# Patient Record
Sex: Male | Born: 1935
Health system: Southern US, Community
[De-identification: ages and names within clinical notes are randomized; demographics above are authoritative.]

## PROBLEM LIST (undated history)

## (undated) DIAGNOSIS — N183 Chronic kidney disease, stage 3 unspecified: Secondary | ICD-10-CM

## (undated) DIAGNOSIS — H9191 Unspecified hearing loss, right ear: Secondary | ICD-10-CM

## (undated) DIAGNOSIS — I10 Essential (primary) hypertension: Secondary | ICD-10-CM

## (undated) DIAGNOSIS — M316 Other giant cell arteritis: Secondary | ICD-10-CM

## (undated) DIAGNOSIS — I5022 Chronic systolic (congestive) heart failure: Secondary | ICD-10-CM

## (undated) DIAGNOSIS — I2510 Atherosclerotic heart disease of native coronary artery without angina pectoris: Secondary | ICD-10-CM

## (undated) DIAGNOSIS — R2689 Other abnormalities of gait and mobility: Secondary | ICD-10-CM

## (undated) DIAGNOSIS — E785 Hyperlipidemia, unspecified: Secondary | ICD-10-CM

## (undated) DIAGNOSIS — M1990 Unspecified osteoarthritis, unspecified site: Secondary | ICD-10-CM

## (undated) DIAGNOSIS — I471 Supraventricular tachycardia, unspecified: Secondary | ICD-10-CM

## (undated) DIAGNOSIS — R9431 Abnormal electrocardiogram [ECG] [EKG]: Secondary | ICD-10-CM

## (undated) DIAGNOSIS — R32 Unspecified urinary incontinence: Secondary | ICD-10-CM

## (undated) DIAGNOSIS — R319 Hematuria, unspecified: Secondary | ICD-10-CM

## (undated) HISTORY — PX: CATARACT EXTRACTION: SUR2

## (undated) HISTORY — DX: Chronic kidney disease, stage 3 unspecified: N18.30

## (undated) HISTORY — DX: Abnormal electrocardiogram (ECG) (EKG): R94.31

## (undated) HISTORY — DX: Atherosclerotic heart disease of native coronary artery without angina pectoris: I25.10

## (undated) HISTORY — DX: Hematuria, unspecified: R31.9

## (undated) HISTORY — DX: Unspecified hearing loss, right ear: H91.91

## (undated) HISTORY — DX: Chronic systolic (congestive) heart failure: I50.22

## (undated) HISTORY — DX: Supraventricular tachycardia: I47.1

## (undated) HISTORY — DX: Supraventricular tachycardia, unspecified: I47.10

## (undated) HISTORY — PX: BELPHAROPTOSIS REPAIR: SHX369

## (undated) HISTORY — PX: KNEE SURGERY: SHX244

## (undated) HISTORY — DX: Hyperlipidemia, unspecified: E78.5

---

## 2006-10-11 ENCOUNTER — Ambulatory Visit (HOSPITAL_COMMUNITY): Admission: RE | Admit: 2006-10-11 | Discharge: 2006-10-11 | Payer: Self-pay | Admitting: Family Medicine

## 2007-12-11 ENCOUNTER — Ambulatory Visit: Payer: Self-pay | Admitting: Internal Medicine

## 2007-12-19 ENCOUNTER — Ambulatory Visit: Payer: Self-pay | Admitting: Internal Medicine

## 2007-12-19 ENCOUNTER — Ambulatory Visit (HOSPITAL_COMMUNITY): Admission: RE | Admit: 2007-12-19 | Discharge: 2007-12-19 | Payer: Self-pay | Admitting: Internal Medicine

## 2007-12-19 HISTORY — PX: COLONOSCOPY: SHX174

## 2008-01-08 ENCOUNTER — Ambulatory Visit (HOSPITAL_COMMUNITY): Admission: RE | Admit: 2008-01-08 | Discharge: 2008-01-08 | Payer: Self-pay | Admitting: Family Medicine

## 2008-10-25 HISTORY — PX: BACK SURGERY: SHX140

## 2010-07-24 ENCOUNTER — Ambulatory Visit (HOSPITAL_COMMUNITY): Admission: RE | Admit: 2010-07-24 | Discharge: 2010-07-24 | Payer: Self-pay | Admitting: Family Medicine

## 2010-08-18 ENCOUNTER — Ambulatory Visit (HOSPITAL_COMMUNITY)
Admission: RE | Admit: 2010-08-18 | Discharge: 2010-08-18 | Payer: Self-pay | Source: Home / Self Care | Admitting: Family Medicine

## 2011-03-09 NOTE — Op Note (Signed)
NAME:  Gerald Hall, Gerald Hall                ACCOUNT NO.:  [id number]   MEDICAL RECORD NO.:  [id number]          PATIENT TYPE:  AMB   LOCATION:  DAY                           FACILITY:  [facility]   PHYSICIAN:  R. Roetta Sessions, M.D. DATE OF BIRTH:  02-12-1935   DATE OF PROCEDURE:  12/19/2007  DATE OF DISCHARGE:                               OPERATIVE REPORT   PROCEDURE:  High-risk screening ileal colonoscopy.   ENDOSCOPIST:  Dr. Jonathon Bellows.   INDICATIONS FOR PROCEDURE:  This 76 year old gentleman with a positive  family history of colon cancer at a relatively young age.  His last  colonoscopy was in 2002.  He was found to have external hemorrhoids  only.  He does not have any symptoms currently.  A colonoscopy is now  being done as a high-risk screening procedure.  This approach has been  discussed with the patient at length, the potential risks, benefits and  alternatives have been reviewed.  Questions answered.  Please see  documentation in the medical record for the procedure note.   DESCRIPTION OF PROCEDURE:  The O2 saturation, blood pressure, pulse and  respirations were monitored throughout the entirety of the procedure.  Conscious sedation with Versed 4 mg IV and Demerol 75 mg IV in divided  dose.  The instrument was the Pentax video-chip system.   FINDINGS:  A digital rectal exam revealed prominent external  hemorrhoids, otherwise unremarkable.  Endoscopic findings revealed the  prep was good.   COLON:  The colonic mucosa was surveyed from the rectosigmoid junction  to the left, transverse and right colon, to the area of the appendiceal  orifice, the ileocecal valve and cecum.  These structures were well seen  and photographed for the record.  The terminal ileum was then seen at 10  cm.  From this the scope was slowly withdrawn and all previously-  mentioned mucosal surfaces were again seen.  The colonic mucosa as well  as the terminal ileal mucosa appeared normal.  The scope was  pulled down  to the rectum where a thorough examination of the rectal mucosa,  including a retroflexed view of the  anal verge demonstrated no  abnormalities.   The patient tolerated the procedure well and was reactive in endoscopy.   IMPRESSION:  1. External hemorrhoids, otherwise normal rectum.  2. Normal colon.  3. Normal terminal ileum.   RECOMMENDATIONS:  Repeat screening colonoscopy in five years.      Jonathon Bellows, M.D.  Electronically Signed     RMR/MEDQ  D:  12/19/2007  T:  12/19/2007  Job:  [id number]   cc:   Reynolds Bowl, M.D.  [place], [place]

## 2011-03-09 NOTE — Consult Note (Signed)
NAME:  Gerald Hall, Gerald Hall                ACCOUNT NO.:  [id number]   MEDICAL RECORD NO.:  [id number]          PATIENT TYPE:  AMB   LOCATION:  DAY                           FACILITY:  [facility]   PHYSICIAN:  R. Roetta Sessions, M.D. DATE OF BIRTH:  12-29-1934   DATE OF CONSULTATION:  12/11/2007  DATE OF DISCHARGE:                                 CONSULTATION   REASON FOR CONSULTATION:  Family history of colon cancer.   HISTORY OF PRESENT ILLNESS:  Gerald Hall is a 76 year old male.  He has a  family history of colon cancer given the fact that his brother was  diagnosed at age 52.  His last colonoscopy was by Dr. Jena Gauss on  November 21, 2000.  He was found to have external hemorrhoids and a presumed  fissure.  He was sent to Dr. Lovell Sheehan for surgical evaluation.  He denies  any rectal bleeding or melena.  He has had left lower quadrant pain  which is intermittent and worsens with movement.  He attributes this to  his history of spinal stenosis.  He does have 4-5 loose stools a day.  This has been his baseline for 15 years or more.  He notes if he skips  his morning coffee, he is less likely to have loose stools.  He denies  any anorexia.  Denies any dysphagia or odynophagia.  Denies any  heartburn or indigestion.  His weight is up about 5 pounds in the last  year.   PAST MEDICAL/SURGICAL HISTORY:  1. Hypertension.  2. Hypercholesterolemia.  3. Spinal stenosis.  4. He was seen and treated by Dr. Lovell Sheehan for anal fissure.  5. He had a colonoscopy by Dr. Jena Gauss November 21, 2000 and was found to      have external hemorrhoids and suspected anal fissure.  6. He had a hyperplastic polyp on colonoscopy prior to this one on      July 31, 1996.   CURRENT MEDICATIONS:  1. Lipitor 10 mg daily.  2. Triamterene/HCTZ 37.5/25 mg daily.  3. Ibuprofen 800 mg b.i.d.  4. Aspirin 81 mg daily.  5. Benadryl q.h.s.   ALLERGIES:  NO KNOWN DRUG ALLERGIES.   FAMILY HISTORY:  Brother diagnosed with colon cancer in  his early 14s.  Mother deceased in her 46s secondary to coronary artery disease.  Father  deceased at 43 secondary to cerebral hemorrhage.  One sister deceased  secondary to rheumatic fever.  Three brothers deceased with history of  diabetes mellitus, colorectal carcinoma and old age.   SOCIAL HISTORY:  Gerald Hall is married.  He has 2 healthy daughters.  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  He has a 40  pack year history of tobacco use.  He occasionally consumes alcohol a  couple times per month and denies any drug use.   REVIEW OF SYSTEMS:  See HPI.   PHYSICAL EXAMINATION:  VITAL SIGNS:  Weight 190.5 pounds, 70 inches,  temperature 98.4, blood pressure 140/90 and pulse 80.  GENERAL:  He is well-developed, well-nourished elderly male in no acute  distress.  HEENT.  Sclerae are clear.  Conjunctivae are nonicteric.  Oropharynx pink and moist without any lesions.  NECK:  Supple without thyromegaly.  CHEST/HEART:  Regular rate and rhythm.  Normal S1-S2.  No rubs, clicks,  rubs or gallops.  LUNGS:  Clear to auscultation bilaterally.  ABDOMEN:  Positive bowel sounds x4.  No bruits auscultated.  Soft,  nontender and nondistended without palpable mass or hepatosplenomegaly.  No rebound, tenderness, guarding.  EXTREMITIES:  Without clubbing or edema bilaterally.   IMPRESSION:  Gerald Hall is a 76 year old male with a family history of  colon cancer overdue for high-risk screening colonoscopy.  No real GI  complaints.  He does have some occasional left lower quadrant abdominal  pain that mostly centers around his left hip, worsened with movement and  suspected to be related to his history of spinal stenosis.   PLAN:  Colonoscopy with Dr. Jena Gauss in the near future.  Discussed the  procedure including risks and benefits included but not limited to  bleeding, infection, perforation, drug reaction.  He agrees with the plan and consent will be obtained.   We would like to  thank Dr. Jorene Guest for allowing [place] to participate in  the care of Gerald Hall.      Lorenza Burton, N.P.      Jonathon Bellows, M.D.  Electronically Signed    KJ/MEDQ  D:  12/12/2007  T:  12/12/2007  Job:  [id number]

## 2011-11-02 DIAGNOSIS — N183 Chronic kidney disease, stage 3 unspecified: Secondary | ICD-10-CM | POA: Diagnosis not present

## 2011-11-02 DIAGNOSIS — I70219 Atherosclerosis of native arteries of extremities with intermittent claudication, unspecified extremity: Secondary | ICD-10-CM | POA: Diagnosis not present

## 2011-11-02 DIAGNOSIS — I10 Essential (primary) hypertension: Secondary | ICD-10-CM | POA: Diagnosis not present

## 2011-11-05 DIAGNOSIS — I739 Peripheral vascular disease, unspecified: Secondary | ICD-10-CM | POA: Diagnosis not present

## 2011-11-05 DIAGNOSIS — I700 Atherosclerosis of aorta: Secondary | ICD-10-CM | POA: Diagnosis not present

## 2011-11-05 DIAGNOSIS — F17200 Nicotine dependence, unspecified, uncomplicated: Secondary | ICD-10-CM | POA: Diagnosis not present

## 2011-11-05 DIAGNOSIS — I70219 Atherosclerosis of native arteries of extremities with intermittent claudication, unspecified extremity: Secondary | ICD-10-CM | POA: Diagnosis not present

## 2011-11-05 DIAGNOSIS — Z87891 Personal history of nicotine dependence: Secondary | ICD-10-CM | POA: Diagnosis not present

## 2011-11-08 DIAGNOSIS — I789 Disease of capillaries, unspecified: Secondary | ICD-10-CM | POA: Diagnosis not present

## 2011-11-08 DIAGNOSIS — C44319 Basal cell carcinoma of skin of other parts of face: Secondary | ICD-10-CM | POA: Diagnosis not present

## 2011-11-08 DIAGNOSIS — L905 Scar conditions and fibrosis of skin: Secondary | ICD-10-CM | POA: Diagnosis not present

## 2011-11-08 DIAGNOSIS — C44310 Basal cell carcinoma of skin of unspecified parts of face: Secondary | ICD-10-CM | POA: Insufficient documentation

## 2011-11-26 DIAGNOSIS — M48061 Spinal stenosis, lumbar region without neurogenic claudication: Secondary | ICD-10-CM | POA: Diagnosis not present

## 2011-11-26 DIAGNOSIS — M25569 Pain in unspecified knee: Secondary | ICD-10-CM | POA: Diagnosis not present

## 2011-11-30 DIAGNOSIS — M48061 Spinal stenosis, lumbar region without neurogenic claudication: Secondary | ICD-10-CM | POA: Diagnosis not present

## 2011-12-02 DIAGNOSIS — M48061 Spinal stenosis, lumbar region without neurogenic claudication: Secondary | ICD-10-CM | POA: Diagnosis not present

## 2011-12-07 DIAGNOSIS — M48061 Spinal stenosis, lumbar region without neurogenic claudication: Secondary | ICD-10-CM | POA: Diagnosis not present

## 2011-12-09 DIAGNOSIS — M48061 Spinal stenosis, lumbar region without neurogenic claudication: Secondary | ICD-10-CM | POA: Diagnosis not present

## 2011-12-14 DIAGNOSIS — M48061 Spinal stenosis, lumbar region without neurogenic claudication: Secondary | ICD-10-CM | POA: Diagnosis not present

## 2011-12-16 DIAGNOSIS — M48061 Spinal stenosis, lumbar region without neurogenic claudication: Secondary | ICD-10-CM | POA: Diagnosis not present

## 2011-12-16 DIAGNOSIS — M25569 Pain in unspecified knee: Secondary | ICD-10-CM | POA: Diagnosis not present

## 2011-12-21 DIAGNOSIS — M25569 Pain in unspecified knee: Secondary | ICD-10-CM | POA: Diagnosis not present

## 2011-12-21 DIAGNOSIS — M48061 Spinal stenosis, lumbar region without neurogenic claudication: Secondary | ICD-10-CM | POA: Diagnosis not present

## 2012-04-06 DIAGNOSIS — M48061 Spinal stenosis, lumbar region without neurogenic claudication: Secondary | ICD-10-CM | POA: Diagnosis not present

## 2012-04-06 DIAGNOSIS — M1710 Unilateral primary osteoarthritis, unspecified knee: Secondary | ICD-10-CM | POA: Diagnosis not present

## 2012-04-06 DIAGNOSIS — I10 Essential (primary) hypertension: Secondary | ICD-10-CM | POA: Diagnosis not present

## 2012-05-15 DIAGNOSIS — M659 Synovitis and tenosynovitis, unspecified: Secondary | ICD-10-CM | POA: Diagnosis not present

## 2012-05-15 DIAGNOSIS — M1710 Unilateral primary osteoarthritis, unspecified knee: Secondary | ICD-10-CM | POA: Diagnosis not present

## 2012-05-15 DIAGNOSIS — M25569 Pain in unspecified knee: Secondary | ICD-10-CM | POA: Diagnosis not present

## 2012-05-15 DIAGNOSIS — M545 Low back pain: Secondary | ICD-10-CM | POA: Diagnosis not present

## 2012-05-25 DIAGNOSIS — M659 Synovitis and tenosynovitis, unspecified: Secondary | ICD-10-CM | POA: Diagnosis not present

## 2012-05-25 DIAGNOSIS — M25569 Pain in unspecified knee: Secondary | ICD-10-CM | POA: Diagnosis not present

## 2012-05-25 DIAGNOSIS — M1710 Unilateral primary osteoarthritis, unspecified knee: Secondary | ICD-10-CM | POA: Diagnosis not present

## 2012-07-26 DIAGNOSIS — Z23 Encounter for immunization: Secondary | ICD-10-CM | POA: Diagnosis not present

## 2012-08-25 DIAGNOSIS — N400 Enlarged prostate without lower urinary tract symptoms: Secondary | ICD-10-CM | POA: Diagnosis not present

## 2012-10-24 DIAGNOSIS — H2510 Age-related nuclear cataract, unspecified eye: Secondary | ICD-10-CM | POA: Diagnosis not present

## 2012-11-23 ENCOUNTER — Encounter: Payer: Self-pay | Admitting: Internal Medicine

## 2012-12-04 DIAGNOSIS — Z87891 Personal history of nicotine dependence: Secondary | ICD-10-CM | POA: Diagnosis not present

## 2012-12-04 DIAGNOSIS — I70219 Atherosclerosis of native arteries of extremities with intermittent claudication, unspecified extremity: Secondary | ICD-10-CM | POA: Diagnosis not present

## 2012-12-04 DIAGNOSIS — I10 Essential (primary) hypertension: Secondary | ICD-10-CM | POA: Diagnosis not present

## 2012-12-26 DIAGNOSIS — M25569 Pain in unspecified knee: Secondary | ICD-10-CM | POA: Diagnosis not present

## 2012-12-26 DIAGNOSIS — M1710 Unilateral primary osteoarthritis, unspecified knee: Secondary | ICD-10-CM | POA: Diagnosis not present

## 2012-12-26 DIAGNOSIS — M659 Synovitis and tenosynovitis, unspecified: Secondary | ICD-10-CM | POA: Diagnosis not present

## 2013-01-09 DIAGNOSIS — M25569 Pain in unspecified knee: Secondary | ICD-10-CM | POA: Diagnosis not present

## 2013-01-09 DIAGNOSIS — M659 Synovitis and tenosynovitis, unspecified: Secondary | ICD-10-CM | POA: Diagnosis not present

## 2013-09-03 DIAGNOSIS — Z23 Encounter for immunization: Secondary | ICD-10-CM | POA: Diagnosis not present

## 2013-09-11 ENCOUNTER — Ambulatory Visit (INDEPENDENT_AMBULATORY_CARE_PROVIDER_SITE_OTHER): Payer: Medicare Other | Admitting: Family Medicine

## 2013-09-11 ENCOUNTER — Encounter: Payer: Self-pay | Admitting: Family Medicine

## 2013-09-11 VITALS — BP 130/74 | Ht 69.5 in | Wt 174.0 lb

## 2013-09-11 DIAGNOSIS — Z0000 Encounter for general adult medical examination without abnormal findings: Secondary | ICD-10-CM

## 2013-09-11 DIAGNOSIS — Z79899 Other long term (current) drug therapy: Secondary | ICD-10-CM

## 2013-09-11 DIAGNOSIS — F17200 Nicotine dependence, unspecified, uncomplicated: Secondary | ICD-10-CM | POA: Diagnosis not present

## 2013-09-11 DIAGNOSIS — E782 Mixed hyperlipidemia: Secondary | ICD-10-CM

## 2013-09-11 MED ORDER — GABAPENTIN 100 MG PO CAPS: 100.0000 mg | ORAL_CAPSULE | Freq: Two times a day (BID) | ORAL | Status: DC

## 2013-09-11 MED ORDER — FINASTERIDE 5 MG PO TABS: 5.0000 mg | ORAL_TABLET | Freq: Every day | ORAL | Status: DC

## 2013-09-11 MED ORDER — TRIAMTERENE-HCTZ 37.5-25 MG PO CAPS: 1.0000 | ORAL_CAPSULE | Freq: Every day | ORAL | Status: DC

## 2013-09-11 MED ORDER — FLUTICASONE PROPIONATE 50 MCG/ACT NA SUSP: 2.0000 | Freq: Every day | NASAL | Status: DC

## 2013-09-11 NOTE — Progress Notes (Signed)
**Note Gerald-Identified via Obfuscation**   Subjective:    Patient ID: Gerald Hall, male    DOB: [birth year], 78 y.o.   MRN: [id number]  HPIHere for a med check. Needs refills. Discuss side effects of pravastatin.  Patient had a colonoscopy about 5 years ago is due for another colonoscopy due to increased family risk he states he will contact Dr. Kendell Bane  Hx spinal stenosis Hx hyperlipidemia Stopped pravastatin-  Possibly better with memory No hx MI  Review of Systems  Constitutional: Negative for fever, activity change, appetite change and fatigue.  HENT: Negative for rhinorrhea and sinus pressure.   Respiratory: Negative for cough, choking and chest tightness.   Cardiovascular: Negative for chest pain.  Gastrointestinal: Negative for abdominal pain.  Hematological: Negative for adenopathy.       Objective:   Physical Exam  Vitals reviewed. Constitutional: He appears well-developed and well-nourished.  HENT:  Head: Normocephalic and atraumatic.  Neck: Neck supple.  Cardiovascular: Normal rate, regular rhythm and normal heart sounds.   No murmur heard. Pulmonary/Chest: Effort normal and breath sounds normal. No respiratory distress.  Abdominal: Soft.  Musculoskeletal: Normal range of motion.  Lymphadenopathy:    He has no cervical adenopathy.  Neurological: He is alert.  Skin: Skin is warm and dry. No erythema.          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

## 2013-09-13 DIAGNOSIS — E782 Mixed hyperlipidemia: Secondary | ICD-10-CM | POA: Diagnosis not present

## 2013-09-13 DIAGNOSIS — Z79899 Other long term (current) drug therapy: Secondary | ICD-10-CM | POA: Diagnosis not present

## 2013-09-14 LAB — BASIC METABOLIC PANEL
BUN: 20 mg/dL (ref 6–23)
Chloride: 102 mEq/L (ref 96–112)
Creat: 1.15 mg/dL (ref 0.50–1.35)
Potassium: 3.4 mEq/L — ABNORMAL LOW (ref 3.5–5.3)
Sodium: 138 mEq/L (ref 135–145)

## 2013-09-14 LAB — HEPATIC FUNCTION PANEL
ALT: 11 U/L (ref 0–53)
Albumin: 3.7 g/dL (ref 3.5–5.2)
Alkaline Phosphatase: 75 U/L (ref 39–117)
Indirect Bilirubin: 0.6 mg/dL (ref 0.0–0.9)
Total Bilirubin: 0.8 mg/dL (ref 0.3–1.2)
Total Protein: 6.3 g/dL (ref 6.0–8.3)

## 2013-09-14 LAB — LIPID PANEL
Cholesterol: 165 mg/dL (ref 0–200)
Triglycerides: 137 mg/dL (ref <150)
VLDL: 27 mg/dL (ref 0–40)

## 2013-09-24 ENCOUNTER — Other Ambulatory Visit: Payer: Self-pay

## 2013-09-24 DIAGNOSIS — R109 Unspecified abdominal pain: Secondary | ICD-10-CM

## 2013-09-24 MED ORDER — POTASSIUM CHLORIDE ER 10 MEQ PO TBCR: 10.0000 meq | EXTENDED_RELEASE_TABLET | Freq: Every day | ORAL | Status: DC

## 2013-09-27 ENCOUNTER — Ambulatory Visit (HOSPITAL_COMMUNITY)
Admission: RE | Admit: 2013-09-27 | Discharge: 2013-09-27 | Disposition: A | Discharge status: Home / Self Care | Payer: Medicare Other | Source: Ambulatory Visit | Attending: Family Medicine | Admitting: Family Medicine

## 2013-09-27 ENCOUNTER — Other Ambulatory Visit: Payer: Self-pay | Admitting: Family Medicine

## 2013-09-27 DIAGNOSIS — R109 Unspecified abdominal pain: Secondary | ICD-10-CM

## 2013-09-27 DIAGNOSIS — E785 Hyperlipidemia, unspecified: Secondary | ICD-10-CM | POA: Diagnosis not present

## 2013-09-27 DIAGNOSIS — Z136 Encounter for screening for cardiovascular disorders: Secondary | ICD-10-CM | POA: Diagnosis not present

## 2013-09-27 DIAGNOSIS — Z87891 Personal history of nicotine dependence: Secondary | ICD-10-CM | POA: Diagnosis not present

## 2013-12-21 DIAGNOSIS — B351 Tinea unguium: Secondary | ICD-10-CM | POA: Diagnosis not present

## 2014-01-29 ENCOUNTER — Other Ambulatory Visit: Payer: Self-pay | Admitting: Ophthalmology

## 2014-01-29 DIAGNOSIS — H2510 Age-related nuclear cataract, unspecified eye: Secondary | ICD-10-CM | POA: Diagnosis not present

## 2014-01-29 DIAGNOSIS — G4452 New daily persistent headache (NDPH): Secondary | ICD-10-CM | POA: Diagnosis not present

## 2014-01-29 LAB — SEDIMENTATION RATE: Erythrocyte Sed Rate: 5 mm/hr (ref 0–20)

## 2014-01-31 ENCOUNTER — Ambulatory Visit (INDEPENDENT_AMBULATORY_CARE_PROVIDER_SITE_OTHER): Payer: Medicare Other | Admitting: Family Medicine

## 2014-01-31 ENCOUNTER — Encounter: Payer: Self-pay | Admitting: Family Medicine

## 2014-01-31 VITALS — BP 128/88 | Temp 98.4°F | Ht 69.5 in | Wt 173.0 lb

## 2014-01-31 DIAGNOSIS — R519 Headache, unspecified: Secondary | ICD-10-CM

## 2014-01-31 DIAGNOSIS — R51 Headache: Secondary | ICD-10-CM

## 2014-01-31 DIAGNOSIS — J322 Chronic ethmoidal sinusitis: Secondary | ICD-10-CM | POA: Diagnosis not present

## 2014-01-31 MED ORDER — CEFPROZIL 500 MG PO TABS: 500.0000 mg | ORAL_TABLET | Freq: Two times a day (BID) | ORAL | Status: DC

## 2014-01-31 NOTE — Progress Notes (Signed)
   Subjective:    Patient ID: Gerald Hall, male    DOB: 1935/09/02, 79 y.o.   MRN: [id number]  Sinusitis This is a new problem. Episode onset: 2 weeks ago. The problem has been waxing and waning since onset. There has been no fever. Associated symptoms include congestion and headaches. (Pressure behind left eye) Past treatments include acetaminophen and spray decongestants. The treatment provided mild relief.   No N or V Patient states that he's been having headaches that last anywhere from 20 minutes to an hour at a time start gradually then they become severe to where it's. Call him for him to function he denies any nausea or vomiting with it. He does states that it has woken him up at night he attributed to his sinuses. He has never had headaches before he denies any unilateral numbness or weakness  Review of Systems  HENT: Positive for congestion.   Neurological: Positive for headaches.   no cough wheezing the nurse.     Objective:   Physical Exam  EOMI cranial nerves normal neck is supple lungs are clear heart is regular subjective discomfort in the frontal sinus on the left side eardrums normal heart regular      Assessment & Plan:  #1 headaches-this could be temporal arteritis but the patient states that his ophthalmologist specialist did a sedimentation rate that was normal I recommend repeating a sedimentation rate again next week. He will get that done either through the ophthalmologist or through [place].  #2 possible sinusitis switched to Cefzil 500 mg twice a day for the next 10 days he  #3 I do not recommend this patient followup in one week's time if he is not significantly improved because his headaches are persistent there are new onset they've been present for the past 2 weeks and are starting to occur at nighttime I would recommend that we do S. MRI on this patient. Currently we will see if this will get better with antibiotics she will followup in one week if the pain  goes away before then he will not need a scan of the does not go away he will need a scan he understands. He understands that this could be a sinus infection temporal arteritis or the possibility of a tumor he understands all of this.

## 2014-02-04 DIAGNOSIS — R05 Cough: Secondary | ICD-10-CM | POA: Diagnosis not present

## 2014-02-04 DIAGNOSIS — R059 Cough, unspecified: Secondary | ICD-10-CM | POA: Diagnosis not present

## 2014-02-04 DIAGNOSIS — M316 Other giant cell arteritis: Secondary | ICD-10-CM | POA: Diagnosis not present

## 2014-02-06 DIAGNOSIS — G4452 New daily persistent headache (NDPH): Secondary | ICD-10-CM | POA: Diagnosis not present

## 2014-02-06 DIAGNOSIS — M316 Other giant cell arteritis: Secondary | ICD-10-CM | POA: Diagnosis not present

## 2014-02-06 DIAGNOSIS — R51 Headache: Secondary | ICD-10-CM | POA: Diagnosis not present

## 2014-02-08 ENCOUNTER — Ambulatory Visit: Payer: Self-pay | Admitting: Family Medicine

## 2014-02-12 DIAGNOSIS — R51 Headache: Secondary | ICD-10-CM | POA: Diagnosis not present

## 2014-02-12 DIAGNOSIS — R7982 Elevated C-reactive protein (CRP): Secondary | ICD-10-CM | POA: Diagnosis not present

## 2014-02-12 DIAGNOSIS — M316 Other giant cell arteritis: Secondary | ICD-10-CM | POA: Diagnosis not present

## 2014-02-13 ENCOUNTER — Telehealth: Payer: Self-pay | Admitting: Family Medicine

## 2014-02-13 DIAGNOSIS — L570 Actinic keratosis: Secondary | ICD-10-CM | POA: Diagnosis not present

## 2014-02-13 DIAGNOSIS — Z1283 Encounter for screening for malignant neoplasm of skin: Secondary | ICD-10-CM | POA: Diagnosis not present

## 2014-02-13 DIAGNOSIS — Z85828 Personal history of other malignant neoplasm of skin: Secondary | ICD-10-CM | POA: Diagnosis not present

## 2014-02-13 NOTE — Telephone Encounter (Signed)
Pt would like to speak with Dr. Nicki Reaper concerning a recent biopsy he had and scheduling a MRI

## 2014-02-14 NOTE — Telephone Encounter (Signed)
First Bx inconclusive, 2nd Bx pending. Pt will call, if 2nd neg then MRI. Pt aware

## 2014-02-15 ENCOUNTER — Telehealth: Payer: Self-pay | Admitting: Family Medicine

## 2014-02-15 DIAGNOSIS — R3129 Other microscopic hematuria: Secondary | ICD-10-CM | POA: Diagnosis not present

## 2014-02-15 DIAGNOSIS — N400 Enlarged prostate without lower urinary tract symptoms: Secondary | ICD-10-CM | POA: Diagnosis not present

## 2014-02-15 DIAGNOSIS — N281 Cyst of kidney, acquired: Secondary | ICD-10-CM | POA: Diagnosis not present

## 2014-02-15 DIAGNOSIS — R51 Headache: Secondary | ICD-10-CM

## 2014-02-15 NOTE — Telephone Encounter (Signed)
Please set this patient up for MRI. Talk with patient when would be best for him. MRI of the brain reason  severe headaches over the past 4 weeks. Wakes him up at nighttime.

## 2014-02-15 NOTE — Telephone Encounter (Signed)
Pt calling to let you know 2nd biopsy came back inconclusive like the first one. Would like to go ahead and schedule MRI. Can go any day except Wednesday.

## 2014-02-15 NOTE — Telephone Encounter (Signed)
Patient has requested that Dr Nicki Reaper call him back regarding scheduling an MRI.

## 2014-02-15 NOTE — Telephone Encounter (Signed)
MRI brain without contrast scheduled [facility] 02/21/14 at 11 am. Register 10:45. Pt notified.

## 2014-02-21 ENCOUNTER — Ambulatory Visit (HOSPITAL_COMMUNITY)
Admission: RE | Admit: 2014-02-21 | Discharge: 2014-02-21 | Disposition: A | Discharge status: Home / Self Care | Payer: Medicare Other | Source: Ambulatory Visit | Attending: Family Medicine | Admitting: Family Medicine

## 2014-02-21 DIAGNOSIS — R51 Headache: Secondary | ICD-10-CM | POA: Diagnosis not present

## 2014-02-21 DIAGNOSIS — I6789 Other cerebrovascular disease: Secondary | ICD-10-CM | POA: Insufficient documentation

## 2014-02-22 NOTE — Progress Notes (Signed)
Patient notified and verbalized understanding of the test results. No further questions. 

## 2014-02-28 ENCOUNTER — Telehealth: Payer: Self-pay | Admitting: Family Medicine

## 2014-02-28 NOTE — Telephone Encounter (Signed)
Patient needs a copy of his MRI faxed that he had last Thursday.  Dr. Leeanne Mannan. Fax # [phone]

## 2014-03-01 DIAGNOSIS — M79609 Pain in unspecified limb: Secondary | ICD-10-CM | POA: Diagnosis not present

## 2014-03-01 DIAGNOSIS — B351 Tinea unguium: Secondary | ICD-10-CM | POA: Diagnosis not present

## 2014-03-01 DIAGNOSIS — Q828 Other specified congenital malformations of skin: Secondary | ICD-10-CM | POA: Diagnosis not present

## 2014-03-05 DIAGNOSIS — M519 Unspecified thoracic, thoracolumbar and lumbosacral intervertebral disc disorder: Secondary | ICD-10-CM | POA: Insufficient documentation

## 2014-03-05 DIAGNOSIS — M316 Other giant cell arteritis: Secondary | ICD-10-CM | POA: Diagnosis not present

## 2014-04-08 DIAGNOSIS — M316 Other giant cell arteritis: Secondary | ICD-10-CM | POA: Diagnosis not present

## 2014-04-09 ENCOUNTER — Other Ambulatory Visit: Payer: Self-pay | Admitting: Family Medicine

## 2014-04-17 ENCOUNTER — Ambulatory Visit (INDEPENDENT_AMBULATORY_CARE_PROVIDER_SITE_OTHER): Payer: Medicare Other | Admitting: Family Medicine

## 2014-04-17 ENCOUNTER — Encounter: Payer: Self-pay | Admitting: Family Medicine

## 2014-04-17 VITALS — BP 136/88 | Temp 98.4°F | Ht 69.5 in | Wt 179.0 lb

## 2014-04-17 DIAGNOSIS — J301 Allergic rhinitis due to pollen: Secondary | ICD-10-CM | POA: Diagnosis not present

## 2014-04-17 DIAGNOSIS — I10 Essential (primary) hypertension: Secondary | ICD-10-CM | POA: Insufficient documentation

## 2014-04-17 DIAGNOSIS — G4700 Insomnia, unspecified: Secondary | ICD-10-CM | POA: Diagnosis not present

## 2014-04-17 DIAGNOSIS — G2581 Restless legs syndrome: Secondary | ICD-10-CM

## 2014-04-17 DIAGNOSIS — M316 Other giant cell arteritis: Secondary | ICD-10-CM | POA: Insufficient documentation

## 2014-04-17 MED ORDER — LORAZEPAM 1 MG PO TABS: ORAL_TABLET | ORAL | Status: DC

## 2014-04-17 NOTE — Progress Notes (Signed)
   Subjective:    Patient ID: Gerald Hall, male    DOB: 11/01/1934, 79 y.o.   MRN: [id number]  HPIHyperlipidema. Patient takes meds every day. Patient states he does not exercise and he is taking prednisone 30mg  every day so he eats what he wants to eat.   Sneezing, sinus drainage. Started months ago. Not taking anything for it.  He denies wheezing chest tightness He does smoke he knows he needs to quit Review of Systems He denies chest tightness pressure pain shortness of breath.    Objective:   Physical Exam  Lungs are clear hearts regular neck no masses pulses are normal. Eardrums normal. Throat normal.      Assessment & Plan:  Allergic rhinitis he ought to be going ahead with allergy medicine on a regular basis. Nasonex as needed.  #2 restless legs-Ativan 1 mg either a half or whole at nighttime when necessary when it keeps him from sleeping  #3 insomnia Ativan as discussed above to use infrequently  #4 hyperlipidemia check lipid profile later this year  Followup in 4 months lab work at that time

## 2014-05-09 DIAGNOSIS — M316 Other giant cell arteritis: Secondary | ICD-10-CM | POA: Diagnosis not present

## 2014-05-10 DIAGNOSIS — Q828 Other specified congenital malformations of skin: Secondary | ICD-10-CM | POA: Diagnosis not present

## 2014-05-10 DIAGNOSIS — M79609 Pain in unspecified limb: Secondary | ICD-10-CM | POA: Diagnosis not present

## 2014-05-24 DIAGNOSIS — H2510 Age-related nuclear cataract, unspecified eye: Secondary | ICD-10-CM | POA: Diagnosis not present

## 2014-06-04 ENCOUNTER — Other Ambulatory Visit: Payer: Self-pay

## 2014-06-04 MED ORDER — TRAMADOL HCL 50 MG PO TABS: 50.0000 mg | ORAL_TABLET | Freq: Three times a day (TID) | ORAL | Status: DC | PRN

## 2014-06-13 DIAGNOSIS — M353 Polymyalgia rheumatica: Secondary | ICD-10-CM | POA: Diagnosis not present

## 2014-07-17 ENCOUNTER — Ambulatory Visit: Payer: Self-pay | Admitting: Ophthalmology

## 2014-07-17 DIAGNOSIS — Z01810 Encounter for preprocedural cardiovascular examination: Secondary | ICD-10-CM | POA: Diagnosis not present

## 2014-07-17 DIAGNOSIS — I10 Essential (primary) hypertension: Secondary | ICD-10-CM | POA: Diagnosis not present

## 2014-07-17 DIAGNOSIS — H2510 Age-related nuclear cataract, unspecified eye: Secondary | ICD-10-CM | POA: Diagnosis not present

## 2014-07-17 DIAGNOSIS — Z01812 Encounter for preprocedural laboratory examination: Secondary | ICD-10-CM | POA: Diagnosis not present

## 2014-07-17 DIAGNOSIS — Z7901 Long term (current) use of anticoagulants: Secondary | ICD-10-CM | POA: Diagnosis not present

## 2014-07-17 LAB — PROTIME-INR
INR: 0.9
Prothrombin Time: 12.2 secs (ref 11.5–14.7)

## 2014-07-19 DIAGNOSIS — M353 Polymyalgia rheumatica: Secondary | ICD-10-CM | POA: Diagnosis not present

## 2014-07-19 DIAGNOSIS — B351 Tinea unguium: Secondary | ICD-10-CM | POA: Diagnosis not present

## 2014-07-19 DIAGNOSIS — M316 Other giant cell arteritis: Secondary | ICD-10-CM | POA: Diagnosis not present

## 2014-07-19 DIAGNOSIS — Q828 Other specified congenital malformations of skin: Secondary | ICD-10-CM | POA: Diagnosis not present

## 2014-07-30 ENCOUNTER — Ambulatory Visit: Payer: Self-pay | Admitting: Ophthalmology

## 2014-07-30 DIAGNOSIS — H269 Unspecified cataract: Secondary | ICD-10-CM | POA: Diagnosis not present

## 2014-07-30 DIAGNOSIS — I10 Essential (primary) hypertension: Secondary | ICD-10-CM | POA: Diagnosis not present

## 2014-07-30 DIAGNOSIS — Z7982 Long term (current) use of aspirin: Secondary | ICD-10-CM | POA: Diagnosis not present

## 2014-07-30 DIAGNOSIS — H2511 Age-related nuclear cataract, right eye: Secondary | ICD-10-CM | POA: Diagnosis not present

## 2014-08-09 DIAGNOSIS — H2512 Age-related nuclear cataract, left eye: Secondary | ICD-10-CM | POA: Diagnosis not present

## 2014-08-10 ENCOUNTER — Other Ambulatory Visit: Payer: Self-pay | Admitting: Family Medicine

## 2014-08-13 ENCOUNTER — Ambulatory Visit: Payer: Self-pay | Admitting: Ophthalmology

## 2014-08-13 DIAGNOSIS — I10 Essential (primary) hypertension: Secondary | ICD-10-CM | POA: Diagnosis not present

## 2014-08-13 DIAGNOSIS — F17200 Nicotine dependence, unspecified, uncomplicated: Secondary | ICD-10-CM | POA: Diagnosis not present

## 2014-08-13 DIAGNOSIS — H269 Unspecified cataract: Secondary | ICD-10-CM | POA: Diagnosis not present

## 2014-08-13 DIAGNOSIS — H2512 Age-related nuclear cataract, left eye: Secondary | ICD-10-CM | POA: Diagnosis not present

## 2014-08-16 ENCOUNTER — Ambulatory Visit: Payer: Self-pay | Admitting: Family Medicine

## 2014-08-26 DIAGNOSIS — M353 Polymyalgia rheumatica: Secondary | ICD-10-CM | POA: Diagnosis not present

## 2014-09-03 ENCOUNTER — Encounter: Payer: Self-pay | Admitting: Family Medicine

## 2014-09-03 ENCOUNTER — Ambulatory Visit (INDEPENDENT_AMBULATORY_CARE_PROVIDER_SITE_OTHER): Payer: Medicare Other | Admitting: Family Medicine

## 2014-09-03 VITALS — BP 130/82 | Wt 179.0 lb

## 2014-09-03 DIAGNOSIS — R5383 Other fatigue: Secondary | ICD-10-CM

## 2014-09-03 DIAGNOSIS — G4700 Insomnia, unspecified: Secondary | ICD-10-CM

## 2014-09-03 DIAGNOSIS — Z79899 Other long term (current) drug therapy: Secondary | ICD-10-CM | POA: Diagnosis not present

## 2014-09-03 DIAGNOSIS — E782 Mixed hyperlipidemia: Secondary | ICD-10-CM

## 2014-09-03 DIAGNOSIS — Z23 Encounter for immunization: Secondary | ICD-10-CM | POA: Diagnosis not present

## 2014-09-03 MED ORDER — LORAZEPAM 1 MG PO TABS: ORAL_TABLET | ORAL | Status: DC

## 2014-09-03 NOTE — Progress Notes (Signed)
   Subjective:    Patient ID: Loney Domingo, male    DOB: [birth year], 79 y.o.   MRN: [id number]  Hypertension This is a chronic problem. The current episode started more than 1 year ago. Compliance problems include exercise.    Having back pain, right side pain, right leg pain and groin pain on right side. Pain comes and goes for the past couple months. Getting worse. Will get flu vaccine at drug store.  Needs refill on ativan.    Review of Systems Denies chest tightness pressure pain shortness breath nausea vomiting diarrhea    Objective:   Physical Exam  Lungs clear heart regular neck no masses pulses normal extremities no edema skin warm dry      Assessment & Plan:  #1 HTN-continue current measures, check metabolic 7 #2 hyperlipidemia lipid profile #3 fatigue check CBC #4 encourage patient quit smoking #5 pneumococcal vaccine

## 2014-09-06 DIAGNOSIS — Z79899 Other long term (current) drug therapy: Secondary | ICD-10-CM | POA: Diagnosis not present

## 2014-09-06 DIAGNOSIS — E782 Mixed hyperlipidemia: Secondary | ICD-10-CM | POA: Diagnosis not present

## 2014-09-06 DIAGNOSIS — R5383 Other fatigue: Secondary | ICD-10-CM | POA: Diagnosis not present

## 2014-09-06 LAB — CBC WITH DIFFERENTIAL/PLATELET
BASOS ABS: 0 10*3/uL (ref 0.0–0.1)
BASOS PCT: 0 % (ref 0–1)
Eosinophils Absolute: 0.1 10*3/uL (ref 0.0–0.7)
Eosinophils Relative: 1 % (ref 0–5)
HCT: 47.4 % (ref 39.0–52.0)
HEMOGLOBIN: 16.4 g/dL (ref 13.0–17.0)
Lymphocytes Relative: 11 % — ABNORMAL LOW (ref 12–46)
Lymphs Abs: 1.6 10*3/uL (ref 0.7–4.0)
MCH: 31.7 pg (ref 26.0–34.0)
MCHC: 34.6 g/dL (ref 30.0–36.0)
MCV: 91.7 fL (ref 78.0–100.0)
MONOS PCT: 10 % (ref 3–12)
Monocytes Absolute: 1.4 10*3/uL — ABNORMAL HIGH (ref 0.1–1.0)
NEUTROS ABS: 11.1 10*3/uL — AB (ref 1.7–7.7)
NEUTROS PCT: 78 % — AB (ref 43–77)
Platelets: 227 10*3/uL (ref 150–400)
RBC: 5.17 MIL/uL (ref 4.22–5.81)
RDW: 13.7 % (ref 11.5–15.5)
WBC: 14.2 10*3/uL — ABNORMAL HIGH (ref 4.0–10.5)

## 2014-09-07 ENCOUNTER — Encounter: Payer: Self-pay | Admitting: Family Medicine

## 2014-09-07 LAB — BASIC METABOLIC PANEL
BUN: 17 mg/dL (ref 6–23)
CALCIUM: 9.8 mg/dL (ref 8.4–10.5)
CO2: 29 meq/L (ref 19–32)
CREATININE: 1.14 mg/dL (ref 0.50–1.35)
Chloride: 101 mEq/L (ref 96–112)
Glucose, Bld: 90 mg/dL (ref 70–99)
Potassium: 3.5 mEq/L (ref 3.5–5.3)
Sodium: 140 mEq/L (ref 135–145)

## 2014-09-07 LAB — LIPID PANEL
Cholesterol: 171 mg/dL (ref 0–200)
HDL: 46 mg/dL (ref >39)
LDL CALC: 96 mg/dL (ref 0–99)
TRIGLYCERIDES: 145 mg/dL (ref <150)
Total CHOL/HDL Ratio: 3.7 Ratio
VLDL: 29 mg/dL (ref 0–40)

## 2014-10-03 DIAGNOSIS — M353 Polymyalgia rheumatica: Secondary | ICD-10-CM | POA: Diagnosis not present

## 2014-10-09 ENCOUNTER — Other Ambulatory Visit: Payer: Self-pay | Admitting: Family Medicine

## 2014-10-25 DIAGNOSIS — M316 Other giant cell arteritis: Secondary | ICD-10-CM

## 2014-10-25 HISTORY — DX: Other giant cell arteritis: M31.6

## 2014-11-01 DIAGNOSIS — B351 Tinea unguium: Secondary | ICD-10-CM | POA: Diagnosis not present

## 2014-11-01 DIAGNOSIS — Q828 Other specified congenital malformations of skin: Secondary | ICD-10-CM | POA: Diagnosis not present

## 2014-11-11 DIAGNOSIS — M353 Polymyalgia rheumatica: Secondary | ICD-10-CM | POA: Diagnosis not present

## 2014-11-20 ENCOUNTER — Telehealth: Payer: Self-pay

## 2014-11-20 ENCOUNTER — Other Ambulatory Visit: Payer: Self-pay

## 2014-11-20 ENCOUNTER — Ambulatory Visit (INDEPENDENT_AMBULATORY_CARE_PROVIDER_SITE_OTHER): Payer: Medicare Other | Admitting: Nurse Practitioner

## 2014-11-20 ENCOUNTER — Encounter: Payer: Self-pay | Admitting: Nurse Practitioner

## 2014-11-20 VITALS — BP 151/86 | HR 75 | Temp 96.0°F | Ht 69.5 in | Wt 177.2 lb

## 2014-11-20 DIAGNOSIS — Z1211 Encounter for screening for malignant neoplasm of colon: Secondary | ICD-10-CM

## 2014-11-20 NOTE — Telephone Encounter (Signed)
Gastroenterology Pre-Procedure Review  Request Date: 11/20/2014 Requesting Physician:  HAD BEEN ON RECALL/ PAST DUE FOR NEXT COLONOSCOPY Last done 12/19/2007 by Dr. Gala Romney  FAMILY HX OF COLON CANCER IN HIS BROTHER / DIAGNOSED IN EARLY 60'S/ LIVED ABOUT ONE AND A HALF YEARS  PATIENT REVIEW QUESTIONS: The patient responded to the following health history questions as indicated:    1. Diabetes Melitis: no 2. Joint replacements in the past 12 months: no 3. Major health problems in the past 3 months: no 4. Has an artificial valve or MVP: no 5. Has a defibrillator: no 6. Has been advised in past to take antibiotics in advance of a procedure like teeth cleaning: no    MEDICATIONS & ALLERGIES:    Patient reports the following regarding taking any blood thinners:   Plavix? no Aspirin? YES Coumadin? no  Patient confirms/reports the following medications:  Current Outpatient Prescriptions  Medication Sig Dispense Refill  . aspirin 81 MG tablet Take 81 mg by mouth daily.    . finasteride (PROSCAR) 5 MG tablet TAKE 1 TABLET DAILY 30 tablet 1  . fluticasone (FLONASE) 50 MCG/ACT nasal spray INHALE 2 PUFFS IN EACH NOSTRIL DAILY 16 g 1  . LORazepam (ATIVAN) 1 MG tablet 1/2 to 1 qhs prn insomnia 30 tablet 5  . potassium chloride (K-DUR) 10 MEQ tablet TAKE 1 TABLET DAILY 30 tablet 1  . predniSONE (DELTASONE) 5 MG tablet Take by mouth. Taking 2.5 mg daily now    . traMADol (ULTRAM) 50 MG tablet Take 1 tablet (50 mg total) by mouth 3 (three) times daily as needed. (Patient taking differently: Take 50 mg by mouth 2 (two) times daily. ) 60 tablet 5  . triamterene-hydrochlorothiazide (MAXZIDE-25) 37.5-25 MG per tablet TAKE 1 TABLET DAILY 30 tablet 5  . Difluprednate (DUREZOL) 0.05 % EMUL     . Nepafenac (ILEVRO) 0.3 % SUSP      No current facility-administered medications for this visit.    Patient confirms/reports the following allergies:  No Known Allergies  No orders of the defined types were  placed in this encounter.    AUTHORIZATION INFORMATION Primary Insurance:   ID #:   Group #:  Pre-Cert / Auth required: Pre-Cert / Auth #:   Secondary Insurance:   ID #:   Group #:  Pre-Cert / Auth required:  Pre-Cert / Auth #:   SCHEDULE INFORMATION: Procedure has been scheduled as follows:  Date: 12/13/2014           Time:11:15 AM   Location: [facility] Short Stay  This Gastroenterology Pre-Precedure Review Form is being routed to the following provider(s): R. Garfield Cornea, MD

## 2014-11-21 DIAGNOSIS — Z1211 Encounter for screening for malignant neoplasm of colon: Secondary | ICD-10-CM | POA: Insufficient documentation

## 2014-11-21 MED ORDER — PEG-KCL-NACL-NASULF-NA ASC-C 100 G PO SOLR: 1.0000 | ORAL | Status: DC

## 2014-11-21 NOTE — Telephone Encounter (Signed)
Ok to schedule. No need to hold aspirin. Thanks

## 2014-11-21 NOTE — Progress Notes (Signed)
Opened in error. Patient appropriate for phone triage which was completed in person today. No charge.

## 2014-11-21 NOTE — Telephone Encounter (Signed)
Rx sent to the pharmacy and instructions were given to pt in the office.

## 2014-12-13 ENCOUNTER — Ambulatory Visit (HOSPITAL_COMMUNITY)
Admission: RE | Admit: 2014-12-13 | Discharge: 2014-12-13 | Disposition: A | Discharge status: Home / Self Care | Payer: Medicare Other | Source: Ambulatory Visit | Attending: Internal Medicine | Admitting: Internal Medicine

## 2014-12-13 ENCOUNTER — Encounter (HOSPITAL_COMMUNITY): Payer: Self-pay | Admitting: *Deleted

## 2014-12-13 ENCOUNTER — Encounter (HOSPITAL_COMMUNITY)
Admission: RE | Disposition: A | Discharge status: Home / Self Care | Payer: Self-pay | Source: Ambulatory Visit | Attending: Internal Medicine

## 2014-12-13 DIAGNOSIS — Z800 Family history of malignant neoplasm of digestive organs: Secondary | ICD-10-CM | POA: Insufficient documentation

## 2014-12-13 DIAGNOSIS — Z9889 Other specified postprocedural states: Secondary | ICD-10-CM | POA: Diagnosis not present

## 2014-12-13 DIAGNOSIS — R8589 Other abnormal findings in specimens from digestive organs and abdominal cavity: Secondary | ICD-10-CM | POA: Insufficient documentation

## 2014-12-13 DIAGNOSIS — Z79891 Long term (current) use of opiate analgesic: Secondary | ICD-10-CM | POA: Diagnosis not present

## 2014-12-13 DIAGNOSIS — Z79899 Other long term (current) drug therapy: Secondary | ICD-10-CM | POA: Insufficient documentation

## 2014-12-13 DIAGNOSIS — K6389 Other specified diseases of intestine: Secondary | ICD-10-CM | POA: Diagnosis not present

## 2014-12-13 DIAGNOSIS — Z1211 Encounter for screening for malignant neoplasm of colon: Secondary | ICD-10-CM | POA: Diagnosis present

## 2014-12-13 DIAGNOSIS — F17210 Nicotine dependence, cigarettes, uncomplicated: Secondary | ICD-10-CM | POA: Insufficient documentation

## 2014-12-13 DIAGNOSIS — Z7982 Long term (current) use of aspirin: Secondary | ICD-10-CM | POA: Diagnosis not present

## 2014-12-13 DIAGNOSIS — K644 Residual hemorrhoidal skin tags: Secondary | ICD-10-CM | POA: Insufficient documentation

## 2014-12-13 DIAGNOSIS — H9191 Unspecified hearing loss, right ear: Secondary | ICD-10-CM | POA: Diagnosis not present

## 2014-12-13 DIAGNOSIS — E785 Hyperlipidemia, unspecified: Secondary | ICD-10-CM | POA: Diagnosis not present

## 2014-12-13 DIAGNOSIS — K639 Disease of intestine, unspecified: Secondary | ICD-10-CM

## 2014-12-13 DIAGNOSIS — K5730 Diverticulosis of large intestine without perforation or abscess without bleeding: Secondary | ICD-10-CM | POA: Diagnosis not present

## 2014-12-13 HISTORY — PX: COLONOSCOPY: SHX5424

## 2014-12-13 SURGERY — COLONOSCOPY
Anesthesia: Moderate Sedation

## 2014-12-13 MED ORDER — MEPERIDINE HCL 100 MG/ML IJ SOLN
INTRAMUSCULAR | Status: AC
  Filled 2014-12-13: qty 2

## 2014-12-13 MED ORDER — MIDAZOLAM HCL 5 MG/5ML IJ SOLN
INTRAMUSCULAR | Status: DC | PRN
  Administered 2014-12-13 (×3): 1 mg via INTRAVENOUS
  Administered 2014-12-13: 2 mg via INTRAVENOUS

## 2014-12-13 MED ORDER — ONDANSETRON HCL 4 MG/2ML IJ SOLN
INTRAMUSCULAR | Status: AC
  Filled 2014-12-13: qty 2

## 2014-12-13 MED ORDER — ONDANSETRON HCL 4 MG/2ML IJ SOLN
INTRAMUSCULAR | Status: DC | PRN
  Administered 2014-12-13: 4 mg via INTRAVENOUS

## 2014-12-13 MED ORDER — MEPERIDINE HCL 100 MG/ML IJ SOLN
INTRAMUSCULAR | Status: DC | PRN
  Administered 2014-12-13 (×3): 25 mg via INTRAVENOUS

## 2014-12-13 MED ORDER — STERILE WATER FOR IRRIGATION IR SOLN
Status: DC | PRN
  Administered 2014-12-13: 11:00:00

## 2014-12-13 MED ORDER — MIDAZOLAM HCL 5 MG/5ML IJ SOLN
INTRAMUSCULAR | Status: AC
  Filled 2014-12-13: qty 10

## 2014-12-13 MED ORDER — SODIUM CHLORIDE 0.9 % IV SOLN
INTRAVENOUS | Status: DC
  Administered 2014-12-13: 11:00:00 via INTRAVENOUS

## 2014-12-13 NOTE — Op Note (Signed)
[facility] [address] [place], [address]   COLONOSCOPY PROCEDURE REPORT  PATIENT: Gerald, Hall  MR#: [id number] BIRTHDATE: 1934/12/08 , 79  yrs. old GENDER: male ENDOSCOPIST: R.  Garfield Cornea, MD FACP [facility] REFERRED RA:QTMAU Wolfgang Phoenix, M.D. PROCEDURE DATE:  January 03, 2015 PROCEDURE:   Ileo-colonoscopy with biopsy INDICATIONS:positive family history colon cancer; high risk screening. MEDICATIONS: Versed 5 mg IV and Demerol 75 mg IV in divided doses. Zofran 4 mg IV. ASA CLASS:       Class II  CONSENT: The risks, benefits, alternatives and imponderables including but not limited to bleeding, perforation as well as the possibility of a missed lesion have been reviewed.  The potential for biopsy, lesion removal, etc. have also been discussed. Questions have been answered.  All parties agreeable.  Please see the history and physical in the medical record for more information.  DESCRIPTION OF PROCEDURE:   After the risks benefits and alternatives of the procedure were thoroughly explained, informed consent was obtained.  The digital rectal exam revealed hemorrhoids, Grade IV.   The EC-[id number] ([id number])  endoscope was introduced through the anus and advanced to the terminal ileum which was intubated for a short distance. No adverse events experienced.   The quality of the prep was adequate  The instrument was then slowly withdrawn as the colon was fully examined.      COLON FINDINGS: Normal rectum.  Scattered narrow mouth left-sided diverticula; the ascending colon mucosa appeared somewhat "fish egg" in appearance upon close inspection of the mucosa.  No out not inflammatory changes.  No evidence of polyp or neoplasm anywhere in the colon.  The distal 5 cm of terminal ileum mucosa appeared normal.  Retroflexion was performed. .  Withdrawal time=11 minutes 0 seconds.  The scope was withdrawn and the procedure completed. COMPLICATIONS: There were no immediate  complications.  ENDOSCOPIC IMPRESSION: External hemorrhoids. Colonic diverticulosis. Subtly abnormal ascending colon of uncertain significance?"status post biopsy. Patient has no bowel symptoms.  RECOMMENDATIONS: Further recommendations to follow pending review of pathology report.  eSigned:  R. Garfield Cornea, MD Rosalita Chessman [facility] 03-Jan-2015 12:05 PM   cc:  CPT CODES: ICD CODES:  The ICD and CPT codes recommended by this software are interpretations from the data that the clinical staff has captured with the software.  The verification of the translation of this report to the ICD and CPT codes and modifiers is the sole responsibility of the health care institution and practicing physician where this report was generated.  [employer]. will not be held responsible for the validity of the ICD and CPT codes included on this report.  AMA assumes no liability for data contained or not contained herein. CPT is a Designer, television/film set of the [employer].

## 2014-12-13 NOTE — H&P (Signed)
@LOGO @   Primary Care Physician:  Sallee Lange, MD Primary Gastroenterologist:  Dr. Gala Romney  Pre-Procedure History & Physical: HPI:  Gerald Hall is a 80 y.o. male is here for a screening colonoscopy. Negative colonoscopy 2009. No bowel symptoms. Brother with colon cancer.  Past Medical History  Diagnosis Date  . Hyperlipidemia   . Blood in urine     Sees urology yearly  . Deafness in right ear age 60    Past Surgical History  Procedure Laterality Date  . Back surgery    . Knee surgery Left     around 2000  . Cataract extraction Bilateral 07/30/14, 08/13/2014  . Colonoscopy  12/19/2007    RMR: 1. External hemorrohids, otherwise normal rectum.2. Normal colon. 3. Normal terminal ileum.    Prior to Admission medications   Medication Sig Start Date End Date Taking? Authorizing Provider  aspirin EC 81 MG tablet Take 81 mg by mouth daily.   Yes Historical Provider, MD  finasteride (PROSCAR) 5 MG tablet TAKE 1 TABLET DAILY 10/10/14  Yes Kathyrn Drown, MD  fluticasone (FLONASE) 50 MCG/ACT nasal spray INHALE 2 PUFFS IN EACH NOSTRIL DAILY 10/10/14  Yes Kathyrn Drown, MD  LORazepam (ATIVAN) 1 MG tablet 1/2 to 1 qhs prn insomnia Patient taking differently: Take 0.5-1 mg by mouth at bedtime as needed (insomnia).  09/03/14  Yes Kathyrn Drown, MD  peg 3350 powder (MOVIPREP) 100 G SOLR Take 1 kit (200 g total) by mouth as directed. 11/21/14  Yes Daneil Dolin, MD  potassium chloride (K-DUR) 10 MEQ tablet TAKE 1 TABLET DAILY 10/10/14  Yes Kathyrn Drown, MD  predniSONE (DELTASONE) 5 MG tablet Take 2.5 mg by mouth daily. 11/18/14  Yes Historical Provider, MD  traMADol (ULTRAM) 50 MG tablet Take 1 tablet (50 mg total) by mouth 3 (three) times daily as needed. Patient taking differently: Take 50 mg by mouth 2 (two) times daily.  06/04/14  Yes Kathyrn Drown, MD  triamterene-hydrochlorothiazide (EXNTZGY-17) 37.5-25 MG per tablet TAKE 1 TABLET DAILY 08/12/14  Yes Kathyrn Drown, MD    Allergies as  of 11/20/2014  . (No Known Allergies)    Family History  Problem Relation Age of Onset  . Cancer Brother     colon    History   Social History  . Marital Status: Married    Spouse Name: N/A  . Number of Children: N/A  . Years of Education: N/A   Occupational History  . Not on file.   Social History Main Topics  . Smoking status: Current Every Day Smoker  . Smokeless tobacco: Not on file     Comment: 1/2 pack cigarettes daily  . Alcohol Use: Not on file  . Drug Use: Not on file  . Sexual Activity: Not on file   Other Topics Concern  . Not on file   Social History Narrative    Review of Systems: See HPI, otherwise negative ROS  Physical Exam: BP 135/78 mmHg  Pulse 85  Temp(Src) 98 F (36.7 C) (Oral)  Resp 18  SpO2 97% General:   Alert,  Well-developed, well-nourished, pleasant and cooperative in NAD Head:  Normocephalic and atraumatic. Eyes:  Sclera clear, no icterus.   Conjunctiva pink. Ears:  Normal auditory acuity. Nose:  No deformity, discharge,  or lesions. Mouth:  No deformity or lesions, dentition normal. Neck:  Supple; no masses or thyromegaly. Lungs:  Clear throughout to auscultation.   No wheezes, crackles, or rhonchi. No acute distress. Heart:  Regular rate and rhythm; no murmurs, clicks, rubs,  or gallops. Abdomen:  Soft, nontender and nondistended. No masses, hepatosplenomegaly or hernias noted. Normal bowel sounds, without guarding, and without rebound.   Msk:  Symmetrical without gross deformities. Normal posture. Pulses:  Normal pulses noted. Extremities:  Without clubbing or edema. Neurologic:  Alert and  oriented x4;  grossly normal neurologically. Skin:  Intact without significant lesions or rashes. Cervical Nodes:  No significant cervical adenopathy. Psych:  Alert and cooperative. Normal mood and affect.  Impression/Plan: Gerald Hall is now here to undergo a screening colonoscopy.  High risk screening examination.   Risks, benefits,  limitations, imponderables and alternatives regarding colonoscopy have been reviewed with the patient. Questions have been answered. All parties agreeable.     Notice:  This dictation was prepared with Dragon dictation along with smaller phrase technology. Any transcriptional errors that result from this process are unintentional and may not be corrected upon review.

## 2014-12-13 NOTE — Discharge Instructions (Signed)
Colonoscopy Discharge Instructions  Read the instructions outlined below and refer to this sheet in the next few weeks. These discharge instructions provide you with general information on caring for yourself after you leave the hospital. Your doctor may also give you specific instructions. While your treatment has been planned according to the most current medical practices available, unavoidable complications occasionally occur. If you have any problems or questions after discharge, call Dr. Gala Romney at [phone]. ACTIVITY  You may resume your regular activity, but move at a slower pace for the next 24 hours.   Take frequent rest periods for the next 24 hours.   Walking will help get rid of the air and reduce the bloated feeling in your belly (abdomen).   No driving for 24 hours (because of the medicine (anesthesia) used during the test).    Do not sign any important legal documents or operate any machinery for 24 hours (because of the anesthesia used during the test).  NUTRITION  Drink plenty of fluids.   You may resume your normal diet as instructed by your doctor.   Begin with a light meal and progress to your normal diet. Heavy or fried foods are harder to digest and may make you feel sick to your stomach (nauseated).   Avoid alcoholic beverages for 24 hours or as instructed.  MEDICATIONS  You may resume your normal medications unless your doctor tells you otherwise.  WHAT YOU CAN EXPECT TODAY  Some feelings of bloating in the abdomen.   Passage of more gas than usual.   Spotting of blood in your stool or on the toilet paper.  IF YOU HAD POLYPS REMOVED DURING THE COLONOSCOPY:  No aspirin products for 7 days or as instructed.   No alcohol for 7 days or as instructed.   Eat a soft diet for the next 24 hours.  FINDING OUT THE RESULTS OF YOUR TEST Not all test results are available during your visit. If your test results are not back during the visit, make an appointment  with your caregiver to find out the results. Do not assume everything is normal if you have not heard from your caregiver or the medical facility. It is important for you to follow up on all of your test results.  SEEK IMMEDIATE MEDICAL ATTENTION IF:  You have more than a spotting of blood in your stool.   Your belly is swollen (abdominal distention).   You are nauseated or vomiting.   You have a temperature over 101.   You have abdominal pain or discomfort that is severe or gets worse throughout the day.    Diverticulosis information provided  Further recommendations to follow pending review of pathology report  Diverticulosis Diverticulosis is the condition that develops when small pouches (diverticula) form in the wall of your colon. Your colon, or large intestine, is where water is absorbed and stool is formed. The pouches form when the inside layer of your colon pushes through weak spots in the outer layers of your colon. CAUSES  No one knows exactly what causes diverticulosis. RISK FACTORS  Being older than 40. Your risk for this condition increases with age. Diverticulosis is rare in people younger than 40 years. By age 68, almost everyone has it.  Eating a low-fiber diet.  Being frequently constipated.  Being overweight.  Not getting enough exercise.  Smoking.  Taking over-the-counter pain medicines, like aspirin and ibuprofen. SYMPTOMS  Most people with diverticulosis do not have symptoms. DIAGNOSIS  Because diverticulosis often  has no symptoms, health care providers often discover the condition during an exam for other colon problems. In many cases, a health care provider will diagnose diverticulosis while using a flexible scope to examine the colon (colonoscopy). TREATMENT  If you have never developed an infection related to diverticulosis, you may not need treatment. If you have had an infection before, treatment may include:  Eating more fruits, vegetables,  and grains.  Taking a fiber supplement.  Taking a live bacteria supplement (probiotic).  Taking medicine to relax your colon. HOME CARE INSTRUCTIONS   Drink at least 6-8 glasses of water each day to prevent constipation.  Try not to strain when you have a bowel movement.  Keep all follow-up appointments. If you have had an infection before:  Increase the fiber in your diet as directed by your health care provider or dietitian.  Take a dietary fiber supplement if your health care provider approves.  Only take medicines as directed by your health care provider. SEEK MEDICAL CARE IF:   You have abdominal pain.  You have bloating.  You have cramps.  You have not gone to the bathroom in 3 days. SEEK IMMEDIATE MEDICAL CARE IF:   Your pain gets worse.  Yourbloating becomes very bad.  You have a fever or chills, and your symptoms suddenly get worse.  You begin vomiting.  You have bowel movements that are bloody or black. MAKE SURE YOU:  Understand these instructions.  Will watch your condition.  Will get help right away if you are not doing well or get worse. Document Released: 07/08/2004 Document Revised: 10/16/2013 Document Reviewed: 09/05/2013 [facility] Patient Information 2015 [place], [place]. This information is not intended to replace advice given to you by your health care provider. Make sure you discuss any questions you have with your health care provider.

## 2014-12-16 ENCOUNTER — Encounter (HOSPITAL_COMMUNITY): Payer: Self-pay | Admitting: Internal Medicine

## 2014-12-19 ENCOUNTER — Encounter: Payer: Self-pay | Admitting: Internal Medicine

## 2014-12-19 DIAGNOSIS — M353 Polymyalgia rheumatica: Secondary | ICD-10-CM | POA: Diagnosis not present

## 2014-12-30 ENCOUNTER — Other Ambulatory Visit: Payer: Self-pay | Admitting: Family Medicine

## 2015-01-01 ENCOUNTER — Telehealth: Payer: Self-pay | Admitting: *Deleted

## 2015-01-01 DIAGNOSIS — D72829 Elevated white blood cell count, unspecified: Secondary | ICD-10-CM

## 2015-01-01 DIAGNOSIS — T502X5S Adverse effect of carbonic-anhydrase inhibitors, benzothiadiazides and other diuretics, sequela: Secondary | ICD-10-CM

## 2015-01-01 NOTE — Telephone Encounter (Signed)
Pt wants to get BW done

## 2015-01-02 NOTE — Addendum Note (Signed)
Addended byCharolotte Capuchin D on: 01/02/2015 04:37 PM   Modules accepted: Orders

## 2015-01-02 NOTE — Telephone Encounter (Signed)
BW order placed. Pt notified.

## 2015-01-02 NOTE — Telephone Encounter (Signed)
Met 7 CBC, pt with diuretic use and leukocytosis( note lipid not needed)

## 2015-01-06 DIAGNOSIS — T502X5S Adverse effect of carbonic-anhydrase inhibitors, benzothiadiazides and other diuretics, sequela: Secondary | ICD-10-CM | POA: Diagnosis not present

## 2015-01-06 DIAGNOSIS — D72829 Elevated white blood cell count, unspecified: Secondary | ICD-10-CM | POA: Diagnosis not present

## 2015-01-07 LAB — CBC WITH DIFFERENTIAL/PLATELET
BASOS ABS: 0 10*3/uL (ref 0.0–0.2)
Basos: 0 %
EOS ABS: 0.3 10*3/uL (ref 0.0–0.4)
Eos: 2 %
HCT: 47.2 % (ref 37.5–51.0)
Hemoglobin: 16.2 g/dL (ref 12.6–17.7)
IMMATURE GRANULOCYTES: 0 %
Immature Grans (Abs): 0 10*3/uL (ref 0.0–0.1)
LYMPHS ABS: 1.8 10*3/uL (ref 0.7–3.1)
Lymphs: 15 %
MCH: 31 pg (ref 26.6–33.0)
MCHC: 34.3 g/dL (ref 31.5–35.7)
MCV: 90 fL (ref 79–97)
MONOS ABS: 1.5 10*3/uL — AB (ref 0.1–0.9)
Monocytes: 13 %
NEUTROS ABS: 8.2 10*3/uL — AB (ref 1.4–7.0)
Neutrophils Relative %: 70 %
PLATELETS: 226 10*3/uL (ref 150–379)
RBC: 5.22 x10E6/uL (ref 4.14–5.80)
RDW: 13.8 % (ref 12.3–15.4)
WBC: 11.7 10*3/uL — AB (ref 3.4–10.8)

## 2015-01-07 LAB — BASIC METABOLIC PANEL
BUN / CREAT RATIO: 17 (ref 10–22)
BUN: 19 mg/dL (ref 8–27)
CHLORIDE: 97 mmol/L (ref 97–108)
CO2: 23 mmol/L (ref 18–29)
Calcium: 9.8 mg/dL (ref 8.6–10.2)
Creatinine, Ser: 1.09 mg/dL (ref 0.76–1.27)
GFR calc Af Amer: 75 mL/min/{1.73_m2} (ref >59)
GFR calc non Af Amer: 65 mL/min/{1.73_m2} (ref >59)
Glucose: 95 mg/dL (ref 65–99)
Potassium: 3.8 mmol/L (ref 3.5–5.2)
Sodium: 138 mmol/L (ref 134–144)

## 2015-01-08 ENCOUNTER — Other Ambulatory Visit: Payer: Self-pay | Admitting: *Deleted

## 2015-01-08 MED ORDER — TRAMADOL HCL 50 MG PO TABS
50.0000 mg | ORAL_TABLET | Freq: Two times a day (BID) | ORAL | Status: DC
Start: 2015-01-08 — End: 2015-07-01

## 2015-01-08 NOTE — Telephone Encounter (Signed)
May have this +5 refills 

## 2015-01-17 ENCOUNTER — Ambulatory Visit: Payer: Self-pay | Admitting: Family Medicine

## 2015-01-20 ENCOUNTER — Encounter: Payer: Self-pay | Admitting: Family Medicine

## 2015-01-20 ENCOUNTER — Ambulatory Visit (INDEPENDENT_AMBULATORY_CARE_PROVIDER_SITE_OTHER): Payer: Medicare Other | Admitting: Family Medicine

## 2015-01-20 VITALS — BP 120/86 | Ht 69.5 in | Wt 172.6 lb

## 2015-01-20 DIAGNOSIS — M4806 Spinal stenosis, lumbar region: Secondary | ICD-10-CM | POA: Diagnosis not present

## 2015-01-20 DIAGNOSIS — Z79899 Other long term (current) drug therapy: Secondary | ICD-10-CM

## 2015-01-20 DIAGNOSIS — M48061 Spinal stenosis, lumbar region without neurogenic claudication: Secondary | ICD-10-CM | POA: Insufficient documentation

## 2015-01-20 DIAGNOSIS — N400 Enlarged prostate without lower urinary tract symptoms: Secondary | ICD-10-CM | POA: Diagnosis not present

## 2015-01-20 DIAGNOSIS — I10 Essential (primary) hypertension: Secondary | ICD-10-CM | POA: Diagnosis not present

## 2015-01-20 DIAGNOSIS — M25551 Pain in right hip: Secondary | ICD-10-CM

## 2015-01-20 DIAGNOSIS — M8580 Other specified disorders of bone density and structure, unspecified site: Secondary | ICD-10-CM | POA: Diagnosis not present

## 2015-01-20 MED ORDER — TAMSULOSIN HCL 0.4 MG PO CAPS: 0.4000 mg | ORAL_CAPSULE | Freq: Every day | ORAL | Status: DC

## 2015-01-20 MED ORDER — TRIAMTERENE-HCTZ 37.5-25 MG PO TABS: 1.0000 | ORAL_TABLET | Freq: Every day | ORAL | Status: DC

## 2015-01-20 MED ORDER — PREDNISONE 5 MG PO TABS: 2.5000 mg | ORAL_TABLET | Freq: Every day | ORAL | Status: DC

## 2015-01-20 MED ORDER — LORAZEPAM 1 MG PO TABS: 0.5000 mg | ORAL_TABLET | Freq: Every evening | ORAL | Status: DC | PRN

## 2015-01-20 NOTE — Progress Notes (Signed)
   Subjective:    Patient ID: Gerald Hall, male    DOB: 25-Jun-1935, 80 y.o.   MRN: [id number]  HPI Patient arrives for a follow up on blood pressure. Patient also having  Pain in right hip and legs and having continued urinary urgency even with proscar. Patient also reports he feels like he is getting weaker in general.  Greater than 25 minutes spent with this patient discussing his weakness relating how it's difficult for him to hold urination at times has difficult times with walking. Patient denies chest pressure shortness of breath hemoptysis. Relates a lot of right hip pain it's been going on for months. He has had problems with spinal stenosis and bilateral leg pain sometimes worse at night. Review of Systems  Constitutional: Negative for activity change, appetite change and fatigue.  HENT: Negative for congestion.   Respiratory: Negative for cough.   Cardiovascular: Negative for chest pain.  Gastrointestinal: Negative for abdominal pain.  Endocrine: Negative for polydipsia and polyphagia.  Neurological: Negative for weakness.  Psychiatric/Behavioral: Negative for confusion.       Objective:   Physical Exam  Constitutional: He appears well-nourished. No distress.  Cardiovascular: Normal rate, regular rhythm and normal heart sounds.   No murmur heard. Pulmonary/Chest: Effort normal and breath sounds normal. No respiratory distress.  Musculoskeletal: He exhibits no edema.  Lymphadenopathy:    He has no cervical adenopathy.  Neurological: He is alert.  Psychiatric: His behavior is normal.  Vitals reviewed.  Patient had prostate exam was enlarged but not Harden       Assessment & Plan:  1. HTN (hypertension), benign Blood pressure under decent control continue current measures watch diet encourage patient to quit smoking is unlikely he will  2. Hip pain, right Ongoing right hip pain discomfort worse with longer walking recommend x-ray await the results - DG HIP UNILAT  WITH PELVIS 2-3 VIEWS RIGHT  3. Osteopenia Patient has been on prednisone for over a year he needs bone density testing - DG Bone Density  4. High risk medication use Because of prednisone will go forward with bone density testing - DG Bone Density  Patient has slight leukocytosis I believe this is related to his prednisone and not related to bone marrow disease hemoglobin looks good  Patient has difficult time with strength some shoulder pain and discomfort as well as weakness in the hips I believe this patient has elements of polymyalgia rheumatica. I would recommend this patient go back to see his rheumatologist for further evaluation may need a higher dose of prednisone  BPH difficult time holding his urine try Flomax look see how that does patient follow-up within 6 weeks if difficulty with medication or blood pressure he is to let us know  Patient does have spinal stenosis sees had previous surgery. This patient has difficult time with walking more than 100 yards because of hip discomfort I doubt its because of his spinal stenosis

## 2015-01-27 ENCOUNTER — Ambulatory Visit (HOSPITAL_COMMUNITY)
Admission: RE | Admit: 2015-01-27 | Discharge: 2015-01-27 | Disposition: A | Discharge status: Home / Self Care | Payer: Medicare Other | Source: Ambulatory Visit | Attending: Family Medicine | Admitting: Family Medicine

## 2015-01-27 DIAGNOSIS — M1611 Unilateral primary osteoarthritis, right hip: Secondary | ICD-10-CM | POA: Diagnosis not present

## 2015-01-27 DIAGNOSIS — M353 Polymyalgia rheumatica: Secondary | ICD-10-CM | POA: Diagnosis not present

## 2015-01-27 DIAGNOSIS — Z79899 Other long term (current) drug therapy: Secondary | ICD-10-CM | POA: Insufficient documentation

## 2015-01-27 DIAGNOSIS — M8580 Other specified disorders of bone density and structure, unspecified site: Secondary | ICD-10-CM | POA: Insufficient documentation

## 2015-01-27 DIAGNOSIS — M810 Age-related osteoporosis without current pathological fracture: Secondary | ICD-10-CM | POA: Diagnosis not present

## 2015-01-28 ENCOUNTER — Encounter: Payer: Self-pay | Admitting: Family Medicine

## 2015-01-28 DIAGNOSIS — M8580 Other specified disorders of bone density and structure, unspecified site: Secondary | ICD-10-CM | POA: Insufficient documentation

## 2015-01-29 DIAGNOSIS — M791 Myalgia: Secondary | ICD-10-CM | POA: Diagnosis not present

## 2015-01-29 DIAGNOSIS — M316 Other giant cell arteritis: Secondary | ICD-10-CM | POA: Diagnosis not present

## 2015-01-29 DIAGNOSIS — M545 Low back pain: Secondary | ICD-10-CM | POA: Diagnosis not present

## 2015-01-31 DIAGNOSIS — B351 Tinea unguium: Secondary | ICD-10-CM | POA: Diagnosis not present

## 2015-01-31 DIAGNOSIS — Q828 Other specified congenital malformations of skin: Secondary | ICD-10-CM | POA: Diagnosis not present

## 2015-02-07 ENCOUNTER — Ambulatory Visit
Admit: 2015-02-07 | Disposition: A | Discharge status: Home / Self Care | Payer: Self-pay | Attending: Rheumatology | Admitting: Rheumatology

## 2015-02-07 DIAGNOSIS — Z9889 Other specified postprocedural states: Secondary | ICD-10-CM | POA: Diagnosis not present

## 2015-02-07 DIAGNOSIS — M5126 Other intervertebral disc displacement, lumbar region: Secondary | ICD-10-CM | POA: Diagnosis not present

## 2015-02-07 DIAGNOSIS — M9973 Connective tissue and disc stenosis of intervertebral foramina of lumbar region: Secondary | ICD-10-CM | POA: Diagnosis not present

## 2015-02-07 DIAGNOSIS — M5186 Other intervertebral disc disorders, lumbar region: Secondary | ICD-10-CM | POA: Diagnosis not present

## 2015-02-07 DIAGNOSIS — M47816 Spondylosis without myelopathy or radiculopathy, lumbar region: Secondary | ICD-10-CM | POA: Diagnosis not present

## 2015-02-07 DIAGNOSIS — M47896 Other spondylosis, lumbar region: Secondary | ICD-10-CM | POA: Diagnosis not present

## 2015-02-07 DIAGNOSIS — M5386 Other specified dorsopathies, lumbar region: Secondary | ICD-10-CM | POA: Diagnosis not present

## 2015-02-12 DIAGNOSIS — M545 Low back pain: Secondary | ICD-10-CM | POA: Diagnosis not present

## 2015-02-12 DIAGNOSIS — M316 Other giant cell arteritis: Secondary | ICD-10-CM | POA: Diagnosis not present

## 2015-02-15 NOTE — Op Note (Signed)
PATIENT NAME:  Gerald Hall, Gerald Hall MR#:  [id number] DATE OF BIRTH:  [birth year]  DATE OF PROCEDURE:  08/13/2014  PREOPERATIVE DIAGNOSIS: Visually significant cataract of the left eye.   POSTOPERATIVE DIAGNOSIS: Visually significant cataract of the left eye.   OPERATIVE PROCEDURE: Cataract extraction by phacoemulsification with implant of intraocular lens to left eye.   SURGEON: Birder Robson, MD.   ANESTHESIA:  1. Managed anesthesia care.  2. Topical tetracaine drops followed by 2% Xylocaine jelly applied in the preoperative holding area.   COMPLICATIONS: None.   TECHNIQUE:  Stop and chop.   DESCRIPTION OF PROCEDURE: The patient was examined and consented in the preoperative holding area where the aforementioned topical anesthesia was applied to the left eye and then brought back to the Operating Room where the left eye was prepped and draped in the usual sterile ophthalmic fashion and a lid speculum was placed. A paracentesis was created with the side port blade and the anterior chamber was filled with viscoelastic. A near clear corneal incision was performed with the steel keratome. A continuous curvilinear capsulorrhexis was performed with a cystotome followed by the capsulorrhexis forceps. Hydrodissection and hydrodelineation were carried out with BSS on a blunt cannula. The lens was removed in a stop and chop technique and the remaining cortical material was removed with the irrigation-aspiration handpiece. The capsular bag was inflated with viscoelastic and the Tecnis ZCB00 22.5-diopter lens, serial number [id number] was placed in the capsular bag without complication. The remaining viscoelastic was removed from the eye with the irrigation-aspiration handpiece. The wounds were hydrated. The anterior chamber was flushed with Miostat and the eye was inflated to physiologic pressure. 0.1 mL of cefuroxime concentration 10 mg/mL was placed in the anterior chamber. The wounds were found to be water  tight. The eye was dressed with Vigamox. The patient was given protective glasses to wear throughout the day and a shield with which to sleep tonight. The patient was also given drops with which to begin a drop regimen today and will follow-up with me in one day.    ____________________________ Livingston Diones. Antoney Biven, MD wlp:JT D: 08/13/2014 21:26:58 ET T: 08/14/2014 00:27:15 ET JOB#: [id number]  cc: Westlee Devita L. Korey Arroyo, MD, <Dictator> Livingston Diones Cylas Falzone MD ELECTRONICALLY SIGNED 08/15/2014 8:55

## 2015-02-15 NOTE — Op Note (Signed)
PATIENT NAME:  Gerald Hall, Gerald Hall MR#:  [id number] DATE OF BIRTH:  06-29-1935  DATE OF PROCEDURE:  07/30/2014  PREOPERATIVE DIAGNOSIS: Visually significant cataract of the right eye.   POSTOPERATIVE DIAGNOSIS: Visually significant cataract of the right eye.   OPERATIVE PROCEDURE: Cataract extraction by phacoemulsification with implant of intraocular lens to right eye.   SURGEON: Birder Robson, MD.   ANESTHESIA:  1. Managed anesthesia care.  2. Topical tetracaine drops followed by 2% Xylocaine jelly applied in the preoperative holding area.   COMPLICATIONS: None.   TECHNIQUE:  Stop and chop.   DESCRIPTION OF PROCEDURE: The patient was examined and consented in the preoperative holding area where the aforementioned topical anesthesia was applied to the right eye and then brought back to the Operating Room where the right eye was prepped and draped in the usual sterile ophthalmic fashion and a lid speculum was placed. A paracentesis was created with the side port blade and the anterior chamber was filled with viscoelastic. A near clear corneal incision was performed with the steel keratome. A continuous curvilinear capsulorrhexis was performed with a cystotome followed by the capsulorrhexis forceps. Hydrodissection and hydrodelineation were carried out with BSS on a blunt cannula. The lens was removed in a stop and chop technique and the remaining cortical material was removed with the irrigation-aspiration handpiece. The capsular bag was inflated with viscoelastic and the Tecnis ZCB00 22.5-diopter lens, serial number [id number] was placed in the capsular bag without complication. The remaining viscoelastic was removed from the eye with the irrigation-aspiration handpiece. The wounds were hydrated. The anterior chamber was flushed with Miostat and the eye was inflated to physiologic pressure. 0.1 mL of cefuroxime concentration 10 mg/mL was placed in the anterior chamber. The wounds were found to be  water tight. The eye was dressed with Vigamox. The patient was given protective glasses to wear throughout the day and a shield with which to sleep tonight. The patient was also given drops with which to begin a drop regimen today and will follow-up with me in one day.    ____________________________ Livingston Diones. Tagen Milby, MD wlp:lt D: 07/30/2014 19:25:06 ET T: 07/30/2014 20:15:52 ET JOB#: [id number]  cc: Jacobi Ryant L. Emika Tiano, MD, <Dictator> Livingston Diones Leonard Hendler MD ELECTRONICALLY SIGNED 07/31/2014 12:13

## 2015-02-27 DIAGNOSIS — M5416 Radiculopathy, lumbar region: Secondary | ICD-10-CM | POA: Diagnosis not present

## 2015-02-27 DIAGNOSIS — M4806 Spinal stenosis, lumbar region: Secondary | ICD-10-CM | POA: Diagnosis not present

## 2015-02-27 DIAGNOSIS — M5126 Other intervertebral disc displacement, lumbar region: Secondary | ICD-10-CM | POA: Diagnosis not present

## 2015-02-28 DIAGNOSIS — M5126 Other intervertebral disc displacement, lumbar region: Secondary | ICD-10-CM | POA: Diagnosis not present

## 2015-02-28 DIAGNOSIS — M4806 Spinal stenosis, lumbar region: Secondary | ICD-10-CM | POA: Diagnosis not present

## 2015-02-28 DIAGNOSIS — M5416 Radiculopathy, lumbar region: Secondary | ICD-10-CM | POA: Diagnosis not present

## 2015-03-14 DIAGNOSIS — Z961 Presence of intraocular lens: Secondary | ICD-10-CM | POA: Diagnosis not present

## 2015-03-14 DIAGNOSIS — H3531 Nonexudative age-related macular degeneration: Secondary | ICD-10-CM | POA: Diagnosis not present

## 2015-03-18 DIAGNOSIS — M545 Low back pain: Secondary | ICD-10-CM | POA: Diagnosis not present

## 2015-03-18 DIAGNOSIS — M316 Other giant cell arteritis: Secondary | ICD-10-CM | POA: Diagnosis not present

## 2015-03-21 DIAGNOSIS — M519 Unspecified thoracic, thoracolumbar and lumbosacral intervertebral disc disorder: Secondary | ICD-10-CM | POA: Diagnosis not present

## 2015-03-21 DIAGNOSIS — M5416 Radiculopathy, lumbar region: Secondary | ICD-10-CM | POA: Diagnosis not present

## 2015-03-31 ENCOUNTER — Other Ambulatory Visit: Payer: Self-pay | Admitting: Family Medicine

## 2015-04-11 ENCOUNTER — Other Ambulatory Visit: Payer: Self-pay | Admitting: Family Medicine

## 2015-04-17 DIAGNOSIS — M316 Other giant cell arteritis: Secondary | ICD-10-CM | POA: Diagnosis not present

## 2015-04-18 ENCOUNTER — Encounter: Payer: Self-pay | Admitting: Family Medicine

## 2015-04-18 ENCOUNTER — Ambulatory Visit (INDEPENDENT_AMBULATORY_CARE_PROVIDER_SITE_OTHER): Payer: Medicare Other | Admitting: Family Medicine

## 2015-04-18 VITALS — BP 126/74 | Ht 69.5 in | Wt 167.0 lb

## 2015-04-18 DIAGNOSIS — I10 Essential (primary) hypertension: Secondary | ICD-10-CM | POA: Diagnosis not present

## 2015-04-18 DIAGNOSIS — D692 Other nonthrombocytopenic purpura: Secondary | ICD-10-CM

## 2015-04-18 NOTE — Progress Notes (Signed)
   Subjective:    Patient ID: RENNY REMER, male    DOB: Mar 27, 1935, 80 y.o.   MRN: [id number]  Hypertension This is a chronic problem. The current episode started more than 1 year ago.   Having bilateral hip and leg pain. Ongoing.   Had MRI at last visit. Has seen specialist and two injections. Helped some but not enough.    Review of Systems     Objective:   Physical Exam Lungs are clear hearts regular blood pressure recheck is good extremities no edema       Assessment & Plan:  Blood pressure good control continue current measures no need for lab work currently follow-up 6 months  His eye doctor indicated that he may have early macular degeneration he will discuss with his eye doctor what that exactly means  Chronic lumbar pain under the care of specialists currently

## 2015-04-22 ENCOUNTER — Ambulatory Visit: Payer: Medicare Other | Admitting: Family Medicine

## 2015-04-29 ENCOUNTER — Other Ambulatory Visit: Payer: Self-pay | Admitting: Family Medicine

## 2015-05-05 DIAGNOSIS — M5126 Other intervertebral disc displacement, lumbar region: Secondary | ICD-10-CM | POA: Diagnosis not present

## 2015-05-05 DIAGNOSIS — M4806 Spinal stenosis, lumbar region: Secondary | ICD-10-CM | POA: Diagnosis not present

## 2015-05-05 DIAGNOSIS — M5416 Radiculopathy, lumbar region: Secondary | ICD-10-CM | POA: Diagnosis not present

## 2015-05-20 DIAGNOSIS — B351 Tinea unguium: Secondary | ICD-10-CM | POA: Diagnosis not present

## 2015-05-20 DIAGNOSIS — Q828 Other specified congenital malformations of skin: Secondary | ICD-10-CM | POA: Diagnosis not present

## 2015-05-21 DIAGNOSIS — M316 Other giant cell arteritis: Secondary | ICD-10-CM | POA: Diagnosis not present

## 2015-05-29 DIAGNOSIS — M4806 Spinal stenosis, lumbar region: Secondary | ICD-10-CM | POA: Diagnosis not present

## 2015-05-30 ENCOUNTER — Other Ambulatory Visit: Payer: Self-pay | Admitting: Family Medicine

## 2015-06-04 DIAGNOSIS — M316 Other giant cell arteritis: Secondary | ICD-10-CM | POA: Diagnosis not present

## 2015-07-01 ENCOUNTER — Other Ambulatory Visit: Payer: Self-pay | Admitting: *Deleted

## 2015-07-01 NOTE — Progress Notes (Signed)
May refill this +4 additional refills 

## 2015-07-02 MED ORDER — TRAMADOL HCL 50 MG PO TABS: 50.0000 mg | ORAL_TABLET | Freq: Two times a day (BID) | ORAL | Status: DC

## 2015-07-08 ENCOUNTER — Other Ambulatory Visit: Payer: Self-pay | Admitting: Family Medicine

## 2015-07-08 DIAGNOSIS — M316 Other giant cell arteritis: Secondary | ICD-10-CM | POA: Diagnosis not present

## 2015-07-17 ENCOUNTER — Other Ambulatory Visit: Payer: Self-pay | Admitting: Family Medicine

## 2015-07-22 ENCOUNTER — Other Ambulatory Visit: Payer: Self-pay | Admitting: Family Medicine

## 2015-07-22 MED ORDER — TRAMADOL HCL 50 MG PO TABS: 50.0000 mg | ORAL_TABLET | Freq: Four times a day (QID) | ORAL | Status: DC | PRN

## 2015-07-28 ENCOUNTER — Other Ambulatory Visit: Payer: Self-pay | Admitting: *Deleted

## 2015-07-28 NOTE — Progress Notes (Signed)
This and Ok times 4 refills

## 2015-07-29 ENCOUNTER — Other Ambulatory Visit: Payer: Self-pay | Admitting: *Deleted

## 2015-07-29 MED ORDER — LORAZEPAM 1 MG PO TABS: 0.5000 mg | ORAL_TABLET | Freq: Every evening | ORAL | Status: DC | PRN

## 2015-08-05 ENCOUNTER — Telehealth: Payer: Self-pay

## 2015-08-05 DIAGNOSIS — R5383 Other fatigue: Secondary | ICD-10-CM

## 2015-08-05 DIAGNOSIS — M791 Myalgia, unspecified site: Secondary | ICD-10-CM

## 2015-08-05 DIAGNOSIS — D72829 Elevated white blood cell count, unspecified: Secondary | ICD-10-CM

## 2015-08-05 DIAGNOSIS — R531 Weakness: Secondary | ICD-10-CM

## 2015-08-05 DIAGNOSIS — M4806 Spinal stenosis, lumbar region: Secondary | ICD-10-CM | POA: Diagnosis not present

## 2015-08-05 NOTE — Telephone Encounter (Signed)
Patient was told to report to [facility] in the am to have blood work done. Blood work was ordered.

## 2015-08-05 NOTE — Telephone Encounter (Signed)
If possible I would like the patient to do blood work before coming if possible at least one day ahead of time trying to do on Wednesday. Recommend the following-CBC, met 7, CK, sedimentation rate, TSH-reason diuretic use leukocytosis myalgias and weakness/fatigue

## 2015-08-05 NOTE — Telephone Encounter (Signed)
Patient called needing to be seen urgently per his neurologist because of his leg pain and weakness. Notified patient to report to [facility] in the morning to have blood work done per Dr. Nicki Reaper for his appointment later this week. Please order lab work.

## 2015-08-06 DIAGNOSIS — R5383 Other fatigue: Secondary | ICD-10-CM | POA: Diagnosis not present

## 2015-08-06 DIAGNOSIS — D72829 Elevated white blood cell count, unspecified: Secondary | ICD-10-CM | POA: Diagnosis not present

## 2015-08-06 DIAGNOSIS — M791 Myalgia: Secondary | ICD-10-CM | POA: Diagnosis not present

## 2015-08-06 DIAGNOSIS — R531 Weakness: Secondary | ICD-10-CM | POA: Diagnosis not present

## 2015-08-07 ENCOUNTER — Ambulatory Visit (INDEPENDENT_AMBULATORY_CARE_PROVIDER_SITE_OTHER): Payer: Medicare Other | Admitting: Family Medicine

## 2015-08-07 ENCOUNTER — Encounter: Payer: Self-pay | Admitting: Family Medicine

## 2015-08-07 ENCOUNTER — Ambulatory Visit: Payer: Medicare Other | Admitting: Family Medicine

## 2015-08-07 VITALS — BP 112/76 | Ht 69.5 in | Wt 160.0 lb

## 2015-08-07 DIAGNOSIS — R269 Unspecified abnormalities of gait and mobility: Secondary | ICD-10-CM | POA: Diagnosis not present

## 2015-08-07 DIAGNOSIS — R29898 Other symptoms and signs involving the musculoskeletal system: Secondary | ICD-10-CM

## 2015-08-07 DIAGNOSIS — R261 Paralytic gait: Secondary | ICD-10-CM

## 2015-08-07 DIAGNOSIS — R531 Weakness: Secondary | ICD-10-CM | POA: Diagnosis not present

## 2015-08-07 LAB — CBC
Hematocrit: 48 % (ref 37.5–51.0)
Hemoglobin: 16.2 g/dL (ref 12.6–17.7)
MCH: 30.9 pg (ref 26.6–33.0)
MCHC: 33.8 g/dL (ref 31.5–35.7)
MCV: 91 fL (ref 79–97)
Platelets: 227 10*3/uL (ref 150–379)
RBC: 5.25 x10E6/uL (ref 4.14–5.80)
RDW: 13.6 % (ref 12.3–15.4)
WBC: 9.2 10*3/uL (ref 3.4–10.8)

## 2015-08-07 LAB — CK: CK TOTAL: 64 U/L (ref 24–204)

## 2015-08-07 LAB — BASIC METABOLIC PANEL
BUN/Creatinine Ratio: 13 (ref 10–22)
BUN: 14 mg/dL (ref 8–27)
CALCIUM: 10 mg/dL (ref 8.6–10.2)
CHLORIDE: 95 mmol/L — AB (ref 97–108)
CO2: 28 mmol/L (ref 18–29)
Creatinine, Ser: 1.08 mg/dL (ref 0.76–1.27)
GFR calc Af Amer: 76 mL/min/{1.73_m2} (ref >59)
GFR calc non Af Amer: 66 mL/min/{1.73_m2} (ref >59)
GLUCOSE: 88 mg/dL (ref 65–99)
POTASSIUM: 3.9 mmol/L (ref 3.5–5.2)
SODIUM: 139 mmol/L (ref 134–144)

## 2015-08-07 LAB — TSH: TSH: 0.752 u[IU]/mL (ref 0.450–4.500)

## 2015-08-07 LAB — SEDIMENTATION RATE: SED RATE: 7 mm/h (ref 0–30)

## 2015-08-07 NOTE — Progress Notes (Signed)
   Subjective:    Patient ID: Gerald Hall, male    DOB: [birth year], 80 y.o.   MRN: [id number]  HPIfollow up on bilateral leg pain and weakness. Pt states he is getting worse the past 2 months.  This patient relates that he was being followed by a neurosurgeon for spinal stenosis but over the past couple months he has had progressive difficulty walking and over the past few weeks with weakness in a what appears to be a very difficult time lifting his legs to walk he is having to use a walker on a regular basis he denies headaches and neck stiffness denies muscle soreness pains or myalgia. In the past patient has had temporal arteritis treated with prednisone and spinal stenosis. Neurosurgeon did not feel surgery would be of any benefit.  Declines flu vaccine.      Review of Systems Patient denies any symptoms into his hands denies nausea vomiting diarrhea denies fever sweats chills relates appetite fair states energy level low feels much weaker than he did.      Objective:   Physical Exam  This patient has a wide based spastic gait significant decline over the past 90 days. This patient is unsteady and has a strong risk of falling into the walls or falling. He has to hold onto somebody or hold onto a walker to walk. His strength in his legs is 4+ but by no means is this gait due to spinal stenosis.   finger to nose normal Romberg negative healing my normal pupils responsive to light heart regular no murmurs lungs clear no crackles skin warm dry extremities no edema    Assessment & Plan:   significant difficulty walking with spastic gait very alarming deterioration in this patient over the past 60 days. He is to continue to use a walker. We will need to go ahead and do a MRI the brain because of this and we will need to set him up for urgent neurology consultation. Patient may well have a spinal cord degenerative issue. Unfortunately this could get worse. I will see the patient back in 4 weeks  hopefully we will have more info.  25 minutes was spent with the patient. Greater than half the time was spent in discussion and answering questions and counseling regarding the issues that the patient came in for today.

## 2015-08-08 ENCOUNTER — Ambulatory Visit (HOSPITAL_COMMUNITY)
Admission: RE | Admit: 2015-08-08 | Discharge: 2015-08-08 | Disposition: A | Discharge status: Home / Self Care | Payer: Medicare Other | Source: Ambulatory Visit | Attending: Family Medicine | Admitting: Family Medicine

## 2015-08-08 ENCOUNTER — Other Ambulatory Visit: Payer: Self-pay

## 2015-08-08 DIAGNOSIS — R531 Weakness: Secondary | ICD-10-CM | POA: Insufficient documentation

## 2015-08-08 DIAGNOSIS — I6782 Cerebral ischemia: Secondary | ICD-10-CM | POA: Insufficient documentation

## 2015-08-08 DIAGNOSIS — R261 Paralytic gait: Secondary | ICD-10-CM

## 2015-08-08 DIAGNOSIS — J392 Other diseases of pharynx: Secondary | ICD-10-CM | POA: Diagnosis not present

## 2015-08-08 MED ORDER — GADOBENATE DIMEGLUMINE 529 MG/ML IV SOLN
15.0000 mL | Freq: Once | INTRAVENOUS | Status: AC | PRN
  Administered 2015-08-08: 15 mL via INTRAVENOUS

## 2015-08-11 ENCOUNTER — Other Ambulatory Visit: Payer: Self-pay | Admitting: Family Medicine

## 2015-08-13 DIAGNOSIS — B078 Other viral warts: Secondary | ICD-10-CM | POA: Diagnosis not present

## 2015-08-13 DIAGNOSIS — Z789 Other specified health status: Secondary | ICD-10-CM | POA: Diagnosis not present

## 2015-08-13 DIAGNOSIS — L570 Actinic keratosis: Secondary | ICD-10-CM | POA: Diagnosis not present

## 2015-08-13 DIAGNOSIS — Z85828 Personal history of other malignant neoplasm of skin: Secondary | ICD-10-CM | POA: Diagnosis not present

## 2015-08-13 DIAGNOSIS — Z08 Encounter for follow-up examination after completed treatment for malignant neoplasm: Secondary | ICD-10-CM | POA: Diagnosis not present

## 2015-08-13 DIAGNOSIS — Z1283 Encounter for screening for malignant neoplasm of skin: Secondary | ICD-10-CM | POA: Diagnosis not present

## 2015-08-18 ENCOUNTER — Ambulatory Visit (INDEPENDENT_AMBULATORY_CARE_PROVIDER_SITE_OTHER): Payer: Medicare Other | Admitting: Diagnostic Neuroimaging

## 2015-08-18 ENCOUNTER — Encounter: Payer: Self-pay | Admitting: Diagnostic Neuroimaging

## 2015-08-18 VITALS — BP 131/73 | HR 68 | Ht 69.5 in | Wt 161.0 lb

## 2015-08-18 DIAGNOSIS — R292 Abnormal reflex: Secondary | ICD-10-CM | POA: Diagnosis not present

## 2015-08-18 DIAGNOSIS — R29898 Other symptoms and signs involving the musculoskeletal system: Secondary | ICD-10-CM | POA: Diagnosis not present

## 2015-08-18 DIAGNOSIS — R269 Unspecified abnormalities of gait and mobility: Secondary | ICD-10-CM

## 2015-08-18 DIAGNOSIS — R6889 Other general symptoms and signs: Secondary | ICD-10-CM | POA: Diagnosis not present

## 2015-08-18 DIAGNOSIS — Z79899 Other long term (current) drug therapy: Secondary | ICD-10-CM

## 2015-08-18 NOTE — Progress Notes (Signed)
[facility]  PATIENT: Gerald Hall DOB: 03-30-1935  REFERRING CLINICIAN: Luking HISTORY FROM: patient and daughter  REASON FOR VISIT: new consult   HISTORICAL  CHIEF COMPLAINT:  Chief Complaint  Patient presents with  . Weakness    rm 7, internal referral, dgtr [name]    HISTORY OF PRESENT ILLNESS:   80 year old right-handed male here for evaluation of lower extremity weakness.  In 2006 patient had onset of bilateral knee pain. In 2007 he was having pain in his shins. Around this time he was diagnosed with lumbar spinal stenosis. This is managed conservatively until 2010 when he underwent surgical treatment. He had some improvement in lower extremity pain and gait. Over the next few years he gradually worsened.  2016 he started having increasing weakness in his legs. He was having to use a walker. Over the past 2-3 months he has now developed weakness in his arms.  April 2015 patient was diagnosed with left temporal arteritis on a clinical basis. Use having left-sided pain in and around his left eye. Also had biopsy of the left temporal artery 2 which was negative. He was treated with prednisone with significant helped his symptoms. He was gradually weaned off and has done well. No recurrence of left-sided headaches.  Patient denies any speech or swallowing difficulties. No double vision. No numbness or tingling.   REVIEW OF SYSTEMS: Full 14 system review of systems performed and notable only for joint pain aching muscles incontinence easy bruising easy bleeding memory loss insomnia restless legs.   ALLERGIES: No Known Allergies  HOME MEDICATIONS: Outpatient Prescriptions Prior to Visit  Medication Sig Dispense Refill  . Acetaminophen (TYLENOL) 325 MG CAPS Take by mouth.    Marland Kitchen aspirin EC 81 MG tablet Take 81 mg by mouth daily.    . fluticasone (FLONASE) 50 MCG/ACT nasal spray INHALE TWO SPRAYS IN EACH NOSTRIL EVERY DAY. 16 g 0  . LORazepam (ATIVAN)  1 MG tablet Take 0.5-1 tablets (0.5-1 mg total) by mouth at bedtime as needed (insomnia). 30 tablet 3  . OVER THE COUNTER MEDICATION     . OVER THE COUNTER MEDICATION Stool softner qd prn    . potassium chloride (K-DUR) 10 MEQ tablet TAKE 1 TABLET ONCE DAILY 30 tablet 0  . traMADol (ULTRAM) 50 MG tablet Take 1 tablet (50 mg total) by mouth every 6 (six) hours as needed. 120 tablet 4  . triamterene-hydrochlorothiazide (MAXZIDE-25) 37.5-25 MG per tablet Take 1 tablet by mouth daily. 30 tablet 5  . tamsulosin (FLOMAX) 0.4 MG CAPS capsule TAKE 1 CAPSULE DAILY AT BEDTIME. 30 capsule 4   No facility-administered medications prior to visit.    PAST MEDICAL HISTORY: Past Medical History  Diagnosis Date  . Hyperlipidemia   . Blood in urine     Sees urology yearly  . Deafness in right ear age 69    PAST SURGICAL HISTORY: Past Surgical History  Procedure Laterality Date  . Back surgery  2010  . Knee surgery Left     around 2000, arthroscopic  . Cataract extraction Bilateral 07/30/14, 08/13/2014  . Colonoscopy  12/19/2007    RMR: 1. External hemorrohids, otherwise normal rectum.2. Normal colon. 3. Normal terminal ileum.  . Colonoscopy N/A 12/13/2014    Procedure: COLONOSCOPY;  Surgeon: Daneil Dolin, MD;  Location: [facility];  Service: Endoscopy;  Laterality: N/A;  11:15 Pt Request Time    FAMILY HISTORY: Family History  Problem Relation Age of Onset  . Cancer Brother  colon  . Diabetes Brother   . Diabetes Brother     SOCIAL HISTORY:  Social History   Social History  . Marital Status: Married    Spouse Name: Pamala Hurry  . Number of Children: 2  . Years of Education: 16   Occupational History  .      retired Software engineer   Social History Main Topics  . Smoking status: Current Every Day Smoker -- 0.50 packs/day    Types: Cigarettes  . Smokeless tobacco: Not on file     Comment: 08/18/15 1/2 - 3/4 pack cigarettes daily  . Alcohol Use: 0.0 oz/week    0 Standard drinks  or equivalent per week     Comment: occas  . Drug Use: No  . Sexual Activity: Not on file   Other Topics Concern  . Not on file   Social History Narrative   Lives at home with wife, who has PD   Caffeine use- coffee 8-10 cups daily     PHYSICAL EXAM  GENERAL EXAM/CONSTITUTIONAL: Vitals:  Filed Vitals:   08/18/15 1155  BP: 131/73  Pulse: 68  Height: 5' 9.5" (1.765 m)  Weight: 161 lb (73.029 kg)     Body mass index is 23.44 kg/(m^2).  Visual Acuity Screening   Right eye Left eye Both eyes  Without correction:     With correction: 20/50 20/40      Patient is in no distress; well developed, nourished and groomed; neck is supple  CARDIOVASCULAR:  Examination of carotid arteries is normal; no carotid bruits  Regular rate and rhythm, no murmurs  Examination of peripheral vascular system by observation and palpation is normal  EYES:  Ophthalmoscopic exam of optic discs and posterior segments is normal; no papilledema or hemorrhages  MUSCULOSKELETAL:  Gait, strength, tone, movements noted in Neurologic exam below  NEUROLOGIC: MENTAL STATUS:  No flowsheet data found.  awake, alert, oriented to person, place and time  recent and remote memory intact  normal attention and concentration  language fluent, comprehension intact, naming intact,   fund of knowledge appropriate  CRANIAL NERVE:   2nd - no papilledema on fundoscopic exam  2nd, 3rd, 4th, 6th - pupils equal and reactive to light, visual fields full to confrontation, extraocular muscles intact, no nystagmus  5th - facial sensation symmetric  7th - facial strength symmetric  8th - hearing intact  9th - palate elevates symmetrically, uvula midline  11th - shoulder shrug symmetric  12th - tongue protrusion midline  MOTOR:   normal bulk and tone, full strength in the BUE; BLE (HF 3, KE/KF 4, DF 5); RIGHT LEG INCREASED TONE  SENSORY:   normal and symmetric to light touch, pinprick,  temperature; DECR VIB AT TOES  COORDINATION:   finger-nose-finger, fine finger movements normal  REFLEXES:   deep tendon reflexes present; BRISK AT KNEES (R > L); RIGHT TOE UP AND DOWN GOING; POSITIVE HOFFMANS ON RIGHT  GAIT/STATION:   SLOW TO STAND; UNSTEADY; RIGHT LEG TURNED OUTWARD; SLIGHTLY HEMIPARETIC GAIT; USES ROLLING WALKER    DIAGNOSTIC DATA (LABS, IMAGING, TESTING) - I reviewed patient records, labs, notes, testing and imaging myself where available.  Lab Results  Component Value Date   WBC 9.2 08/06/2015   HGB 16.2 01/06/2015   HCT 48.0 08/06/2015   MCV 90 01/06/2015   PLT 226 01/06/2015      Component Value Date/Time   NA 139 08/06/2015 1101   NA 140 09/06/2014 1259   K 3.9 08/06/2015 1101  CL 95* 08/06/2015 1101   CO2 28 08/06/2015 1101   GLUCOSE 88 08/06/2015 1101   GLUCOSE 90 09/06/2014 1259   BUN 14 08/06/2015 1101   BUN 17 09/06/2014 1259   CREATININE 1.08 08/06/2015 1101   CREATININE 1.14 09/06/2014 1259   CALCIUM 10.0 08/06/2015 1101   PROT 6.3 09/11/2013 1008   ALBUMIN 3.7 09/11/2013 1008   AST 12 09/11/2013 1008   ALT 11 09/11/2013 1008   ALKPHOS 75 09/11/2013 1008   BILITOT 0.8 09/11/2013 1008   GFRNONAA 66 08/06/2015 1101   GFRAA 76 08/06/2015 1101   Lab Results  Component Value Date   CHOL 171 09/06/2014   HDL 46 09/06/2014   LDLCALC 96 09/06/2014   TRIG 145 09/06/2014   CHOLHDL 3.7 09/06/2014   No results found for: HGBA1C No results found for: VITAMINB12 Lab Results  Component Value Date   TSH 0.752 08/06/2015    08/08/15 MRI brain [I reviewed images myself and agree with interpretation. -VRP]  1. Moderate to advanced chronic microvascular ischemia. No acute infarct or mass. 2. Right posterior nasopharyngeal cyst again noted.  02/07/15 MRI lumbar [I reviewed images myself and agree with interpretation. -VRP]  1. Spondylosis most notable at L4-5 where there is new facet mediated 0.5 cm anterolisthesis. Disc bulge much more  prominent to the left and facet arthropathy result in severe left and mild right foraminal narrowing, increased since the prior examination. 2. Interval resolution of a small foraminal protrusion on the left at L2-3.  10/11/06 MRI lumbar spine [I reviewed images myself and agree with interpretation. -VRP]  1. Small left lateral disc protrusions at L2-3 and L3-4.  2. Left paracentral disc protrusion at L4-5 with caudal migration of a disc fragment. There is moderate spinal stenosis. 3. Biforaminal narrowing at L5-S1 due to vertebral spurring.     ASSESSMENT AND PLAN  80 y.o. year old male here with progressive lower extremity weakness and pain since 2006, diagnosed with spinal stenosis and treated in 2010, now with 6-12 months of progressive muscle weakness in lower extremities and most recently in his arms. Also notable for some more weakness in the right compared to left leg with hyperreflexia and upgoing toe on the right side. Also with positive right Hoffmans sign. Findings suggestive of upper motor neuron process. Other considerations could include polyneuropathy, neuropathy, myopathy, neuromuscular junction causes.   Ddx: cervical/thoracic spinal cord compression, polyradiculopathy, myopathy, myasthenia gravis  Gait difficulty - Plan: MR Cervical Spine Wo Contrast, MR Thoracic Spine Wo Contrast, Aldolase, Hemoglobin A1c, Acetylcholine Receptor, Binding  Hyperreflexia - Plan: MR Cervical Spine Wo Contrast, MR Thoracic Spine Wo Contrast, Aldolase, Hemoglobin A1c, Acetylcholine Receptor, Binding  Right leg weakness - Plan: MR Cervical Spine Wo Contrast, MR Thoracic Spine Wo Contrast, Aldolase, Hemoglobin A1c, Acetylcholine Receptor, Binding  Other general symptoms and signs - Plan: MR Cervical Spine Wo Contrast, MR Thoracic Spine Wo Contrast, Aldolase, Hemoglobin A1c, Acetylcholine Receptor, Binding  Long-term use of high-risk medication - Plan: MR Cervical Spine Wo Contrast, MR Thoracic  Spine Wo Contrast, Aldolase, Hemoglobin A1c, Acetylcholine Receptor, Binding    PLAN: - additional workup - home safety precautions - no driving - home health physical therapy  Orders Placed This Encounter  Procedures  . MR Cervical Spine Wo Contrast  . MR Thoracic Spine Wo Contrast  . Aldolase  . Hemoglobin A1c  . Acetylcholine Receptor, Binding  . Ambulatory referral to [place]   Return in about 3 months (around 11/18/2015).  Penni Bombard, MD 40/07/2724, 3:66 PM Certified in Neurology, Neurophysiology and Neuroimaging  [facility] [address], [place],  [address] [phone]

## 2015-08-18 NOTE — Patient Instructions (Signed)
Thank you for coming to see [place] at [facility]. I hope we have been able to provide you high quality care today.  You may receive a patient satisfaction survey over the next few weeks. We would appreciate your feedback and comments so that we may continue to improve ourselves and the health of our patients.  - additional workup - home safety precautions - no driving - home health physical therapy  ~~~~~~~~~~~~~~~~~~~~~~~~~~~~~~~~~~~~~~~~~~~~~~~~~~~~~~~~~~~~~~~~~  DR. PENUMALLI'S GUIDE TO HAPPY AND HEALTHY LIVING These are some of my general health and wellness recommendations. Some of them may apply to you better than others. Please use common sense as you try these suggestions and feel free to ask me any questions.   ACTIVITY/FITNESS Mental, social, emotional and physical stimulation are very important for brain and body health. Try learning a new activity (arts, music, language, sports, games).  Keep moving your body to the best of your abilities.  therapist or personal trainer to get started. Consider the app Sworkit. Fitness trackers such as smart-watches, smart-phones or Fitbits can help as well.   NUTRITION Eat more plants: colorful vegetables, nuts, seeds and berries.  Eat less sugar, salt, preservatives and processed foods.  Avoid toxins such as cigarettes and alcohol.  Drink water when you are thirsty. Warm water with a slice of lemon is an excellent morning drink to start the day.  Consider these websites for more information The Nutrition Source (https://www.henry-hernandez.biz/) Precision Nutrition (WindowBlog.ch)   RELAXATION Consider practicing mindfulness meditation or other relaxation techniques such as deep breathing, prayer, yoga, tai chi, massage. See website mindful.org or the apps Headspace or Calm to help get started.   SLEEP Try to get at least 7-8+ hours sleep per day. Regular exercise and  reduced caffeine will help you sleep better. Practice good sleep hygeine techniques. See website sleep.org for more information.   PLANNING Prepare estate planning, living will, healthcare POA documents. Sometimes this is best planned with the help of an attorney. Theconversationproject.org and agingwithdignity.org are excellent resources.

## 2015-08-19 DIAGNOSIS — Q828 Other specified congenital malformations of skin: Secondary | ICD-10-CM | POA: Diagnosis not present

## 2015-08-19 DIAGNOSIS — B351 Tinea unguium: Secondary | ICD-10-CM | POA: Diagnosis not present

## 2015-08-19 DIAGNOSIS — Z23 Encounter for immunization: Secondary | ICD-10-CM | POA: Diagnosis not present

## 2015-08-20 ENCOUNTER — Telehealth: Payer: Self-pay | Admitting: *Deleted

## 2015-08-20 LAB — HEMOGLOBIN A1C
Est. average glucose Bld gHb Est-mCnc: 114 mg/dL
Hgb A1c MFr Bld: 5.6 % (ref 4.8–5.6)

## 2015-08-20 LAB — ALDOLASE: Aldolase: 5.6 U/L (ref 3.3–10.3)

## 2015-08-20 LAB — ACETYLCHOLINE RECEPTOR, BINDING

## 2015-08-20 NOTE — Telephone Encounter (Signed)
Spoke to patient and informed him that his lab results are normal. He states his MRIs are next Fri., scheduled with [facility]. He states he was told to call each day to check for a cancellation, which he plans to do. He is inquiring if they can be moved up. Informed him would route his request to [facility] who may be able to assist with moving MRIs up. He also inquired if he may drive to nearby businesses. Advised him that Dr Gladstone Lighter instructions were specifically "no driving". This RN advised he follow those instructions until further notice from Dr Leta Baptist. He agreed, verbalized understanding, appreciation.

## 2015-08-21 DIAGNOSIS — R292 Abnormal reflex: Secondary | ICD-10-CM | POA: Diagnosis not present

## 2015-08-21 DIAGNOSIS — R269 Unspecified abnormalities of gait and mobility: Secondary | ICD-10-CM | POA: Diagnosis not present

## 2015-08-21 DIAGNOSIS — R262 Difficulty in walking, not elsewhere classified: Secondary | ICD-10-CM | POA: Diagnosis not present

## 2015-08-21 DIAGNOSIS — R29898 Other symptoms and signs involving the musculoskeletal system: Secondary | ICD-10-CM | POA: Diagnosis not present

## 2015-08-21 NOTE — Telephone Encounter (Signed)
Per Dr Leta Baptist, he wants MRI's sooner. Routed to [employer].

## 2015-08-21 NOTE — Telephone Encounter (Signed)
Patient is scheduled for today 10/27 @ 5PM @ [facility]. Patient is aware of appointment. Thanks!

## 2015-08-22 ENCOUNTER — Ambulatory Visit (INDEPENDENT_AMBULATORY_CARE_PROVIDER_SITE_OTHER): Payer: Medicare Other

## 2015-08-22 ENCOUNTER — Ambulatory Visit (INDEPENDENT_AMBULATORY_CARE_PROVIDER_SITE_OTHER): Payer: Self-pay

## 2015-08-22 ENCOUNTER — Telehealth: Payer: Self-pay | Admitting: Diagnostic Neuroimaging

## 2015-08-22 DIAGNOSIS — R6889 Other general symptoms and signs: Secondary | ICD-10-CM

## 2015-08-22 DIAGNOSIS — Z7982 Long term (current) use of aspirin: Secondary | ICD-10-CM | POA: Diagnosis not present

## 2015-08-22 DIAGNOSIS — R29898 Other symptoms and signs involving the musculoskeletal system: Secondary | ICD-10-CM

## 2015-08-22 DIAGNOSIS — R32 Unspecified urinary incontinence: Secondary | ICD-10-CM | POA: Diagnosis not present

## 2015-08-22 DIAGNOSIS — R292 Abnormal reflex: Secondary | ICD-10-CM

## 2015-08-22 DIAGNOSIS — Z0289 Encounter for other administrative examinations: Secondary | ICD-10-CM

## 2015-08-22 DIAGNOSIS — Z7951 Long term (current) use of inhaled steroids: Secondary | ICD-10-CM | POA: Diagnosis not present

## 2015-08-22 DIAGNOSIS — R269 Unspecified abnormalities of gait and mobility: Secondary | ICD-10-CM

## 2015-08-22 DIAGNOSIS — Z9181 History of falling: Secondary | ICD-10-CM | POA: Diagnosis not present

## 2015-08-22 DIAGNOSIS — Z79899 Other long term (current) drug therapy: Secondary | ICD-10-CM

## 2015-08-22 DIAGNOSIS — M4806 Spinal stenosis, lumbar region: Secondary | ICD-10-CM | POA: Diagnosis not present

## 2015-08-22 DIAGNOSIS — M4712 Other spondylosis with myelopathy, cervical region: Secondary | ICD-10-CM

## 2015-08-22 DIAGNOSIS — M6281 Muscle weakness (generalized): Secondary | ICD-10-CM | POA: Diagnosis not present

## 2015-08-22 DIAGNOSIS — G2581 Restless legs syndrome: Secondary | ICD-10-CM | POA: Diagnosis not present

## 2015-08-22 DIAGNOSIS — M316 Other giant cell arteritis: Secondary | ICD-10-CM | POA: Diagnosis not present

## 2015-08-22 DIAGNOSIS — M4802 Spinal stenosis, cervical region: Secondary | ICD-10-CM | POA: Diagnosis not present

## 2015-08-22 DIAGNOSIS — E785 Hyperlipidemia, unspecified: Secondary | ICD-10-CM | POA: Diagnosis not present

## 2015-08-22 NOTE — Telephone Encounter (Signed)
i called patient re: MRI results. Severe spinal stenosis at C4-5. Patient requests referral to Dr. Arnoldo Lenis ([facility]) who performed surgery on lumbar spine in the past for patient. Will setup expedited referral hopefully for next week. If symptoms worsen in next few days, will recommend ER evaluation for patient. Patient does not want ER eval today.   Penni Bombard, MD 24/19/9144, 4:58 PM Certified in Neurology, Neurophysiology and Neuroimaging  [facility] [address], [place],  [address] [phone]

## 2015-08-23 ENCOUNTER — Other Ambulatory Visit: Payer: Self-pay | Admitting: Family Medicine

## 2015-08-25 ENCOUNTER — Telehealth: Payer: Self-pay | Admitting: Diagnostic Neuroimaging

## 2015-08-25 DIAGNOSIS — M316 Other giant cell arteritis: Secondary | ICD-10-CM | POA: Diagnosis not present

## 2015-08-25 DIAGNOSIS — M6281 Muscle weakness (generalized): Secondary | ICD-10-CM | POA: Diagnosis not present

## 2015-08-25 DIAGNOSIS — G2581 Restless legs syndrome: Secondary | ICD-10-CM | POA: Diagnosis not present

## 2015-08-25 DIAGNOSIS — M4802 Spinal stenosis, cervical region: Secondary | ICD-10-CM | POA: Diagnosis not present

## 2015-08-25 DIAGNOSIS — E785 Hyperlipidemia, unspecified: Secondary | ICD-10-CM | POA: Diagnosis not present

## 2015-08-25 DIAGNOSIS — M4806 Spinal stenosis, lumbar region: Secondary | ICD-10-CM | POA: Diagnosis not present

## 2015-08-25 NOTE — Telephone Encounter (Signed)
Called and spoke to patient his daughter is picking up his disc from [facility] .

## 2015-08-25 NOTE — Telephone Encounter (Signed)
Spoke to Dr. Arnoldo Lenis office at [facility] telephone [phone]. All orders and notes have been faxed. Appointments was going to speak to nurse there and have the Nurse to call Dr.  Nathaneil Canary nurse Verneita Griffes . Patient was seen there this month. And patient no-showed apt.

## 2015-08-25 NOTE — Telephone Encounter (Signed)
Spoke with Colletta Maryland, scheduling at [facility]. She states patient has appointment to be seen tomorrow.

## 2015-08-26 DIAGNOSIS — I440 Atrioventricular block, first degree: Secondary | ICD-10-CM | POA: Diagnosis not present

## 2015-08-26 DIAGNOSIS — Z01818 Encounter for other preprocedural examination: Secondary | ICD-10-CM | POA: Diagnosis not present

## 2015-08-26 DIAGNOSIS — M4802 Spinal stenosis, cervical region: Secondary | ICD-10-CM | POA: Diagnosis not present

## 2015-08-26 DIAGNOSIS — M50021 Cervical disc disorder at C4-C5 level with myelopathy: Secondary | ICD-10-CM | POA: Diagnosis not present

## 2015-08-27 ENCOUNTER — Other Ambulatory Visit (HOSPITAL_COMMUNITY): Payer: Medicare Other

## 2015-08-27 ENCOUNTER — Telehealth: Payer: Self-pay | Admitting: Diagnostic Neuroimaging

## 2015-08-27 NOTE — Telephone Encounter (Signed)
Noted. Needs neurosurg follow up.

## 2015-08-27 NOTE — Telephone Encounter (Signed)
Note:

## 2015-08-27 NOTE — Telephone Encounter (Signed)
[facility], Birdie Sons called and states pt fell on Monday morning, did not get hurt. Did not have active treatment. No more PT visits until new orders are done after pt has surgery.  May call [phone]

## 2015-08-29 ENCOUNTER — Other Ambulatory Visit: Payer: Medicare Other

## 2015-09-01 DIAGNOSIS — M4802 Spinal stenosis, cervical region: Secondary | ICD-10-CM | POA: Diagnosis not present

## 2015-09-01 DIAGNOSIS — M50321 Other cervical disc degeneration at C4-C5 level: Secondary | ICD-10-CM | POA: Diagnosis present

## 2015-09-01 DIAGNOSIS — Z0189 Encounter for other specified special examinations: Secondary | ICD-10-CM | POA: Diagnosis not present

## 2015-09-01 DIAGNOSIS — I10 Essential (primary) hypertension: Secondary | ICD-10-CM | POA: Diagnosis not present

## 2015-09-01 DIAGNOSIS — F17210 Nicotine dependence, cigarettes, uncomplicated: Secondary | ICD-10-CM | POA: Diagnosis present

## 2015-09-01 DIAGNOSIS — G9529 Other cord compression: Secondary | ICD-10-CM | POA: Diagnosis present

## 2015-09-01 HISTORY — PX: CERVICAL SPINE SURGERY: SHX589

## 2015-09-04 ENCOUNTER — Telehealth: Payer: Self-pay | Admitting: Diagnostic Neuroimaging

## 2015-09-04 ENCOUNTER — Ambulatory Visit: Payer: Medicare Other | Admitting: Family Medicine

## 2015-09-04 NOTE — Telephone Encounter (Signed)
Lucky with [facility] is calling regarding the patient. The patient had cervical disc repair on 09-01-15 and discharged to home from [facility] needs orders to resume therapy for the patient. Thank you.

## 2015-09-05 NOTE — Telephone Encounter (Signed)
Orders should come from neurosurgery. -VRP

## 2015-09-05 NOTE — Telephone Encounter (Signed)
I left message for Gwynne Edinger to return my call.  We need to clarify which therapies she feels patient needs to resume.

## 2015-09-05 NOTE — Telephone Encounter (Signed)
Spoke to [place] - she will contact patient's neurosurgeon for orders.

## 2015-09-05 NOTE — Telephone Encounter (Signed)
Lucky with [facility] is returning a call. Gwynne Edinger states the patient needs physical therapy. Thank you.

## 2015-09-07 DIAGNOSIS — M316 Other giant cell arteritis: Secondary | ICD-10-CM | POA: Diagnosis not present

## 2015-09-07 DIAGNOSIS — M4802 Spinal stenosis, cervical region: Secondary | ICD-10-CM | POA: Diagnosis not present

## 2015-09-07 DIAGNOSIS — E785 Hyperlipidemia, unspecified: Secondary | ICD-10-CM | POA: Diagnosis not present

## 2015-09-07 DIAGNOSIS — M4806 Spinal stenosis, lumbar region: Secondary | ICD-10-CM | POA: Diagnosis not present

## 2015-09-07 DIAGNOSIS — G2581 Restless legs syndrome: Secondary | ICD-10-CM | POA: Diagnosis not present

## 2015-09-07 DIAGNOSIS — M6281 Muscle weakness (generalized): Secondary | ICD-10-CM | POA: Diagnosis not present

## 2015-09-08 ENCOUNTER — Other Ambulatory Visit: Payer: Self-pay | Admitting: Family Medicine

## 2015-09-09 DIAGNOSIS — M316 Other giant cell arteritis: Secondary | ICD-10-CM | POA: Diagnosis not present

## 2015-09-10 DIAGNOSIS — M4806 Spinal stenosis, lumbar region: Secondary | ICD-10-CM | POA: Diagnosis not present

## 2015-09-10 DIAGNOSIS — E785 Hyperlipidemia, unspecified: Secondary | ICD-10-CM | POA: Diagnosis not present

## 2015-09-10 DIAGNOSIS — M316 Other giant cell arteritis: Secondary | ICD-10-CM | POA: Diagnosis not present

## 2015-09-10 DIAGNOSIS — M6281 Muscle weakness (generalized): Secondary | ICD-10-CM | POA: Diagnosis not present

## 2015-09-10 DIAGNOSIS — G2581 Restless legs syndrome: Secondary | ICD-10-CM | POA: Diagnosis not present

## 2015-09-10 DIAGNOSIS — M4802 Spinal stenosis, cervical region: Secondary | ICD-10-CM | POA: Diagnosis not present

## 2015-09-12 DIAGNOSIS — M6281 Muscle weakness (generalized): Secondary | ICD-10-CM | POA: Diagnosis not present

## 2015-09-12 DIAGNOSIS — E785 Hyperlipidemia, unspecified: Secondary | ICD-10-CM | POA: Diagnosis not present

## 2015-09-12 DIAGNOSIS — M316 Other giant cell arteritis: Secondary | ICD-10-CM | POA: Diagnosis not present

## 2015-09-12 DIAGNOSIS — M4806 Spinal stenosis, lumbar region: Secondary | ICD-10-CM | POA: Diagnosis not present

## 2015-09-12 DIAGNOSIS — G2581 Restless legs syndrome: Secondary | ICD-10-CM | POA: Diagnosis not present

## 2015-09-12 DIAGNOSIS — M4802 Spinal stenosis, cervical region: Secondary | ICD-10-CM | POA: Diagnosis not present

## 2015-09-15 DIAGNOSIS — M6281 Muscle weakness (generalized): Secondary | ICD-10-CM | POA: Diagnosis not present

## 2015-09-15 DIAGNOSIS — M4806 Spinal stenosis, lumbar region: Secondary | ICD-10-CM | POA: Diagnosis not present

## 2015-09-15 DIAGNOSIS — M316 Other giant cell arteritis: Secondary | ICD-10-CM | POA: Diagnosis not present

## 2015-09-15 DIAGNOSIS — M4802 Spinal stenosis, cervical region: Secondary | ICD-10-CM | POA: Diagnosis not present

## 2015-09-15 DIAGNOSIS — G2581 Restless legs syndrome: Secondary | ICD-10-CM | POA: Diagnosis not present

## 2015-09-15 DIAGNOSIS — E785 Hyperlipidemia, unspecified: Secondary | ICD-10-CM | POA: Diagnosis not present

## 2015-09-23 ENCOUNTER — Other Ambulatory Visit (HOSPITAL_COMMUNITY): Payer: Self-pay | Admitting: Neurosurgery

## 2015-09-23 ENCOUNTER — Ambulatory Visit (HOSPITAL_COMMUNITY)
Admission: RE | Admit: 2015-09-23 | Discharge: 2015-09-23 | Disposition: A | Discharge status: Home / Self Care | Payer: Medicare Other | Source: Ambulatory Visit | Attending: Neurosurgery | Admitting: Neurosurgery

## 2015-09-23 DIAGNOSIS — Z981 Arthrodesis status: Secondary | ICD-10-CM

## 2015-09-23 DIAGNOSIS — M50323 Other cervical disc degeneration at C6-C7 level: Secondary | ICD-10-CM | POA: Insufficient documentation

## 2015-09-23 DIAGNOSIS — G2581 Restless legs syndrome: Secondary | ICD-10-CM | POA: Diagnosis not present

## 2015-09-23 DIAGNOSIS — M316 Other giant cell arteritis: Secondary | ICD-10-CM | POA: Diagnosis not present

## 2015-09-23 DIAGNOSIS — M50322 Other cervical disc degeneration at C5-C6 level: Secondary | ICD-10-CM | POA: Diagnosis not present

## 2015-09-23 DIAGNOSIS — E785 Hyperlipidemia, unspecified: Secondary | ICD-10-CM | POA: Diagnosis not present

## 2015-09-23 DIAGNOSIS — M4802 Spinal stenosis, cervical region: Secondary | ICD-10-CM | POA: Diagnosis not present

## 2015-09-23 DIAGNOSIS — M4806 Spinal stenosis, lumbar region: Secondary | ICD-10-CM | POA: Diagnosis not present

## 2015-09-23 DIAGNOSIS — M6281 Muscle weakness (generalized): Secondary | ICD-10-CM | POA: Diagnosis not present

## 2015-09-25 DIAGNOSIS — G2581 Restless legs syndrome: Secondary | ICD-10-CM | POA: Diagnosis not present

## 2015-09-25 DIAGNOSIS — M316 Other giant cell arteritis: Secondary | ICD-10-CM | POA: Diagnosis not present

## 2015-09-25 DIAGNOSIS — M4806 Spinal stenosis, lumbar region: Secondary | ICD-10-CM | POA: Diagnosis not present

## 2015-09-25 DIAGNOSIS — E785 Hyperlipidemia, unspecified: Secondary | ICD-10-CM | POA: Diagnosis not present

## 2015-09-25 DIAGNOSIS — M4802 Spinal stenosis, cervical region: Secondary | ICD-10-CM | POA: Diagnosis not present

## 2015-09-25 DIAGNOSIS — M6281 Muscle weakness (generalized): Secondary | ICD-10-CM | POA: Diagnosis not present

## 2015-09-29 ENCOUNTER — Other Ambulatory Visit: Payer: Self-pay | Admitting: Family Medicine

## 2015-09-30 DIAGNOSIS — M4806 Spinal stenosis, lumbar region: Secondary | ICD-10-CM | POA: Diagnosis not present

## 2015-09-30 DIAGNOSIS — E785 Hyperlipidemia, unspecified: Secondary | ICD-10-CM | POA: Diagnosis not present

## 2015-09-30 DIAGNOSIS — G2581 Restless legs syndrome: Secondary | ICD-10-CM | POA: Diagnosis not present

## 2015-09-30 DIAGNOSIS — M4802 Spinal stenosis, cervical region: Secondary | ICD-10-CM | POA: Diagnosis not present

## 2015-09-30 DIAGNOSIS — M316 Other giant cell arteritis: Secondary | ICD-10-CM | POA: Diagnosis not present

## 2015-09-30 DIAGNOSIS — M6281 Muscle weakness (generalized): Secondary | ICD-10-CM | POA: Diagnosis not present

## 2015-10-02 DIAGNOSIS — G2581 Restless legs syndrome: Secondary | ICD-10-CM | POA: Diagnosis not present

## 2015-10-02 DIAGNOSIS — E785 Hyperlipidemia, unspecified: Secondary | ICD-10-CM | POA: Diagnosis not present

## 2015-10-02 DIAGNOSIS — M4806 Spinal stenosis, lumbar region: Secondary | ICD-10-CM | POA: Diagnosis not present

## 2015-10-02 DIAGNOSIS — M316 Other giant cell arteritis: Secondary | ICD-10-CM | POA: Diagnosis not present

## 2015-10-02 DIAGNOSIS — M6281 Muscle weakness (generalized): Secondary | ICD-10-CM | POA: Diagnosis not present

## 2015-10-02 DIAGNOSIS — M4802 Spinal stenosis, cervical region: Secondary | ICD-10-CM | POA: Diagnosis not present

## 2015-10-07 DIAGNOSIS — M4806 Spinal stenosis, lumbar region: Secondary | ICD-10-CM | POA: Diagnosis not present

## 2015-10-07 DIAGNOSIS — M316 Other giant cell arteritis: Secondary | ICD-10-CM | POA: Diagnosis not present

## 2015-10-07 DIAGNOSIS — E785 Hyperlipidemia, unspecified: Secondary | ICD-10-CM | POA: Diagnosis not present

## 2015-10-07 DIAGNOSIS — M6281 Muscle weakness (generalized): Secondary | ICD-10-CM | POA: Diagnosis not present

## 2015-10-07 DIAGNOSIS — G2581 Restless legs syndrome: Secondary | ICD-10-CM | POA: Diagnosis not present

## 2015-10-07 DIAGNOSIS — M4802 Spinal stenosis, cervical region: Secondary | ICD-10-CM | POA: Diagnosis not present

## 2015-10-09 DIAGNOSIS — G2581 Restless legs syndrome: Secondary | ICD-10-CM | POA: Diagnosis not present

## 2015-10-09 DIAGNOSIS — M6281 Muscle weakness (generalized): Secondary | ICD-10-CM | POA: Diagnosis not present

## 2015-10-09 DIAGNOSIS — M316 Other giant cell arteritis: Secondary | ICD-10-CM | POA: Diagnosis not present

## 2015-10-09 DIAGNOSIS — M4802 Spinal stenosis, cervical region: Secondary | ICD-10-CM | POA: Diagnosis not present

## 2015-10-09 DIAGNOSIS — E785 Hyperlipidemia, unspecified: Secondary | ICD-10-CM | POA: Diagnosis not present

## 2015-10-09 DIAGNOSIS — M4806 Spinal stenosis, lumbar region: Secondary | ICD-10-CM | POA: Diagnosis not present

## 2015-10-13 DIAGNOSIS — M4806 Spinal stenosis, lumbar region: Secondary | ICD-10-CM | POA: Diagnosis not present

## 2015-10-13 DIAGNOSIS — G2581 Restless legs syndrome: Secondary | ICD-10-CM | POA: Diagnosis not present

## 2015-10-13 DIAGNOSIS — M4802 Spinal stenosis, cervical region: Secondary | ICD-10-CM | POA: Diagnosis not present

## 2015-10-13 DIAGNOSIS — M6281 Muscle weakness (generalized): Secondary | ICD-10-CM | POA: Diagnosis not present

## 2015-10-13 DIAGNOSIS — E785 Hyperlipidemia, unspecified: Secondary | ICD-10-CM | POA: Diagnosis not present

## 2015-10-13 DIAGNOSIS — M316 Other giant cell arteritis: Secondary | ICD-10-CM | POA: Diagnosis not present

## 2015-10-14 ENCOUNTER — Encounter: Payer: Self-pay | Admitting: Family Medicine

## 2015-10-14 ENCOUNTER — Ambulatory Visit (INDEPENDENT_AMBULATORY_CARE_PROVIDER_SITE_OTHER): Payer: Medicare Other | Admitting: Family Medicine

## 2015-10-14 ENCOUNTER — Ambulatory Visit (HOSPITAL_COMMUNITY)
Admission: RE | Admit: 2015-10-14 | Discharge: 2015-10-14 | Disposition: A | Discharge status: Home / Self Care | Payer: Medicare Other | Source: Ambulatory Visit | Attending: Family Medicine | Admitting: Family Medicine

## 2015-10-14 VITALS — BP 118/76 | Ht 69.5 in | Wt 162.5 lb

## 2015-10-14 DIAGNOSIS — M179 Osteoarthritis of knee, unspecified: Secondary | ICD-10-CM | POA: Diagnosis not present

## 2015-10-14 DIAGNOSIS — D692 Other nonthrombocytopenic purpura: Secondary | ICD-10-CM

## 2015-10-14 DIAGNOSIS — M4802 Spinal stenosis, cervical region: Secondary | ICD-10-CM | POA: Insufficient documentation

## 2015-10-14 DIAGNOSIS — M1711 Unilateral primary osteoarthritis, right knee: Secondary | ICD-10-CM | POA: Diagnosis not present

## 2015-10-14 DIAGNOSIS — M25561 Pain in right knee: Secondary | ICD-10-CM | POA: Insufficient documentation

## 2015-10-14 DIAGNOSIS — M1712 Unilateral primary osteoarthritis, left knee: Secondary | ICD-10-CM | POA: Diagnosis not present

## 2015-10-14 DIAGNOSIS — M25562 Pain in left knee: Secondary | ICD-10-CM | POA: Diagnosis not present

## 2015-10-14 DIAGNOSIS — R059 Cough, unspecified: Secondary | ICD-10-CM

## 2015-10-14 DIAGNOSIS — R05 Cough: Secondary | ICD-10-CM

## 2015-10-14 DIAGNOSIS — M25551 Pain in right hip: Secondary | ICD-10-CM

## 2015-10-14 DIAGNOSIS — I10 Essential (primary) hypertension: Secondary | ICD-10-CM | POA: Diagnosis not present

## 2015-10-14 DIAGNOSIS — M1611 Unilateral primary osteoarthritis, right hip: Secondary | ICD-10-CM | POA: Diagnosis not present

## 2015-10-14 MED ORDER — CYCLOBENZAPRINE HCL 10 MG PO TABS: 10.0000 mg | ORAL_TABLET | Freq: Every day | ORAL | Status: DC

## 2015-10-14 NOTE — Progress Notes (Signed)
   Subjective:    Patient ID: Gerald Hall, male    DOB: January 10, 1935, 80 y.o.   MRN: [id number]  Hypertension This is a chronic problem. The current episode started more than 1 year ago. There are no compliance problems.     Patient states has c/o of nocturnal right leg spasms and neck pain in the mornings.  patient relates his leg jerks at times spasms at times pain times patient has had cervical spine surgery because of cervical stenosis. He is gradually getting better from this. Still walking with a walker. Patient still smokes. States his appetite is fair denies being depressed. Relates burning discomfort aching discomfort in his right leg from his hip to his knee he's worried about arthritis in his hip and knees patient denies any injury. He also gets neck pain radiates into the left trapezius region does not run down the arm no numbness or tingling neck denies shortness of breath or chest pressure. Review of Systems  see above.    Patient still on prednisone for temporal arteritis followed by specialist Objective:   Physical Exam  lungs are clear hearts regular  lungs are clear hearts regular pulse normal patient has lost a fair amount of muscle mass especially in his legs. Patient relates is getting weaker with his legs but he is doing physical therapy. 25 minutes was spent with the patient. Greater than half the time was spent in discussion and answering questions and counseling regarding the issues that the patient came in for today.   patient relates he has a live-in aide who is helping both him and his wife    Assessment & Plan:   probable nerve impingement in his lumbar spine radiating into the right leg he is tried gabapentin in the past without success   Flexeril at nighttime to see if that helps with the spasms in his right leg might help him sleep better to caution drowsiness cut in half if too drowsy patient understands this  possible arthritis in the hip and knees x-rays  recommended  Occasional cough smoker check chest x-ray    patient advised quit smoking unlikely to do so   patient to continue physical therapy I believe this is helping him after his recent surgery  Follow-up 3 months

## 2015-10-16 DIAGNOSIS — G2581 Restless legs syndrome: Secondary | ICD-10-CM | POA: Diagnosis not present

## 2015-10-16 DIAGNOSIS — M316 Other giant cell arteritis: Secondary | ICD-10-CM | POA: Diagnosis not present

## 2015-10-16 DIAGNOSIS — E785 Hyperlipidemia, unspecified: Secondary | ICD-10-CM | POA: Diagnosis not present

## 2015-10-16 DIAGNOSIS — M4806 Spinal stenosis, lumbar region: Secondary | ICD-10-CM | POA: Diagnosis not present

## 2015-10-16 DIAGNOSIS — M4802 Spinal stenosis, cervical region: Secondary | ICD-10-CM | POA: Diagnosis not present

## 2015-10-16 DIAGNOSIS — M6281 Muscle weakness (generalized): Secondary | ICD-10-CM | POA: Diagnosis not present

## 2015-10-21 DIAGNOSIS — G2581 Restless legs syndrome: Secondary | ICD-10-CM | POA: Diagnosis not present

## 2015-10-21 DIAGNOSIS — M4802 Spinal stenosis, cervical region: Secondary | ICD-10-CM | POA: Diagnosis not present

## 2015-10-21 DIAGNOSIS — M4806 Spinal stenosis, lumbar region: Secondary | ICD-10-CM | POA: Diagnosis not present

## 2015-10-21 DIAGNOSIS — E785 Hyperlipidemia, unspecified: Secondary | ICD-10-CM | POA: Diagnosis not present

## 2015-10-21 DIAGNOSIS — M316 Other giant cell arteritis: Secondary | ICD-10-CM | POA: Diagnosis not present

## 2015-10-21 DIAGNOSIS — M6281 Muscle weakness (generalized): Secondary | ICD-10-CM | POA: Diagnosis not present

## 2015-10-21 DIAGNOSIS — R32 Unspecified urinary incontinence: Secondary | ICD-10-CM | POA: Diagnosis not present

## 2015-10-21 DIAGNOSIS — Z9181 History of falling: Secondary | ICD-10-CM | POA: Diagnosis not present

## 2015-10-21 DIAGNOSIS — Z7951 Long term (current) use of inhaled steroids: Secondary | ICD-10-CM | POA: Diagnosis not present

## 2015-10-21 DIAGNOSIS — Z7982 Long term (current) use of aspirin: Secondary | ICD-10-CM | POA: Diagnosis not present

## 2015-10-22 DIAGNOSIS — M4802 Spinal stenosis, cervical region: Secondary | ICD-10-CM | POA: Diagnosis not present

## 2015-10-22 DIAGNOSIS — E785 Hyperlipidemia, unspecified: Secondary | ICD-10-CM | POA: Diagnosis not present

## 2015-10-22 DIAGNOSIS — G2581 Restless legs syndrome: Secondary | ICD-10-CM | POA: Diagnosis not present

## 2015-10-22 DIAGNOSIS — M6281 Muscle weakness (generalized): Secondary | ICD-10-CM | POA: Diagnosis not present

## 2015-10-22 DIAGNOSIS — M4806 Spinal stenosis, lumbar region: Secondary | ICD-10-CM | POA: Diagnosis not present

## 2015-10-22 DIAGNOSIS — M316 Other giant cell arteritis: Secondary | ICD-10-CM | POA: Diagnosis not present

## 2015-10-28 ENCOUNTER — Other Ambulatory Visit: Payer: Self-pay | Admitting: Family Medicine

## 2015-10-28 DIAGNOSIS — G2581 Restless legs syndrome: Secondary | ICD-10-CM | POA: Diagnosis not present

## 2015-10-28 DIAGNOSIS — M4806 Spinal stenosis, lumbar region: Secondary | ICD-10-CM | POA: Diagnosis not present

## 2015-10-28 DIAGNOSIS — M316 Other giant cell arteritis: Secondary | ICD-10-CM | POA: Diagnosis not present

## 2015-10-28 DIAGNOSIS — M4802 Spinal stenosis, cervical region: Secondary | ICD-10-CM | POA: Diagnosis not present

## 2015-10-28 DIAGNOSIS — E785 Hyperlipidemia, unspecified: Secondary | ICD-10-CM | POA: Diagnosis not present

## 2015-10-28 DIAGNOSIS — M6281 Muscle weakness (generalized): Secondary | ICD-10-CM | POA: Diagnosis not present

## 2015-10-30 DIAGNOSIS — G2581 Restless legs syndrome: Secondary | ICD-10-CM | POA: Diagnosis not present

## 2015-10-30 DIAGNOSIS — M6281 Muscle weakness (generalized): Secondary | ICD-10-CM | POA: Diagnosis not present

## 2015-10-30 DIAGNOSIS — M4802 Spinal stenosis, cervical region: Secondary | ICD-10-CM | POA: Diagnosis not present

## 2015-10-30 DIAGNOSIS — M4806 Spinal stenosis, lumbar region: Secondary | ICD-10-CM | POA: Diagnosis not present

## 2015-10-30 DIAGNOSIS — E785 Hyperlipidemia, unspecified: Secondary | ICD-10-CM | POA: Diagnosis not present

## 2015-10-30 DIAGNOSIS — M316 Other giant cell arteritis: Secondary | ICD-10-CM | POA: Diagnosis not present

## 2015-10-31 ENCOUNTER — Other Ambulatory Visit: Payer: Self-pay | Admitting: *Deleted

## 2015-11-02 NOTE — Progress Notes (Signed)
May have this and 4 refills 

## 2015-11-03 MED ORDER — LORAZEPAM 1 MG PO TABS: 0.5000 mg | ORAL_TABLET | Freq: Every evening | ORAL | Status: DC | PRN

## 2015-11-04 DIAGNOSIS — G2581 Restless legs syndrome: Secondary | ICD-10-CM | POA: Diagnosis not present

## 2015-11-04 DIAGNOSIS — M6281 Muscle weakness (generalized): Secondary | ICD-10-CM | POA: Diagnosis not present

## 2015-11-04 DIAGNOSIS — M4806 Spinal stenosis, lumbar region: Secondary | ICD-10-CM | POA: Diagnosis not present

## 2015-11-04 DIAGNOSIS — M316 Other giant cell arteritis: Secondary | ICD-10-CM | POA: Diagnosis not present

## 2015-11-04 DIAGNOSIS — E785 Hyperlipidemia, unspecified: Secondary | ICD-10-CM | POA: Diagnosis not present

## 2015-11-04 DIAGNOSIS — M4802 Spinal stenosis, cervical region: Secondary | ICD-10-CM | POA: Diagnosis not present

## 2015-11-06 DIAGNOSIS — M4802 Spinal stenosis, cervical region: Secondary | ICD-10-CM | POA: Diagnosis not present

## 2015-11-06 DIAGNOSIS — M4806 Spinal stenosis, lumbar region: Secondary | ICD-10-CM | POA: Diagnosis not present

## 2015-11-06 DIAGNOSIS — G2581 Restless legs syndrome: Secondary | ICD-10-CM | POA: Diagnosis not present

## 2015-11-06 DIAGNOSIS — M316 Other giant cell arteritis: Secondary | ICD-10-CM | POA: Diagnosis not present

## 2015-11-06 DIAGNOSIS — E785 Hyperlipidemia, unspecified: Secondary | ICD-10-CM | POA: Diagnosis not present

## 2015-11-06 DIAGNOSIS — M6281 Muscle weakness (generalized): Secondary | ICD-10-CM | POA: Diagnosis not present

## 2015-11-10 DIAGNOSIS — E785 Hyperlipidemia, unspecified: Secondary | ICD-10-CM | POA: Diagnosis not present

## 2015-11-10 DIAGNOSIS — G2581 Restless legs syndrome: Secondary | ICD-10-CM | POA: Diagnosis not present

## 2015-11-10 DIAGNOSIS — M4806 Spinal stenosis, lumbar region: Secondary | ICD-10-CM | POA: Diagnosis not present

## 2015-11-10 DIAGNOSIS — M4802 Spinal stenosis, cervical region: Secondary | ICD-10-CM | POA: Diagnosis not present

## 2015-11-10 DIAGNOSIS — M316 Other giant cell arteritis: Secondary | ICD-10-CM | POA: Diagnosis not present

## 2015-11-10 DIAGNOSIS — M6281 Muscle weakness (generalized): Secondary | ICD-10-CM | POA: Diagnosis not present

## 2015-11-13 DIAGNOSIS — M316 Other giant cell arteritis: Secondary | ICD-10-CM | POA: Diagnosis not present

## 2015-11-14 DIAGNOSIS — M6281 Muscle weakness (generalized): Secondary | ICD-10-CM | POA: Diagnosis not present

## 2015-11-14 DIAGNOSIS — E785 Hyperlipidemia, unspecified: Secondary | ICD-10-CM | POA: Diagnosis not present

## 2015-11-14 DIAGNOSIS — G2581 Restless legs syndrome: Secondary | ICD-10-CM | POA: Diagnosis not present

## 2015-11-14 DIAGNOSIS — M4802 Spinal stenosis, cervical region: Secondary | ICD-10-CM | POA: Diagnosis not present

## 2015-11-14 DIAGNOSIS — M4806 Spinal stenosis, lumbar region: Secondary | ICD-10-CM | POA: Diagnosis not present

## 2015-11-14 DIAGNOSIS — M316 Other giant cell arteritis: Secondary | ICD-10-CM | POA: Diagnosis not present

## 2015-11-17 DIAGNOSIS — M4806 Spinal stenosis, lumbar region: Secondary | ICD-10-CM | POA: Diagnosis not present

## 2015-11-17 DIAGNOSIS — M4802 Spinal stenosis, cervical region: Secondary | ICD-10-CM | POA: Diagnosis not present

## 2015-11-17 DIAGNOSIS — G2581 Restless legs syndrome: Secondary | ICD-10-CM | POA: Diagnosis not present

## 2015-11-17 DIAGNOSIS — M6281 Muscle weakness (generalized): Secondary | ICD-10-CM | POA: Diagnosis not present

## 2015-11-17 DIAGNOSIS — E785 Hyperlipidemia, unspecified: Secondary | ICD-10-CM | POA: Diagnosis not present

## 2015-11-17 DIAGNOSIS — M316 Other giant cell arteritis: Secondary | ICD-10-CM | POA: Diagnosis not present

## 2015-11-18 ENCOUNTER — Ambulatory Visit (INDEPENDENT_AMBULATORY_CARE_PROVIDER_SITE_OTHER): Payer: Medicare Other | Admitting: Diagnostic Neuroimaging

## 2015-11-18 ENCOUNTER — Encounter: Payer: Self-pay | Admitting: Diagnostic Neuroimaging

## 2015-11-18 VITALS — BP 140/89 | HR 89 | Ht 69.5 in | Wt 167.2 lb

## 2015-11-18 DIAGNOSIS — M4712 Other spondylosis with myelopathy, cervical region: Secondary | ICD-10-CM | POA: Diagnosis not present

## 2015-11-18 NOTE — Progress Notes (Signed)
[facility]  PATIENT: Gerald Hall DOB: 1935/01/14  REFERRING CLINICIAN: Luking HISTORY FROM: patient and daughter  REASON FOR VISIT: follow up   HISTORICAL  CHIEF COMPLAINT:  Chief Complaint  Patient presents with  . Gait Problem    rm 6, dgtr [name], "Right leg pain w/standing, doing PT 2 x wk"  . Follow-up    3 month    HISTORY OF PRESENT ILLNESS:   UPDATE 11/18/15: Since last visit, dx'd with spinal cord compression at C4-5 and now s/p decompression by Dr. Raliegh Ip at [facility] on 09/01/15 with good results. Improved gait and mobility. Still with mild neck stiffness and right hip/leg pain.   PRIOR HPI (08/18/15): 81 year old right-handed male here for evaluation of lower extremity weakness. In 2006 patient had onset of bilateral knee pain. In 2007 he was having pain in his shins. Around this time he was diagnosed with lumbar spinal stenosis. This is managed conservatively until 2010 when he underwent surgical treatment. He had some improvement in lower extremity pain and gait. Over the next few years he gradually worsened. 2016 he started having increasing weakness in his legs. He was having to use a walker. Over the past 2-3 months he has now developed weakness in his arms. April 2015 patient was diagnosed with left temporal arteritis on a clinical basis. Use having left-sided pain in and around his left eye. Also had biopsy of the left temporal artery 2 which was negative. He was treated with prednisone with significant helped his symptoms. He was gradually weaned off and has done well. No recurrence of left-sided headaches. Patient denies any speech or swallowing difficulties. No double vision. No numbness or tingling.   REVIEW OF SYSTEMS: Full 14 system review of systems performed and notable only for joint pain aching muscles incontinence easy bruising easy bleeding memory loss insomnia restless legs.   ALLERGIES: No Known Allergies  HOME  MEDICATIONS: Outpatient Prescriptions Prior to Visit  Medication Sig Dispense Refill  . Acetaminophen (TYLENOL) 325 MG CAPS Take by mouth.    Marland Kitchen aspirin EC 81 MG tablet Take 81 mg by mouth daily.    . cyclobenzaprine (FLEXERIL) 10 MG tablet Take 1 tablet (10 mg total) by mouth at bedtime. 30 tablet 3  . fluticasone (FLONASE) 50 MCG/ACT nasal spray INHALE TWO SPRAYS IN EACH NOSTRIL EVERY DAY. 16 g 5  . LORazepam (ATIVAN) 1 MG tablet Take 0.5-1 tablets (0.5-1 mg total) by mouth at bedtime as needed (insomnia). 30 tablet 4  . OVER THE COUNTER MEDICATION     . OVER THE COUNTER MEDICATION Stool softner qd prn    . potassium chloride (K-DUR) 10 MEQ tablet TAKE 1 TABLET ONCE DAILY 30 tablet 5  . predniSONE (DELTASONE) 2.5 MG tablet Take 2.5 mg by mouth daily with breakfast.    . tamsulosin (FLOMAX) 0.4 MG CAPS capsule Take 0.4 mg by mouth daily.    . traMADol (ULTRAM) 50 MG tablet Take 1 tablet (50 mg total) by mouth every 6 (six) hours as needed. 120 tablet 4  . triamterene-hydrochlorothiazide (MAXZIDE-25) 37.5-25 MG tablet TAKE 1 TABLET ONCE DAILY 30 tablet 5   No facility-administered medications prior to visit.    PAST MEDICAL HISTORY: Past Medical History  Diagnosis Date  . Hyperlipidemia   . Blood in urine     Sees urology yearly  . Deafness in right ear age 76    PAST SURGICAL HISTORY: Past Surgical History  Procedure Laterality Date  . Back surgery  2010  . Knee surgery Left     around 2000, arthroscopic  . Cataract extraction Bilateral 07/30/14, 08/13/2014  . Colonoscopy  12/19/2007    RMR: 1. External hemorrohids, otherwise normal rectum.2. Normal colon. 3. Normal terminal ileum.  . Colonoscopy N/A 12/13/2014    Procedure: COLONOSCOPY;  Surgeon: Daneil Dolin, MD;  Location: [facility];  Service: Endoscopy;  Laterality: N/A;  11:15 Pt Request Time  . Cervical spine surgery  09/01/15    C 4-5 , [facility] , [place]    FAMILY HISTORY: Family History  Problem Relation  Age of Onset  . Cancer Brother     colon  . Diabetes Brother   . Diabetes Brother     SOCIAL HISTORY:  Social History   Social History  . Marital Status: Married    Spouse Name: Pamala Hurry  . Number of Children: 2  . Years of Education: 16   Occupational History  .      retired Software engineer   Social History Main Topics  . Smoking status: Current Every Day Smoker -- 0.50 packs/day    Types: Cigarettes  . Smokeless tobacco: Not on file     Comment: 08/18/15 1/2 - 3/4 pack cigarettes daily  . Alcohol Use: 0.0 oz/week    0 Standard drinks or equivalent per week     Comment: occas  . Drug Use: No  . Sexual Activity: Not on file   Other Topics Concern  . Not on file   Social History Narrative   Lives at home with wife, who has PD   Caffeine use- coffee 8-10 cups daily     PHYSICAL EXAM  GENERAL EXAM/CONSTITUTIONAL: Vitals:  Filed Vitals:   11/18/15 1307  BP: 140/89  Pulse: 89  Height: 5' 9.5" (1.765 m)  Weight: 167 lb 3.2 oz (75.841 kg)   Body mass index is 24.35 kg/(m^2). No exam data present  Patient is in no distress; well developed, nourished and groomed; neck is supple  CARDIOVASCULAR:  Examination of carotid arteries is normal; no carotid bruits  Regular rate and rhythm, no murmurs  Examination of peripheral vascular system by observation and palpation is normal  EYES:  Ophthalmoscopic exam of optic discs and posterior segments is normal; no papilledema or hemorrhages  MUSCULOSKELETAL:  Gait, strength, tone, movements noted in Neurologic exam below  NEUROLOGIC: MENTAL STATUS:  No flowsheet data found.  awake, alert, oriented to person, place and time  recent and remote memory intact  normal attention and concentration  language fluent, comprehension intact, naming intact,   fund of knowledge appropriate  CRANIAL NERVE:   2nd, 3rd, 4th, 6th - pupils equal and reactive to light, visual fields full to confrontation, extraocular muscles  intact, no nystagmus  5th - facial sensation symmetric  7th - facial strength symmetric  8th - hearing intact  9th - palate elevates symmetrically, uvula midline  11th - shoulder shrug symmetric  12th - tongue protrusion midline  MOTOR:   normal bulk and tone, full strength in the BUE; BLE (HF 4+, KE/KF 5, DF 5); RIGHT LEG INCREASED TONE  SENSORY:   normal and symmetric to light touch, pinprick, temperature; DECR VIB AT TOES  COORDINATION:   finger-nose-finger, fine finger movements normal  REFLEXES:   deep tendon reflexes present  GAIT/STATION:   SLOW TO STAND; UNSTEADY; RIGHT LEG TURNED OUTWARD; SLIGHTLY HEMIPARETIC GAIT; USES ROLLING WALKER    DIAGNOSTIC DATA (LABS, IMAGING, TESTING) - I reviewed patient records, labs, notes, testing and  imaging myself where available.  Lab Results  Component Value Date   WBC 9.2 08/06/2015   HGB 16.2 01/06/2015   HCT 48.0 08/06/2015   MCV 91 08/06/2015   PLT 227 08/06/2015      Component Value Date/Time   NA 139 08/06/2015 1101   NA 140 09/06/2014 1259   K 3.9 08/06/2015 1101   CL 95* 08/06/2015 1101   CO2 28 08/06/2015 1101   GLUCOSE 88 08/06/2015 1101   GLUCOSE 90 09/06/2014 1259   BUN 14 08/06/2015 1101   BUN 17 09/06/2014 1259   CREATININE 1.08 08/06/2015 1101   CREATININE 1.14 09/06/2014 1259   CALCIUM 10.0 08/06/2015 1101   PROT 6.3 09/11/2013 1008   ALBUMIN 3.7 09/11/2013 1008   AST 12 09/11/2013 1008   ALT 11 09/11/2013 1008   ALKPHOS 75 09/11/2013 1008   BILITOT 0.8 09/11/2013 1008   GFRNONAA 66 08/06/2015 1101   GFRAA 76 08/06/2015 1101   Lab Results  Component Value Date   CHOL 171 09/06/2014   HDL 46 09/06/2014   LDLCALC 96 09/06/2014   TRIG 145 09/06/2014   CHOLHDL 3.7 09/06/2014   Lab Results  Component Value Date   HGBA1C 5.6 08/18/2015   No results found for: VITAMINB12 Lab Results  Component Value Date   TSH 0.752 08/06/2015    08/08/15 MRI brain [I reviewed images myself and  agree with interpretation. -VRP]  1. Moderate to advanced chronic microvascular ischemia. No acute infarct or mass. 2. Right posterior nasopharyngeal cyst again noted.  02/07/15 MRI lumbar [I reviewed images myself and agree with interpretation. -VRP]  1. Spondylosis most notable at L4-5 where there is new facet mediated 0.5 cm anterolisthesis. Disc bulge much more prominent to the left and facet arthropathy result in severe left and mild right foraminal narrowing, increased since the prior examination. 2. Interval resolution of a small foraminal protrusion on the left at L2-3.  08/22/15 MRI cervical spine (without) [I reviewed images myself and agree with interpretation. -VRP]  1. At C4-5: disc bulging, posterior central disc protrusion, facet hypertrophy with severe spinal stenosis and cord compression and severe biforaminal stenosis; AP diameter of spinal canal is reduced to 60mm. Mild T2 hyperintense signal noted within the spinal cord may reflext edema or gliosis.  2. At C6-7: disc bulging and uncovertebral joint hypertrophy and facet hypertrophy with mild spinal stenosis and severe biforaminal stenosis. 3. At C3-4: disc bulging and uncovertebral joint hypertrophy with severe biforaminal stenosis. 4. At C5-6: uncovertebral joint hypertrophy and facet hypertrophy with severe biforaminal stenosis. 5. At T1-2: left posterior para-central disc protrusion and facet hypertrophy with severe biforaminal stenosis 6. At C7-T1: uncovertebral joint hypertrophy with moderate biforaminal stenosis. 7. Degenerative pannus formation posterior to the odontoid process. Cystic degeneration within the posterior dens. Edema encircling the dens.     ASSESSMENT AND PLAN  81 y.o. year old male here with progressive lower extremity weakness and pain since 2006, diagnosed with spinal stenosis and treated in 2010, now with 6-12 months of progressive muscle weakness in lower extremities and most recently in his arms.  Also notable for some more weakness in the right compared to left leg with hyperreflexia and upgoing toe on the right side. Also with positive right Hoffmans sign. Findings and MRI consistent with spinal cord compression (C4-5) and now status post decompression adn surgery.   Dx: Spondylosis, cervical, with myelopathy   PLAN: - home safety precaution and home health physical therapy - follow up with PCP  and neurosurgery  Return if symptoms worsen or fail to improve, for return to PCP.    Penni Bombard, MD [id number], [id number] PM Certified in Neurology, Neurophysiology and Neuroimaging  [facility] [address], [place], [place] [address] [phone]

## 2015-11-18 NOTE — Patient Instructions (Signed)
-   follow up with Dr. Wolfgang Phoenix and neurosurgery

## 2015-11-21 DIAGNOSIS — B351 Tinea unguium: Secondary | ICD-10-CM | POA: Diagnosis not present

## 2015-11-21 DIAGNOSIS — Q828 Other specified congenital malformations of skin: Secondary | ICD-10-CM | POA: Diagnosis not present

## 2015-11-25 DIAGNOSIS — M316 Other giant cell arteritis: Secondary | ICD-10-CM | POA: Diagnosis not present

## 2015-11-25 DIAGNOSIS — M6281 Muscle weakness (generalized): Secondary | ICD-10-CM | POA: Diagnosis not present

## 2015-11-25 DIAGNOSIS — G2581 Restless legs syndrome: Secondary | ICD-10-CM | POA: Diagnosis not present

## 2015-11-25 DIAGNOSIS — M4802 Spinal stenosis, cervical region: Secondary | ICD-10-CM | POA: Diagnosis not present

## 2015-11-25 DIAGNOSIS — M4806 Spinal stenosis, lumbar region: Secondary | ICD-10-CM | POA: Diagnosis not present

## 2015-11-25 DIAGNOSIS — E785 Hyperlipidemia, unspecified: Secondary | ICD-10-CM | POA: Diagnosis not present

## 2015-11-27 DIAGNOSIS — M316 Other giant cell arteritis: Secondary | ICD-10-CM | POA: Diagnosis not present

## 2015-11-27 DIAGNOSIS — M6281 Muscle weakness (generalized): Secondary | ICD-10-CM | POA: Diagnosis not present

## 2015-11-27 DIAGNOSIS — M4806 Spinal stenosis, lumbar region: Secondary | ICD-10-CM | POA: Diagnosis not present

## 2015-11-27 DIAGNOSIS — E785 Hyperlipidemia, unspecified: Secondary | ICD-10-CM | POA: Diagnosis not present

## 2015-11-27 DIAGNOSIS — M4802 Spinal stenosis, cervical region: Secondary | ICD-10-CM | POA: Diagnosis not present

## 2015-11-27 DIAGNOSIS — G2581 Restless legs syndrome: Secondary | ICD-10-CM | POA: Diagnosis not present

## 2015-12-01 DIAGNOSIS — G2581 Restless legs syndrome: Secondary | ICD-10-CM | POA: Diagnosis not present

## 2015-12-01 DIAGNOSIS — M4806 Spinal stenosis, lumbar region: Secondary | ICD-10-CM | POA: Diagnosis not present

## 2015-12-01 DIAGNOSIS — M6281 Muscle weakness (generalized): Secondary | ICD-10-CM | POA: Diagnosis not present

## 2015-12-01 DIAGNOSIS — M4802 Spinal stenosis, cervical region: Secondary | ICD-10-CM | POA: Diagnosis not present

## 2015-12-01 DIAGNOSIS — M316 Other giant cell arteritis: Secondary | ICD-10-CM | POA: Diagnosis not present

## 2015-12-01 DIAGNOSIS — E785 Hyperlipidemia, unspecified: Secondary | ICD-10-CM | POA: Diagnosis not present

## 2015-12-04 DIAGNOSIS — M6281 Muscle weakness (generalized): Secondary | ICD-10-CM | POA: Diagnosis not present

## 2015-12-04 DIAGNOSIS — M4802 Spinal stenosis, cervical region: Secondary | ICD-10-CM | POA: Diagnosis not present

## 2015-12-04 DIAGNOSIS — M316 Other giant cell arteritis: Secondary | ICD-10-CM | POA: Diagnosis not present

## 2015-12-04 DIAGNOSIS — E785 Hyperlipidemia, unspecified: Secondary | ICD-10-CM | POA: Diagnosis not present

## 2015-12-04 DIAGNOSIS — M4806 Spinal stenosis, lumbar region: Secondary | ICD-10-CM | POA: Diagnosis not present

## 2015-12-04 DIAGNOSIS — G2581 Restless legs syndrome: Secondary | ICD-10-CM | POA: Diagnosis not present

## 2015-12-08 DIAGNOSIS — E785 Hyperlipidemia, unspecified: Secondary | ICD-10-CM | POA: Diagnosis not present

## 2015-12-08 DIAGNOSIS — G2581 Restless legs syndrome: Secondary | ICD-10-CM | POA: Diagnosis not present

## 2015-12-08 DIAGNOSIS — M4806 Spinal stenosis, lumbar region: Secondary | ICD-10-CM | POA: Diagnosis not present

## 2015-12-08 DIAGNOSIS — M4802 Spinal stenosis, cervical region: Secondary | ICD-10-CM | POA: Diagnosis not present

## 2015-12-08 DIAGNOSIS — M316 Other giant cell arteritis: Secondary | ICD-10-CM | POA: Diagnosis not present

## 2015-12-08 DIAGNOSIS — M6281 Muscle weakness (generalized): Secondary | ICD-10-CM | POA: Diagnosis not present

## 2015-12-12 DIAGNOSIS — G2581 Restless legs syndrome: Secondary | ICD-10-CM | POA: Diagnosis not present

## 2015-12-12 DIAGNOSIS — M316 Other giant cell arteritis: Secondary | ICD-10-CM | POA: Diagnosis not present

## 2015-12-12 DIAGNOSIS — M4806 Spinal stenosis, lumbar region: Secondary | ICD-10-CM | POA: Diagnosis not present

## 2015-12-12 DIAGNOSIS — M6281 Muscle weakness (generalized): Secondary | ICD-10-CM | POA: Diagnosis not present

## 2015-12-12 DIAGNOSIS — M4802 Spinal stenosis, cervical region: Secondary | ICD-10-CM | POA: Diagnosis not present

## 2015-12-12 DIAGNOSIS — E785 Hyperlipidemia, unspecified: Secondary | ICD-10-CM | POA: Diagnosis not present

## 2015-12-16 DIAGNOSIS — M4806 Spinal stenosis, lumbar region: Secondary | ICD-10-CM | POA: Diagnosis not present

## 2015-12-16 DIAGNOSIS — M316 Other giant cell arteritis: Secondary | ICD-10-CM | POA: Diagnosis not present

## 2015-12-16 DIAGNOSIS — G2581 Restless legs syndrome: Secondary | ICD-10-CM | POA: Diagnosis not present

## 2015-12-16 DIAGNOSIS — M4802 Spinal stenosis, cervical region: Secondary | ICD-10-CM | POA: Diagnosis not present

## 2015-12-16 DIAGNOSIS — E785 Hyperlipidemia, unspecified: Secondary | ICD-10-CM | POA: Diagnosis not present

## 2015-12-16 DIAGNOSIS — M6281 Muscle weakness (generalized): Secondary | ICD-10-CM | POA: Diagnosis not present

## 2015-12-18 DIAGNOSIS — M4802 Spinal stenosis, cervical region: Secondary | ICD-10-CM | POA: Diagnosis not present

## 2015-12-18 DIAGNOSIS — M4806 Spinal stenosis, lumbar region: Secondary | ICD-10-CM | POA: Diagnosis not present

## 2015-12-18 DIAGNOSIS — M6281 Muscle weakness (generalized): Secondary | ICD-10-CM | POA: Diagnosis not present

## 2015-12-18 DIAGNOSIS — G2581 Restless legs syndrome: Secondary | ICD-10-CM | POA: Diagnosis not present

## 2015-12-18 DIAGNOSIS — M316 Other giant cell arteritis: Secondary | ICD-10-CM | POA: Diagnosis not present

## 2015-12-18 DIAGNOSIS — E785 Hyperlipidemia, unspecified: Secondary | ICD-10-CM | POA: Diagnosis not present

## 2015-12-23 ENCOUNTER — Ambulatory Visit (INDEPENDENT_AMBULATORY_CARE_PROVIDER_SITE_OTHER): Payer: Medicare Other | Admitting: Family Medicine

## 2015-12-23 ENCOUNTER — Encounter: Payer: Self-pay | Admitting: Family Medicine

## 2015-12-23 VITALS — BP 112/72 | Temp 99.1°F | Wt 164.0 lb

## 2015-12-23 DIAGNOSIS — J189 Pneumonia, unspecified organism: Secondary | ICD-10-CM

## 2015-12-23 MED ORDER — CEFTRIAXONE SODIUM 1 G IJ SOLR
500.0000 mg | Freq: Once | INTRAMUSCULAR | Status: AC
  Administered 2015-12-23: 500 mg via INTRAMUSCULAR

## 2015-12-23 MED ORDER — AZITHROMYCIN 250 MG PO TABS: ORAL_TABLET | ORAL | Status: DC

## 2015-12-23 NOTE — Progress Notes (Signed)
   Subjective:    Patient ID: Gerald Hall, male    DOB: 12-05-1934, 81 y.o.   MRN: [id number]  Cough This is a new problem. Episode onset: 3 days ago. Associated symptoms comments: Back pain.   Vertigo for one week. Patient has had some body aches low-grade fever not feeling good symptoms over the past several days. Started on Friday and Saturday now with increased coughing and congestion and low-grade fever denies any high fevers denies wheezing or difficulty breathing does have a history of smoking   Review of Systems  Respiratory: Positive for cough.    see above no nausea vomiting diarrhea     Objective:   Physical Exam Eardrums normal throat is normal neck supple lungs clear this up for the left lower base there is some crackles and congestion not respiratory distress       Assessment & Plan:  Flulike illness probable mild case of the flu be on where Tamiflu can help Vertigo-like symptoms it persist may need further intervention Progressive cough congestion-concerning for developing of community-acquired pneumonia-Rocephin 500 mg-Z-Pak as directed if worse over the next 48 hours follow-up warning signs were discussed. Patient has follow-up in a few weeks to recheck lungs

## 2015-12-31 ENCOUNTER — Ambulatory Visit (INDEPENDENT_AMBULATORY_CARE_PROVIDER_SITE_OTHER): Payer: Medicare Other | Admitting: Family Medicine

## 2015-12-31 ENCOUNTER — Encounter: Payer: Self-pay | Admitting: Family Medicine

## 2015-12-31 VITALS — Ht 69.5 in | Wt 164.0 lb

## 2015-12-31 DIAGNOSIS — J189 Pneumonia, unspecified organism: Secondary | ICD-10-CM | POA: Diagnosis not present

## 2015-12-31 MED ORDER — AMOXICILLIN-POT CLAVULANATE 875-125 MG PO TABS: 1.0000 | ORAL_TABLET | Freq: Two times a day (BID) | ORAL | Status: DC

## 2015-12-31 NOTE — Progress Notes (Signed)
   Subjective:    Patient ID: Gerald Hall, male    DOB: 1935/02/28, 81 y.o.   MRN: [id number]  Cough This is a new problem. Associated symptoms comments: Congestion, wheezing.   Relates intermittent congestion and coughing over the past week somewhat better compared where he was when we last saw him.   Review of Systems  Respiratory: Positive for cough.    denies fever chills sweats states energy is starting to come back     Objective:   Physical Exam Lungs are clear heart regular patient still with cough not respiratory distress HEENT benign       Assessment & Plan:  Pneumonia-antibiotics prescribed warning signs discuss patient if not doing great over the next 7-10 days then will need x-rays and additional lab work

## 2016-01-05 ENCOUNTER — Other Ambulatory Visit: Payer: Self-pay | Admitting: Family Medicine

## 2016-01-08 DIAGNOSIS — M4316 Spondylolisthesis, lumbar region: Secondary | ICD-10-CM | POA: Diagnosis not present

## 2016-01-12 ENCOUNTER — Ambulatory Visit (INDEPENDENT_AMBULATORY_CARE_PROVIDER_SITE_OTHER): Payer: Medicare Other | Admitting: Family Medicine

## 2016-01-12 ENCOUNTER — Encounter: Payer: Self-pay | Admitting: Family Medicine

## 2016-01-12 VITALS — BP 122/82 | Ht 69.5 in | Wt 162.2 lb

## 2016-01-12 DIAGNOSIS — I10 Essential (primary) hypertension: Secondary | ICD-10-CM | POA: Diagnosis not present

## 2016-01-12 DIAGNOSIS — I951 Orthostatic hypotension: Secondary | ICD-10-CM | POA: Diagnosis not present

## 2016-01-12 MED ORDER — HYDROCHLOROTHIAZIDE 25 MG PO TABS: 25.0000 mg | ORAL_TABLET | Freq: Every day | ORAL | Status: DC

## 2016-01-12 NOTE — Progress Notes (Signed)
   Subjective:    Patient ID: Gerald Hall, male    DOB: 02-22-1935, 81 y.o.   MRN: [id number]  Hypertension This is a chronic problem. The current episode started more than 1 year ago. Risk factors for coronary artery disease include male gender. Treatments tried: maxide. There are no compliance problems.    Relates recently treated for flu and pneumonia. Starting to do better.   Review of Systems Patient relates dizziness and feeling ataxia when he stands denies nausea vomiting high fever chills    Objective:   Physical Exam Lungs clear heart regular pulse normal blood pressure sitting 120/78 standing 110/72       Assessment & Plan:  Pneumonia resolved Orthostasis Stop Dyazide HCTZ 25 mg with potassium Follow-up 3-4 months If ongoing troubles let us now

## 2016-01-16 DIAGNOSIS — M316 Other giant cell arteritis: Secondary | ICD-10-CM | POA: Diagnosis not present

## 2016-02-12 ENCOUNTER — Other Ambulatory Visit: Payer: Self-pay | Admitting: *Deleted

## 2016-02-12 NOTE — Progress Notes (Signed)
May refill 4 

## 2016-02-13 MED ORDER — TRAMADOL HCL 50 MG PO TABS: 50.0000 mg | ORAL_TABLET | Freq: Four times a day (QID) | ORAL | Status: DC | PRN

## 2016-02-19 DIAGNOSIS — M316 Other giant cell arteritis: Secondary | ICD-10-CM | POA: Diagnosis not present

## 2016-02-20 DIAGNOSIS — Q828 Other specified congenital malformations of skin: Secondary | ICD-10-CM | POA: Diagnosis not present

## 2016-03-01 ENCOUNTER — Other Ambulatory Visit: Payer: Self-pay

## 2016-03-01 ENCOUNTER — Other Ambulatory Visit: Payer: Self-pay | Admitting: Family Medicine

## 2016-04-28 ENCOUNTER — Ambulatory Visit: Payer: Medicare Other | Admitting: Family Medicine

## 2016-05-03 ENCOUNTER — Other Ambulatory Visit: Payer: Self-pay | Admitting: *Deleted

## 2016-05-03 MED ORDER — LORAZEPAM 1 MG PO TABS: 0.5000 mg | ORAL_TABLET | Freq: Every evening | ORAL | Status: DC | PRN

## 2016-05-03 NOTE — Progress Notes (Signed)
May have this and 3 refills 

## 2016-05-12 DIAGNOSIS — H26491 Other secondary cataract, right eye: Secondary | ICD-10-CM | POA: Diagnosis not present

## 2016-05-20 DIAGNOSIS — H26492 Other secondary cataract, left eye: Secondary | ICD-10-CM | POA: Diagnosis not present

## 2016-05-21 DIAGNOSIS — Q828 Other specified congenital malformations of skin: Secondary | ICD-10-CM | POA: Diagnosis not present

## 2016-07-01 ENCOUNTER — Other Ambulatory Visit: Payer: Self-pay | Admitting: Family Medicine

## 2016-07-20 ENCOUNTER — Ambulatory Visit (INDEPENDENT_AMBULATORY_CARE_PROVIDER_SITE_OTHER): Payer: Medicare Other | Admitting: Family Medicine

## 2016-07-20 ENCOUNTER — Encounter: Payer: Self-pay | Admitting: Family Medicine

## 2016-07-20 VITALS — BP 100/70 | Ht 69.5 in | Wt 167.4 lb

## 2016-07-20 DIAGNOSIS — G2581 Restless legs syndrome: Secondary | ICD-10-CM

## 2016-07-20 DIAGNOSIS — R2681 Unsteadiness on feet: Secondary | ICD-10-CM

## 2016-07-20 DIAGNOSIS — R5383 Other fatigue: Secondary | ICD-10-CM

## 2016-07-20 DIAGNOSIS — M4806 Spinal stenosis, lumbar region: Secondary | ICD-10-CM

## 2016-07-20 DIAGNOSIS — I10 Essential (primary) hypertension: Secondary | ICD-10-CM | POA: Diagnosis not present

## 2016-07-20 DIAGNOSIS — Z23 Encounter for immunization: Secondary | ICD-10-CM

## 2016-07-20 DIAGNOSIS — M48061 Spinal stenosis, lumbar region without neurogenic claudication: Secondary | ICD-10-CM

## 2016-07-20 DIAGNOSIS — G4700 Insomnia, unspecified: Secondary | ICD-10-CM | POA: Insufficient documentation

## 2016-07-20 MED ORDER — ROPINIROLE HCL 0.25 MG PO TABS: 0.2500 mg | ORAL_TABLET | Freq: Every day | ORAL | 4 refills | Status: DC

## 2016-07-20 MED ORDER — TRAZODONE HCL 50 MG PO TABS: 25.0000 mg | ORAL_TABLET | Freq: Every evening | ORAL | 3 refills | Status: DC | PRN

## 2016-07-20 MED ORDER — POTASSIUM CHLORIDE ER 10 MEQ PO TBCR: 10.0000 meq | EXTENDED_RELEASE_TABLET | Freq: Every day | ORAL | 5 refills | Status: DC

## 2016-07-20 MED ORDER — HYDROCHLOROTHIAZIDE 25 MG PO TABS: 25.0000 mg | ORAL_TABLET | Freq: Every day | ORAL | 1 refills | Status: DC

## 2016-07-20 MED ORDER — TAMSULOSIN HCL 0.4 MG PO CAPS: ORAL_CAPSULE | ORAL | 5 refills | Status: DC

## 2016-07-20 MED ORDER — PNEUMOCOCCAL VAC POLYVALENT 25 MCG/0.5ML IJ INJ: 0.5000 mL | INJECTION | INTRAMUSCULAR | Status: DC

## 2016-07-20 NOTE — Progress Notes (Signed)
   Subjective:    Patient ID: Gerald Hall, male    DOB: Feb 25, 1935, 81 y.o.   MRN: [id number]  Hypertension  This is a chronic problem. The current episode started more than 1 year ago. The problem has been gradually improving since onset. Pertinent negatives include no chest pain. There are no associated agents to hypertension. There are no known risk factors for coronary artery disease. Treatments tried: HCTZ. The current treatment provides moderate improvement. There are no compliance problems.    Patient has weakness, incontinence and issues with his legs jumping. He relates that his legs at times seemed to jump at times he has hard time getting them to relax at night. He wonders if he benefit from medication help this. He also has significant insomnia issues difficult time falling asleep staying asleep and wondered if there might be a medicine he could try for this.   Also needs handicap placard.   Review of Systems  Constitutional: Negative for activity change, appetite change and fatigue.  HENT: Negative for congestion.   Respiratory: Negative for cough.   Cardiovascular: Negative for chest pain.  Gastrointestinal: Negative for abdominal pain.  Endocrine: Negative for polydipsia and polyphagia.  Neurological: Negative for weakness.  Psychiatric/Behavioral: Negative for confusion.       Objective:   Physical Exam  Constitutional: He appears well-nourished. No distress.  Cardiovascular: Normal rate, regular rhythm and normal heart sounds.   No murmur heard. Pulmonary/Chest: Effort normal and breath sounds normal. No respiratory distress.  Musculoskeletal: He exhibits no edema.  Lymphadenopathy:    He has no cervical adenopathy.  Neurological: He is alert.  Psychiatric: His behavior is normal.  Vitals reviewed.         Assessment & Plan:  1. Essential hypertension Patient's blood pressure overall doing well he is to watch diet as best he can check metabolic 7 -  Basic metabolic panel - Hepatic function panel - CBC with Differential/Platelet  2. Other fatigue Fatigue tiredness probably related into declining health as well as grief he is going through since losing his wife. Patient denies being depressed. Recheck in 2-3 months check lab work - Medical laboratory scientific officer - Hepatic function panel - CBC with Differential/Platelet - Ambulatory referral to Physical Therapy  3. Unsteady gait I believe this patient would benefit from physical therapy. I believe part of his problem is due to spinal stenosis. - Ambulatory referral to Physical Therapy  4. Need for vaccination Today. - Flu Vaccine QUAD 36+ mos PF IM (Fluarix & Fluzone Quad PF) - Pneumococcal polysaccharide vaccine 23-valent greater than or equal to 2yo subcutaneous/IM  5. Spinal stenosis of lumbar region I do not believe this patient would benefit from surgery currently.  Restless legs we will try Requip. Patient will let us know how this is working out  Insomnia try low-dose trazodone

## 2016-07-21 ENCOUNTER — Encounter: Payer: Self-pay | Admitting: Family Medicine

## 2016-07-21 LAB — CBC WITH DIFFERENTIAL/PLATELET
BASOS ABS: 0 10*3/uL (ref 0.0–0.2)
Basos: 0 %
EOS (ABSOLUTE): 0.2 10*3/uL (ref 0.0–0.4)
Eos: 2 %
HEMOGLOBIN: 16.4 g/dL (ref 12.6–17.7)
Hematocrit: 47.2 % (ref 37.5–51.0)
Immature Grans (Abs): 0 10*3/uL (ref 0.0–0.1)
Immature Granulocytes: 0 %
LYMPHS ABS: 2.8 10*3/uL (ref 0.7–3.1)
LYMPHS: 28 %
MCH: 31.7 pg (ref 26.6–33.0)
MCHC: 34.7 g/dL (ref 31.5–35.7)
MCV: 91 fL (ref 79–97)
MONOCYTES: 13 %
Monocytes Absolute: 1.3 10*3/uL — ABNORMAL HIGH (ref 0.1–0.9)
NEUTROS PCT: 57 %
Neutrophils Absolute: 5.8 10*3/uL (ref 1.4–7.0)
Platelets: 248 10*3/uL (ref 150–379)
RBC: 5.18 x10E6/uL (ref 4.14–5.80)
RDW: 14.8 % (ref 12.3–15.4)
WBC: 10.1 10*3/uL (ref 3.4–10.8)

## 2016-07-21 LAB — BASIC METABOLIC PANEL
BUN / CREAT RATIO: 18 (ref 10–24)
BUN: 17 mg/dL (ref 8–27)
CHLORIDE: 97 mmol/L (ref 96–106)
CO2: 29 mmol/L (ref 18–29)
Calcium: 9.8 mg/dL (ref 8.6–10.2)
Creatinine, Ser: 0.97 mg/dL (ref 0.76–1.27)
GFR calc non Af Amer: 74 mL/min/{1.73_m2} (ref >59)
GFR, EST AFRICAN AMERICAN: 86 mL/min/{1.73_m2} (ref >59)
Glucose: 83 mg/dL (ref 65–99)
POTASSIUM: 3.5 mmol/L (ref 3.5–5.2)
SODIUM: 141 mmol/L (ref 134–144)

## 2016-07-21 LAB — HEPATIC FUNCTION PANEL
ALT: 9 IU/L (ref 0–44)
AST: 10 IU/L (ref 0–40)
Albumin: 4.2 g/dL (ref 3.5–4.8)
Alkaline Phosphatase: 96 IU/L (ref 39–117)
BILIRUBIN, DIRECT: 0.14 mg/dL (ref 0.00–0.40)
Bilirubin Total: 0.5 mg/dL (ref 0.0–1.2)
Total Protein: 6.9 g/dL (ref 6.0–8.5)

## 2016-07-23 ENCOUNTER — Encounter: Payer: Self-pay | Admitting: Family Medicine

## 2016-07-30 DIAGNOSIS — Q828 Other specified congenital malformations of skin: Secondary | ICD-10-CM | POA: Diagnosis not present

## 2016-08-16 ENCOUNTER — Ambulatory Visit (HOSPITAL_COMMUNITY): Payer: Medicare Other | Admitting: Physical Therapy

## 2016-08-17 ENCOUNTER — Encounter (HOSPITAL_COMMUNITY): Payer: Self-pay | Admitting: Physical Therapy

## 2016-08-17 ENCOUNTER — Ambulatory Visit (HOSPITAL_COMMUNITY): Payer: Medicare Other | Attending: Family Medicine | Admitting: Physical Therapy

## 2016-08-17 DIAGNOSIS — M6281 Muscle weakness (generalized): Secondary | ICD-10-CM | POA: Diagnosis not present

## 2016-08-17 DIAGNOSIS — R2681 Unsteadiness on feet: Secondary | ICD-10-CM | POA: Insufficient documentation

## 2016-08-17 DIAGNOSIS — R2689 Other abnormalities of gait and mobility: Secondary | ICD-10-CM | POA: Diagnosis not present

## 2016-08-17 NOTE — Therapy (Signed)
[facility] [address] [place], [place], [address] Phone: [phone]   Fax:  [phone]  Physical Therapy Evaluation  Patient Details  Name: Gerald Hall MRN: [id number] Date of Birth: 09-Jul-1935 Referring Provider: Sallee Lange, MD  Encounter Date: 08/17/2016      PT End of Session - 08/17/16 1450    Visit Number 1   Number of Visits 16   Date for PT Re-Evaluation 09/14/16   Authorization Type Medicare part A & B   Authorization Time Period 08/17/16 to 10/12/16   PT Start Time 1350   PT Stop Time 1432   PT Time Calculation (min) 42 min   Activity Tolerance Patient tolerated treatment well;No increased pain   Behavior During Therapy WFL for tasks assessed/performed      Past Medical History:  Diagnosis Date  . Blood in urine    Sees urology yearly  . Deafness in right ear age 36  . Hyperlipidemia     Past Surgical History:  Procedure Laterality Date  . BACK SURGERY  2010  . CATARACT EXTRACTION Bilateral 07/30/14, 08/13/2014  . CERVICAL SPINE SURGERY  09/01/15   C 4-5 , [place]  . COLONOSCOPY  12/19/2007   RMR: 1. External hemorrohids, otherwise normal rectum.2. Normal colon. 3. Normal terminal ileum.  . COLONOSCOPY N/A 12/13/2014   Procedure: COLONOSCOPY;  Surgeon: Daneil Dolin, MD;  [facility];  Service: Endoscopy;  Laterality: N/A;  11:15 Pt Request Time  . KNEE SURGERY Left    around 2000, arthroscopic    There were no vitals filed for this visit.       Subjective Assessment - 08/17/16 1354    Subjective Pt reports that the past year he has grown more weak and has noticed his balance has decreased following his neck surgery on 09/01/15. States he has been using a rollator for almost a year now. Denies bowel incontinence as well as N/T.   Pertinent History L4/L5 surgery, Cx surgery, HLD   Limitations Walking   How long can you sit comfortably? unlimited    How long can you stand  comfortably? 2 minutes, eases off instantly   How long can you walk comfortably? 2 minutes, eases off instantly    Patient Stated Goals improve strength and balance    Currently in Pain? No/denies  Pain in B quads/knees/shins when standing/walking    Pain Descriptors / Indicators Aching            [facility] PT Assessment - 08/17/16 0001      Assessment   Medical Diagnosis General weakness, unsteady gait    Referring Provider Sallee Lange, MD   Onset Date/Surgical Date --  approx 1 year ago   Next MD Visit December    Prior Therapy HHPT for 8-10 visits     Precautions   Precautions None     Balance Screen   Has the patient fallen in the past 6 months No   Has the patient had a decrease in activity level because of a fear of falling?  Yes   Is the patient reluctant to leave their home because of a fear of falling?  No     Home Social worker Private residence   Living Arrangements Alone   Additional Comments ramp      Prior Function   Level of Rowena Retired     Associate Professor   Overall Cognitive Status Within  Functional Limits for tasks assessed     Observation/Other Assessments   Focus on Therapeutic Outcomes (FOTO)  81% limited      Sensation   Light Touch Appears Intact     Posture/Postural Control   Posture/Postural Control Postural limitations   Postural Limitations Posterior pelvic tilt;Flexed trunk;Forward head;Rounded Shoulders     ROM / Strength   AROM / PROM / Strength Strength     Strength   Strength Assessment Site Hip;Knee;Ankle   Right/Left Hip Right;Left   Right Hip Flexion 3+/5   Right Hip Extension 4-/5   Right Hip ABduction 4-/5   Left Hip Flexion 3+/5   Left Hip Extension 4-/5   Left Hip ABduction 3+/5   Right/Left Knee Right;Left   Right Knee Flexion 4-/5   Right Knee Extension 5/5   Left Knee Flexion 4-/5   Left Knee Extension 5/5   Right/Left Ankle Right;Left   Right Ankle Dorsiflexion 5/5    Left Ankle Dorsiflexion 4+/5     Flexibility   Soft Tissue Assessment /Muscle Length yes   Hamstrings Rt: 35 deg, Lt: 40 deg      Transfers   Five time sit to stand comments  19.42, no UE     Ambulation/Gait   Ambulation Distance (Feet) 40 Feet   Assistive device Rollator   Gait Pattern Decreased step length - right;Decreased step length - left;Decreased stride length;Left flexed knee in stance;Right flexed knee in stance;Trunk flexed   Ambulation Surface Level     High Level Balance   High Level Balance Comments TUG: 15.6 sec, rollator                    [facility] Adult PT Treatment/Exercise - 08/17/16 0001      Exercises   Exercises Knee/Hip     Knee/Hip Exercises: Supine   Bridges 1 set;10 reps;Both   Other Supine Knee/Hip Exercises ab set hold x5 sec, x8 reps      Knee/Hip Exercises: Sidelying   Clams x10 reps each with green TB                PT Education - 08/17/16 1448    Education provided Yes   Education Details eval findings/POC; 3 primary aspects of balance and how they all work together; encouraged pt to stand uproght during ambulation since his rollator will not adjust futher; initiated HEP    Person(s) Educated Patient   Methods Explanation;Demonstration;Handout   Comprehension Verbalized understanding;Returned demonstration          PT Short Term Goals - 08/17/16 1458      PT SHORT TERM GOAL #1   Title Pt will demo consistency and independence with his HEP to improve strength and mobility.    Time 2   Period Weeks   Status New     PT SHORT TERM GOAL #2   Title Pt will demo improved hamstring length to lacking no more than 25 deg BLE, to improve sitting posture.   Time 4   Period Weeks   Status New     PT SHORT TERM GOAL #3   Title Pt will demo correct log roll technique without cues from therapist atleast 3 trials, to decrease strain on his back during transitions in/out of bed.    Time 3   Period Weeks   Status New            PT Long Term Goals - 08/17/16 1500      PT LONG TERM  GOAL #1   Title Pt will demo improved BLE strength to atleast 4+/5 MMT, to increase his safety with functional activity.    Time 8   Period Weeks   Status New     PT LONG TERM GOAL #2   Title Pt will perform 5x sit to stand without UE support in less than 12 sec, to improve his safety/independence getting in and out of chairs at home.    Time 8   Period Weeks   Status New     PT LONG TERM GOAL #3   Title Pt will perform TUG in less than 13 sec, with LRAD to indicate he is at a decreased risk of falling in the community.    Time 8   Period Weeks   Status New     PT LONG TERM GOAL #4   Title Pt will perform SLS on each LE up to 10 sec without LOB, 3/5 trials, to decrease his risk of falling during ambulation/stair negotiation.    Time 8   Period Weeks   Status New               Plan - 08-26-2016 1451    Clinical Impression Statement Pt is a pleasant 81yo M referred to OPPT with concerns of unsteadiness of gait and general weakness. He presents to the clinic using rollator with slow/steady gait and further testing reveals BLE weakness, impaired LE flexibility, static balance and decreased performance on functional testing such as 5x sit to stand and TUG. He has been working on his exercises provided by a HHPT several months ago, which has helped him maintain his strength and mobility to this point, however he would benefit from skilled PT to address his limitations and decrease his risk of falling. I reviewed eval findings and discussed POC with pt who is in agreement and he was able to return correct demonstration of exercises in his initial HEP.    Rehab Potential Good   PT Frequency 2x / week   PT Duration 8 weeks   PT Treatment/Interventions ADLs/Self Care Home Management;Aquatic Therapy;Moist Heat;Therapeutic exercise;Therapeutic activities;Functional mobility training;Stair training;Gait training;Balance  training;Neuromuscular re-education;Patient/family education;Orthotic Fit/Training;Manual techniques;Passive range of motion   PT Next Visit Plan progression of BLE strength and hip flexibility; ensure proper technique with ab set/actviation and educate log roll technique; balance activity for last half of session if possible    PT Home Exercise Plan supine ab set, bridge, clamshell with blue TB   Recommended Other Services none    Consulted and Agree with Plan of Care Patient      Patient will benefit from skilled therapeutic intervention in order to improve the following deficits and impairments:  Abnormal gait, Decreased activity tolerance, Decreased safety awareness, Decreased strength, Impaired flexibility, Postural dysfunction, Pain, Improper body mechanics, Decreased range of motion, Decreased balance, Decreased endurance, Decreased mobility, Difficulty walking  Visit Diagnosis: Unsteadiness on feet  Muscle weakness (generalized)  Other abnormalities of gait and mobility      G-Codes - August 26, 2016 1503    Functional Assessment Tool Used FOTO: 81% limited    Functional Limitation Mobility: Walking and moving around   Mobility: Walking and Moving Around Current Status [phone]) At least 60 percent but less than 80 percent impaired, limited or restricted   Mobility: Walking and Moving Around Goal Status [phone]) At least 40 percent but less than 60 percent impaired, limited or restricted       Problem List Patient Active Problem List   Diagnosis  Date Noted  . Insomnia 07/20/2016  . Restless legs 07/20/2016  . Spinal stenosis in cervical region 10/14/2015  . Senile purpura ([place]) 04/18/2015  . Osteopenia 01/28/2015  . Spinal stenosis of lumbar region 01/20/2015  . BPH (benign prostatic hyperplasia) 01/20/2015  . Diverticulosis of colon without hemorrhage   . Encounter for screening colonoscopy 11/21/2014  . HTN (hypertension), benign 04/17/2014  . Temporal arteritis ([place])  04/17/2014    3:05 PM,08/17/16 Elly Modena PT, DPT [facility] [address] [place], [place], [address] Phone: [phone]   Fax:  [phone]  Name: Gerald Hall MRN: [id number] Date of Birth: Mar 01, 1935

## 2016-08-19 ENCOUNTER — Ambulatory Visit (HOSPITAL_COMMUNITY): Payer: Medicare Other | Admitting: Physical Therapy

## 2016-08-19 DIAGNOSIS — R2681 Unsteadiness on feet: Secondary | ICD-10-CM

## 2016-08-19 DIAGNOSIS — R2689 Other abnormalities of gait and mobility: Secondary | ICD-10-CM

## 2016-08-19 DIAGNOSIS — M6281 Muscle weakness (generalized): Secondary | ICD-10-CM

## 2016-08-19 NOTE — Therapy (Signed)
[facility] [place], [place], [address] Phone: [phone]   Fax:  [phone]  Physical Therapy Treatment  Patient Details  Name: Gerald Hall MRN: [id number] Date of Birth: 07-27-1935 Referring Provider: Sallee Lange, MD  Encounter Date: 08/19/2016      PT End of Session - 08/19/16 1518    Visit Number 2   Number of Visits 16   Date for PT Re-Evaluation 09/14/16   Authorization Type Medicare part A & B   Authorization Time Period 08/17/16 to 10/12/16   Authorization - Visit Number 2   Authorization - Number of Visits 10   PT Start Time [id number]   PT Stop Time 1427   PT Time Calculation (min) 38 min   Activity Tolerance Patient tolerated treatment well;No increased pain   Behavior During Therapy WFL for tasks assessed/performed      Past Medical History:  Diagnosis Date  . Blood in urine    Sees urology yearly  . Deafness in right ear age 81  . Hyperlipidemia     Past Surgical History:  Procedure Laterality Date  . BACK SURGERY  2010  . CATARACT EXTRACTION Bilateral 07/30/14, 08/13/2014  . CERVICAL SPINE SURGERY  09/01/15   C 4-5 , [place]  . COLONOSCOPY  12/19/2007   RMR: 1. External hemorrohids, otherwise normal rectum.2. Normal colon. 3. Normal terminal ileum.  . COLONOSCOPY N/A 12/13/2014   Procedure: COLONOSCOPY;  Surgeon: Daneil Dolin, MD;  [facility];  Service: Endoscopy;  Laterality: N/A;  11:15 Pt Request Time  . KNEE SURGERY Left    around 2000, arthroscopic    There were no vitals filed for this visit.      Subjective Assessment - 08/19/16 1351    Subjective Patient reports he is donig well, no major changes since last session and he has been trying to get in the habit of doing HEP    Pertinent History L4/L5 surgery, Cx surgery, HLD   Currently in Pain? Yes   Pain Score 3    Pain Location Leg   Pain Orientation Right;Left   Pain Descriptors / Indicators Aching    Pain Type Chronic pain   Pain Radiating Towards running down front of legs into knees    Pain Onset More than a month ago   Pain Frequency Constant   Aggravating Factors  nothing    Pain Relieving Factors nothing    Effect of Pain on Daily Activities limits extended standing/walking                          [facility] Adult PT Treatment/Exercise - 08/19/16 0001      Knee/Hip Exercises: Standing   Heel Raises Both;1 set;20 reps   Heel Raises Limitations heel and toe    Forward Lunges Both;1 set;10 reps   Forward Lunges Limitations 4 inch box    Forward Step Up Both;1 set;10 reps   Forward Step Up Limitations 4 inch box      Knee/Hip Exercises: Supine   Short Arc Quad Sets Both;1 set;10 reps   Short Arc Quad Sets Limitations 2#    Bridges 1 set;10 reps;Both   Other Supine Knee/Hip Exercises core activation 1x10 with 5 second holds    Other Supine Knee/Hip Exercises supine hip ABD with red TB 1x10     Knee/Hip Exercises: Sidelying   Clams x15 reps each with green TB  Balance Exercises - 08/19/16 1509      Balance Exercises: Standing   Standing Eyes Closed Foam/compliant surface;4 reps;20 secs   Tandem Stance Eyes open;4 reps;10 secs   SLS Eyes open;Solid surface  one foot on foam roll            PT Education - 08/19/16 1517    Education provided Yes   Education Details reviewed initial eval/goals    Person(s) Educated Patient   Methods Explanation   Comprehension Verbalized understanding          PT Short Term Goals - 08/17/16 1458      PT SHORT TERM GOAL #1   Title Pt will demo consistency and independence with his HEP to improve strength and mobility.    Time 2   Period Weeks   Status New     PT SHORT TERM GOAL #2   Title Pt will demo improved hamstring length to lacking no more than 25 deg BLE, to improve sitting posture.   Time 4   Period Weeks   Status New     PT SHORT TERM GOAL #3   Title Pt will demo correct log  roll technique without cues from therapist atleast 3 trials, to decrease strain on his back during transitions in/out of bed.    Time 3   Period Weeks   Status New           PT Long Term Goals - 08/17/16 1500      PT LONG TERM GOAL #1   Title Pt will demo improved BLE strength to atleast 4+/5 MMT, to increase his safety with functional activity.    Time 8   Period Weeks   Status New     PT LONG TERM GOAL #2   Title Pt will perform 5x sit to stand without UE support in less than 12 sec, to improve his safety/independence getting in and out of chairs at home.    Time 8   Period Weeks   Status New     PT LONG TERM GOAL #3   Title Pt will perform TUG in less than 13 sec, with LRAD to indicate he is at a decreased risk of falling in the community.    Time 8   Period Weeks   Status New     PT LONG TERM GOAL #4   Title Pt will perform SLS on each LE up to 10 sec without LOB, 3/5 trials, to decrease his risk of falling during ambulation/stair negotiation.    Time 8   Period Weeks   Status New               Plan - 08/19/16 1518    Clinical Impression Statement Performed functional strengthening and balance exercise per evaluating DPT POC; patient continues to demonstrate significant functional weakness as well as severe balance deficits, showing difficulty with balance tasks inside parallel bars and requiring min guard for safety. Patient fatigued at end of session; reviewed initial eval and goals at end of session, quick disclosure completed.    Rehab Potential Good   PT Frequency 2x / week   PT Duration 8 weeks   PT Treatment/Interventions ADLs/Self Care Home Management;Aquatic Therapy;Moist Heat;Therapeutic exercise;Therapeutic activities;Functional mobility training;Stair training;Gait training;Balance training;Neuromuscular re-education;Patient/family education;Orthotic Fit/Training;Manual techniques;Passive range of motion   PT Next Visit Plan progression of BLE  strength and hip flexibility; ensure Hall technique with ab set/actviation and educate log roll technique; balance activity for last half of session if  possible    PT Home Exercise Plan supine ab set, bridge, clamshell with blue TB   Consulted and Agree with Plan of Care Patient      Patient will benefit from skilled therapeutic intervention in order to improve the following deficits and impairments:  Abnormal gait, Decreased activity tolerance, Decreased safety awareness, Decreased strength, Impaired flexibility, Postural dysfunction, Pain, Improper body mechanics, Decreased range of motion, Decreased balance, Decreased endurance, Decreased mobility, Difficulty walking  Visit Diagnosis: Unsteadiness on feet  Muscle weakness (generalized)  Other abnormalities of gait and mobility     Problem List Patient Active Problem List   Diagnosis Date Noted  . Insomnia 07/20/2016  . Restless legs 07/20/2016  . Spinal stenosis in cervical region 10/14/2015  . Senile purpura ([place]) 04/18/2015  . Osteopenia 01/28/2015  . Spinal stenosis of lumbar region 01/20/2015  . BPH (benign prostatic hyperplasia) 01/20/2015  . Diverticulosis of colon without hemorrhage   . Encounter for screening colonoscopy 11/21/2014  . HTN (hypertension), benign 04/17/2014  . Temporal arteritis ([place]) 04/17/2014    Deniece Ree PT, DPT [phone]  [facility] [address] [place], [place], [address] Phone: [phone]   Fax:  [phone]  Name: Gerald Hall MRN: [id number] Date of Birth: [birth year]

## 2016-08-24 ENCOUNTER — Ambulatory Visit (HOSPITAL_COMMUNITY): Payer: Medicare Other | Admitting: Physical Therapy

## 2016-08-24 DIAGNOSIS — R2689 Other abnormalities of gait and mobility: Secondary | ICD-10-CM | POA: Diagnosis not present

## 2016-08-24 DIAGNOSIS — M6281 Muscle weakness (generalized): Secondary | ICD-10-CM | POA: Diagnosis not present

## 2016-08-24 DIAGNOSIS — R2681 Unsteadiness on feet: Secondary | ICD-10-CM

## 2016-08-24 NOTE — Therapy (Signed)
[facility] [address] [place], [place], [address] Phone: [phone]   Fax:  [phone]  Physical Therapy Treatment  Patient Details  Name: Gerald Hall MRN: [id number] Date of Birth: 1935/07/12 Referring Provider: Sallee Lange, MD  Encounter Date: 08/24/2016      PT End of Session - 08/24/16 1358    Visit Number 3   Number of Visits 16   Date for PT Re-Evaluation 09/14/16   Authorization Type Medicare part A & B   Authorization Time Period 08/17/16 to 10/12/16   Authorization - Visit Number 3   Authorization - Number of Visits 10   PT Start Time [id number]   PT Stop Time 1350   PT Time Calculation (min) 45 min   Activity Tolerance Patient tolerated treatment well;No increased pain   Behavior During Therapy WFL for tasks assessed/performed      Past Medical History:  Diagnosis Date  . Blood in urine    Sees urology yearly  . Deafness in right ear age 50  . Hyperlipidemia     Past Surgical History:  Procedure Laterality Date  . BACK SURGERY  2010  . CATARACT EXTRACTION Bilateral 07/30/14, 08/13/2014  . CERVICAL SPINE SURGERY  09/01/15   C 4-5 , [place]  . COLONOSCOPY  12/19/2007   RMR: 1. External hemorrohids, otherwise normal rectum.2. Normal colon. 3. Normal terminal ileum.  . COLONOSCOPY N/A 12/13/2014   Procedure: COLONOSCOPY;  Surgeon: Daneil Dolin, MD;  [facility];  Service: Endoscopy;  Laterality: N/A;  11:15 Pt Request Time  . KNEE SURGERY Left    around 2000, arthroscopic    There were no vitals filed for this visit.      Subjective Assessment - 08/24/16 1310    Subjective Pt states he has general soreness, doing his HEP as much as he can.     Currently in Pain? No/denies                         [facility] Adult PT Treatment/Exercise - 08/24/16 0001      Knee/Hip Exercises: Standing   Forward Lunges Both;1 set;10 reps   Forward Lunges Limitations 4 inch box    Forward Step Up Both;1 set;10 reps   Forward Step Up Limitations 4 inch box    Rocker Board 2 minutes  A/P and Rt/Lt with B UE's     Knee/Hip Exercises: Supine   Short Arc Quad Sets Both;20 reps   Short Arc Quad Sets Limitations 2#   Bridges 2 sets;10 reps     Knee/Hip Exercises: Sidelying   Hip ABduction Both;2 sets;10 reps   Clams x15 reps each with green TB             Balance Exercises - 08/24/16 1333      Balance Exercises: Standing   Tandem Stance Eyes open;2 reps;15 secs   SLS Eyes open;Solid surface;Upper extremity support 1;2 reps;30 secs             PT Short Term Goals - 08/17/16 1458      PT SHORT TERM GOAL #1   Title Pt will demo consistency and independence with his HEP to improve strength and mobility.    Time 2   Period Weeks   Status New     PT SHORT TERM GOAL #2   Title Pt will demo improved hamstring length to lacking no more than 25 deg BLE, to  improve sitting posture.   Time 4   Period Weeks   Status New     PT SHORT TERM GOAL #3   Title Pt will demo correct log roll technique without cues from therapist atleast 3 trials, to decrease strain on his back during transitions in/out of bed.    Time 3   Period Weeks   Status New           PT Long Term Goals - 08/17/16 1500      PT LONG TERM GOAL #1   Title Pt will demo improved BLE strength to atleast 4+/5 MMT, to increase his safety with functional activity.    Time 8   Period Weeks   Status New     PT LONG TERM GOAL #2   Title Pt will perform 5x sit to stand without UE support in less than 12 sec, to improve his safety/independence getting in and out of chairs at home.    Time 8   Period Weeks   Status New     PT LONG TERM GOAL #3   Title Pt will perform TUG in less than 13 sec, with LRAD to indicate he is at a decreased risk of falling in the community.    Time 8   Period Weeks   Status New     PT LONG TERM GOAL #4   Title Pt will perform SLS on each LE up to 10 sec  without LOB, 3/5 trials, to decrease his risk of falling during ambulation/stair negotiation.    Time 8   Period Weeks   Status New               Plan - 08/24/16 1359    Clinical Impression Statement Completed all established therex with manual/verbal cues for form and isolation of correct musculature.  Pt was able to complete sidelying hip abduction with assist from therapist for form.  Added rockerboard this session with UE assist and completed SLS with 2 finger assist as extreme crepitus at attempts without UE's.  Continued with strengthening exercises and completed 2 sets of most therex this sesssion.  Pt required 3 seated rests today.   Rehab Potential Good   PT Frequency 2x / week   PT Duration 8 weeks   PT Treatment/Interventions ADLs/Self Care Home Management;Aquatic Therapy;Moist Heat;Therapeutic exercise;Therapeutic activities;Functional mobility training;Stair training;Gait training;Balance training;Neuromuscular re-education;Patient/family education;Orthotic Fit/Training;Manual techniques;Passive range of motion   PT Next Visit Plan progression of BLE strength and hip flexibility; ensure proper technique with ab set/actviation and educate log roll technique; balance activity for last half of session if possible    PT Home Exercise Plan supine ab set, bridge, clamshell with blue TB   Consulted and Agree with Plan of Care Patient      Patient will benefit from skilled therapeutic intervention in order to improve the following deficits and impairments:  Abnormal gait, Decreased activity tolerance, Decreased safety awareness, Decreased strength, Impaired flexibility, Postural dysfunction, Pain, Improper body mechanics, Decreased range of motion, Decreased balance, Decreased endurance, Decreased mobility, Difficulty walking  Visit Diagnosis: Unsteadiness on feet  Muscle weakness (generalized)  Other abnormalities of gait and mobility     Problem List Patient Active  Problem List   Diagnosis Date Noted  . Insomnia 07/20/2016  . Restless legs 07/20/2016  . Spinal stenosis in cervical region 10/14/2015  . Senile purpura ([place]) 04/18/2015  . Osteopenia 01/28/2015  . Spinal stenosis of lumbar region 01/20/2015  . BPH (benign prostatic hyperplasia) 01/20/2015  .  Diverticulosis of colon without hemorrhage   . Encounter for screening colonoscopy 11/21/2014  . HTN (hypertension), benign 04/17/2014  . Temporal arteritis ([place]) 04/17/2014    Teena Irani, PTA/CLT [phone]  08/24/2016, 2:05 PM  [facility] [address] [place], [place], [address] Phone: [phone]   Fax:  [phone]  Name: Gerald Hall MRN: [id number] Date of Birth: 02-06-1935

## 2016-08-26 ENCOUNTER — Ambulatory Visit (HOSPITAL_COMMUNITY): Payer: Medicare Other | Attending: Family Medicine | Admitting: Physical Therapy

## 2016-08-26 DIAGNOSIS — R2689 Other abnormalities of gait and mobility: Secondary | ICD-10-CM | POA: Insufficient documentation

## 2016-08-26 DIAGNOSIS — R2681 Unsteadiness on feet: Secondary | ICD-10-CM | POA: Insufficient documentation

## 2016-08-26 DIAGNOSIS — M6281 Muscle weakness (generalized): Secondary | ICD-10-CM

## 2016-08-26 NOTE — Therapy (Signed)
[name], [place], [address] Phone: [phone]   Fax:  [phone]  Physical Therapy Treatment  Patient Details  Name: Gerald Hall MRN: [id number] Date of Birth: [birth year] Referring Provider: Sallee Lange, MD  Encounter Date: 08/26/2016      PT End of Session - 08/26/16 1438    Visit Number 4   Number of Visits 16   Date for PT Re-Evaluation 09/14/16   Authorization Type Medicare part A & B   Authorization Time Period 08/17/16 to 10/12/16   Authorization - Visit Number 4   Authorization - Number of Visits 10   PT Start Time [id number]   PT Stop Time 1426   PT Time Calculation (min) 41 min   Activity Tolerance Patient tolerated treatment well;No increased pain   Behavior During Therapy WFL for tasks assessed/performed      Past Medical History:  Diagnosis Date  . Blood in urine    Sees urology yearly  . Deafness in right ear age 81  . Hyperlipidemia     Past Surgical History:  Procedure Laterality Date  . BACK SURGERY  2010  . CATARACT EXTRACTION Bilateral 07/30/14, 08/13/2014  . CERVICAL SPINE SURGERY  09/01/15   C 4-5 , [place]  . COLONOSCOPY  12/19/2007   RMR: 1. External hemorrohids, otherwise normal rectum.2. Normal colon. 3. Normal terminal ileum.  . COLONOSCOPY N/A 12/13/2014   Procedure: COLONOSCOPY;  Surgeon: Daneil Dolin, MD;  [facility];  Service: Endoscopy;  Laterality: N/A;  11:15 Pt Request Time  . KNEE SURGERY Left    around 2000, arthroscopic    There were no vitals filed for this visit.      Subjective Assessment - 08/26/16 1353    Subjective PT reports most discomfort is in his Rt thigh.  Reports compliance with HEP everyday that he doesn't come to therapy.   Currently in Pain? Yes   Pain Score 5    Pain Location Leg   Pain Orientation Right;Anterior;Upper   Pain Descriptors / Indicators Aching                         [facility]  Adult PT Treatment/Exercise - 08/26/16 0001      Bed Mobility   Bed Mobility Rolling Right;Rolling Left     Knee/Hip Exercises: Standing   Heel Raises Both;1 set;20 reps   Heel Raises Limitations heel and toe    Rocker Board 2 minutes   Other Standing Knee Exercises UE flexion against wall 10 reps     Knee/Hip Exercises: Seated   Long Arc Quad Both;10 reps   Knee/Hip Flexion 10     Knee/Hip Exercises: Supine   Short Arc Quad Sets Both;20 reps   Short Arc Quad Sets Limitations 3#   Bridges 15 reps     Knee/Hip Exercises: Sidelying   Hip ABduction Both;15 reps   Clams x15 reps each with green TB             Balance Exercises - 08/26/16 1436      Balance Exercises: Standing   Tandem Stance Eyes open;2 reps;15 secs   SLS Eyes open;Solid surface;Upper extremity support 1;2 reps;30 secs             PT Short Term Goals - 08/17/16 1458      PT SHORT TERM GOAL #1   Title Pt will demo consistency and independence with his  HEP to improve strength and mobility.    Time 2   Period Weeks   Status New     PT SHORT TERM GOAL #2   Title Pt will demo improved hamstring length to lacking no more than 25 deg BLE, to improve sitting posture.   Time 4   Period Weeks   Status New     PT SHORT TERM GOAL #3   Title Pt will demo correct log roll technique without cues from therapist atleast 3 trials, to decrease strain on his back during transitions in/out of bed.    Time 3   Period Weeks   Status New           PT Long Term Goals - 08/17/16 1500      PT LONG TERM GOAL #1   Title Pt will demo improved BLE strength to atleast 4+/5 MMT, to increase his safety with functional activity.    Time 8   Period Weeks   Status New     PT LONG TERM GOAL #2   Title Pt will perform 5x sit to stand without UE support in less than 12 sec, to improve his safety/independence getting in and out of chairs at home.    Time 8   Period Weeks   Status New     PT LONG TERM GOAL #3    Title Pt will perform TUG in less than 13 sec, with LRAD to indicate he is at a decreased risk of falling in the community.    Time 8   Period Weeks   Status New     PT LONG TERM GOAL #4   Title Pt will perform SLS on each LE up to 10 sec without LOB, 3/5 trials, to decrease his risk of falling during ambulation/stair negotiation.    Time 8   Period Weeks   Status New               Plan - 08/26/16 1439    Clinical Impression Statement continued with focus on improving core, postural and  LE strength.  Pt able to complete side lying exercises in improved form this session.  Pt oly required 2 seated rest breaks today with all the standing and functional strengthening tasks.  Added postural exericse with UE flexion against wall, seated LAQ and hip flexion as well.  Pt without any reported increases in pain at end of session.     Rehab Potential Good   PT Frequency 2x / week   PT Duration 8 weeks   PT Treatment/Interventions ADLs/Self Care Home Management;Aquatic Therapy;Moist Heat;Therapeutic exercise;Therapeutic activities;Functional mobility training;Stair training;Gait training;Balance training;Neuromuscular re-education;Patient/family education;Orthotic Fit/Training;Manual techniques;Passive range of motion   PT Next Visit Plan progression of BLE strength and hip flexibility. Progress balance actvities as well.      PT Home Exercise Plan at initial evaluation:  supine ab set, bridge, clamshell with blue TB   Consulted and Agree with Plan of Care Patient      Patient will benefit from skilled therapeutic intervention in order to improve the following deficits and impairments:  Abnormal gait, Decreased activity tolerance, Decreased safety awareness, Decreased strength, Impaired flexibility, Postural dysfunction, Pain, Improper body mechanics, Decreased range of motion, Decreased balance, Decreased endurance, Decreased mobility, Difficulty walking  Visit Diagnosis: Unsteadiness on  feet  Muscle weakness (generalized)  Other abnormalities of gait and mobility     Problem List Patient Active Problem List   Diagnosis Date Noted  . Insomnia 07/20/2016  . Restless  legs 07/20/2016  . Spinal stenosis in cervical region 10/14/2015  . Senile purpura ([place]) 04/18/2015  . Osteopenia 01/28/2015  . Spinal stenosis of lumbar region 01/20/2015  . BPH (benign prostatic hyperplasia) 01/20/2015  . Diverticulosis of colon without hemorrhage   . Encounter for screening colonoscopy 11/21/2014  . HTN (hypertension), benign 04/17/2014  . Temporal arteritis ([place]) 04/17/2014    Teena Irani, PTA/CLT [phone]  08/26/2016, 2:44 PM  [facility] [address] [place], [place], [address] Phone: [phone]   Fax:  [phone]  Name: Gerald Hall MRN: [id number] Date of Birth: 1935/08/09

## 2016-08-31 ENCOUNTER — Ambulatory Visit (HOSPITAL_COMMUNITY): Payer: Medicare Other

## 2016-08-31 DIAGNOSIS — R2689 Other abnormalities of gait and mobility: Secondary | ICD-10-CM | POA: Diagnosis not present

## 2016-08-31 DIAGNOSIS — R2681 Unsteadiness on feet: Secondary | ICD-10-CM

## 2016-08-31 DIAGNOSIS — M6281 Muscle weakness (generalized): Secondary | ICD-10-CM | POA: Diagnosis not present

## 2016-08-31 NOTE — Therapy (Signed)
[place], [place], [address] Phone: [phone]   Fax:  [phone]  Physical Therapy Treatment  Patient Details  Name: Gerald Hall MRN: [id number] Date of Birth: 09/13/1935 Referring Provider: Sallee Lange, MD  Encounter Date: 08/31/2016      PT End of Session - 08/31/16 1359    Visit Number 5   Number of Visits 16   Date for PT Re-Evaluation 09/14/16   Authorization Type Medicare part A & B   Authorization Time Period 08/17/16 to 10/12/16   Authorization - Visit Number 5   Authorization - Number of Visits 10   PT Start Time 1350   PT Stop Time 1432   PT Time Calculation (min) 42 min   Equipment Utilized During Treatment Gait belt   Activity Tolerance Patient tolerated treatment well;No increased pain   Behavior During Therapy WFL for tasks assessed/performed      Past Medical History:  Diagnosis Date  . Blood in urine    Sees urology yearly  . Deafness in right ear age 81  . Hyperlipidemia     Past Surgical History:  Procedure Laterality Date  . BACK SURGERY  2010  . CATARACT EXTRACTION Bilateral 07/30/14, 08/13/2014  . CERVICAL SPINE SURGERY  09/01/15   C 4-5 , [place]  . COLONOSCOPY  12/19/2007   RMR: 1. External hemorrohids, otherwise normal rectum.2. Normal colon. 3. Normal terminal ileum.  . COLONOSCOPY N/A 12/13/2014   Procedure: COLONOSCOPY;  Surgeon: Daneil Dolin, MD;  [facility];  Service: Endoscopy;  Laterality: N/A;  11:15 Pt Request Time  . KNEE SURGERY Left    around 2000, arthroscopic    There were no vitals filed for this visit.      Subjective Assessment - 08/31/16 1356    Subjective Pt stated Lt knee is sore today, current pain scale 3/10.   Pertinent History L4/L5 surgery, Cx surgery, HLD   Patient Stated Goals improve strength and balance    Currently in Pain? Yes   Pain Score 3    Pain Location Knee   Pain Orientation  Proximal;Anterior;Right   Pain Descriptors / Indicators Sore   Pain Type Chronic pain   Pain Radiating Towards runs down front of Rt thigh, pain does not go past knee   Pain Onset More than a month ago   Pain Frequency Constant  constant wtih standing; no pain with sitting or supine   Aggravating Factors  nothing   Pain Relieving Factors nothing   Effect of Pain on Daily Activities limited extended standing/walking           [facility] Adult PT Treatment/Exercise - 08/31/16 0001      Knee/Hip Exercises: Stretches   Active Hamstring Stretch Both;2 reps;30 seconds   Active Hamstring Stretch Limitations supine with rope     Knee/Hip Exercises: Standing   Heel Raises Both;1 set;20 reps   Heel Raises Limitations heel and toe    Rocker Board 2 minutes   Gait Training Cueing to reduce pressure UE with gait RW to improve posutre   Other Standing Knee Exercises UE flexion against wall 10 reps     Knee/Hip Exercises: Seated   Sit to Sand 10 reps;without UE support     Knee/Hip Exercises: Supine   Short Arc [employer] Both;20 reps   Short Arc Quad Sets Limitations 3#   Bridges 15 reps     Knee/Hip Exercises: Sidelying   Hip  ABduction Both;15 reps   Hip ABduction Limitations cueing for form             Balance Exercises - 08/31/16 1439      Balance Exercises: Standing   Tandem Stance Eyes open;3 reps;30 secs   SLS Eyes open;Solid surface;Upper extremity support 1;2 reps;30 secs   Rockerboard Lateral;UE support  2 minutes R/L             PT Short Term Goals - 08/17/16 1458      PT SHORT TERM GOAL #1   Title Pt will demo consistency and independence with his HEP to improve strength and mobility.    Time 2   Period Weeks   Status New     PT SHORT TERM GOAL #2   Title Pt will demo improved hamstring length to lacking no more than 25 deg BLE, to improve sitting posture.   Time 4   Period Weeks   Status New     PT SHORT TERM GOAL #3   Title Pt will demo correct  log roll technique without cues from therapist atleast 3 trials, to decrease strain on his back during transitions in/out of bed.    Time 3   Period Weeks   Status New           PT Long Term Goals - 08/17/16 1500      PT LONG TERM GOAL #1   Title Pt will demo improved BLE strength to atleast 4+/5 MMT, to increase his safety with functional activity.    Time 8   Period Weeks   Status New     PT LONG TERM GOAL #2   Title Pt will perform 5x sit to stand without UE support in less than 12 sec, to improve his safety/independence getting in and out of chairs at home.    Time 8   Period Weeks   Status New     PT LONG TERM GOAL #3   Title Pt will perform TUG in less than 13 sec, with LRAD to indicate he is at a decreased risk of falling in the community.    Time 8   Period Weeks   Status New     PT LONG TERM GOAL #4   Title Pt will perform SLS on each LE up to 10 sec without LOB, 3/5 trials, to decrease his risk of falling during ambulation/stair negotiation.    Time 8   Period Weeks   Status New               Plan - 08/31/16 1434    Clinical Impression Statement Session focus on improving proximal musculature, core and postural strengthening.  Pt is progressing well towards functional strenghtening with reports of easier sit to stand from lower height without HHA and less assistance required with balance activities.  Pt does continue to require cueing for posture awareness through session.  No reports of increased pain through session, was limited by fatigue.     Rehab Potential Good   PT Frequency 2x / week   PT Duration 8 weeks   PT Treatment/Interventions ADLs/Self Care Home Management;Aquatic Therapy;Moist Heat;Therapeutic exercise;Therapeutic activities;Functional mobility training;Stair training;Gait training;Balance training;Neuromuscular re-education;Patient/family education;Orthotic Fit/Training;Manual techniques;Passive range of motion   PT Next Visit Plan  progression of BLE strength and hip flexibility. Progress balance actvities as well.         Patient will benefit from skilled therapeutic intervention in order to improve the following deficits and impairments:  Abnormal  gait, Decreased activity tolerance, Decreased safety awareness, Decreased strength, Impaired flexibility, Postural dysfunction, Pain, Improper body mechanics, Decreased range of motion, Decreased balance, Decreased endurance, Decreased mobility, Difficulty walking  Visit Diagnosis: Unsteadiness on feet  Muscle weakness (generalized)  Other abnormalities of gait and mobility     Problem List Patient Active Problem List   Diagnosis Date Noted  . Insomnia 07/20/2016  . Restless legs 07/20/2016  . Spinal stenosis in cervical region 10/14/2015  . Senile purpura ([place]) 04/18/2015  . Osteopenia 01/28/2015  . Spinal stenosis of lumbar region 01/20/2015  . BPH (benign prostatic hyperplasia) 01/20/2015  . Diverticulosis of colon without hemorrhage   . Encounter for screening colonoscopy 11/21/2014  . HTN (hypertension), benign 04/17/2014  . Temporal arteritis [facility]) 04/17/2014   Ihor [place], [place]; [place]  Aldona Lento 08/31/2016, 2:41 PM  [facility] [address] [place], [place], [address] Phone: [phone]   Fax:  [phone]  Name: Gerald Hall MRN: [id number] Date of Birth: 01-01-1935

## 2016-09-02 ENCOUNTER — Ambulatory Visit (HOSPITAL_COMMUNITY): Payer: Medicare Other | Admitting: Physical Therapy

## 2016-09-02 DIAGNOSIS — M6281 Muscle weakness (generalized): Secondary | ICD-10-CM | POA: Diagnosis not present

## 2016-09-02 DIAGNOSIS — R2681 Unsteadiness on feet: Secondary | ICD-10-CM

## 2016-09-02 DIAGNOSIS — R2689 Other abnormalities of gait and mobility: Secondary | ICD-10-CM | POA: Diagnosis not present

## 2016-09-02 NOTE — Therapy (Signed)
[facility] [address] [place], [place], [address] Phone: [phone]   Fax:  [phone]  Physical Therapy Treatment  Patient Details  Name: Gerald Hall MRN: [id number] Date of Birth: 1935/08/11 Referring Provider: Sallee Lange, MD  Encounter Date: 09/02/2016      PT End of Session - 09/02/16 1350    Visit Number 6   Number of Visits 16   Date for PT Re-Evaluation 09/14/16   Authorization Type Medicare part A & B   Authorization Time Period 08/17/16 to 10/12/16   Authorization - Visit Number 6   Authorization - Number of Visits 10   PT Start Time [id number]   PT Stop Time 1345   PT Time Calculation (min) 41 min   Equipment Utilized During Treatment Gait belt   Activity Tolerance Patient tolerated treatment well;No increased pain   Behavior During Therapy WFL for tasks assessed/performed      Past Medical History:  Diagnosis Date  . Blood in urine    Sees urology yearly  . Deafness in right ear age 57  . Hyperlipidemia     Past Surgical History:  Procedure Laterality Date  . BACK SURGERY  2010  . CATARACT EXTRACTION Bilateral 07/30/14, 08/13/2014  . CERVICAL SPINE SURGERY  09/01/15   C 4-5 , [place]  . COLONOSCOPY  12/19/2007   RMR: 1. External hemorrohids, otherwise normal rectum.2. Normal colon. 3. Normal terminal ileum.  . COLONOSCOPY N/A 12/13/2014   Procedure: COLONOSCOPY;  Surgeon: Daneil Dolin, MD;  [facility];  Service: Endoscopy;  Laterality: N/A;  11:15 Pt Request Time  . KNEE SURGERY Left    around 2000, arthroscopic    There were no vitals filed for this visit.      Subjective Assessment - 09/02/16 1311    Subjective PT states his Lt knee is better today and is feeling everything on his Rt side, expecially his Rt hip that began bothering him in the night rated at 4/10.  No lumbar pain.   Multiple Pain Sites Yes   Pain Score 4   Pain Location Hip   Pain Orientation Right    Pain Descriptors / Indicators Aching                         [facility] Adult PT Treatment/Exercise - 09/02/16 0001      Knee/Hip Exercises: Stretches   Active Hamstring Stretch Both;2 reps;30 seconds   Active Hamstring Stretch Limitations supine with rope     Knee/Hip Exercises: Standing   Heel Raises Both;1 set;20 reps   Heel Raises Limitations heel and toe    Forward Lunges Both;1 set;10 reps   Forward Lunges Limitations 4 inch box    Gait Training Cueing to reduce pressure UE with gait RW to improve posutre   Other Standing Knee Exercises UE flexion against wall 10 reps     Knee/Hip Exercises: Seated   Sit to Sand 10 reps;without UE support     Knee/Hip Exercises: Supine   Short Arc Quad Sets Both;20 reps   Short Arc Quad Sets Limitations 5#   Bridges 20 reps     Knee/Hip Exercises: Sidelying   Hip ABduction Both;15 reps   Hip ABduction Limitations cueing for form   Clams x20 reps each with green TB                  PT Short Term Goals - 08/17/16  [place] #1   Title Pt will demo consistency and independence with his HEP to improve strength and mobility.    Time 2   Period Weeks   Status New     PT SHORT TERM GOAL #2   Title Pt will demo improved hamstring length to lacking no more than 25 deg BLE, to improve sitting posture.   Time 4   Period Weeks   Status New     PT SHORT TERM GOAL #3   Title Pt will demo correct log roll technique without cues from therapist atleast 3 trials, to decrease strain on his back during transitions in/out of bed.    Time 3   Period Weeks   Status New           PT Long Term Goals - 08/17/16 1500      PT LONG TERM GOAL #1   Title Pt will demo improved BLE strength to atleast 4+/5 MMT, to increase his safety with functional activity.    Time 8   Period Weeks   Status New     PT LONG TERM GOAL #2   Title Pt will perform 5x sit to stand without UE support in less than 12 sec, to  improve his safety/independence getting in and out of chairs at home.    Time 8   Period Weeks   Status New     PT LONG TERM GOAL #3   Title Pt will perform TUG in less than 13 sec, with LRAD to indicate he is at a decreased risk of falling in the community.    Time 8   Period Weeks   Status New     PT LONG TERM GOAL #4   Title Pt will perform SLS on each LE up to 10 sec without LOB, 3/5 trials, to decrease his risk of falling during ambulation/stair negotiation.    Time 8   Period Weeks   Status New               Plan - 09/02/16 1351    Clinical Impression Statement Overall improving mobility, posturing and strength.  Able to increase to 5# weights with SAQ and increased Static balance time today bilaterally.  Pt with alot of crepitus in knees so caution with weight bearing due to this.  Pt required cues to have wider BOS with ambulation as Rt heel tends to rub on Lt heel with ambulation.  No rest breaks required with standing exericses this session.   Rehab Potential Good   PT Frequency 2x / week   PT Duration 8 weeks   PT Treatment/Interventions ADLs/Self Care Home Management;Aquatic Therapy;Moist Heat;Therapeutic exercise;Therapeutic activities;Functional mobility training;Stair training;Gait training;Balance training;Neuromuscular re-education;Patient/family education;Orthotic Fit/Training;Manual techniques;Passive range of motion   PT Next Visit Plan progression of BLE strength and hip flexibility. Progress balance actvities as well.         Patient will benefit from skilled therapeutic intervention in order to improve the following deficits and impairments:  Abnormal gait, Decreased activity tolerance, Decreased safety awareness, Decreased strength, Impaired flexibility, Postural dysfunction, Pain, Improper body mechanics, Decreased range of motion, Decreased balance, Decreased endurance, Decreased mobility, Difficulty walking  Visit Diagnosis: Unsteadiness on  feet  Muscle weakness (generalized)  Other abnormalities of gait and mobility     Problem List Patient Active Problem List   Diagnosis Date Noted  . Insomnia 07/20/2016  . Restless legs 07/20/2016  . Spinal stenosis in cervical  region 10/14/2015  . Senile purpura ([place]) 04/18/2015  . Osteopenia 01/28/2015  . Spinal stenosis of lumbar region 01/20/2015  . BPH (benign prostatic hyperplasia) 01/20/2015  . Diverticulosis of colon without hemorrhage   . Encounter for screening colonoscopy 11/21/2014  . HTN (hypertension), benign 04/17/2014  . Temporal arteritis ([place]) 04/17/2014    Teena Irani, PTA/CLT [phone]  09/02/2016, 1:54 PM  [facility] [address] [place], [place], [address] Phone: [phone]   Fax:  [phone]  Name: Gerald Hall MRN: [id number] Date of Birth: 29-Oct-1934

## 2016-09-07 ENCOUNTER — Ambulatory Visit (HOSPITAL_COMMUNITY): Payer: Medicare Other

## 2016-09-07 DIAGNOSIS — R2689 Other abnormalities of gait and mobility: Secondary | ICD-10-CM | POA: Diagnosis not present

## 2016-09-07 DIAGNOSIS — R2681 Unsteadiness on feet: Secondary | ICD-10-CM

## 2016-09-07 DIAGNOSIS — M6281 Muscle weakness (generalized): Secondary | ICD-10-CM

## 2016-09-07 NOTE — Therapy (Signed)
[facility] [address] [place], [place], [address] Phone: [phone]   Fax:  [phone]  Physical Therapy Treatment  Patient Details  Name: Gerald Hall MRN: [id number] Date of Birth: 30-Apr-1935 Referring Provider: Sallee Lange, MD  Encounter Date: 09/07/2016      PT End of Session - 09/07/16 1400    Visit Number 7   Number of Visits 17   Date for PT Re-Evaluation 09/14/16   Authorization Type Medicare part A & B   Authorization Time Period 08/17/16 to 10/12/16   Authorization - Visit Number 7   Authorization - Number of Visits 10   PT Start Time [id number]   PT Stop Time 1430   PT Time Calculation (min) 42 min   Equipment Utilized During Treatment Gait belt   Activity Tolerance Patient tolerated treatment well;No increased pain   Behavior During Therapy WFL for tasks assessed/performed      Past Medical History:  Diagnosis Date  . Blood in urine    Sees urology yearly  . Deafness in right ear age 18  . Hyperlipidemia     Past Surgical History:  Procedure Laterality Date  . BACK SURGERY  2010  . CATARACT EXTRACTION Bilateral 07/30/14, 08/13/2014  . CERVICAL SPINE SURGERY  09/01/15   C 4-5 , [place]  . COLONOSCOPY  12/19/2007   RMR: 1. External hemorrohids, otherwise normal rectum.2. Normal colon. 3. Normal terminal ileum.  . COLONOSCOPY N/A 12/13/2014   Procedure: COLONOSCOPY;  Surgeon: Daneil Dolin, MD;  [facility];  Service: Endoscopy;  Laterality: N/A;  11:15 Pt Request Time  . KNEE SURGERY Left    around 2000, arthroscopic    There were no vitals filed for this visit.      Subjective Assessment - 09/07/16 1357    Subjective Pt stated pain Rt hip and thigh pain scale 3/10 today.  Reports of compliance daily with HEP and no reports of recent falls.     Pertinent History L4/L5 surgery, Cx surgery, HLD   Patient Stated Goals improve strength and balance    Currently in Pain? Yes   Pain Score 3    Pain Location Leg   Pain Orientation Right;Proximal;Anterior   Pain Descriptors / Indicators Sore   Pain Type Chronic pain   Pain Radiating Towards runs down anterior Rt thigh, ending at knee   Pain Onset More than a month ago   Pain Frequency Constant   Aggravating Factors  nothing   Pain Relieving Factors nothing   Effect of Pain on Daily Activities limited extended standing/walking                         [facility] Adult PT Treatment/Exercise - 09/07/16 0001      Knee/Hip Exercises: Standing   Heel Raises Both;1 set;20 reps   Heel Raises Limitations heel and toe    Other Standing Knee Exercises UE flexion against wall 10 reps     Knee/Hip Exercises: Seated   Sit to Sand 10 reps;without UE support     Knee/Hip Exercises: Supine   Short Arc Quad Sets Both;20 reps   Short Arc Quad Sets Limitations 5#   Bridges 20 reps     Knee/Hip Exercises: Sidelying   Hip ABduction Both;2 sets;10 reps   Hip ABduction Limitations infront of mirror for form        09/07/16 1725  Balance Exercises: Standing  Tandem Stance Eyes  open;3 reps;30 secs  SLS Eyes open;Solid surface;3 reps (Lt 23", Rt 9" max of 3)  Rockerboard Lateral;UE support                PT Short Term Goals - 08/17/16 1458      PT SHORT TERM GOAL #1   Title Pt will demo consistency and independence with his HEP to improve strength and mobility.    Time 2   Period Weeks   Status New     PT SHORT TERM GOAL #2   Title Pt will demo improved hamstring length to lacking no more than 25 deg BLE, to improve sitting posture.   Time 4   Period Weeks   Status New     PT SHORT TERM GOAL #3   Title Pt will demo correct log roll technique without cues from therapist atleast 3 trials, to decrease strain on his back during transitions in/out of bed.    Time 3   Period Weeks   Status New           PT Long Term Goals - 08/17/16 1500      PT LONG TERM GOAL #1   Title Pt will demo  improved BLE strength to atleast 4+/5 MMT, to increase his safety with functional activity.    Time 8   Period Weeks   Status New     PT LONG TERM GOAL #2   Title Pt will perform 5x sit to stand without UE support in less than 12 sec, to improve his safety/independence getting in and out of chairs at home.    Time 8   Period Weeks   Status New     PT LONG TERM GOAL #3   Title Pt will perform TUG in less than 13 sec, with LRAD to indicate he is at a decreased risk of falling in the community.    Time 8   Period Weeks   Status New     PT LONG TERM GOAL #4   Title Pt will perform SLS on each LE up to 10 sec without LOB, 3/5 trials, to decrease his risk of falling during ambulation/stair negotiation.    Time 8   Period Weeks   Status New               Plan - 09/07/16 1714    Clinical Impression Statement Session focus on strengthening and balance training.  Noted a lot of crepitus Bil knee so caution with weight bearing.  Rt knee did buckle during balance training with therapist facilitation for safety.  Overall pt is improving mobiltiy and balance with less A required with tandem stance.  Did complete soft tissue mobilization technqiues to resolve trigger point Rt rectus femoris with reports of relief following.   Rehab Potential Good   PT Frequency 2x / week   PT Duration 8 weeks   PT Treatment/Interventions ADLs/Self Care Home Management;Aquatic Therapy;Moist Heat;Therapeutic exercise;Therapeutic activities;Functional mobility training;Stair training;Gait training;Balance training;Neuromuscular re-education;Patient/family education;Orthotic Fit/Training;Manual techniques;Passive range of motion   PT Next Visit Plan Progress to BLE strengthening and hip flexibilty with more closed chain exercises (per knee pain) and increase balance activities next session.   Add HEP as pt is compliant with current HEP for maximal benefits next session.   PT Home Exercise Plan at initial  evaluation:  supine ab set, bridge, clamshell with blue TB      Patient will benefit from skilled therapeutic intervention in order to improve the following deficits and  impairments:  Abnormal gait, Decreased activity tolerance, Decreased safety awareness, Decreased strength, Impaired flexibility, Postural dysfunction, Pain, Improper body mechanics, Decreased range of motion, Decreased balance, Decreased endurance, Decreased mobility, Difficulty walking  Visit Diagnosis: Unsteadiness on feet  Muscle weakness (generalized)  Other abnormalities of gait and mobility     Problem List Patient Active Problem List   Diagnosis Date Noted  . Insomnia 07/20/2016  . Restless legs 07/20/2016  . Spinal stenosis in cervical region 10/14/2015  . Senile purpura ([place]) 04/18/2015  . Osteopenia 01/28/2015  . Spinal stenosis of lumbar region 01/20/2015  . BPH (benign prostatic hyperplasia) 01/20/2015  . Diverticulosis of colon without hemorrhage   . Encounter for screening colonoscopy 11/21/2014  . HTN (hypertension), benign 04/17/2014  . Temporal arteritis [facility]) 04/17/2014   Ihor Austin, [place]; [place]  Aldona Lento 09/07/2016, 5:26 PM  [facility] [address] [place], [place], [address] Phone: [phone]   Fax:  [phone]  Name: Gerald Hall MRN: [id number] Date of Birth: [birth year]

## 2016-09-09 ENCOUNTER — Ambulatory Visit (HOSPITAL_COMMUNITY): Payer: Medicare Other | Admitting: Physical Therapy

## 2016-09-09 DIAGNOSIS — M6281 Muscle weakness (generalized): Secondary | ICD-10-CM | POA: Diagnosis not present

## 2016-09-09 DIAGNOSIS — R2689 Other abnormalities of gait and mobility: Secondary | ICD-10-CM

## 2016-09-09 DIAGNOSIS — R2681 Unsteadiness on feet: Secondary | ICD-10-CM | POA: Diagnosis not present

## 2016-09-09 NOTE — Therapy (Signed)
[facility] [place], [place], [address] Phone: [phone]   Fax:  [phone]  Physical Therapy Treatment  Patient Details  Name: Gerald Hall MRN: [id number] Date of Birth: Mar 16, 1935 Referring Provider: Sallee Lange, MD  Encounter Date: 09/09/2016      PT End of Session - 09/09/16 1532    Visit Number 8   Number of Visits 17   Date for PT Re-Evaluation 09/14/16   Authorization Type Medicare part A & B   Authorization Time Period 08/17/16 to 10/12/16   Authorization - Visit Number 8   Authorization - Number of Visits 10   PT Start Time [id number]   PT Stop Time 1512   PT Time Calculation (min) 40 min   Equipment Utilized During Treatment Gait belt   Activity Tolerance Patient tolerated treatment well;No increased pain   Behavior During Therapy WFL for tasks assessed/performed      Past Medical History:  Diagnosis Date  . Blood in urine    Sees urology yearly  . Deafness in right ear age 81  . Hyperlipidemia     Past Surgical History:  Procedure Laterality Date  . BACK SURGERY  2010  . CATARACT EXTRACTION Bilateral 07/30/14, 08/13/2014  . CERVICAL SPINE SURGERY  09/01/15   C 4-5 , [place]  . COLONOSCOPY  12/19/2007   RMR: 1. External hemorrohids, otherwise normal rectum.2. Normal colon. 3. Normal terminal ileum.  . COLONOSCOPY N/A 12/13/2014   Procedure: COLONOSCOPY;  Surgeon: Daneil Dolin, MD;  [facility];  Service: Endoscopy;  Laterality: N/A;  11:15 Pt Request Time  . KNEE SURGERY Left    around 2000, arthroscopic    There were no vitals filed for this visit.      Subjective Assessment - 09/09/16 1434    Subjective Pt states that he is doing well. He rates his Rt thigh pain a 3/10 currently. He is performing his HEP regular.   Pertinent History L4/L5 surgery, Cx surgery, HLD   Patient Stated Goals improve strength and balance    Currently in Pain? Yes   Pain Score 3    Pain Onset More than a month ago                         [facility] Adult PT Treatment/Exercise - 09/09/16 0001      Knee/Hip Exercises: Stretches   Sports administrator 30 seconds;Right;3 reps   Sports administrator Limitations supine      Knee/Hip Exercises: Seated   Long Arc Quad Both;1 set;10 reps   Long Arc Quad Weight 10 lbs.     Knee/Hip Exercises: Sidelying   Clams x15 with blue TB, each side      Manual Therapy   Manual Therapy Soft tissue mobilization   Manual therapy comments separate rest of session   Soft tissue mobilization rolling Rt promixal/lateral quad             Balance Exercises - 09/09/16 1441      Balance Exercises: Standing   Tandem Stance Eyes open;Eyes closed;4 reps  x2 each    SLS Eyes open;2 reps;20 secs;Intermittent upper extremity support   Rockerboard Anterior/posterior;Lateral;EO;Intermittent UE support;Other (comment)  x2 min each direction   Sidestepping 2 reps           PT Education - 09/09/16 1516    Education provided Yes   Education Details discussed noted progress and improvements  Person(s) Educated Patient   Methods Explanation   Comprehension Verbalized understanding          PT Short Term Goals - 08/17/16 1458      PT SHORT TERM GOAL #1   Title Pt will demo consistency and independence with his HEP to improve strength and mobility.    Time 2   Period Weeks   Status New     PT SHORT TERM GOAL #2   Title Pt will demo improved hamstring length to lacking no more than 25 deg BLE, to improve sitting posture.   Time 4   Period Weeks   Status New     PT SHORT TERM GOAL #3   Title Pt will demo correct log roll technique without cues from therapist atleast 3 trials, to decrease strain on his back during transitions in/out of bed.    Time 3   Period Weeks   Status New           PT Long Term Goals - 08/17/16 1500      PT LONG TERM GOAL #1   Title Pt will demo improved BLE strength to atleast 4+/5 MMT, to  increase his safety with functional activity.    Time 8   Period Weeks   Status New     PT LONG TERM GOAL #2   Title Pt will perform 5x sit to stand without UE support in less than 12 sec, to improve his safety/independence getting in and out of chairs at home.    Time 8   Period Weeks   Status New     PT LONG TERM GOAL #3   Title Pt will perform TUG in less than 13 sec, with LRAD to indicate he is at a decreased risk of falling in the community.    Time 8   Period Weeks   Status New     PT LONG TERM GOAL #4   Title Pt will perform SLS on each LE up to 10 sec without LOB, 3/5 trials, to decrease his risk of falling during ambulation/stair negotiation.    Time 8   Period Weeks   Status New               Plan - 09/09/16 1532    Clinical Impression Statement Today's session continued with focus on LE strength and balance. Noting improvements in all areas from his initial evaluation and introduced Rt rectus femoris stretch in supine without increase in reported pain by the end of today's session. Will continue with current POC.   Rehab Potential Good   PT Frequency 2x / week   PT Duration 8 weeks   PT Treatment/Interventions ADLs/Self Care Home Management;Aquatic Therapy;Moist Heat;Therapeutic exercise;Therapeutic activities;Functional mobility training;Stair training;Gait training;Balance training;Neuromuscular re-education;Patient/family education;Orthotic Fit/Training;Manual techniques;Passive range of motion   PT Next Visit Plan Progress to BLE strengthening and hip flexibilty with more closed chain exercises (per knee pain) and increase balance activities next session.      PT Home Exercise Plan supine ab set, bridge, clamshell with blue TB, supine hip flexor stretch    Recommended Other Services none    Consulted and Agree with Plan of Care Patient      Patient will benefit from skilled therapeutic intervention in order to improve the following deficits and impairments:   Abnormal gait, Decreased activity tolerance, Decreased safety awareness, Decreased strength, Impaired flexibility, Postural dysfunction, Pain, Improper body mechanics, Decreased range of motion, Decreased balance, Decreased endurance, Decreased mobility, Difficulty walking  Visit Diagnosis: Unsteadiness on feet  Muscle weakness (generalized)  Other abnormalities of gait and mobility     Problem List Patient Active Problem List   Diagnosis Date Noted  . Insomnia 07/20/2016  . Restless legs 07/20/2016  . Spinal stenosis in cervical region 10/14/2015  . Senile purpura ([place]) 04/18/2015  . Osteopenia 01/28/2015  . Spinal stenosis of lumbar region 01/20/2015  . BPH (benign prostatic hyperplasia) 01/20/2015  . Diverticulosis of colon without hemorrhage   . Encounter for screening colonoscopy 11/21/2014  . HTN (hypertension), benign 04/17/2014  . Temporal arteritis ([place]) 04/17/2014   3:42 PM,09/09/16 Elly Modena PT, DPT [facility] [address] [place], [place], [address] Phone: [phone]   Fax:  [phone]  Name: Gerald Hall MRN: [id number] Date of Birth: 10-30-1934

## 2016-09-10 ENCOUNTER — Other Ambulatory Visit: Payer: Self-pay

## 2016-09-10 MED ORDER — LORAZEPAM 1 MG PO TABS: 0.5000 mg | ORAL_TABLET | Freq: Every evening | ORAL | 3 refills | Status: DC | PRN

## 2016-09-10 NOTE — Progress Notes (Signed)
This +3 refills 

## 2016-09-13 ENCOUNTER — Ambulatory Visit (HOSPITAL_COMMUNITY): Payer: Medicare Other | Admitting: Physical Therapy

## 2016-09-13 DIAGNOSIS — R2681 Unsteadiness on feet: Secondary | ICD-10-CM | POA: Diagnosis not present

## 2016-09-13 DIAGNOSIS — R2689 Other abnormalities of gait and mobility: Secondary | ICD-10-CM | POA: Diagnosis not present

## 2016-09-13 DIAGNOSIS — M6281 Muscle weakness (generalized): Secondary | ICD-10-CM | POA: Diagnosis not present

## 2016-09-13 NOTE — Therapy (Signed)
[place], [place], [address] Phone: [phone]   Fax:  [phone]  Physical Therapy Treatment/Reassessment  Patient Details  Name: Gerald Hall MRN: [id number] Date of Birth: May 06, 1935 Referring Provider: Sallee Lange, MD  Encounter Date: 09/13/2016      PT End of Session - 09/13/16 1659    Visit Number 9   Number of Visits 17   Date for PT Re-Evaluation 10/12/16   Authorization Type Medicare part A & B   Authorization Time Period 08/17/16 to 10/12/16   Authorization - Visit Number 9   Authorization - Number of Visits 10   PT Start Time [id number]   PT Stop Time 1429   PT Time Calculation (min) 43 min   Equipment Utilized During Treatment Gait belt   Activity Tolerance Patient tolerated treatment well;No increased pain   Behavior During Therapy WFL for tasks assessed/performed      Past Medical History:  Diagnosis Date  . Blood in urine    Sees urology yearly  . Deafness in right ear age 20  . Hyperlipidemia     Past Surgical History:  Procedure Laterality Date  . BACK SURGERY  2010  . CATARACT EXTRACTION Bilateral 07/30/14, 08/13/2014  . CERVICAL SPINE SURGERY  09/01/15   C 4-5 , [place]  . COLONOSCOPY  12/19/2007   RMR: 1. External hemorrohids, otherwise normal rectum.2. Normal colon. 3. Normal terminal ileum.  . COLONOSCOPY N/A 12/13/2014   Procedure: COLONOSCOPY;  Surgeon: Daneil Dolin, MD;  [facility];  Service: Endoscopy;  Laterality: N/A;  11:15 Pt Request Time  . KNEE SURGERY Left    around 2000, arthroscopic    There were no vitals filed for this visit.      Subjective Assessment - 09/13/16 1348    Subjective Pt states he continues to have Rt thigh soreness. He is doing his HEP at home. He feels that he has made some improvements overall, but still wants to get off his rollator.    Pertinent History L4/L5 surgery, Cx surgery, HLD   Patient Stated Goals  improve strength and balance    Currently in Pain? No/denies   Pain Onset --            [facility] PT Assessment - 09/13/16 0001      Assessment   Medical Diagnosis General weakness, unsteady gait    Referring Provider Sallee Lange, MD   Onset Date/Surgical Date --  approx 1 year ago   Next MD Visit had to reschedule   Prior Therapy HHPT for 8-10 visits     Precautions   Precautions None     Balance Screen   Has the patient fallen in the past 6 months No   Has the patient had a decrease in activity level because of a fear of falling?  Yes   Is the patient reluctant to leave their home because of a fear of falling?  No     Home Social worker Private residence   Living Arrangements Alone   Additional Comments ramp      Prior Function   Level of [place] Retired     Associate Professor   Overall Cognitive Status Within Functional Limits for tasks assessed     Observation/Other Assessments   Focus on Therapeutic Outcomes (FOTO)  49% limited      Sensation   Light Touch Appears Intact  Posture/Postural Control   Posture/Postural Control Postural limitations   Postural Limitations Posterior pelvic tilt;Flexed trunk;Forward head;Rounded Shoulders     Strength   Right Hip Flexion 5/5   Right Hip Extension 4/5   Right Hip ABduction 4/5   Left Hip Flexion 5/5   Left Hip Extension 4/5   Left Hip ABduction 4/5   Right Knee Flexion 4-/5   Right Knee Extension 5/5   Left Knee Flexion 4-/5   Left Knee Extension 5/5   Right Ankle Dorsiflexion 5/5   Left Ankle Dorsiflexion 5/5     Flexibility   Soft Tissue Assessment /Muscle Length yes   Hamstrings Rt: 30 deg, Lt: 40 deg      Transfers   Five time sit to stand comments  21 sec, No UE     Ambulation/Gait   Ambulation Distance (Feet) --   Assistive device --   Gait Pattern --   Gait Comments forward flexed posture, rollator out in front     Standardized Balance Assessment    Standardized Balance Assessment Berg Balance Test     Berg Balance Test   Sit to Stand Able to stand  independently using hands   Standing Unsupported Able to stand safely 2 minutes   Sitting with Back Unsupported but Feet Supported on Floor or Stool Able to sit safely and securely 2 minutes   Stand to Sit Controls descent by using hands   Transfers Able to transfer safely, definite need of hands   Standing Unsupported with Eyes Closed Able to stand 3 seconds  5 sec before LOB forward   Standing Ubsupported with Feet Together Able to place feet together independently and stand for 1 minute with supervision   From Standing, Reach Forward with Outstretched Arm Reaches forward but needs supervision   From Standing Position, Pick up Object from Floor Unable to try/needs assist to keep balance   From Standing Position, Turn to Look Behind Over each Shoulder Needs supervision when turning   Turn 360 Degrees Needs assistance while turning  intermittent use of UE on // bars    Standing Unsupported, Alternately Place Feet on Step/Stool Needs assistance to keep from falling or unable to try  grabbed for support after 1 step    Standing Unsupported, One Foot in [employer] balance while stepping or standing   Standing on One Leg Tries to lift leg/unable to hold 3 seconds but remains standing independently  Lt: 3 sec, Rt: 1-2    Total Score 25     High Level Balance   High Level Balance Comments TUG: 13.5 sec, rollator                           Balance Exercises - 09/13/16 1423      Balance Exercises: Standing   Standing Eyes Opened Narrow base of support (BOS);Other (comment)  trunk rotation Lt/Rt x10 reps on firm and foam surface    Rockerboard Anterior/posterior;Lateral;EO;Intermittent UE support;Other (comment)  x2 min each            PT Education - 09/13/16 1659    Education provided Yes   Education Details discussed results of balance tests and other  activities/measures performed during reassessment   Person(s) Educated Patient   Methods Explanation   Comprehension Verbalized understanding          PT Short Term Goals - 08/17/16 1458      PT SHORT TERM GOAL #1   Title  Pt will demo consistency and independence with his HEP to improve strength and mobility.    Time 2   Period Weeks   Status New     PT SHORT TERM GOAL #2   Title Pt will demo improved hamstring length to lacking no more than 25 deg BLE, to improve sitting posture.   Time 4   Period Weeks   Status New     PT SHORT TERM GOAL #3   Title Pt will demo correct log roll technique without cues from therapist atleast 3 trials, to decrease strain on his back during transitions in/out of bed.    Time 3   Period Weeks   Status New           PT Long Term Goals - 08/17/16 1500      PT LONG TERM GOAL #1   Title Pt will demo improved BLE strength to atleast 4+/5 MMT, to increase his safety with functional activity.    Time 8   Period Weeks   Status New     PT LONG TERM GOAL #2   Title Pt will perform 5x sit to stand without UE support in less than 12 sec, to improve his safety/independence getting in and out of chairs at home.    Time 8   Period Weeks   Status New     PT LONG TERM GOAL #3   Title Pt will perform TUG in less than 13 sec, with LRAD to indicate he is at a decreased risk of falling in the community.    Time 8   Period Weeks   Status New     PT LONG TERM GOAL #4   Title Pt will perform SLS on each LE up to 10 sec without LOB, 3/5 trials, to decrease his risk of falling during ambulation/stair negotiation.    Time 8   Period Weeks   Status New               Plan - 09/13/16 1700    Clinical Impression Statement Pt was reassessed this visit and the Berg balance test was performed in addition to other measures of functional strength and mobility. Pt has made some progress in strength and TUG performance, but he continues to demonstrate B  hip weakness evident with manual muscle testing, and his 5x sit to stand remained mostly unchanged from eval. He scored low on his Berg balance test (specifically during weight shifting activity and SLS), placing him at a high risk of falling and injuring himself at home or in the community. Will continue with current PT POC addressing BLE strength, flexibility and balance to improve his independence and safety with activity.   Rehab Potential Good   PT Frequency 2x / week   PT Duration 8 weeks   PT Treatment/Interventions ADLs/Self Care Home Management;Aquatic Therapy;Moist Heat;Therapeutic exercise;Therapeutic activities;Functional mobility training;Stair training;Gait training;Balance training;Neuromuscular re-education;Patient/family education;Orthotic Fit/Training;Manual techniques;Passive range of motion   PT Next Visit Plan Progress to BLE strengthening (hip extension/abuction) and hip flexibilty with more closed chain exercises (per knee pain) and increase balance activities next session.      PT Home Exercise Plan supine ab set, bridge, clamshell with blue TB, supine hip flexor stretch    Recommended Other Services none    Consulted and Agree with Plan of Care Patient      Patient will benefit from skilled therapeutic intervention in order to improve the following deficits and impairments:  Abnormal gait, Decreased activity tolerance, Decreased  safety awareness, Decreased strength, Impaired flexibility, Postural dysfunction, Pain, Improper body mechanics, Decreased range of motion, Decreased balance, Decreased endurance, Decreased mobility, Difficulty walking  Visit Diagnosis: Unsteadiness on feet  Muscle weakness (generalized)  Other abnormalities of gait and mobility       G-Codes - 2016/09/21 1704    Functional Assessment Tool Used Clinical judgement based on assessment of balance, strength and functional mobility   Functional Limitation Mobility: Walking and moving around    Mobility: Walking and Moving Around Current Status [id number]) At least 40 percent but less than 60 percent impaired, limited or restricted   Mobility: Walking and Moving Around Goal Status [phone]) At least 40 percent but less than 60 percent impaired, limited or restricted      Problem List Patient Active Problem List   Diagnosis Date Noted  . Insomnia 07/20/2016  . Restless legs 07/20/2016  . Spinal stenosis in cervical region 10/14/2015  . Senile purpura ([place]) 04/18/2015  . Osteopenia 01/28/2015  . Spinal stenosis of lumbar region 01/20/2015  . BPH (benign prostatic hyperplasia) 01/20/2015  . Diverticulosis of colon without hemorrhage   . Encounter for screening colonoscopy 11/21/2014  . HTN (hypertension), benign 04/17/2014  . Temporal arteritis ([place]) 04/17/2014    5:07 PM,21-Sep-2016 Elly Modena PT, DPT [facility] [place] [address] [place], [place], [address] Phone: [phone]   Fax:  [phone]  Name: Gerald Hall MRN: [id number] Date of Birth: 10/05/1935

## 2016-09-14 ENCOUNTER — Encounter (HOSPITAL_COMMUNITY): Payer: Medicare Other | Admitting: Physical Therapy

## 2016-09-14 DIAGNOSIS — Z872 Personal history of diseases of the skin and subcutaneous tissue: Secondary | ICD-10-CM | POA: Diagnosis not present

## 2016-09-14 DIAGNOSIS — L821 Other seborrheic keratosis: Secondary | ICD-10-CM | POA: Diagnosis not present

## 2016-09-14 DIAGNOSIS — Z1283 Encounter for screening for malignant neoplasm of skin: Secondary | ICD-10-CM | POA: Diagnosis not present

## 2016-09-14 DIAGNOSIS — Z09 Encounter for follow-up examination after completed treatment for conditions other than malignant neoplasm: Secondary | ICD-10-CM | POA: Diagnosis not present

## 2016-09-14 DIAGNOSIS — Z85828 Personal history of other malignant neoplasm of skin: Secondary | ICD-10-CM | POA: Diagnosis not present

## 2016-09-14 DIAGNOSIS — Z08 Encounter for follow-up examination after completed treatment for malignant neoplasm: Secondary | ICD-10-CM | POA: Diagnosis not present

## 2016-09-14 DIAGNOSIS — D235 Other benign neoplasm of skin of trunk: Secondary | ICD-10-CM | POA: Diagnosis not present

## 2016-09-14 DIAGNOSIS — L570 Actinic keratosis: Secondary | ICD-10-CM | POA: Diagnosis not present

## 2016-09-20 ENCOUNTER — Encounter (HOSPITAL_COMMUNITY): Payer: Medicare Other | Admitting: Physical Therapy

## 2016-09-22 ENCOUNTER — Ambulatory Visit (HOSPITAL_COMMUNITY): Payer: Medicare Other

## 2016-09-22 DIAGNOSIS — R2681 Unsteadiness on feet: Secondary | ICD-10-CM | POA: Diagnosis not present

## 2016-09-22 DIAGNOSIS — R2689 Other abnormalities of gait and mobility: Secondary | ICD-10-CM | POA: Diagnosis not present

## 2016-09-22 DIAGNOSIS — M6281 Muscle weakness (generalized): Secondary | ICD-10-CM | POA: Diagnosis not present

## 2016-09-22 NOTE — Therapy (Signed)
[place], [place], [address] Phone: [phone]   Fax:  [phone]  Physical Therapy Treatment  Patient Details  Name: Gerald Hall MRN: [id number] Date of Birth: 1935-02-18 Referring Provider: Sallee Lange, MD  Encounter Date: 09/22/2016      PT End of Session - 09/22/16 1310    Visit Number 10   Number of Visits 17   Date for PT Re-Evaluation 11/03/16   Authorization Type Medicare part A & B   Authorization Time Period 09/08/16 to 11-03-16 (G-codes done 9th visit)   Authorization - Visit Number 10   Authorization - Number of Visits 19   PT Start Time [id number]   PT Stop Time 1351   PT Time Calculation (min) 46 min   Equipment Utilized During Treatment Gait belt   Activity Tolerance Patient limited by fatigue;No increased pain;Patient tolerated treatment well   Behavior During Therapy [facility] for tasks assessed/performed      Past Medical History:  Diagnosis Date  . Blood in urine    Sees urology yearly  . Deafness in right ear age 64  . Hyperlipidemia     Past Surgical History:  Procedure Laterality Date  . BACK SURGERY  2010  . CATARACT EXTRACTION Bilateral 07/30/14, 08/13/2014  . CERVICAL SPINE SURGERY  09/01/15   C 4-5 , [place]  . COLONOSCOPY  12/19/2007   RMR: 1. External hemorrohids, otherwise normal rectum.2. Normal colon. 3. Normal terminal ileum.  . COLONOSCOPY N/A 12/13/2014   Procedure: COLONOSCOPY;  Surgeon: Daneil Dolin, MD;  [facility];  Service: Endoscopy;  Laterality: N/A;  11:15 Pt Request Time  . KNEE SURGERY Left    around 2000, arthroscopic    There were no vitals filed for this visit.      Subjective Assessment - 09/22/16 1308    Subjective Pt stated he has constant B LE thigh and knee soreness today.  Reports complaince wiht HEP daily.     Pertinent History L4/L5 surgery, Cx surgery, HLD   Patient Stated Goals improve strength and balance    Currently in Pain? Yes   Pain Score 3    Pain Location Leg   Pain Orientation Right;Proximal   Pain Descriptors / Indicators Aching   Pain Type Chronic pain   Pain Onset More than a month ago   Pain Frequency Constant   Aggravating Factors  weight bearing   Pain Relieving Factors sitting   Effect of Pain on Daily Activities limited weight bearing activiites.                           [place] Adult PT Treatment/Exercise - 09/22/16 0001      Knee/Hip Exercises: Aerobic   Nustep [place] L2 at end of session     Knee/Hip Exercises: Standing   Heel Raises Both;1 set;20 reps   Heel Raises Limitations heel and toe    Rocker Board 2 minutes  R/L and A/P with Bil HHA cueing for posture and to reduce HH     Knee/Hip Exercises: Sidelying   Hip ABduction 15 reps   Hip ABduction Limitations cueing for form             Balance Exercises - 09/22/16 1327      Balance Exercises: Standing   Standing Eyes Opened Narrow base of support (BOS);Other (comment)   Tandem Stance Eyes open;3 reps;30 secs   Rockerboard  Anterior/posterior;Lateral;EO;Intermittent UE support;Other (comment)  2 min each; cueing for posture and to reduce HHA   Sidestepping 2 reps;Theraband  RTB 2RT inside // bars no HHA   Step Over Hurdles / [place]es 2RT 6 and 12 in height inside //bars with intermittent HHA   Sit to Stand Time 10x wiht cueing for eccentric control             PT Short Term Goals - 08/17/16 1458      PT SHORT TERM GOAL #1   Title Pt will demo consistency and independence with his HEP to improve strength and mobility.    Time 2   Period Weeks   Status New     PT SHORT TERM GOAL #2   Title Pt will demo improved hamstring length to lacking no more than 25 deg BLE, to improve sitting posture.   Time 4   Period Weeks   Status New     PT SHORT TERM GOAL #3   Title Pt will demo correct log roll technique without cues from therapist atleast 3 trials, to decrease strain on his  back during transitions in/out of bed.    Time 3   Period Weeks   Status New           PT Long Term Goals - 08/17/16 1500      PT LONG TERM GOAL #1   Title Pt will demo improved BLE strength to atleast 4+/5 MMT, to increase his safety with functional activity.    Time 8   Period Weeks   Status New     PT LONG TERM GOAL #2   Title Pt will perform 5x sit to stand without UE support in less than 12 sec, to improve his safety/independence getting in and out of chairs at home.    Time 8   Period Weeks   Status New     PT LONG TERM GOAL #3   Title Pt will perform TUG in less than 13 sec, with LRAD to indicate he is at a decreased risk of falling in the community.    Time 8   Period Weeks   Status New     PT LONG TERM GOAL #4   Title Pt will perform SLS on each LE up to 10 sec without LOB, 3/5 trials, to decrease his risk of falling during ambulation/stair negotiation.    Time 8   Period Weeks   Status New               Plan - 09/22/16 1344    Clinical Impression Statement Primary session focus on improving balance training and functional strengthening.  Increased weight bearing this session wiht increased reports of fatigue requiring sitted rest breaks, no reports of increased LE pain through session.  Cueing required to improve posture through session that assisted with balance activities today.  Min A was required with all balance activities.  EOS with Nustep to improve activitiy tolerance, No reports of increased pain through session.     Rehab Potential Good   PT Frequency 2x / week   PT Duration 8 weeks   PT Next Visit Plan Progress to BLE strengthening (hip extension/abuction) and hip flexibilty with more closed chain exercises (per knee pain) and increase balance activities next session.         Patient will benefit from skilled therapeutic intervention in order to improve the following deficits and impairments:  Abnormal gait, Decreased activity tolerance,  Decreased safety awareness, Decreased strength,  Impaired flexibility, Postural dysfunction, Pain, Improper body mechanics, Decreased range of motion, Decreased balance, Decreased endurance, Decreased mobility, Difficulty walking  Visit Diagnosis: Unsteadiness on feet  Muscle weakness (generalized)  Other abnormalities of gait and mobility     Problem List Patient Active Problem List   Diagnosis Date Noted  . Insomnia 07/20/2016  . Restless legs 07/20/2016  . Spinal stenosis in cervical region 10/14/2015  . Senile purpura ([place]) 04/18/2015  . Osteopenia 01/28/2015  . Spinal stenosis of lumbar region 01/20/2015  . BPH (benign prostatic hyperplasia) 01/20/2015  . Diverticulosis of colon without hemorrhage   . Encounter for screening colonoscopy 11/21/2014  . HTN (hypertension), benign 04/17/2014  . Temporal arteritis [facility]) 04/17/2014   Ihor Austin, [place]; [place]  Aldona Lento 09/22/2016, 3:11 PM  [place] [address] [place], [place], [address] Phone: [phone]   Fax:  [phone]  Name: Gerald Hall MRN: [id number] Date of Birth: 1935/04/06

## 2016-09-28 ENCOUNTER — Encounter: Payer: Self-pay | Admitting: Family Medicine

## 2016-09-28 ENCOUNTER — Ambulatory Visit (HOSPITAL_COMMUNITY): Payer: Medicare Other | Attending: Family Medicine

## 2016-09-28 ENCOUNTER — Ambulatory Visit (INDEPENDENT_AMBULATORY_CARE_PROVIDER_SITE_OTHER): Payer: Medicare Other | Admitting: Family Medicine

## 2016-09-28 VITALS — BP 142/88 | Ht 70.0 in | Wt 173.0 lb

## 2016-09-28 DIAGNOSIS — R2681 Unsteadiness on feet: Secondary | ICD-10-CM | POA: Insufficient documentation

## 2016-09-28 DIAGNOSIS — G4700 Insomnia, unspecified: Secondary | ICD-10-CM

## 2016-09-28 DIAGNOSIS — I10 Essential (primary) hypertension: Secondary | ICD-10-CM | POA: Diagnosis not present

## 2016-09-28 DIAGNOSIS — G2581 Restless legs syndrome: Secondary | ICD-10-CM

## 2016-09-28 DIAGNOSIS — F4320 Adjustment disorder, unspecified: Secondary | ICD-10-CM | POA: Diagnosis not present

## 2016-09-28 DIAGNOSIS — H3530 Unspecified macular degeneration: Secondary | ICD-10-CM

## 2016-09-28 DIAGNOSIS — F4321 Adjustment disorder with depressed mood: Secondary | ICD-10-CM

## 2016-09-28 DIAGNOSIS — R2689 Other abnormalities of gait and mobility: Secondary | ICD-10-CM | POA: Insufficient documentation

## 2016-09-28 DIAGNOSIS — M6281 Muscle weakness (generalized): Secondary | ICD-10-CM | POA: Insufficient documentation

## 2016-09-28 MED ORDER — POTASSIUM CHLORIDE ER 10 MEQ PO TBCR: 10.0000 meq | EXTENDED_RELEASE_TABLET | Freq: Every day | ORAL | 3 refills | Status: DC

## 2016-09-28 MED ORDER — TAMSULOSIN HCL 0.4 MG PO CAPS: ORAL_CAPSULE | ORAL | 3 refills | Status: DC

## 2016-09-28 MED ORDER — ROPINIROLE HCL 1 MG PO TABS: 1.0000 mg | ORAL_TABLET | Freq: Every day | ORAL | 5 refills | Status: DC

## 2016-09-28 MED ORDER — TRAMADOL HCL 50 MG PO TABS: 50.0000 mg | ORAL_TABLET | Freq: Four times a day (QID) | ORAL | 4 refills | Status: DC | PRN

## 2016-09-28 MED ORDER — HYDROCHLOROTHIAZIDE 25 MG PO TABS: 25.0000 mg | ORAL_TABLET | Freq: Every day | ORAL | 3 refills | Status: DC

## 2016-09-28 NOTE — Therapy (Signed)
[place], [place], [address] Phone: [phone]   Fax:  [phone]  Physical Therapy Treatment  Patient Details  Name: Gerald Hall MRN: [id number] Date of Birth: Jul 23, 1935 Referring Provider: Sallee Lange, MD  Encounter Date: 09/28/2016      PT End of Session - 09/28/16 1308    Visit Number 11   Number of Visits 17   Date for PT Re-Evaluation Oct 27, 2016   Authorization Type Medicare part A & B   Authorization Time Period 01-Sep-2016 to 10-27-16 (G-codes done 9th visit)   Authorization - Visit Number 11   Authorization - Number of Visits 19   PT Start Time 1301   PT Stop Time 1348   PT Time Calculation (min) 47 min   Equipment Utilized During Treatment Gait belt   Activity Tolerance Patient tolerated treatment well;Patient limited by fatigue;Patient limited by pain   Behavior During Therapy [facility] for tasks assessed/performed      Past Medical History:  Diagnosis Date  . Blood in urine    Sees urology yearly  . Deafness in right ear age 81  . Hyperlipidemia     Past Surgical History:  Procedure Laterality Date  . BACK SURGERY  2010  . CATARACT EXTRACTION Bilateral 07/30/14, 08/13/2014  . CERVICAL SPINE SURGERY  09/01/15   C 4-5 , [place]  . COLONOSCOPY  12/19/2007   RMR: 1. External hemorrohids, otherwise normal rectum.2. Normal colon. 3. Normal terminal ileum.  . COLONOSCOPY N/A 12/13/2014   Procedure: COLONOSCOPY;  Surgeon: Daneil Dolin, MD;  [facility];  Service: Endoscopy;  Laterality: N/A;  11:15 Pt Request Time  . KNEE SURGERY Left    around 2000, arthroscopic    There were no vitals filed for this visit.      Subjective Assessment - 09/28/16 1259    Subjective Pt stated he has constant Rt thigh/Bil knee pain today.  No reoprts of recent falls   Pertinent History L4/L5 surgery, Cx surgery, HLD   Patient Stated Goals improve strength and balance    Currently in  Pain? Yes   Pain Score 3    Pain Location Leg   Pain Orientation Right;Proximal   Pain Descriptors / Indicators Aching   Pain Type Chronic pain   Pain Onset More than a month ago   Pain Frequency Constant   Aggravating Factors  weight bearing   Pain Relieving Factors sitting   Effect of Pain on Daily Activities limited weight bearing activities               [facility] Adult PT Treatment/Exercise - 09/28/16 0001      Knee/Hip Exercises: Aerobic   Nustep 5[place] L2 at end of session     Knee/Hip Exercises: Standing   Heel Raises Both;1 set;20 reps   Heel Raises Limitations heel and toe    Rocker Board 2 minutes   Rocker Board Limitations R/L and A/P with HHA   Gait Training 2'45" 170ft to assess activity tolerance with cueing to reduce UE pressure to improve posture, heel to toe gait and increase stride length for improved mechanics and to improve activity tolerance     Knee/Hip Exercises: Seated   Sit to Sand 10 reps;without UE support             Balance Exercises - 09/28/16 1318      Balance Exercises: Standing   Standing Eyes Opened Narrow base of support (BOS);Head  turns;Foam/compliant surface;3 reps;30 secs   Tandem Stance Eyes open;3 reps;30 secs  on airex; Lt knee buckled with min A for safety   Rockerboard Anterior/posterior;Lateral;EO;Intermittent UE support;Other (comment)   Sidestepping 2 reps;Theraband  RTB inside //bars, no HHA   Sit to Stand Time 10x wiht cueing for eccentric control, no HHA             PT Short Term Goals - 08/17/16 1458      PT SHORT TERM GOAL #1   Title Pt will demo consistency and independence with his HEP to improve strength and mobility.    Time 2   Period Weeks   Status New     PT SHORT TERM GOAL #2   Title Pt will demo improved hamstring length to lacking no more than 25 deg BLE, to improve sitting posture.   Time 4   Period Weeks   Status New     PT SHORT TERM GOAL #3   Title Pt will demo correct log roll  technique without cues from therapist atleast 3 trials, to decrease strain on his back during transitions in/out of bed.    Time 3   Period Weeks   Status New           PT Long Term Goals - 08/17/16 1500      PT LONG TERM GOAL #1   Title Pt will demo improved BLE strength to atleast 4+/5 MMT, to increase his safety with functional activity.    Time 8   Period Weeks   Status New     PT LONG TERM GOAL #2   Title Pt will perform 5x sit to stand without UE support in less than 12 sec, to improve his safety/independence getting in and out of chairs at home.    Time 8   Period Weeks   Status New     PT LONG TERM GOAL #3   Title Pt will perform TUG in less than 13 sec, with LRAD to indicate he is at a decreased risk of falling in the community.    Time 8   Period Weeks   Status New     PT LONG TERM GOAL #4   Title Pt will perform SLS on each LE up to 10 sec without LOB, 3/5 trials, to decrease his risk of falling during ambulation/stair negotiation.    Time 8   Period Weeks   Status New               Plan - 09/28/16 1343    Clinical Impression Statement Continued primary session focus on improving balance training and functional strengthening to improve activity tolerance.  Pt presents with improved posture through session with increased ease with balance activities, still min A for safety.  Gait training complete following balance training with cueing to reduce UE pressure to improve posture, heel to toe pattern and to increase stride length for improved gait mechanics and activity tolerance; pt able to ambualte 2\' 45"  prior fatigue.  EOS with Nustep for activity tolerance.  No reports of increased pain through session with increased weighte bearing, was limited by fatigue.     Rehab Potential Good   PT Frequency 2x / week   PT Duration 8 weeks   PT Treatment/Interventions ADLs/Self Care Home Management;Aquatic Therapy;Moist Heat;Therapeutic exercise;Therapeutic  activities;Functional mobility training;Stair training;Gait training;Balance training;Neuromuscular re-education;Patient/family education;Orthotic Fit/Training;Manual techniques;Passive range of motion   PT Next Visit Plan Progress to BLE strengthening (hip extension/abuction) and hip flexibilty with more  closed chain exercises (per knee pain) and increase balance activities next session.         Patient will benefit from skilled therapeutic intervention in order to improve the following deficits and impairments:  Abnormal gait, Decreased activity tolerance, Decreased safety awareness, Decreased strength, Impaired flexibility, Postural dysfunction, Pain, Improper body mechanics, Decreased range of motion, Decreased balance, Decreased endurance, Decreased mobility, Difficulty walking  Visit Diagnosis: Unsteadiness on feet  Muscle weakness (generalized)  Other abnormalities of gait and mobility     Problem List Patient Active Problem List   Diagnosis Date Noted  . Insomnia 07/20/2016  . Restless legs 07/20/2016  . Spinal stenosis in cervical region 10/14/2015  . Senile purpura ([place]) 04/18/2015  . Osteopenia 01/28/2015  . Spinal stenosis of lumbar region 01/20/2015  . BPH (benign prostatic hyperplasia) 01/20/2015  . Diverticulosis of colon without hemorrhage   . Encounter for screening colonoscopy 11/21/2014  . HTN (hypertension), benign 04/17/2014  . Temporal arteritis ([place]) 04/17/2014   Ihor Austin, [place]; [place]  Aldona Lento 09/28/2016, 2:09 PM  [place] [address] [place], [place], [address] Phone: [phone]   Fax:  [phone]  Name: Gerald Hall MRN: [id number] Date of Birth: April 12, 1935

## 2016-09-28 NOTE — Progress Notes (Signed)
   Subjective:    Patient ID: Gerald Hall, male    DOB: [birth year], 81 y.o.   MRN: [id number]  Hypertension  This is a chronic problem. Pertinent negatives include no chest pain.   takes his blood pressure medicine on a regla basis it does seem to help Has macular degeneration patient frustrated because he's losing vision. Would like to get a econd opinion withhis eyes  requip helping some with restless legs but not a lot.  mdicine helps some ut he is interested in seeing a higher dose  Trazodone not helping with sleep.  Medicine really does not help does not want to keep taking it Denies any reoccurrence of temporal arteritis  25 minutes was spent with the patient. Greater than half the time was spent in discussion and answering questions and counseling regarding the issues that the patient came in for today.  Review of Systems  Constitutional: Negative for activity change, appetite change and fatigue.  HENT: Negative for congestion.   Respiratory: Negative for cough.   Cardiovascular: Negative for chest pain.  Gastrointestinal: Negative for abdominal pain.  Endocrine: Negative for polydipsia and polyphagia.  Neurological: Negative for weakness.  Psychiatric/Behavioral: Negative for confusion.       Objective:   Physical Exam  Constitutional: He appears well-nourished. No distress.  Cardiovascular: Normal rate, regular rhythm and normal heart sounds.   No murmur heard. Pulmonary/Chest: Effort normal and breath sounds normal. No respiratory distress.  Musculoskeletal: He exhibits no edema.  Lymphadenopathy:    He has no cervical adenopathy.  Neurological: He is alert.  Psychiatric: His behavior is normal.  Vitals reviewed.         Assessment & Plan:   HTN ood control continue current measures Griefnot depressed but still mourning his wife Insomnia trazodone not helping stop thisinsomnia accommodation of grief as well as restless lgs adjust mediations  follow-up  patient in approximate 3-4 months 25 minutes was spent with the patient. Greater than half the time was spent in discussion and answering questions and counseling regarding the issues that the patient came in for today. Macular degeneration will look into referral to ophthalmology

## 2016-09-29 ENCOUNTER — Encounter (HOSPITAL_COMMUNITY): Payer: Medicare Other | Admitting: Physical Therapy

## 2016-09-29 ENCOUNTER — Other Ambulatory Visit: Payer: Self-pay | Admitting: Family Medicine

## 2016-09-29 NOTE — Addendum Note (Signed)
Addended by: Launa Grill on: 09/29/2016 08:29 AM   Modules accepted: Orders

## 2016-09-29 NOTE — Progress Notes (Signed)
Referral added in epic

## 2016-09-30 ENCOUNTER — Ambulatory Visit (HOSPITAL_COMMUNITY): Payer: Medicare Other | Admitting: Physical Therapy

## 2016-09-30 DIAGNOSIS — R2681 Unsteadiness on feet: Secondary | ICD-10-CM

## 2016-09-30 DIAGNOSIS — R2689 Other abnormalities of gait and mobility: Secondary | ICD-10-CM

## 2016-09-30 DIAGNOSIS — M6281 Muscle weakness (generalized): Secondary | ICD-10-CM | POA: Diagnosis not present

## 2016-09-30 NOTE — Therapy (Signed)
[facility] [address] [place], [place], [address] Phone: [phone]   Fax:  [phone]  Physical Therapy Treatment  Patient Details  Name: Gerald Hall MRN: [id number] Date of Birth: Jul 20, 1935 Referring Provider: Sallee Lange, MD  Encounter Date: 09/30/2016      PT End of Session - 09/30/16 1344    Visit Number 12   Number of Visits 17   Date for PT Re-Evaluation Oct 25, 2016   Authorization Type Medicare part A & B   Authorization Time Period 08/30/2016 to 10-25-16 (G-codes done 9th visit)   Authorization - Visit Number 12   Authorization - Number of Visits 19   PT Start Time 1300  last 5 min uncharged due to pt request to get on nustep    PT Stop Time 1345   PT Time Calculation (min) 45 min   Equipment Utilized During Treatment Gait belt   Activity Tolerance Patient tolerated treatment well;Patient limited by fatigue;Patient limited by pain   Behavior During Therapy Gerald Hall for tasks assessed/performed      Past Medical History:  Diagnosis Date  . Blood in urine    Sees urology yearly  . Deafness in right ear age 53  . Hyperlipidemia     Past Surgical History:  Procedure Laterality Date  . BACK SURGERY  2010  . CATARACT EXTRACTION Bilateral 07/30/14, 08/13/2014  . CERVICAL SPINE SURGERY  09/01/15   C 4-5 , [place]  . COLONOSCOPY  12/19/2007   RMR: 1. External hemorrohids, otherwise normal rectum.2. Normal colon. 3. Normal terminal ileum.  . COLONOSCOPY N/A 12/13/2014   Procedure: COLONOSCOPY;  Surgeon: Gerald Dolin, MD;  [facility];  Service: Endoscopy;  Laterality: N/A;  11:15 Pt Request Time  . KNEE SURGERY Left    around 2000, arthroscopic    There were no vitals filed for this visit.      Subjective Assessment - 09/30/16 1304    Subjective Pt reports that things are going well. He feels he has made progress since he started PT. He has been performing his HEP.    Pertinent History  L4/L5 surgery, Cx surgery, HLD   Patient Stated Goals improve strength and balance    Currently in Pain? Other (Comment)  not too bad today   Pain Onset More than a month ago                         [facility] Adult PT Treatment/Exercise - 09/30/16 0001      Knee/Hip Exercises: Stretches   Active Hamstring Stretch Both;30 seconds;2 reps   Active Hamstring Stretch Limitations 12" box    Gastroc Stretch 3 reps;30 seconds;Both   Gastroc Stretch Limitations slantboard      Knee/Hip Exercises: Standing   Heel Raises Both;1 set;20 reps     Knee/Hip Exercises: Seated   Other Seated Knee/Hip Exercises seated trunk rotation x10 reps Lt/Rt with blue TB    Sit to Sand 2 sets;10 reps;without UE support             Balance Exercises - 09/30/16 1325      Balance Exercises: Standing   Standing Eyes Opened Narrow base of support (BOS);Foam/compliant surface;Other (comment);2 reps;30 secs  trunk rotation with blue weighted ball x10 reps    Tandem Stance Eyes open;2 reps;30 secs  8-10 sec max    Sidestepping 3 reps;Other (comment)  in // bars    Other Standing Exercises  hip flexion with knee bent, slowed lowering to encourage SLS on contralateral LE, 2x10 each            PT Education - 09/30/16 1344    Education provided Yes   Education Details discussed upcoming re-evaluation to determine extension of POC or discharge from PT   Person(s) Educated Patient   Comprehension Verbalized understanding          PT Short Term Goals - 08/17/16 1458      PT SHORT TERM GOAL #1   Title Pt will demo consistency and independence with his HEP to improve strength and mobility.    Time 2   Period Weeks   Status New     PT SHORT TERM GOAL #2   Title Pt will demo improved hamstring length to lacking no more than 25 deg BLE, to improve sitting posture.   Time 4   Period Weeks   Status New     PT SHORT TERM GOAL #3   Title Pt will demo correct log roll technique without  cues from therapist atleast 3 trials, to decrease strain on his back during transitions in/out of bed.    Time 3   Period Weeks   Status New           PT Long Term Goals - 08/17/16 1500      PT LONG TERM GOAL #1   Title Pt will demo improved BLE strength to atleast 4+/5 MMT, to increase his safety with functional activity.    Time 8   Period Weeks   Status New     PT LONG TERM GOAL #2   Title Pt will perform 5x sit to stand without UE support in less than 12 sec, to improve his safety/independence getting in and out of chairs at home.    Time 8   Period Weeks   Status New     PT LONG TERM GOAL #3   Title Pt will perform TUG in less than 13 sec, with LRAD to indicate he is at a decreased risk of falling in the community.    Time 8   Period Weeks   Status New     PT LONG TERM GOAL #4   Title Pt will perform SLS on each LE up to 10 sec without LOB, 3/5 trials, to decrease his risk of falling during ambulation/stair negotiation.    Time 8   Period Weeks   Status New               Plan - 09/30/16 1345    Clinical Impression Statement [name] continues to make progress towards goals with improved activity tolerance and ability to perform increased resistance and reps without report of pain or significant fatigue. Pt requesting to spend last 5 minutes of today's session on the nustep for cardio respiratory endurance. Otherwise ended session without report of increased pain/fatigue.    Rehab Potential Good   PT Frequency 2x / week   PT Duration 8 weeks   PT Treatment/Interventions ADLs/Self Care Home Management;Aquatic Therapy;Moist Heat;Therapeutic exercise;Therapeutic activities;Functional mobility training;Stair training;Gait training;Balance training;Neuromuscular re-education;Patient/family education;Orthotic Fit/Training;Manual techniques;Passive range of motion   PT Next Visit Plan Progress to BLE strengthening (hip extension/abuction) and hip flexibilty with more  closed chain exercises (per knee pain) and increase balance activities next session.      PT Home Exercise Plan supine ab set, bridge, clamshell with blue TB, supine hip flexor stretch, standing calf raises x20 reps  Consulted and Agree with Plan of Care Patient      Patient will benefit from skilled therapeutic intervention in order to improve the following deficits and impairments:  Abnormal gait, Decreased activity tolerance, Decreased safety awareness, Decreased strength, Impaired flexibility, Postural dysfunction, Pain, Improper body mechanics, Decreased range of motion, Decreased balance, Decreased endurance, Decreased mobility, Difficulty walking  Visit Diagnosis: Unsteadiness on feet  Muscle weakness (generalized)  Other abnormalities of gait and mobility     Problem List Patient Active Problem List   Diagnosis Date Noted  . Insomnia 07/20/2016  . Restless legs 07/20/2016  . Spinal stenosis in cervical region 10/14/2015  . Senile purpura ([place]) 04/18/2015  . Osteopenia 01/28/2015  . Spinal stenosis of lumbar region 01/20/2015  . BPH (benign prostatic hyperplasia) 01/20/2015  . Diverticulosis of colon without hemorrhage   . Encounter for screening colonoscopy 11/21/2014  . HTN (hypertension), benign 04/17/2014  . Temporal arteritis ([place]) 04/17/2014    1:53 PM,09/30/16 Elly Modena PT, DPT [facility] [place] [address] [place], [place], [address] Phone: [phone]   Fax:  [phone]  Name: Gerald Hall MRN: [id number] Date of Birth: Nov 04, 1934

## 2016-10-04 ENCOUNTER — Encounter: Payer: Self-pay | Admitting: Family Medicine

## 2016-10-05 ENCOUNTER — Telehealth (HOSPITAL_COMMUNITY): Payer: Self-pay | Admitting: Physical Therapy

## 2016-10-05 ENCOUNTER — Ambulatory Visit (HOSPITAL_COMMUNITY): Payer: Medicare Other | Admitting: Physical Therapy

## 2016-10-05 ENCOUNTER — Ambulatory Visit: Payer: Medicare Other | Admitting: Family Medicine

## 2016-10-05 DIAGNOSIS — M6281 Muscle weakness (generalized): Secondary | ICD-10-CM

## 2016-10-05 DIAGNOSIS — R2681 Unsteadiness on feet: Secondary | ICD-10-CM | POA: Diagnosis not present

## 2016-10-05 DIAGNOSIS — R2689 Other abnormalities of gait and mobility: Secondary | ICD-10-CM | POA: Diagnosis not present

## 2016-10-05 NOTE — Therapy (Signed)
[place], [place], [address] Phone: [phone]   Fax:  [phone]  Physical Therapy Treatment  Patient Details  Name: Gerald Hall MRN: [id number] Date of Birth: 30-Oct-1934 Referring Provider: Sallee Lange, MD  Encounter Date: 10/05/2016      PT End of Session - 10/05/16 1414    Visit Number 13   Number of Visits 17   Date for PT Re-Evaluation 11/01/2016   Authorization Type Medicare part A & B   Authorization Time Period 06-Sep-2016 to 11-01-2016 (G-codes done 9th visit)   Authorization - Visit Number 13   Authorization - Number of Visits 19   PT Start Time [id number]   PT Stop Time 1425   PT Time Calculation (min) 38 min   Equipment Utilized During Treatment Gait belt   Activity Tolerance Patient tolerated treatment well;Patient limited by fatigue;Patient limited by pain;No increased pain   Behavior During Therapy WFL for tasks assessed/performed      Past Medical History:  Diagnosis Date  . Blood in urine    Sees urology yearly  . Deafness in right ear age 81  . Hyperlipidemia     Past Surgical History:  Procedure Laterality Date  . BACK SURGERY  2010  . CATARACT EXTRACTION Bilateral 07/30/14, 08/13/2014  . CERVICAL SPINE SURGERY  09/01/15   C 4-5 , [place]  . COLONOSCOPY  12/19/2007   RMR: 1. External hemorrohids, otherwise normal rectum.2. Normal colon. 3. Normal terminal ileum.  . COLONOSCOPY N/A 12/13/2014   Procedure: COLONOSCOPY;  Surgeon: Daneil Dolin, MD;  [facility];  Service: Endoscopy;  Laterality: N/A;  11:15 Pt Request Time  . KNEE SURGERY Left    around 2000, arthroscopic    There were no vitals filed for this visit.      Subjective Assessment - 10/05/16 1351    Subjective Pt reports things are going well. He has    Pertinent History L4/L5 surgery, Cx surgery, HLD   Patient Stated Goals improve strength and balance    Pain Onset More than a month ago                          [facility] Adult PT Treatment/Exercise - 10/05/16 0001      Knee/Hip Exercises: Standing   Heel Raises 2 sets;10 reps   Heel Raises Limitations single leg      Knee/Hip Exercises: Seated   Sit to Sand 1 set;10 reps;without UE support             Balance Exercises - 10/05/16 1434      Balance Exercises: Standing   Tandem Stance Eyes open;Intermittent upper extremity support;2 reps;30 secs;Other reps (comment)  7 sec at a time without UE support.    SLS Eyes open;2 reps;30 secs;Intermittent upper extremity support;Other reps (comment)  LE propped on 12" box   Rockerboard Anterior/posterior;Lateral;EO;Other (comment);Intermittent UE support  x2 min each direction   Step Over Hurdles / Cones x3 RT sideways and forwards, using UE support sideways, 4' hurdle   Other Standing Exercises SLS with LE propped on 12" box, with trunk rotation x10 reps holding green Physioball           PT Education - 10/05/16 1413    Education provided Yes   Education Details discussed progress noted with balance activity; discussed scheduling issues with availability and his upcoming reassessment    Person(s) Educated Patient  Methods Explanation   Comprehension Verbalized understanding          PT Short Term Goals - 08/17/16 1458      PT SHORT TERM GOAL #1   Title Pt will demo consistency and independence with his HEP to improve strength and mobility.    Time 2   Period Weeks   Status New     PT SHORT TERM GOAL #2   Title Pt will demo improved hamstring length to lacking no more than 25 deg BLE, to improve sitting posture.   Time 4   Period Weeks   Status New     PT SHORT TERM GOAL #3   Title Pt will demo correct log roll technique without cues from therapist atleast 3 trials, to decrease strain on his back during transitions in/out of bed.    Time 3   Period Weeks   Status New           PT Long Term Goals - 08/17/16 1500      PT LONG  TERM GOAL #1   Title Pt will demo improved BLE strength to atleast 4+/5 MMT, to increase his safety with functional activity.    Time 8   Period Weeks   Status New     PT LONG TERM GOAL #2   Title Pt will perform 5x sit to stand without UE support in less than 12 sec, to improve his safety/independence getting in and out of chairs at home.    Time 8   Period Weeks   Status New     PT LONG TERM GOAL #3   Title Pt will perform TUG in less than 13 sec, with LRAD to indicate he is at a decreased risk of falling in the community.    Time 8   Period Weeks   Status New     PT LONG TERM GOAL #4   Title Pt will perform SLS on each LE up to 10 sec without LOB, 3/5 trials, to decrease his risk of falling during ambulation/stair negotiation.    Time 8   Period Weeks   Status New               Plan - 10/05/16 1425    Clinical Impression Statement Continued this session with balance activity and pt demonstrating improvements in tandem balance, able to hold up to 7-8 sec without UE support to attain position. Pt able to perform all activities without exacerbation of pain or report of fatigue. Will continue with current POC.   Rehab Potential Good   PT Frequency 2x / week   PT Duration 8 weeks   PT Treatment/Interventions ADLs/Self Care Home Management;Aquatic Therapy;Moist Heat;Therapeutic exercise;Therapeutic activities;Functional mobility training;Stair training;Gait training;Balance training;Neuromuscular re-education;Patient/family education;Orthotic Fit/Training;Manual techniques;Passive range of motion   PT Next Visit Plan ankle flexibility; Progress BLE strengthening (hip extension/abuction) and hip flexibilty with more closed chain exercises (per knee pain) and increase balance activities next session.      PT Home Exercise Plan supine ab set, bridge, clamshell with blue TB, supine hip flexor stretch, standing calf raises x20 reps    Consulted and Agree with Plan of Care Patient       Patient will benefit from skilled therapeutic intervention in order to improve the following deficits and impairments:  Abnormal gait, Decreased activity tolerance, Decreased safety awareness, Decreased strength, Impaired flexibility, Postural dysfunction, Pain, Improper body mechanics, Decreased range of motion, Decreased balance, Decreased endurance, Decreased mobility, Difficulty walking  Visit Diagnosis:  Unsteadiness on feet  Muscle weakness (generalized)  Other abnormalities of gait and mobility     Problem List Patient Active Problem List   Diagnosis Date Noted  . Insomnia 07/20/2016  . Restless legs 07/20/2016  . Spinal stenosis in cervical region 10/14/2015  . Senile purpura ([place]) 04/18/2015  . Osteopenia 01/28/2015  . Spinal stenosis of lumbar region 01/20/2015  . BPH (benign prostatic hyperplasia) 01/20/2015  . Diverticulosis of colon without hemorrhage   . Encounter for screening colonoscopy 11/21/2014  . HTN (hypertension), benign 04/17/2014  . Temporal arteritis ([place]) 04/17/2014    2:34 PM,10/05/16 Elly Modena PT, DPT [facility] [place] [address] [place], [place], [address] Phone: [phone]   Fax:  [phone]  Name: LESTHER MUSTARD MRN: [id number] Date of Birth: 1935/03/25

## 2016-10-05 NOTE — Telephone Encounter (Signed)
Called pt he agreed to cx this apptment due to CR not being here.

## 2016-10-07 ENCOUNTER — Ambulatory Visit (HOSPITAL_COMMUNITY): Payer: Medicare Other | Admitting: Physical Therapy

## 2016-10-07 DIAGNOSIS — M6281 Muscle weakness (generalized): Secondary | ICD-10-CM

## 2016-10-07 DIAGNOSIS — R2689 Other abnormalities of gait and mobility: Secondary | ICD-10-CM

## 2016-10-07 DIAGNOSIS — R2681 Unsteadiness on feet: Secondary | ICD-10-CM

## 2016-10-07 NOTE — Therapy (Signed)
[place], [place], [address] Phone: [phone]   Fax:  [phone]  Physical Therapy Treatment  Patient Details  Name: Gerald Hall MRN: [id number] Date of Birth: [birth year] Referring Provider: Sallee Lange, MD  Encounter Date: 10/07/2016      PT End of Session - 10/07/16 1442    Visit Number 14   Number of Visits 17   Date for PT Re-Evaluation October 17, 2016   Authorization Type Medicare part A & B   Authorization Time Period 08-22-2016 to 17-Oct-2016 (G-codes done 9th visit)   Authorization - Visit Number 14   Authorization - Number of Visits 19   PT Start Time 1301   PT Stop Time 1342   PT Time Calculation (min) 41 min   Equipment Utilized During Treatment Gait belt   Activity Tolerance Patient tolerated treatment well;Patient limited by fatigue;Patient limited by pain;No increased pain   Behavior During Therapy WFL for tasks assessed/performed      Past Medical History:  Diagnosis Date  . Blood in urine    Sees urology yearly  . Deafness in right ear age 53  . Hyperlipidemia     Past Surgical History:  Procedure Laterality Date  . BACK SURGERY  2010  . CATARACT EXTRACTION Bilateral 07/30/14, 08/13/2014  . CERVICAL SPINE SURGERY  09/01/15   C 4-5 , [place]  . COLONOSCOPY  12/19/2007   RMR: 1. External hemorrohids, otherwise normal rectum.2. Normal colon. 3. Normal terminal ileum.  . COLONOSCOPY N/A 12/13/2014   Procedure: COLONOSCOPY;  Surgeon: Daneil Dolin, MD;  [facility];  Service: Endoscopy;  Laterality: N/A;  11:15 Pt Request Time  . KNEE SURGERY Left    around 2000, arthroscopic    There were no vitals filed for this visit.      Subjective Assessment - 10/07/16 1306    Subjective Pt reports things are going well. No worse, no better.    Pertinent History L4/L5 surgery, Cx surgery, HLD   Patient Stated Goals improve strength and balance    Pain Onset More than a  month ago                         [facility] Adult PT Treatment/Exercise - 10/07/16 0001      Knee/Hip Exercises: Stretches   Hip Flexor Stretch 3 reps;30 seconds     Knee/Hip Exercises: Standing   Other Standing Knee Exercises balance loss recovery x10 reps ant/post/lateral directions   (+) leading with RLE     Knee/Hip Exercises: Prone   Hip Extension Both;2 sets;10 reps     Manual Therapy   Manual Therapy Passive ROM   Manual therapy comments separate rest of session   Passive ROM Prone quad stretch 3x30 sec each              Balance Exercises - 10/07/16 1326      Balance Exercises: Standing   Standing Eyes Opened Narrow base of support (BOS);Other (comment);Foam/compliant surface  trunk turns Lt/Rt x15 reps    Standing Eyes Closed Narrow base of support (BOS);Foam/compliant surface;2 reps;30 secs   SLS 2 reps;30 secs;Eyes open  LE propped on 12" box    Tandem Gait Intermittent upper extremity support;Forward;2 reps;Other (comment)  30 sec hold after each step   Sidestepping 3 reps;Other (comment)  in // bars            PT Education - 10/07/16  1441    Education provided Yes   Education Details purpose of new exercises performed during session   Person(s) Educated Patient   Methods Explanation   Comprehension Verbalized understanding          PT Short Term Goals - 08/17/16 1458      PT SHORT TERM GOAL #1   Title Pt will demo consistency and independence with his HEP to improve strength and mobility.    Time 2   Period Weeks   Status New     PT SHORT TERM GOAL #2   Title Pt will demo improved hamstring length to lacking no more than 25 deg BLE, to improve sitting posture.   Time 4   Period Weeks   Status New     PT SHORT TERM GOAL #3   Title Pt will demo correct log roll technique without cues from therapist atleast 3 trials, to decrease strain on his back during transitions in/out of bed.    Time 3   Period Weeks   Status New            PT Long Term Goals - 08/17/16 1500      PT LONG TERM GOAL #1   Title Pt will demo improved BLE strength to atleast 4+/5 MMT, to increase his safety with functional activity.    Time 8   Period Weeks   Status New     PT LONG TERM GOAL #2   Title Pt will perform 5x sit to stand without UE support in less than 12 sec, to improve his safety/independence getting in and out of chairs at home.    Time 8   Period Weeks   Status New     PT LONG TERM GOAL #3   Title Pt will perform TUG in less than 13 sec, with LRAD to indicate he is at a decreased risk of falling in the community.    Time 8   Period Weeks   Status New     PT LONG TERM GOAL #4   Title Pt will perform SLS on each LE up to 10 sec without LOB, 3/5 trials, to decrease his risk of falling during ambulation/stair negotiation.    Time 8   Period Weeks   Status New               Plan - 10/07/16 1442    Clinical Impression Statement This session continued with balance activity and hip strengthening/flexibility. Performed balance loss recovery noting pt has significant delays in this, requiring therapist assistance or relying heavily on the wall for addition support. Also noting preference to lead with his RLE during steppage recovery likely due to his heavy dependence on his LLE for support. Discussed upcoming reassessment and pt verbalized understanding.   Rehab Potential Good   PT Frequency 2x / week   PT Duration 8 weeks   PT Treatment/Interventions ADLs/Self Care Home Management;Aquatic Therapy;Moist Heat;Therapeutic exercise;Therapeutic activities;Functional mobility training;Stair training;Gait training;Balance training;Neuromuscular re-education;Patient/family education;Orthotic Fit/Training;Manual techniques;Passive range of motion   PT Next Visit Plan Reassessment next visit    PT Home Exercise Plan supine ab set, bridge, clamshell with blue TB, supine hip flexor stretch, standing calf raises x20 reps     Consulted and Agree with Plan of Care Patient      Patient will benefit from skilled therapeutic intervention in order to improve the following deficits and impairments:  Abnormal gait, Decreased activity tolerance, Decreased safety awareness, Decreased strength, Impaired flexibility, Postural dysfunction, Pain,  Improper body mechanics, Decreased range of motion, Decreased balance, Decreased endurance, Decreased mobility, Difficulty walking  Visit Diagnosis: Unsteadiness on feet  Muscle weakness (generalized)  Other abnormalities of gait and mobility     Problem List Patient Active Problem List   Diagnosis Date Noted  . Insomnia 07/20/2016  . Restless legs 07/20/2016  . Spinal stenosis in cervical region 10/14/2015  . Senile purpura ([place]) 04/18/2015  . Osteopenia 01/28/2015  . Spinal stenosis of lumbar region 01/20/2015  . BPH (benign prostatic hyperplasia) 01/20/2015  . Diverticulosis of colon without hemorrhage   . Encounter for screening colonoscopy 11/21/2014  . HTN (hypertension), benign 04/17/2014  . Temporal arteritis ([place]) 04/17/2014   2:47 PM,10/07/16 Elly Modena PT, DPT [facility] [address] [place], [place], [address] Phone: [phone]   Fax:  [phone]  Name: Gerald Hall MRN: [id number] Date of Birth: 1935/06/13

## 2016-10-12 ENCOUNTER — Ambulatory Visit (HOSPITAL_COMMUNITY): Payer: Medicare Other | Admitting: Physical Therapy

## 2016-10-12 DIAGNOSIS — R2681 Unsteadiness on feet: Secondary | ICD-10-CM

## 2016-10-12 DIAGNOSIS — M6281 Muscle weakness (generalized): Secondary | ICD-10-CM

## 2016-10-12 DIAGNOSIS — R2689 Other abnormalities of gait and mobility: Secondary | ICD-10-CM

## 2016-10-12 NOTE — Therapy (Signed)
[place], [place], [address] Phone: [phone]   Fax:  [phone]  Physical Therapy Treatment/Reassessment   Patient Details  Name: Gerald Hall MRN: [id number] Date of Birth: January 21, 1935 Referring Provider: Sallee Lange, MD  Encounter Date: 21-Oct-2016      PT End of Session - 10/21/2016 1614    Visit Number 15   Number of Visits 27   Date for PT Re-Evaluation October 21, 2016   Authorization Type Medicare part A & B   Authorization Time Period 26-Aug-2016 to October 21, 2016 (G-codes done 15th visit) NEW: October 22, 2016 to 11/24/15   Authorization - Visit Number 15   Authorization - Number of Visits 27   PT Start Time 7680   PT Stop Time 1345   PT Time Calculation (min) 43 min   Equipment Utilized During Treatment Gait belt   Activity Tolerance Patient tolerated treatment well;Patient limited by fatigue;Patient limited by pain;No increased pain   Behavior During Therapy WFL for tasks assessed/performed      Past Medical History:  Diagnosis Date  . Blood in urine    Sees urology yearly  . Deafness in right ear age 35  . Hyperlipidemia     Past Surgical History:  Procedure Laterality Date  . BACK SURGERY  2010  . CATARACT EXTRACTION Bilateral 07/30/14, 08/13/2014  . CERVICAL SPINE SURGERY  09/01/15   C 4-5 , [place]  . COLONOSCOPY  12/19/2007   RMR: 1. External hemorrohids, otherwise normal rectum.2. Normal colon. 3. Normal terminal ileum.  . COLONOSCOPY N/A 12/13/2014   Procedure: COLONOSCOPY;  Surgeon: Daneil Dolin, MD;  [facility];  Service: Endoscopy;  Laterality: N/A;  11:15 Pt Request Time  . KNEE SURGERY Left    around 2000, arthroscopic    There were no vitals filed for this visit.      Subjective Assessment - 10-21-16 1302    Subjective Pt reports that things are going good. He has been doing his HEP and reports no issues with these. He feels that he has made progress but is still  not where he hoped as far as his balance is concerned.    Pertinent History L4/L5 surgery, Cx surgery, HLD   How long can you sit comfortably? unlimited    How long can you stand comfortably? 5-10 minutes    How long can you walk comfortably? 5-10 minutes    Patient Stated Goals improve strength and balance    Currently in Pain? No/denies   Pain Onset More than a month ago            [facility] PT Assessment - 10/21/2016 0001      Assessment   Medical Diagnosis General weakness, unsteady gait    Referring Provider Sallee Lange, MD   Onset Date/Surgical Date --  approx 1 year ago   Next MD Visit "not for a while"    Prior Therapy HHPT for 8-10 visits     Precautions   Precautions None     Balance Screen   Has the patient fallen in the past 6 months No   Has the patient had a decrease in activity level because of a fear of falling?  Yes   Is the patient reluctant to leave their home because of a fear of falling?  No     Home Social worker Private residence   Living Arrangements Alone   Additional Comments ramp  Prior Function   Level of Independence Independent   Vocation Retired     Charity fundraiser Status Within Functional Limits for tasks assessed     Observation/Other Assessments   Focus on Therapeutic Outcomes (FOTO)  54% limited      Sensation   Light Touch Appears Intact     Posture/Postural Control   Posture/Postural Control Postural limitations   Postural Limitations Posterior pelvic tilt;Flexed trunk;Forward head;Rounded Shoulders     Strength   Right Hip Flexion 5/5   Right Hip Extension 4/5   Right Hip ABduction 4/5   Left Hip Flexion 5/5   Left Hip Extension 4/5   Left Hip ABduction 4/5   Right Knee Flexion 4-/5   Right Knee Extension 5/5   Left Knee Flexion 4-/5   Left Knee Extension 5/5   Right Ankle Dorsiflexion 5/5   Left Ankle Dorsiflexion 5/5     Flexibility   Soft Tissue Assessment /Muscle Length yes    Hamstrings Rt: 30 deg, Lt: 40 deg      Transfers   Five time sit to stand comments  14.7 sec, No UE     Ambulation/Gait   Gait Comments forward flexed posture, rollator out in front     Standardized Balance Assessment   Standardized Balance Assessment Berg Balance Test     Berg Balance Test   Sit to Stand Able to stand without using hands and stabilize independently   Standing Unsupported Able to stand safely 2 minutes   Sitting with Back Unsupported but Feet Supported on Floor or Stool Able to sit safely and securely 2 minutes   Stand to Sit Sits safely with minimal use of hands   Transfers Able to transfer safely, minor use of hands   Standing Unsupported with Eyes Closed Able to stand 10 seconds with supervision  20 sec with SBA   Standing Ubsupported with Feet Together Able to place feet together independently and stand 1 minute safely   From Standing, Reach Forward with Outstretched Arm Can reach forward >5 cm safely (2")  4 inches    From Standing Position, Pick up Object from Floor Unable to pick up and needs supervision   From Standing Position, Turn to Look Behind Over each Shoulder Needs supervision when turning   Turn 360 Degrees Able to turn 360 degrees safely but slowly  Rt 11 sec, Lt 11 sec    Standing Unsupported, Alternately Place Feet on Step/Stool Able to complete >2 steps/needs minimal assist  grabbed for support after 1 step    Standing Unsupported, One Foot in Front Needs help to step but can hold 15 seconds  Lt forward 30 sec, Rt foward 15 sec (stepping indep)   Standing on One Leg Able to lift leg independently and hold equal to or more than 3 seconds  Lt: 3-10 sec, 4 trials; Rt: 6-7 sec   Total Score 37     High Level Balance   High Level Balance Comments 13.5 sec, with rollator                              PT Education - 10/12/16 1613    Education provided Yes   Education Details noted progress with goals and objective  measurements; updated HEP to encourage daily walking    Person(s) Educated Patient   Methods Explanation   Comprehension Verbalized understanding          PT  Short Term Goals - 10/12/16 1334      PT SHORT TERM GOAL #1   Title Pt will demo consistency and independence with his HEP to improve strength and mobility.    Time 2   Period Weeks   Status On-going     PT SHORT TERM GOAL #2   Title Pt will demo improved hamstring length to lacking no more than 25 deg BLE, to improve sitting posture.   Time 4   Period Weeks   Status New     PT SHORT TERM GOAL #3   Title Pt will demo correct log roll technique without cues from therapist atleast 3 trials, to decrease strain on his back during transitions in/out of bed.    Time 3   Period Weeks   Status Partially Met           PT Long Term Goals - 10/12/16 1335      PT LONG TERM GOAL #1   Title Pt will demo improved BLE strength to atleast 4+/5 MMT, to increase his safety with functional activity.    Time 8   Period Weeks   Status New     PT LONG TERM GOAL #2   Title Pt will perform 5x sit to stand without UE support in less than 12 sec, to improve his safety/independence getting in and out of chairs at home.    Baseline 14.7 sec, no UE   Time 8   Period Weeks   Status Partially Met     PT LONG TERM GOAL #3   Title Pt will perform TUG in less than 13 sec, with LRAD to indicate he is at a decreased risk of falling in the community.    Baseline 13.5 sec using rollator   Time 8   Period Weeks   Status Achieved     PT LONG TERM GOAL #4   Title Pt will perform SLS on each LE up to 10 sec without LOB, 3/5 trials, to decrease his risk of falling during ambulation/stair negotiation.    Baseline 1/5 trials   Time 8   Period Weeks   Status Partially Met               Plan - 10/12/16 1615    Clinical Impression Statement Mr. Octavious has made good progress towards his goals since beginning PT having met 2 and progressed  towards all others. He is consistently performing his HEP without difficulty and demonstrates improved BLE strength and functional mobility with 5x sit to stand/TUG testing. He also reports improvements in activity tolerance and overall pain report in his Rt quad. His balance has been an area of concern for him and he demonstrated a 12 point improvement on the Berg balance test. He continues to demonstrate impairments in LE strength and balance impairing his safety with functional activity and placing him at an increased risk of falling at home or in the community. He would benefit from continued skilled PT to address his remaining limitations and improve his independence and safety.    Rehab Potential Good   PT Frequency 2x / week   PT Duration 8 weeks   PT Treatment/Interventions ADLs/Self Care Home Management;Aquatic Therapy;Moist Heat;Therapeutic exercise;Therapeutic activities;Functional mobility training;Stair training;Gait training;Balance training;Neuromuscular re-education;Patient/family education;Orthotic Fit/Training;Manual techniques;Passive range of motion   PT Next Visit Plan trunk strengthening in sitting; hip strength progressions; hamstring/hip flexor flexibility; balance (single leg stance)   PT Home Exercise Plan walking 3 min/day; supine ab set, bridge, clamshell  with blue TB, supine hip flexor stretch, standing calf raises x20 reps    Consulted and Agree with Plan of Care Patient      Patient will benefit from skilled therapeutic intervention in order to improve the following deficits and impairments:  Abnormal gait, Decreased activity tolerance, Decreased safety awareness, Decreased strength, Impaired flexibility, Postural dysfunction, Pain, Improper body mechanics, Decreased range of motion, Decreased balance, Decreased endurance, Decreased mobility, Difficulty walking  Visit Diagnosis: Unsteadiness on feet  Muscle weakness (generalized)  Other abnormalities of gait and  mobility       G-Codes - Oct 30, 2016 1620    Functional Assessment Tool Used Clinical judgement based on assessment of balance, strength and functional mobility   Functional Limitation Mobility: Walking and moving around   Mobility: Walking and Moving Around Current Status [id number]) At least 40 percent but less than 60 percent impaired, limited or restricted   Mobility: Walking and Moving Around Goal Status [id number]) At least 40 percent but less than 60 percent impaired, limited or restricted      Problem List Patient Active Problem List   Diagnosis Date Noted  . Insomnia 07/20/2016  . Restless legs 07/20/2016  . Spinal stenosis in cervical region 10/14/2015  . Senile purpura ([place]) 04/18/2015  . Osteopenia 01/28/2015  . Spinal stenosis of lumbar region 01/20/2015  . BPH (benign prostatic hyperplasia) 01/20/2015  . Diverticulosis of colon without hemorrhage   . Encounter for screening colonoscopy 11/21/2014  . HTN (hypertension), benign 04/17/2014  . Temporal arteritis ([place]) 04/17/2014   4:22 PM,October 30, 2016 Elly Modena PT, DPT [facility] [place] [address] [place], [place], [address] Phone: [phone]   Fax:  [phone]  Name: Gerald Hall MRN: [id number] Date of Birth: 03-26-1935

## 2016-10-14 ENCOUNTER — Ambulatory Visit (HOSPITAL_COMMUNITY): Payer: Medicare Other | Admitting: Physical Therapy

## 2016-10-14 DIAGNOSIS — R2689 Other abnormalities of gait and mobility: Secondary | ICD-10-CM

## 2016-10-14 DIAGNOSIS — M6281 Muscle weakness (generalized): Secondary | ICD-10-CM | POA: Diagnosis not present

## 2016-10-14 DIAGNOSIS — R2681 Unsteadiness on feet: Secondary | ICD-10-CM | POA: Diagnosis not present

## 2016-10-14 NOTE — Therapy (Signed)
[place], [place], [address] Phone: [phone]   Fax:  [phone]  Physical Therapy Treatment  Patient Details  Name: Gerald Hall MRN: [id number] Date of Birth: 05-Oct-1935 Referring Provider: Sallee Lange, MD  Encounter Date: 10/14/2016      PT End of Session - 10/14/16 1438    Visit Number 16   Number of Visits 27   Date for PT Re-Evaluation 11/23/16   Authorization Type Medicare part A & B   Authorization Time Period 2016-09-13 to 11-08-2016 (G-codes done 15th visit) NEW: November 09, 2016 to 11/24/15   Authorization - Visit Number 16   Authorization - Number of Visits 27   PT Start Time 1301   PT Stop Time 1343   PT Time Calculation (min) 42 min   Equipment Utilized During Treatment Gait belt   Activity Tolerance Patient tolerated treatment well;Patient limited by fatigue;Patient limited by pain;No increased pain   Behavior During Therapy WFL for tasks assessed/performed      Past Medical History:  Diagnosis Date  . Blood in urine    Sees urology yearly  . Deafness in right ear age 13  . Hyperlipidemia     Past Surgical History:  Procedure Laterality Date  . BACK SURGERY  2010  . CATARACT EXTRACTION Bilateral 07/30/14, 08/13/2014  . CERVICAL SPINE SURGERY  09/01/15   C 4-5 , [place]  . COLONOSCOPY  12/19/2007   RMR: 1. External hemorrohids, otherwise normal rectum.2. Normal colon. 3. Normal terminal ileum.  . COLONOSCOPY N/A 12/13/2014   Procedure: COLONOSCOPY;  Surgeon: Daneil Dolin, MD;  [facility];  Service: Endoscopy;  Laterality: N/A;  11:15 Pt Request Time  . KNEE SURGERY Left    around 2000, arthroscopic    There were no vitals filed for this visit.      Subjective Assessment - 10/14/16 1308    Subjective Pt has no complaints currently other than minot    Pertinent History L4/L5 surgery, Cx surgery, HLD   How long can you sit comfortably? unlimited    How long  can you stand comfortably? 5-10 minutes    How long can you walk comfortably? 5-10 minutes    Patient Stated Goals improve strength and balance    Currently in Pain? No/denies   Pain Onset More than a month ago                         [facility] Adult PT Treatment/Exercise - 10/14/16 0001      Knee/Hip Exercises: Stretches   Hip Flexor Stretch Both;2 reps;30 seconds   Hip Flexor Stretch Limitations BLE   Gastroc Stretch Both;2 reps;30 seconds   Gastroc Stretch Limitations slantboard      Knee/Hip Exercises: Prone   Straight Leg Raises 1 set;10 reps;Both   Straight Leg Raises Limitations opposite arm/leg      Manual Therapy   Manual Therapy Muscle Energy Technique;Passive ROM   Manual therapy comments separate rest of session   Passive ROM Rt piriformis stretch 2x30 sec, B hamstring stretch 3x30 sec, prone quad stretch 3x30 sec each    Muscle Energy Technique x3 reps contract relax stretch, B quads             Balance Exercises - 10/14/16 1345      Balance Exercises: Standing   SLS Eyes open;2 reps;30 secs  max 10 sec each LE; SLS with LE propped on bolster  2x20 reps   Cone Rotation Foam/compliant surface;Upper extremity assist 1;Right turn;Left turn;Other (comment)  MinA at times to prevent LOB           PT Education - 10/14/16 1438    Education provided Yes   Education Details technique with exercises; noted improvements in activities   Person(s) Educated Patient   Methods Explanation;Demonstration   Comprehension Verbalized understanding;Returned demonstration          PT Short Term Goals - 10/12/16 1334      PT SHORT TERM GOAL #1   Title Pt will demo consistency and independence with his HEP to improve strength and mobility.    Time 2   Period Weeks   Status On-going     PT SHORT TERM GOAL #2   Title Pt will demo improved hamstring length to lacking no more than 25 deg BLE, to improve sitting posture.   Time 4   Period Weeks   Status  New     PT SHORT TERM GOAL #3   Title Pt will demo correct log roll technique without cues from therapist atleast 3 trials, to decrease strain on his back during transitions in/out of bed.    Time 3   Period Weeks   Status Partially Met           PT Long Term Goals - 10/12/16 1335      PT LONG TERM GOAL #1   Title Pt will demo improved BLE strength to atleast 4+/5 MMT, to increase his safety with functional activity.    Time 8   Period Weeks   Status New     PT LONG TERM GOAL #2   Title Pt will perform 5x sit to stand without UE support in less than 12 sec, to improve his safety/independence getting in and out of chairs at home.    Baseline 14.7 sec, no UE   Time 8   Period Weeks   Status Partially Met     PT LONG TERM GOAL #3   Title Pt will perform TUG in less than 13 sec, with LRAD to indicate he is at a decreased risk of falling in the community.    Baseline 13.5 sec using rollator   Time 8   Period Weeks   Status Achieved     PT LONG TERM GOAL #4   Title Pt will perform SLS on each LE up to 10 sec without LOB, 3/5 trials, to decrease his risk of falling during ambulation/stair negotiation.    Baseline 1/5 trials   Time 8   Period Weeks   Status Partially Met               Plan - 10/14/16 1439    Clinical Impression Statement Beginning of today's session was spent addressing BLE flexibility. The remainder of the session focused on balance activity to improve safety with single balance and reaching activity at home. Pt demonstrated several LOB during rotational reaching activity, and required MinA to prevent falls. He has been walking more at home after the discussion at his most recent reassessment. Will continue with current POC.   Rehab Potential Good   PT Frequency 2x / week   PT Duration 8 weeks   PT Treatment/Interventions ADLs/Self Care Home Management;Aquatic Therapy;Moist Heat;Therapeutic exercise;Therapeutic activities;Functional mobility  training;Stair training;Gait training;Balance training;Neuromuscular re-education;Patient/family education;Orthotic Fit/Training;Manual techniques;Passive range of motion   PT Next Visit Plan trunk strengthening in sitting; hip strength progressions; hamstring/hip flexor flexibility; balance (single leg stance and  reaching activity)    PT Home Exercise Plan walking 3 min/day; supine ab set, bridge, clamshell with blue TB, supine hip flexor stretch, standing calf raises x20 reps    Recommended Other Services none   Consulted and Agree with Plan of Care Patient      Patient will benefit from skilled therapeutic intervention in order to improve the following deficits and impairments:  Abnormal gait, Decreased activity tolerance, Decreased safety awareness, Decreased strength, Impaired flexibility, Postural dysfunction, Pain, Improper body mechanics, Decreased range of motion, Decreased balance, Decreased endurance, Decreased mobility, Difficulty walking  Visit Diagnosis: Unsteadiness on feet  Muscle weakness (generalized)  Other abnormalities of gait and mobility     Problem List Patient Active Problem List   Diagnosis Date Noted  . Insomnia 07/20/2016  . Restless legs 07/20/2016  . Spinal stenosis in cervical region 10/14/2015  . Senile purpura ([place]) 04/18/2015  . Osteopenia 01/28/2015  . Spinal stenosis of lumbar region 01/20/2015  . BPH (benign prostatic hyperplasia) 01/20/2015  . Diverticulosis of colon without hemorrhage   . Encounter for screening colonoscopy 11/21/2014  . HTN (hypertension), benign 04/17/2014  . Temporal arteritis ([place]) 04/17/2014   3:25 PM,10/14/16 Elly Modena PT, DPT [facility] [place] [address] [place], [place], [address] Phone: [phone]   Fax:  [phone]  Name: Gerald Hall MRN: [id number] Date of Birth: 1935-09-27

## 2016-10-19 ENCOUNTER — Ambulatory Visit (HOSPITAL_COMMUNITY): Payer: Medicare Other | Admitting: Physical Therapy

## 2016-10-23 ENCOUNTER — Other Ambulatory Visit: Payer: Self-pay | Admitting: Family Medicine

## 2016-10-27 ENCOUNTER — Ambulatory Visit (HOSPITAL_COMMUNITY): Payer: Medicare Other | Attending: Family Medicine

## 2016-10-27 DIAGNOSIS — R2681 Unsteadiness on feet: Secondary | ICD-10-CM | POA: Insufficient documentation

## 2016-10-27 DIAGNOSIS — R2689 Other abnormalities of gait and mobility: Secondary | ICD-10-CM | POA: Insufficient documentation

## 2016-10-27 DIAGNOSIS — M6281 Muscle weakness (generalized): Secondary | ICD-10-CM | POA: Diagnosis not present

## 2016-10-27 NOTE — Therapy (Signed)
[place], [place], [address] Phone: [phone]   Fax:  [phone]  Physical Therapy Treatment  Patient Details  Name: Gerald Hall MRN: [id number] Date of Birth: [birth year] Referring Provider: Sallee Lange, MD  Encounter Date: 10/27/2016      PT End of Session - 10/27/16 1313    Visit Number 17   Number of Visits 27   Date for PT Re-Evaluation 11/23/16   Authorization Type Medicare part A & B   Authorization Time Period 2016/09/08 to 11/03/2016 (G-codes done 15th visit) NEW: 04-Nov-2016 to 11/24/15   Authorization - Visit Number 17   Authorization - Number of Visits 27   PT Start Time 1300   PT Stop Time 1348   PT Time Calculation (min) 48 min   Equipment Utilized During Treatment Gait belt   Activity Tolerance Patient tolerated treatment well;No increased pain;Patient limited by fatigue   Behavior During Therapy [facility] for tasks assessed/performed      Past Medical History:  Diagnosis Date  . Blood in urine    Sees urology yearly  . Deafness in right ear age 82  . Hyperlipidemia     Past Surgical History:  Procedure Laterality Date  . BACK SURGERY  2010  . CATARACT EXTRACTION Bilateral 07/30/14, 08/13/2014  . CERVICAL SPINE SURGERY  09/01/15   C 4-5 , [place]  . COLONOSCOPY  12/19/2007   RMR: 1. External hemorrohids, otherwise normal rectum.2. Normal colon. 3. Normal terminal ileum.  . COLONOSCOPY N/A 12/13/2014   Procedure: COLONOSCOPY;  Surgeon: Daneil Dolin, MD;  [facility];  Service: Endoscopy;  Laterality: N/A;  11:15 Pt Request Time  . KNEE SURGERY Left    around 2000, arthroscopic    There were no vitals filed for this visit.      Subjective Assessment - 10/27/16 1310    Subjective Pt stated he has pain anterior Rt thigh, pain scale 3-4/10   Pertinent History L4/L5 surgery, Cx surgery, HLD   Patient Stated Goals improve strength and balance    Currently in Pain?  Yes   Pain Score 4    Pain Location Leg   Pain Orientation Right;Proximal   Pain Descriptors / Indicators Sore;Aching;Tightness   Pain Type Chronic pain   Pain Onset More than a month ago   Pain Frequency Constant   Aggravating Factors  weight bearing   Pain Relieving Factors sitting   Effect of Pain on Daily Activities limited weight bearing activities                         [facility] Adult PT Treatment/Exercise - 10/27/16 0001      Knee/Hip Exercises: Stretches   Active Hamstring Stretch Both;30 seconds;2 reps   Active Hamstring Stretch Limitations supine with rope   Quad Stretch 30 seconds;Right;3 reps   Quad Stretch Limitations prone with rope   Hip Flexor Stretch Both;2 reps;30 seconds   Hip Flexor Stretch Limitations on 12in step   Gastroc Stretch Both;2 reps;30 seconds   Gastroc Stretch Limitations slantboard              Balance Exercises - 10/27/16 1359      Balance Exercises: Standing   SLS Eyes open;2 reps;30 secs  one foot on bolster   Rockerboard Anterior/posterior;Lateral  2 minutes   Cone Rotation Foam/compliant surface;Intermittent upper extremity assist   Cone Rotation Limitations on [employer]  PT Short Term Goals - 10/12/16 1334      PT SHORT TERM GOAL #1   Title Pt will demo consistency and independence with his HEP to improve strength and mobility.    Time 2   Period Weeks   Status On-going     PT SHORT TERM GOAL #2   Title Pt will demo improved hamstring length to lacking no more than 25 deg BLE, to improve sitting posture.   Time 4   Period Weeks   Status New     PT SHORT TERM GOAL #3   Title Pt will demo correct log roll technique without cues from therapist atleast 3 trials, to decrease strain on his back during transitions in/out of bed.    Time 3   Period Weeks   Status Partially Met           PT Long Term Goals - 10/12/16 1335      PT LONG TERM GOAL #1   Title Pt will demo improved BLE  strength to atleast 4+/5 MMT, to increase his safety with functional activity.    Time 8   Period Weeks   Status New     PT LONG TERM GOAL #2   Title Pt will perform 5x sit to stand without UE support in less than 12 sec, to improve his safety/independence getting in and out of chairs at home.    Baseline 14.7 sec, no UE   Time 8   Period Weeks   Status Partially Met     PT LONG TERM GOAL #3   Title Pt will perform TUG in less than 13 sec, with LRAD to indicate he is at a decreased risk of falling in the community.    Baseline 13.5 sec using rollator   Time 8   Period Weeks   Status Achieved     PT LONG TERM GOAL #4   Title Pt will perform SLS on each LE up to 10 sec without LOB, 3/5 trials, to decrease his risk of falling during ambulation/stair negotiation.    Baseline 1/5 trials   Time 8   Period Weeks   Status Partially Met               Plan - 10/27/16 1353    Clinical Impression Statement Began session addressing BLE flexibility with reports of pain resolved following stretches. Progressed to balance activities for safety to reduce risk of falls.  Pt demonstrated several LOB during rotational reaching and SLS activities requiring min A for safety.  No reports of pain at EOS, was limited by fatigue with standing tolerance required 1 seated rest break through session.   Rehab Potential Good   PT Frequency 2x / week   PT Duration 8 weeks   PT Treatment/Interventions ADLs/Self Care Home Management;Aquatic Therapy;Moist Heat;Therapeutic exercise;Therapeutic activities;Functional mobility training;Stair training;Gait training;Balance training;Neuromuscular re-education;Patient/family education;Orthotic Fit/Training;Manual techniques;Passive range of motion   PT Next Visit Plan trunk strengthening in sitting; hip strength progressions; hamstring/hip flexor flexibility; balance (single leg stance and reaching activity)    PT Home Exercise Plan walking 3 min/day; supine ab set,  bridge, clamshell with blue TB, supine hip flexor stretch, standing calf raises x20 reps       Patient will benefit from skilled therapeutic intervention in order to improve the following deficits and impairments:  Abnormal gait, Decreased activity tolerance, Decreased safety awareness, Decreased strength, Impaired flexibility, Postural dysfunction, Pain, Improper body mechanics, Decreased range of motion, Decreased balance, Decreased endurance, Decreased mobility,  Difficulty walking  Visit Diagnosis: Unsteadiness on feet  Muscle weakness (generalized)  Other abnormalities of gait and mobility     Problem List Patient Active Problem List   Diagnosis Date Noted  . Insomnia 07/20/2016  . Restless legs 07/20/2016  . Spinal stenosis in cervical region 10/14/2015  . Senile purpura ([place]) 04/18/2015  . Osteopenia 01/28/2015  . Spinal stenosis of lumbar region 01/20/2015  . BPH (benign prostatic hyperplasia) 01/20/2015  . Diverticulosis of colon without hemorrhage   . Encounter for screening colonoscopy 11/21/2014  . HTN (hypertension), benign 04/17/2014  . Temporal arteritis ([place]) 04/17/2014   Ihor Austin, [place]; [place]  Aldona Lento 10/27/2016, 2:00 PM  [place] [address] [place], [place], [address] Phone: [phone]   Fax:  [phone]  Name: Gerald Hall MRN: [id number] Date of Birth: 05-30-1935

## 2016-10-29 ENCOUNTER — Ambulatory Visit (HOSPITAL_COMMUNITY): Payer: Medicare Other

## 2016-10-29 DIAGNOSIS — R2689 Other abnormalities of gait and mobility: Secondary | ICD-10-CM | POA: Diagnosis not present

## 2016-10-29 DIAGNOSIS — M6281 Muscle weakness (generalized): Secondary | ICD-10-CM | POA: Diagnosis not present

## 2016-10-29 DIAGNOSIS — R2681 Unsteadiness on feet: Secondary | ICD-10-CM

## 2016-10-29 NOTE — Therapy (Signed)
[place], [place], [address] Phone: [phone]   Fax:  [phone]  Physical Therapy Treatment  Patient Details  Name: Gerald Hall MRN: [id number] Date of Birth: 01/12/1935 Referring Provider: Sallee Lange, MD    Encounter Date: 10/29/2016      PT End of Session - 10/29/16 1430    Visit Number 18   Number of Visits 27   Date for PT Re-Evaluation 11/23/16   Authorization Type Medicare part A & B   Authorization Time Period 08/27/2016 to 22-Oct-2016 (G-codes done 15th visit) NEW: 10/23/16 to 11/24/15   Authorization - Visit Number 18   Authorization - Number of Visits 27   PT Start Time 0962   PT Stop Time 1523  last 8 min on [employer] unsupervised   PT Time Calculation (min) 55 min   Equipment Utilized During Treatment Gait belt   Activity Tolerance Patient tolerated treatment well;No increased pain;Patient limited by fatigue   Behavior During Therapy [facility] for tasks assessed/performed      Past Medical History:  Diagnosis Date  . Blood in urine    Sees urology yearly  . Deafness in right ear age 53  . Hyperlipidemia     Past Surgical History:  Procedure Laterality Date  . BACK SURGERY  2010  . CATARACT EXTRACTION Bilateral 07/30/14, 08/13/2014  . CERVICAL SPINE SURGERY  09/01/15   C 4-5 , [place]  . COLONOSCOPY  12/19/2007   RMR: 1. External hemorrohids, otherwise normal rectum.2. Normal colon. 3. Normal terminal ileum.  . COLONOSCOPY N/A 12/13/2014   Procedure: COLONOSCOPY;  Surgeon: Daneil Dolin, MD;  [facility];  Service: Endoscopy;  Laterality: N/A;  11:15 Pt Request Time  . KNEE SURGERY Left    around 2000, arthroscopic    There were no vitals filed for this visit.      Subjective Assessment - 10/29/16 1428    Subjective Pt stated his Rt thigh is feeling better today, current pain scale 2/10 with weight bearing.   Pertinent History L4/L5 surgery, Cx surgery, HLD    Patient Stated Goals improve strength and balance    Currently in Pain? Yes   Pain Score 2    Pain Location Leg   Pain Orientation Right;Proximal;Anterior   Pain Descriptors / Indicators Aching   Pain Type Chronic pain   Pain Onset More than a month ago   Pain Frequency Constant   Aggravating Factors  weight bearing   Pain Relieving Factors sitting   Effect of Pain on Daily Activities limited weight bearing activites                         [facility] Adult PT Treatment/Exercise - 10/29/16 0001      Knee/Hip Exercises: Stretches   Active Hamstring Stretch Both;30 seconds;2 reps   Active Hamstring Stretch Limitations supine with rope   Quad Stretch 30 seconds;Right;3 reps   Quad Stretch Limitations prone with rope   Hip Flexor Stretch Both;2 reps;30 seconds   Hip Flexor Stretch Limitations on 12in step   Gastroc Stretch Both;2 reps;30 seconds   Gastroc Stretch Limitations slantboard      Knee/Hip Exercises: Aerobic   [place] L3 at EOS (not included in billing)     Knee/Hip Exercises: Prone   Straight Leg Raises 1 set;10 reps;Both   Straight Leg Raises Limitations opposite arm/leg; c/o increased LBP pillow placed under hips then  removed UE for pain control             Balance Exercises - 10/29/16 1516      Balance Exercises: Standing   SLS 2 reps;Eyes open  Lt 21", Rt 26" wiht min A for safety/ knees buckle=   Sidestepping Foam/compliant support;2 reps  balance beam sidestep wtih intermittent HHA   Numbers 1-15 Foam/compliant surface;2 reps  remove numbers then replace    Cone Rotation Foam/compliant surface;Intermittent upper extremity assist   Cone Rotation Limitations on balance beam   Lift / [place] Limitations lifting numbers up off floor with SBA/min guard             PT Short Term Goals - 10/12/16 1334      PT SHORT TERM GOAL #1   Title Pt will demo consistency and independence with his HEP to improve strength and mobility.    Time  2   Period Weeks   Status On-going     PT SHORT TERM GOAL #2   Title Pt will demo improved hamstring length to lacking no more than 25 deg BLE, to improve sitting posture.   Time 4   Period Weeks   Status New     PT SHORT TERM GOAL #3   Title Pt will demo correct log roll technique without cues from therapist atleast 3 trials, to decrease strain on his back during transitions in/out of bed.    Time 3   Period Weeks   Status Partially Met           PT Long Term Goals - 10/12/16 1335      PT LONG TERM GOAL #1   Title Pt will demo improved BLE strength to atleast 4+/5 MMT, to increase his safety with functional activity.    Time 8   Period Weeks   Status New     PT LONG TERM GOAL #2   Title Pt will perform 5x sit to stand without UE support in less than 12 sec, to improve his safety/independence getting in and out of chairs at home.    Baseline 14.7 sec, no UE   Time 8   Period Weeks   Status Partially Met     PT LONG TERM GOAL #3   Title Pt will perform TUG in less than 13 sec, with LRAD to indicate he is at a decreased risk of falling in the community.    Baseline 13.5 sec using rollator   Time 8   Period Weeks   Status Achieved     PT LONG TERM GOAL #4   Title Pt will perform SLS on each LE up to 10 sec without LOB, 3/5 trials, to decrease his risk of falling during ambulation/stair negotiation.    Baseline 1/5 trials   Time 8   Period Weeks   Status Partially Met               Plan - 10/29/16 1808    Clinical Impression Statement Continued with stretches to improve BLE flexibilty to reduce pain and improve mobiltiy wtih gait.  Main focus on treatment with balance activties to reduce risk of fallls.  Pt stated he feels as though he leans forward when losing balance, added number reaching to improve reaching outside BOS as well as rotational reaching and dynamic surfaces for increase difficutly.  Pt making good progress noted by improvements with SLS today.   Pt limited by fatigue, no reports of pain.     Rehab Potential  Good   PT Frequency 2x / week   PT Duration 8 weeks   PT Treatment/Interventions ADLs/Self Care Home Management;Aquatic Therapy;Moist Heat;Therapeutic exercise;Therapeutic activities;Functional mobility training;Stair training;Gait training;Balance training;Neuromuscular re-education;Patient/family education;Orthotic Fit/Training;Manual techniques;Passive range of motion   PT Next Visit Plan trunk strengthening in sitting; hip strength progressions; hamstring/hip flexor flexibility; balance (single leg stance and reaching activity)       Patient will benefit from skilled therapeutic intervention in order to improve the following deficits and impairments:  Abnormal gait, Decreased activity tolerance, Decreased safety awareness, Decreased strength, Impaired flexibility, Postural dysfunction, Pain, Improper body mechanics, Decreased range of motion, Decreased balance, Decreased endurance, Decreased mobility, Difficulty walking  Visit Diagnosis: Unsteadiness on feet  Muscle weakness (generalized)  Other abnormalities of gait and mobility     Problem List Patient Active Problem List   Diagnosis Date Noted  . Insomnia 07/20/2016  . Restless legs 07/20/2016  . Spinal stenosis in cervical region 10/14/2015  . Senile purpura ([place]) 04/18/2015  . Osteopenia 01/28/2015  . Spinal stenosis of lumbar region 01/20/2015  . BPH (benign prostatic hyperplasia) 01/20/2015  . Diverticulosis of colon without hemorrhage   . Encounter for screening colonoscopy 11/21/2014  . HTN (hypertension), benign 04/17/2014  . Temporal arteritis [facility]) 04/17/2014   Ihor Austin, LPTA; [place]  Aldona Lento 10/29/2016, 6:17 PM  [place] [address] [place], [place], [address] Phone: [phone]   Fax:  [phone]  Name: Gerald Hall MRN: [id number] Date of Birth:  23-Jul-1935

## 2016-11-02 ENCOUNTER — Ambulatory Visit (HOSPITAL_COMMUNITY): Payer: Medicare Other | Admitting: Physical Therapy

## 2016-11-02 DIAGNOSIS — M6281 Muscle weakness (generalized): Secondary | ICD-10-CM

## 2016-11-02 DIAGNOSIS — R2689 Other abnormalities of gait and mobility: Secondary | ICD-10-CM | POA: Diagnosis not present

## 2016-11-02 DIAGNOSIS — R2681 Unsteadiness on feet: Secondary | ICD-10-CM | POA: Diagnosis not present

## 2016-11-02 NOTE — Therapy (Signed)
[place], [place], [address] Phone: [phone]   Fax:  [phone]  Physical Therapy Treatment  Patient Details  Name: Gerald Hall MRN: [id number] Date of Birth: [birth year] Referring Provider: Sallee Lange, MD  Encounter Date: 11/02/2016      PT End of Session - 11/02/16 1550    Visit Number 19   Number of Visits 27   Date for PT Re-Evaluation 11/23/16   Authorization Type Medicare part A & B   Authorization Time Period 09/15/2016 to 2016-11-10 (G-codes done 15th visit) NEW: 2016-11-11 to 11/24/15   Authorization - Visit Number 19   Authorization - Number of Visits 27   PT Start Time 6812   PT Stop Time 1428   PT Time Calculation (min) 40 min   Equipment Utilized During Treatment Gait belt   Activity Tolerance Patient tolerated treatment well;No increased pain;Patient limited by fatigue   Behavior During Therapy [facility] for tasks assessed/performed      Past Medical History:  Diagnosis Date  . Blood in urine    Sees urology yearly  . Deafness in right ear age 59  . Hyperlipidemia     Past Surgical History:  Procedure Laterality Date  . BACK SURGERY  2010  . CATARACT EXTRACTION Bilateral 07/30/14, 08/13/2014  . CERVICAL SPINE SURGERY  09/01/15   C 4-5 , [place]  . COLONOSCOPY  12/19/2007   RMR: 1. External hemorrohids, otherwise normal rectum.2. Normal colon. 3. Normal terminal ileum.  . COLONOSCOPY N/A 12/13/2014   Procedure: COLONOSCOPY;  Surgeon: Daneil Dolin, MD;  [facility];  Service: Endoscopy;  Laterality: N/A;  11:15 Pt Request Time  . KNEE SURGERY Left    around 2000, arthroscopic    There were no vitals filed for this visit.      Subjective Assessment - 11/02/16 1355    Subjective Pt states things are about the same. His thigh isn't bothering him right now but when he is standing on it, it increases to about a 3/10   Pertinent History L4/L5 surgery, Cx surgery,  HLD   Patient Stated Goals improve strength and balance    Currently in Pain? Yes  3/10 pain Rt anterior thigh   Pain Onset More than a month ago                         [facility] Adult PT Treatment/Exercise - 11/02/16 0001      Knee/Hip Exercises: Seated   Other Seated Knee/Hip Exercises trunk rotation Lt/Rt with blue TB 2x15 reps each             Balance Exercises - 11/02/16 1405      Balance Exercises: Standing   Standing Eyes Opened Narrow base of support (BOS);Foam/compliant surface;Other (comment)  trunk rotation Lt/Rt x2 sets of 10 reps    Other Standing Exercises Alt LE tap on 4" step 3x10 reps;  cone reaching forward Lt/Rt with BUE 2x10 reps      Single leg stance with 1 LE propped on 12" box, 3x20 sec each, firm surface Attempted side stepping however this increased pt's Rt knee pain.        PT Education - 11/02/16 1550    Education provided Yes   Education Details noted improvements with several balance activities   Person(s) Educated Patient   Methods Explanation   Comprehension Verbalized understanding  PT Short Term Goals - 10/12/16 1334      PT SHORT TERM GOAL #1   Title Pt will demo consistency and independence with his HEP to improve strength and mobility.    Time 2   Period Weeks   Status On-going     PT SHORT TERM GOAL #2   Title Pt will demo improved hamstring length to lacking no more than 25 deg BLE, to improve sitting posture.   Time 4   Period Weeks   Status New     PT SHORT TERM GOAL #3   Title Pt will demo correct log roll technique without cues from therapist atleast 3 trials, to decrease strain on his back during transitions in/out of bed.    Time 3   Period Weeks   Status Partially Met           PT Long Term Goals - 10/12/16 1335      PT LONG TERM GOAL #1   Title Pt will demo improved BLE strength to atleast 4+/5 MMT, to increase his safety with functional activity.    Time 8   Period Weeks    Status New     PT LONG TERM GOAL #2   Title Pt will perform 5x sit to stand without UE support in less than 12 sec, to improve his safety/independence getting in and out of chairs at home.    Baseline 14.7 sec, no UE   Time 8   Period Weeks   Status Partially Met     PT LONG TERM GOAL #3   Title Pt will perform TUG in less than 13 sec, with LRAD to indicate he is at a decreased risk of falling in the community.    Baseline 13.5 sec using rollator   Time 8   Period Weeks   Status Achieved     PT LONG TERM GOAL #4   Title Pt will perform SLS on each LE up to 10 sec without LOB, 3/5 trials, to decrease his risk of falling during ambulation/stair negotiation.    Baseline 1/5 trials   Time 8   Period Weeks   Status Partially Met               Plan - 11/02/16 1551    Clinical Impression Statement Today's session continued with activity to improve core strength and standing balance. Pt demonstrated improvements with single leg balance with contralateral LE propped without UE assistance compared to previous sessions. He was able to perform all exercises without significant increase in knee pain, however this continues to be an issue of his with weight bearing activity, likely secondary to osteoarthritis. Will continue to monitor this throughout his sessions. No changes made to his HEP as pt continues to perform with appropriate levels of difficulty.   Rehab Potential Good   PT Frequency 2x / week   PT Duration 8 weeks   PT Treatment/Interventions ADLs/Self Care Home Management;Aquatic Therapy;Moist Heat;Therapeutic exercise;Therapeutic activities;Functional mobility training;Stair training;Gait training;Balance training;Neuromuscular re-education;Patient/family education;Orthotic Fit/Training;Manual techniques;Passive range of motion   PT Next Visit Plan trunk strengthening in sitting; hip strength progressions; hamstring/hip flexor flexibility; balance (single leg stance and reaching  activity)    PT Home Exercise Plan walking 3 min/day; supine ab set, bridge, clamshell with blue TB, supine hip flexor stretch, standing calf raises x20 reps    Recommended Other Services none    Consulted and Agree with Plan of Care Patient      Patient will benefit from  skilled therapeutic intervention in order to improve the following deficits and impairments:  Abnormal gait, Decreased activity tolerance, Decreased safety awareness, Decreased strength, Impaired flexibility, Postural dysfunction, Pain, Improper body mechanics, Decreased range of motion, Decreased balance, Decreased endurance, Decreased mobility, Difficulty walking  Visit Diagnosis: Unsteadiness on feet  Muscle weakness (generalized)  Other abnormalities of gait and mobility     Problem List Patient Active Problem List   Diagnosis Date Noted  . Insomnia 07/20/2016  . Restless legs 07/20/2016  . Spinal stenosis in cervical region 10/14/2015  . Senile purpura ([place]) 04/18/2015  . Osteopenia 01/28/2015  . Spinal stenosis of lumbar region 01/20/2015  . BPH (benign prostatic hyperplasia) 01/20/2015  . Diverticulosis of colon without hemorrhage   . Encounter for screening colonoscopy 11/21/2014  . HTN (hypertension), benign 04/17/2014  . Temporal arteritis ([place]) 04/17/2014   3:58 PM,11/02/16 Elly Modena PT, DPT [facility] [place] [address] [place], [place], [address] Phone: [phone]   Fax:  [phone]  Name: Gerald Hall MRN: [id number] Date of Birth: 1935/02/09

## 2016-11-04 ENCOUNTER — Ambulatory Visit (HOSPITAL_COMMUNITY): Payer: Medicare Other | Admitting: Physical Therapy

## 2016-11-04 DIAGNOSIS — M6281 Muscle weakness (generalized): Secondary | ICD-10-CM

## 2016-11-04 DIAGNOSIS — R2689 Other abnormalities of gait and mobility: Secondary | ICD-10-CM | POA: Diagnosis not present

## 2016-11-04 DIAGNOSIS — R2681 Unsteadiness on feet: Secondary | ICD-10-CM

## 2016-11-04 NOTE — Therapy (Signed)
[place], [place], [address] Phone: [phone]   Fax:  [phone]  Physical Therapy Treatment  Patient Details  Name: Gerald Hall MRN: [id number] Date of Birth: [birth year] Referring Provider: Sallee Lange, MD  Encounter Date: 11/04/2016      PT End of Session - 11/04/16 1448    Visit Number 20   Number of Visits 27   Date for PT Re-Evaluation 11/23/16   Authorization Type Medicare part A & B   Authorization Time Period 09/07/16 to 2016-11-02 (G-codes done 15th visit) NEW: 2016/11/03 to 11/24/15   Authorization - Visit Number 20   Authorization - Number of Visits 27   PT Start Time 1657   PT Stop Time 1430   PT Time Calculation (min) 42 min   Equipment Utilized During Treatment Gait belt   Activity Tolerance Patient tolerated treatment well;No increased pain;Patient limited by fatigue   Behavior During Therapy [facility] for tasks assessed/performed      Past Medical History:  Diagnosis Date  . Blood in urine    Sees urology yearly  . Deafness in right ear age 82  . Hyperlipidemia     Past Surgical History:  Procedure Laterality Date  . BACK SURGERY  2010  . CATARACT EXTRACTION Bilateral 07/30/14, 08/13/2014  . CERVICAL SPINE SURGERY  09/01/15   C 4-5 , [place]  . COLONOSCOPY  12/19/2007   RMR: 1. External hemorrohids, otherwise normal rectum.2. Normal colon. 3. Normal terminal ileum.  . COLONOSCOPY N/A 12/13/2014   Procedure: COLONOSCOPY;  Surgeon: Daneil Dolin, MD;  [facility];  Service: Endoscopy;  Laterality: N/A;  11:15 Pt Request Time  . KNEE SURGERY Left    around 2000, arthroscopic    There were no vitals filed for this visit.      Subjective Assessment - 11/04/16 1349    Subjective Pt reports that things are going well. His low back is a little sore today, possibly from the cone reaches performed during his last session. He has minimal pain in his knee currently.    Pertinent History L4/L5 surgery, Cx surgery, HLD   Patient Stated Goals improve strength and balance    Currently in Pain? --  just soreness in his low back   Pain Onset --                         [facility] Adult PT Treatment/Exercise - 11/04/16 0001      Knee/Hip Exercises: Aerobic   Nustep ended session on Nustep, untimed x7 min, pt adjusting resistance throughout      Knee/Hip Exercises: Supine   Other Supine Knee/Hip Exercises clamshells with blue TB 2x15 reps      Knee/Hip Exercises: Prone   Hamstring Curl 1 set;10 reps   Hamstring Curl Limitations blue TB     Manual Therapy   Manual Therapy Passive ROM   Manual therapy comments separate rest of session   Passive ROM Quad stretch 5x15 sec, BLE             Balance Exercises - 11/04/16 1457      Balance Exercises: Standing   Standing Eyes Opened Narrow base of support (BOS);Foam/compliant surface;Other (comment)  trunk rotation Lt/Rt   Tandem Stance 2 reps   SLS Eyes open;Solid surface;3 reps;30 secs;Other (comment)  contralateral LE on 12" step, encouraged to step without UE    Other Standing Exercises LOB recovery  x10 reps each direction  against wall corners            PT Education - 11/04/16 1446    Education provided Yes   Education Details discussed ways to incorporate safe balance challenges into his daily activity.   Person(s) Educated Patient   Methods Explanation   Comprehension Verbalized understanding          PT Short Term Goals - 10/12/16 1334      PT SHORT TERM GOAL #1   Title Pt will demo consistency and independence with his HEP to improve strength and mobility.    Time 2   Period Weeks   Status On-going     PT SHORT TERM GOAL #2   Title Pt will demo improved hamstring length to lacking no more than 25 deg BLE, to improve sitting posture.   Time 4   Period Weeks   Status New     PT SHORT TERM GOAL #3   Title Pt will demo correct log roll technique without  cues from therapist atleast 3 trials, to decrease strain on his back during transitions in/out of bed.    Time 3   Period Weeks   Status Partially Met           PT Long Term Goals - 10/12/16 1335      PT LONG TERM GOAL #1   Title Pt will demo improved BLE strength to atleast 4+/5 MMT, to increase his safety with functional activity.    Time 8   Period Weeks   Status New     PT LONG TERM GOAL #2   Title Pt will perform 5x sit to stand without UE support in less than 12 sec, to improve his safety/independence getting in and out of chairs at home.    Baseline 14.7 sec, no UE   Time 8   Period Weeks   Status Partially Met     PT LONG TERM GOAL #3   Title Pt will perform TUG in less than 13 sec, with LRAD to indicate he is at a decreased risk of falling in the community.    Baseline 13.5 sec using rollator   Time 8   Period Weeks   Status Achieved     PT LONG TERM GOAL #4   Title Pt will perform SLS on each LE up to 10 sec without LOB, 3/5 trials, to decrease his risk of falling during ambulation/stair negotiation.    Baseline 1/5 trials   Time 8   Period Weeks   Status Partially Met               Plan - 11/04/16 1449    Clinical Impression Statement Continued this visit with LE strengthening activity in a gravity eliminated position, to decrease stresses to pt's Rt hip/knee. Also performed balance activity, noting some improvements with some of these exercises. His balance recovery strategies have seemingly improved, noting he was able to react quicker with Rt steppage strategies rather than relying on his UE for support on the walls. Will continue with current POC.   Rehab Potential Good   PT Frequency 2x / week   PT Duration 8 weeks   PT Treatment/Interventions ADLs/Self Care Home Management;Aquatic Therapy;Moist Heat;Therapeutic exercise;Therapeutic activities;Functional mobility training;Stair training;Gait training;Balance training;Neuromuscular  re-education;Patient/family education;Orthotic Fit/Training;Manual techniques;Passive range of motion   PT Next Visit Plan hamstring/hip flexor flexibility; hip strength preferably in gravity eliminated positions; balance, introduce dynamic gait activity   PT Home Exercise Plan walking  3 min/day; supine ab set, bridge, clamshell with blue TB, supine hip flexor stretch, standing calf raises x20 reps    Consulted and Agree with Plan of Care Patient      Patient will benefit from skilled therapeutic intervention in order to improve the following deficits and impairments:  Abnormal gait, Decreased activity tolerance, Decreased safety awareness, Decreased strength, Impaired flexibility, Postural dysfunction, Pain, Improper body mechanics, Decreased range of motion, Decreased balance, Decreased endurance, Decreased mobility, Difficulty walking  Visit Diagnosis: Unsteadiness on feet  Muscle weakness (generalized)  Other abnormalities of gait and mobility     Problem List Patient Active Problem List   Diagnosis Date Noted  . Insomnia 07/20/2016  . Restless legs 07/20/2016  . Spinal stenosis in cervical region 10/14/2015  . Senile purpura ([place]) 04/18/2015  . Osteopenia 01/28/2015  . Spinal stenosis of lumbar region 01/20/2015  . BPH (benign prostatic hyperplasia) 01/20/2015  . Diverticulosis of colon without hemorrhage   . Encounter for screening colonoscopy 11/21/2014  . HTN (hypertension), benign 04/17/2014  . Temporal arteritis ([place]) 04/17/2014    2:58 PM,11/04/16 Elly Modena PT, DPT [facility] [place] [address] [place], [place], [address] Phone: [phone]   Fax:  [phone]  Name: JABES PRIMO MRN: [id number] Date of Birth: [birth year]

## 2016-11-05 DIAGNOSIS — H3589 Other specified retinal disorders: Secondary | ICD-10-CM | POA: Diagnosis not present

## 2016-11-05 DIAGNOSIS — H3554 Dystrophies primarily involving the retinal pigment epithelium: Secondary | ICD-10-CM | POA: Diagnosis not present

## 2016-11-05 DIAGNOSIS — H04123 Dry eye syndrome of bilateral lacrimal glands: Secondary | ICD-10-CM | POA: Diagnosis not present

## 2016-11-09 ENCOUNTER — Ambulatory Visit (HOSPITAL_COMMUNITY): Payer: Medicare Other | Admitting: Physical Therapy

## 2016-11-09 DIAGNOSIS — R2681 Unsteadiness on feet: Secondary | ICD-10-CM

## 2016-11-09 DIAGNOSIS — M6281 Muscle weakness (generalized): Secondary | ICD-10-CM | POA: Diagnosis not present

## 2016-11-09 DIAGNOSIS — R2689 Other abnormalities of gait and mobility: Secondary | ICD-10-CM

## 2016-11-09 NOTE — Therapy (Signed)
[place], [place], [address] Phone: [phone]   Fax:  [phone]  Physical Therapy Treatment  Patient Details  Name: Gerald Hall MRN: [id number] Date of Birth: July 03, 1935 Referring Provider: Sallee Lange, MD  Encounter Date: 11/09/2016      PT End of Session - 11/09/16 1429    Visit Number 21   Number of Visits 27   Date for PT Re-Evaluation 11/23/16   Authorization Type Medicare part A & B   Authorization Time Period September 03, 2016 to 10/29/16 (G-codes done 15th visit) NEW: October 30, 2016 to 11/24/15   Authorization - Visit Number 21   Authorization - Number of Visits 27   PT Start Time 1093   PT Stop Time 1429   PT Time Calculation (min) 42 min   Equipment Utilized During Treatment Gait belt   Activity Tolerance Patient tolerated treatment well;No increased pain;Patient limited by fatigue   Behavior During Therapy [facility] for tasks assessed/performed      Past Medical History:  Diagnosis Date  . Blood in urine    Sees urology yearly  . Deafness in right ear age 28  . Hyperlipidemia     Past Surgical History:  Procedure Laterality Date  . BACK SURGERY  2010  . CATARACT EXTRACTION Bilateral 07/30/14, 08/13/2014  . CERVICAL SPINE SURGERY  09/01/15   C 4-5 , [place]  . COLONOSCOPY  12/19/2007   RMR: 1. External hemorrohids, otherwise normal rectum.2. Normal colon. 3. Normal terminal ileum.  . COLONOSCOPY N/A 12/13/2014   Procedure: COLONOSCOPY;  Surgeon: Daneil Dolin, MD;  [facility];  Service: Endoscopy;  Laterality: N/A;  11:15 Pt Request Time  . KNEE SURGERY Left    around 2000, arthroscopic    There were no vitals filed for this visit.      Subjective Assessment - 11/09/16 1348    Subjective Pt reports he just saw the opthamologist and is going to go get new glasses this evening. He has been doing his exercises at home and feels that his legs are not quite as sore today as  they have been in the past.    Pertinent History L4/L5 surgery, Cx surgery, HLD   Patient Stated Goals improve strength and balance    Currently in Pain? No/denies                         [facility] Adult PT Treatment/Exercise - 11/09/16 0001      Exercises   Exercises Ankle     Knee/Hip Exercises: Stretches   Hip Flexor Stretch Both;3 reps;30 seconds   Hip Flexor Stretch Limitations supine   X2 sets on Lt      Knee/Hip Exercises: Supine   Other Supine Knee/Hip Exercises Bent knee lower at 90/90, x10 reps    Other Supine Knee/Hip Exercises Ab set with single knee flexion/extension 2x10 each      Knee/Hip Exercises: Sidelying   Clams 2x15 each with double blue TB     Knee/Hip Exercises: Prone   Hamstring Curl 1 set;10 reps   Hamstring Curl Limitations double blue TB             Balance Exercises - 11/09/16 1429      Balance Exercises: Standing   Tandem Stance 2 reps;Eyes open;Foam/compliant surface;30 secs   Other Standing Exercises Alt LE taps on 12" box, 4x30 sec each  PT Education - 11/09/16 1510    Education provided Yes   Education Details importance of ankle strength in improving balance reactions, trunk activation to improve steadiness with balance activity.    Person(s) Educated Patient   Methods Explanation;Demonstration   Comprehension Verbalized understanding;Returned demonstration          PT Short Term Goals - 10/12/16 1334      PT SHORT TERM GOAL #1   Title Pt will demo consistency and independence with his HEP to improve strength and mobility.    Time 2   Period Weeks   Status On-going     PT SHORT TERM GOAL #2   Title Pt will demo improved hamstring length to lacking no more than 25 deg BLE, to improve sitting posture.   Time 4   Period Weeks   Status New     PT SHORT TERM GOAL #3   Title Pt will demo correct log roll technique without cues from therapist atleast 3 trials, to decrease strain on his back during  transitions in/out of bed.    Time 3   Period Weeks   Status Partially Met           PT Long Term Goals - 10/12/16 1335      PT LONG TERM GOAL #1   Title Pt will demo improved BLE strength to atleast 4+/5 MMT, to increase his safety with functional activity.    Time 8   Period Weeks   Status New     PT LONG TERM GOAL #2   Title Pt will perform 5x sit to stand without UE support in less than 12 sec, to improve his safety/independence getting in and out of chairs at home.    Baseline 14.7 sec, no UE   Time 8   Period Weeks   Status Partially Met     PT LONG TERM GOAL #3   Title Pt will perform TUG in less than 13 sec, with LRAD to indicate he is at a decreased risk of falling in the community.    Baseline 13.5 sec using rollator   Time 8   Period Weeks   Status Achieved     PT LONG TERM GOAL #4   Title Pt will perform SLS on each LE up to 10 sec without LOB, 3/5 trials, to decrease his risk of falling during ambulation/stair negotiation.    Baseline 1/5 trials   Time 8   Period Weeks   Status Partially Met               Plan - 11/09/16 1511    Clinical Impression Statement Pt is making progress towards goals with improving pain report and static balance. Today's session focused more on hip flexibility and strength with the addition of ankle strengthening for carry over into balance reactions. He was able to perform LE taps on a 12" box with decreasing levels of assistance from previous sessions, and this was further improved once he was instructed to activate his trunk musculature. Will continue with current POC.   Rehab Potential Good   PT Frequency 2x / week   PT Duration 8 weeks   PT Treatment/Interventions ADLs/Self Care Home Management;Aquatic Therapy;Moist Heat;Therapeutic exercise;Therapeutic activities;Functional mobility training;Stair training;Gait training;Balance training;Neuromuscular re-education;Patient/family education;Orthotic Fit/Training;Manual  techniques;Passive range of motion   PT Next Visit Plan ankle strengthening, balance activity progression to dynamic activity   PT Home Exercise Plan walking 3 min/day; supine ab set, bridge, clamshell with blue TB, supine hip  flexor stretch, standing calf raises x20 reps    Consulted and Agree with Plan of Care Patient      Patient will benefit from skilled therapeutic intervention in order to improve the following deficits and impairments:  Abnormal gait, Decreased activity tolerance, Decreased safety awareness, Decreased strength, Impaired flexibility, Postural dysfunction, Pain, Improper body mechanics, Decreased range of motion, Decreased balance, Decreased endurance, Decreased mobility, Difficulty walking  Visit Diagnosis: Unsteadiness on feet  Muscle weakness (generalized)  Other abnormalities of gait and mobility       G-Codes - Nov 24, 2016 1517    Functional Assessment Tool Used Clinical judgement based on assessment of balance, strength and functional mobility   Functional Limitation Mobility: Walking and moving around   Mobility: Walking and Moving Around Current Status ([id number]) At least 40 percent but less than 60 percent impaired, limited or restricted   Mobility: Walking and Moving Around Goal Status [phone]) At least 40 percent but less than 60 percent impaired, limited or restricted      Problem List Patient Active Problem List   Diagnosis Date Noted  . Insomnia 07/20/2016  . Restless legs 07/20/2016  . Spinal stenosis in cervical region 10/14/2015  . Senile purpura ([place]) 04/18/2015  . Osteopenia 01/28/2015  . Spinal stenosis of lumbar region 01/20/2015  . BPH (benign prostatic hyperplasia) 01/20/2015  . Diverticulosis of colon without hemorrhage   . Encounter for screening colonoscopy 11/21/2014  . HTN (hypertension), benign 04/17/2014  . Temporal arteritis ([place]) 04/17/2014    3:31 PM,Nov 24, 2016 Elly Modena PT, DPT [facility] [place] [address] [place], [place], [address] Phone: [phone]   Fax:  [phone]  Name: Gerald Hall MRN: [id number] Date of Birth: 1935-04-19

## 2016-11-10 ENCOUNTER — Encounter: Payer: Self-pay | Admitting: Family Medicine

## 2016-11-10 DIAGNOSIS — H3530 Unspecified macular degeneration: Secondary | ICD-10-CM | POA: Insufficient documentation

## 2016-11-11 ENCOUNTER — Encounter (HOSPITAL_COMMUNITY): Payer: Medicare Other | Admitting: Physical Therapy

## 2016-11-16 ENCOUNTER — Ambulatory Visit (HOSPITAL_COMMUNITY): Payer: Medicare Other | Admitting: Physical Therapy

## 2016-11-16 DIAGNOSIS — M6281 Muscle weakness (generalized): Secondary | ICD-10-CM | POA: Diagnosis not present

## 2016-11-16 DIAGNOSIS — R2681 Unsteadiness on feet: Secondary | ICD-10-CM

## 2016-11-16 DIAGNOSIS — R2689 Other abnormalities of gait and mobility: Secondary | ICD-10-CM | POA: Diagnosis not present

## 2016-11-16 NOTE — Therapy (Signed)
[place], [place], [address] Phone: [phone]   Fax:  [phone]  Physical Therapy Treatment  Patient Details  Name: Gerald Hall MRN: [id number] Date of Birth: 07-18-1935 Referring Provider: Sallee Lange, MD  Encounter Date: 11/16/2016      PT End of Session - 11/16/16 1622    Visit Number 22   Number of Visits 27   Date for PT Re-Evaluation 11/23/16   Authorization Type Medicare part A & B   Authorization Time Period 2016/09/09 to 2016-11-04 (G-codes done 15th visit) NEW: 2016/11/05 to 11/24/15   Authorization - Visit Number 22   Authorization - Number of Visits 27   PT Start Time 3007   PT Stop Time 1425   PT Time Calculation (min) 38 min   Equipment Utilized During Treatment Gait belt   Activity Tolerance Patient tolerated treatment well;No increased pain;Patient limited by fatigue   Behavior During Therapy [facility] for tasks assessed/performed      Past Medical History:  Diagnosis Date  . Blood in urine    Sees urology yearly  . Deafness in right ear age 82  . Hyperlipidemia     Past Surgical History:  Procedure Laterality Date  . BACK SURGERY  2010  . CATARACT EXTRACTION Bilateral 07/30/14, 08/13/2014  . CERVICAL SPINE SURGERY  09/01/15   C 4-5 , [place]  . COLONOSCOPY  12/19/2007   RMR: 1. External hemorrohids, otherwise normal rectum.2. Normal colon. 3. Normal terminal ileum.  . COLONOSCOPY N/A 12/13/2014   Procedure: COLONOSCOPY;  Surgeon: Daneil Dolin, MD;  [facility];  Service: Endoscopy;  Laterality: N/A;  11:15 Pt Request Time  . KNEE SURGERY Left    around 2000, arthroscopic    There were no vitals filed for this visit.      Subjective Assessment - 11/16/16 1351    Subjective pt reports things are going ok. He walked for 8 minutes on Sunday and is now very sore. He does note this is improving from yesterday.    Pertinent History L4/L5 surgery, Cx surgery,  HLD   Patient Stated Goals improve strength and balance    Currently in Pain? Other (Comment)  BLE muscle soreness                         [facility] Adult PT Treatment/Exercise - 11/16/16 0001      Knee/Hip Exercises: Stretches   Hip Flexor Stretch Right;4 reps;30 seconds   Hip Flexor Stretch Limitations lunge stretch position in // bars      Ankle Exercises: Supine   T-Band B ankle DF/Inv/Ever x15 reps with blue TB     Ankle Exercises: Standing   Heel Raises 15 reps;Other (comment)  x2 sets each LE, single leg              Balance Exercises - 11/16/16 1634      Balance Exercises: Standing   Tandem Gait Forward;Intermittent upper extremity support;4 reps;Other (comment)  in // bars   Retro Gait 3 reps;Other (comment)  in // bars   Step Over Hurdles / Cones forward, using small hurdles, x4 RT           PT Education - 11/16/16 1619    Education provided Yes   Education Details noted improvements with dynamic balance activity; importance of slowly progressing endurance activity to avoid overuse and fatigue for several days following   Person(s) Educated  Patient   Methods Explanation;Demonstration   Comprehension Verbalized understanding;Returned demonstration          PT Short Term Goals - 10/12/16 1334      PT SHORT TERM GOAL #1   Title Pt will demo consistency and independence with his HEP to improve strength and mobility.    Time 2   Period Weeks   Status On-going     PT SHORT TERM GOAL #2   Title Pt will demo improved hamstring length to lacking no more than 25 deg BLE, to improve sitting posture.   Time 4   Period Weeks   Status New     PT SHORT TERM GOAL #3   Title Pt will demo correct log roll technique without cues from therapist atleast 3 trials, to decrease strain on his back during transitions in/out of bed.    Time 3   Period Weeks   Status Partially Met           PT Long Term Goals - 10/12/16 1335      PT LONG TERM  GOAL #1   Title Pt will demo improved BLE strength to atleast 4+/5 MMT, to increase his safety with functional activity.    Time 8   Period Weeks   Status New     PT LONG TERM GOAL #2   Title Pt will perform 5x sit to stand without UE support in less than 12 sec, to improve his safety/independence getting in and out of chairs at home.    Baseline 14.7 sec, no UE   Time 8   Period Weeks   Status Partially Met     PT LONG TERM GOAL #3   Title Pt will perform TUG in less than 13 sec, with LRAD to indicate he is at a decreased risk of falling in the community.    Baseline 13.5 sec using rollator   Time 8   Period Weeks   Status Achieved     PT LONG TERM GOAL #4   Title Pt will perform SLS on each LE up to 10 sec without LOB, 3/5 trials, to decrease his risk of falling during ambulation/stair negotiation.    Baseline 1/5 trials   Time 8   Period Weeks   Status Partially Met               Plan - 11/16/16 1622    Clinical Impression Statement Pt arrived today with increased soreness in his LE reported following increase in walking this weekend. Overall his endurance is improving and his soreness was relieved with a hip flexor stretch performed prior to his activity. He was able to perform walking activity such as tandem walk and stepping over hurdles with minimal use of UE support and without LOB. Ended the session with pt on the nustep to further address soreness following increase in activity.    Rehab Potential Good   PT Frequency 2x / week   PT Duration 8 weeks   PT Treatment/Interventions ADLs/Self Care Home Management;Aquatic Therapy;Moist Heat;Therapeutic exercise;Therapeutic activities;Functional mobility training;Stair training;Gait training;Balance training;Neuromuscular re-education;Patient/family education;Orthotic Fit/Training;Manual techniques;Passive range of motion   PT Next Visit Plan ankle PROM, strength with blue TB, step over hurdles   PT Home Exercise Plan  walking 3 min/day; supine ab set, bridge, clamshell with blue TB, supine hip flexor stretch, standing calf raises x20 reps    Consulted and Agree with Plan of Care Patient      Patient will benefit from skilled therapeutic intervention  in order to improve the following deficits and impairments:  Abnormal gait, Decreased activity tolerance, Decreased safety awareness, Decreased strength, Impaired flexibility, Postural dysfunction, Pain, Improper body mechanics, Decreased range of motion, Decreased balance, Decreased endurance, Decreased mobility, Difficulty walking  Visit Diagnosis: Unsteadiness on feet  Muscle weakness (generalized)  Other abnormalities of gait and mobility     Problem List Patient Active Problem List   Diagnosis Date Noted  . Macular degeneration 11/10/2016  . Insomnia 07/20/2016  . Restless legs 07/20/2016  . Spinal stenosis in cervical region 10/14/2015  . Senile purpura ([place]) 04/18/2015  . Osteopenia 01/28/2015  . Spinal stenosis of lumbar region 01/20/2015  . BPH (benign prostatic hyperplasia) 01/20/2015  . Diverticulosis of colon without hemorrhage   . Encounter for screening colonoscopy 11/21/2014  . HTN (hypertension), benign 04/17/2014  . Temporal arteritis ([place]) 04/17/2014    4:36 PM,11/16/16 Elly Modena PT, DPT [facility] [place] [address] [place], [place], [address] Phone: [phone]   Fax:  [phone]  Name: Gerald Hall MRN: [id number] Date of Birth: Jun 06, 1935

## 2016-11-18 ENCOUNTER — Ambulatory Visit (HOSPITAL_COMMUNITY): Payer: Medicare Other | Admitting: Physical Therapy

## 2016-11-18 DIAGNOSIS — R2681 Unsteadiness on feet: Secondary | ICD-10-CM | POA: Diagnosis not present

## 2016-11-18 DIAGNOSIS — R2689 Other abnormalities of gait and mobility: Secondary | ICD-10-CM | POA: Diagnosis not present

## 2016-11-18 DIAGNOSIS — M6281 Muscle weakness (generalized): Secondary | ICD-10-CM | POA: Diagnosis not present

## 2016-11-18 NOTE — Therapy (Signed)
[place], [place], [address] Phone: [phone]   Fax:  [phone]  Physical Therapy Treatment  Patient Details  Name: Gerald Hall MRN: [id number] Date of Birth: October 23, 1935 Referring Provider: Sallee Lange, MD  Encounter Date: 11/18/2016      PT End of Session - 11/18/16 1432    Visit Number 23   Number of Visits 27   Date for PT Re-Evaluation 11/23/16   Authorization Type Medicare part A & B   Authorization Time Period 09/09/16 to 2016-11-04 (G-codes done 15th visit) NEW: November 05, 2016 to 11/24/15   Authorization - Visit Number 23   Authorization - Number of Visits 27   PT Start Time 4166   PT Stop Time 1429   PT Time Calculation (min) 42 min   Equipment Utilized During Treatment Gait belt   Activity Tolerance Patient tolerated treatment well;No increased pain   Behavior During Therapy WFL for tasks assessed/performed      Past Medical History:  Diagnosis Date  . Blood in urine    Sees urology yearly  . Deafness in right ear age 52  . Hyperlipidemia     Past Surgical History:  Procedure Laterality Date  . BACK SURGERY  2010  . CATARACT EXTRACTION Bilateral 07/30/14, 08/13/2014  . CERVICAL SPINE SURGERY  09/01/15   C 4-5 , [place]  . COLONOSCOPY  12/19/2007   RMR: 1. External hemorrohids, otherwise normal rectum.2. Normal colon. 3. Normal terminal ileum.  . COLONOSCOPY N/A 12/13/2014   Procedure: COLONOSCOPY;  Surgeon: Daneil Dolin, MD;  [facility];  Service: Endoscopy;  Laterality: N/A;  11:15 Pt Request Time  . KNEE SURGERY Left    around 2000, arthroscopic    There were no vitals filed for this visit.      Subjective Assessment - 11/18/16 1350    Subjective Pt reports that most of his soreness has resolved. He has no other complaints at this time.    Pertinent History L4/L5 surgery, Cx surgery, HLD   Patient Stated Goals improve strength and balance    Currently  in Pain? No/denies                         [facility] Adult PT Treatment/Exercise - 11/18/16 0001      Knee/Hip Exercises: Stretches   Hip Flexor Stretch Right;3 reps;30 seconds   Hip Flexor Stretch Limitations forward lunge in // bars      Ankle Exercises: Seated   Other Seated Ankle Exercises DF/eversion with blue TB x20 reps each LE             Balance Exercises - 11/18/16 1405      Balance Exercises: Standing   Standing Eyes Opened Narrow base of support (BOS);Foam/compliant surface;Other (comment)  during ball toss activity x30 reps    Standing Eyes Closed Narrow base of support (BOS);Foam/compliant surface;2 reps;20 secs;Other (comment)  increased postural sway   Tandem Stance Eyes open;Other reps (comment)  each LE forward, ball toss in various directions x20 reps ea   Step Over Hurdles / Cones forward x4 RT, 6" hurdles; Sidestepping over 6" cones x2 RT   Other Standing Exercises SLS with contralateral LE on 12" step during cone reach across body x10 reps each, CGA           PT Education - 11/18/16 1431    Education provided Yes   Education Details importance of addressing  ankle strength as a contributor to balance   Person(s) Educated Patient   Methods Explanation   Comprehension Verbalized understanding          PT Short Term Goals - 10/12/16 1334      PT SHORT TERM GOAL #1   Title Pt will demo consistency and independence with his HEP to improve strength and mobility.    Time 2   Period Weeks   Status On-going     PT SHORT TERM GOAL #2   Title Pt will demo improved hamstring length to lacking no more than 25 deg BLE, to improve sitting posture.   Time 4   Period Weeks   Status New     PT SHORT TERM GOAL #3   Title Pt will demo correct log roll technique without cues from therapist atleast 3 trials, to decrease strain on his back during transitions in/out of bed.    Time 3   Period Weeks   Status Partially Met           PT  Long Term Goals - 10/12/16 1335      PT LONG TERM GOAL #1   Title Pt will demo improved BLE strength to atleast 4+/5 MMT, to increase his safety with functional activity.    Time 8   Period Weeks   Status New     PT LONG TERM GOAL #2   Title Pt will perform 5x sit to stand without UE support in less than 12 sec, to improve his safety/independence getting in and out of chairs at home.    Baseline 14.7 sec, no UE   Time 8   Period Weeks   Status Partially Met     PT LONG TERM GOAL #3   Title Pt will perform TUG in less than 13 sec, with LRAD to indicate he is at a decreased risk of falling in the community.    Baseline 13.5 sec using rollator   Time 8   Period Weeks   Status Achieved     PT LONG TERM GOAL #4   Title Pt will perform SLS on each LE up to 10 sec without LOB, 3/5 trials, to decrease his risk of falling during ambulation/stair negotiation.    Baseline 1/5 trials   Time 8   Period Weeks   Status Partially Met               Plan - 11/18/16 1433    Clinical Impression Statement Continued this session with ankle strengthening and dynamic balance activity to further improve pt's safety with activity. He had more episodes of unsteadiness with hurdle steps compared to his last session. This is likely only secondary to change in environment from the parallel bars to open floor. Overall, pt ended session being pleased with his performance.   Rehab Potential Good   PT Frequency 2x / week   PT Duration 8 weeks   PT Treatment/Interventions ADLs/Self Care Home Management;Aquatic Therapy;Moist Heat;Therapeutic exercise;Therapeutic activities;Functional mobility training;Stair training;Gait training;Balance training;Neuromuscular re-education;Patient/family education;Orthotic Fit/Training;Manual techniques;Passive range of motion   PT Next Visit Plan ankle dynamic balance: alt toe taps, SLS   PT Home Exercise Plan walking 3 min/day; supine ab set, bridge, clamshell with blue  TB, supine hip flexor stretch, standing calf raises x20 reps    Consulted and Agree with Plan of Care Patient      Patient will benefit from skilled therapeutic intervention in order to improve the following deficits and impairments:  Abnormal gait, Decreased  activity tolerance, Decreased safety awareness, Decreased strength, Impaired flexibility, Postural dysfunction, Pain, Improper body mechanics, Decreased range of motion, Decreased balance, Decreased endurance, Decreased mobility, Difficulty walking  Visit Diagnosis: Unsteadiness on feet  Muscle weakness (generalized)  Other abnormalities of gait and mobility     Problem Hall Patient Active Problem Hall   Diagnosis Date Noted  . Macular degeneration 11/10/2016  . Insomnia 07/20/2016  . Restless legs 07/20/2016  . Spinal stenosis in cervical region 10/14/2015  . Senile purpura ([place]) 04/18/2015  . Osteopenia 01/28/2015  . Spinal stenosis of lumbar region 01/20/2015  . BPH (benign prostatic hyperplasia) 01/20/2015  . Diverticulosis of colon without hemorrhage   . Encounter for screening colonoscopy 11/21/2014  . HTN (hypertension), benign 04/17/2014  . Temporal arteritis ([place]) 04/17/2014   2:40 PM,11/18/16 Elly Modena PT, DPT [facility] [place] [address] [place], [place], [address] Phone: [phone]   Fax:  [phone]  Name: Gerald Hall MRN: [id number] Date of Birth: [birth year]

## 2016-11-23 ENCOUNTER — Ambulatory Visit (HOSPITAL_COMMUNITY): Payer: Medicare Other | Admitting: Physical Therapy

## 2016-11-23 DIAGNOSIS — R2681 Unsteadiness on feet: Secondary | ICD-10-CM

## 2016-11-23 DIAGNOSIS — M6281 Muscle weakness (generalized): Secondary | ICD-10-CM | POA: Diagnosis not present

## 2016-11-23 DIAGNOSIS — R2689 Other abnormalities of gait and mobility: Secondary | ICD-10-CM

## 2016-11-23 NOTE — Therapy (Signed)
[place], [place], [address] Phone: [phone]   Fax:  [phone]  Physical Therapy Treatment/Discharge  Patient Details  Name: Gerald Hall MRN: [id number] Date of Birth: 1935/09/25 Referring Provider: Sallee Lange, MD   Encounter Date: 11/23/2016      PT End of Session - 11/23/16 1656    Visit Number 24   Number of Visits 27   Date for PT Re-Evaluation 11/23/16   Authorization Type Medicare part A & B   Authorization Time Period 09/16/16 to 2016/11/11 (G-codes done 15th visit) NEW: 11-12-16 to 11/24/15   Authorization - Visit Number 24   Authorization - Number of Visits 27   PT Start Time 1346   PT Stop Time 1429   PT Time Calculation (min) 43 min   Equipment Utilized During Treatment Gait belt   Activity Tolerance Patient tolerated treatment well;No increased pain   Behavior During Therapy WFL for tasks assessed/performed      Past Medical History:  Diagnosis Date  . Blood in urine    Sees urology yearly  . Deafness in right ear age 6  . Hyperlipidemia     Past Surgical History:  Procedure Laterality Date  . BACK SURGERY  2010  . CATARACT EXTRACTION Bilateral 07/30/14, 08/13/2014  . CERVICAL SPINE SURGERY  09/01/15   C 4-5 , [place]  . COLONOSCOPY  12/19/2007   RMR: 1. External hemorrohids, otherwise normal rectum.2. Normal colon. 3. Normal terminal ileum.  . COLONOSCOPY N/A 12/13/2014   Procedure: COLONOSCOPY;  Surgeon: Daneil Dolin, MD;  [facility];  Service: Endoscopy;  Laterality: N/A;  11:15 Pt Request Time  . KNEE SURGERY Left    around 2000, arthroscopic    There were no vitals filed for this visit.      Subjective Assessment - 11/23/16 1358    Subjective Pt reports Rt thigh soreness that continues to bother him some days worse than others. He feels that his balance has greatly improved.    Pertinent History L4/L5 surgery, Cx surgery, HLD   How long  can you sit comfortably? unlimited    How long can you stand comfortably? unsure, maybe 20-30 min   How long can you walk comfortably? unsure, currently walking up to 7 min consecutively at home.    Patient Stated Goals improve strength and balance    Currently in Pain? Yes   Pain Score 2    Pain Orientation Right  thigh   Pain Descriptors / Indicators Aching   Pain Type Chronic pain            [facility] PT Assessment - 11/23/16 0001      Assessment   Medical Diagnosis General weakness, unsteady gait    Referring Provider Sallee Lange, MD    Onset Date/Surgical Date --  approx 1 year ago   Next MD Visit "not for a while"    Prior Therapy HHPT for 8-10 visits     Precautions   Precautions None     Balance Screen   Has the patient fallen in the past 6 months No   Has the patient had a decrease in activity level because of a fear of falling?  Yes   Is the patient reluctant to leave their home because of a fear of falling?  No     Home Social worker Private residence   Living Arrangements Alone   Additional Comments ramp  Prior Function   Level of Independence Independent   Vocation Retired     Charity fundraiser Status Within Functional Limits for tasks assessed     Observation/Other Assessments   Focus on Therapeutic Outcomes (FOTO)  42% limited      Sensation   Light Touch Appears Intact     Posture/Postural Control   Posture/Postural Control Postural limitations   Postural Limitations Posterior pelvic tilt;Flexed trunk;Forward head;Rounded Shoulders     Strength   Right Hip Flexion 5/5   Right Hip Extension 4+/5   Right Hip ABduction 4/5   Left Hip Flexion 5/5   Left Hip Extension 4+/5   Left Hip ABduction 4/5   Right Knee Flexion 5/5   Right Knee Extension 5/5   Left Knee Flexion 5/5   Left Knee Extension 5/5   Right Ankle Dorsiflexion 5/5   Left Ankle Dorsiflexion 5/5     Flexibility   Soft Tissue Assessment  /Muscle Length yes   Hamstrings Rt: 30 deg, Lt: 35 deg      Transfers   Five time sit to stand comments  11.8 sec, No UE     Ambulation/Gait   Gait Comments forward flexed posture, rollator out in front     Standardized Balance Assessment   Standardized Balance Assessment Berg Balance Test     Berg Balance Test   Sit to Stand Able to stand without using hands and stabilize independently   Standing Unsupported Able to stand safely 2 minutes   Sitting with Back Unsupported but Feet Supported on Floor or Stool Able to sit safely and securely 2 minutes   Stand to Sit Sits safely with minimal use of hands   Transfers Able to transfer safely, minor use of hands   Standing Unsupported with Eyes Closed Able to stand 10 seconds with supervision  15-20 sec, CGA   Standing Ubsupported with Feet Together Able to place feet together independently and stand 1 minute safely   From Standing, Reach Forward with Outstretched Arm Can reach forward >12 cm safely (5")  8"    From Standing Position, Pick up Object from Floor Able to pick up shoe, needs supervision   From Standing Position, Turn to Look Behind Over each Shoulder Needs supervision when turning   Turn 360 Degrees Able to turn 360 degrees safely but slowly  Rt 8.5 sec, Lt 9.4 sec    Standing Unsupported, Alternately Place Feet on Step/Stool Able to stand independently and complete 8 steps >20 seconds  grabbed for support after 5 taps    Standing Unsupported, One Foot in Front Able to plae foot ahead of the other independently and hold 30 seconds  Lt forward 15 sec, Rt foward 32 sec (stepping indep)   Standing on One Leg Able to lift leg independently and hold 5-10 seconds  Lt: 3-7 sec ; Rt: 4-5 sec (2 trials)   Total Score 45     High Level Balance   High Level Balance Comments 13.5 sec, with rollator                              PT Education - 11/23/16 1654    Education provided Yes   Education Details  improvements made overall with balance/strength/mobility; importance of continued HEP adherence for further improvements; addition to HEP   Person(s) Educated Patient   Methods Explanation;Handout   Comprehension Verbalized understanding  PT Short Term Goals - 11/23/16 1702      PT SHORT TERM GOAL #1   Title Pt will demo consistency and independence with his HEP to improve strength and mobility.    Time 2   Period Weeks   Status Achieved     PT SHORT TERM GOAL #2   Title Pt will demo improved hamstring length to lacking no more than 25 deg BLE, to improve sitting posture.   Baseline ~30 deg    Time 4   Period Weeks   Status Partially Met     PT SHORT TERM GOAL #3   Title Pt will demo correct log roll technique without cues from therapist atleast 3 trials, to decrease strain on his back during transitions in/out of bed.    Time 3   Period Weeks   Status Achieved           PT Long Term Goals - 11/23/16 1702      PT LONG TERM GOAL #1   Title Pt will demo improved BLE strength to atleast 4+/5 MMT, to increase his safety with functional activity.    Time 8   Period Weeks   Status Achieved     PT LONG TERM GOAL #2   Title Pt will perform 5x sit to stand without UE support in less than 12 sec, to improve his safety/independence getting in and out of chairs at home.    Baseline 11 sec, no UE   Time 8   Period Weeks   Status Achieved     PT LONG TERM GOAL #3   Title Pt will perform TUG in less than 13 sec, with LRAD to indicate he is at a decreased risk of falling in the community.    Baseline 13.5 sec using rollator   Time 8   Period Weeks   Status Achieved     PT LONG TERM GOAL #4   Title Pt will perform SLS on each LE up to 10 sec without LOB, 3/5 trials, to decrease his risk of falling during ambulation/stair negotiation.    Baseline up to 5 sec on each   Time 8   Period Weeks   Status Partially Met               Plan - 11/23/16 1656     Clinical Impression Statement Pt was reassessed this visit demonstrating improvements in balance, strength and functional mobility. He has been walking almost daily and is now up to 7 minutes at a time before needing to take a rest break. He scored a 45/56 on the berg balance test placing him at a decreased risk of falling compared to his initial score of 25/56. His single leg balance has improved and his overall confidence with activity has improved evident by his diligence with his home program. At this time, his progress has begun to plateau since his last reassessment, and we discussed discharging from PT to allow him to continue with his exercises/activity at home. Pt is in agreement with this and is overall pleased with the amount of progress he has made. He will be discharged at this time with almost all goals met.    Rehab Potential Good   PT Frequency 2x / week   PT Duration 8 weeks   PT Treatment/Interventions ADLs/Self Care Home Management;Aquatic Therapy;Moist Heat;Therapeutic exercise;Therapeutic activities;Functional mobility training;Stair training;Gait training;Balance training;Neuromuscular re-education;Patient/family education;Orthotic Fit/Training;Manual techniques;Passive range of motion   PT Next Visit Plan d/c    PT  Home Exercise Plan walking 3 min/day; supine ab set, bridge, clamshell with blue TB, supine hip flexor stretch, standing calf raises x20 reps; standing hip flexor stretch    Consulted and Agree with Plan of Care Patient      Patient will benefit from skilled therapeutic intervention in order to improve the following deficits and impairments:  Abnormal gait, Decreased activity tolerance, Decreased safety awareness, Decreased strength, Impaired flexibility, Postural dysfunction, Pain, Improper body mechanics, Decreased range of motion, Decreased balance, Decreased endurance, Decreased mobility, Difficulty walking  Visit Diagnosis: Unsteadiness on feet  Muscle weakness  (generalized)  Other abnormalities of gait and mobility       G-Codes - 12-08-2016 1704    Functional Assessment Tool Used Clinical judgement based on assessment of balance, strength and functional mobility   Functional Limitation Mobility: Walking and moving around   Mobility: Walking and Moving Around Goal Status [id number]) At least 40 percent but less than 60 percent impaired, limited or restricted   Mobility: Walking and Moving Around Discharge Status [phone]) At least 40 percent but less than 60 percent impaired, limited or restricted      Problem List Patient Active Problem List   Diagnosis Date Noted  . Macular degeneration 11/10/2016  . Insomnia 07/20/2016  . Restless legs 07/20/2016  . Spinal stenosis in cervical region 10/14/2015  . Senile purpura ([place]) 04/18/2015  . Osteopenia 01/28/2015  . Spinal stenosis of lumbar region 01/20/2015  . BPH (benign prostatic hyperplasia) 01/20/2015  . Diverticulosis of colon without hemorrhage   . Encounter for screening colonoscopy 11/21/2014  . HTN (hypertension), benign 04/17/2014  . Temporal arteritis ([place]) 04/17/2014   PHYSICAL THERAPY DISCHARGE SUMMARY  Visits from Start of Care: 24  Current functional level related to goals / functional outcomes: Berg balance 45/56, 4+/5 MMT BLE strength, improved functional strength/balance with testing. See above for more details.    Remaining deficits: Hip flexibility, plateau in balance improvements from last reassessment. See above for more details.    Education / Equipment: improvements made overall with balance/strength/mobility; importance of continued HEP adherence for further improvements; addition to HEP Plan: Patient agrees to discharge.  Patient goals were partially met. Patient is being discharged due to meeting the stated rehab goals.  ?????      5:06 PM,December 08, 2016 Elly Modena PT, DPT [facility] [place] [address] [place], [place], [address] Phone: [phone]   Fax:  [phone]  Name: Gerald Hall MRN: [id number] Date of Birth: 1935-09-23

## 2016-11-25 ENCOUNTER — Ambulatory Visit (HOSPITAL_COMMUNITY): Payer: Medicare Other | Admitting: Physical Therapy

## 2016-12-21 IMAGING — CR DG HIP (WITH OR WITHOUT PELVIS) 2-3V*R*
3 series · 3 of 3 positions shown · non-contrast
Comparison: None.

CLINICAL DATA: Right hip pain for 1 year, no known injury

EXAM:
RIGHT HIP (WITH PELVIS) 2-3 VIEWS

[view not recorded (1 of 3)]
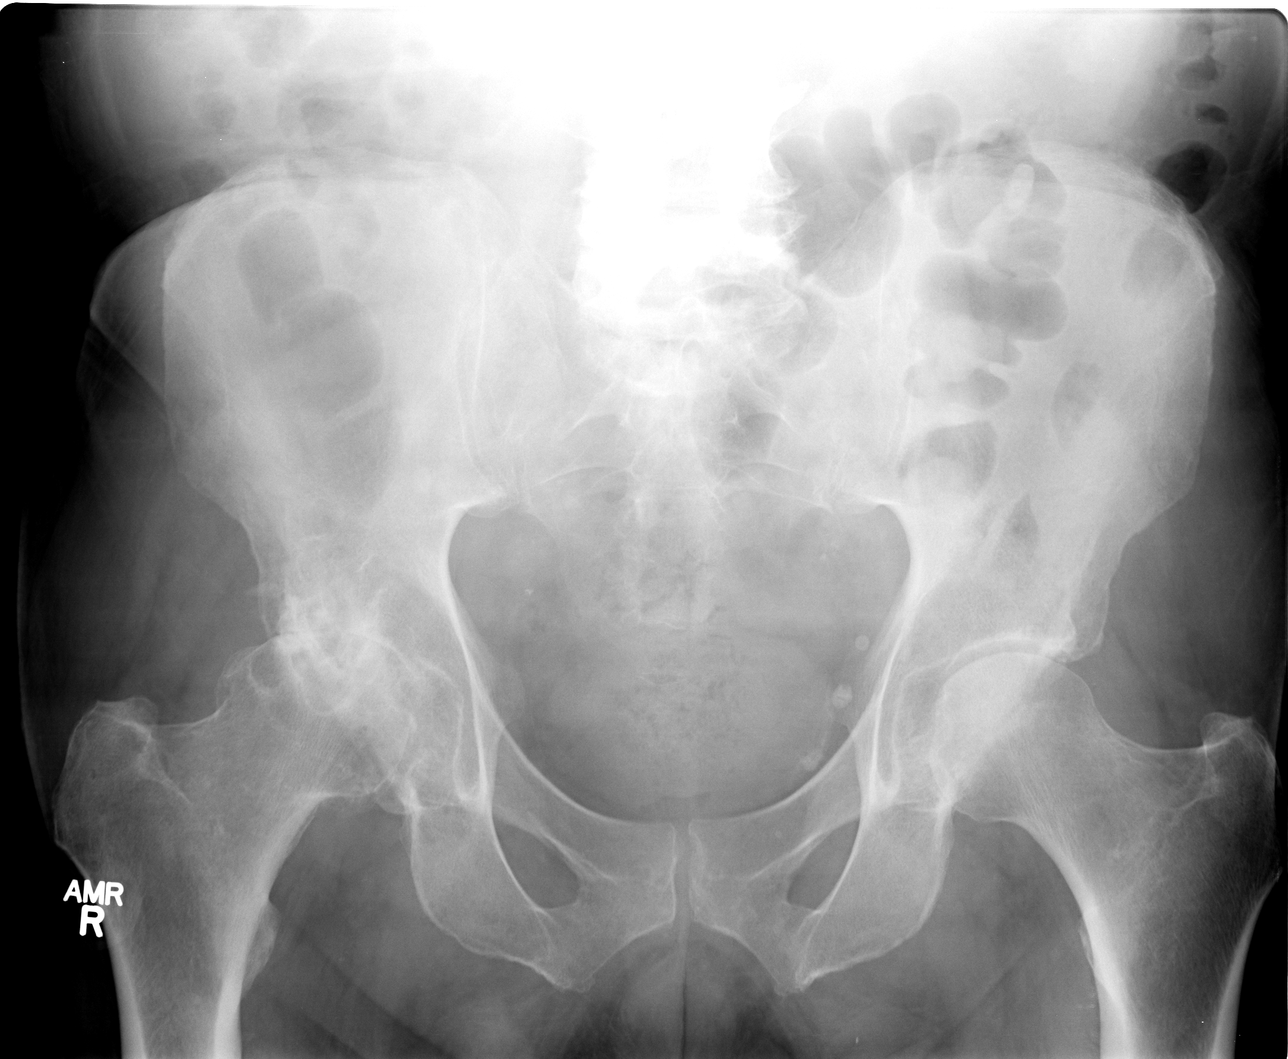

[view not recorded (2 of 3)]
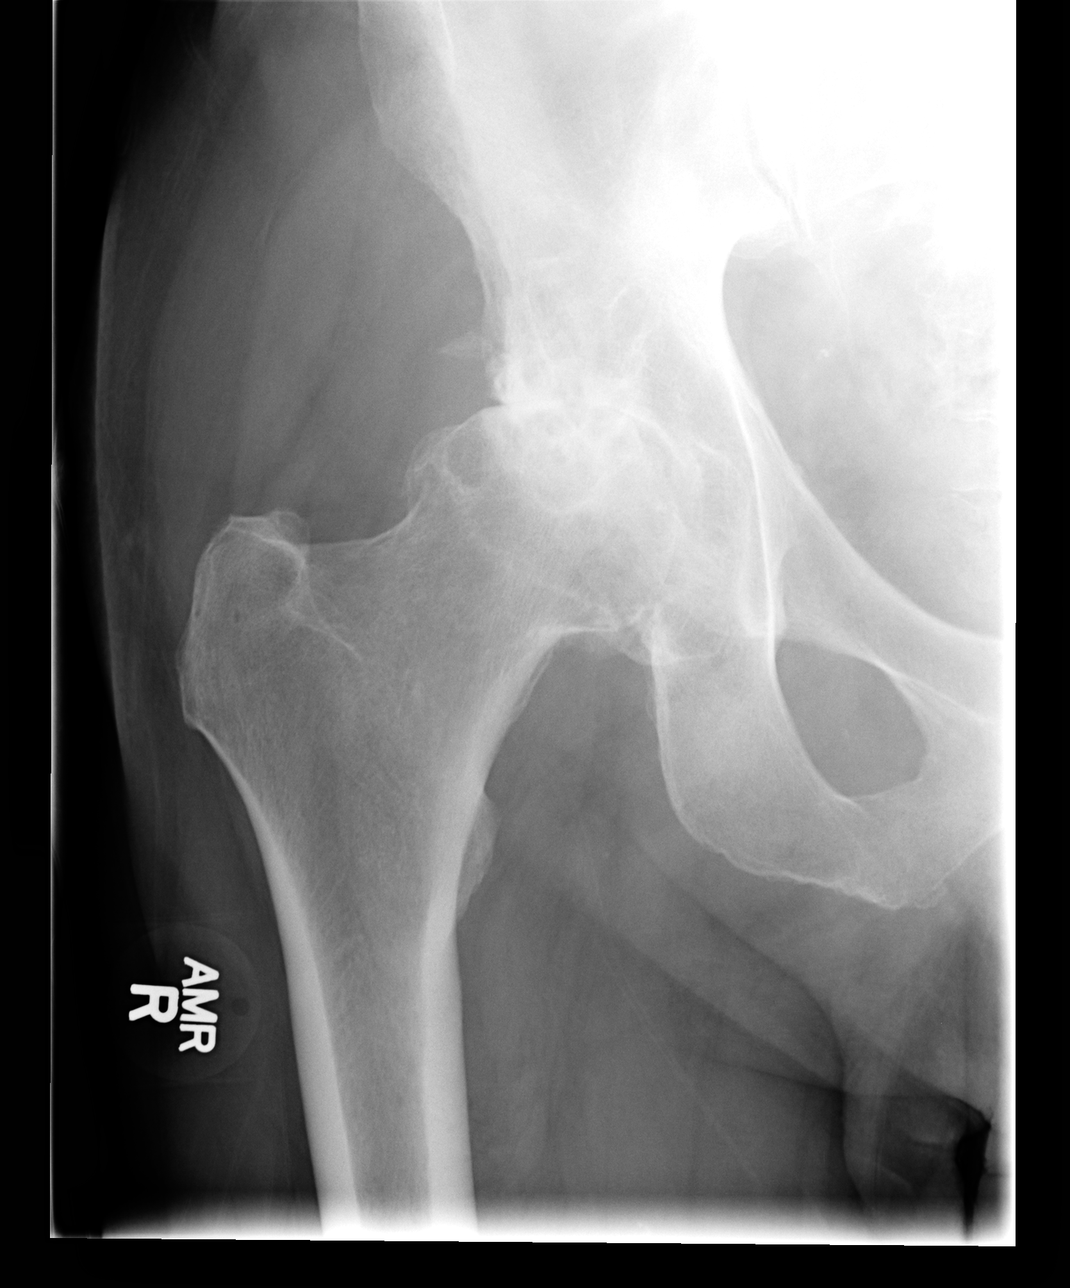

[view not recorded (3 of 3)]
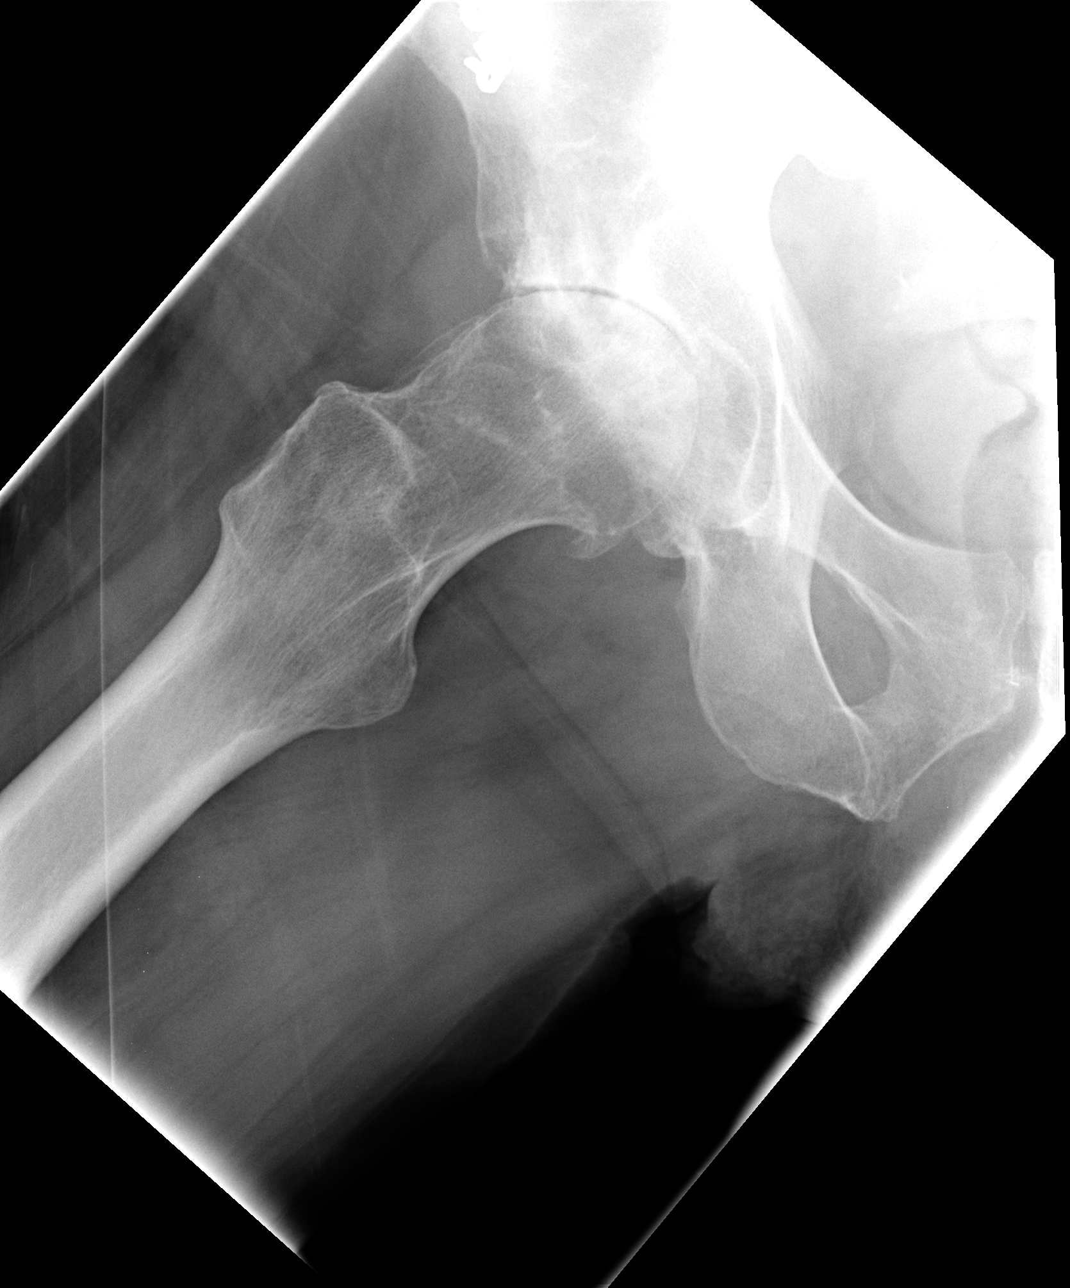

[3 of 3 positions shown; findings below may reference images not displayed]

FINDINGS: Three views of the right hip submitted. Significant osteoarthritic
changes with significant narrowing superior hip joint space. Mild
remodeling superior aspect of femoral head. Cystic and sclerotic
changes are noted right femoral head and right superior acetabulum.
No acute fracture or subluxation.
IMPRESSION: No acute fracture or subluxation. Significant osteoarthritic changes
right hip joint.

## 2016-12-22 ENCOUNTER — Other Ambulatory Visit: Payer: Self-pay | Admitting: Family Medicine

## 2016-12-22 NOTE — Telephone Encounter (Signed)
Last seen 11/19/15

## 2017-01-07 ENCOUNTER — Other Ambulatory Visit: Payer: Self-pay | Admitting: *Deleted

## 2017-01-07 MED ORDER — LORAZEPAM 1 MG PO TABS: 0.5000 mg | ORAL_TABLET | Freq: Every evening | ORAL | 3 refills | Status: DC | PRN

## 2017-01-07 NOTE — Telephone Encounter (Signed)
May have this +3 refills 

## 2017-01-17 DIAGNOSIS — M316 Other giant cell arteritis: Secondary | ICD-10-CM | POA: Diagnosis not present

## 2017-01-18 DIAGNOSIS — M79651 Pain in right thigh: Secondary | ICD-10-CM | POA: Diagnosis not present

## 2017-01-18 DIAGNOSIS — R102 Pelvic and perineal pain: Secondary | ICD-10-CM | POA: Diagnosis not present

## 2017-01-18 DIAGNOSIS — M79604 Pain in right leg: Secondary | ICD-10-CM | POA: Diagnosis not present

## 2017-01-18 DIAGNOSIS — H543 Unqualified visual loss, both eyes: Secondary | ICD-10-CM | POA: Diagnosis not present

## 2017-01-27 ENCOUNTER — Ambulatory Visit: Payer: Medicare Other | Admitting: Family Medicine

## 2017-01-28 DIAGNOSIS — Q828 Other specified congenital malformations of skin: Secondary | ICD-10-CM | POA: Diagnosis not present

## 2017-01-28 DIAGNOSIS — B351 Tinea unguium: Secondary | ICD-10-CM | POA: Diagnosis not present

## 2017-01-31 ENCOUNTER — Encounter: Payer: Self-pay | Admitting: Family Medicine

## 2017-01-31 ENCOUNTER — Ambulatory Visit (INDEPENDENT_AMBULATORY_CARE_PROVIDER_SITE_OTHER): Payer: Medicare Other | Admitting: Family Medicine

## 2017-01-31 VITALS — BP 138/86 | Ht 69.0 in | Wt 174.2 lb

## 2017-01-31 DIAGNOSIS — I10 Essential (primary) hypertension: Secondary | ICD-10-CM

## 2017-01-31 DIAGNOSIS — G4700 Insomnia, unspecified: Secondary | ICD-10-CM

## 2017-01-31 DIAGNOSIS — D692 Other nonthrombocytopenic purpura: Secondary | ICD-10-CM | POA: Diagnosis not present

## 2017-01-31 DIAGNOSIS — N401 Enlarged prostate with lower urinary tract symptoms: Secondary | ICD-10-CM

## 2017-01-31 DIAGNOSIS — M48061 Spinal stenosis, lumbar region without neurogenic claudication: Secondary | ICD-10-CM | POA: Diagnosis not present

## 2017-01-31 DIAGNOSIS — Z13220 Encounter for screening for lipoid disorders: Secondary | ICD-10-CM

## 2017-01-31 DIAGNOSIS — R3912 Poor urinary stream: Secondary | ICD-10-CM

## 2017-01-31 DIAGNOSIS — G2581 Restless legs syndrome: Secondary | ICD-10-CM

## 2017-01-31 MED ORDER — ROPINIROLE HCL 3 MG PO TABS: 3.0000 mg | ORAL_TABLET | Freq: Every day | ORAL | 6 refills | Status: DC

## 2017-01-31 NOTE — Progress Notes (Signed)
   Subjective:    Patient ID: Gerald Hall, male    DOB: [birth year], 82 y.o.   MRN: [id number]  Hypertension  This is a chronic problem. The current episode started more than 1 year ago. Pertinent negatives include no chest pain, headaches or shortness of breath. There are no compliance problems.    Patient has concerns of restless leg syndrome. Patient relates restless leg syndrome keeps him awake at night other times bothers him in the middle of the evening he feels he needs to go up on the dose of the medicine  He states he takes his blood pressure medicine on a regular basis tries to watch how he eats.  He does have mild ataxia related to arthritis in his hip pain he is seen orthopedics specialist in the coming weeks for evaluation for possible surgery He does have allergy issues uses medicine on a when necessary basis He also has back and hip pain that he uses tramadol for Patient does use trazodone as well as Ativan to help him sleep at night denies any falls Does not drive Has an assistant that helps him   Review of Systems  Constitutional: Negative for activity change, fatigue and fever.  Respiratory: Negative for cough and shortness of breath.   Cardiovascular: Negative for chest pain and leg swelling.  Neurological: Negative for headaches.       Objective:   Physical Exam  Constitutional: He appears well-nourished. No distress.  Cardiovascular: Normal rate, regular rhythm and normal heart sounds.   No murmur heard. Pulmonary/Chest: Effort normal and breath sounds normal. No respiratory distress.  Musculoskeletal: He exhibits no edema.  Lymphadenopathy:    He has no cervical adenopathy.  Neurological: He is alert.  Psychiatric: His behavior is normal.  Vitals reviewed.   Senile purpura stable      Assessment & Plan:  Patient having visual symptoms will be following up with ophthalmology they're doing further evaluation  Restless legs increase medication if  that does not adequately help to let us know  Insomnia uses Ativan and trazodone at night to help  Chronic low back pain also with right hip pain tramadol when necessary  Senile purpura stable  Patient is due cholesterol profile  Diuretic use patient do metabolic 7 blood pressure good control  Patient will be seeing orthopedics he will let us know how his appointment when if they are contemplating surgery I recommend cardiovascular clearance with cardiology first  Follow-up in approximate 4-5 months sooner if any

## 2017-02-02 ENCOUNTER — Telehealth: Payer: Self-pay | Admitting: Family Medicine

## 2017-02-02 DIAGNOSIS — Z01818 Encounter for other preprocedural examination: Secondary | ICD-10-CM

## 2017-02-02 DIAGNOSIS — M87051 Idiopathic aseptic necrosis of right femur: Secondary | ICD-10-CM | POA: Diagnosis not present

## 2017-02-02 NOTE — Telephone Encounter (Signed)
Patient saw the orthopedic doctor today and was told he needed a hip replacement.  He is requesting a call back from Dr. Nicki Reaper to follow up on this.

## 2017-02-03 DIAGNOSIS — I10 Essential (primary) hypertension: Secondary | ICD-10-CM | POA: Diagnosis not present

## 2017-02-03 DIAGNOSIS — Z13220 Encounter for screening for lipoid disorders: Secondary | ICD-10-CM | POA: Diagnosis not present

## 2017-02-04 DIAGNOSIS — H47091 Other disorders of optic nerve, not elsewhere classified, right eye: Secondary | ICD-10-CM | POA: Diagnosis not present

## 2017-02-04 DIAGNOSIS — H04123 Dry eye syndrome of bilateral lacrimal glands: Secondary | ICD-10-CM | POA: Diagnosis not present

## 2017-02-04 DIAGNOSIS — H3554 Dystrophies primarily involving the retinal pigment epithelium: Secondary | ICD-10-CM | POA: Diagnosis not present

## 2017-02-04 LAB — LIPID PANEL
CHOL/HDL RATIO: 4 ratio (ref 0.0–5.0)
Cholesterol, Total: 143 mg/dL (ref 100–199)
HDL: 36 mg/dL — AB (ref >39)
LDL Calculated: 81 mg/dL (ref 0–99)
Triglycerides: 129 mg/dL (ref 0–149)
VLDL Cholesterol Cal: 26 mg/dL (ref 5–40)

## 2017-02-04 LAB — BASIC METABOLIC PANEL
BUN / CREAT RATIO: 12 (ref 10–24)
BUN: 14 mg/dL (ref 8–27)
CALCIUM: 9.5 mg/dL (ref 8.6–10.2)
CHLORIDE: 97 mmol/L (ref 96–106)
CO2: 28 mmol/L (ref 18–29)
Creatinine, Ser: 1.17 mg/dL (ref 0.76–1.27)
GFR calc Af Amer: 68 mL/min/{1.73_m2} (ref >59)
GFR calc non Af Amer: 59 mL/min/{1.73_m2} — ABNORMAL LOW (ref >59)
Glucose: 88 mg/dL (ref 65–99)
Potassium: 3.7 mmol/L (ref 3.5–5.2)
Sodium: 142 mmol/L (ref 134–144)

## 2017-02-04 NOTE — Telephone Encounter (Signed)
Referral put in.

## 2017-02-04 NOTE — Telephone Encounter (Signed)
Pt called again, has not heard from Dr. Nicki Reaper  Pt states that he has seen ortho & will be getting hip surgery Needs referral to cardiology for surgical clearance  Would like Dr. Nicki Reaper to call him back  Please advise

## 2017-02-04 NOTE — Telephone Encounter (Signed)
I spoke with the patient he is going to be having his hip surgery and [facility] please go ahead and get him set up with cardiology at [facility] referral/relatively urgent/preoperative cardiology clearance for hip surgery-please put him referral-forward to [place] so she will call him with appointment next week thanks

## 2017-02-07 ENCOUNTER — Telehealth: Payer: Self-pay | Admitting: Family Medicine

## 2017-02-07 DIAGNOSIS — M87051 Idiopathic aseptic necrosis of right femur: Secondary | ICD-10-CM | POA: Diagnosis not present

## 2017-02-07 NOTE — Telephone Encounter (Signed)
Patient calling again about referral to Cardiologist.  He has gotten his surgery scheduled for next Tuesday but needs clearance before that. Is there any way to get him in with a Cardiologist this week? [phone]

## 2017-02-07 NOTE — Telephone Encounter (Signed)
Please call cardiology- have them try to accommodate please

## 2017-02-07 NOTE — Telephone Encounter (Signed)
Patient has appointment with Cardiology on 02/21/2017. Patient states they would like to move his surgery up to next Tuesday so he would like to move the appointment to this week at preferably in [place] not [place].

## 2017-02-08 NOTE — Telephone Encounter (Signed)
Called [facility]  They do not have a sooner appointment [facility] or [place] for a new patient), the absolute only one they have is on Monday, 02/21/17 @ 8:40 with Dr. Bronson Ing and have placed pt on a wait list to be called if they happen to get a cancellation  Called to notify pt, he is not satisfied with the appointment still being on 02/21/17,  he now wants to just keep his appointment in [place].  (that appointment was cancelled to accommodate the request for appointment to be in [place])  [facility] office back and was lucky enough that they were able to put the appointment back like it was.

## 2017-02-09 ENCOUNTER — Encounter: Payer: Self-pay | Admitting: Family Medicine

## 2017-02-09 NOTE — Telephone Encounter (Signed)
noted 

## 2017-02-16 ENCOUNTER — Telehealth: Payer: Self-pay | Admitting: Family Medicine

## 2017-02-16 NOTE — Telephone Encounter (Signed)
Last seen 01/31/17

## 2017-02-16 NOTE — Telephone Encounter (Signed)
Requesting Rx for Lorazepam to [facility].

## 2017-02-17 ENCOUNTER — Other Ambulatory Visit: Payer: Self-pay | Admitting: *Deleted

## 2017-02-17 MED ORDER — LORAZEPAM 1 MG PO TABS: 0.5000 mg | ORAL_TABLET | Freq: Every evening | ORAL | 3 refills | Status: DC | PRN

## 2017-02-17 NOTE — Telephone Encounter (Signed)
Send in refill with 3 additional refills

## 2017-02-17 NOTE — Telephone Encounter (Signed)
done

## 2017-02-19 NOTE — Progress Notes (Signed)
Cardiology Office Note  Date:  02/21/2017   ID:  Gerald Hall, Gerald Hall 29-Apr-1935, MRN [id number]  PCP:  Gerald Lange, MD   Chief Complaint  Patient presents with  . other     Pre OP for total [facility] ortho Dr. Rudene Hall. Pt states he is doing well. Reviewed meds with pt verbally.    HPI:  Gerald Hall is a pleasant 82 year old gentleman with long history of smoking, who continues to smoke,  Hypertension Temporal arteritis treated with prednisone, Balance problems, walks with a walker Prior neck surgery November 2016 Right hip pain, severe osteoarthritis Who presents by referral from orthopedics, Gerald Hall, for preoperative cardiac evaluation  He has a caretaker 24 hours a day No regular exercise program but able to walk without significant symptoms Denies any significant shortness of breath on exertion, no claudication type symptoms Denies any history of anginal symptoms Walking into the office today from the parking lot, denied any shortness of breath or chest pain  No prior cardiac history In fact previous workup includes Carotid u/s: no significant disease  10/11 No AAA, 2014  Denies having any strong family history of coronary disease No previous imaging documenting coronary or peripheral vascular disease  EKG on today's visit person reviewed by myself shows normal sinus rhythm with rate 71 bpm with no significant ST or T-wave changes. Borderline Q waves inferiorly leads noted on today's visit as well as back in 2015 No significant change when compared to previous EKG   PMH:   has a past medical history of Blood in urine; Deafness in right ear (age 69); and Hyperlipidemia.  PSH:    Past Surgical History:  Procedure Laterality Date  . BACK SURGERY  2010  . CATARACT EXTRACTION Bilateral 07/30/14, 08/13/2014  . CERVICAL SPINE SURGERY  09/01/15   C 4-5 , [place]  . COLONOSCOPY  12/19/2007   RMR: 1. External hemorrohids, otherwise normal rectum.2. Normal colon. 3.  Normal terminal ileum.  . COLONOSCOPY N/A 12/13/2014   Procedure: COLONOSCOPY;  Surgeon: Gerald Dolin, MD;  [facility];  Service: Endoscopy;  Laterality: N/A;  11:15 Pt Request Time  . KNEE SURGERY Left    around 2000, arthroscopic    Current Outpatient Prescriptions  Medication Sig Dispense Refill  . Acetaminophen (TYLENOL) 325 MG CAPS Take by mouth.    Marland Kitchen aspirin EC 81 MG tablet Take 81 mg by mouth daily.    . cholecalciferol (VITAMIN D) 400 units TABS tablet Take 400 Units by mouth.    . fluticasone (FLONASE) 50 MCG/ACT nasal spray 2 SPRAYS EACH NOSTRIL DAILY 16 g 5  . hydrochlorothiazide (HYDRODIURIL) 25 MG tablet Take 1 tablet (25 mg total) by mouth daily. 90 tablet 3  . LORazepam (ATIVAN) 1 MG tablet Take 0.5-1 tablets (0.5-1 mg total) by mouth at bedtime as needed (insomnia). 30 tablet 3  . potassium chloride (K-DUR) 10 MEQ tablet Take 1 tablet (10 mEq total) by mouth daily. 90 tablet 3  . rOPINIRole (REQUIP) 3 MG tablet Take 1 tablet (3 mg total) by mouth at bedtime. 30 tablet 6  . senna-docusate (SENOKOT-S) 8.6-50 MG tablet Take by mouth daily.    . tamsulosin (FLOMAX) 0.4 MG CAPS capsule TAKE 1 CAPSULE DAILY AT BEDTIME. 90 capsule 3  . traMADol (ULTRAM) 50 MG tablet Take 1 tablet (50 mg total) by mouth every 6 (six) hours as needed. 120 tablet 4  . traZODone (DESYREL) 50 MG tablet TAKE 1/2 TO 1 TABLET BY  MOUTH AT BEDTIME AS NEEDED FOR SLEEP 30 tablet 0   No current facility-administered medications for this visit.      Allergies:   Patient has no known allergies.   Social History:  The patient  reports that he has been smoking Cigarettes.  He has been smoking about 0.50 packs per day. He has never used smokeless tobacco. He reports that he drinks alcohol. He reports that he does not use drugs.   Family History:   family history includes Cancer in his brother; Diabetes in his brother and brother.    Review of Systems: Review of Systems  Constitutional:  Negative.   Respiratory: Negative.   Cardiovascular: Negative.   Gastrointestinal: Negative.   Musculoskeletal: Negative.   Neurological: Negative.   Psychiatric/Behavioral: Negative.   All other systems reviewed and are negative.    PHYSICAL EXAM: VS:  BP 128/78 (BP Location: Left Arm, Patient Position: Sitting, Cuff Size: Normal)   Pulse 71   Ht 5\' 9"  (1.753 m)   Wt 174 lb 4 oz (79 kg)   BMI 25.73 kg/m  , BMI Body mass index is 25.73 kg/m. GEN: Well nourished, well developed, in no acute distress  HEENT: normal  Neck: no JVD, carotid bruits, or masses Cardiac: RRR; no murmurs, rubs, or gallops,no edema  Respiratory:  clear to auscultation bilaterally, normal work of breathing GI: soft, nontender, nondistended, + BS MS: no deformity or atrophy  Skin: warm and dry, no rash Neuro:  Strength and sensation are intact Psych: euthymic mood, full affect    Recent Labs: 07/20/2016: ALT 9; Platelets 248 02/03/2017: BUN 14; Creatinine, Ser 1.17; Potassium 3.7; Sodium 142    Lipid Panel Lab Results  Component Value Date   CHOL 143 02/03/2017   HDL 36 (L) 02/03/2017   LDLCALC 81 02/03/2017   TRIG 129 02/03/2017      Wt Readings from Last 3 Encounters:  02/21/17 174 lb 4 oz (79 kg)  01/31/17 174 lb 4 oz (79 kg)  09/28/16 173 lb (78.5 kg)       ASSESSMENT AND PLAN:  HTN (hypertension), benign - Plan: EKG 12-Lead Blood pressure is well controlled on today's visit. No changes made to the medications.  Preop cardiovascular exam - Plan: EKG 12-Lead records reviewed with him in detail  Denies any anginal symptoms  Being scheduled for low risk surgery  Apart from smoking, no other risk factors  He is not a diabetic, does not have hyperlipidemia  No further testing needed at this time given he is asymptomatic  We did talk anginal symptoms with him and if he does develop symptoms in the future, recommended he call our office   Primary osteoarthritis involving multiple  joints Scheduled for total right hip replacement surgery with Dr. Rudene Hall Acceptable risk for surgery  Smoker We have encouraged him to continue to work on weaning his cigarettes and smoking cessation. He will continue to work on this and does not want any assistance with chantix.   Dispositioas neededn:   F/U  12 months    Total encounter time more than 45 minutes  Greater than 50% was spent in counseling and coordination of care with the patient    Orders Placed This Encounter  Procedures  . EKG 12-Lead     Signed, Esmond Plants, M.D., Ph.D. 02/21/2017  [place], [place]

## 2017-02-21 ENCOUNTER — Ambulatory Visit (INDEPENDENT_AMBULATORY_CARE_PROVIDER_SITE_OTHER): Payer: Medicare Other | Admitting: Cardiovascular Disease

## 2017-02-21 ENCOUNTER — Encounter: Payer: Self-pay | Admitting: Cardiovascular Disease

## 2017-02-21 ENCOUNTER — Ambulatory Visit: Payer: Medicare Other | Admitting: Cardiovascular Disease

## 2017-02-21 VITALS — BP 128/78 | HR 71 | Ht 69.0 in | Wt 174.2 lb

## 2017-02-21 DIAGNOSIS — I10 Essential (primary) hypertension: Secondary | ICD-10-CM

## 2017-02-21 DIAGNOSIS — M150 Primary generalized (osteo)arthritis: Secondary | ICD-10-CM | POA: Diagnosis not present

## 2017-02-21 DIAGNOSIS — Z01810 Encounter for preprocedural cardiovascular examination: Secondary | ICD-10-CM | POA: Diagnosis not present

## 2017-02-21 DIAGNOSIS — F17200 Nicotine dependence, unspecified, uncomplicated: Secondary | ICD-10-CM | POA: Diagnosis not present

## 2017-02-21 DIAGNOSIS — M159 Polyosteoarthritis, unspecified: Secondary | ICD-10-CM

## 2017-02-21 NOTE — Patient Instructions (Addendum)
Medication Instructions:   No medication changes made  Labwork:  No new labs needed  Testing/Procedures:  No further testing at this time   I recommend watching educational videos on topics of interest to you at:       [email or web address]  Enter code: [email or web address]    Follow-Up: It was a pleasure seeing you in the office today. Please call us if you have new issues that need to be addressed before your next appt.  [phone]  Your physician wants you to follow-up in: 12 months as needed  If you need a refill on your cardiac medications before your next appointment, please call your pharmacy.

## 2017-02-22 ENCOUNTER — Encounter
Admission: RE | Admit: 2017-02-22 | Discharge: 2017-02-22 | Disposition: A | Discharge status: Home / Self Care | Payer: Medicare Other | Source: Ambulatory Visit | Attending: Orthopedic Surgery | Admitting: Orthopedic Surgery

## 2017-02-22 DIAGNOSIS — K5730 Diverticulosis of large intestine without perforation or abscess without bleeding: Secondary | ICD-10-CM | POA: Insufficient documentation

## 2017-02-22 DIAGNOSIS — R262 Difficulty in walking, not elsewhere classified: Secondary | ICD-10-CM

## 2017-02-22 DIAGNOSIS — I10 Essential (primary) hypertension: Secondary | ICD-10-CM

## 2017-02-22 DIAGNOSIS — G2581 Restless legs syndrome: Secondary | ICD-10-CM

## 2017-02-22 DIAGNOSIS — M4802 Spinal stenosis, cervical region: Secondary | ICD-10-CM | POA: Insufficient documentation

## 2017-02-22 DIAGNOSIS — F17200 Nicotine dependence, unspecified, uncomplicated: Secondary | ICD-10-CM

## 2017-02-22 DIAGNOSIS — G4700 Insomnia, unspecified: Secondary | ICD-10-CM | POA: Insufficient documentation

## 2017-02-22 DIAGNOSIS — M8580 Other specified disorders of bone density and structure, unspecified site: Secondary | ICD-10-CM

## 2017-02-22 DIAGNOSIS — E785 Hyperlipidemia, unspecified: Secondary | ICD-10-CM | POA: Insufficient documentation

## 2017-02-22 DIAGNOSIS — H3530 Unspecified macular degeneration: Secondary | ICD-10-CM

## 2017-02-22 DIAGNOSIS — Z01812 Encounter for preprocedural laboratory examination: Secondary | ICD-10-CM | POA: Insufficient documentation

## 2017-02-22 DIAGNOSIS — D692 Other nonthrombocytopenic purpura: Secondary | ICD-10-CM

## 2017-02-22 HISTORY — DX: Unspecified osteoarthritis, unspecified site: M19.90

## 2017-02-22 HISTORY — DX: Unspecified urinary incontinence: R32

## 2017-02-22 HISTORY — DX: Other giant cell arteritis: M31.6

## 2017-02-22 HISTORY — DX: Other abnormalities of gait and mobility: R26.89

## 2017-02-22 HISTORY — DX: Essential (primary) hypertension: I10

## 2017-02-22 LAB — TYPE AND SCREEN
ABO/RH(D): O POS
Antibody Screen: NEGATIVE

## 2017-02-22 LAB — BASIC METABOLIC PANEL
Anion gap: 9 (ref 5–15)
BUN: 19 mg/dL (ref 6–20)
CHLORIDE: 99 mmol/L — AB (ref 101–111)
CO2: 31 mmol/L (ref 22–32)
Calcium: 9.5 mg/dL (ref 8.9–10.3)
Creatinine, Ser: 1.2 mg/dL (ref 0.61–1.24)
GFR calc Af Amer: >60 mL/min (ref >60)
GFR calc non Af Amer: 56 mL/min — ABNORMAL LOW (ref >60)
GLUCOSE: 91 mg/dL (ref 65–99)
POTASSIUM: 3.1 mmol/L — AB (ref 3.5–5.1)
Sodium: 139 mmol/L (ref 135–145)

## 2017-02-22 LAB — URINALYSIS, ROUTINE W REFLEX MICROSCOPIC
BILIRUBIN URINE: NEGATIVE
Bacteria, UA: NONE SEEN
GLUCOSE, UA: NEGATIVE mg/dL
Ketones, ur: NEGATIVE mg/dL
Leukocytes, UA: NEGATIVE
NITRITE: NEGATIVE
PROTEIN: NEGATIVE mg/dL
Specific Gravity, Urine: 1.01 (ref 1.005–1.030)
Squamous Epithelial / LPF: NONE SEEN
pH: 5 (ref 5.0–8.0)

## 2017-02-22 LAB — APTT: APTT: 33 s (ref 24–36)

## 2017-02-22 LAB — CBC
HCT: 48.9 % (ref 40.0–52.0)
HEMOGLOBIN: 16.4 g/dL (ref 13.0–18.0)
MCH: 31.7 pg (ref 26.0–34.0)
MCHC: 33.6 g/dL (ref 32.0–36.0)
MCV: 94.4 fL (ref 80.0–100.0)
PLATELETS: 186 10*3/uL (ref 150–440)
RBC: 5.18 MIL/uL (ref 4.40–5.90)
RDW: 14.7 % — ABNORMAL HIGH (ref 11.5–14.5)
WBC: 9.8 10*3/uL (ref 3.8–10.6)

## 2017-02-22 LAB — SURGICAL PCR SCREEN
MRSA, PCR: NEGATIVE
Staphylococcus aureus: NEGATIVE

## 2017-02-22 LAB — PROTIME-INR
INR: 0.92
Prothrombin Time: 12.4 seconds (ref 11.4–15.2)

## 2017-02-22 LAB — SEDIMENTATION RATE: SED RATE: 5 mm/h (ref 0–20)

## 2017-02-22 MED ORDER — SODIUM CHLORIDE 0.9 % IV SOLN
1000.0000 mg | INTRAVENOUS | Status: AC
  Administered 2017-02-23: 1000 mg via INTRAVENOUS
  Filled 2017-02-22: qty 10

## 2017-02-22 MED ORDER — CEFAZOLIN SODIUM-DEXTROSE 2-4 GM/100ML-% IV SOLN
2.0000 g | Freq: Once | INTRAVENOUS | Status: AC
  Administered 2017-02-23: 2 g via INTRAVENOUS

## 2017-02-22 NOTE — Pre-Procedure Instructions (Addendum)
Called Dr. Rudene Christians office to see if TED hose or SCD's need to be ordered preoperatively.  Response from Mendel Ryder, Dr. Rudene Christians nurse will receive treatment after surgery to prevent DVT's.  Cardiac clearance in front of chart, EKG in epic.

## 2017-02-22 NOTE — Pre-Procedure Instructions (Addendum)
Today's potassium level 3.1, faxed to Dr. Theodore Demark office noting low potassium level, need for eval and treatment as indicated and that the potassium will be recheck in am.  Crystal at Dr. Rudene Christians office notified of low potassium level, she responded OK.

## 2017-02-22 NOTE — Patient Instructions (Signed)
  Your procedure is scheduled YQ:MGNOIBBC Mary 2, 2018. Report to Same Day Surgery at 10:00 am .  Remember: Instructions that are not followed completely may result in serious medical risk, up to and including death, or upon the discretion of your surgeon and anesthesiologist your surgery may need to be rescheduled.    _x___ 1. Do not eat food or drink liquids after midnight. No gum chewing or hard candies.     _x___ 2. No Alcohol for 24 hours before or after surgery.   ____ 3. Bring all medications with you on the day of surgery if instructed.    __x__ 4. Notify your doctor if there is any change in your medical condition     (cold, fever, infections).    __x___ 5. No smoking 24 hours prior to surgery.     Do not wear jewelry, make-up, hairpins, clips or nail polish.  Do not wear lotions, powders, or perfumes.   Do not shave 48 hours prior to surgery. Men may shave face and neck.  Do not bring valuables to the hospital.    [facility] is not responsible for any belongings or valuables.               Contacts, dentures or bridgework may not be worn into surgery.  Leave your suitcase in the car. After surgery it may be brought to your room.  For patients admitted to the hospital, discharge time is determined by your treatment team.   Patients discharged the day of surgery will not be allowed to drive home.    Please read over the following fact sheets that you were given:   [facility] Preparing for Surgery  _x___ Take these medicines the morning of surgery with A SIP OF WATER: NONE     ____ Fleet Enema (as directed)   _x___ Use CHG Soap as directed on instruction sheet  ____ Use inhalers on the day of surgery and bring to hospital day of surgery  ____ Stop metformin 2 days prior to surgery    ____ Take 1/2 of usual insulin dose the night before surgery and none on the morning of surgery.   _x___ Stop Coumadin/Plavix/aspirin as directed by Dr. Rudene Christians.  _x___ Stop  Anti-inflammatories such as Advil, Aleve, Ibuprofen, Motrin, Naproxen, Naprosyn, Goodies powders or aspirin products. OK to take Tylenol.   ____ Stop supplements until after surgery.    ____ Bring C-Pap to the hospital.

## 2017-02-23 ENCOUNTER — Inpatient Hospital Stay: Payer: Medicare Other

## 2017-02-23 ENCOUNTER — Encounter
Admission: RE | Disposition: A | Discharge status: Home Health | Payer: Self-pay | Source: Ambulatory Visit | Attending: Orthopedic Surgery

## 2017-02-23 ENCOUNTER — Inpatient Hospital Stay: Payer: Medicare Other | Admitting: Anesthesiology

## 2017-02-23 ENCOUNTER — Other Ambulatory Visit: Payer: Self-pay | Admitting: Family Medicine

## 2017-02-23 ENCOUNTER — Encounter: Payer: Self-pay | Admitting: Anesthesiology

## 2017-02-23 ENCOUNTER — Inpatient Hospital Stay
Admission: RE | Admit: 2017-02-23 | Discharge: 2017-02-25 | DRG: 470 | Disposition: A | Discharge status: Home Health | Payer: Medicare Other | Source: Ambulatory Visit | Attending: Orthopedic Surgery | Admitting: Orthopedic Surgery

## 2017-02-23 DIAGNOSIS — E876 Hypokalemia: Secondary | ICD-10-CM | POA: Diagnosis present

## 2017-02-23 DIAGNOSIS — N189 Chronic kidney disease, unspecified: Secondary | ICD-10-CM | POA: Diagnosis not present

## 2017-02-23 DIAGNOSIS — D62 Acute posthemorrhagic anemia: Secondary | ICD-10-CM | POA: Diagnosis not present

## 2017-02-23 DIAGNOSIS — M87051 Idiopathic aseptic necrosis of right femur: Secondary | ICD-10-CM | POA: Diagnosis present

## 2017-02-23 DIAGNOSIS — I739 Peripheral vascular disease, unspecified: Secondary | ICD-10-CM | POA: Diagnosis present

## 2017-02-23 DIAGNOSIS — M316 Other giant cell arteritis: Secondary | ICD-10-CM | POA: Diagnosis present

## 2017-02-23 DIAGNOSIS — M87851 Other osteonecrosis, right femur: Principal | ICD-10-CM | POA: Diagnosis present

## 2017-02-23 DIAGNOSIS — H9191 Unspecified hearing loss, right ear: Secondary | ICD-10-CM | POA: Diagnosis present

## 2017-02-23 DIAGNOSIS — G8918 Other acute postprocedural pain: Secondary | ICD-10-CM

## 2017-02-23 DIAGNOSIS — Z96641 Presence of right artificial hip joint: Secondary | ICD-10-CM | POA: Diagnosis not present

## 2017-02-23 DIAGNOSIS — I10 Essential (primary) hypertension: Secondary | ICD-10-CM | POA: Diagnosis present

## 2017-02-23 DIAGNOSIS — Z419 Encounter for procedure for purposes other than remedying health state, unspecified: Secondary | ICD-10-CM

## 2017-02-23 DIAGNOSIS — I129 Hypertensive chronic kidney disease with stage 1 through stage 4 chronic kidney disease, or unspecified chronic kidney disease: Secondary | ICD-10-CM | POA: Diagnosis not present

## 2017-02-23 DIAGNOSIS — Z471 Aftercare following joint replacement surgery: Secondary | ICD-10-CM | POA: Diagnosis not present

## 2017-02-23 HISTORY — PX: TOTAL HIP ARTHROPLASTY: SHX124

## 2017-02-23 LAB — CBC
HEMATOCRIT: 44.1 % (ref 40.0–52.0)
Hemoglobin: 14.8 g/dL (ref 13.0–18.0)
MCH: 31.7 pg (ref 26.0–34.0)
MCHC: 33.6 g/dL (ref 32.0–36.0)
MCV: 94.3 fL (ref 80.0–100.0)
PLATELETS: 180 10*3/uL (ref 150–440)
RBC: 4.68 MIL/uL (ref 4.40–5.90)
RDW: 14.2 % (ref 11.5–14.5)
WBC: 12.9 10*3/uL — AB (ref 3.8–10.6)

## 2017-02-23 LAB — URINE CULTURE: CULTURE: NO GROWTH

## 2017-02-23 LAB — CREATININE, SERUM
Creatinine, Ser: 1.06 mg/dL (ref 0.61–1.24)
GFR calc non Af Amer: >60 mL/min (ref >60)

## 2017-02-23 LAB — POTASSIUM: Potassium: 3.3 mmol/L — ABNORMAL LOW (ref 3.5–5.1)

## 2017-02-23 LAB — ABO/RH: ABO/RH(D): O POS

## 2017-02-23 SURGERY — ARTHROPLASTY, HIP, TOTAL, ANTERIOR APPROACH
Anesthesia: Spinal | Laterality: Right | Wound class: Clean

## 2017-02-23 MED ORDER — TRAZODONE HCL 50 MG PO TABS
25.0000 mg | ORAL_TABLET | Freq: Every evening | ORAL | Status: DC | PRN
  Administered 2017-02-23: 50 mg via ORAL
  Filled 2017-02-23: qty 1

## 2017-02-23 MED ORDER — BUPIVACAINE HCL (PF) 0.5 % IJ SOLN
INTRAMUSCULAR | Status: DC | PRN
  Administered 2017-02-23: 3 mL

## 2017-02-23 MED ORDER — LACTATED RINGERS IV SOLN
INTRAVENOUS | Status: DC
  Administered 2017-02-23 (×2): via INTRAVENOUS

## 2017-02-23 MED ORDER — POTASSIUM CHLORIDE CRYS ER 10 MEQ PO TBCR
10.0000 meq | EXTENDED_RELEASE_TABLET | Freq: Every day | ORAL | Status: DC
  Administered 2017-02-23: 10 meq via ORAL
  Filled 2017-02-23 (×2): qty 1

## 2017-02-23 MED ORDER — METHOCARBAMOL 1000 MG/10ML IJ SOLN
500.0000 mg | Freq: Four times a day (QID) | INTRAVENOUS | Status: DC | PRN
  Filled 2017-02-23: qty 5

## 2017-02-23 MED ORDER — METOCLOPRAMIDE HCL 5 MG/ML IJ SOLN: 5.0000 mg | Freq: Three times a day (TID) | INTRAMUSCULAR | Status: DC | PRN

## 2017-02-23 MED ORDER — DIPHENHYDRAMINE HCL 12.5 MG/5ML PO ELIX: 12.5000 mg | ORAL_SOLUTION | ORAL | Status: DC | PRN

## 2017-02-23 MED ORDER — MAGNESIUM HYDROXIDE 400 MG/5ML PO SUSP
30.0000 mL | Freq: Every day | ORAL | Status: DC | PRN
  Administered 2017-02-23: 30 mL via ORAL
  Filled 2017-02-23: qty 30

## 2017-02-23 MED ORDER — ONDANSETRON HCL 4 MG PO TABS: 4.0000 mg | ORAL_TABLET | Freq: Four times a day (QID) | ORAL | Status: DC | PRN

## 2017-02-23 MED ORDER — FAMOTIDINE 20 MG PO TABS
ORAL_TABLET | ORAL | Status: AC
  Filled 2017-02-23: qty 1

## 2017-02-23 MED ORDER — TAMSULOSIN HCL 0.4 MG PO CAPS
0.4000 mg | ORAL_CAPSULE | Freq: Every day | ORAL | Status: DC
  Administered 2017-02-23 – 2017-02-24 (×2): 0.4 mg via ORAL
  Filled 2017-02-23 (×2): qty 1

## 2017-02-23 MED ORDER — HYDROCHLOROTHIAZIDE 25 MG PO TABS
25.0000 mg | ORAL_TABLET | Freq: Every day | ORAL | Status: DC
  Administered 2017-02-24 – 2017-02-25 (×2): 25 mg via ORAL
  Filled 2017-02-23 (×2): qty 1

## 2017-02-23 MED ORDER — PROPOFOL 10 MG/ML IV BOLUS
INTRAVENOUS | Status: DC | PRN
  Administered 2017-02-23 (×4): 10 mg via INTRAVENOUS

## 2017-02-23 MED ORDER — ONDANSETRON HCL 4 MG/2ML IJ SOLN
4.0000 mg | Freq: Four times a day (QID) | INTRAMUSCULAR | Status: DC | PRN
  Administered 2017-02-23: 4 mg via INTRAVENOUS
  Filled 2017-02-23: qty 2

## 2017-02-23 MED ORDER — MORPHINE SULFATE (PF) 2 MG/ML IV SOLN
2.0000 mg | INTRAVENOUS | Status: DC | PRN
  Administered 2017-02-23: 2 mg via INTRAVENOUS
  Filled 2017-02-23: qty 1

## 2017-02-23 MED ORDER — FLUTICASONE PROPIONATE 50 MCG/ACT NA SUSP
2.0000 | Freq: Every day | NASAL | Status: DC
  Administered 2017-02-24: 2 via NASAL
  Filled 2017-02-23: qty 16

## 2017-02-23 MED ORDER — PROPOFOL 500 MG/50ML IV EMUL
INTRAVENOUS | Status: DC | PRN
  Administered 2017-02-23: 30 ug/kg/min via INTRAVENOUS
  Administered 2017-02-23: 25 ug/kg/min via INTRAVENOUS
  Administered 2017-02-23: 40 ug/kg/min via INTRAVENOUS

## 2017-02-23 MED ORDER — SODIUM CHLORIDE 0.9 % IV SOLN
INTRAVENOUS | Status: DC
  Administered 2017-02-23: 16:00:00 via INTRAVENOUS

## 2017-02-23 MED ORDER — ASPIRIN EC 81 MG PO TBEC
81.0000 mg | DELAYED_RELEASE_TABLET | Freq: Every day | ORAL | Status: DC
Start: 2017-02-24 — End: 2017-02-25
  Administered 2017-02-24 – 2017-02-25 (×2): 81 mg via ORAL
  Filled 2017-02-23 (×2): qty 1

## 2017-02-23 MED ORDER — KETAMINE HCL 50 MG/ML IJ SOLN
INTRAMUSCULAR | Status: AC
  Filled 2017-02-23: qty 10

## 2017-02-23 MED ORDER — OXYCODONE HCL 5 MG PO TABS
5.0000 mg | ORAL_TABLET | ORAL | Status: DC | PRN
  Administered 2017-02-23: 10 mg via ORAL
  Filled 2017-02-23: qty 2

## 2017-02-23 MED ORDER — FENTANYL CITRATE (PF) 100 MCG/2ML IJ SOLN: 25.0000 ug | INTRAMUSCULAR | Status: DC | PRN

## 2017-02-23 MED ORDER — ACETAMINOPHEN 650 MG RE SUPP: 650.0000 mg | Freq: Four times a day (QID) | RECTAL | Status: DC | PRN

## 2017-02-23 MED ORDER — PHENYLEPHRINE HCL 10 MG/ML IJ SOLN
INTRAMUSCULAR | Status: DC | PRN
  Administered 2017-02-23: 100 ug via INTRAVENOUS

## 2017-02-23 MED ORDER — BUPIVACAINE HCL (PF) 0.5 % IJ SOLN
INTRAMUSCULAR | Status: AC
  Filled 2017-02-23: qty 10

## 2017-02-23 MED ORDER — SENNOSIDES-DOCUSATE SODIUM 8.6-50 MG PO TABS
1.0000 | ORAL_TABLET | Freq: Two times a day (BID) | ORAL | Status: DC
  Administered 2017-02-23 – 2017-02-25 (×4): 1 via ORAL
  Filled 2017-02-23 (×4): qty 1

## 2017-02-23 MED ORDER — PHENOL 1.4 % MT LIQD
1.0000 | OROMUCOSAL | Status: DC | PRN
  Filled 2017-02-23: qty 177

## 2017-02-23 MED ORDER — NEOMYCIN-POLYMYXIN B GU 40-200000 IR SOLN
Status: AC
  Filled 2017-02-23: qty 4

## 2017-02-23 MED ORDER — PROPOFOL 10 MG/ML IV BOLUS
INTRAVENOUS | Status: AC
  Filled 2017-02-23: qty 20

## 2017-02-23 MED ORDER — NEOMYCIN-POLYMYXIN B GU 40-200000 IR SOLN
Status: DC | PRN
  Administered 2017-02-23: 4 mL

## 2017-02-23 MED ORDER — FAMOTIDINE 20 MG PO TABS
20.0000 mg | ORAL_TABLET | Freq: Once | ORAL | Status: AC
Start: 2017-02-23 — End: 2017-02-23
  Administered 2017-02-23: 20 mg via ORAL

## 2017-02-23 MED ORDER — METHOCARBAMOL 500 MG PO TABS
500.0000 mg | ORAL_TABLET | Freq: Four times a day (QID) | ORAL | Status: DC | PRN
Start: 2017-02-23 — End: 2017-02-25
  Administered 2017-02-23 – 2017-02-24 (×2): 500 mg via ORAL
  Filled 2017-02-23 (×2): qty 1

## 2017-02-23 MED ORDER — FENTANYL CITRATE (PF) 100 MCG/2ML IJ SOLN
INTRAMUSCULAR | Status: DC | PRN
  Administered 2017-02-23: 25 ug via INTRAVENOUS

## 2017-02-23 MED ORDER — MAGNESIUM CITRATE PO SOLN
1.0000 | Freq: Once | ORAL | Status: DC | PRN
  Filled 2017-02-23: qty 296

## 2017-02-23 MED ORDER — CHOLECALCIFEROL 10 MCG (400 UNIT) PO TABS
400.0000 [IU] | ORAL_TABLET | Freq: Every day | ORAL | Status: DC
  Administered 2017-02-24 – 2017-02-25 (×2): 400 [IU] via ORAL
  Filled 2017-02-23 (×2): qty 1

## 2017-02-23 MED ORDER — VASOPRESSIN 20 UNIT/ML IV SOLN
INTRAVENOUS | Status: DC | PRN
  Administered 2017-02-23: 20 [IU] via INTRAVENOUS

## 2017-02-23 MED ORDER — KETAMINE HCL 10 MG/ML IJ SOLN
INTRAMUSCULAR | Status: DC | PRN
  Administered 2017-02-23 (×3): 5 mg via INTRAVENOUS

## 2017-02-23 MED ORDER — MENTHOL 3 MG MT LOZG
1.0000 | LOZENGE | OROMUCOSAL | Status: DC | PRN
  Filled 2017-02-23: qty 9

## 2017-02-23 MED ORDER — ACETAMINOPHEN 325 MG PO TABS
650.0000 mg | ORAL_TABLET | Freq: Four times a day (QID) | ORAL | Status: DC | PRN
  Administered 2017-02-24 – 2017-02-25 (×2): 650 mg via ORAL
  Filled 2017-02-23 (×3): qty 2

## 2017-02-23 MED ORDER — CEFAZOLIN SODIUM-DEXTROSE 2-4 GM/100ML-% IV SOLN
INTRAVENOUS | Status: AC
  Filled 2017-02-23: qty 100

## 2017-02-23 MED ORDER — ONDANSETRON HCL 4 MG/2ML IJ SOLN: 4.0000 mg | Freq: Once | INTRAMUSCULAR | Status: DC | PRN

## 2017-02-23 MED ORDER — SODIUM CHLORIDE 0.9 % IV SOLN
INTRAVENOUS | Status: DC | PRN
  Administered 2017-02-23: 15 ug/min via INTRAVENOUS

## 2017-02-23 MED ORDER — BISACODYL 10 MG RE SUPP
10.0000 mg | Freq: Every day | RECTAL | Status: DC | PRN
  Administered 2017-02-25: 10 mg via RECTAL
  Filled 2017-02-23: qty 1

## 2017-02-23 MED ORDER — HYDROCODONE-ACETAMINOPHEN 7.5-325 MG PO TABS
1.0000 | ORAL_TABLET | ORAL | Status: DC | PRN
  Administered 2017-02-23 – 2017-02-25 (×5): 1 via ORAL
  Filled 2017-02-23 (×5): qty 1

## 2017-02-23 MED ORDER — BUPIVACAINE-EPINEPHRINE (PF) 0.25% -1:200000 IJ SOLN
INTRAMUSCULAR | Status: AC
  Filled 2017-02-23: qty 30

## 2017-02-23 MED ORDER — VASOPRESSIN 20 UNIT/ML IV SOLN
INTRAVENOUS | Status: AC
  Filled 2017-02-23: qty 1

## 2017-02-23 MED ORDER — FENTANYL CITRATE (PF) 100 MCG/2ML IJ SOLN
INTRAMUSCULAR | Status: AC
  Filled 2017-02-23: qty 2

## 2017-02-23 MED ORDER — METOCLOPRAMIDE HCL 10 MG PO TABS: 5.0000 mg | ORAL_TABLET | Freq: Three times a day (TID) | ORAL | Status: DC | PRN

## 2017-02-23 MED ORDER — ROPINIROLE HCL 1 MG PO TABS
3.0000 mg | ORAL_TABLET | Freq: Every day | ORAL | Status: DC
  Administered 2017-02-23 – 2017-02-24 (×2): 3 mg via ORAL
  Filled 2017-02-23 (×2): qty 3

## 2017-02-23 MED ORDER — CEFAZOLIN SODIUM-DEXTROSE 2-4 GM/100ML-% IV SOLN
2.0000 g | Freq: Four times a day (QID) | INTRAVENOUS | Status: AC
  Administered 2017-02-23 – 2017-02-24 (×3): 2 g via INTRAVENOUS
  Filled 2017-02-23 (×4): qty 100

## 2017-02-23 MED ORDER — ZOLPIDEM TARTRATE 5 MG PO TABS: 5.0000 mg | ORAL_TABLET | Freq: Every evening | ORAL | Status: DC | PRN

## 2017-02-23 MED ORDER — BUPIVACAINE-EPINEPHRINE 0.25% -1:200000 IJ SOLN
INTRAMUSCULAR | Status: DC | PRN
  Administered 2017-02-23: 30 mL

## 2017-02-23 MED ORDER — ENOXAPARIN SODIUM 40 MG/0.4ML ~~LOC~~ SOLN
40.0000 mg | SUBCUTANEOUS | Status: DC
  Administered 2017-02-24 – 2017-02-25 (×2): 40 mg via SUBCUTANEOUS
  Filled 2017-02-23 (×2): qty 0.4

## 2017-02-23 MED ORDER — LORAZEPAM 0.5 MG PO TABS
0.5000 mg | ORAL_TABLET | Freq: Every evening | ORAL | Status: DC | PRN
  Administered 2017-02-23 – 2017-02-24 (×2): 1 mg via ORAL
  Filled 2017-02-23 (×2): qty 2

## 2017-02-23 MED ORDER — LIDOCAINE HCL (PF) 2 % IJ SOLN
INTRAMUSCULAR | Status: AC
  Filled 2017-02-23: qty 2

## 2017-02-23 SURGICAL SUPPLY — 47 items
BLADE SAW SAG 18.5X105 (BLADE) ×2 IMPLANT
BNDG COHESIVE 6X5 TAN STRL LF (GAUZE/BANDAGES/DRESSINGS) ×4 IMPLANT
CANISTER SUCT 1200ML W/VALVE (MISCELLANEOUS) ×2 IMPLANT
CAPT HIP TOTAL 3 ×1 IMPLANT
CATH FOL LEG HOLDER (MISCELLANEOUS) ×2 IMPLANT
CATH TRAY METER 16FR LF (MISCELLANEOUS) ×2 IMPLANT
CHLORAPREP W/TINT 26ML (MISCELLANEOUS) ×2 IMPLANT
DRAPE C-ARM XRAY 36X54 (DRAPES) ×2 IMPLANT
DRAPE INCISE IOBAN 66X60 STRL (DRAPES) IMPLANT
DRAPE POUCH INSTRU U-SHP 10X18 (DRAPES) ×2 IMPLANT
DRAPE SHEET LG 3/4 BI-LAMINATE (DRAPES) ×6 IMPLANT
DRAPE TABLE BACK 80X90 (DRAPES) ×2 IMPLANT
DRESSING SURGICEL FIBRLLR 1X2 (HEMOSTASIS) ×2 IMPLANT
DRSG OPSITE POSTOP 4X8 (GAUZE/BANDAGES/DRESSINGS) ×4 IMPLANT
DRSG SURGICEL FIBRILLAR 1X2 (HEMOSTASIS) ×4
ELECT BLADE 6.5 EXT (BLADE) ×2 IMPLANT
ELECT REM PT RETURN 9FT ADLT (ELECTROSURGICAL) ×2
ELECTRODE REM PT RTRN 9FT ADLT (ELECTROSURGICAL) ×1 IMPLANT
GLOVE BIOGEL PI IND STRL 9 (GLOVE) ×1 IMPLANT
GLOVE BIOGEL PI INDICATOR 9 (GLOVE) ×1
GLOVE SURG SYN 9.0  PF PI (GLOVE) ×2
GLOVE SURG SYN 9.0 PF PI (GLOVE) ×2 IMPLANT
GOWN SRG 2XL LVL 4 RGLN SLV (GOWNS) ×1 IMPLANT
GOWN STRL NON-REIN 2XL LVL4 (GOWNS) ×2
GOWN STRL REUS W/ TWL LRG LVL3 (GOWN DISPOSABLE) ×1 IMPLANT
GOWN STRL REUS W/TWL LRG LVL3 (GOWN DISPOSABLE) ×2
HEMOVAC 400CC 10FR (MISCELLANEOUS) IMPLANT
HOOD PEEL AWAY FLYTE STAYCOOL (MISCELLANEOUS) ×2 IMPLANT
MAT BLUE FLOOR 46X72 FLO (MISCELLANEOUS) ×2 IMPLANT
NDL SAFETY 18GX1.5 (NEEDLE) ×2 IMPLANT
NDL SPNL 18GX3.5 QUINCKE PK (NEEDLE) ×1 IMPLANT
NEEDLE SPNL 18GX3.5 QUINCKE PK (NEEDLE) ×2 IMPLANT
NS IRRIG 1000ML POUR BTL (IV SOLUTION) ×2 IMPLANT
PACK HIP COMPR (MISCELLANEOUS) ×2 IMPLANT
SOL PREP PVP 2OZ (MISCELLANEOUS) ×2
SOLUTION PREP PVP 2OZ (MISCELLANEOUS) ×1 IMPLANT
STAPLER SKIN PROX 35W (STAPLE) ×2 IMPLANT
STRAP SAFETY BODY (MISCELLANEOUS) ×2 IMPLANT
SUT DVC 2 QUILL PDO  T11 36X36 (SUTURE) ×1
SUT DVC 2 QUILL PDO T11 36X36 (SUTURE) ×1 IMPLANT
SUT SILK 0 (SUTURE) ×2
SUT SILK 0 30XBRD TIE 6 (SUTURE) ×1 IMPLANT
SUT V-LOC 90 ABS DVC 3-0 CL (SUTURE) ×2 IMPLANT
SUT VIC AB 1 CT1 36 (SUTURE) ×2 IMPLANT
SYR 20CC LL (SYRINGE) ×2 IMPLANT
SYR 30ML LL (SYRINGE) ×2 IMPLANT
TOWEL OR 17X26 4PK STRL BLUE (TOWEL DISPOSABLE) ×2 IMPLANT

## 2017-02-23 NOTE — Progress Notes (Signed)
Admission Note:  Patient alert and oriented. Patient not feeling any pain and decreased sensation. Patient able to move legs. Lung and heart sounds normal. Patient is deaf and his right ear. Patient has honeycomb dressing that is clean dry and intact. Patient oriented to room and call bell system.  Deri Fuelling, RN

## 2017-02-23 NOTE — Transfer of Care (Signed)
Immediate Anesthesia Transfer of Care Note  Patient: Gerald Hall  Procedure(s) Performed: Procedure(s): TOTAL HIP ARTHROPLASTY ANTERIOR APPROACH (Right)  Patient Location: PACU  Anesthesia Type:MAC  Level of Consciousness: awake  Airway & Oxygen Therapy: Patient Spontanous Breathing and Patient connected to nasal cannula oxygen  Post-op Assessment: Report given to RN and Post -op Vital signs reviewed and stable  Post vital signs: Reviewed and stable  Last Vitals:  Vitals:   02/23/17 1008  BP: 135/69  Pulse: 71  Resp: 14  Temp: 36.9 C    Last Pain:  Vitals:   02/23/17 1008  TempSrc: Oral         Complications: No apparent anesthesia complications

## 2017-02-23 NOTE — Anesthesia Procedure Notes (Signed)
Procedure Name: MAC Date/Time: 02/23/2017 11:50 AM Performed by: Allean Found Pre-anesthesia Checklist: Patient identified, Emergency Drugs available, Suction available, Patient being monitored and Timeout performed Patient Re-evaluated:Patient Re-evaluated prior to inductionOxygen Delivery Method: Nasal cannula Placement Confirmation: positive ETCO2

## 2017-02-23 NOTE — Anesthesia Procedure Notes (Signed)
Spinal  Patient location during procedure: OR Start time: 02/23/2017 11:47 AM End time: 02/23/2017 11:49 AM Staffing Anesthesiologist: KARENZ, ANDREW Performed: anesthesiologist  Preanesthetic Checklist Completed: patient identified, site marked, surgical consent, pre-op evaluation, timeout performed, IV checked, risks and benefits discussed and monitors and equipment checked Spinal Block Patient position: sitting Prep: ChloraPrep Patient monitoring: heart rate, continuous pulse ox, blood pressure and cardiac monitor Approach: midline Location: L3-4 Injection technique: single-shot Needle Needle type: Whitacre and Introducer  Needle gauge: 24 G Needle length: 9 cm Assessment Sensory level: T10 Additional Notes Negative paresthesia. Negative blood return. Positive free-flowing CSF. Expiration date of kit checked and confirmed. Patient tolerated procedure well, without complications.       

## 2017-02-23 NOTE — H&P (Signed)
Reviewed paper H+P, will be scanned into chart. Patient examined No changes noted.  

## 2017-02-23 NOTE — Anesthesia Post-op Follow-up Note (Cosign Needed)
Anesthesia QCDR form completed.        

## 2017-02-23 NOTE — Op Note (Signed)
02/23/2017  1:18 PM  PATIENT:  Gerald Hall  82 y.o. male  PRE-OPERATIVE DIAGNOSIS:  AVASCULAR NECROSIS OF BONE RIGHT HIP  POST-OPERATIVE DIAGNOSIS:  AVASCULAR NECROSIS OF BONE RIGHT HIP  PROCEDURE:  Procedure(s): TOTAL HIP ARTHROPLASTY ANTERIOR APPROACH (Right)  SURGEON: Laurene Footman, MD  ASSISTANTS: None  ANESTHESIA:   spinal  EBL:  Total I/O In: 1310 [I.V.:910; IV Piggyback:400] Out: 750 [Urine:400; Blood:350]  BLOOD ADMINISTERED:none  DRAINS: none   LOCAL MEDICATIONS USED:  MARCAINE     SPECIMEN:  Source of Specimen:  Right femoral head  DISPOSITION OF SPECIMEN:  PATHOLOGY  COUNTS:  YES  TOURNIQUET:  * No tourniquets in log *  IMPLANTS: Medacta AMIS 4 standard stem, 56 mm Mpact DM with liner and L 28 mm head  DICTATION: .Dragon Dictation   The patient was brought to the operating room and after spinal anesthesia was obtained patient was placed on the operative table with the ipsilateral foot into the Medacta attachment, contralateral leg on a well-padded table. C-arm was brought in and preop template x-ray taken. After prepping and draping in usual sterile fashion appropriate patient identification and timeout procedures were completed. Anterior approach to the hip was obtained and centered over the greater trochanter and TFL muscle. The subcutaneous tissue was incised hemostasis being achieved by electrocautery. TFL fascia was incised and the muscle retracted laterally deep retractor placed. The lateral femoral circumflex vessels were identified and ligated. The anterior capsule was exposed and a capsulotomy performed. The neck was identified and a femoral neck cut carried out with a saw. The head was removed without difficulty and showed sclerotic femoral head and acetabulum. Reaming was carried out to 54 mm and a 56 mm cup trial gave appropriate tightness to the acetabular component a 56 DM cup was impacted into position. The leg was then externally rotated and  ischiofemoral and pubofemoral releases carried out. The femur was sequentially broached to a size 4, size 4 standard with L head trials were placed and the final components chosen. The 4 standard stem was inserted along with a L 28 mm head and 56 mm liner. The hip was reduced and was stable the wound was thoroughly irrigate. The deep fascia was closed using a heavy Quill after infiltration of 30 cc of quarter percent Sensorcaine with epinephrine. 3-0 v-loc to close the skin with skin staples Xeroform and honeycomb dressing applied  PLAN OF CARE: Admit to inpatient

## 2017-02-23 NOTE — Anesthesia Preprocedure Evaluation (Signed)
Anesthesia Evaluation  Patient identified by MRN, date of birth, ID band Patient awake    Reviewed: Allergy & Precautions, H&P , NPO status , Patient's Chart, lab work & pertinent test results, reviewed documented beta blocker date and time   History of Anesthesia Complications Negative for: history of anesthetic complications  Airway Mallampati: III  TM Distance: >3 FB Neck ROM: full    Dental  (+) Partial Upper, Partial Lower, Caps, Dental Advidsory Given, Missing, Poor Dentition   Pulmonary neg shortness of breath, neg sleep apnea, neg COPD, neg recent URI, Current Smoker,           Cardiovascular Exercise Tolerance: Good hypertension, (-) angina+ Peripheral Vascular Disease  (-) CAD, (-) Past MI, (-) Cardiac Stents and (-) CABG (-) dysrhythmias (-) Valvular Problems/Murmurs     Neuro/Psych negative neurological ROS  negative psych ROS   GI/Hepatic negative GI ROS, Neg liver ROS,   Endo/Other  negative endocrine ROS  Renal/GU CRFRenal disease  negative genitourinary   Musculoskeletal   Abdominal   Peds  Hematology negative hematology ROS (+)   Anesthesia Other Findings Past Medical History: No date: Arthritis No date: Balance problem     Comment: uses a walker No date: Blood in urine     Comment: Sees urology yearly age 82: Deafness in right ear No date: Hyperlipidemia No date: Hypertension 2016: Temporal arteritis (HCC) No date: Urinary incontinence   Reproductive/Obstetrics negative OB ROS                             Anesthesia Physical Anesthesia Plan  ASA: III  Anesthesia Plan: Spinal   Post-op Pain Management:    Induction:   Airway Management Planned:   Additional Equipment:   Intra-op Plan:   Post-operative Plan:   Informed Consent: I have reviewed the patients History and Physical, chart, labs and discussed the procedure including the risks, benefits and  alternatives for the proposed anesthesia with the patient or authorized representative who has indicated his/her understanding and acceptance.   Dental Advisory Given  Plan Discussed with: Anesthesiologist, CRNA and Surgeon  Anesthesia Plan Comments:         Anesthesia Quick Evaluation

## 2017-02-24 LAB — BASIC METABOLIC PANEL
Anion gap: 5 (ref 5–15)
BUN: 16 mg/dL (ref 6–20)
CO2: 31 mmol/L (ref 22–32)
CREATININE: 1.13 mg/dL (ref 0.61–1.24)
Calcium: 8.6 mg/dL — ABNORMAL LOW (ref 8.9–10.3)
Chloride: 103 mmol/L (ref 101–111)
GFR, EST NON AFRICAN AMERICAN: 60 mL/min — AB (ref >60)
Glucose, Bld: 114 mg/dL — ABNORMAL HIGH (ref 65–99)
POTASSIUM: 3 mmol/L — AB (ref 3.5–5.1)
SODIUM: 139 mmol/L (ref 135–145)

## 2017-02-24 LAB — CBC
HEMATOCRIT: 41.9 % (ref 40.0–52.0)
Hemoglobin: 14.1 g/dL (ref 13.0–18.0)
MCH: 32.1 pg (ref 26.0–34.0)
MCHC: 33.8 g/dL (ref 32.0–36.0)
MCV: 95 fL (ref 80.0–100.0)
PLATELETS: 159 10*3/uL (ref 150–440)
RBC: 4.41 MIL/uL (ref 4.40–5.90)
RDW: 14.1 % (ref 11.5–14.5)
WBC: 14.1 10*3/uL — AB (ref 3.8–10.6)

## 2017-02-24 MED ORDER — POTASSIUM CHLORIDE 20 MEQ PO PACK
20.0000 meq | PACK | Freq: Three times a day (TID) | ORAL | Status: AC
  Administered 2017-02-24 (×3): 20 meq via ORAL
  Filled 2017-02-24 (×3): qty 1

## 2017-02-24 MED ORDER — POTASSIUM CHLORIDE CRYS ER 10 MEQ PO TBCR
10.0000 meq | EXTENDED_RELEASE_TABLET | Freq: Three times a day (TID) | ORAL | Status: DC
  Administered 2017-02-24 – 2017-02-25 (×5): 10 meq via ORAL
  Filled 2017-02-24 (×5): qty 1

## 2017-02-24 MED ORDER — MAGNESIUM HYDROXIDE 400 MG/5ML PO SUSP
30.0000 mL | Freq: Every day | ORAL | Status: DC | PRN
  Administered 2017-02-24: 30 mL via ORAL
  Filled 2017-02-24: qty 30

## 2017-02-24 NOTE — Progress Notes (Signed)
Clinical Social Worker (CSW) received SNF consult. PT is recommending home health. RN case manager aware of above. Please reconsult if future social work needs arise. CSW signing off.   Luka Reisch, LCSW [phone] 

## 2017-02-24 NOTE — Progress Notes (Signed)
Tolerated sitting on side of bed. Pt got a little confused after having morphine. dsg dry and intact. Pain controlled.

## 2017-02-24 NOTE — Anesthesia Postprocedure Evaluation (Signed)
Anesthesia Post Note  Patient: [name]  Procedure(s) Performed: Procedure(s) (LRB): TOTAL HIP ARTHROPLASTY ANTERIOR APPROACH (Right)  Patient location during evaluation: Nursing Unit Anesthesia Type: Spinal Level of consciousness: awake, awake and alert and oriented Pain management: pain level controlled Vital Signs Assessment: post-procedure vital signs reviewed and stable Respiratory status: spontaneous breathing, nonlabored ventilation and respiratory function stable Cardiovascular status: blood pressure returned to baseline and stable Postop Assessment: no headache and no backache Anesthetic complications: no     Last Vitals:  Vitals:   02/24/17 0000 02/24/17 0352  BP: (!) 144/82 (!) 146/81  Pulse: 85 90  Resp:  18  Temp:  36.8 C    Last Pain:  Vitals:   02/24/17 0352  TempSrc: Oral  PainSc:                  [name]

## 2017-02-24 NOTE — Progress Notes (Signed)
Patient requested to receive requip early, will alert on coming nurse

## 2017-02-24 NOTE — Care Management (Signed)
RNCM consult for discharge planning. Have made multiple attempts to see patient and he has had staff working with him. Will follow up

## 2017-02-24 NOTE — Progress Notes (Signed)
   Subjective: 1 Day Post-Op Procedure(s) (LRB): TOTAL HIP ARTHROPLASTY ANTERIOR APPROACH (Right) Patient reports pain as 5 on 0-10 scale.   Patient is well, and has had no acute complaints or problems Denies any CP, SOB, ABD pain. We will continue therapy today.  Plan is to go Home after hospital stay.  Objective: Vital signs in last 24 hours: Temp:  [96.9 F (36.1 C)-99.5 F (37.5 C)] 98.8 F (37.1 C) (05/03 0738) Pulse Rate:  [66-92] 84 (05/03 0738) Resp:  [10-20] 18 (05/03 0352) BP: (112-169)/(51-95) 135/67 (05/03 0738) SpO2:  [94 %-100 %] 94 % (05/03 0738) Weight:  [78.9 kg (174 lb)] 78.9 kg (174 lb) (05/02 1008)  Intake/Output from previous day: 05/02 0701 - 05/03 0700 In: 3036.3 [I.V.:2136.3; IV Piggyback:900] Out: 2300 [SKAJG:8115; Blood:350] Intake/Output this shift: No intake/output data recorded.   Recent Labs  02/22/17 1314 02/23/17 1547 02/24/17 0500  HGB 16.4 14.8 14.1    Recent Labs  02/23/17 1547 02/24/17 0500  WBC 12.9* 14.1*  RBC 4.68 4.41  HCT 44.1 41.9  PLT 180 159    Recent Labs  02/22/17 1314 02/23/17 1017 02/23/17 1547 02/24/17 0500  NA 139  --   --  139  K 3.1* 3.3*  --  3.0*  CL 99*  --   --  103  CO2 31  --   --  31  BUN 19  --   --  16  CREATININE 1.20  --  1.06 1.13  GLUCOSE 91  --   --  114*  CALCIUM 9.5  --   --  8.6*    Recent Labs  02/22/17 1314  INR 0.92    EXAM General - Patient is Alert, Appropriate and Oriented Extremity - Neurovascular intact Sensation intact distally Intact pulses distally Dorsiflexion/Plantar flexion intact No cellulitis present Compartment soft Dressing - dressing C/D/I and no drainage Motor Function - intact, moving foot and toes well on exam.   Past Medical History:  Diagnosis Date  . Arthritis   . Balance problem    uses a walker  . Blood in urine    Sees urology yearly  . Deafness in right ear age 66  . Hyperlipidemia   . Hypertension   . Temporal arteritis ([place]) 2016   . Urinary incontinence     Assessment/Plan:   1 Day Post-Op Procedure(s) (LRB): TOTAL HIP ARTHROPLASTY ANTERIOR APPROACH (Right) Active Problems:   Avascular necrosis of bone of right hip (HCC)   Hypokalemia  Estimated body mass index is 25.7 kg/m as calculated from the following:   Height as of this encounter: 5\' 9"  (1.753 m).   Weight as of this encounter: 78.9 kg (174 lb). Advance diet Up with therapy  Needs BM Hypokalemia - start Klor kon 20 meq TID Recheck labs in the am CM to assist with discharge   DVT Prophylaxis - Lovenox, Foot Pumps and TED hose Weight-Bearing as tolerated to right leg   T. Rachelle Hora, PA-C [place] 02/24/2017, 9:07 AM

## 2017-02-24 NOTE — Progress Notes (Signed)
Chaplain visit the Pt at 2:10 pm, but Pt had other people in his Rm at the time. CH made a follow up visit with the Pt again at 2:50 pm. CH met with Pt. Pt's daughters were at the bedside. Pt was in good spirit this afternoon. Pt stated that his surgery went well and that he was making steady progress toward recovery. Pt requested prayers, which [place] offered with graceful presence.     02/24/17 1500  Clinical Encounter Type  Visited With Patient;Patient and family together  Visit Type Initial  Referral From Nurse  Consult/Referral To Chaplain  Spiritual Encounters  Spiritual Needs Prayer

## 2017-02-24 NOTE — Evaluation (Signed)
Physical Therapy Evaluation Patient Details Name: Gerald Hall MRN: [id number] DOB: Mar 11, 1935 Today's Date: 02/24/2017   History of Present Illness  Pt is a 82 y/o M s/p R THA, direct anterior approach.  Pt's PMH includes deafness in R ear, urinary incontinence.  Clinical Impression  Pt is s/p R THA resulting in the deficits listed below (see PT Problem List). Gerald Hall was eager to work with therapy.  He currently requires min assist for bed mobility and transfers and min guard assist for ambulation.  He reports that his bathroom is narrow and thus request that the wheels on his RW be installed on the inside rather than the outside of his RW.  He will continue to have 24/7 assist/supervision available from a caregiver at d/c.  Pt will benefit from skilled PT to increase their independence and safety with mobility to allow discharge to the venue listed below.     Follow Up Recommendations Home health PT;Supervision for mobility/OOB    Equipment Recommendations  Rolling walker with 5" wheels (with wheels installed on inside of RW)    Recommendations for Other Services OT consult     Precautions / Restrictions Precautions Precautions: Fall;Other (comment) Precaution Comments: pt with urgency and incontinent of urine Restrictions Weight Bearing Restrictions: Yes RLE Weight Bearing: Weight bearing as tolerated      Mobility  Bed Mobility Overal bed mobility: Needs Assistance Bed Mobility: Supine to Sit     Supine to sit: Min assist;HOB elevated     General bed mobility comments: Pt requires cues for sequencing and min assist provided to advance RLE to EOB.  Pt uses bed rail to pull up into sitting.  Transfers Overall transfer level: Needs assistance Equipment used: Rolling walker (2 wheeled) Transfers: Sit to/from Stand Sit to Stand: Min assist         General transfer comment: Min assist to steady with sit>stand and cues for hand placement.  Pt attempts to sit  prematurely and requires cues to back up all the way to the chair and reach back for both armrests before attempting to sit.  Following cues, pt able to sit with a safe technique and min guard assist.  Ambulation/Gait Ambulation/Gait assistance: Min guard Ambulation Distance (Feet): 200 Feet Assistive device: Rolling walker (2 wheeled) Gait Pattern/deviations: Step-through pattern;Decreased stride length;Decreased weight shift to right;Decreased stance time - right;Decreased step length - left;Antalgic;Trunk flexed Gait velocity: decreased Gait velocity interpretation: Below normal speed for age/gender General Gait Details: Pt requires cues for upright posture and forward gaze.  Cues for sequencing with RW.    Stairs            Wheelchair Mobility    Modified Rankin (Stroke Patients Only)       Balance Overall balance assessment: Needs assistance Sitting-balance support: No upper extremity supported;Feet supported Sitting balance-Leahy Scale: Good     Standing balance support: Single extremity supported;During functional activity Standing balance-Leahy Scale: Poor Standing balance comment: Pt requires at least one UE support for static activities and requires BUE support for dynamic activities.                             Pertinent Vitals/Pain Pain Assessment: Faces Faces Pain Scale: Hurts little more Pain Location: R hip Pain Descriptors / Indicators: Discomfort;Grimacing;Guarding Pain Intervention(s): Limited activity within patient's tolerance;Monitored during session;Repositioned;Premedicated before session    Home Living Family/patient expects to be discharged to:: Private residence Living Arrangements: Alone (but  with caregiver 24/7) Available Help at Discharge: Personal care attendant;Available 24 hours/day Type of Home: House Home Access: Ramped entrance     Home Layout: One level Home Equipment: Walker - 4 wheels;Cane - single point;Toilet  riser;Shower seat;Grab bars - tub/shower;Hand held shower head      Prior Function Level of Independence: Independent with assistive device(s)         Comments: Pt ambulates with rollator at all times.  Denies any falls in the past 6 months.  He was working with Gerald Hall and was able to ambulate 8 minutes PTA.  He is ind with bathing and sits to shower.  He is ind with dressing.  Caregiver cooks, cleans, drives.     Hand Dominance        Extremity/Trunk Assessment   Upper Extremity Assessment Upper Extremity Assessment:  (Strength grossly at least 3/5 BUEs)    Lower Extremity Assessment Lower Extremity Assessment: RLE deficits/detail RLE Deficits / Details: Limited strength and ROM as expected s/p R THA.  R hip F and Abd strength 3-/5 but otherwise strength is grossly at least 3/5. RLE: Unable to fully assess due to pain    Cervical / Trunk Assessment Cervical / Trunk Assessment: Kyphotic  Communication   Communication:  (deaf in R ear)  Cognition Arousal/Alertness: Awake/alert Behavior During Therapy: WFL for tasks assessed/performed Overall Cognitive Status: Within Functional Limits for tasks assessed                                        General Comments General comments (skin integrity, edema, etc.): Pt reports h/o urgency with voiding so discussed with pt the benefits of establishing a toileting schedule to avoid needing to rush to the bathroom.  Pt and daughter verbalized understanding.  Just after discussing this the pt had an episode of urinary incontinence and this PT noted that there was a very small amount of blood in his urine, RN notified.    Exercises Total Joint Exercises Ankle Circles/Pumps: AROM;Both;10 reps;Supine Quad Sets: Strengthening;Both;10 reps;Supine Gluteal Sets: Strengthening;Both;10 reps;Supine Hip ABduction/ADduction: AAROM;Right;10 reps;Supine Straight Leg Raises: AAROM;Right;10 reps;Supine   Assessment/Plan    PT  Assessment Patient needs continued PT services  PT Problem List Decreased strength;Decreased range of motion;Decreased activity tolerance;Decreased balance;Decreased mobility;Decreased knowledge of use of DME;Decreased safety awareness;Decreased knowledge of precautions;Pain       PT Treatment Interventions DME instruction;Gait training;Stair training;Functional mobility training;Therapeutic activities;Therapeutic exercise;Balance training;Neuromuscular re-education;Patient/family education;Wheelchair mobility training;Modalities;Manual techniques    PT Goals (Current goals can be found in the Care Plan section)  Acute Rehab PT Goals Patient Stated Goal: "to go home" PT Goal Formulation: With patient Time For Goal Achievement: 03/10/17 Potential to Achieve Goals: Good    Frequency BID   Barriers to discharge        Co-evaluation               AM-PAC PT "6 Clicks" Daily Activity  Outcome Measure Difficulty turning over in bed (including adjusting bedclothes, sheets and blankets)?: Total Difficulty moving from lying on back to sitting on the side of the bed? : Total Difficulty sitting down on and standing up from a chair with arms (e.g., wheelchair, bedside commode, etc,.)?: Total Help needed moving to and from a bed to chair (including a wheelchair)?: A Little Help needed walking in hospital room?: A Little Help needed climbing 3-5 steps with a railing? : A Little 6  Click Score: 12    End of Session Equipment Utilized During Treatment: Gait belt Activity Tolerance: Patient tolerated treatment well Patient left: in chair;with call bell/phone within reach;with chair alarm set;with family/visitor present Nurse Communication: Mobility status;Other (comment) (urinary incontinence, need for new TED hose) PT Visit Diagnosis: Pain;Muscle weakness (generalized) (M62.81);Unsteadiness on feet (R26.81) Pain - Right/Left: Right Pain - part of body: Hip    Time: 4562-5638 PT Time  Calculation (min) (ACUTE ONLY): 48 min   Charges:   PT Evaluation $PT Eval Low Complexity: 1 Procedure PT Treatments $Gait Training: 8-22 mins $Therapeutic Exercise: 8-22 mins   PT G Codes:        Collie Siad PT, DPT 02/24/2017, 11:53 AM

## 2017-02-24 NOTE — Progress Notes (Signed)
Physical Therapy Treatment Patient Details Name: Gerald Hall MRN: [id number] DOB: 16-Jul-1935 Today's Date: 02/24/2017    History of Present Illness Pt is a 82 y/o M s/p R THA, direct anterior approach.  Pt's PMH includes deafness in R ear, urinary incontinence.    PT Comments    Mr. Calender continues to be very motivated to reach mobility goals.  He completed therapeutic exercises in supine and seated.  He ambulated 80 ft this afternoon, limited by pain and fatigue.   Pt will benefit from continued skilled PT services to increase functional independence and safety.   Follow Up Recommendations  Home health PT;Supervision for mobility/OOB     Equipment Recommendations  Rolling walker with 5" wheels (with wheels installed on inside of RW)    Recommendations for Other Services OT consult     Precautions / Restrictions Precautions Precautions: Fall;Other (comment) Precaution Comments: pt with urgency and incontinent of urine Restrictions Weight Bearing Restrictions: Yes RLE Weight Bearing: Weight bearing as tolerated    Mobility  Bed Mobility Overal bed mobility: Needs Assistance Bed Mobility: Supine to Sit;Sit to Supine     Supine to sit: Min assist;HOB elevated Sit to supine: Min assist   General bed mobility comments: Pt attempted leg hook technique without success and required min assist to bring RLE into/OOB.  Transfers Overall transfer level: Needs assistance Equipment used: Rolling walker (2 wheeled) Transfers: Sit to/from Stand Sit to Stand: Min guard         General transfer comment: Pt is slow to stand and requires cues for hand placement.  Poor eccentric control when sitting on bed.  Ambulation/Gait Ambulation/Gait assistance: Min guard Ambulation Distance (Feet): 80 Feet Assistive device: Rolling walker (2 wheeled) Gait Pattern/deviations: Step-through pattern;Decreased stride length;Decreased weight shift to right;Decreased stance time -  right;Decreased step length - left;Antalgic;Trunk flexed Gait velocity: decreased Gait velocity interpretation: Below normal speed for age/gender General Gait Details: Cues for upright posture and forward gaze which pt has difficulty maintaining.  Demonstration and cues for R heel strike and toe off.  Limited ambulatory distance this afternoon due to fatigue and pain.   Stairs            Wheelchair Mobility    Modified Rankin (Stroke Patients Only)       Balance Overall balance assessment: Needs assistance Sitting-balance support: No upper extremity supported;Feet supported Sitting balance-Leahy Scale: Good     Standing balance support: Single extremity supported;During functional activity Standing balance-Leahy Scale: Poor Standing balance comment: Pt requires at least one UE support for static activities and requires BUE support for dynamic activities.                            Cognition Arousal/Alertness: Awake/alert Behavior During Therapy: WFL for tasks assessed/performed Overall Cognitive Status: Within Functional Limits for tasks assessed                                        Exercises Total Joint Exercises Ankle Circles/Pumps: AROM;Both;10 reps;Supine Quad Sets: Strengthening;Both;10 reps;Supine Heel Slides: Strengthening;Right;10 reps;AAROM;Supine Hip ABduction/ADduction: AAROM;Right;10 reps;Supine Straight Leg Raises: AAROM;Right;10 reps;Supine Long Arc Quad: Strengthening;Right;10 reps;Seated    General Comments General comments (skin integrity, edema, etc.): Two daughters present during session.      Pertinent Vitals/Pain Pain Assessment: 0-10 Pain Score: 5  (3-4 after ambulating) Pain Location: R hip Pain  Descriptors / Indicators: Discomfort;Grimacing;Guarding Pain Intervention(s): Limited activity within patient's tolerance;Monitored during session;Repositioned    Home Living                      Prior  Function            PT Goals (current goals can now be found in the care plan section) Acute Rehab PT Goals Patient Stated Goal: "to go home" PT Goal Formulation: With patient Time For Goal Achievement: 03/10/17 Potential to Achieve Goals: Good Progress towards PT goals: Progressing toward goals    Frequency    BID      PT Plan Current plan remains appropriate    Co-evaluation              AM-PAC PT "6 Clicks" Daily Activity  Outcome Measure  Difficulty turning over in bed (including adjusting bedclothes, sheets and blankets)?: Total Difficulty moving from lying on back to sitting on the side of the bed? : Total Difficulty sitting down on and standing up from a chair with arms (e.g., wheelchair, bedside commode, etc,.)?: A Lot Help needed moving to and from a bed to chair (including a wheelchair)?: A Little Help needed walking in hospital room?: A Little Help needed climbing 3-5 steps with a railing? : A Little 6 Click Score: 13    End of Session Equipment Utilized During Treatment: Gait belt Activity Tolerance: Patient tolerated treatment well Patient left: in bed;with call bell/phone within reach;with bed alarm set;with family/visitor present;with nursing/sitter in room;with SCD's reapplied Nurse Communication: Mobility status PT Visit Diagnosis: Pain;Muscle weakness (generalized) (M62.81);Unsteadiness on feet (R26.81) Pain - Right/Left: Right Pain - part of body: Hip     Time: 9798-9211 PT Time Calculation (min) (ACUTE ONLY): 32 min  Charges:  $Gait Training: 8-22 mins $Therapeutic Exercise: 8-22 mins                    G Codes:       Collie Siad PT, DPT 02/24/2017, 2:53 PM

## 2017-02-24 NOTE — NC FL2 (Signed)
[place] LEVEL OF CARE SCREENING TOOL     IDENTIFICATION  Patient Name: Gerald Hall Birthdate: 1935-08-06 Sex: male Admission Date (Current Location): 02/23/2017  [place] and [place] Number:  Engineering geologist and Address:  [facility], [address], [place], [place] [address]      Provider Number: [id number]  Attending Physician Name and Address:  Hessie Knows, MD  Relative Name and Phone Number:       Current Level of Care: Hospital Recommended Level of Care: [place] Prior Approval Number:    Date Approved/Denied:   PASRR Number:  [id number])  Discharge Plan: SNF    Current Diagnoses: Patient Active Problem List   Diagnosis Date Noted  . Avascular necrosis of bone of right hip ([place]) 02/23/2017  . Primary osteoarthritis involving multiple joints 02/21/2017  . Smoker 02/21/2017  . Macular degeneration 11/10/2016  . Insomnia 07/20/2016  . Restless legs 07/20/2016  . Spinal stenosis in cervical region 10/14/2015  . Senile purpura ([place]) 04/18/2015  . Osteopenia 01/28/2015  . Spinal stenosis of lumbar region 01/20/2015  . BPH (benign prostatic hyperplasia) 01/20/2015  . Diverticulosis of colon without hemorrhage   . Encounter for screening colonoscopy 11/21/2014  . HTN (hypertension), benign 04/17/2014  . Temporal arteritis ([place]) 04/17/2014    Orientation RESPIRATION BLADDER Height & Weight     Self, Time, Situation, Place  Normal Continent Weight: 174 lb (78.9 kg) Height:  5\' 9"  (175.3 cm)  BEHAVIORAL SYMPTOMS/MOOD NEUROLOGICAL BOWEL NUTRITION STATUS   (none)  (none) Continent Diet (Diet: Regular )  AMBULATORY STATUS COMMUNICATION OF NEEDS Skin   Extensive Assist Verbally Surgical wounds (Incision: Right Hip. )                       Personal Care Assistance Level of Assistance  Bathing, Feeding, Dressing Bathing Assistance: Limited assistance Feeding assistance: Independent Dressing  Assistance: Limited assistance     Functional Limitations Info  Sight, Hearing, Speech Sight Info: Adequate Hearing Info: Impaired Speech Info: Adequate    SPECIAL CARE FACTORS FREQUENCY  PT (By licensed PT), OT (By licensed OT)     PT Frequency:  (5) OT Frequency:  (5)            Contractures      Additional Factors Info  Code Status, Allergies Code Status Info:  (Full Code. ) Allergies Info:  (No Known Allergies. )           Current Medications (02/24/2017):  This is the current hospital active medication list Current Facility-Administered Medications  Medication Dose Route Frequency Provider Last Rate Last Dose  . 0.9 %  sodium chloride infusion   Intravenous Continuous Hessie Knows, MD 75 mL/hr at 02/24/17 [phone]    . acetaminophen (TYLENOL) tablet 650 mg  650 mg Oral Q6H PRN Hessie Knows, MD       Or  . acetaminophen (TYLENOL) suppository 650 mg  650 mg Rectal Q6H PRN Hessie Knows, MD      . aspirin EC tablet 81 mg  81 mg Oral Daily Hessie Knows, MD   81 mg at 02/24/17 0901  . bisacodyl (DULCOLAX) suppository 10 mg  10 mg Rectal Daily PRN Hessie Knows, MD      . cholecalciferol (VITAMIN D) tablet 400 Units  400 Units Oral Daily Hessie Knows, MD   400 Units at 02/24/17 0901  . diphenhydrAMINE (BENADRYL) 12.5 MG/5ML elixir 12.5-25 mg  12.5-25 mg Oral Q4H PRN  Hessie Knows, MD      . enoxaparin (LOVENOX) injection 40 mg  40 mg Subcutaneous Q24H Hessie Knows, MD   40 mg at 02/24/17 0901  . fluticasone (FLONASE) 50 MCG/ACT nasal spray 2 spray  2 spray Each Nare Daily Hessie Knows, MD      . hydrochlorothiazide (HYDRODIURIL) tablet 25 mg  25 mg Oral Daily Hessie Knows, MD   25 mg at 02/24/17 0901  . HYDROcodone-acetaminophen (NORCO) 7.5-325 MG per tablet 1 tablet  1 tablet Oral Q4H PRN Hessie Knows, MD   1 tablet at 02/24/17 0900  . LORazepam (ATIVAN) tablet 0.5-1 mg  0.5-1 mg Oral QHS PRN Hessie Knows, MD   1 mg at 02/23/17 2108  . magnesium citrate solution 1 Bottle  1  Bottle Oral Once PRN Hessie Knows, MD      . magnesium hydroxide (MILK OF MAGNESIA) suspension 30 mL  30 mL Oral Daily PRN Hessie Knows, MD   30 mL at 02/23/17 2104  . magnesium hydroxide (MILK OF MAGNESIA) suspension 30 mL  30 mL Oral Daily PRN Duanne Guess, PA-C      . menthol-cetylpyridinium (CEPACOL) lozenge 3 mg  1 lozenge Oral PRN Hessie Knows, MD       Or  . phenol (CHLORASEPTIC) mouth spray 1 spray  1 spray Mouth/Throat PRN Hessie Knows, MD      . methocarbamol (ROBAXIN) tablet 500 mg  500 mg Oral Q6H PRN Hessie Knows, MD   500 mg at 02/23/17 2104   Or  . methocarbamol (ROBAXIN) 500 mg in dextrose 5 % 50 mL IVPB  500 mg Intravenous Q6H PRN Hessie Knows, MD      . metoCLOPramide (REGLAN) tablet 5-10 mg  5-10 mg Oral Q8H PRN Hessie Knows, MD       Or  . metoCLOPramide (REGLAN) injection 5-10 mg  5-10 mg Intravenous Q8H PRN Hessie Knows, MD      . morphine 2 MG/ML injection 2 mg  2 mg Intravenous Q2H PRN Hessie Knows, MD   2 mg at 02/23/17 2358  . ondansetron (ZOFRAN) tablet 4 mg  4 mg Oral Q6H PRN Hessie Knows, MD       Or  . ondansetron [facility]) injection 4 mg  4 mg Intravenous Q6H PRN Hessie Knows, MD   4 mg at 02/23/17 2358  . potassium chloride (K-DUR,KLOR-CON) CR tablet 10 mEq  10 mEq Oral TID Hessie Knows, MD   10 mEq at 02/24/17 0900  . potassium chloride (KLOR-CON) packet 20 mEq  20 mEq Oral TID Duanne Guess, PA-C      . rOPINIRole (REQUIP) tablet 3 mg  3 mg Oral QHS Hessie Knows, MD   3 mg at 02/23/17 2104  . senna-docusate (Senokot-S) tablet 1 tablet  1 tablet Oral BID Hessie Knows, MD   1 tablet at 02/24/17 0859  . tamsulosin (FLOMAX) capsule 0.4 mg  0.4 mg Oral QPC supper Hessie Knows, MD   0.4 mg at 02/23/17 1702  . traZODone (DESYREL) tablet 25-50 mg  25-50 mg Oral QHS PRN Hessie Knows, MD   50 mg at 02/23/17 2108  . zolpidem (AMBIEN) tablet 5 mg  5 mg Oral QHS PRN Hessie Knows, MD         Discharge Medications: Please see discharge summary for a list of discharge  medications.  Relevant Imaging Results:  Relevant Lab Results:   Additional Information  (SSN: [id number])  Brighid Koch, Veronia Beets, LCSW

## 2017-02-25 LAB — CBC
HCT: 40.6 % (ref 40.0–52.0)
HEMOGLOBIN: 13.9 g/dL (ref 13.0–18.0)
MCH: 32.3 pg (ref 26.0–34.0)
MCHC: 34.2 g/dL (ref 32.0–36.0)
MCV: 94.5 fL (ref 80.0–100.0)
Platelets: 147 10*3/uL — ABNORMAL LOW (ref 150–440)
RBC: 4.29 MIL/uL — AB (ref 4.40–5.90)
RDW: 14.2 % (ref 11.5–14.5)
WBC: 11.5 10*3/uL — AB (ref 3.8–10.6)

## 2017-02-25 LAB — BASIC METABOLIC PANEL
ANION GAP: 5 (ref 5–15)
BUN: 16 mg/dL (ref 6–20)
CHLORIDE: 101 mmol/L (ref 101–111)
CO2: 30 mmol/L (ref 22–32)
Calcium: 8.4 mg/dL — ABNORMAL LOW (ref 8.9–10.3)
Creatinine, Ser: 1.03 mg/dL (ref 0.61–1.24)
GFR calc Af Amer: >60 mL/min (ref >60)
GFR calc non Af Amer: >60 mL/min (ref >60)
Glucose, Bld: 98 mg/dL (ref 65–99)
POTASSIUM: 3.5 mmol/L (ref 3.5–5.1)
SODIUM: 136 mmol/L (ref 135–145)

## 2017-02-25 LAB — SURGICAL PATHOLOGY

## 2017-02-25 MED ORDER — ENOXAPARIN SODIUM 40 MG/0.4ML ~~LOC~~ SOLN: 40.0000 mg | SUBCUTANEOUS | 0 refills | Status: DC

## 2017-02-25 MED ORDER — HYDROCODONE-ACETAMINOPHEN 7.5-325 MG PO TABS: 1.0000 | ORAL_TABLET | ORAL | 0 refills | Status: DC | PRN

## 2017-02-25 NOTE — Progress Notes (Addendum)
   Subjective: 2 Days Post-Op Procedure(s) (LRB): TOTAL HIP ARTHROPLASTY ANTERIOR APPROACH (Right) Patient reports pain as mild.   Patient is well, and has had no acute complaints or problems Denies any CP, SOB, ABD pain. We will continue therapy today.  Plan is to go Home after hospital stay.  Objective: Vital signs in last 24 hours: Temp:  [98.7 F (37.1 C)-99 F (37.2 C)] 99 F (37.2 C) (05/03 2258) Pulse Rate:  [83] 83 (05/03 2258) Resp:  [16] 16 (05/03 2258) BP: (129-139)/(67-71) 139/71 (05/03 2258) SpO2:  [92 %-93 %] 92 % (05/03 2258)  Intake/Output from previous day: 05/03 0701 - 05/04 0700 In: 1037.5 [P.O.:360; I.V.:677.5] Out: 1400 [Urine:1400] Intake/Output this shift: No intake/output data recorded.   Recent Labs  02/22/17 1314 02/23/17 1547 02/24/17 0500 02/25/17 0532  HGB 16.4 14.8 14.1 13.9    Recent Labs  02/24/17 0500 02/25/17 0532  WBC 14.1* 11.5*  RBC 4.41 4.29*  HCT 41.9 40.6  PLT 159 147*    Recent Labs  02/24/17 0500 02/25/17 0532  NA 139 136  K 3.0* 3.5  CL 103 101  CO2 31 30  BUN 16 16  CREATININE 1.13 1.03  GLUCOSE 114* 98  CALCIUM 8.6* 8.4*    Recent Labs  02/22/17 1314  INR 0.92    EXAM General - Patient is Alert, Appropriate and Oriented Extremity - Neurovascular intact Sensation intact distally Intact pulses distally Dorsiflexion/Plantar flexion intact No cellulitis present Compartment soft Dressing - dressing C/D/I and no drainage Motor Function - intact, moving foot and toes well on exam.   Past Medical History:  Diagnosis Date  . Arthritis   . Balance problem    uses a walker  . Blood in urine    Sees urology yearly  . Deafness in right ear age 78  . Hyperlipidemia   . Hypertension   . Temporal arteritis ([place]) 2016  . Urinary incontinence     Assessment/Plan:   2 Days Post-Op Procedure(s) (LRB): TOTAL HIP ARTHROPLASTY ANTERIOR APPROACH (Right) Active Problems:   Avascular necrosis of bone of  right hip ([facility])   Hypokalemia  Estimated body mass index is 25.7 kg/m as calculated from the following:   Height as of this encounter: 5\' 9"  (1.753 m).   Weight as of this encounter: 78.9 kg (174 lb). Advance diet Up with therapy  Hypokalemia- resolved, potassium 3.5 Plan on discharge to home with home health PT today after morning physical therapy. Follow-up with [facility] orthopedics in 2 weeks   DVT Prophylaxis - Lovenox, Foot Pumps and TED hose Weight-Bearing as tolerated to right leg   T. Rachelle Hora, PA-C [place] 02/25/2017, 7:44 AM

## 2017-02-25 NOTE — Care Management (Signed)
Lovenox $50.27; patient notified. Spoke with daughter [name] and confirmed [facility]. He has a rollator and will need a front-wheeled walker which has been requested from [place] with [facility] to be delivered to this room prior to discharge. [name] thinks he will discharge today; [facility] updated.

## 2017-02-25 NOTE — Discharge Instructions (Signed)

## 2017-02-25 NOTE — Progress Notes (Signed)
[name] to be D/C'd Home per MD order.  Discussed with the patient and all questions fully answered.  VSS, Skin clean, dry and intact without evidence of skin break down, no evidence of skin tears noted. IV catheter discontinued intact. Site without signs and symptoms of complications. Dressing and pressure applied.  An After Visit Summary was printed and given to the patient. Patient received prescription.  D/c education completed with patient/family including follow up instructions, medication list, d/c activities limitations if indicated, with other d/c instructions as indicated by MD - patient able to verbalize understanding, all questions fully answered.   Patient instructed to return to ED, call 911, or call MD for any changes in condition.   Patient escorted via [place], and D/C home via private auto.  Gerald Hall 02/25/2017 1:33 PM

## 2017-02-25 NOTE — Discharge Summary (Signed)
Physician Discharge Summary  Patient ID: Gerald Hall MRN: [id number] DOB/AGE: 1935/10/11 82 y.o.  Admit date: 02/23/2017 Discharge date: 02/25/2017  Admission Diagnoses:  AVASCULAR NECROSIS OF BONE RIGHT HIP   Discharge Diagnoses: Patient Active Problem List   Diagnosis Date Noted  . Avascular necrosis of bone of right hip ([place]) 02/23/2017  . Primary osteoarthritis involving multiple joints 02/21/2017  . Smoker 02/21/2017  . Macular degeneration 11/10/2016  . Insomnia 07/20/2016  . Restless legs 07/20/2016  . Spinal stenosis in cervical region 10/14/2015  . Senile purpura ([place]) 04/18/2015  . Osteopenia 01/28/2015  . Spinal stenosis of lumbar region 01/20/2015  . BPH (benign prostatic hyperplasia) 01/20/2015  . Diverticulosis of colon without hemorrhage   . Encounter for screening colonoscopy 11/21/2014  . HTN (hypertension), benign 04/17/2014  . Temporal arteritis ([place]) 04/17/2014    Past Medical History:  Diagnosis Date  . Arthritis   . Balance problem    uses a walker  . Blood in urine    Sees urology yearly  . Deafness in right ear age 36  . Hyperlipidemia   . Hypertension   . Temporal arteritis ([place]) 2016  . Urinary incontinence      Transfusion: None   Consultants (if any):   Discharged Condition: Improved  Hospital Course: Gerald Hall is an 82 y.o. male who was admitted 02/23/2017 with a diagnosis of  Right hip avascular necrosis and went to the operating room on 02/23/2017 and underwent the above named procedures.    Surgeries: Procedure(s): TOTAL HIP ARTHROPLASTY ANTERIOR APPROACH on 02/23/2017 Patient tolerated the surgery well. Taken to PACU where she was stabilized and then transferred to the orthopedic floor.  Started on Lovenox 40 q 24 hrs. Foot pumps applied bilaterally at 80 mm. Heels elevated on bed with rolled towels. No evidence of DVT. Negative [place]. Physical therapy started on day #1 for gait training and transfer. OT started day #1 for ADL  and assisted devices.  Patient's foley was d/c on day #1. Patient's IV was d/c on day #2.  On post op day #2 patient was stable and ready for discharge to home with home health physical therapy.  Implants: Medacta AMIS 4 standard stem, 56 mm Mpact DM with liner and L 28 mm head  He was given perioperative antibiotics:  Anti-infectives    Start     Dose/Rate Route Frequency Ordered Stop   02/23/17 1730  ceFAZolin (ANCEF) IVPB 2g/100 mL premix     2 g 200 mL/hr over 30 Minutes Intravenous Every 6 hours 02/23/17 1535 02/24/17 0558   02/23/17 1000  ceFAZolin (ANCEF) 2-4 GM/100ML-% IVPB    Comments:  KENNEDY, DENISE: cabinet override      02/23/17 1000 02/23/17 1140   02/23/17 0000  ceFAZolin (ANCEF) IVPB 2g/100 mL premix     2 g 200 mL/hr over 30 Minutes Intravenous  Once 02/22/17 2356 02/23/17 1140    .  He was given sequential compression devices, early ambulation, and Lovenox for DVT prophylaxis.  He benefited maximally from the hospital stay and there were no complications.    Recent vital signs:  Vitals:   02/24/17 1525 02/24/17 2258  BP: 129/67 139/71  Pulse: 83 83  Resp:  16  Temp: 98.7 F (37.1 C) 99 F (37.2 C)    Recent laboratory studies:  Lab Results  Component Value Date   HGB 13.9 02/25/2017   HGB 14.1 02/24/2017   HGB 14.8 02/23/2017   Lab Results  Component Value  Date   WBC 11.5 (H) 02/25/2017   PLT 147 (L) 02/25/2017   Lab Results  Component Value Date   INR 0.92 02/22/2017   Lab Results  Component Value Date   NA 136 02/25/2017   K 3.5 02/25/2017   CL 101 02/25/2017   CO2 30 02/25/2017   BUN 16 02/25/2017   CREATININE 1.03 02/25/2017   GLUCOSE 98 02/25/2017    Discharge Medications:   Allergies as of 02/25/2017   No Known Allergies     Medication List    STOP taking these medications   traMADol 50 MG tablet Commonly known as:  ULTRAM     TAKE these medications   aspirin EC 81 MG tablet Take 81 mg by mouth daily.    cholecalciferol 400 units Tabs tablet Commonly known as:  VITAMIN D Take 400 Units by mouth.   enoxaparin 40 MG/0.4ML injection Commonly known as:  LOVENOX Inject 0.4 mLs (40 mg total) into the skin daily.   fluticasone 50 MCG/ACT nasal spray Commonly known as:  FLONASE 2 SPRAYS EACH NOSTRIL DAILY   hydrochlorothiazide 25 MG tablet Commonly known as:  HYDRODIURIL Take 1 tablet (25 mg total) by mouth daily.   HYDROcodone-acetaminophen 7.5-325 MG tablet Commonly known as:  NORCO Take 1 tablet by mouth every 4 (four) hours as needed for moderate pain.   LORazepam 1 MG tablet Commonly known as:  ATIVAN Take 0.5-1 tablets (0.5-1 mg total) by mouth at bedtime as needed (insomnia).   potassium chloride 10 MEQ tablet Commonly known as:  K-DUR Take 1 tablet (10 mEq total) by mouth daily.   rOPINIRole 3 MG tablet Commonly known as:  REQUIP Take 1 tablet (3 mg total) by mouth at bedtime.   senna-docusate 8.6-50 MG tablet Commonly known as:  Senokot-S Take by mouth daily.   tamsulosin 0.4 MG Caps capsule Commonly known as:  FLOMAX TAKE 1 CAPSULE DAILY AT BEDTIME.   traZODone 50 MG tablet Commonly known as:  DESYREL TAKE 1/2 TO 1 TABLET BY MOUTH AT BEDTIME AS NEEDED FOR SLEEP   TYLENOL 325 MG Caps Generic drug:  Acetaminophen Take by mouth.            Durable Medical Equipment        Start     Ordered   02/23/17 1536  DME 3 n 1  Once     02/23/17 1535   02/23/17 1536  DME Walker rolling  Once    Question:  Patient needs a walker to treat with the following condition  Answer:  Status post total hip replacement, right   02/23/17 1535   02/23/17 1536  DME Bedside commode  Once    Question:  Patient needs a bedside commode to treat with the following condition  Answer:  Status post total hip replacement, right   02/23/17 1535      Diagnostic Studies: Dg Hip Operative Unilat W Or W/o Pelvis Right  Result Date: 02/23/2017 CLINICAL DATA:  Right hip replacement.  EXAM: OPERATIVE RIGHT HIP (WITH PELVIS IF PERFORMED)  VIEWS TECHNIQUE: Fluoroscopic spot image(s) were submitted for interpretation post-operatively. COMPARISON:  10/14/2015. FINDINGS: Total right hip replacement. Hardware intact. Anatomic alignment . 1 image. 0 minutes 24 seconds fluoroscopy time. IMPRESSION: Total right hip replacement.  Anatomic alignment. Electronically Signed   By: Marcello Moores  Register   On: 02/23/2017 13:22   Dg Hip Unilat W Or W/o Pelvis 2-3 Views Right  Result Date: 02/23/2017 CLINICAL DATA:  Postop day 0 after right  total hip arthroplasty from anterior approach EXAM: DG HIP (WITH OR WITHOUT PELVIS) 2-3V RIGHT COMPARISON:  10/14/2015 radiographs of the right hip FINDINGS: There is an uncemented press fit right total arthroplasty without postoperative fracture nor malalignment. Its expected soft tissue emphysema and skin staples along the anterolateral aspect of the right and thigh. IMPRESSION: Intact right total hip arthroplasty in anatomic alignment. Electronically Signed   By: Ashley Royalty M.D.   On: 02/23/2017 14:09    Disposition: 01-Home or [place], MD Follow up in 2 week(s).   Specialty:  Orthopedic Surgery Contact information: [place] [address] [phone]            Signed: DEWITT, JUDICE 02/25/2017, 7:41 AM

## 2017-02-25 NOTE — Progress Notes (Signed)
Physical Therapy Treatment Patient Details Name: Gerald Hall MRN: [id number] DOB: 11-14-1934 Today's Date: 02/25/2017    History of Present Illness Pt is a 82 y/o M s/p R THA, direct anterior approach.  Pt's PMH includes deafness in R ear, urinary incontinence.    PT Comments    Gerald Hall continues to make good progress with mobility.  He ambulated 200 ft with RW and min guard assist. Education provided on home layout, car transfer, and appropriate footwear.  All of pt's and daughter's questions answered.     Follow Up Recommendations  Home health PT;Supervision for mobility/OOB     Equipment Recommendations  Rolling walker with 5" wheels (with wheels installed on inside of RW)    Recommendations for Other Services OT consult     Precautions / Restrictions Precautions Precautions: Fall;Other (comment) Precaution Comments: pt with urgency and incontinent of urine Restrictions Weight Bearing Restrictions: Yes RLE Weight Bearing: Weight bearing as tolerated    Mobility  Bed Mobility Overal bed mobility: Needs Assistance Bed Mobility: Sit to Supine       Sit to supine: Min guard   General bed mobility comments: HOB flat and no use of bed rail to simulate home environment.  Pt instructed to trial leg hook technique which he performs successfully without assist.    Transfers Overall transfer level: Needs assistance Equipment used: Rolling walker (2 wheeled) Transfers: Sit to/from Stand Sit to Stand: Min guard         General transfer comment: Pt demonstrated improved eccentric control when sitting today.  He stands slowly but is steady.  Ambulation/Gait Ambulation/Gait assistance: Min guard Ambulation Distance (Feet): 200 Feet Assistive device: Rolling walker (2 wheeled) Gait Pattern/deviations: Step-through pattern;Decreased stride length;Decreased weight shift to right;Decreased stance time - right;Decreased step length - left;Antalgic;Trunk flexed Gait  velocity: decreased Gait velocity interpretation: Below normal speed for age/gender General Gait Details: Cues for upright posture which improved this session.  Demonstration and cues for heel strike.   Stairs            Wheelchair Mobility    Modified Rankin (Stroke Patients Only)       Balance Overall balance assessment: Needs assistance Sitting-balance support: No upper extremity supported;Feet supported Sitting balance-Leahy Scale: Good     Standing balance support: Single extremity supported;During functional activity Standing balance-Leahy Scale: Fair Standing balance comment: Pt able to stand statically without UE support but requires BUE support for dynamic activities.                            Cognition Arousal/Alertness: Awake/alert Behavior During Therapy: WFL for tasks assessed/performed Overall Cognitive Status: Within Functional Limits for tasks assessed                                        Exercises Total Joint Exercises Ankle Circles/Pumps: AROM;Both;10 reps;Seated Quad Sets: Strengthening;Both;10 reps;Supine Heel Slides: Strengthening;Right;10 reps;AAROM;Supine Hip ABduction/ADduction: AAROM;Right;10 reps;Supine Straight Leg Raises: AAROM;Right;10 reps;Supine Long Arc Quad: Strengthening;Right;10 reps;Seated    General Comments General comments (skin integrity, edema, etc.): Daughter present during session.      Pertinent Vitals/Pain Pain Assessment: 0-10 Pain Score: 5  Pain Location: R hip Pain Descriptors / Indicators: Discomfort;Grimacing;Guarding Pain Intervention(s): Monitored during session;Limited activity within patient's tolerance;Repositioned    Home Living  Prior Function            PT Goals (current goals can now be found in the care plan section) Acute Rehab PT Goals Patient Stated Goal: "to go home" PT Goal Formulation: With patient Time For Goal Achievement:  03/10/17 Potential to Achieve Goals: Good Progress towards PT goals: Progressing toward goals    Frequency    BID      PT Plan Current plan remains appropriate    Co-evaluation              AM-PAC PT "6 Clicks" Daily Activity  Outcome Measure  Difficulty turning over in bed (including adjusting bedclothes, sheets and blankets)?: Total Difficulty moving from lying on back to sitting on the side of the bed? : Total Difficulty sitting down on and standing up from a chair with arms (e.g., wheelchair, bedside commode, etc,.)?: A Lot Help needed moving to and from a bed to chair (including a wheelchair)?: A Little Help needed walking in hospital room?: A Little Help needed climbing 3-5 steps with a railing? : A Little 6 Click Score: 13    End of Session Equipment Utilized During Treatment: Gait belt Activity Tolerance: Patient tolerated treatment well Patient left: in bed;with call bell/phone within reach;with bed alarm set;with family/visitor present;with SCD's reapplied Nurse Communication: Mobility status PT Visit Diagnosis: Pain;Muscle weakness (generalized) (M62.81);Unsteadiness on feet (R26.81) Pain - Right/Left: Right Pain - part of body: Hip     Time: 5361-4431 PT Time Calculation (min) (ACUTE ONLY): 36 min  Charges:  $Gait Training: 8-22 mins $Therapeutic Exercise: 8-22 mins                    G Codes:       Collie Siad PT, DPT 02/25/2017, 11:40 AM

## 2017-02-25 NOTE — Care Management Note (Signed)
Case Management Note  Patient Details  Name: Gerald Hall MRN: [id number] Date of Birth: 1935-06-06  Subjective/Objective:  Spoke with daughter, [name]. Discussed discharge planning. She is agreeable to home PT. [facility]ers [facility]. [facility] to [facility] for [facility]. He has a walker and BSC. Pharmacy: [facility]: [phone]. Called Lovenox 40 mg # 14 no refills. PCP is Dr. Sallee Lange.                 Action/Plan: [facility] for PT, Lovenox called in. No DME needs  Expected Discharge Date:                  Expected Discharge Plan:  [place]  In-House Referral:     Discharge planning Services  CM Consult  Post Acute Care Choice:  Home Health Choice offered to:  Adult Children  DME Arranged:    DME Agency:     HH Arranged:  PT HH Agency:  [place]  Status of Service:  In process, will continue to follow  If discussed at Long Length of Stay Meetings, dates discussed:    Additional Comments:  Jolly Mango, RN 02/25/2017, 9:58 AM

## 2017-02-26 DIAGNOSIS — Z7982 Long term (current) use of aspirin: Secondary | ICD-10-CM | POA: Diagnosis not present

## 2017-02-26 DIAGNOSIS — Z471 Aftercare following joint replacement surgery: Secondary | ICD-10-CM | POA: Diagnosis not present

## 2017-02-26 DIAGNOSIS — M150 Primary generalized (osteo)arthritis: Secondary | ICD-10-CM | POA: Diagnosis not present

## 2017-02-26 DIAGNOSIS — Z96641 Presence of right artificial hip joint: Secondary | ICD-10-CM | POA: Diagnosis not present

## 2017-02-26 DIAGNOSIS — I10 Essential (primary) hypertension: Secondary | ICD-10-CM | POA: Diagnosis not present

## 2017-02-26 DIAGNOSIS — M8580 Other specified disorders of bone density and structure, unspecified site: Secondary | ICD-10-CM | POA: Diagnosis not present

## 2017-02-26 DIAGNOSIS — G2581 Restless legs syndrome: Secondary | ICD-10-CM | POA: Diagnosis not present

## 2017-02-26 DIAGNOSIS — Z87891 Personal history of nicotine dependence: Secondary | ICD-10-CM | POA: Diagnosis not present

## 2017-02-26 DIAGNOSIS — H3530 Unspecified macular degeneration: Secondary | ICD-10-CM | POA: Diagnosis not present

## 2017-02-28 DIAGNOSIS — G2581 Restless legs syndrome: Secondary | ICD-10-CM | POA: Diagnosis not present

## 2017-02-28 DIAGNOSIS — M150 Primary generalized (osteo)arthritis: Secondary | ICD-10-CM | POA: Diagnosis not present

## 2017-02-28 DIAGNOSIS — I10 Essential (primary) hypertension: Secondary | ICD-10-CM | POA: Diagnosis not present

## 2017-02-28 DIAGNOSIS — Z96641 Presence of right artificial hip joint: Secondary | ICD-10-CM | POA: Diagnosis not present

## 2017-02-28 DIAGNOSIS — Z471 Aftercare following joint replacement surgery: Secondary | ICD-10-CM | POA: Diagnosis not present

## 2017-02-28 DIAGNOSIS — M8580 Other specified disorders of bone density and structure, unspecified site: Secondary | ICD-10-CM | POA: Diagnosis not present

## 2017-03-02 DIAGNOSIS — Z96641 Presence of right artificial hip joint: Secondary | ICD-10-CM | POA: Diagnosis not present

## 2017-03-02 DIAGNOSIS — Z471 Aftercare following joint replacement surgery: Secondary | ICD-10-CM | POA: Diagnosis not present

## 2017-03-02 DIAGNOSIS — M150 Primary generalized (osteo)arthritis: Secondary | ICD-10-CM | POA: Diagnosis not present

## 2017-03-02 DIAGNOSIS — M8580 Other specified disorders of bone density and structure, unspecified site: Secondary | ICD-10-CM | POA: Diagnosis not present

## 2017-03-02 DIAGNOSIS — I10 Essential (primary) hypertension: Secondary | ICD-10-CM | POA: Diagnosis not present

## 2017-03-02 DIAGNOSIS — G2581 Restless legs syndrome: Secondary | ICD-10-CM | POA: Diagnosis not present

## 2017-03-03 DIAGNOSIS — Z96641 Presence of right artificial hip joint: Secondary | ICD-10-CM | POA: Diagnosis not present

## 2017-03-04 DIAGNOSIS — I10 Essential (primary) hypertension: Secondary | ICD-10-CM | POA: Diagnosis not present

## 2017-03-04 DIAGNOSIS — M8580 Other specified disorders of bone density and structure, unspecified site: Secondary | ICD-10-CM | POA: Diagnosis not present

## 2017-03-04 DIAGNOSIS — G2581 Restless legs syndrome: Secondary | ICD-10-CM | POA: Diagnosis not present

## 2017-03-04 DIAGNOSIS — M150 Primary generalized (osteo)arthritis: Secondary | ICD-10-CM | POA: Diagnosis not present

## 2017-03-04 DIAGNOSIS — Z471 Aftercare following joint replacement surgery: Secondary | ICD-10-CM | POA: Diagnosis not present

## 2017-03-04 DIAGNOSIS — Z96641 Presence of right artificial hip joint: Secondary | ICD-10-CM | POA: Diagnosis not present

## 2017-03-07 DIAGNOSIS — M8580 Other specified disorders of bone density and structure, unspecified site: Secondary | ICD-10-CM | POA: Diagnosis not present

## 2017-03-07 DIAGNOSIS — Z471 Aftercare following joint replacement surgery: Secondary | ICD-10-CM | POA: Diagnosis not present

## 2017-03-07 DIAGNOSIS — G2581 Restless legs syndrome: Secondary | ICD-10-CM | POA: Diagnosis not present

## 2017-03-07 DIAGNOSIS — M150 Primary generalized (osteo)arthritis: Secondary | ICD-10-CM | POA: Diagnosis not present

## 2017-03-07 DIAGNOSIS — Z96641 Presence of right artificial hip joint: Secondary | ICD-10-CM | POA: Diagnosis not present

## 2017-03-07 DIAGNOSIS — I10 Essential (primary) hypertension: Secondary | ICD-10-CM | POA: Diagnosis not present

## 2017-03-09 DIAGNOSIS — M8580 Other specified disorders of bone density and structure, unspecified site: Secondary | ICD-10-CM | POA: Diagnosis not present

## 2017-03-09 DIAGNOSIS — M150 Primary generalized (osteo)arthritis: Secondary | ICD-10-CM | POA: Diagnosis not present

## 2017-03-09 DIAGNOSIS — Z96641 Presence of right artificial hip joint: Secondary | ICD-10-CM | POA: Diagnosis not present

## 2017-03-09 DIAGNOSIS — Z471 Aftercare following joint replacement surgery: Secondary | ICD-10-CM | POA: Diagnosis not present

## 2017-03-09 DIAGNOSIS — I10 Essential (primary) hypertension: Secondary | ICD-10-CM | POA: Diagnosis not present

## 2017-03-09 DIAGNOSIS — G2581 Restless legs syndrome: Secondary | ICD-10-CM | POA: Diagnosis not present

## 2017-03-11 DIAGNOSIS — G2581 Restless legs syndrome: Secondary | ICD-10-CM | POA: Diagnosis not present

## 2017-03-11 DIAGNOSIS — M8580 Other specified disorders of bone density and structure, unspecified site: Secondary | ICD-10-CM | POA: Diagnosis not present

## 2017-03-11 DIAGNOSIS — I10 Essential (primary) hypertension: Secondary | ICD-10-CM | POA: Diagnosis not present

## 2017-03-11 DIAGNOSIS — M150 Primary generalized (osteo)arthritis: Secondary | ICD-10-CM | POA: Diagnosis not present

## 2017-03-11 DIAGNOSIS — Z471 Aftercare following joint replacement surgery: Secondary | ICD-10-CM | POA: Diagnosis not present

## 2017-03-11 DIAGNOSIS — Z96641 Presence of right artificial hip joint: Secondary | ICD-10-CM | POA: Diagnosis not present

## 2017-03-14 DIAGNOSIS — I10 Essential (primary) hypertension: Secondary | ICD-10-CM | POA: Diagnosis not present

## 2017-03-14 DIAGNOSIS — M8580 Other specified disorders of bone density and structure, unspecified site: Secondary | ICD-10-CM | POA: Diagnosis not present

## 2017-03-14 DIAGNOSIS — Z471 Aftercare following joint replacement surgery: Secondary | ICD-10-CM | POA: Diagnosis not present

## 2017-03-14 DIAGNOSIS — M150 Primary generalized (osteo)arthritis: Secondary | ICD-10-CM | POA: Diagnosis not present

## 2017-03-14 DIAGNOSIS — G2581 Restless legs syndrome: Secondary | ICD-10-CM | POA: Diagnosis not present

## 2017-03-14 DIAGNOSIS — Z96641 Presence of right artificial hip joint: Secondary | ICD-10-CM | POA: Diagnosis not present

## 2017-03-16 DIAGNOSIS — G2581 Restless legs syndrome: Secondary | ICD-10-CM | POA: Diagnosis not present

## 2017-03-16 DIAGNOSIS — M8580 Other specified disorders of bone density and structure, unspecified site: Secondary | ICD-10-CM | POA: Diagnosis not present

## 2017-03-16 DIAGNOSIS — M150 Primary generalized (osteo)arthritis: Secondary | ICD-10-CM | POA: Diagnosis not present

## 2017-03-16 DIAGNOSIS — I10 Essential (primary) hypertension: Secondary | ICD-10-CM | POA: Diagnosis not present

## 2017-03-16 DIAGNOSIS — Z471 Aftercare following joint replacement surgery: Secondary | ICD-10-CM | POA: Diagnosis not present

## 2017-03-16 DIAGNOSIS — Z96641 Presence of right artificial hip joint: Secondary | ICD-10-CM | POA: Diagnosis not present

## 2017-03-17 DIAGNOSIS — M8580 Other specified disorders of bone density and structure, unspecified site: Secondary | ICD-10-CM | POA: Diagnosis not present

## 2017-03-17 DIAGNOSIS — I10 Essential (primary) hypertension: Secondary | ICD-10-CM | POA: Diagnosis not present

## 2017-03-17 DIAGNOSIS — M150 Primary generalized (osteo)arthritis: Secondary | ICD-10-CM | POA: Diagnosis not present

## 2017-03-17 DIAGNOSIS — G2581 Restless legs syndrome: Secondary | ICD-10-CM | POA: Diagnosis not present

## 2017-03-17 DIAGNOSIS — Z96641 Presence of right artificial hip joint: Secondary | ICD-10-CM | POA: Diagnosis not present

## 2017-03-17 DIAGNOSIS — Z471 Aftercare following joint replacement surgery: Secondary | ICD-10-CM | POA: Diagnosis not present

## 2017-04-08 DIAGNOSIS — Z96641 Presence of right artificial hip joint: Secondary | ICD-10-CM | POA: Diagnosis not present

## 2017-04-12 DIAGNOSIS — R270 Ataxia, unspecified: Secondary | ICD-10-CM | POA: Diagnosis not present

## 2017-04-12 DIAGNOSIS — Z471 Aftercare following joint replacement surgery: Secondary | ICD-10-CM | POA: Diagnosis not present

## 2017-04-12 DIAGNOSIS — Z96641 Presence of right artificial hip joint: Secondary | ICD-10-CM | POA: Diagnosis not present

## 2017-05-11 ENCOUNTER — Other Ambulatory Visit: Payer: Self-pay | Admitting: Family Medicine

## 2017-05-27 DIAGNOSIS — Q828 Other specified congenital malformations of skin: Secondary | ICD-10-CM | POA: Diagnosis not present

## 2017-05-27 DIAGNOSIS — M79675 Pain in left toe(s): Secondary | ICD-10-CM | POA: Diagnosis not present

## 2017-05-27 DIAGNOSIS — B351 Tinea unguium: Secondary | ICD-10-CM | POA: Diagnosis not present

## 2017-06-14 ENCOUNTER — Encounter: Payer: Self-pay | Admitting: Family Medicine

## 2017-06-14 ENCOUNTER — Ambulatory Visit (INDEPENDENT_AMBULATORY_CARE_PROVIDER_SITE_OTHER): Payer: Medicare Other | Admitting: Family Medicine

## 2017-06-14 VITALS — BP 112/72 | Ht 69.0 in | Wt 177.1 lb

## 2017-06-14 DIAGNOSIS — R270 Ataxia, unspecified: Secondary | ICD-10-CM | POA: Diagnosis not present

## 2017-06-14 DIAGNOSIS — R3912 Poor urinary stream: Secondary | ICD-10-CM

## 2017-06-14 DIAGNOSIS — R0602 Shortness of breath: Secondary | ICD-10-CM | POA: Diagnosis not present

## 2017-06-14 DIAGNOSIS — N401 Enlarged prostate with lower urinary tract symptoms: Secondary | ICD-10-CM

## 2017-06-14 DIAGNOSIS — G2581 Restless legs syndrome: Secondary | ICD-10-CM | POA: Diagnosis not present

## 2017-06-14 DIAGNOSIS — I10 Essential (primary) hypertension: Secondary | ICD-10-CM | POA: Diagnosis not present

## 2017-06-14 MED ORDER — ZOSTER VAC RECOMB ADJUVANTED 50 MCG/0.5ML IM SUSR: 0.5000 mL | Freq: Once | INTRAMUSCULAR | 1 refills | Status: AC

## 2017-06-14 MED ORDER — ROPINIROLE HCL 2 MG PO TABS: ORAL_TABLET | ORAL | 4 refills | Status: DC

## 2017-06-14 NOTE — Patient Instructions (Signed)
DASH Eating Plan DASH stands for "Dietary Approaches to Stop Hypertension." The DASH eating plan is a healthy eating plan that has been shown to reduce high blood pressure (hypertension). It may also reduce your risk for type 2 diabetes, heart disease, and stroke. The DASH eating plan may also help with weight loss. What are tips for following this plan? General guidelines  Avoid eating more than 2,300 mg (milligrams) of salt (sodium) a day. If you have hypertension, you may need to reduce your sodium intake to 1,500 mg a day.  Limit alcohol intake to no more than 1 drink a day for nonpregnant women and 2 drinks a day for men. One drink equals 12 oz of beer, 5 oz of wine, or 1 oz of hard liquor.  Work with your health care provider to maintain a healthy body weight or to lose weight. Ask what an ideal weight is for you.  Get at least 30 minutes of exercise that causes your heart to beat faster (aerobic exercise) most days of the week. Activities may include walking, swimming, or biking.  Work with your health care provider or diet and nutrition specialist (dietitian) to adjust your eating plan to your individual calorie needs. Reading food labels  Check food labels for the amount of sodium per serving. Choose foods with less than 5 percent of the Daily Value of sodium. Generally, foods with less than 300 mg of sodium per serving fit into this eating plan.  To find whole grains, look for the word "whole" as the first word in the ingredient list. Shopping  Buy products labeled as "low-sodium" or "no salt added."  Buy fresh foods. Avoid canned foods and premade or frozen meals. Cooking  Avoid adding salt when cooking. Use salt-free seasonings or herbs instead of table salt or sea salt. Check with your health care provider or pharmacist before using salt substitutes.  Do not fry foods. Cook foods using healthy methods such as baking, boiling, grilling, and broiling instead.  Cook with  heart-healthy oils, such as olive, canola, soybean, or sunflower oil. Meal planning   Eat a balanced diet that includes: ? 5 or more servings of fruits and vegetables each day. At each meal, try to fill half of your plate with fruits and vegetables. ? Up to 6-8 servings of whole grains each day. ? Less than 6 oz of lean meat, poultry, or fish each day. A 3-oz serving of meat is about the same size as a deck of cards. One egg equals 1 oz. ? 2 servings of low-fat dairy each day. ? A serving of nuts, seeds, or beans 5 times each week. ? Heart-healthy fats. Healthy fats called Omega-3 fatty acids are found in foods such as flaxseeds and coldwater fish, like sardines, salmon, and mackerel.  Limit how much you eat of the following: ? Canned or prepackaged foods. ? Food that is high in trans fat, such as fried foods. ? Food that is high in saturated fat, such as fatty meat. ? Sweets, desserts, sugary drinks, and other foods with added sugar. ? Full-fat dairy products.  Do not salt foods before eating.  Try to eat at least 2 vegetarian meals each week.  Eat more home-cooked food and less restaurant, buffet, and fast food.  When eating at a restaurant, ask that your food be prepared with less salt or no salt, if possible. What foods are recommended? The items listed may not be a complete list. Talk with your dietitian about what   dietary choices are best for you. Grains Whole-grain or whole-wheat bread. Whole-grain or whole-wheat pasta. Brown rice. Oatmeal. Quinoa. Bulgur. Whole-grain and low-sodium cereals. Pita bread. Low-fat, low-sodium crackers. Whole-wheat flour tortillas. Vegetables Fresh or frozen vegetables (raw, steamed, roasted, or grilled). Low-sodium or reduced-sodium tomato and vegetable juice. Low-sodium or reduced-sodium tomato sauce and tomato paste. Low-sodium or reduced-sodium canned vegetables. Fruits All fresh, dried, or frozen fruit. Canned fruit in natural juice (without  added sugar). Meat and other protein foods Skinless chicken or turkey. Ground chicken or turkey. Pork with fat trimmed off. Fish and seafood. Egg whites. Dried beans, peas, or lentils. Unsalted nuts, nut butters, and seeds. Unsalted canned beans. Lean cuts of beef with fat trimmed off. Low-sodium, lean deli meat. Dairy Low-fat (1%) or fat-free (skim) milk. Fat-free, low-fat, or reduced-fat cheeses. Nonfat, low-sodium ricotta or cottage cheese. Low-fat or nonfat yogurt. Low-fat, low-sodium cheese. Fats and oils Soft margarine without trans fats. Vegetable oil. Low-fat, reduced-fat, or light mayonnaise and salad dressings (reduced-sodium). Canola, safflower, olive, soybean, and sunflower oils. Avocado. Seasoning and other foods Herbs. Spices. Seasoning mixes without salt. Unsalted popcorn and pretzels. Fat-free sweets. What foods are not recommended? The items listed may not be a complete list. Talk with your dietitian about what dietary choices are best for you. Grains Baked goods made with fat, such as croissants, muffins, or some breads. Dry pasta or rice meal packs. Vegetables Creamed or fried vegetables. Vegetables in a cheese sauce. Regular canned vegetables (not low-sodium or reduced-sodium). Regular canned tomato sauce and paste (not low-sodium or reduced-sodium). Regular tomato and vegetable juice (not low-sodium or reduced-sodium). Pickles. Olives. Fruits Canned fruit in a light or heavy syrup. Fried fruit. Fruit in cream or butter sauce. Meat and other protein foods Fatty cuts of meat. Ribs. Fried meat. Bacon. Sausage. Bologna and other processed lunch meats. Salami. Fatback. Hotdogs. Bratwurst. Salted nuts and seeds. Canned beans with added salt. Canned or smoked fish. Whole eggs or egg yolks. Chicken or turkey with skin. Dairy Whole or 2% milk, cream, and half-and-half. Whole or full-fat cream cheese. Whole-fat or sweetened yogurt. Full-fat cheese. Nondairy creamers. Whipped toppings.  Processed cheese and cheese spreads. Fats and oils Butter. Stick margarine. Lard. Shortening. Ghee. Bacon fat. Tropical oils, such as coconut, palm kernel, or palm oil. Seasoning and other foods Salted popcorn and pretzels. Onion salt, garlic salt, seasoned salt, table salt, and sea salt. Worcestershire sauce. Tartar sauce. Barbecue sauce. Teriyaki sauce. Soy sauce, including reduced-sodium. Steak sauce. Canned and packaged gravies. Fish sauce. Oyster sauce. Cocktail sauce. Horseradish that you find on the shelf. Ketchup. Mustard. Meat flavorings and tenderizers. Bouillon cubes. Hot sauce and Tabasco sauce. Premade or packaged marinades. Premade or packaged taco seasonings. Relishes. Regular salad dressings. Where to find more information:  National Heart, Lung, and Blood Institute: www.nhlbi.nih.gov  American Heart Association: www.heart.org Summary  The DASH eating plan is a healthy eating plan that has been shown to reduce high blood pressure (hypertension). It may also reduce your risk for type 2 diabetes, heart disease, and stroke.  With the DASH eating plan, you should limit salt (sodium) intake to 2,300 mg a day. If you have hypertension, you may need to reduce your sodium intake to 1,500 mg a day.  When on the DASH eating plan, aim to eat more fresh fruits and vegetables, whole grains, lean proteins, low-fat dairy, and heart-healthy fats.  Work with your health care provider or diet and nutrition specialist (dietitian) to adjust your eating plan to your individual   calorie needs. This information is not intended to replace advice given to you by your health care provider. Make sure you discuss any questions you have with your health care provider. Document Released: 09/30/2011 Document Revised: 10/04/2016 Document Reviewed: 10/04/2016 Elsevier Interactive Patient Education  2017 Elsevier Inc.  

## 2017-06-14 NOTE — Progress Notes (Signed)
   Subjective:    Patient ID: Gerald Hall, male    DOB: 1934/11/30, 82 y.o.   MRN: [id number]  Hypertension  This is a chronic problem. The current episode started more than 1 year ago. Associated symptoms include shortness of breath. Pertinent negatives include no chest pain or headaches.  Has had high blood pressure for years takes medicine watch his diet Still smokes he knows he needs to quit he will cut it down to half pack a day Does have some difficulty sleeping at night. Trazodone seems to help Patient with significant ataxia difficulty moving around with a walker eels like he is given a fall denies unilateral numbness weakness Denies tremors in the hand denies difficulty chewing or swallowing  One hctz 25 mg Q am.Eats healthy and does not exercise. Patient want [place] to refer to Pt for balance issues.  Incontinence-uses wheeled walker all the time Patient has intermittent incontinence. Is looking at some different modalities that might help. Denies abdominal pain and hematuria. Restless legs bothers him mid to late afternoon also bothers him again at evening time he states if he takes the medicine earlier in the day it helps that but then he has trouble in the evening if he takes in the evening as trouble in the middle of the day   Review of Systems  Constitutional: Negative for activity change, fatigue and fever.  HENT: Negative for congestion.   Respiratory: Positive for shortness of breath. Negative for cough.   Cardiovascular: Negative for chest pain and leg swelling.  Neurological: Negative for headaches.       Objective:   Physical Exam  Constitutional: He appears well-nourished. No distress.  Cardiovascular: Normal rate, regular rhythm and normal heart sounds.   No murmur heard. Pulmonary/Chest: Effort normal and breath sounds normal. No respiratory distress.  Musculoskeletal: He exhibits no edema.  Lymphadenopathy:    He has no cervical adenopathy.  Neurological:  He is alert.  Psychiatric: His behavior is normal.  Vitals reviewed.  Face to faceFor the purpose of home health/physical therapy. Patient would benefit from physical therapy.   25 minutes was spent with the patient. Greater than half the time was spent in discussion and answering questions and counseling regarding the issues that the patient came in for today.     Assessment & Plan:  PT at physical therapy on [address]  Blood pressure good control continue current measures  Restless legs-see discussion above I recommend trying Requip 2 mg late afternoon then an additional 2 mg at bedtime hopefully this will help  I offered to set up patient with urology he does not feel that is necessary currently  Ataxia referral for physical therapy await the results of there-lab work not indicated currently but will do additional lab work later this year  Follow-up patient in approximately 6 months  Patient does relate some shortness of breath at rest and some with activity it does appear that this shortness of breath is related to smoking and probable COPD he does not want to go on any type of inhalers currently I do not feel chest x-ray or PFT indicated currently-he does not want to go through additional testing-O2 sats ration 97%-patient encouraged to reduce smoking-he is not inclined to quit

## 2017-06-22 ENCOUNTER — Ambulatory Visit (HOSPITAL_COMMUNITY): Payer: Medicare Other | Attending: Family Medicine | Admitting: Physical Therapy

## 2017-06-22 ENCOUNTER — Encounter (HOSPITAL_COMMUNITY): Payer: Self-pay | Admitting: Physical Therapy

## 2017-06-22 DIAGNOSIS — R2689 Other abnormalities of gait and mobility: Secondary | ICD-10-CM | POA: Insufficient documentation

## 2017-06-22 DIAGNOSIS — R2681 Unsteadiness on feet: Secondary | ICD-10-CM | POA: Diagnosis not present

## 2017-06-22 DIAGNOSIS — M6281 Muscle weakness (generalized): Secondary | ICD-10-CM | POA: Insufficient documentation

## 2017-06-22 NOTE — Patient Instructions (Signed)
   STANDING HEEL RAISES (AT KITCHEN COUNTER)  While standing, raise up on your toes as you lift your heels off the ground.  Repeat 15 times, 2-3 times per day.    STANDING HAMSTRING CURLS  While standing, bend your knee so that your heel moves towards your buttock. Lower back down until first contact with floor and repeat 10-15 times, each leg, 2 times a day.  Keep knees in-line with one another.     Stand to [employer] in standing near the front of the chair. Cross your arms over your chest or out of front of you (so they don't assist you). Very SLOWLY (should take you 4 seconds), start sitting. -Keep your bottom back -Don't let your knees travel past your toes -Keep knee's pointing forward (don't let touch) -Have your feet firmly planted in ground  Repeat 10 times, 2-3 times per day.  BALANCE EXERCISE  Standing at kitchen counter, take a step forward with your right leg and take your hands off the counter, trying to maintain this position.  Hold for 15 seconds then switch sides.  Repeat 3 times each leg, twice a day.

## 2017-06-22 NOTE — Therapy (Signed)
[place], [place], [address] Phone: [phone]   Fax:  [phone]  Physical Therapy Evaluation  Patient Details  Name: Gerald Hall MRN: [id number] Date of Birth: 1934/11/21 Referring Provider: Sallee Lange   Encounter Date: 06/22/2017      PT End of Session - 06/22/17 1527    Visit Number 1   Number of Visits 9   Date for PT Re-Evaluation 07/20/17   Authorization Type Medicare and [employer]    Authorization Time Period 06/22/17 to 07/23/17   Authorization - Visit Number 1   Authorization - Number of Visits 10   PT Start Time 2841   PT Stop Time 1427   PT Time Calculation (min) 39 min   Equipment Utilized During Treatment Gait belt   Activity Tolerance Patient tolerated treatment well   Behavior During Therapy [facility] for tasks assessed/performed      Past Medical History:  Diagnosis Date  . Arthritis   . Balance problem    uses a walker  . Blood in urine    Sees urology yearly  . Deafness in right ear age 61  . Hyperlipidemia   . Hypertension   . Temporal arteritis ([place]) 2016  . Urinary incontinence     Past Surgical History:  Procedure Laterality Date  . BACK SURGERY  2010  . CATARACT EXTRACTION Bilateral 07/30/14, 08/13/2014  . CERVICAL SPINE SURGERY  09/01/15   C 4-5 , [place]  . COLONOSCOPY  12/19/2007   RMR: 1. External hemorrohids, otherwise normal rectum.2. Normal colon. 3. Normal terminal ileum.  . COLONOSCOPY N/A 12/13/2014   Procedure: COLONOSCOPY;  Surgeon: Daneil Dolin, MD;  [facility];  Service: Endoscopy;  Laterality: N/A;  11:15 Pt Request Time  . KNEE SURGERY Left    around 2000, arthroscopic  . TOTAL HIP ARTHROPLASTY Right 02/23/2017   Procedure: TOTAL HIP ARTHROPLASTY ANTERIOR APPROACH;  Surgeon: Hessie Knows, MD;  Location: [facility] ORS;  Service: Orthopedics;  Laterality: Right;    There were no vitals filed for this visit.        Subjective Assessment - 06/22/17 1349    Subjective Patient arrives stating that he had his hip done earlier this year (per EPIC, May 2018 anterior approach); his main compliant is that he has no balance without his walker. He has not had any falls or close calls as long as he has the walker. He can do pretty much what he needs to do as a whole otherwise.    Pertinent History HTN, BPH, hx lumbar and cervical stenosis, macular degeneration, has used walker for 2 years, history of cervical/lumbar/L knee surgeries, R anterior hip 2018.   How long can you sit comfortably? not limited    How long can you stand comfortably? 5-10 minutes with walker    How long can you walk comfortably? few hundred feet with walker, limited by knee pain    Patient Stated Goals improve balance and strength    Currently in Pain? No/denies            [facility] PT Assessment - 06/22/17 0001      Assessment   Medical Diagnosis balance, restless leg    Referring Provider Nicki Reaper Luking    Onset Date/Surgical Date --  R anterior hip May 2018; balance has been chronic issue    Next MD Visit Dr. Wolfgang Phoenix in 4-6 months    Prior Therapy PT here last year  Precautions   Precautions Anterior Hip   Precaution Comments R     Restrictions   Weight Bearing Restrictions No     Balance Screen   Has the patient fallen in the past 6 months No   Has the patient had a decrease in activity level because of a fear of falling?  Yes   Is the patient reluctant to leave their home because of a fear of falling?  No     Prior Function   Level of Independence Independent;Independent with basic ADLs;Independent with gait;Independent with transfers   Vocation Retired     Strength   Right Hip Flexion 4/5   Right Hip ABduction 3/5   Left Hip Flexion 4/5   Left Hip ABduction 3/5   Right Knee Flexion 4+/5   Right Knee Extension 5/5   Left Knee Flexion 4/5   Left Knee Extension 4+/5   Right Ankle Dorsiflexion 5/5   Left Ankle  Dorsiflexion 5/5     Flexibility   Hamstrings moderate limitation L HS, mild limitation R HS      Transfers   Five time sit to stand comments  20.1 no UEs standard chair      High Level Balance   High Level Balance Comments SLS 1-3 seconds, TUG 15 with rollator             Objective measurements completed on examination: See above findings.          [facility] Adult PT Treatment/Exercise - 06/22/17 0001      Exercises   Exercises Knee/Hip     Knee/Hip Exercises: Standing   Heel Raises Both;1 set;20 reps   Heel Raises Limitations heel and toe    Knee Flexion Both;1 set;15 reps   Knee Flexion Limitations 0#, 3 second holds              Balance Exercises - 06/22/17 1418      Balance Exercises: Standing   Tandem Stance Eyes open;2 reps;20 secs;Other (comment)  one foot forwadr            PT Education - 06/22/17 1526    Education provided Yes   Education Details exam findings, POC, HEP; his goals may not be fully realistic given chronicity of some impairemnts but we will certainly try and base continuation of skilled PT services on findings at re-assess in 4 weeks    Person(s) Educated Patient   Methods Explanation;Demonstration;Handout   Comprehension Verbalized understanding;Returned demonstration;Need further instruction          PT Short Term Goals - 06/22/17 1533      PT SHORT TERM GOAL #1   Title Pt will demo consistency and independence with his HEP to improve strength and mobility.    Time 2   Period Weeks   Status New   Target Date 07/06/17     PT SHORT TERM GOAL #2   Title Pt will demo resolutation of hamstring length/flexibility impairments B in order to improve posture and gait    Time 2   Period Weeks   Status New     PT SHORT TERM GOAL #3   Title Patient to be able to perform sit to stand without UEs and without multiple attempts and no unsteadiness upon reaching standing in order to demonstrate improved functional mobility    Time  2   Period Weeks   Status New           PT Long Term Goals - 06/22/17 1535  PT LONG TERM GOAL #1   Title Pt will demo strength as having improved by at least 1 MMT grade in all tested groups in order to improve balance and functional activity    Time 4   Period Weeks   Status New   Target Date 07/20/17     PT LONG TERM GOAL #2   Title Pt will perform 5x sit to stand without UE support in less than 12 sec, to improve his safety/independence getting in and out of chairs at home.    Time 4   Period Weeks   Status New     PT LONG TERM GOAL #3   Title Pt will perform TUG in less than 12 sec, with LRAD to indicate he is at a decreased risk of falling in the community.    Time 4   Period Weeks   Status New     PT LONG TERM GOAL #4   Title Pt will perform SLS on each LE up to 10 sec without LOB, 3/5 trials, to decrease his risk of falling during ambulation/stair negotiation.    Time 4   Period Weeks   Status New                Plan - 06/22/17 1530    Clinical Impression Statement Patient arrives after having had R anterior hip surgery done in May 2018; he states that he has been keeping up with his HEP until he had surgery, then he transitioned to his hip exercises, but he has been working on his old HEP from here before. He states he wants to get rid of his rollator and get better than he was before. Examination reveals functional muscle weakness, difficulty with functional mobility especially and including sit to stand, difficulty walking, and general presentation as a moderate fall risk at this time. Recommend skilled PT services to address functional deficits, reduce fall risk, and assist in return to optimal level of function.    History and Personal Factors relevant to plan of care: R anterior hip May 2018; chronicity of general impairments including PT for similar problem late 2017/2018   Clinical Presentation Stable   Clinical Presentation due to: post-op  status/chronic impairment    Clinical Decision Making Low   Rehab Potential Good   Clinical Impairments Affecting Rehab Potential (+) motivated to participate, moderate success with last course of PT; (-) chronicity of impairment, recurrence of impairment    PT Frequency 2x / week   PT Duration 4 weeks   PT Treatment/Interventions ADLs/Self Care Home Management;Cryotherapy;DME Instruction;Gait training;Stair training;Functional mobility training;Therapeutic activities;Therapeutic exercise;Balance training;Neuromuscular re-education;Patient/family education;Electrical Stimulation;Iontophoresis 4mg /ml Dexamethasone;Moist Heat;Orthotic Fit/Training;Manual techniques   PT Next Visit Plan review initial eval/goals, HEP; Note R anterior hip done in May 2018. Functoinal sterngth and balance training primarily. May cautiously trial quad cane as appropriate and safe to do so.    PT Home Exercise Plan Eval: continue HHPT HEP and old HEP from this clinic; add heel raises, standing HS curls, eccentric stand to sit, balance with one foot forward    Consulted and Agree with Plan of Care Patient      Patient will benefit from skilled therapeutic intervention in order to improve the following deficits and impairments:  Abnormal gait, Decreased coordination, Decreased mobility, Postural dysfunction, Decreased strength, Decreased balance, Difficulty walking, Decreased safety awareness, Impaired flexibility  Visit Diagnosis: Unsteadiness on feet - Plan: PT plan of care cert/re-cert  Muscle weakness (generalized) - Plan: PT plan of care cert/re-cert  Other abnormalities of gait and mobility - Plan: PT plan of care cert/re-cert      G-Codes - 37/10/62 1536    Functional Assessment Tool Used (Outpatient Only) Based on skilled clinical assessment of strength, functional mobility, gait, balance    Functional Limitation Mobility: Walking and moving around   Mobility: Walking and Moving Around Current Status  ([id number]) At least 40 percent but less than 60 percent impaired, limited or restricted   Mobility: Walking and Moving Around Goal Status [phone]) At least 20 percent but less than 40 percent impaired, limited or restricted       Problem List Patient Active Problem List   Diagnosis Date Noted  . Avascular necrosis of bone of right hip ([place]) 02/23/2017  . Primary osteoarthritis involving multiple joints 02/21/2017  . Smoker 02/21/2017  . Macular degeneration 11/10/2016  . Insomnia 07/20/2016  . Restless legs 07/20/2016  . Spinal stenosis in cervical region 10/14/2015  . Senile purpura ([place]) 04/18/2015  . Osteopenia 01/28/2015  . Spinal stenosis of lumbar region 01/20/2015  . BPH (benign prostatic hyperplasia) 01/20/2015  . Diverticulosis of colon without hemorrhage   . Encounter for screening colonoscopy 11/21/2014  . HTN (hypertension), benign 04/17/2014  . Temporal arteritis ([place]) 04/17/2014    Deniece Ree PT, DPT [phone]  [place] [address] [place], [place], [address] Phone: [phone]   Fax:  [phone]  Name: Gerald Hall MRN: [id number] Date of Birth: [birth year]

## 2017-06-29 ENCOUNTER — Ambulatory Visit (HOSPITAL_COMMUNITY): Payer: Medicare Other | Attending: Family Medicine

## 2017-06-29 DIAGNOSIS — M6281 Muscle weakness (generalized): Secondary | ICD-10-CM | POA: Diagnosis not present

## 2017-06-29 DIAGNOSIS — R2689 Other abnormalities of gait and mobility: Secondary | ICD-10-CM | POA: Insufficient documentation

## 2017-06-29 DIAGNOSIS — R2681 Unsteadiness on feet: Secondary | ICD-10-CM | POA: Insufficient documentation

## 2017-06-29 NOTE — Therapy (Signed)
[place], [place], [address] Phone: [phone]   Fax:  [phone]  Physical Therapy Treatment  Patient Details  Name: Gerald Hall MRN: [id number] Date of Birth: [birth year] Referring Provider: Sallee Lange  Encounter Date: 06/29/2017      PT End of Session - 06/29/17 1307    Visit Number 2   Number of Visits 9   Date for PT Re-Evaluation 07/20/17   Authorization Type Medicare and [employer]    Authorization Time Period 06/22/17 to 07/23/17   Authorization - Visit Number 2   Authorization - Number of Visits 10   PT Start Time 0623   PT Stop Time 1348   PT Time Calculation (min) 44 min   Equipment Utilized During Treatment Gait belt   Activity Tolerance Patient tolerated treatment well;No increased pain   Behavior During Therapy WFL for tasks assessed/performed      Past Medical History:  Diagnosis Date  . Arthritis   . Balance problem    uses a walker  . Blood in urine    Sees urology yearly  . Deafness in right ear age 82  . Hyperlipidemia   . Hypertension   . Temporal arteritis ([place]) 2016  . Urinary incontinence     Past Surgical History:  Procedure Laterality Date  . BACK SURGERY  2010  . CATARACT EXTRACTION Bilateral 07/30/14, 08/13/2014  . CERVICAL SPINE SURGERY  09/01/15   C 4-5 , [place]  . COLONOSCOPY  12/19/2007   RMR: 1. External hemorrohids, otherwise normal rectum.2. Normal colon. 3. Normal terminal ileum.  . COLONOSCOPY N/A 12/13/2014   Procedure: COLONOSCOPY;  Surgeon: Daneil Dolin, MD;  [facility];  Service: Endoscopy;  Laterality: N/A;  11:15 Pt Request Time  . KNEE SURGERY Left    around 2000, arthroscopic  . TOTAL HIP ARTHROPLASTY Right 02/23/2017   Procedure: TOTAL HIP ARTHROPLASTY ANTERIOR APPROACH;  Surgeon: Hessie Knows, MD;  Location: [facility] ORS;  Service: Orthopedics;  Laterality: Right;    There were no vitals filed for this visit.       Subjective Assessment - 06/29/17 1306    Subjective No reports of pain or recent falls.  Reports compliance with HEP without questions.  Feels his balance and weakness are biggest concern currently   Pertinent History HTN, BPH, hx lumbar and cervical stenosis, macular degeneration, has used walker for 2 years, history of cervical/lumbar/L knee surgeries, R anterior hip 2018.   Patient Stated Goals improve balance and strength    Currently in Pain? No/denies            [facility] PT Assessment - 06/29/17 0001      Assessment   Medical Diagnosis balance, restless leg    Referring Provider [name]   Onset Date/Surgical Date --  Rt anterior hip May 2018; balance has been chronic issue   Next MD Visit Dr. Wolfgang Phoenix in 4-6 months    Prior Therapy PT here last year      Precautions   Precautions Anterior Hip   Precaution Comments Rt                      [facility] Adult PT Treatment/Exercise - 06/29/17 0001      Knee/Hip Exercises: Stretches   Active Hamstring Stretch 2 reps;30 seconds   Active Hamstring Stretch Limitations supine     Knee/Hip Exercises: Standing   Heel Raises Both;1 set;20 reps  Heel Raises Limitations heel and toe incline slope   Knee Flexion Both;1 set;15 reps   Knee Flexion Limitations 0#, 3 second holds    Forward Lunges 15 reps   Forward Lunges Limitations 6 in step with HHA; noted crepitus lt knee, no reports of pain   Functional Squat 10 reps   Other Standing Knee Exercises side step over 6in hurdles; sidestep 3RT in // bars minimal HHA   Other Standing Knee Exercises marching minimal HHA 10x5" holds; tandem stance 3x 30":     Knee/Hip Exercises: Seated   Sit to Sand 10 reps;without UE support  eccentric control                PT Education - 06/29/17 1542    Education provided Yes   Education Details Reviewed goals, assured compliance and proper technique wiht HEP and copy of eval given to pt   Person(s) Educated Patient    Methods Explanation;Demonstration;Handout   Comprehension Verbalized understanding;Returned demonstration;Need further instruction          PT Short Term Goals - 06/22/17 1533      PT SHORT TERM GOAL #1   Title Pt will demo consistency and independence with his HEP to improve strength and mobility.    Time 2   Period Weeks   Status New   Target Date 07/06/17     PT SHORT TERM GOAL #2   Title Pt will demo resolutation of hamstring length/flexibility impairments B in order to improve posture and gait    Time 2   Period Weeks   Status New     PT SHORT TERM GOAL #3   Title Patient to be able to perform sit to stand without UEs and without multiple attempts and no unsteadiness upon reaching standing in order to demonstrate improved functional mobility    Time 2   Period Weeks   Status New           PT Long Term Goals - 06/22/17 1535      PT LONG TERM GOAL #1   Title Pt will demo strength as having improved by at least 1 MMT grade in all tested groups in order to improve balance and functional activity    Time 4   Period Weeks   Status New   Target Date 07/20/17     PT LONG TERM GOAL #2   Title Pt will perform 5x sit to stand without UE support in less than 12 sec, to improve his safety/independence getting in and out of chairs at home.    Time 4   Period Weeks   Status New     PT LONG TERM GOAL #3   Title Pt will perform TUG in less than 12 sec, with LRAD to indicate he is at a decreased risk of falling in the community.    Time 4   Period Weeks   Status New     PT LONG TERM GOAL #4   Title Pt will perform SLS on each LE up to 10 sec without LOB, 3/5 trials, to decrease his risk of falling during ambulation/stair negotiation.    Time 4   Period Weeks   Status New               Plan - 06/29/17 1313    Clinical Impression Statement Reviewed goals, assured compliance and proper techniques with HEP and copy of eval given to pt.  Pt able to verbalize  precautions with anterior hip replacement  and importance of proper AD for safety.  Session focus on functional strengthening and balance training with min A for safety through sessions.  No reports of pain through session, was limited by fatigue wiht tasks.   Rehab Potential Good   Clinical Impairments Affecting Rehab Potential (+) motivated to participate, moderate success with last course of PT; (-) chronicity of impairment, recurrence of impairment    PT Frequency 2x / week   PT Duration 4 weeks   PT Treatment/Interventions ADLs/Self Care Home Management;Cryotherapy;DME Instruction;Gait training;Stair training;Functional mobility training;Therapeutic activities;Therapeutic exercise;Balance training;Neuromuscular re-education;Patient/family education;Electrical Stimulation;Iontophoresis 4mg /ml Dexamethasone;Moist Heat;Orthotic Fit/Training;Manual techniques   PT Next Visit Plan Functional sterngth and balance training primarily. May cautiously trial quad cane as appropriate and safe to do so.    PT Home Exercise Plan Eval: continue HHPT HEP and old HEP from this clinic; add heel raises, standing HS curls, eccentric stand to sit, balance with one foot forward       Patient will benefit from skilled therapeutic intervention in order to improve the following deficits and impairments:  Abnormal gait, Decreased coordination, Decreased mobility, Postural dysfunction, Decreased strength, Decreased balance, Difficulty walking, Decreased safety awareness, Impaired flexibility  Visit Diagnosis: Unsteadiness on feet  Muscle weakness (generalized)  Other abnormalities of gait and mobility     Problem List Patient Active Problem List   Diagnosis Date Noted  . Avascular necrosis of bone of right hip ([place]) 02/23/2017  . Primary osteoarthritis involving multiple joints 02/21/2017  . Smoker 02/21/2017  . Macular degeneration 11/10/2016  . Insomnia 07/20/2016  . Restless legs 07/20/2016  . Spinal  stenosis in cervical region 10/14/2015  . Senile purpura ([place]) 04/18/2015  . Osteopenia 01/28/2015  . Spinal stenosis of lumbar region 01/20/2015  . BPH (benign prostatic hyperplasia) 01/20/2015  . Diverticulosis of colon without hemorrhage   . Encounter for screening colonoscopy 11/21/2014  . HTN (hypertension), benign 04/17/2014  . Temporal arteritis [facility]) 04/17/2014   Ihor Austin, [place]; [place]  Aldona Lento 06/29/2017, 3:44 PM  [facility] [address] [place], [place], [address] Phone: [phone]   Fax:  [phone]  Name: Gerald Hall MRN: [id number] Date of Birth: 12/04/1934

## 2017-07-01 ENCOUNTER — Ambulatory Visit (HOSPITAL_COMMUNITY): Payer: Medicare Other

## 2017-07-01 DIAGNOSIS — R2681 Unsteadiness on feet: Secondary | ICD-10-CM | POA: Diagnosis not present

## 2017-07-01 DIAGNOSIS — R2689 Other abnormalities of gait and mobility: Secondary | ICD-10-CM | POA: Diagnosis not present

## 2017-07-01 DIAGNOSIS — M6281 Muscle weakness (generalized): Secondary | ICD-10-CM | POA: Diagnosis not present

## 2017-07-01 NOTE — Therapy (Signed)
[facility] [place], [place], [address] Phone: [phone]   Fax:  [phone]  Physical Therapy Treatment  Patient Details  Name: Gerald Hall MRN: [id number] Date of Birth: 02/11/1935 Referring Provider: Sallee Lange  Encounter Date: 07/01/2017      PT End of Session - 07/01/17 1648    Visit Number 3   Number of Visits 9   Date for PT Re-Evaluation 07/20/17   Authorization Type Medicare and [employer]    Authorization Time Period 06/22/17 to 07/23/17   Authorization - Visit Number 3   Authorization - Number of Visits 10   PT Start Time 2297   PT Stop Time 9892  last 5 min at Nustep, no charge   PT Time Calculation (min) 46 min   Equipment Utilized During Treatment Gait belt   Activity Tolerance Patient tolerated treatment well;No increased pain   Behavior During Therapy WFL for tasks assessed/performed      Past Medical History:  Diagnosis Date  . Arthritis   . Balance problem    uses a walker  . Blood in urine    Sees urology yearly  . Deafness in right ear age 10  . Hyperlipidemia   . Hypertension   . Temporal arteritis ([place]) 2016  . Urinary incontinence     Past Surgical History:  Procedure Laterality Date  . BACK SURGERY  2010  . CATARACT EXTRACTION Bilateral 07/30/14, 08/13/2014  . CERVICAL SPINE SURGERY  09/01/15   C 4-5 , [place]  . COLONOSCOPY  12/19/2007   RMR: 1. External hemorrohids, otherwise normal rectum.2. Normal colon. 3. Normal terminal ileum.  . COLONOSCOPY N/A 12/13/2014   Procedure: COLONOSCOPY;  Surgeon: Daneil Dolin, MD;  [facility];  Service: Endoscopy;  Laterality: N/A;  11:15 Pt Request Time  . KNEE SURGERY Left    around 2000, arthroscopic  . TOTAL HIP ARTHROPLASTY Right 02/23/2017   Procedure: TOTAL HIP ARTHROPLASTY ANTERIOR APPROACH;  Surgeon: Hessie Knows, MD;  Location: [facility];  Service: Orthopedics;  Laterality: Right;    There were no  vitals filed for this visit.      Subjective Assessment - 07/01/17 1645    Subjective Pt stated he is tired today, difficult coming in to later apt today.  Reports compliance iwth HEP daily.  Feels his balance and weakness are biggest concern currently.     Pertinent History HTN, BPH, hx lumbar and cervical stenosis, macular degeneration, has used walker for 2 years, history of cervical/lumbar/L knee surgeries, R anterior hip 2018.   Patient Stated Goals improve balance and strength    Currently in Pain? No/denies                         [facility] Adult PT Treatment/Exercise - 07/01/17 0001      Knee/Hip Exercises: Aerobic   Nustep 5' L2 no charge at [facility]     Knee/Hip Exercises: Standing   Heel Raises Both;1 set;20 reps   Heel Raises Limitations heel and toe incline slope   Knee Flexion Both;1 set;15 reps   Knee Flexion Limitations 0#, 3 second holds    Forward Lunges 15 reps   Forward Lunges Limitations 4 in step, no crepitus noted Lt knee today   Lateral Step Up Both;15 reps;Hand Hold: 2;Step Height: 4"   Functional Squat 15 reps   Functional Squat Limitations good form noted   Rocker Board 2  minutes   Rocker Board Limitations R/L with 1 HHA   SLS 5 attempts ~4" BLE   SLS with Vectors 3x 5" BLE minor HHA   Other Standing Knee Exercises side step over 6in hurdles; sidestep 4RT in // bars minimal HHA 2 sets with RTB   Other Standing Knee Exercises marching minimal HHA 10x5" holds; tandem stance 3x 30" 2 sets on foam     Knee/Hip Exercises: Seated   Sit to Sand 10 reps;without UE support  eccentric control                  PT Short Term Goals - 06/22/17 1533      PT SHORT TERM GOAL #1   Title Pt will demo consistency and independence with his HEP to improve strength and mobility.    Time 2   Period Weeks   Status New   Target Date 07/06/17     PT SHORT TERM GOAL #2   Title Pt will demo resolutation of hamstring length/flexibility impairments B in  order to improve posture and gait    Time 2   Period Weeks   Status New     PT SHORT TERM GOAL #3   Title Patient to be able to perform sit to stand without UEs and without multiple attempts and no unsteadiness upon reaching standing in order to demonstrate improved functional mobility    Time 2   Period Weeks   Status New           PT Long Term Goals - 06/22/17 1535      PT LONG TERM GOAL #1   Title Pt will demo strength as having improved by at least 1 MMT grade in all tested groups in order to improve balance and functional activity    Time 4   Period Weeks   Status New   Target Date 07/20/17     PT LONG TERM GOAL #2   Title Pt will perform 5x sit to stand without UE support in less than 12 sec, to improve his safety/independence getting in and out of chairs at home.    Time 4   Period Weeks   Status New     PT LONG TERM GOAL #3   Title Pt will perform TUG in less than 12 sec, with LRAD to indicate he is at a decreased risk of falling in the community.    Time 4   Period Weeks   Status New     PT LONG TERM GOAL #4   Title Pt will perform SLS on each LE up to 10 sec without LOB, 3/5 trials, to decrease his risk of falling during ambulation/stair negotiation.    Time 4   Period Weeks   Status New               Plan - 07/01/17 1722    Clinical Impression Statement Session foucs with functional strengthening and balance training.  Pt progressing well with reports of increased ease with sit to stand wtih HEP and noted improved eccentric control.  Added lateral step ups and increased reps for activity tolerance and strengthening.  Min A required for safety with balance activities for safety.  No reports of pain, was limited by fatigue with tasks.  Pt requested to ride Nustep at [facility] for strengthening/activity tolerance, not included in billing.   Rehab Potential Good   Clinical Impairments Affecting Rehab Potential (+) motivated to participate, moderate success with  last course of PT; (-)  chronicity of impairment, recurrence of impairment    PT Frequency 2x / week   PT Duration 4 weeks   PT Treatment/Interventions ADLs/Self Care Home Management;Cryotherapy;DME Instruction;Gait training;Stair training;Functional mobility training;Therapeutic activities;Therapeutic exercise;Balance training;Neuromuscular re-education;Patient/family education;Electrical Stimulation;Iontophoresis 4mg /ml Dexamethasone;Moist Heat;Orthotic Fit/Training;Manual techniques   PT Next Visit Plan Functional sterngth and balance training primarily. May cautiously trial quad cane as appropriate and safe to do so.    PT Home Exercise Plan Eval: continue HHPT HEP and old HEP from this clinic; add heel raises, standing HS curls, eccentric stand to sit, balance with one foot forward       Patient will benefit from skilled therapeutic intervention in order to improve the following deficits and impairments:  Abnormal gait, Decreased coordination, Decreased mobility, Postural dysfunction, Decreased strength, Decreased balance, Difficulty walking, Decreased safety awareness, Impaired flexibility  Visit Diagnosis: Unsteadiness on feet  Muscle weakness (generalized)  Other abnormalities of gait and mobility     Problem List Patient Active Problem List   Diagnosis Date Noted  . Avascular necrosis of bone of right hip ([place]) 02/23/2017  . Primary osteoarthritis involving multiple joints 02/21/2017  . Smoker 02/21/2017  . Macular degeneration 11/10/2016  . Insomnia 07/20/2016  . Restless legs 07/20/2016  . Spinal stenosis in cervical region 10/14/2015  . Senile purpura ([place]) 04/18/2015  . Osteopenia 01/28/2015  . Spinal stenosis of lumbar region 01/20/2015  . BPH (benign prostatic hyperplasia) 01/20/2015  . Diverticulosis of colon without hemorrhage   . Encounter for screening colonoscopy 11/21/2014  . HTN (hypertension), benign 04/17/2014  . Temporal arteritis ([place]) 04/17/2014    Ihor Austin, [place]; [place]  Aldona Lento 07/01/2017, 5:29 PM  [facility] [address] [place], [place], [address] Phone: [phone]   Fax:  [phone]  Name: Gerald Hall MRN: [id number] Date of Birth: [birth year]

## 2017-07-05 ENCOUNTER — Ambulatory Visit (HOSPITAL_COMMUNITY): Payer: Medicare Other

## 2017-07-05 DIAGNOSIS — R2689 Other abnormalities of gait and mobility: Secondary | ICD-10-CM | POA: Diagnosis not present

## 2017-07-05 DIAGNOSIS — R2681 Unsteadiness on feet: Secondary | ICD-10-CM | POA: Diagnosis not present

## 2017-07-05 DIAGNOSIS — M6281 Muscle weakness (generalized): Secondary | ICD-10-CM | POA: Diagnosis not present

## 2017-07-05 NOTE — Therapy (Signed)
[facility] [place], [place], [address] Phone: [phone]   Fax:  [phone]  Physical Therapy Treatment  Patient Details  Name: Gerald Hall MRN: [id number] Date of Birth: [birth year] Referring Provider: Sallee Lange  Encounter Date: 07/05/2017      PT End of Session - 07/05/17 1125    Visit Number 4   Number of Visits 9   Date for PT Re-Evaluation 07/20/17   Authorization Type Medicare and [employer]    Authorization Time Period 06/22/17 to 07/23/17   Authorization - Visit Number 4   Authorization - Number of Visits 10   PT Start Time 3570   PT Stop Time 1203   PT Time Calculation (min) 45 min   Equipment Utilized During Treatment Gait belt   Activity Tolerance Patient tolerated treatment well;Patient limited by pain  Limited by Lt knee pain with CKC   Behavior During Therapy [facility] for tasks assessed/performed      Past Medical History:  Diagnosis Date  . Arthritis   . Balance problem    uses a walker  . Blood in urine    Sees urology yearly  . Deafness in right ear age 81  . Hyperlipidemia   . Hypertension   . Temporal arteritis ([place]) 2016  . Urinary incontinence     Past Surgical History:  Procedure Laterality Date  . BACK SURGERY  2010  . CATARACT EXTRACTION Bilateral 07/30/14, 08/13/2014  . CERVICAL SPINE SURGERY  09/01/15   C 4-5 , [place]  . COLONOSCOPY  12/19/2007   RMR: 1. External hemorrohids, otherwise normal rectum.2. Normal colon. 3. Normal terminal ileum.  . COLONOSCOPY N/A 12/13/2014   Procedure: COLONOSCOPY;  Surgeon: Daneil Dolin, MD;  [facility];  Service: Endoscopy;  Laterality: N/A;  11:15 Pt Request Time  . KNEE SURGERY Left    around 2000, arthroscopic  . TOTAL HIP ARTHROPLASTY Right 02/23/2017   Procedure: TOTAL HIP ARTHROPLASTY ANTERIOR APPROACH;  Surgeon: Hessie Knows, MD;  Location: [facility] OR[facility];  Service: Orthopedics;  Laterality: Right;    There  were no vitals filed for this visit.      Subjective Assessment - 07/05/17 1120    Subjective Pt stated he is doing well today, reports he has some pain in Lt knee today.     Pertinent History HTN, BPH, hx lumbar and cervical stenosis, macular degeneration, has used walker for 2 years, history of cervical/lumbar/L knee surgeries, R anterior hip 2018.   Patient Stated Goals improve balance and strength    Currently in Pain? Yes   Pain Score 3    Pain Location Knee   Pain Orientation Left   Pain Descriptors / Indicators Aching   Pain Type Chronic pain   Pain Onset More than a month ago   Aggravating Factors  standing, walking, squats   Pain Relieving Factors sitting, NWB   Effect of Pain on Daily Activities increased                         [facility] Adult PT Treatment/Exercise - 07/05/17 0001      Knee/Hip Exercises: Aerobic   Nustep 5' L3 no charge at [facility]     Knee/Hip Exercises: Standing   Heel Raises Both;1 set;20 reps   Heel Raises Limitations heel and toe incline slope   Functional Squat 20 reps   Functional Squat Limitations good form noted   SLS  with Vectors 5x5" BLR with min HHA   Other Standing Knee Exercises sidestep with RTB down line    Other Standing Knee Exercises marching minimal HHA 10x5" holds; tandem stance 3x 30" 2 sets on foam     Knee/Hip Exercises: Seated   Other Seated Knee/Hip Exercises sitting on dynadisc with cone rotation   Sit to Sand 10 reps;without UE support  eccentric control     Knee/Hip Exercises: Sidelying   Hip ABduction 15 reps   Hip ABduction Limitations LE against wall                  PT Short Term Goals - 06/22/17 1533      PT SHORT TERM GOAL #1   Title Pt will demo consistency and independence with his HEP to improve strength and mobility.    Time 2   Period Weeks   Status New   Target Date 07/06/17     PT SHORT TERM GOAL #2   Title Pt will demo resolutation of hamstring length/flexibility  impairments B in order to improve posture and gait    Time 2   Period Weeks   Status New     PT SHORT TERM GOAL #3   Title Patient to be able to perform sit to stand without UEs and without multiple attempts and no unsteadiness upon reaching standing in order to demonstrate improved functional mobility    Time 2   Period Weeks   Status New           PT Long Term Goals - 06/22/17 1535      PT LONG TERM GOAL #1   Title Pt will demo strength as having improved by at least 1 MMT grade in all tested groups in order to improve balance and functional activity    Time 4   Period Weeks   Status New   Target Date 07/20/17     PT LONG TERM GOAL #2   Title Pt will perform 5x sit to stand without UE support in less than 12 sec, to improve his safety/independence getting in and out of chairs at home.    Time 4   Period Weeks   Status New     PT LONG TERM GOAL #3   Title Pt will perform TUG in less than 12 sec, with LRAD to indicate he is at a decreased risk of falling in the community.    Time 4   Period Weeks   Status New     PT LONG TERM GOAL #4   Title Pt will perform SLS on each LE up to 10 sec without LOB, 3/5 trials, to decrease his risk of falling during ambulation/stair negotiation.    Time 4   Period Weeks   Status New               Plan - 07/05/17 1206    Clinical Impression Statement Continued session focus iwth functional stretngthening and balance training.  Pt continues to demonstrate weak gluteal musculature noted with gait.  Added resistance and distance with sidestepping for glut med strengthening with cueing to reduce ER.  Pt reported increased Lt knee pain wiht CKC, following reports moved to [facility] for pain control.  Began seated balance activity with reaching on dynadisc for abdominal strengthening to assist.  EOS on Nustep for activity tolerance and LE strengthening, not included in billing,.   Rehab Potential Good   Clinical Impairments Affecting Rehab  Potential (+) motivated to participate, moderate  success with last course of PT; (-) chronicity of impairment, recurrence of impairment    PT Frequency 2x / week   PT Duration 4 weeks   PT Treatment/Interventions ADLs/Self Care Home Management;Cryotherapy;DME Instruction;Gait training;Stair training;Functional mobility training;Therapeutic activities;Therapeutic exercise;Balance training;Neuromuscular re-education;Patient/family education;Electrical Stimulation;Iontophoresis 4mg /ml Dexamethasone;Moist Heat;Orthotic Fit/Training;Manual techniques   PT Next Visit Plan Functional sterngth and balance training primarily.  Caution with Lt knee pain, may need to switch to total body machine for squats for pain control.     PT Home Exercise Plan Eval: continue HHPT HEP and old HEP from this clinic; add heel raises, standing HS curls, eccentric stand to sit, balance with one foot forward       Patient will benefit from skilled therapeutic intervention in order to improve the following deficits and impairments:  Abnormal gait, Decreased coordination, Decreased mobility, Postural dysfunction, Decreased strength, Decreased balance, Difficulty walking, Decreased safety awareness, Impaired flexibility  Visit Diagnosis: Unsteadiness on feet  Muscle weakness (generalized)  Other abnormalities of gait and mobility     Problem List Patient Active Problem List   Diagnosis Date Noted  . Avascular necrosis of bone of right hip ([place]) 02/23/2017  . Primary osteoarthritis involving multiple joints 02/21/2017  . Smoker 02/21/2017  . Macular degeneration 11/10/2016  . Insomnia 07/20/2016  . Restless legs 07/20/2016  . Spinal stenosis in cervical region 10/14/2015  . Senile purpura ([place]) 04/18/2015  . Osteopenia 01/28/2015  . Spinal stenosis of lumbar region 01/20/2015  . BPH (benign prostatic hyperplasia) 01/20/2015  . Diverticulosis of colon without hemorrhage   . Encounter for screening colonoscopy  11/21/2014  . HTN (hypertension), benign 04/17/2014  . Temporal arteritis ([place]) 04/17/2014   Ihor Austin, [place]; [place]  Aldona Lento 07/05/2017, 12:15 PM  [facility] [address] [place], [place], [address] Phone: [phone]   Fax:  [phone]  Name: Gerald Hall MRN: [id number] Date of Birth: January 20, 1935

## 2017-07-08 ENCOUNTER — Ambulatory Visit (HOSPITAL_COMMUNITY): Payer: Medicare Other

## 2017-07-08 DIAGNOSIS — R2689 Other abnormalities of gait and mobility: Secondary | ICD-10-CM | POA: Diagnosis not present

## 2017-07-08 DIAGNOSIS — R2681 Unsteadiness on feet: Secondary | ICD-10-CM | POA: Diagnosis not present

## 2017-07-08 DIAGNOSIS — M6281 Muscle weakness (generalized): Secondary | ICD-10-CM | POA: Diagnosis not present

## 2017-07-08 NOTE — Therapy (Addendum)
[facility] [place], [place], [address] Phone: [phone]   Fax:  [phone]  Physical Therapy Treatment  Patient Details  Name: Gerald Hall MRN: [id number] Date of Birth: [birth year] Referring Provider: Sallee Lange  Encounter Date: 07/08/2017      PT End of Session - 07/08/17 1319    Visit Number 5   Number of Visits 9   Date for PT Re-Evaluation 07/20/17   Authorization Type [employer] and [employer]    Authorization Time Period 06/22/17 to 07/23/17   Authorization - Visit Number 5   Authorization - Number of Visits 10   PT Start Time 1310   PT Stop Time 3419   PT Time Calculation (min) 38 min   Activity Tolerance Patient tolerated treatment well;No increased pain;Patient limited by fatigue   Behavior During Therapy [facility] for tasks assessed/performed      Past Medical History:  Diagnosis Date  . Arthritis   . Balance problem    uses a walker  . Blood in urine    Sees urology yearly  . Deafness in right ear age 37  . Hyperlipidemia   . Hypertension   . Temporal arteritis ([place]) 2016  . Urinary incontinence     Past Surgical History:  Procedure Laterality Date  . BACK SURGERY  2010  . CATARACT EXTRACTION Bilateral 07/30/14, 08/13/2014  . CERVICAL SPINE SURGERY  09/01/15   C 4-5 , [place]  . COLONOSCOPY  12/19/2007   RMR: 1. External hemorrohids, otherwise normal rectum.2. Normal colon. 3. Normal terminal ileum.  . COLONOSCOPY N/A 12/13/2014   Procedure: COLONOSCOPY;  Surgeon: Daneil Dolin, MD;  [facility];  Service: Endoscopy;  Laterality: N/A;  11:15 Pt Request Time  . KNEE SURGERY Left    around 2000, arthroscopic  . TOTAL HIP ARTHROPLASTY Right 02/23/2017   Procedure: TOTAL HIP ARTHROPLASTY ANTERIOR APPROACH;  Surgeon: Hessie Knows, MD;  Location: [facility];  Service: Orthopedics;  Laterality: Right;    There were no vitals filed for this visit.      Subjective  Assessment - 07/08/17 1310    Subjective Pt reports HEP going well, notices improvmen tin balance. His Left knee continues to bother him quite a bit.    Pertinent History HTN, BPH, hx lumbar and cervical stenosis, macular degeneration, has used walker for 2 years, history of cervical/lumbar/L knee surgeries, R anterior hip 2018.   Currently in Pain? Yes   Pain Score 3    Pain Location --  left knee, anterior joint knee pain associated with stiffness                         [facility] Adult PT Treatment/Exercise - 07/08/17 0001      Knee/Hip Exercises: Stretches   Other Knee/Hip Stretches DKTC with legs in chair, wedge under pelvis   1x3 minutes    Other Knee/Hip Stretches SKTC with contrlateral neutral hip for felxros stretch  3x30sec manually assisted.      Knee/Hip Exercises: Standing   Other Standing Knee Exercises sidstepping in hallway: 2x6ft, 2lb cuffs   Other Standing Knee Exercises 3x30sec on airex in narrow stance   2x15 bilat heel riases      Knee/Hip Exercises: Seated   Knee/Hip Flexion 2x20, alteranting, VC for upright trunk    Sit to Sand 10 reps;without UE support;2 sets  VC for full upright,      Knee/Hip  Exercises: Supine   Other Supine Knee/Hip Exercises reverse curl up 2x15     Manual Therapy   Manual Therapy Myofascial release;Manual Traction   Myofascial Release Right lateral quads x 6 minutes    Manual Traction Left knee distration/distraction: x4 minutes   minimal effect on pain and stiffness in knee                  PT Short Term Goals - 06/22/17 1533      PT SHORT TERM GOAL #1   Title Pt will demo consistency and independence with his HEP to improve strength and mobility.    Time 2   Period Weeks   Status New   Target Date 07/06/17     PT SHORT TERM GOAL #2   Title Pt will demo resolutation of hamstring length/flexibility impairments B in order to improve posture and gait    Time 2   Period Weeks   Status New     PT  SHORT TERM GOAL #3   Title Patient to be able to perform sit to stand without UEs and without multiple attempts and no unsteadiness upon reaching standing in order to demonstrate improved functional mobility    Time 2   Period Weeks   Status New           PT Long Term Goals - 06/22/17 1535      PT LONG TERM GOAL #1   Title Pt will demo strength as having improved by at least 1 MMT grade in all tested groups in order to improve balance and functional activity    Time 4   Period Weeks   Status New   Target Date 07/20/17     PT LONG TERM GOAL #2   Title Pt will perform 5x sit to stand without UE support in less than 12 sec, to improve his safety/independence getting in and out of chairs at home.    Time 4   Period Weeks   Status New     PT LONG TERM GOAL #3   Title Pt will perform TUG in less than 12 sec, with LRAD to indicate he is at a decreased risk of falling in the community.    Time 4   Period Weeks   Status New     PT LONG TERM GOAL #4   Title Pt will perform SLS on each LE up to 10 sec without LOB, 3/5 trials, to decrease his risk of falling during ambulation/stair negotiation.    Time 4   Period Weeks   Status New               Plan - 07/08/17 1322    Clinical Impression Statement Focus on geenraluized LE strengthening, and multifocal balance training. Pt toelratign session very well, noted progress toward goal overall. Sidestepping activity progressively declines in dynamic stability, more consistent with neuroloical claudication (LSS) v. fatigue, but additional observation will be needed to correlate. Stretching this session with focus on lumbar flexion and opening of neuological spaces.   *With concerns over neurological involvement (LSS), reflexes assessed:  -RUE: 2+ (brachioradialis)  -LUE: 2+ (brachioradialis)  -RLE: 3+ (knee jerk)  -LLE: 1+ (knee jerk)      Rehab Potential Good   Clinical Impairments Affecting Rehab Potential (+) motivated to  participate, moderate success with last course of PT; (-) chronicity of impairment, recurrence of impairment    PT Frequency 2x / week   PT Duration 4 weeks   PT  Treatment/Interventions ADLs/Self Care Home Management;Cryotherapy;DME Instruction;Gait training;Stair training;Functional mobility training;Therapeutic activities;Therapeutic exercise;Balance training;Neuromuscular re-education;Patient/family education;Electrical Stimulation;Iontophoresis 4mg /ml Dexamethasone;Moist Heat;Orthotic Fit/Training;Manual techniques   PT Next Visit Plan cotninue hip strength, single leg dfunctional activity, and focal balance.    PT Home Exercise Plan Eval: continue HHPT HEP and old HEP from this clinic; add heel raises, standing HS curls, eccentric stand to sit, balance with one foot forward    Consulted and Agree with Plan of Care Patient      Patient will benefit from skilled therapeutic intervention in order to improve the following deficits and impairments:  Abnormal gait, Decreased coordination, Decreased mobility, Postural dysfunction, Decreased strength, Decreased balance, Difficulty walking, Decreased safety awareness, Impaired flexibility  Visit Diagnosis: Unsteadiness on feet  Muscle weakness (generalized)  Other abnormalities of gait and mobility     Problem List Patient Active Problem List   Diagnosis Date Noted  . Avascular necrosis of bone of right hip ([place]) 02/23/2017  . Primary osteoarthritis involving multiple joints 02/21/2017  . Smoker 02/21/2017  . Macular degeneration 11/10/2016  . Insomnia 07/20/2016  . Restless legs 07/20/2016  . Spinal stenosis in cervical region 10/14/2015  . Senile purpura ([place]) 04/18/2015  . Osteopenia 01/28/2015  . Spinal stenosis of lumbar region 01/20/2015  . BPH (benign prostatic hyperplasia) 01/20/2015  . Diverticulosis of colon without hemorrhage   . Encounter for screening colonoscopy 11/21/2014  . HTN (hypertension), benign 04/17/2014  .  Temporal arteritis ([place]) 04/17/2014   1:48 PM, 07/08/17 Etta Grandchild, PT, DPT Physical Therapist - [place] [phone] (Office)   Etta Grandchild 07/08/2017, 1:48 PM  [facility] [address] [place], [place], [address] Phone: [phone]   Fax:  [phone]  Name: Gerald Hall MRN: [id number] Date of Birth: 21-Dec-1934

## 2017-07-12 ENCOUNTER — Encounter (HOSPITAL_COMMUNITY): Payer: Self-pay | Admitting: Physical Therapy

## 2017-07-12 ENCOUNTER — Ambulatory Visit (HOSPITAL_COMMUNITY): Payer: Medicare Other | Admitting: Physical Therapy

## 2017-07-12 DIAGNOSIS — R2689 Other abnormalities of gait and mobility: Secondary | ICD-10-CM | POA: Diagnosis not present

## 2017-07-12 DIAGNOSIS — R2681 Unsteadiness on feet: Secondary | ICD-10-CM | POA: Diagnosis not present

## 2017-07-12 DIAGNOSIS — M6281 Muscle weakness (generalized): Secondary | ICD-10-CM

## 2017-07-12 NOTE — Therapy (Signed)
[place], [place], [address] Phone: [phone]   Fax:  [phone]  Physical Therapy Treatment  Patient Details  Name: Gerald Hall MRN: [id number] Date of Birth: 20-Jun-1935 Referring Provider: Sallee Lange  Encounter Date: 07/12/2017      PT End of Session - 07/12/17 1431    Visit Number 6   Number of Visits 9   Date for PT Re-Evaluation 07/20/17   Authorization Type Medicare and [employer]    Authorization Time Period 06/22/17 to 07/23/17   Authorization - Visit Number 6   Authorization - Number of Visits 10   PT Start Time 5852   PT Stop Time 1426   PT Time Calculation (min) 39 min   Equipment Utilized During Treatment Gait belt   Activity Tolerance Patient tolerated treatment well;Patient limited by pain   Behavior During Therapy [facility] for tasks assessed/performed      Past Medical History:  Diagnosis Date  . Arthritis   . Balance problem    uses a walker  . Blood in urine    Sees urology yearly  . Deafness in right ear age 69  . Hyperlipidemia   . Hypertension   . Temporal arteritis ([place]) 2016  . Urinary incontinence     Past Surgical History:  Procedure Laterality Date  . BACK SURGERY  2010  . CATARACT EXTRACTION Bilateral 07/30/14, 08/13/2014  . CERVICAL SPINE SURGERY  09/01/15   C 4-5 , [place]  . COLONOSCOPY  12/19/2007   RMR: 1. External hemorrohids, otherwise normal rectum.2. Normal colon. 3. Normal terminal ileum.  . COLONOSCOPY N/A 12/13/2014   Procedure: COLONOSCOPY;  Surgeon: Daneil Dolin, MD;  [facility];  Service: Endoscopy;  Laterality: N/A;  11:15 Pt Request Time  . KNEE SURGERY Left    around 2000, arthroscopic  . TOTAL HIP ARTHROPLASTY Right 02/23/2017   Procedure: TOTAL HIP ARTHROPLASTY ANTERIOR APPROACH;  Surgeon: Hessie Knows, MD;  Location: [facility] ORS;  Service: Orthopedics;  Laterality: Right;    There were no vitals filed for this  visit.      Subjective Assessment - 07/12/17 1348    Subjective Patient arrives stating he is doing well, his L knee continues to hurt    Pertinent History HTN, BPH, hx lumbar and cervical stenosis, macular degeneration, has used walker for 2 years, history of cervical/lumbar/L knee surgeries, R anterior hip 2018.   Patient Stated Goals improve balance and strength    Currently in Pain? No/denies  0/10 L knee when sitting, 3/10 L knee when standing                          [facility] Adult PT Treatment/Exercise - 07/12/17 0001      Knee/Hip Exercises: Standing   Heel Raises Both;1 set;20 reps   Heel Raises Limitations heel and toe    Forward Lunges Both;1 set;15 reps   Forward Lunges Limitations 4 inch box B fingers    Functional Squat 10 reps  reps limited by L knee pain    Functional Squat Limitations butt back, knees bent    Other Standing Knee Exercises sanding marches 1x20 B fingers; large and small hurdles forward and lateral    Other Standing Knee Exercises hip hikes 2x10 B with and without swing      Knee/Hip Exercises: Seated   Long Arc Quad Both;1 set;15 reps   Long [employer]  Weight 5 lbs.   Long [employer] Limitations 3 second holds    Clamshell with [employer]  1x20   Knee/Hip Flexion 1x20 green TB upright trunk              Balance Exercises - 07/12/17 1428      Balance Exercises: Standing   Standing Eyes Closed Narrow base of support (BOS);Foam/compliant surface  external perturbations    Retro Gait 3 reps  in // basr    Other Standing Exercises standing cone taps (longitudinal row instead of linear) x3            PT Education - 07/12/17 1430    Education provided Yes   Education Details likley re-assess next week    Person(s) Educated Patient   Methods Explanation   Comprehension Verbalized understanding          PT Short Term Goals - 06/22/17 1533      PT SHORT TERM GOAL #1   Title Pt will demo consistency and  independence with his HEP to improve strength and mobility.    Time 2   Period Weeks   Status New   Target Date 07/06/17     PT SHORT TERM GOAL #2   Title Pt will demo resolutation of hamstring length/flexibility impairments B in order to improve posture and gait    Time 2   Period Weeks   Status New     PT SHORT TERM GOAL #3   Title Patient to be able to perform sit to stand without UEs and without multiple attempts and no unsteadiness upon reaching standing in order to demonstrate improved functional mobility    Time 2   Period Weeks   Status New           PT Long Term Goals - 06/22/17 1535      PT LONG TERM GOAL #1   Title Pt will demo strength as having improved by at least 1 MMT grade in all tested groups in order to improve balance and functional activity    Time 4   Period Weeks   Status New   Target Date 07/20/17     PT LONG TERM GOAL #2   Title Pt will perform 5x sit to stand without UE support in less than 12 sec, to improve his safety/independence getting in and out of chairs at home.    Time 4   Period Weeks   Status New     PT LONG TERM GOAL #3   Title Pt will perform TUG in less than 12 sec, with LRAD to indicate he is at a decreased risk of falling in the community.    Time 4   Period Weeks   Status New     PT LONG TERM GOAL #4   Title Pt will perform SLS on each LE up to 10 sec without LOB, 3/5 trials, to decrease his risk of falling during ambulation/stair negotiation.    Time 4   Period Weeks   Status New               Plan - 07/12/17 1432    Clinical Impression Statement Continued with functional strength and balance as indicated per PT POC and as tolerated with pain L knee. Patient remains in very good spirits and motivated to participate in skilled PT services at this point. Recommend re-assessment within next 2-3 sessions to assess progress moving forward.    Rehab Potential Good   Clinical Impairments  Affecting Rehab Potential (+)  motivated to participate, moderate success with last course of PT; (-) chronicity of impairment, recurrence of impairment    PT Frequency 2x / week   PT Duration 4 weeks   PT Treatment/Interventions ADLs/Self Care Home Management;Cryotherapy;DME Instruction;Gait training;Stair training;Functional mobility training;Therapeutic activities;Therapeutic exercise;Balance training;Neuromuscular re-education;Patient/family education;Electrical Stimulation;Iontophoresis 4mg /ml Dexamethasone;Moist Heat;Orthotic Fit/Training;Manual techniques   PT Next Visit Plan cotninue hip strength, single leg dfunctional activity, and focal balance. Re-assess within next 2-3 sessions. Caution with SLS and CKC activities due to knee pain.    PT Home Exercise Plan Eval: continue HHPT HEP and old HEP from this clinic; add heel raises, standing HS curls, eccentric stand to sit, balance with one foot forward    Consulted and Agree with Plan of Care Patient      Patient will benefit from skilled therapeutic intervention in order to improve the following deficits and impairments:  Abnormal gait, Decreased coordination, Decreased mobility, Postural dysfunction, Decreased strength, Decreased balance, Difficulty walking, Decreased safety awareness, Impaired flexibility  Visit Diagnosis: Unsteadiness on feet  Muscle weakness (generalized)  Other abnormalities of gait and mobility     Problem List Patient Active Problem List   Diagnosis Date Noted  . Avascular necrosis of bone of right hip ([place]) 02/23/2017  . Primary osteoarthritis involving multiple joints 02/21/2017  . Smoker 02/21/2017  . Macular degeneration 11/10/2016  . Insomnia 07/20/2016  . Restless legs 07/20/2016  . Spinal stenosis in cervical region 10/14/2015  . Senile purpura ([place]) 04/18/2015  . Osteopenia 01/28/2015  . Spinal stenosis of lumbar region 01/20/2015  . BPH (benign prostatic hyperplasia) 01/20/2015  . Diverticulosis of colon without  hemorrhage   . Encounter for screening colonoscopy 11/21/2014  . HTN (hypertension), benign 04/17/2014  . Temporal arteritis ([place]) 04/17/2014    Deniece Ree PT, DPT [phone]  [facility] [address] [place], [place], [address] Phone: [phone]   Fax:  [phone]  Name: Gerald Hall MRN: [id number] Date of Birth: 30-Jul-1935

## 2017-07-13 DIAGNOSIS — Z96641 Presence of right artificial hip joint: Secondary | ICD-10-CM | POA: Diagnosis not present

## 2017-07-13 DIAGNOSIS — M25562 Pain in left knee: Secondary | ICD-10-CM | POA: Diagnosis not present

## 2017-07-13 DIAGNOSIS — M1712 Unilateral primary osteoarthritis, left knee: Secondary | ICD-10-CM | POA: Diagnosis not present

## 2017-07-15 ENCOUNTER — Ambulatory Visit (HOSPITAL_COMMUNITY): Payer: Medicare Other

## 2017-07-15 ENCOUNTER — Other Ambulatory Visit: Payer: Self-pay | Admitting: Family Medicine

## 2017-07-15 DIAGNOSIS — M6281 Muscle weakness (generalized): Secondary | ICD-10-CM | POA: Diagnosis not present

## 2017-07-15 DIAGNOSIS — R2689 Other abnormalities of gait and mobility: Secondary | ICD-10-CM

## 2017-07-15 DIAGNOSIS — R2681 Unsteadiness on feet: Secondary | ICD-10-CM | POA: Diagnosis not present

## 2017-07-15 NOTE — Therapy (Signed)
[place], [place], [address] Phone: [phone]   Fax:  [phone]  Physical Therapy Treatment  Patient Details  Name: Gerald Hall MRN: [id number] Date of Birth: 01-Jan-1935 Referring Provider: Sallee Lange  Encounter Date: 07/15/2017      PT End of Session - 07/15/17 1317    Visit Number 7   Number of Visits 9   Date for PT Re-Evaluation 07/20/17   Authorization Type Medicare and [employer]    Authorization Time Period 06/22/17 to 07/23/17   Authorization - Visit Number 7   Authorization - Number of Visits 10   PT Start Time 1302   PT Stop Time 1342   PT Time Calculation (min) 40 min   Activity Tolerance Patient tolerated treatment well;Patient limited by pain   Behavior During Therapy [facility] for tasks assessed/performed      Past Medical History:  Diagnosis Date  . Arthritis   . Balance problem    uses a walker  . Blood in urine    Sees urology yearly  . Deafness in right ear age 82  . Hyperlipidemia   . Hypertension   . Temporal arteritis ([place]) 2016  . Urinary incontinence     Past Surgical History:  Procedure Laterality Date  . BACK SURGERY  2010  . CATARACT EXTRACTION Bilateral 07/30/14, 08/13/2014  . CERVICAL SPINE SURGERY  09/01/15   C 4-5 , [place]  . COLONOSCOPY  12/19/2007   RMR: 1. External hemorrohids, otherwise normal rectum.2. Normal colon. 3. Normal terminal ileum.  . COLONOSCOPY N/A 12/13/2014   Procedure: COLONOSCOPY;  Surgeon: Daneil Dolin, MD;  [facility];  Service: Endoscopy;  Laterality: N/A;  11:15 Pt Request Time  . KNEE SURGERY Left    around 2000, arthroscopic  . TOTAL HIP ARTHROPLASTY Right 02/23/2017   Procedure: TOTAL HIP ARTHROPLASTY ANTERIOR APPROACH;  Surgeon: Hessie Knows, MD;  Location: [facility] OR[facility];  Service: Orthopedics;  Laterality: Right;    There were no vitals filed for this visit.      Subjective Assessment - 07/15/17 1311     Subjective Pt doping well. No updates to day. HEP still going well. Had a cortisone injection on Wednesday on the Left knee and it is feeling better.    Pertinent History HTN, BPH, hx lumbar and cervical stenosis, macular degeneration, has used walker for 2 years, history of cervical/lumbar/L knee surgeries, R anterior hip 2018.   Currently in Pain? No/denies                         [facility] Adult PT Treatment/Exercise - 07/15/17 0001      Ambulation/Gait   Ambulation Distance (Feet) 675 Feet  3x225: 84s, 100s,    Gait velocity 0.64m/s   Gait Comments flat foot strike, Rt lateral collapse, in stance, Rt scissoring   performed with minA, s rollator      Knee/Hip Exercises: Stretches   Hip Flexor Stretch 3 reps;30 seconds;Both  Atsushi test stretch with physical assist   Other Knee/Hip Stretches DKTC  2x30s; DKTC repetition: 2x25   Other Knee/Hip Stretches Trunk Rotation Stretch P/ROM: 2x30sec bilat     Knee/Hip Exercises: Standing   Heel Raises Both;20 reps;2 sets   Heel Raises Limitations standing dosiflexion on 2" step: 2x10 bilat   Other Standing Knee Exercises narrow stance on foam 3x30sec     Knee/Hip Exercises: Seated   Long Arc  Quad Both;2 sets;10 reps   Long [employer] Weight 7 lbs.   Sit to Sand 10 reps;without UE support;2 sets  VC for full upright, chair + airex foam                  PT Short Term Goals - 06/22/17 1533      PT SHORT TERM GOAL #1   Title Pt will demo consistency and independence with his HEP to improve strength and mobility.    Time 2   Period Weeks   Status New   Target Date 07/06/17     PT SHORT TERM GOAL #2   Title Pt will demo resolutation of hamstring length/flexibility impairments B in order to improve posture and gait    Time 2   Period Weeks   Status New     PT SHORT TERM GOAL #3   Title Patient to be able to perform sit to stand without UEs and without multiple attempts and no unsteadiness upon reaching  standing in order to demonstrate improved functional mobility    Time 2   Period Weeks   Status New           PT Long Term Goals - 06/22/17 1535      PT LONG TERM GOAL #1   Title Pt will demo strength as having improved by at least 1 MMT grade in all tested groups in order to improve balance and functional activity    Time 4   Period Weeks   Status New   Target Date 07/20/17     PT LONG TERM GOAL #2   Title Pt will perform 5x sit to stand without UE support in less than 12 sec, to improve his safety/independence getting in and out of chairs at home.    Time 4   Period Weeks   Status New     PT LONG TERM GOAL #3   Title Pt will perform TUG in less than 12 sec, with LRAD to indicate he is at a decreased risk of falling in the community.    Time 4   Period Weeks   Status New     PT LONG TERM GOAL #4   Title Pt will perform SLS on each LE up to 10 sec without LOB, 3/5 trials, to decrease his risk of falling during ambulation/stair negotiation.    Time 4   Period Weeks   Status New               Plan - 07/15/17 1319    Clinical Impression Statement Session focus on spine and hip mobility, LE strengthening, dynamic balance, and functional activity tolerance. No pain this session, noted weakenss on left quads compared to right, but feels better since injection.    Rehab Potential Good   Clinical Impairments Affecting Rehab Potential (+) motivated to participate, moderate success with last course of PT; (-) chronicity of impairment, recurrence of impairment    PT Frequency 2x / week   PT Duration 4 weeks   PT Treatment/Interventions ADLs/Self Care Home Management;Cryotherapy;DME Instruction;Gait training;Stair training;Functional mobility training;Therapeutic activities;Therapeutic exercise;Balance training;Neuromuscular re-education;Patient/family education;Electrical Stimulation;Iontophoresis 4mg /ml Dexamethasone;Moist Heat;Orthotic Fit/Training;Manual techniques   PT  Next Visit Plan cotninue hip strength/mobility, lumbar spine mobility with flexion preference, single leg functional activity,     PT Home Exercise Plan Eval: continue HHPT HEP and old HEP from this clinic; add heel raises, standing HS curls, eccentric stand to sit, balance with one foot forward    Consulted  and Agree with Plan of Care Patient      Patient will benefit from skilled therapeutic intervention in order to improve the following deficits and impairments:  Abnormal gait, Decreased coordination, Decreased mobility, Postural dysfunction, Decreased strength, Decreased balance, Difficulty walking, Decreased safety awareness, Impaired flexibility  Visit Diagnosis: Unsteadiness on feet  Muscle weakness (generalized)  Other abnormalities of gait and mobility     Problem List Patient Active Problem List   Diagnosis Date Noted  . Avascular necrosis of bone of right hip ([place]) 02/23/2017  . Primary osteoarthritis involving multiple joints 02/21/2017  . Smoker 02/21/2017  . Macular degeneration 11/10/2016  . Insomnia 07/20/2016  . Restless legs 07/20/2016  . Spinal stenosis in cervical region 10/14/2015  . Senile purpura ([place]) 04/18/2015  . Osteopenia 01/28/2015  . Spinal stenosis of lumbar region 01/20/2015  . BPH (benign prostatic hyperplasia) 01/20/2015  . Diverticulosis of colon without hemorrhage   . Encounter for screening colonoscopy 11/21/2014  . HTN (hypertension), benign 04/17/2014  . Temporal arteritis ([place]) 04/17/2014    1:43 PM, 07/15/17 Etta Grandchild, PT, DPT Physical Therapist - [place] [phone] (Office)    Etta Grandchild 07/15/2017, 1:43 PM  [facility] [name] [place], [place], [address] Phone: [phone]   Fax:  [phone]  Name: SEELEY HISSONG MRN: [id number] Date of Birth: Dec 15, 1934

## 2017-07-19 ENCOUNTER — Ambulatory Visit (HOSPITAL_COMMUNITY): Payer: Medicare Other

## 2017-07-19 DIAGNOSIS — R2689 Other abnormalities of gait and mobility: Secondary | ICD-10-CM | POA: Diagnosis not present

## 2017-07-19 DIAGNOSIS — R2681 Unsteadiness on feet: Secondary | ICD-10-CM | POA: Diagnosis not present

## 2017-07-19 DIAGNOSIS — M6281 Muscle weakness (generalized): Secondary | ICD-10-CM | POA: Diagnosis not present

## 2017-07-19 NOTE — Therapy (Signed)
[place], [place], [address] Phone: [phone]   Fax:  [phone]  Physical Therapy Treatment  Patient Details  Name: Gerald Hall MRN: [id number] Date of Birth: 12/07/1934 Referring Provider: Sallee Lange  Encounter Date: 07/19/2017      PT End of Session - 07/19/17 1312    Visit Number 8   Number of Visits 9   Date for PT Re-Evaluation 07/20/17   Authorization Type Medicare and [employer]    Authorization Time Period 06/22/17 to 07/23/17   Authorization - Visit Number 8   Authorization - Number of Visits 10   PT Start Time 3419   PT Stop Time 3790  Nustep at EOS x 40min (not included with billing)   PT Time Calculation (min) 49 min   Equipment Utilized During Treatment Gait belt   Activity Tolerance Patient tolerated treatment well;Patient limited by pain   Behavior During Therapy [facility] for tasks assessed/performed      Past Medical History:  Diagnosis Date  . Arthritis   . Balance problem    uses a walker  . Blood in urine    Sees urology yearly  . Deafness in right ear age 43  . Hyperlipidemia   . Hypertension   . Temporal arteritis ([place]) 2016  . Urinary incontinence     Past Surgical History:  Procedure Laterality Date  . BACK SURGERY  2010  . CATARACT EXTRACTION Bilateral 07/30/14, 08/13/2014  . CERVICAL SPINE SURGERY  09/01/15   C 4-5 , [place]  . COLONOSCOPY  12/19/2007   RMR: 1. External hemorrohids, otherwise normal rectum.2. Normal colon. 3. Normal terminal ileum.  . COLONOSCOPY N/A 12/13/2014   Procedure: COLONOSCOPY;  Surgeon: Daneil Dolin, MD;  [facility];  Service: Endoscopy;  Laterality: N/A;  11:15 Pt Request Time  . KNEE SURGERY Left    around 2000, arthroscopic  . TOTAL HIP ARTHROPLASTY Right 02/23/2017   Procedure: TOTAL HIP ARTHROPLASTY ANTERIOR APPROACH;  Surgeon: Hessie Knows, MD;  Location: [facility] OR[facility];  Service: Orthopedics;  Laterality:  Right;    There were no vitals filed for this visit.      Subjective Assessment - 07/19/17 1311    Subjective No reports of pain today,  reports compliance with HEP.  Stated most difficulty continues with balance.  No reports of recent falls.   Pertinent History HTN, BPH, hx lumbar and cervical stenosis, macular degeneration, has used walker for 2 years, history of cervical/lumbar/L knee surgeries, R anterior hip 2018.   Patient Stated Goals improve balance and strength    Currently in Pain? No/denies                         [facility] Adult PT Treatment/Exercise - 07/19/17 0001      Knee/Hip Exercises: Stretches   Other Knee/Hip Stretches DKTC 2x 25 repetition     Knee/Hip Exercises: Aerobic   Nustep 8' L3 no charge at EOS     Knee/Hip Exercises: Standing   Heel Raises Both;20 reps;2 sets   Heel Raises Limitations standing incline slope with 2 finger assistance 2x 20   Functional Squat 15 reps   Functional Squat Limitations cueing for mechanics and HHA   SLS 5 attempts Rt 8", Lt 11"   SLS with Vectors cone tapping    Other Standing Knee Exercises hip hikes 2x10 B with and without swing  Knee/Hip Exercises: Seated   Long Arc Quad 15 reps   Long Arc Quad Weight 7 lbs.   Long [employer] Limitations 3 second holds sitting on dynadisc no HHA   Marching Limitations 2 sets 15x 5" holds sitting on dynadisc no HHA   Sit to [employer] 10 reps;without UE support;2 sets  average chair height no HHA             Balance Exercises - 07/19/17 1342      Balance Exercises: Standing   SLS 5 reps   Step Over Hurdles / Cones 6 and 12in 2 RT   Other Standing Exercises standing cone tapping 2 setsx 10 reps each LE             PT Short Term Goals - 06/22/17 1533      PT SHORT TERM GOAL #1   Title Pt will demo consistency and independence with his HEP to improve strength and mobility.    Time 2   Period Weeks   Status New   Target Date 07/06/17     PT SHORT TERM  GOAL #2   Title Pt will demo resolutation of hamstring length/flexibility impairments B in order to improve posture and gait    Time 2   Period Weeks   Status New     PT SHORT TERM GOAL #3   Title Patient to be able to perform sit to stand without UEs and without multiple attempts and no unsteadiness upon reaching standing in order to demonstrate improved functional mobility    Time 2   Period Weeks   Status New           PT Long Term Goals - 06/22/17 1535      PT LONG TERM GOAL #1   Title Pt will demo strength as having improved by at least 1 MMT grade in all tested groups in order to improve balance and functional activity    Time 4   Period Weeks   Status New   Target Date 07/20/17     PT LONG TERM GOAL #2   Title Pt will perform 5x sit to stand without UE support in less than 12 sec, to improve his safety/independence getting in and out of chairs at home.    Time 4   Period Weeks   Status New     PT LONG TERM GOAL #3   Title Pt will perform TUG in less than 12 sec, with LRAD to indicate he is at a decreased risk of falling in the community.    Time 4   Period Weeks   Status New     PT LONG TERM GOAL #4   Title Pt will perform SLS on each LE up to 10 sec without LOB, 3/5 trials, to decrease his risk of falling during ambulation/stair negotiation.    Time 4   Period Weeks   Status New               Plan - 07/19/17 1653    Clinical Impression Statement Continued session focus on strengthening and balance activities with increased focus on SLS activities.  Pt presents with improved functional abilities per ability to complete STS no HHA from lower height.  Continues to require min A and 1 finger assistance with SLS activities.  PT limited by fatigue at EOS.     Rehab Potential Good   Clinical Impairments Affecting Rehab Potential (+) motivated to participate, moderate success with last course of PT; (-)  chronicity of impairment, recurrence of impairment    PT  Frequency 2x / week   PT Duration 4 weeks   PT Treatment/Interventions ADLs/Self Care Home Management;Cryotherapy;DME Instruction;Gait training;Stair training;Functional mobility training;Therapeutic activities;Therapeutic exercise;Balance training;Neuromuscular re-education;Patient/family education;Electrical Stimulation;Iontophoresis 4mg /ml Dexamethasone;Moist Heat;Orthotic Fit/Training;Manual techniques   PT Next Visit Plan reassess next session.     PT Home Exercise Plan Eval: continue HHPT HEP and old HEP from this clinic; add heel raises, standing HS curls, eccentric stand to sit, balance with one foot forward       Patient will benefit from skilled therapeutic intervention in order to improve the following deficits and impairments:  Abnormal gait, Decreased coordination, Decreased mobility, Postural dysfunction, Decreased strength, Decreased balance, Difficulty walking, Decreased safety awareness, Impaired flexibility  Visit Diagnosis: Unsteadiness on feet  Muscle weakness (generalized)  Other abnormalities of gait and mobility     Problem List Patient Active Problem List   Diagnosis Date Noted  . Avascular necrosis of bone of right hip ([place]) 02/23/2017  . Primary osteoarthritis involving multiple joints 02/21/2017  . Smoker 02/21/2017  . Macular degeneration 11/10/2016  . Insomnia 07/20/2016  . Restless legs 07/20/2016  . Spinal stenosis in cervical region 10/14/2015  . Senile purpura ([place]) 04/18/2015  . Osteopenia 01/28/2015  . Spinal stenosis of lumbar region 01/20/2015  . BPH (benign prostatic hyperplasia) 01/20/2015  . Diverticulosis of colon without hemorrhage   . Encounter for screening colonoscopy 11/21/2014  . HTN (hypertension), benign 04/17/2014  . Temporal arteritis ([place]) 04/17/2014   Ihor Austin, [place]; [place]  Aldona Lento 07/19/2017, 4:59 PM  [facility] [address] [place],  [place], [address] Phone: [phone]   Fax:  [phone]  Name: Gerald Hall MRN: [id number] Date of Birth: 15-Nov-1934

## 2017-07-21 ENCOUNTER — Ambulatory Visit (HOSPITAL_COMMUNITY): Payer: Medicare Other

## 2017-07-21 ENCOUNTER — Encounter (HOSPITAL_COMMUNITY): Payer: Self-pay

## 2017-07-21 DIAGNOSIS — R2689 Other abnormalities of gait and mobility: Secondary | ICD-10-CM

## 2017-07-21 DIAGNOSIS — M6281 Muscle weakness (generalized): Secondary | ICD-10-CM

## 2017-07-21 DIAGNOSIS — R2681 Unsteadiness on feet: Secondary | ICD-10-CM

## 2017-07-21 NOTE — Therapy (Signed)
[facility] [address] [place], [place], [address] Phone: [phone]   Fax:  [phone]  Physical Therapy Treatment / ReEvaluation  Patient Details  Name: Gerald Hall MRN: [id number] Date of Birth: 06-21-1935 Referring Provider: Sallee Lange  Encounter Date: 07/21/2017      PT End of Session - 07/21/17 1520    Visit Number 9   Number of Visits 9   Date for PT Re-Evaluation 08/20/17   Authorization Type Medicare and [employer]    Authorization Time Period 06/22/17 to 07/23/17   Authorization - Visit Number 9   Authorization - Number of Visits 10   PT Start Time 2778   PT Stop Time 1430   PT Time Calculation (min) 45 min   Equipment Utilized During Treatment Gait belt   Activity Tolerance Patient tolerated treatment well;Patient limited by pain   Behavior During Therapy [facility] for tasks assessed/performed      Past Medical History:  Diagnosis Date  . Arthritis   . Balance problem    uses a walker  . Blood in urine    Sees urology yearly  . Deafness in right ear age 9  . Hyperlipidemia   . Hypertension   . Temporal arteritis ([place]) 2016  . Urinary incontinence     Past Surgical History:  Procedure Laterality Date  . BACK SURGERY  2010  . CATARACT EXTRACTION Bilateral 07/30/14, 08/13/2014  . CERVICAL SPINE SURGERY  09/01/15   C 4-5 , [place]  . COLONOSCOPY  12/19/2007   RMR: 1. External hemorrohids, otherwise normal rectum.2. Normal colon. 3. Normal terminal ileum.  . COLONOSCOPY N/A 12/13/2014   Procedure: COLONOSCOPY;  Surgeon: Daneil Dolin, MD;  [facility];  Service: Endoscopy;  Laterality: N/A;  11:15 Pt Request Time  . KNEE SURGERY Left    around 2000, arthroscopic  . TOTAL HIP ARTHROPLASTY Right 02/23/2017   Procedure: TOTAL HIP ARTHROPLASTY ANTERIOR APPROACH;  Surgeon: Hessie Knows, MD;  Location: [facility] ORS;  Service: Orthopedics;  Laterality: Right;    There were no vitals filed  for this visit.      Subjective Assessment - 07/21/17 1349    Subjective Patient notes that he does not have any reports of pain today. He notes that he has not had any falls or close calls.    Currently in Pain? No/denies            [facility] PT Assessment - 07/21/17 0001      Strength   Right Hip Flexion 5/5   Left Hip Flexion 5/5   Right Knee Flexion 5/5   Right Knee Extension 5/5   Left Knee Flexion 5/5   Left Knee Extension 4+/5     Flexibility   Hamstrings mild limition hamstring R and Lt.      Ambulation/Gait   Ambulation Distance (Feet) 638 Feet   Assistive device Rolling walker                     [facility] Adult PT Treatment/Exercise - 07/21/17 0001      Knee/Hip Exercises: Aerobic   Nustep 8' L3 no charge at EOS     Knee/Hip Exercises: Standing   Heel Raises Both;20 reps;2 sets   SLS 4 attempts Rt, Lt. 1 UE assist 10 sec.    Other Standing Knee Exercises tandem on foam reaching cone transfer across midline 5 reps x 2 each UE.  Knee/Hip Exercises: Seated   Sit to Sand 5 reps;without UE support                  PT Short Term Goals - 07/21/17 1351      PT SHORT TERM GOAL #1   Title Pt will demo consistency and independence with his HEP to improve strength and mobility.    Time 2   Period Weeks   Status On-going     PT SHORT TERM GOAL #2   Title Pt will demo resolutation of hamstring length/flexibility impairments B in order to improve posture and gait    Time 2   Period Weeks   Status New     PT SHORT TERM GOAL #3   Title Patient to be able to perform sit to stand without UEs and without multiple attempts and no unsteadiness upon reaching standing in order to demonstrate improved functional mobility    Baseline Able to and notes he doesn't have any trouble, however, required momentum this session 1 trial.    Time 2   Period Weeks   Status Partially Met           PT Long Term Goals - 07/21/17 1350      PT LONG TERM  GOAL #1   Title Pt will demo strength as having improved by at least 1 MMT grade in all tested groups in order to improve balance and functional activity    Time 4   Period Weeks   Status Achieved     PT LONG TERM GOAL #2   Title Pt will perform 5x sit to stand without UE support in less than 12 sec, to improve his safety/independence getting in and out of chairs at home.    Baseline 12.73 seconds   Time 4   Period Weeks   Status On-going     PT LONG TERM GOAL #3   Title Pt will perform TUG in less than 12 sec, with LRAD to indicate he is at a decreased risk of falling in the community.    Baseline 12.08 sec using rollator   Time 4   Period Weeks   Status Partially Met     PT LONG TERM GOAL #4   Title Pt will perform SLS on each LE up to 10 sec without LOB, 3/5 trials, to decrease his risk of falling during ambulation/stair negotiation.    Baseline Single leg stance 1-2 seconds bilaterally    Time 4   Period Weeks   Status On-going               Plan - 07/21/17 1522    Clinical Impression Statement Reassessed patient this session regarding progress towards functional goals. He has improved in strength and gait speed, however, balance continues to be a challenge without UE support (max 2 seconds bilaterally). Patient agreed upon continued visits to improve balance to decrease risk of falls.    PT Treatment/Interventions ADLs/Self Care Home Management;Cryotherapy;DME Instruction;Gait training;Stair training;Functional mobility training;Therapeutic activities;Therapeutic exercise;Balance training;Neuromuscular re-education;Patient/family education;Electrical Stimulation;Iontophoresis 31m/ml Dexamethasone;Moist Heat;Orthotic Fit/Training;Manual techniques   PT Next Visit Plan Dynamic balance and static balance to improve safety during ambulation and stairs      Patient will benefit from skilled therapeutic intervention in order to improve the following deficits and impairments:   Abnormal gait, Decreased coordination, Decreased mobility, Postural dysfunction, Decreased strength, Decreased balance, Difficulty walking, Decreased safety awareness, Impaired flexibility  Visit Diagnosis: Unsteadiness on feet  Muscle weakness (generalized)  Other abnormalities of  gait and mobility     Problem List Patient Active Problem List   Diagnosis Date Noted  . Avascular necrosis of bone of right hip ([place]) 02/23/2017  . Primary osteoarthritis involving multiple joints 02/21/2017  . Smoker 02/21/2017  . Macular degeneration 11/10/2016  . Insomnia 07/20/2016  . Restless legs 07/20/2016  . Spinal stenosis in cervical region 10/14/2015  . Senile purpura ([place]) 04/18/2015  . Osteopenia 01/28/2015  . Spinal stenosis of lumbar region 01/20/2015  . BPH (benign prostatic hyperplasia) 01/20/2015  . Diverticulosis of colon without hemorrhage   . Encounter for screening colonoscopy 11/21/2014  . HTN (hypertension), benign 04/17/2014  . Temporal arteritis ([place]) 04/17/2014   Starr Lake PT, DPT 3:28 PM, 07/21/17 [phone]  Starr Lake 07/21/2017, 3:27 PM  [facility] [address] [place], [place], [address] Phone: [phone]   Fax:  [phone]  Name: ONOFRIO KLEMP MRN: [id number] Date of Birth: 1935/07/18

## 2017-07-26 ENCOUNTER — Encounter (HOSPITAL_COMMUNITY): Payer: Self-pay

## 2017-07-26 ENCOUNTER — Ambulatory Visit (HOSPITAL_COMMUNITY): Payer: Medicare Other | Attending: Family Medicine

## 2017-07-26 DIAGNOSIS — R2689 Other abnormalities of gait and mobility: Secondary | ICD-10-CM | POA: Insufficient documentation

## 2017-07-26 DIAGNOSIS — R2681 Unsteadiness on feet: Secondary | ICD-10-CM | POA: Insufficient documentation

## 2017-07-26 DIAGNOSIS — M6281 Muscle weakness (generalized): Secondary | ICD-10-CM

## 2017-07-26 NOTE — Therapy (Addendum)
[facility], [place], [address] Phone: [phone]   Fax:  [phone]  Physical Therapy Treatment  Patient Details  Name: Gerald Hall MRN: [id number] Date of Birth: Dec 17, 1934 Referring Provider: Sallee Lange  Encounter Date: 07/26/2017      PT End of Session - 07/26/17 0956    Visit Number 10   Number of Visits 18   Date for PT Re-Evaluation 08/20/17   Authorization Type Medicare and [employer]; GCode complete 10th session on 07/26/2017   Authorization Time Period 06/22/17 to 07/23/17   Authorization - Visit Number 10   Authorization - Number of Visits 20   PT Start Time 0932   PT Stop Time 1038  last 8 min on Nustep- not included with charges   PT Time Calculation (min) 47 min   Equipment Utilized During Treatment Gait belt   Activity Tolerance Patient tolerated treatment well;Patient limited by fatigue   Behavior During Therapy [facility] for tasks assessed/performed      Past Medical History:  Diagnosis Date  . Arthritis   . Balance problem    uses a walker  . Blood in urine    Sees urology yearly  . Deafness in right ear age 19  . Hyperlipidemia   . Hypertension   . Temporal arteritis ([place]) 2016  . Urinary incontinence     Past Surgical History:  Procedure Laterality Date  . BACK SURGERY  2010  . CATARACT EXTRACTION Bilateral 07/30/14, 08/13/2014  . CERVICAL SPINE SURGERY  09/01/15   C 4-5 , [place]  . COLONOSCOPY  12/19/2007   RMR: 1. External hemorrohids, otherwise normal rectum.2. Normal colon. 3. Normal terminal ileum.  . COLONOSCOPY N/A 12/13/2014   Procedure: COLONOSCOPY;  Surgeon: Daneil Dolin, MD;  [facility];  Service: Endoscopy;  Laterality: N/A;  11:15 Pt Request Time  . KNEE SURGERY Left    around 2000, arthroscopic  . TOTAL HIP ARTHROPLASTY Right 02/23/2017   Procedure: TOTAL HIP ARTHROPLASTY ANTERIOR APPROACH;  Surgeon: Hessie Knows, MD;  Location:  [facility] ORS;  Service: Orthopedics;  Laterality: Right;    There were no vitals filed for this visit.      Subjective Assessment - 07/26/17 0956    Subjective Pt stated she feels good today, no reports of pain or recent falls.  Reports most difficulty with balance currently,   Pertinent History HTN, BPH, hx lumbar and cervical stenosis, macular degeneration, has used walker for 2 years, history of cervical/lumbar/L knee surgeries, R anterior hip 2018.   Patient Stated Goals improve balance and strength    Currently in Pain? No/denies            [facility] PT Assessment - 07/26/17 0001      Standardized Balance Assessment   Standardized Balance Assessment Timed Up and Go Test;Five Times Sit to Stand   Five times sit to stand comments  2 sets 1st 12.86" 2nd 11.93" cueing for eccentric control     Timed Up and Go Test   TUG Normal TUG   Normal TUG (seconds) 10.92  with rollator                     [facility] Adult PT Treatment/Exercise - 07/26/17 0001      Knee/Hip Exercises: Aerobic   Nustep 8' L3 no charge at [facility]     Knee/Hip Exercises: Seated   Sit to Sand 5 reps;without UE support  2 sets 1st 12.86" 2nd 11.93" cueing for eccentric control             Balance Exercises - 07/26/17 1021      Balance Exercises: Standing   Standing Eyes Opened Narrow base of support (BOS);Other reps (comment)  Palvo with RTB and NBOS both directions 10 reps   Tandem Stance Eyes open;Foam/compliant surface;3 reps;30 secs   SLS 5 reps   SLS with Vectors --  begin next session   Tandem Gait 1 rep   Sidestepping 2 reps;Theraband  2RT   Step Over Hurdles / Cones 6 and 12in 2 RT forward and sidestep   Marching Limitations 10x alternating with intermittent HHA             PT Short Term Goals - 07/21/17 1351      PT SHORT TERM GOAL #1   Title Pt will demo consistency and independence with his HEP to improve strength and mobility.    Time 2   Period Weeks   Status On-going      PT SHORT TERM GOAL #2   Title Pt will demo resolutation of hamstring length/flexibility impairments B in order to improve posture and gait    Time 2   Period Weeks   Status New     PT SHORT TERM GOAL #3   Title Patient to be able to perform sit to stand without UEs and without multiple attempts and no unsteadiness upon reaching standing in order to demonstrate improved functional mobility    Baseline Able to and notes he doesn't have any trouble, however, required momentum this session 1 trial.    Time 2   Period Weeks   Status Partially Met           PT Long Term Goals - 07/21/17 1350      PT LONG TERM GOAL #1   Title Pt will demo strength as having improved by at least 1 MMT grade in all tested groups in order to improve balance and functional activity    Time 4   Period Weeks   Status Achieved     PT LONG TERM GOAL #2   Title Pt will perform 5x sit to stand without UE support in less than 12 sec, to improve his safety/independence getting in and out of chairs at home.    Baseline 12.73 seconds   Time 4   Period Weeks   Status On-going     PT LONG TERM GOAL #3   Title Pt will perform TUG in less than 12 sec, with LRAD to indicate he is at a decreased risk of falling in the community.    Baseline 12.08 sec using rollator   Time 4   Period Weeks   Status Partially Met     PT LONG TERM GOAL #4   Title Pt will perform SLS on each LE up to 10 sec without LOB, 3/5 trials, to decrease his risk of falling during ambulation/stair negotiation.    Baseline Single leg stance 1-2 seconds bilaterally    Time 4   Period Weeks   Status On-going               Plan - 07/26/17 1312    Clinical Impression Statement Session focus with balance training primarly.  Pt progressing well wiht functional strengthening noted wiht ability to complete 5 STS in 11.93" and TUG at 10.92" with RW.  Increased focus with single leg stance activities with min A for safety this  sessino.   Improved confidence noted wiht hurles this session though did require intermittent HHA.  No reoprts of pain, was limited by fatigue with tasks.     Rehab Potential Good   Clinical Impairments Affecting Rehab Potential (+) motivated to participate, moderate success with last course of PT; (-) chronicity of impairment, recurrence of impairment    PT Frequency 2x / week   PT Duration 4 weeks   PT Treatment/Interventions ADLs/Self Care Home Management;Cryotherapy;DME Instruction;Gait training;Stair training;Functional mobility training;Therapeutic activities;Therapeutic exercise;Balance training;Neuromuscular re-education;Patient/family education;Electrical Stimulation;Iontophoresis 78m/ml Dexamethasone;Moist Heat;Orthotic Fit/Training;Manual techniques   PT Next Visit Plan Dynamic balance and static balance to improve safety during ambulation and stairs.  Add vector stance and reciprocal stair training next session.     PT Home Exercise Plan Eval: continue HHPT HEP and old HEP from this clinic; add heel raises, standing HS curls, eccentric stand to sit, balance with one foot forward       Patient will benefit from skilled therapeutic intervention in order to improve the following deficits and impairments:  Abnormal gait, Decreased coordination, Decreased mobility, Postural dysfunction, Decreased strength, Decreased balance, Difficulty walking, Decreased safety awareness, Impaired flexibility  Visit Diagnosis: Unsteadiness on feet  Muscle weakness (generalized)  Other abnormalities of gait and mobility       G-Codes - 1Oct 16, 20181610    Functional Assessment Tool Used (Outpatient Only) Based on skilled clinical assessment of strength, functional mobility, gait, balance    Functional Limitation Mobility: Walking and moving around   Mobility: Walking and Moving Around Current Status (([id number] At least 20 percent but less than 40 percent impaired, limited or restricted   Mobility: Walking and Moving  Around Goal Status [phone] At least 20 percent but less than 40 percent impaired, limited or restricted      Problem List Patient Active Problem List   Diagnosis Date Noted  . [place] 02/23[place]2017  . Primary osteoarthritis involving multiple joints 02/21/2017  . Smoker 02/21/2017  . Macular degeneration 11/10/2016  . Insomnia 07/20/2016  . Restless legs 07/20/2016  . Spinal stenosis in cervical region 10/14/2015  . Senile purpura ([place] 04/18/2015  . Osteopenia 01/28/2015  . Spinal stenosis of lumbar region 01/20/2015  . BPH (benign prostatic hyperplasia) 01/20/2015  . Diverticulosis of colon without hemorrhage   . Encounter for screening colonoscopy 11/21/2014  . HTN (hypertension), benign 04/17/2014  . Temporal arteritis ([facility] 04/17/2014   CIhor Austin LPTA; CBIS [phone] SStarr LakePT, DPT 4:11 PM, 110/16/[phone] 110/16/2018 4:11 PM  [place] StreetSBryson [place] [address]Phone: [phone]  Fax:  [phone] Name: Gerald ADRIANMRN: [id number] of Birth: 117-Jun-1936

## 2017-07-28 ENCOUNTER — Ambulatory Visit (HOSPITAL_COMMUNITY): Payer: Medicare Other

## 2017-07-28 DIAGNOSIS — M6281 Muscle weakness (generalized): Secondary | ICD-10-CM

## 2017-07-28 DIAGNOSIS — R2681 Unsteadiness on feet: Secondary | ICD-10-CM

## 2017-07-28 DIAGNOSIS — R2689 Other abnormalities of gait and mobility: Secondary | ICD-10-CM

## 2017-07-28 NOTE — Therapy (Signed)
[place], [place], [address] Phone: [phone]   Fax:  [phone]  Physical Therapy Treatment  Patient Details  Name: Gerald Hall MRN: [id number] Date of Birth: [birth year] Referring Provider: Sallee Lange  Encounter Date: 07/28/2017      PT End of Session - 07/28/17 1305    Visit Number 11   Number of Visits 18   Date for PT Re-Evaluation 08/20/17   Authorization Type Medicare and [employer]; GCode complete 10th session on 07/26/2017   Authorization Time Period 06/22/17 to 07/23/17   Authorization - Visit Number 11   Authorization - Number of Visits 20   PT Start Time 1301   PT Stop Time 1351   PT Time Calculation (min) 50 min   Equipment Utilized During Treatment Gait belt   Activity Tolerance Patient tolerated treatment well;Patient limited by fatigue   Behavior During Therapy [facility] for tasks assessed/performed      Past Medical History:  Diagnosis Date  . Arthritis   . Balance problem    uses a walker  . Blood in urine    Sees urology yearly  . Deafness in right ear age 30  . Hyperlipidemia   . Hypertension   . Temporal arteritis ([place]) 2016  . Urinary incontinence     Past Surgical History:  Procedure Laterality Date  . BACK SURGERY  2010  . CATARACT EXTRACTION Bilateral 07/30/14, 08/13/2014  . CERVICAL SPINE SURGERY  09/01/15   C 4-5 , [place]  . COLONOSCOPY  12/19/2007   RMR: 1. External hemorrohids, otherwise normal rectum.2. Normal colon. 3. Normal terminal ileum.  . COLONOSCOPY N/A 12/13/2014   Procedure: COLONOSCOPY;  Surgeon: Daneil Dolin, MD;  [facility];  Service: Endoscopy;  Laterality: N/A;  11:15 Pt Request Time  . KNEE SURGERY Left    around 2000, arthroscopic  . TOTAL HIP ARTHROPLASTY Right 02/23/2017   Procedure: TOTAL HIP ARTHROPLASTY ANTERIOR APPROACH;  Surgeon: Hessie Knows, MD;  Location: [facility] ORS;  Service: Orthopedics;  Laterality: Right;     There were no vitals filed for this visit.      Subjective Assessment - 07/28/17 1259    Subjective Pt stated he feels good today, no reports of pain.  Feels his balance has main minimal improvements   Pertinent History HTN, BPH, hx lumbar and cervical stenosis, macular degeneration, has used walker for 2 years, history of cervical/lumbar/L knee surgeries, R anterior hip 2018.   Patient Stated Goals improve balance and strength    Currently in Pain? No/denies              [facility] Adult PT Treatment/Exercise - 07/28/17 0001      Knee/Hip Exercises: Aerobic   Nustep 8' L3 no charge at EOS     Knee/Hip Exercises: Standing   Functional Squat 20 reps   Functional Squat Limitations front of chair   Stairs 3RT reciprocal             Balance Exercises - 07/28/17 1336      Balance Exercises: Standing   Tandem Stance Eyes open;Foam/compliant surface;3 reps;30 secs   SLS 5 reps  L9", Rt 12"   SLS with Vectors Solid surface;3 reps  3x 5" hhA   Tandem Gait 2 reps   Sidestepping 2 reps;Theraband  RTB   Step Over Hurdles / Cones 6 and 12in 2 RT forward and sidestep   Marching Limitations 10x alternating with intermittent  HHA             PT Short Term Goals - 07/21/17 1351      PT SHORT TERM GOAL #1   Title Pt will demo consistency and independence with his HEP to improve strength and mobility.    Time 2   Period Weeks   Status On-going     PT SHORT TERM GOAL #2   Title Pt will demo resolutation of hamstring length/flexibility impairments B in order to improve posture and gait    Time 2   Period Weeks   Status New     PT SHORT TERM GOAL #3   Title Patient to be able to perform sit to stand without UEs and without multiple attempts and no unsteadiness upon reaching standing in order to demonstrate improved functional mobility    Baseline Able to and notes he doesn't have any trouble, however, required momentum this session 1 trial.    Time 2   Period Weeks    Status Partially Met           PT Long Term Goals - 07/21/17 1350      PT LONG TERM GOAL #1   Title Pt will demo strength as having improved by at least 1 MMT grade in all tested groups in order to improve balance and functional activity    Time 4   Period Weeks   Status Achieved     PT LONG TERM GOAL #2   Title Pt will perform 5x sit to stand without UE support in less than 12 sec, to improve his safety/independence getting in and out of chairs at home.    Baseline 12.73 seconds   Time 4   Period Weeks   Status On-going     PT LONG TERM GOAL #3   Title Pt will perform TUG in less than 12 sec, with LRAD to indicate he is at a decreased risk of falling in the community.    Baseline 12.08 sec using rollator   Time 4   Period Weeks   Status Partially Met     PT LONG TERM GOAL #4   Title Pt will perform SLS on each LE up to 10 sec without LOB, 3/5 trials, to decrease his risk of falling during ambulation/stair negotiation.    Baseline Single leg stance 1-2 seconds bilaterally    Time 4   Period Weeks   Status On-going               Plan - 07/28/17 1343    Clinical Impression Statement Continue with primarly balance training incorporating some gluteal strengthening as well.  Added vector stance for hip stability and began stair training reciprocal pattern.  Pt demonstrates weakness and some pain descending Lt knee.  Pt making slow gains with improved time with 5 STS in 11.36" and improved confidence with single leg activities.  Continues to required min A for safety to reduce risk of falls   Rehab Potential Good   Clinical Impairments Affecting Rehab Potential (+) motivated to participate, moderate success with last course of PT; (-) chronicity of impairment, recurrence of impairment    PT Frequency 2x / week   PT Duration 4 weeks   PT Treatment/Interventions ADLs/Self Care Home Management;Cryotherapy;DME Instruction;Gait training;Stair training;Functional mobility  training;Therapeutic activities;Therapeutic exercise;Balance training;Neuromuscular re-education;Patient/family education;Electrical Stimulation;Iontophoresis 68m/ml Dexamethasone;Moist Heat;Orthotic Fit/Training;Manual techniques   PT Next Visit Plan Dynamic balance and static balance to improve safety during ambulation and stairs.  Begin retro gait next  session.     PT Home Exercise Plan Eval: continue HHPT HEP and old HEP from this clinic; add heel raises, standing HS curls, eccentric stand to sit, balance with one foot forward       Patient will benefit from skilled therapeutic intervention in order to improve the following deficits and impairments:  Abnormal gait, Decreased coordination, Decreased mobility, Postural dysfunction, Decreased strength, Decreased balance, Difficulty walking, Decreased safety awareness, Impaired flexibility  Visit Diagnosis: Unsteadiness on feet  Muscle weakness (generalized)  Other abnormalities of gait and mobility     Problem List Patient Active Problem List   Diagnosis Date Noted  . Avascular necrosis of bone of right hip ([place]) 02/23/2017  . Primary osteoarthritis involving multiple joints 02/21/2017  . Smoker 02/21/2017  . Macular degeneration 11/10/2016  . Insomnia 07/20/2016  . Restless legs 07/20/2016  . Spinal stenosis in cervical region 10/14/2015  . Senile purpura ([place]) 04/18/2015  . Osteopenia 01/28/2015  . Spinal stenosis of lumbar region 01/20/2015  . BPH (benign prostatic hyperplasia) 01/20/2015  . Diverticulosis of colon without hemorrhage   . Encounter for screening colonoscopy 11/21/2014  . HTN (hypertension), benign 04/17/2014  . Temporal arteritis [facility]) 04/17/2014   Ihor Austin, LPTA; [place]  Aldona Lento 07/28/2017, 1:49 PM  [facility] [address] [place], [place], [address] Phone: [phone]   Fax:  [phone]  Name: Gerald Hall MRN:  [id number] Date of Birth: [birth year]

## 2017-08-02 ENCOUNTER — Encounter (HOSPITAL_COMMUNITY): Payer: Self-pay | Admitting: Physical Therapy

## 2017-08-02 ENCOUNTER — Ambulatory Visit (HOSPITAL_COMMUNITY): Payer: Medicare Other | Admitting: Physical Therapy

## 2017-08-02 DIAGNOSIS — R2689 Other abnormalities of gait and mobility: Secondary | ICD-10-CM

## 2017-08-02 DIAGNOSIS — M6281 Muscle weakness (generalized): Secondary | ICD-10-CM

## 2017-08-02 DIAGNOSIS — R2681 Unsteadiness on feet: Secondary | ICD-10-CM | POA: Diagnosis not present

## 2017-08-02 NOTE — Therapy (Signed)
[place], [place], [address] Phone: [phone]   Fax:  [phone]  Physical Therapy Treatment  Patient Details  Name: Gerald Hall MRN: [id number] Date of Birth: 1935/03/09 Referring Provider: Sallee Lange  Encounter Date: 08/02/2017      PT End of Session - 08/02/17 1200    Visit Number 12   Number of Visits 18   Date for PT Re-Evaluation 08/20/17   Authorization Type Medicare and [employer]; GCode complete 10th session on 07/26/2017   Authorization Time Period 06/22/17 to 07/23/17   Authorization - Visit Number 12   Authorization - Number of Visits 20   PT Start Time 1118   PT Stop Time 1157   PT Time Calculation (min) 39 min   Equipment Utilized During Treatment Gait belt   Activity Tolerance Patient tolerated treatment well   Behavior During Therapy [facility] for tasks assessed/performed      Past Medical History:  Diagnosis Date  . Arthritis   . Balance problem    uses a walker  . Blood in urine    Sees urology yearly  . Deafness in right ear age 47  . Hyperlipidemia   . Hypertension   . Temporal arteritis ([place]) 2016  . Urinary incontinence     Past Surgical History:  Procedure Laterality Date  . BACK SURGERY  2010  . CATARACT EXTRACTION Bilateral 07/30/14, 08/13/2014  . CERVICAL SPINE SURGERY  09/01/15   C 4-5 , [place]  . COLONOSCOPY  12/19/2007   RMR: 1. External hemorrohids, otherwise normal rectum.2. Normal colon. 3. Normal terminal ileum.  . COLONOSCOPY N/A 12/13/2014   Procedure: COLONOSCOPY;  Surgeon: Daneil Dolin, MD;  [facility];  Service: Endoscopy;  Laterality: N/A;  11:15 Pt Request Time  . KNEE SURGERY Left    around 2000, arthroscopic  . TOTAL HIP ARTHROPLASTY Right 02/23/2017   Procedure: TOTAL HIP ARTHROPLASTY ANTERIOR APPROACH;  Surgeon: Hessie Knows, MD;  Location: [facility] ORS;  Service: Orthopedics;  Laterality: Right;    There were no vitals  filed for this visit.      Subjective Assessment - 08/02/17 1121    Subjective Patient arrives with no major changes, continues to be concerned about his balance. His birthday is on [birth year].    Pertinent History HTN, BPH, hx lumbar and cervical stenosis, macular degeneration, has used walker for 2 years, history of cervical/lumbar/L knee surgeries, R anterior hip 2018.   Patient Stated Goals improve balance and strength    Currently in Pain? No/denies                         [facility] Adult PT Treatment/Exercise - 08/02/17 0001      Knee/Hip Exercises: Aerobic   Nustep 8' L3 no charge at [facility]     Knee/Hip Exercises: Seated   Long [employer] 15 reps   Long Arc Quad Weight 8 lbs.   Long Arc Quad Limitations 2 second holds    Clamshell with [employer]  1x20             Balance Exercises - 08/02/17 1132      Balance Exercises: Standing   Tandem Stance Eyes open;Foam/compliant surface;3 reps;20 secs  second round of 2 laps tandem walk on foam    Tandem Gait 4 reps;Forward;Retro   Retro Gait 4 reps   Step Over Hurdles / Cones 6 inch forward and  lateral on foam runway intermittent HHA    Marching Limitations x20 B on foam no HHA    Other Standing Exercises cone taps 4 cones/3 cone combo intermittent HHA            PT Education - 08/02/17 1200    Education provided No          PT Short Term Goals - 07/21/17 1351      PT SHORT TERM GOAL #1   Title Pt will demo consistency and independence with his HEP to improve strength and mobility.    Time 2   Period Weeks   Status On-going     PT SHORT TERM GOAL #2   Title Pt will demo resolutation of hamstring length/flexibility impairments B in order to improve posture and gait    Time 2   Period Weeks   Status New     PT SHORT TERM GOAL #3   Title Patient to be able to perform sit to stand without UEs and without multiple attempts and no unsteadiness upon reaching standing in order to demonstrate improved  functional mobility    Baseline Able to and notes he doesn't have any trouble, however, required momentum this session 1 trial.    Time 2   Period Weeks   Status Partially Met           PT Long Term Goals - 07/21/17 1350      PT LONG TERM GOAL #1   Title Pt will demo strength as having improved by at least 1 MMT grade in all tested groups in order to improve balance and functional activity    Time 4   Period Weeks   Status Achieved     PT LONG TERM GOAL #2   Title Pt will perform 5x sit to stand without UE support in less than 12 sec, to improve his safety/independence getting in and out of chairs at home.    Baseline 12.73 seconds   Time 4   Period Weeks   Status On-going     PT LONG TERM GOAL #3   Title Pt will perform TUG in less than 12 sec, with LRAD to indicate he is at a decreased risk of falling in the community.    Baseline 12.08 sec using rollator   Time 4   Period Weeks   Status Partially Met     PT LONG TERM GOAL #4   Title Pt will perform SLS on each LE up to 10 sec without LOB, 3/5 trials, to decrease his risk of falling during ambulation/stair negotiation.    Baseline Single leg stance 1-2 seconds bilaterally    Time 4   Period Weeks   Status On-going               Plan - 08/02/17 1200    Clinical Impression Statement Continued general focus on balance today, introducing retrogait as well as tandem gait and hurdle navigation on foam. Patient doing very well in general but limited today due to increasing L knee pain/discomfort with standing exercises; brief period of seated strengthening performed to rest L knee/reduce L knee pain before finishing with more standing balance tasks at [facility].    Rehab Potential Good   Clinical Impairments Affecting Rehab Potential (+) motivated to participate, moderate success with last course of PT; (-) chronicity of impairment, recurrence of impairment    PT Frequency 2x / week   PT Duration 4 weeks   PT  Treatment/Interventions ADLs/Self Care  Home Management;Cryotherapy;DME Instruction;Gait training;Stair training;Functional mobility training;Therapeutic activities;Therapeutic exercise;Balance training;Neuromuscular re-education;Patient/family education;Electrical Stimulation;Iontophoresis 26m/ml Dexamethasone;Moist Heat;Orthotic Fit/Training;Manual techniques   PT Next Visit Plan Dynamic balance and static balance to improve safety during ambulation and stairs. continue retro gait next session.     PT Home Exercise Plan Eval: continue HHPT HEP and old HEP from this clinic; add heel raises, standing HS curls, eccentric stand to sit, balance with one foot forward    Consulted and Agree with Plan of Care Patient      Patient will benefit from skilled therapeutic intervention in order to improve the following deficits and impairments:  Abnormal gait, Decreased coordination, Decreased mobility, Postural dysfunction, Decreased strength, Decreased balance, Difficulty walking, Decreased safety awareness, Impaired flexibility  Visit Diagnosis: Unsteadiness on feet  Muscle weakness (generalized)  Other abnormalities of gait and mobility     Problem List Patient Active Problem List   Diagnosis Date Noted  . Avascular necrosis of bone of right [place] ([place] 02/23/2017  . Primary osteoarthritis involving multiple joints 02/21/2017  . Smoker 02/21/2017  . Macular degeneration 11/10/2016  . Insomnia 07/20/2016  . Restless legs 07/20/2016  . Spinal stenosis in cervical region 10/14/2015  . Senile purpura ([place] 04/18/2015  . Osteopenia 01/28/2015  . Spinal stenosis of lumbar region 01/20/2015  . BPH (benign prostatic hyperplasia) 01/20/2015  . Diverticulosis of colon without hemorrhage   . Encounter for screening colonoscopy 11/21/2014  . HTN (hypertension), benign 04/17/2014  . Temporal arteritis ([place] 04/17/2014    KDeniece ReePT, DPT [phone] [id number] [address]Phone: [phone]  Fax:  [phone] Name: Gerald HOLLABAUGHMRN: [id number] of Birth: [birth year]

## 2017-08-04 ENCOUNTER — Ambulatory Visit (HOSPITAL_COMMUNITY): Payer: Medicare Other | Admitting: Physical Therapy

## 2017-08-04 ENCOUNTER — Encounter (HOSPITAL_COMMUNITY): Payer: Self-pay | Admitting: Physical Therapy

## 2017-08-04 DIAGNOSIS — R2689 Other abnormalities of gait and mobility: Secondary | ICD-10-CM

## 2017-08-04 DIAGNOSIS — R2681 Unsteadiness on feet: Secondary | ICD-10-CM

## 2017-08-04 DIAGNOSIS — M6281 Muscle weakness (generalized): Secondary | ICD-10-CM

## 2017-08-04 NOTE — Therapy (Signed)
[place], [place], [address] Phone: [phone]   Fax:  [phone]  Physical Therapy Treatment  Patient Details  Name: Gerald Hall MRN: [id number] Date of Birth: [birth year] Referring Provider: Sallee Lange  Encounter Date: 08/04/2017      PT End of Session - 08/04/17 1203    Visit Number 13   Number of Visits 18   Date for PT Re-Evaluation 08/20/17   Authorization Type Medicare and [employer]; GCode complete 10th session on 07/26/2017   Authorization Time Period 06/22/17 to 07/23/17   Authorization - Visit Number 13   Authorization - Number of Visits 20   PT Start Time 1118   PT Stop Time 1156   PT Time Calculation (min) 38 min   Equipment Utilized During Treatment Gait belt   Activity Tolerance Patient tolerated treatment well   Behavior During Therapy WFL for tasks assessed/performed      Past Medical History:  Diagnosis Date  . Arthritis   . Balance problem    uses a walker  . Blood in urine    Sees urology yearly  . Deafness in right ear age 57  . Hyperlipidemia   . Hypertension   . Temporal arteritis ([place]) 2016  . Urinary incontinence     Past Surgical History:  Procedure Laterality Date  . BACK SURGERY  2010  . CATARACT EXTRACTION Bilateral 07/30/14, 08/13/2014  . CERVICAL SPINE SURGERY  09/01/15   C 4-5 , [place]  . COLONOSCOPY  12/19/2007   RMR: 1. External hemorrohids, otherwise normal rectum.2. Normal colon. 3. Normal terminal ileum.  . COLONOSCOPY N/A 12/13/2014   Procedure: COLONOSCOPY;  Surgeon: Daneil Dolin, MD;  [facility];  Service: Endoscopy;  Laterality: N/A;  11:15 Pt Request Time  . KNEE SURGERY Left    around 2000, arthroscopic  . TOTAL HIP ARTHROPLASTY Right 02/23/2017   Procedure: TOTAL HIP ARTHROPLASTY ANTERIOR APPROACH;  Surgeon: Hessie Knows, MD;  Location: [facility] ORS;  Service: Orthopedics;  Laterality: Right;    There were no  vitals filed for this visit.      Subjective Assessment - 08/04/17 1121    Subjective Patient arrives stating he is doing well, he will be out of town next week but then will be back to PT    Pertinent History HTN, BPH, hx lumbar and cervical stenosis, macular degeneration, has used walker for 2 years, history of cervical/lumbar/L knee surgeries, R anterior hip 2018.   Patient Stated Goals improve balance and strength    Currently in Pain? No/denies                         [facility] Adult PT Treatment/Exercise - 08/04/17 0001      Knee/Hip Exercises: Standing   Forward Lunges Both;1 set;10 reps   Forward Lunges Limitations BOSU              Balance Exercises - 08/04/17 1126      Balance Exercises: Standing   Tandem Stance Eyes open;Foam/compliant surface;Other (comment)  ball toss on foam, random directions    Tandem Gait 5 reps;Other (comment)  1 lap off foam, 4 laps on foam balance beam    Sidestepping 4 reps;Foam/compliant support   Other Standing Exercises toe taps on BOSU whle standing on foam 1x20; cone taps on solid surface 4 cones/combo of 4; sideways BOSU 1x10 B on foam pad; heel and toe  rasies on foam pad 1x20; standnig marches 1x20 on foam pad             PT Education - 08/04/17 1203    Education provided No          PT Short Term Goals - 07/21/17 1351      PT SHORT TERM GOAL #1   Title Pt will demo consistency and independence with his HEP to improve strength and mobility.    Time 2   Period Weeks   Status On-going     PT SHORT TERM GOAL #2   Title Pt will demo resolutation of hamstring length/flexibility impairments B in order to improve posture and gait    Time 2   Period Weeks   Status New     PT SHORT TERM GOAL #3   Title Patient to be able to perform sit to stand without UEs and without multiple attempts and no unsteadiness upon reaching standing in order to demonstrate improved functional mobility    Baseline Able to and  notes he doesn't have any trouble, however, required momentum this session 1 trial.    Time 2   Period Weeks   Status Partially Met           PT Long Term Goals - 07/21/17 1350      PT LONG TERM GOAL #1   Title Pt will demo strength as having improved by at least 1 MMT grade in all tested groups in order to improve balance and functional activity    Time 4   Period Weeks   Status Achieved     PT LONG TERM GOAL #2   Title Pt will perform 5x sit to stand without UE support in less than 12 sec, to improve his safety/independence getting in and out of chairs at home.    Baseline 12.73 seconds   Time 4   Period Weeks   Status On-going     PT LONG TERM GOAL #3   Title Pt will perform TUG in less than 12 sec, with LRAD to indicate he is at a decreased risk of falling in the community.    Baseline 12.08 sec using rollator   Time 4   Period Weeks   Status Partially Met     PT LONG TERM GOAL #4   Title Pt will perform SLS on each LE up to 10 sec without LOB, 3/5 trials, to decrease his risk of falling during ambulation/stair negotiation.    Baseline Single leg stance 1-2 seconds bilaterally    Time 4   Period Weeks   Status On-going               Plan - 08/04/17 1203    Clinical Impression Statement Continued with functional balance training today in parallel bars with patient continuing to require min guard to min assist, occasional mod assist with challenging dynamic exercises. Balance and strength continue to remain his primary impairments at this time. Ended on Nustep at [facility], not included in billing.    Rehab Potential Good   Clinical Impairments Affecting Rehab Potential (+) motivated to participate, moderate success with last course of PT; (-) chronicity of impairment, recurrence of impairment    PT Frequency 2x / week   PT Duration 4 weeks   PT Treatment/Interventions ADLs/Self Care Home Management;Cryotherapy;DME Instruction;Gait training;Stair training;Functional  mobility training;Therapeutic activities;Therapeutic exercise;Balance training;Neuromuscular re-education;Patient/family education;Electrical Stimulation;Iontophoresis 23m/ml Dexamethasone;Moist Heat;Orthotic Fit/Training;Manual techniques   PT Next Visit Plan Dynamic balance and static balance to  improve safety during ambulation and stairs. continue retro gait next session.     PT Home Exercise Plan Eval: continue HHPT HEP and old HEP from this clinic; add heel raises, standing HS curls, eccentric stand to sit, balance with one foot forward       Patient will benefit from skilled therapeutic intervention in order to improve the following deficits and impairments:  Abnormal gait, Decreased coordination, Decreased mobility, Postural dysfunction, Decreased strength, Decreased balance, Difficulty walking, Decreased safety awareness, Impaired flexibility  Visit Diagnosis: Unsteadiness on feet  Muscle weakness (generalized)  Other abnormalities of gait and mobility     Problem List Patient Active Problem List   Diagnosis Date Noted  . Avascular necrosis of bone of right hip ([place]) 02/23/2017  . Primary osteoarthritis involving multiple joints 02/21/2017  . Smoker 02/21/2017  . Macular degeneration 11/10/2016  . Insomnia 07/20/2016  . Restless legs 07/20/2016  . Spinal stenosis in cervical region 10/14/2015  . Senile purpura ([place]) 04/18/2015  . Osteopenia 01/28/2015  . Spinal stenosis of lumbar region 01/20/2015  . BPH (benign prostatic hyperplasia) 01/20/2015  . Diverticulosis of colon without hemorrhage   . Encounter for screening colonoscopy 11/21/2014  . HTN (hypertension), benign 04/17/2014  . Temporal arteritis ([place]) 04/17/2014    Deniece Ree PT, DPT [phone]  [place] [address] [place], [place], [address] Phone: [phone]   Fax:  [phone]  Name: Gerald Hall MRN: [id number] Date of Birth: Mar 25, 1935

## 2017-08-09 ENCOUNTER — Encounter (HOSPITAL_COMMUNITY): Payer: Medicare Other | Admitting: Physical Therapy

## 2017-08-11 ENCOUNTER — Encounter (HOSPITAL_COMMUNITY): Payer: Medicare Other

## 2017-08-16 ENCOUNTER — Encounter (HOSPITAL_COMMUNITY): Payer: Self-pay | Admitting: Physical Therapy

## 2017-08-16 ENCOUNTER — Ambulatory Visit (HOSPITAL_COMMUNITY): Payer: Medicare Other | Admitting: Physical Therapy

## 2017-08-16 DIAGNOSIS — R2689 Other abnormalities of gait and mobility: Secondary | ICD-10-CM | POA: Diagnosis not present

## 2017-08-16 DIAGNOSIS — M6281 Muscle weakness (generalized): Secondary | ICD-10-CM

## 2017-08-16 DIAGNOSIS — R2681 Unsteadiness on feet: Secondary | ICD-10-CM | POA: Diagnosis not present

## 2017-08-16 NOTE — Therapy (Signed)
[place], [place], [address] Phone: [phone]   Fax:  [phone]  Physical Therapy Treatment (Re-Assessment)  Patient Details  Name: Gerald Hall MRN: [id number] Date of Birth: [birth year] Referring Provider: Sallee Lange   Encounter Date: 08/16/2017      PT End of Session - 08/16/17 1630    Visit Number 14   Number of Visits 18   Date for PT Re-Evaluation 09/13/17   Authorization Type Medicare and [employer]; GCode complete 14th session    Authorization Time Period 06/22/17 to 4/48/18; new cert 5/63 to 14/97; newest cert 02/63 to 78/58   Authorization - Visit Number 14   Authorization - Number of Visits 24   PT Start Time 8502   PT Stop Time 1513   PT Time Calculation (min) 40 min   Activity Tolerance Patient tolerated treatment well   Behavior During Therapy [facility] for tasks assessed/performed      Past Medical History:  Diagnosis Date  . Arthritis   . Balance problem    uses a walker  . Blood in urine    Sees urology yearly  . Deafness in right ear age 25  . Hyperlipidemia   . Hypertension   . Temporal arteritis ([place] [place] [place]) 2016  . Urinary incontinence     Past Surgical History:  Procedure Laterality Date  . BACK SURGERY  2010  . CATARACT EXTRACTION Bilateral 07/30/14, 08/13/2014  . CERVICAL SPINE SURGERY  09/01/15   C 4-5 , [place]  . COLONOSCOPY  12/19/2007   RMR: 1. External hemorrohids, otherwise normal rectum.2. Normal colon. 3. Normal terminal ileum.  . COLONOSCOPY N/A 12/13/2014   Procedure: COLONOSCOPY;  Surgeon: Daneil Dolin, MD;  [facility];  Service: Endoscopy;  Laterality: N/A;  11:15 Pt Request Time  . KNEE SURGERY Left    around 2000, arthroscopic  . TOTAL HIP ARTHROPLASTY Right 02/23/2017   Procedure: TOTAL HIP ARTHROPLASTY ANTERIOR APPROACH;  Surgeon: Hessie Knows, MD;  Location: [facility] ORS;  Service: Orthopedics;  Laterality: Right;    There were  no vitals filed for this visit.      Subjective Assessment - 08/16/17 1436    Subjective Patient arrvies after a week long vacation at [facility]; he reports that walking is the hardest thing for him to do right now. He rates himself as being 50/100 with balance and pain being some concerns. He feels he is ahead of where he was before hip surgery. He feels balance is the biggest thing he needs to work on.    Pertinent History HTN, BPH, hx lumbar and cervical stenosis, macular degeneration, has used walker for 2 years, history of cervical/lumbar/L knee surgeries, R anterior hip 2018.   How long can you sit comfortably? 10/23- unlimited    How long can you stand comfortably? 10/23- 15 minutes with walker    How long can you walk comfortably? 10/23- 200 to 300 yards with walker, limited by knee pain to some extent    Patient Stated Goals improve balance and strength    Currently in Pain? No/denies            [facility] PT Assessment - 08/16/17 0001      Assessment   Medical Diagnosis balance, restless leg    Referring Provider [name]    Onset Date/Surgical Date --  anterior hip in May 2018, balance has been chronic issue    Next MD Visit  Dr. Wolfgang Phoenix in 4-6 months      Precautions   Precautions Anterior Hip     Restrictions   Weight Bearing Restrictions No     Balance Screen   Has the patient fallen in the past 6 months No   Has the patient had a decrease in activity level because of a fear of falling?  Yes   Is the patient reluctant to leave their home because of a fear of falling?  No     Prior Function   Level of Independence Independent;Independent with basic ADLs;Independent with gait;Independent with transfers   Vocation Retired     Strength   Right Hip Flexion 4+/5   Right Hip ABduction 3+/5   Left Hip Flexion 4+/5   Left Hip ABduction 3/5   Right Knee Flexion 4+/5   Right Knee Extension 4+/5   Left Knee Flexion 4+/5   Left Knee Extension 4/5   Right  Ankle Dorsiflexion 5/5   Left Ankle Dorsiflexion 5/5     Flexibility   Hamstrings mild limition hamstring R and Lt.      Transfers   Five time sit to stand comments       6 minute walk test results    Aerobic Endurance Distance Walked 622   Endurance additional comments 3MWT walker      Standardized Balance Assessment   Five times sit to stand comments  12.31     Timed Up and Go Test   Normal TUG (seconds) 14     High Level Balance   High Level Balance Comments SLS 1-3 seconds                              PT Education - 08/16/17 1630    Education provided Yes   Education Details objective measures found today, POC moving forward, HEP updates    Person(s) Educated Patient   Methods Explanation;Demonstration;Handout   Comprehension Verbalized understanding          PT Short Term Goals - 08/16/17 1456      PT SHORT TERM GOAL #1   Title Pt will demo consistency and independence with his HEP to improve strength and mobility.    Baseline 10/23- compliant    Time 2   Period Weeks   Status Achieved     PT SHORT TERM GOAL #2   Title Pt will demo resolutation of hamstring length/flexibility impairments B in order to improve posture and gait    Baseline 10/23- remain somewhat limited    Time 2   Period Weeks   Status On-going     PT SHORT TERM GOAL #3   Title Patient to be able to perform sit to stand without UEs and without multiple attempts and no unsteadiness upon reaching standing in order to demonstrate improved functional mobility    Baseline 10/23- improved    Time 2   Period Weeks   Status Partially Met           PT Long Term Goals - 08/16/17 1458      PT LONG TERM GOAL #1   Title Pt will demo strength as having improved by at least 1 MMT grade in all tested groups in order to improve balance and functional activity    Baseline 10/23- no significant change    Time 4   Period Weeks   Status Achieved     PT LONG TERM GOAL #2  Title Pt will perform 5x sit to stand without UE support in less than 12 sec, to improve his safety/independence getting in and out of chairs at home.    Baseline September 07, 2023-  15 seconds    Time 4   Period Weeks   Status On-going     PT LONG TERM GOAL #3   Title Pt will perform TUG in less than 12 sec, with LRAD to indicate he is at a decreased risk of falling in the community.    Baseline 09/07/2023- 14 seconds    Time 4   Period Weeks   Status On-going     PT LONG TERM GOAL #4   Title Pt will perform SLS on each LE up to 10 sec without LOB, 3/5 trials, to decrease his risk of falling during ambulation/stair negotiation.    Baseline 09/07/23- 1-3 seconds    Time 4   Period Weeks   Status On-going               Plan - 06-Sep-2017 1632    Clinical Impression Statement Re-assessment performed today. Patient does not appear to have made significant functional progress since the time of last re-assessment at this time based off of objective measures; however, recommend 4 more additional sessions for HEP advancement as well as further proactive balance training before discharge to independent HEP/exercise program.    Rehab Potential Good   Clinical Impairments Affecting Rehab Potential (+) motivated to participate, moderate success with last course of PT; (-) chronicity of impairment, recurrence of impairment    PT Frequency 1x / week   PT Duration 4 weeks   PT Treatment/Interventions ADLs/Self Care Home Management;Cryotherapy;DME Instruction;Gait training;Stair training;Functional mobility training;Therapeutic activities;Therapeutic exercise;Balance training;Neuromuscular re-education;Patient/family education;Electrical Stimulation;Iontophoresis 32m/ml Dexamethasone;Moist Heat;Orthotic Fit/Training;Manual techniques   PT Next Visit Plan update HEP each session. Continue balance focus, strengthening as tolerated.    PT Home Exercise Plan Eval: continue HHPT HEP and old HEP from this clinic; add heel  raises, standing HS curls, eccentric stand to sit, balance with one foot forward 1Nov 13, 2024 HS stretch, side stepping with green TB    Consulted and Agree with Plan of Care Patient      Patient will benefit from skilled therapeutic intervention in order to improve the following deficits and impairments:  Abnormal gait, Decreased coordination, Decreased mobility, Postural dysfunction, Decreased strength, Decreased balance, Difficulty walking, Decreased safety awareness, Impaired flexibility  Visit Diagnosis: Unsteadiness on feet - Plan: PT plan of care cert/re-cert  Muscle weakness (generalized) - Plan: PT plan of care cert/re-cert  Other abnormalities of gait and mobility - Plan: PT plan of care cert/re-cert       G-Codes - [id number]    Functional Assessment Tool Used (Outpatient Only) Based on skilled clinical assessment of strength, functional mobility, gait, balance    Functional Limitation Mobility: Walking and moving around   Mobility: Walking and Moving Around Current Status (([id number] At least 20 percent but less than 40 percent impaired, limited or restricted   Mobility: Walking and Moving Around Goal Status [phone] At least 20 percent but less than 40 percent impaired, limited or restricted      Problem List Patient Active Problem List   Diagnosis Date Noted  . Avascular necrosis of bone of right hip ([facility] 02/23/2017  . Primary osteoarthritis involving multiple joints 02/21/2017  . Smoker 02/21/2017  . Macular degeneration 11/10/2016  . Insomnia 07/20/2016  . Restless legs 07/20/2016  . Spinal stenosis in cervical region 10/14/2015  . Senile  purpura ([place]) 04/18/2015  . Osteopenia 01/28/2015  . Spinal stenosis of lumbar region 01/20/2015  . BPH (benign prostatic hyperplasia) 01/20/2015  . Diverticulosis of colon without hemorrhage   . Encounter for screening colonoscopy 11/21/2014  . HTN (hypertension), benign 04/17/2014  . Temporal arteritis ([place]) 04/17/2014     Deniece Ree PT, DPT [phone]  [place] [address] [place], [place], [address] Phone: [phone]   Fax:  [phone]  Name: Gerald Hall MRN: [id number] Date of Birth: October 01, 1935

## 2017-08-16 NOTE — Patient Instructions (Signed)
   SEATED HAMSTRING STRETCH  While seated, rest your heel on the floor with your knee straight and gently lean forward until a stretch is felt behind your knee/thigh.  Hold for at least 30 seconds.  Repeat 3 times each leg, twice a day.    ELASTIC BAND LATERAL WALKS - PROXIMAL   With an elastic band around your thighs, take steps to the side while keeping your feet spread apart. Keep your knees bent the entire time.  Repeat 3 back and forth lengths of your kitchen counter, twice a day.   Use the green band; when this feels easy, use the blue.

## 2017-08-18 ENCOUNTER — Ambulatory Visit (HOSPITAL_COMMUNITY): Payer: Medicare Other | Admitting: Physical Therapy

## 2017-08-24 ENCOUNTER — Ambulatory Visit (HOSPITAL_COMMUNITY): Payer: Medicare Other

## 2017-08-24 ENCOUNTER — Encounter (HOSPITAL_COMMUNITY): Payer: Self-pay

## 2017-08-24 DIAGNOSIS — R2689 Other abnormalities of gait and mobility: Secondary | ICD-10-CM | POA: Diagnosis not present

## 2017-08-24 DIAGNOSIS — R2681 Unsteadiness on feet: Secondary | ICD-10-CM

## 2017-08-24 DIAGNOSIS — M6281 Muscle weakness (generalized): Secondary | ICD-10-CM

## 2017-08-24 NOTE — Therapy (Signed)
[place], [place], [address] Phone: [phone]   Fax:  [phone]  Physical Therapy Treatment  Patient Details  Name: Gerald Hall MRN: [id number] Date of Birth: [birth year] Referring Provider: Sallee Lange   Encounter Date: 08/24/2017      PT End of Session - 08/24/17 1040    Visit Number 15   Number of Visits 18   Date for PT Re-Evaluation 09/13/17   Authorization Type Medicare and [employer]; GCode complete 14th session    Authorization Time Period 06/22/17 to 4/43/15; new cert 4/00 to 86/76; newest cert 19/50 to 93/26   Authorization - Visit Number 15   Authorization - Number of Visits 24   PT Start Time 1033   PT Stop Time 1122  last 6 min on Nustep   PT Time Calculation (min) 49 min   Equipment Utilized During Treatment Gait belt   Activity Tolerance Patient tolerated treatment well   Behavior During Therapy [facility] for tasks assessed/performed      Past Medical History:  Diagnosis Date  . Arthritis   . Balance problem    uses a walker  . Blood in urine    Sees urology yearly  . Deafness in right ear age 4  . Hyperlipidemia   . Hypertension   . Temporal arteritis ([place]) 2016  . Urinary incontinence     Past Surgical History:  Procedure Laterality Date  . BACK SURGERY  2010  . CATARACT EXTRACTION Bilateral 07/30/14, 08/13/2014  . CERVICAL SPINE SURGERY  09/01/15   C 4-5 , [place]  . COLONOSCOPY  12/19/2007   RMR: 1. External hemorrohids, otherwise normal rectum.2. Normal colon. 3. Normal terminal ileum.  . COLONOSCOPY N/A 12/13/2014   Procedure: COLONOSCOPY;  Surgeon: Daneil Dolin, MD;  [facility];  Service: Endoscopy;  Laterality: N/A;  11:15 Pt Request Time  . KNEE SURGERY Left    around 2000, arthroscopic  . TOTAL HIP ARTHROPLASTY Right 02/23/2017   Procedure: TOTAL HIP ARTHROPLASTY ANTERIOR APPROACH;  Surgeon: Hessie Knows, MD;  Location: [facility] ORS;   Service: Orthopedics;  Laterality: Right;    There were no vitals filed for this visit.      Subjective Assessment - 08/24/17 1038    Subjective pt stated he has some pain Lt knee today with weight bearing.  Continues to complete HEP at home.     Pertinent History HTN, BPH, hx lumbar and cervical stenosis, macular degeneration, has used walker for 2 years, history of cervical/lumbar/L knee surgeries, R anterior hip 2018.   Patient Stated Goals improve balance and strength    Currently in Pain? Yes   Pain Score 3    Pain Location Knee   Pain Orientation Left   Pain Descriptors / Indicators Aching   Pain Type Chronic pain   Pain Onset More than a month ago   Pain Frequency Intermittent   Aggravating Factors  standing, walking, squats   Pain Relieving Factors sitting NWB   Effect of Pain on Daily Activities increases                         [facility] Adult PT Treatment/Exercise - 08/24/17 0001      Knee/Hip Exercises: Aerobic   Nustep 6' L3 at EOS, no charge     Knee/Hip Exercises: Standing   Forward Lunges Both;15 reps   Forward Lunges Limitations BOSU    Side  Lunges Both;10 reps   Side Lunges Limitations BOSu cueing for mechanics             Balance Exercises - 08/24/17 1112      Balance Exercises: Standing   Tandem Stance Eyes open;Foam/compliant surface;5 reps  Tandem stance   SLS with Vectors Solid surface;3 reps  3x 5" holds with HHA   Tandem Gait 2 reps   Step Over Hurdles / Cones 6 and 12in forward and lateral   Other Standing Exercises posture strengthening extension and rows 15x wiht RTB; marching with shoulder extension GTB 10x 3" holds             PT Short Term Goals - 08/16/17 1456      PT SHORT TERM GOAL #1   Title Pt will demo consistency and independence with his HEP to improve strength and mobility.    Baseline 10/23- compliant    Time 2   Period Weeks   Status Achieved     PT SHORT TERM GOAL #2   Title Pt will demo  resolutation of hamstring length/flexibility impairments B in order to improve posture and gait    Baseline 10/23- remain somewhat limited    Time 2   Period Weeks   Status On-going     PT SHORT TERM GOAL #3   Title Patient to be able to perform sit to stand without UEs and without multiple attempts and no unsteadiness upon reaching standing in order to demonstrate improved functional mobility    Baseline 10/23- improved    Time 2   Period Weeks   Status Partially Met           PT Long Term Goals - 08/16/17 1458      PT LONG TERM GOAL #1   Title Pt will demo strength as having improved by at least 1 MMT grade in all tested groups in order to improve balance and functional activity    Baseline 10/23- no significant change    Time 4   Period Weeks   Status Achieved     PT LONG TERM GOAL #2   Title Pt will perform 5x sit to stand without UE support in less than 12 sec, to improve his safety/independence getting in and out of chairs at home.    Baseline 10/23-  15 seconds    Time 4   Period Weeks   Status On-going     PT LONG TERM GOAL #3   Title Pt will perform TUG in less than 12 sec, with LRAD to indicate he is at a decreased risk of falling in the community.    Baseline 10/23- 14 seconds    Time 4   Period Weeks   Status On-going     PT LONG TERM GOAL #4   Title Pt will perform SLS on each LE up to 10 sec without LOB, 3/5 trials, to decrease his risk of falling during ambulation/stair negotiation.    Baseline 10/23- 1-3 seconds    Time 4   Period Weeks   Status On-going               Plan - 08/24/17 1233    Clinical Impression Statement Continued session focus iwth balance training in static and dynamic surfaces.  Pt educated on importance of posture to assist with balance, added theraband for posture strengthening and SLS with marching.  No reports of increased pain through session, was limited by fatigue with activites.     Rehab Potential  Good   Clinical  Impairments Affecting Rehab Potential (+) motivated to participate, moderate success with last course of PT; (-) chronicity of impairment, recurrence of impairment    PT Frequency 1x / week   PT Duration 4 weeks   PT Treatment/Interventions ADLs/Self Care Home Management;Cryotherapy;DME Instruction;Gait training;Stair training;Functional mobility training;Therapeutic activities;Therapeutic exercise;Balance training;Neuromuscular re-education;Patient/family education;Electrical Stimulation;Iontophoresis 47m/ml Dexamethasone;Moist Heat;Orthotic Fit/Training;Manual techniques   PT Next Visit Plan update HEP each session. Continue balance focus, strengthening as tolerated.    PT Home Exercise Plan Eval: continue HHPT HEP and old HEP from this clinic; add heel raises, standing HS curls, eccentric stand to sit, balance with one foot forward 10/23: HS stretch, side stepping with green TB    Consulted and Agree with Plan of Care Patient      Patient will benefit from skilled therapeutic intervention in order to improve the following deficits and impairments:  Abnormal gait, Decreased coordination, Decreased mobility, Postural dysfunction, Decreased strength, Decreased balance, Difficulty walking, Decreased safety awareness, Impaired flexibility  Visit Diagnosis: Unsteadiness on feet  Muscle weakness (generalized)  Other abnormalities of gait and mobility     Problem List Patient Active Problem List   Diagnosis Date Noted  . Av[place] necrosis of [place] of right hip ([place] 02/23/2017  . Primary osteoarthritis involving multiple joints 02/21/2017  . Smoker 02/21/2017  . Macular degeneration 11/10/2016  . Insomnia 07/20/2016  . Restless legs 07/20/2016  . Spinal stenosis in cervical region 10/14/2015  . Senile purpura ([place] 04/18/2015  . Osteopenia 01/28/2015  . Spinal stenosis of lumbar region 01/20/2015  . BPH (benign prostatic hyperplasia) 01/20/2015  . Diverticulosis of colon without  hemorrhage   . Encounter for screening colonoscopy 11/21/2014  . HTN (hypertension), benign 04/17/2014  . Temporal arteritis ([facility] 04/17/2014   CIhor Austin LSouth [place] CCrisman CAldona Lento10/31/2018, 12:51 PM  [address] [address]Phone: [phone]  Fax:  [phone] Name: Gerald RYBACKIMRN: [id number] of Birth: 102-14-1936

## 2017-08-27 ENCOUNTER — Other Ambulatory Visit: Payer: Self-pay | Admitting: Family Medicine

## 2017-08-27 DIAGNOSIS — Z23 Encounter for immunization: Secondary | ICD-10-CM | POA: Diagnosis not present

## 2017-08-31 ENCOUNTER — Encounter (HOSPITAL_COMMUNITY): Payer: Self-pay

## 2017-08-31 ENCOUNTER — Ambulatory Visit (HOSPITAL_COMMUNITY): Payer: Medicare Other | Attending: Family Medicine

## 2017-08-31 DIAGNOSIS — M6281 Muscle weakness (generalized): Secondary | ICD-10-CM

## 2017-08-31 DIAGNOSIS — R2689 Other abnormalities of gait and mobility: Secondary | ICD-10-CM | POA: Insufficient documentation

## 2017-08-31 DIAGNOSIS — R2681 Unsteadiness on feet: Secondary | ICD-10-CM | POA: Diagnosis not present

## 2017-08-31 NOTE — Therapy (Signed)
[place], [place], [address] Phone: [phone]   Fax:  [phone]  Physical Therapy Treatment  Patient Details  Name: Gerald Hall MRN: [id number] Date of Birth: 1935-09-17 Referring Provider: Sallee Lange    Encounter Date: 08/31/2017  PT End of Session - 08/31/17 1040    Visit Number  16    Number of Visits  18    Date for PT Re-Evaluation  09/13/17    Authorization Type  Medicare and [employer]; GCode complete 14th session     Authorization Time Period  06/22/17 to 0/24/09; new cert 7/35 to 32/99; newest cert 24/26 to 83/41    Authorization - Visit Number  16    Authorization - Number of Visits  24    PT Start Time  9622    PT Stop Time  2979 last 6 min on Nustep, no charge   last 6 min on Nustep, no charge   PT Time Calculation (min)  47 min    Equipment Utilized During Treatment  Gait belt    Activity Tolerance  Patient tolerated treatment well;No increased pain;Patient limited by fatigue    Behavior During Therapy  [facility] for tasks assessed/performed       Past Medical History:  Diagnosis Date  . Arthritis   . Balance problem    uses a walker  . Blood in urine    Sees urology yearly  . Deafness in right ear age 60  . Hyperlipidemia   . Hypertension   . Temporal arteritis ([place]) 2016  . Urinary incontinence     Past Surgical History:  Procedure Laterality Date  . BACK SURGERY  2010  . CATARACT EXTRACTION Bilateral 07/30/14, 08/13/2014  . CERVICAL SPINE SURGERY  09/01/15   C 4-5 , [place]  . COLONOSCOPY  12/19/2007   RMR: 1. External hemorrohids, otherwise normal rectum.2. Normal colon. 3. Normal terminal ileum.  Marland Kitchen KNEE SURGERY Left    around 2000, arthroscopic    There were no vitals filed for this visit.  Subjective Assessment - 08/31/17 1039    Subjective  Pt stated he continues to have difficutly with SLS at home, reports the tandem stance is getting easier.       Pertinent History  HTN, BPH, hx lumbar and cervical stenosis, macular degeneration, has used walker for 2 years, history of cervical/lumbar/L knee surgeries, R anterior hip 2018.    Patient Stated Goals  improve balance and strength     Currently in Pain?  No/denies                      [facility] Adult PT Treatment/Exercise - 08/31/17 0001      Knee/Hip Exercises: Standing   Forward Lunges  Both;15 reps    Forward Lunges Limitations  BOSU     Other Standing Knee Exercises  Postural strengtening 20x each RTB    Other Standing Knee Exercises  NBOS on foam with palvo 15x each; shoulder extension RTB with alternating march 15x 5"          Balance Exercises - 08/31/17 1053      Balance Exercises: Standing   Tandem Stance  Eyes open;Foam/compliant surface;5 reps;30 secs    SLS  5 reps    SLS with Vectors  -- 3x 5" with intermittent HHA   3x 5" with intermittent HHA   Step Over Hurdles / Cones  6 and 12in forward and  lateral          PT Short Term Goals - 08/16/17 1456      PT SHORT TERM GOAL #1   Title  Pt will demo consistency and independence with his HEP to improve strength and mobility.     Baseline  10/23- compliant     Time  2    Period  Weeks    Status  Achieved      PT SHORT TERM GOAL #2   Title  Pt will demo resolutation of hamstring length/flexibility impairments B in order to improve posture and gait     Baseline  10/23- remain somewhat limited     Time  2    Period  Weeks    Status  On-going      PT SHORT TERM GOAL #3   Title  Patient to be able to perform sit to stand without UEs and without multiple attempts and no unsteadiness upon reaching standing in order to demonstrate improved functional mobility     Baseline  10/23- improved     Time  2    Period  Weeks    Status  Partially Met        PT Long Term Goals - 08/16/17 1458      PT LONG TERM GOAL #1   Title  Pt will demo strength as having improved by at least 1 MMT grade in all tested  groups in order to improve balance and functional activity     Baseline  10/23- no significant change     Time  4    Period  Weeks    Status  Achieved      PT LONG TERM GOAL #2   Title  Pt will perform 5x sit to stand without UE support in less than 12 sec, to improve his safety/independence getting in and out of chairs at home.     Baseline  10/23-  15 seconds     Time  4    Period  Weeks    Status  On-going      PT LONG TERM GOAL #3   Title  Pt will perform TUG in less than 12 sec, with LRAD to indicate he is at a decreased risk of falling in the community.     Baseline  10/23- 14 seconds     Time  4    Period  Weeks    Status  On-going      PT LONG TERM GOAL #4   Title  Pt will perform SLS on each LE up to 10 sec without LOB, 3/5 trials, to decrease his risk of falling during ambulation/stair negotiation.     Baseline  10/23- 1-3 seconds     Time  4    Period  Weeks    Status  On-going            Plan - 08/31/17 1243    Clinical Impression Statement  Continued session focus iwht balance training.  Pt improved static position with tandem stance, still continues to require assistance for safety with dynamic surfaces and movements.  Reviewed importance of posture to assist, pt given theraband and printout to add to HEP.      Clinical Impairments Affecting Rehab Potential  (+) motivated to participate, moderate success with last course of PT; (-) chronicity of impairment, recurrence of impairment     PT Frequency  1x / week    PT Duration  4 weeks  PT Treatment/Interventions  ADLs/Self Care Home Management;Cryotherapy;DME Instruction;Gait training;Stair training;Functional mobility training;Therapeutic activities;Therapeutic exercise;Balance training;Neuromuscular re-education;Patient/family education;Electrical Stimulation;Iontophoresis 37m/ml Dexamethasone;Moist Heat;Orthotic Fit/Training;Manual techniques    PT Next Visit Plan  update HEP each session. Continue balance  focus, strengthening as tolerated.     PT Home Exercise Plan  Eval: continue HHPT HEP and old HEP from this clinic; add heel raises, standing HS curls, eccentric stand to sit, balance with one foot forward 10/23: HS stretch, side stepping with green TB: SLS and RTB for postural strengthening       Patient will benefit from skilled therapeutic intervention in order to improve the following deficits and impairments:  Abnormal gait, Decreased coordination, Decreased mobility, Postural dysfunction, Decreased strength, Decreased balance, Difficulty walking, Decreased safety awareness, Impaired flexibility  Visit Diagnosis: Unsteadiness on feet  Muscle weakness (generalized)  Other abnormalities of gait and mobility     Problem List Patient Active Problem List   Diagnosis Date Noted  . Av[place] necrosis of bone of right hip ([place] 02/23/2017  . Primary osteoarthritis involving multiple joints 02/21/2017  . Smoker 02/21/2017  . Macular degeneration 11/10/2016  . Insomnia 07/20/2016  . Restless legs 07/20/2016  . Spinal stenosis in cervical region 10/14/2015  . Senile purp[place] 04/18/2015  . Osteopenia 01/28/2015  . Spinal stenosis of lumbar region 01/20/2015  . BPH (benign prostatic hyperplasia) 01/20/2015  . Diverticulosis of colon without hemorrhage   . Encounter for screening colonoscopy 11/21/2014  . HTN (hypertension), benign 04/17/2014  . Temporal arteritis ([facility] 04/17/2014   CIhor Austin [place] CLet[place] CAldona Lento11/04/2017, 12:54 PM  [place] [address]Phone: [phone]  Fax:  [phone] Name: Gerald ALCOCKMRN: [id number] of Birth: 1April 25, 1936

## 2017-09-07 ENCOUNTER — Encounter (HOSPITAL_COMMUNITY): Payer: Self-pay | Admitting: Physical Therapy

## 2017-09-07 ENCOUNTER — Ambulatory Visit (HOSPITAL_COMMUNITY): Payer: Medicare Other | Admitting: Physical Therapy

## 2017-09-07 DIAGNOSIS — M6281 Muscle weakness (generalized): Secondary | ICD-10-CM

## 2017-09-07 DIAGNOSIS — R2689 Other abnormalities of gait and mobility: Secondary | ICD-10-CM

## 2017-09-07 DIAGNOSIS — R2681 Unsteadiness on feet: Secondary | ICD-10-CM

## 2017-09-07 NOTE — Therapy (Signed)
[place], [place], [address] Phone: [phone]   Fax:  [phone]  Physical Therapy Treatment  Patient Details  Name: Gerald Hall MRN: [id number] Date of Birth: 28-Mar-1935 Referring Provider: Sallee Lange    Encounter Date: 09/07/2017  PT End of Session - 09/07/17 1330    Visit Number  17    Number of Visits  18    Date for PT Re-Evaluation  09/13/17    Authorization Type  Medicare and [employer]; GCode complete 14th session     Authorization Time Period  06/22/17 to 4/65/03; new cert 5/46 to 56/81; newest cert 27/51 to 70/01    Authorization - Visit Number  17    Authorization - Number of Visits  24    PT Start Time  7494    PT Stop Time  1114    PT Time Calculation (min)  43 min    Equipment Utilized During Treatment  Gait belt    Activity Tolerance  Patient tolerated treatment well;No increased pain;Patient limited by fatigue    Behavior During Therapy  [facility] for tasks assessed/performed       Past Medical History:  Diagnosis Date  . Arthritis   . Balance problem    uses a walker  . Blood in urine    Sees urology yearly  . Deafness in right ear age 5  . Hyperlipidemia   . Hypertension   . Temporal arteritis ([place]) 2016  . Urinary incontinence     Past Surgical History:  Procedure Laterality Date  . BACK SURGERY  2010  . CATARACT EXTRACTION Bilateral 07/30/14, 08/13/2014  . CERVICAL SPINE SURGERY  09/01/15   C 4-5 , [place]  . COLONOSCOPY  12/19/2007   RMR: 1. External hemorrohids, otherwise normal rectum.2. Normal colon. 3. Normal terminal ileum.  Marland Kitchen KNEE SURGERY Left    around 2000, arthroscopic    There were no vitals filed for this visit.  Subjective Assessment - 09/07/17 1039    Subjective  patient arrives stating that he is doing well, no major changes since last time. No falls.     Pertinent History  HTN, BPH, hx lumbar and cervical stenosis, macular  degeneration, has used walker for 2 years, history of cervical/lumbar/L knee surgeries, R anterior hip 2018.    Currently in Pain?  Yes    Pain Score  3     Pain Location  Knee    Pain Orientation  Right;Left          [facility] Adult PT Treatment/Exercise - 09/07/17 0001      Knee/Hip Exercises: Standing   Forward Lunges  Both;2 sets;10 reps    Forward Lunges Limitations  bosu     Lateral Step Up  Both;Step Height: 4" 2x12     Step Down  Both;2 sets;Step Height: 4" 2 sets of 12       Knee/Hip Exercises: Supine   Bridges  Both;2 sets;10 reps red TB around knees         Balance Exercises - 09/07/17 1042      Balance Exercises: Standing   Tandem Stance  Eyes open;Foam/compliant surface;2 reps;30 secs    Other Standing Exercises  cone taps in single leg stace 3 cone combo, solid  surface; semitandem stance with externl perturbations on foam pad;          PT Education - 09/07/17 1329    Education provided  Yes  Education Details  form and safety with exercises, update on HEP    Person(s) Educated  Patient    Methods  Explanation;Demonstration    Comprehension  Verbalized understanding;Returned demonstration       PT Short Term Goals - 08/16/17 1456      PT SHORT TERM GOAL #1   Title  Pt will demo consistency and independence with his HEP to improve strength and mobility.     Baseline  10/23- compliant     Time  2    Period  Weeks    Status  Achieved      PT SHORT TERM GOAL #2   Title  Pt will demo resolutation of hamstring length/flexibility impairments B in order to improve posture and gait     Baseline  10/23- remain somewhat limited     Time  2    Period  Weeks    Status  On-going      PT SHORT TERM GOAL #3   Title  Patient to be able to perform sit to stand without UEs and without multiple attempts and no unsteadiness upon reaching standing in order to demonstrate improved functional mobility     Baseline  10/23- improved     Time  2    Period  Weeks     Status  Partially Met        PT Long Term Goals - 08/16/17 1458      PT LONG TERM GOAL #1   Title  Pt will demo strength as having improved by at least 1 MMT grade in all tested groups in order to improve balance and functional activity     Baseline  10/23- no significant change     Time  4    Period  Weeks    Status  Achieved      PT LONG TERM GOAL #2   Title  Pt will perform 5x sit to stand without UE support in less than 12 sec, to improve his safety/independence getting in and out of chairs at home.     Baseline  10/23-  15 seconds     Time  4    Period  Weeks    Status  On-going      PT LONG TERM GOAL #3   Title  Pt will perform TUG in less than 12 sec, with LRAD to indicate he is at a decreased risk of falling in the community.     Baseline  10/23- 14 seconds     Time  4    Period  Weeks    Status  On-going      PT LONG TERM GOAL #4   Title  Pt will perform SLS on each LE up to 10 sec without LOB, 3/5 trials, to decrease his risk of falling during ambulation/stair negotiation.     Baseline  10/23- 1-3 seconds     Time  4    Period  Weeks    Status  On-going            Plan - 09/07/17 1330    Clinical Impression Statement  Patient continues to progress in therapy. Session focused today on eccentric strengthening and balance training. He continues to require external assistance to maintain balacne on dynamic surfaces. PT reviewed HEP and updated exercises to target specific weakness of glut med. Reviewed the importance of HEP and regular fitness program and discussed upcoming reassessment for goals and measures next session.    Clinical Impairments  Affecting Rehab Potential  (+) motivated to participate, moderate success with last course of PT; (-) chronicity of impairment, recurrence of impairment     PT Frequency  1x / week    PT Duration  4 weeks    PT Treatment/Interventions  ADLs/Self Care Home Management;Cryotherapy;DME Instruction;Gait training;Stair  training;Functional mobility training;Therapeutic activities;Therapeutic exercise;Balance training;Neuromuscular re-education;Patient/family education;Electrical Stimulation;Iontophoresis 90m/ml Dexamethasone;Moist Heat;Orthotic Fit/Training;Manual techniques    PT Next Visit Plan  Re-assess goals and test/measures. Determine need for further services or provide resources for HEP and fitness program if discharge is appropriate.Uupdate HEP each session. Continue balance focus, strengthening as tolerated.     PT Home Exercise Plan  Eval: continue HHPT HEP and old HEP from this clinic; add heel raises, standing HS curls, eccentric stand to sit, balance with one foot forward 10/23: HS stretch, side stepping with green TB: SLS and RTB for postural strengthening. Added theraband (green) to bridges for hip abductors.     Consulted and Agree with Plan of Care  Patient       Patient will benefit from skilled therapeutic intervention in order to improve the following deficits and impairments:  Abnormal gait, Decreased coordination, Decreased mobility, Postural dysfunction, Decreased strength, Decreased balance, Difficulty walking, Decreased safety awareness, Impaired flexibility  Visit Diagnosis: Unsteadiness on feet  Muscle weakness (generalized)  Other abnormalities of gait and mobility     Problem List Patient Active Problem List   Diagnosis Date Noted  . Avascular necrosis of bone of right hip ([place] 02/23/2017  . Primary osteoarthritis involving multiple joints 02/21/2017  . Smoker 02/21/2017  . Macular degeneration 11/10/2016  . Insomnia 07/20/2016  . Restless legs 07/20/2016  . Spinal stenosis in cervical region 10/14/2015  . Senile purpura ([place] 04/18/2015  . Osteopenia 01/28/2015  . Spinal stenosis of lumbar region 01/20/2015  . BPH (benign prostatic hyperplasia) 01/20/2015  . Diverticulosis of colon without hemorrhage   . Encounter for screening colonoscopy 11/21/2014  . HTN  (hypertension), benign 04/17/2014  . Temporal arteritis ([place] 04/17/2014    RDebara Pickett PT, DPT Physical Therapist with [facility] 09/07/2017 1:45 PM     [place] [address]Phone: [phone]  Fax:  [phone] Name: Gerald HODKINSONMRN: [id number] of Birth: [birth year]

## 2017-09-13 ENCOUNTER — Other Ambulatory Visit: Payer: Self-pay

## 2017-09-13 ENCOUNTER — Encounter (HOSPITAL_COMMUNITY): Payer: Self-pay | Admitting: Physical Therapy

## 2017-09-13 ENCOUNTER — Ambulatory Visit (HOSPITAL_COMMUNITY): Payer: Medicare Other | Admitting: Physical Therapy

## 2017-09-13 DIAGNOSIS — M6281 Muscle weakness (generalized): Secondary | ICD-10-CM | POA: Diagnosis not present

## 2017-09-13 DIAGNOSIS — R2681 Unsteadiness on feet: Secondary | ICD-10-CM

## 2017-09-13 DIAGNOSIS — R2689 Other abnormalities of gait and mobility: Secondary | ICD-10-CM

## 2017-09-13 NOTE — Therapy (Signed)
[facility] [place], [place], [address] Phone: [phone]   Fax:  [phone]  Physical Therapy Re-Assessment & Discharge Summary  Patient Details  Name: Gerald Hall MRN: [id number] Date of Birth: [birth year] Referring Provider: Sallee Lange   Encounter Date: 09/13/2017  PT End of Session - 09/13/17 1113    Visit Number  18    Number of Visits  18    Date for PT Re-Evaluation  09/13/17    Authorization Type  Medicare and [employer]; GCode complete 14th session     Authorization Time Period  06/22/17 to 9/41/74; new cert 0/81 to 44/81; newest cert 85/63 to 14/97    Authorization - Visit Number  18    Authorization - Number of Visits  24    PT Start Time  1115    PT Stop Time  1141    PT Time Calculation (min)  26 min    Equipment Utilized During Treatment  --    Activity Tolerance  Patient tolerated treatment well;No increased pain    Behavior During Therapy  WFL for tasks assessed/performed       Past Medical History:  Diagnosis Date  . Arthritis   . Balance problem    uses a walker  . Blood in urine    Sees urology yearly  . Deafness in right ear age 15  . Hyperlipidemia   . Hypertension   . Temporal arteritis ([place]) 2016  . Urinary incontinence     Past Surgical History:  Procedure Laterality Date  . BACK SURGERY  2010  . CATARACT EXTRACTION Bilateral 07/30/14, 08/13/2014  . CERVICAL SPINE SURGERY  09/01/15   C 4-5 , Ken Caryl  . COLONOSCOPY  12/19/2007   RMR: 1. External hemorrohids, otherwise normal rectum.2. Normal colon. 3. Normal terminal ileum.  . COLONOSCOPY N/A 12/13/2014   Procedure: COLONOSCOPY;  Surgeon: Daneil Dolin, MD;  Location: [facility];  Service: Endoscopy;  Laterality: N/A;  11:15 Pt Request Time  . KNEE SURGERY Left    around 2000, arthroscopic  . TOTAL HIP ARTHROPLASTY Right 02/23/2017   Procedure: TOTAL HIP ARTHROPLASTY ANTERIOR APPROACH;  Surgeon: Hessie Knows, MD;  Location: [facility] ORS;  Service: Orthopedics;  Laterality: Right;    There were no vitals filed for this visit.  Subjective Assessment - 09/13/17 1118    Subjective  Patient arrives stating he is feeling pretty good. He has not fallen and has been participating in his HEP daily. He denies any back pain today and states his pain is in his knees.    Pertinent History  HTN, BPH, hx lumbar and cervical stenosis, macular degeneration, has used walker for 2 years, history of cervical/lumbar/L knee surgeries, R anterior hip 2018.    How long can you sit comfortably?  unlimited    How long can you stand comfortably?  10-15 minutes because of knees    How long can you walk comfortably?  10-15 minutes because of knees    Patient Stated Goals  improve balance and strength     Currently in Pain?  Yes    Pain Score  3     Pain Location  Knee    Pain Orientation  Right;Left    Pain Descriptors / Indicators  Aching    Pain Type  Chronic pain    Pain Onset  More than a month ago    Pain Frequency  Intermittent    Aggravating  Factors   standing walking squating    Pain Relieving Factors  sitting    Effect of Pain on Daily Activities  increased difficulty       [facility] PT Assessment - 09/13/17 0001      Assessment   Medical Diagnosis  balance, restless leg     Referring Provider  [name]    Onset Date/Surgical Date  -- anterior hip in may 2018, chronic balance deficits    Next MD Visit  Dr. Wolfgang Phoenix in 4-6 months     Prior Therapy  PT here last year       Precautions   Precautions  Anterior Hip May 2018    Precaution Booklet Issued  No    Precaution Comments  Rt       Restrictions   Weight Bearing Restrictions  No      Balance Screen   Has the patient fallen in the past 6 months  No      Prior Function   Level of Independence  Independent;Independent with basic ADLs;Independent with gait;Independent with transfers    Vocation  Retired      Strength   Right Hip Flexion  4+/5     Right Hip ABduction  4/5    Left Hip Flexion  4+/5    Left Hip ABduction  4/5    Right Knee Flexion  5/5    Right Knee Extension  4+/5    Left Knee Flexion  5/5    Left Knee Extension  4/5    Right Ankle Dorsiflexion  5/5    Left Ankle Dorsiflexion  4+/5      Flexibility   Soft Tissue Assessment /Muscle Length  yes    Hamstrings  mild limition hamstring R and Lt.       Transfers   Five time sit to stand comments   11.66      Standardized Balance Assessment   Five times sit to stand comments   11.66      Timed Up and Go Test   Normal TUG (seconds)  11.35      High Level Balance   High Level Balance Comments  SLS on BLE < 2 seconds        PT Education - 09/13/17 1148    Education provided  Yes    Education Details  Reviewed progress towards goals and HEP, addressed any questions/concerns, discussed importance of compliance iwth HEP to maintain strength and educated on benefits of joining community rec center or [employer] for group exercises/balacnce classes and [employer].    Person(s) Educated  Patient    Methods  Explanation    Comprehension  Verbalized understanding       PT Short Term Goals - 09/13/17 1154      PT SHORT TERM GOAL #1   Title  Pt will demo consistency and independence with his HEP to improve strength and mobility.     Baseline  10/23- compliant     Time  2    Period  Weeks    Status  Achieved      PT SHORT TERM GOAL #2   Title  Pt will demo resolutation of hamstring length/flexibility impairments B in order to improve posture and gait     Baseline  10/23- remain somewhat limited; 09/13/17 - mild limitation in BLE, discussed importance of continuing hamstring stretch    Time  2    Period  Weeks    Status  On-going  PT SHORT TERM GOAL #3   Title  Patient to be able to perform sit to stand without UEs and without multiple attempts and no unsteadiness upon reaching standing in order to demonstrate improved functional mobility      Baseline  10/23- improved; 09/13/17 - patient performed 5x S<>S with improved speed and steadiness    Time  2    Period  Weeks    Status  Achieved        PT Long Term Goals - 09/13/17 1156      PT LONG TERM GOAL #1   Title  Pt will demo strength as having improved by at least 1 MMT grade in all tested groups in order to improve balance and functional activity     Baseline  10/23- no significant change; 09/13/17 - patient improved MMT by 1/2 point or more for muscle groups tested    Time  4    Period  Weeks    Status  Partially Met      PT LONG TERM GOAL #2   Title  Pt will perform 5x sit to stand without UE support in less than 12 sec, to improve his safety/independence getting in and out of chairs at home.     Baseline  10/23-  15 seconds; 09/13/17 - patient performed with no UE support in 11.66 seconds    Time  4    Period  Weeks    Status  Achieved      PT LONG TERM GOAL #3   Title  Pt will perform TUG in less than 12 sec, with LRAD to indicate he is at a decreased risk of falling in the community.     Baseline  10/23- 14 seconds; 09/13/17 - patietn performed in 11.35 seconds with rollator    Time  4    Period  Weeks    Status  Achieved      PT LONG TERM GOAL #4   Title  Pt will perform SLS on each LE up to 10 sec without LOB, 3/5 trials, to decrease his risk of falling during ambulation/stair negotiation.     Baseline  10/23- 1-3 seconds; 09/13/17 - patient continues to be unable to perform SLS on BLE for 1-3 seconds. Patient reports he has not been able to do this for several years.    Time  4    Period  Weeks    Status  On-going         Plan - 09/13/17 1113    Clinical Impression Statement  Patient has made excellent progress with therapy and has met 3/3 STG's and 3/4 LTG's. He has improved his TUG and 5x Sit<>Stand measures to less than 12 seconds placing him at a decreased risk of falling. After revieweing goalls and progtress with patient he feels he has improved  enough since the start of tehrapy to manage with HEP and tools provided by therapist. He was educated on benefit of jiopnign local fitness facility for regular exercise and showed interest in that. Patient will be discharged from PT at this time.    Clinical Impairments Affecting Rehab Potential  (+) motivated to participate, moderate success with last course of PT; (-) chronicity of impairment, recurrence of impairment     PT Frequency  1x / week    PT Duration  4 weeks    PT Treatment/Interventions  ADLs/Self Care Home Management;Cryotherapy;DME Instruction;Gait training;Stair training;Functional mobility training;Therapeutic activities;Therapeutic exercise;Balance training;Neuromuscular re-education;Patient/family education;Electrical Stimulation;Iontophoresis 102m/ml Dexamethasone;Moist Heat;Orthotic Fit/Training;Manual techniques  PT Next Visit Plan  Patient being discharged with HEP and education on benefits of local fitness programs.    PT Home Exercise Plan  Eval: continue HHPT HEP and old HEP from this clinic; add heel raises, standing HS curls, eccentric stand to sit, balance with one foot forward 10/23: HS stretch, side stepping with green TB: SLS and RTB for postural strengthening. Added theraband (green) to bridges for hip abductors.     Consulted and Agree with Plan of Care  Patient       Patient will benefit from skilled therapeutic intervention in order to improve the following deficits and impairments:  Abnormal gait, Decreased coordination, Decreased mobility, Postural dysfunction, Decreased strength, Decreased balance, Difficulty walking, Decreased safety awareness, Impaired flexibility  Visit Diagnosis: Unsteadiness on feet  Muscle weakness (generalized)  Other abnormalities of gait and mobility     Problem List Patient Active Problem List   Diagnosis Date Noted  . Avascular necrosis of bone of right hip ([place]) 02/23/2017  . Primary osteoarthritis involving multiple  joints 02/21/2017  . Smoker 02/21/2017  . Macular degeneration 11/10/2016  . Insomnia 07/20/2016  . Restless legs 07/20/2016  . Spinal stenosis in cervical region 10/14/2015  . Senile purpura ([place]) 04/18/2015  . Osteopenia 01/28/2015  . Spinal stenosis of lumbar region 01/20/2015  . BPH (benign prostatic hyperplasia) 01/20/2015  . Diverticulosis of colon without hemorrhage   . Encounter for screening colonoscopy 11/21/2014  . HTN (hypertension), benign 04/17/2014  . Temporal arteritis ([place]) 04/17/2014     PHYSICAL THERAPY DISCHARGE SUMMARY  Visits from Start of Care: 18  Current functional level related to goals / functional outcomes: See above details in note.   Remaining deficits: See above details in note.    Education / Equipment: Reviewed progress towards goals and HEP, addressed any questions/concerns, discussed importance of compliance iwth HEP to maintain strength and educated on benefits of joining community rec center or [employer] for group exercises/balacnce classes and [employer].   Plan: Patient agrees to discharge.  Patient goals were partially met. Patient is being discharged due to meeting the stated rehab goals.  ?????Patient stated he feels pleased with current functional level and feels he can manage on his own at home.       Debara Pickett, PT, DPT Physical Therapist with [facility]  09/13/2017 12:02 PM    [facility] [place], [place], [address] Phone: [phone]   Fax:  [phone]  Name: Gerald Hall MRN: [id number] Date of Birth: [birth year]

## 2017-09-21 DIAGNOSIS — Z85828 Personal history of other malignant neoplasm of skin: Secondary | ICD-10-CM | POA: Diagnosis not present

## 2017-09-21 DIAGNOSIS — L578 Other skin changes due to chronic exposure to nonionizing radiation: Secondary | ICD-10-CM | POA: Diagnosis not present

## 2017-09-21 DIAGNOSIS — L570 Actinic keratosis: Secondary | ICD-10-CM | POA: Diagnosis not present

## 2017-09-21 DIAGNOSIS — Z859 Personal history of malignant neoplasm, unspecified: Secondary | ICD-10-CM | POA: Diagnosis not present

## 2017-09-21 DIAGNOSIS — Z872 Personal history of diseases of the skin and subcutaneous tissue: Secondary | ICD-10-CM | POA: Diagnosis not present

## 2017-09-21 DIAGNOSIS — L281 Prurigo nodularis: Secondary | ICD-10-CM | POA: Diagnosis not present

## 2017-09-23 DIAGNOSIS — B351 Tinea unguium: Secondary | ICD-10-CM | POA: Diagnosis not present

## 2017-09-23 DIAGNOSIS — M79674 Pain in right toe(s): Secondary | ICD-10-CM | POA: Diagnosis not present

## 2017-09-26 ENCOUNTER — Other Ambulatory Visit: Payer: Self-pay | Admitting: Family Medicine

## 2017-10-26 ENCOUNTER — Other Ambulatory Visit: Payer: Self-pay | Admitting: Family Medicine

## 2017-11-01 ENCOUNTER — Encounter: Payer: Self-pay | Admitting: Family Medicine

## 2017-11-01 ENCOUNTER — Ambulatory Visit (INDEPENDENT_AMBULATORY_CARE_PROVIDER_SITE_OTHER): Payer: Medicare Other | Admitting: Family Medicine

## 2017-11-01 VITALS — BP 112/88 | HR 76 | Temp 98.1°F | Ht 69.0 in | Wt 187.1 lb

## 2017-11-01 DIAGNOSIS — R0602 Shortness of breath: Secondary | ICD-10-CM | POA: Diagnosis not present

## 2017-11-01 DIAGNOSIS — D72829 Elevated white blood cell count, unspecified: Secondary | ICD-10-CM

## 2017-11-01 DIAGNOSIS — E875 Hyperkalemia: Secondary | ICD-10-CM | POA: Diagnosis not present

## 2017-11-01 DIAGNOSIS — E785 Hyperlipidemia, unspecified: Secondary | ICD-10-CM | POA: Diagnosis not present

## 2017-11-01 MED ORDER — BUDESONIDE-FORMOTEROL FUMARATE 80-4.5 MCG/ACT IN AERO: 2.0000 | INHALATION_SPRAY | Freq: Two times a day (BID) | RESPIRATORY_TRACT | 3 refills | Status: DC

## 2017-11-01 NOTE — Progress Notes (Signed)
   Subjective:    Patient ID: Gerald Hall, male    DOB: Dec 23, 1934, 83 y.o.   MRN: [id number]  HPI  Patient is here today with complaints of shortness of breath that has gotten progressively worse over the last few weeks. He states he has gained some weight and has started going to the gym trying to work it off. States he had a panic attack last week,he does not know exactly what brought it on,but suspects the shortness of breath. PMH-history of smoking GAD 7 : Generalized Anxiety Score 11/01/2017  Nervous, Anxious, on Edge 1  Control/stop worrying 0  Worry too much - different things 0  Trouble relaxing 0  Restless 0  Easily annoyed or irritable 0  Afraid - awful might happen 0  Total GAD 7 Score 1  Anxiety Difficulty Not difficult at all    Patient denies being anxious denies being depressed States his shortness of breath comes and goes one time it hit him at night and it did trigger a panic attack he sat up he breathes better and then he took lorazepam and that helped He does get a little short winded when he pushes himself most of the time he walks slow because of orthopedic neurologic conditions.  He denies any chest tightness pressure pain or shortness of breath with activity Review of Systems    Please see above denies cough hemoptysis denies abdominal pain denies weight loss states appetite good not suicidal Objective:   Physical Exam Neck no masses lungs are clear no crackles heart is regular pulse normal BP good extremities no edema      Assessment & Plan:  Shortness of breath more than likely COPD I recommend chest x-ray and pulmonary function tests he will also do lab work before his follow-up in 6-8 weeks Start Symbicort 2 puffs twice daily will follow up patient in 6-8 weeks If he has any side effects or problems he is to call us If we see progressive troubles with shortness of breath and if his chest x-ray and pulmonary function does not explain at next step would  be cardiac workup including echo lab work possible cardiology visit

## 2017-11-01 NOTE — Patient Instructions (Signed)
Do labs start of March

## 2017-11-22 ENCOUNTER — Ambulatory Visit (HOSPITAL_COMMUNITY)
Admission: RE | Admit: 2017-11-22 | Discharge: 2017-11-22 | Disposition: A | Discharge status: Home / Self Care | Payer: Medicare Other | Source: Ambulatory Visit | Attending: Family Medicine | Admitting: Family Medicine

## 2017-11-22 DIAGNOSIS — R0602 Shortness of breath: Secondary | ICD-10-CM

## 2017-11-22 LAB — PULMONARY FUNCTION TEST
DL/VA % PRED: 61 %
DL/VA: 2.79 ml/min/mmHg/L
DLCO UNC: 15.5 ml/min/mmHg
DLCO unc % pred: 50 %
FEF 25-75 POST: 1.93 L/s
FEF 25-75 Pre: 1.74 L/sec
FEF2575-%Change-Post: 10 %
FEF2575-%PRED-PRE: 93 %
FEF2575-%Pred-Post: 103 %
FEV1-%Change-Post: 1 %
FEV1-%Pred-Post: 93 %
FEV1-%Pred-Pre: 92 %
FEV1-Post: 2.56 L
FEV1-Pre: 2.52 L
FEV1FVC-%Change-Post: -1 %
FEV1FVC-%PRED-PRE: 103 %
FEV6-%Change-Post: 3 %
FEV6-%Pred-Post: 96 %
FEV6-%Pred-Pre: 92 %
FEV6-POST: 3.45 L
FEV6-Pre: 3.32 L
FEV6FVC-%Change-Post: 0 %
FEV6FVC-%PRED-POST: 105 %
FEV6FVC-%Pred-Pre: 104 %
FVC-%Change-Post: 3 %
FVC-%PRED-PRE: 88 %
FVC-%Pred-Post: 91 %
FVC-POST: 3.54 L
FVC-PRE: 3.43 L
PRE FEV1/FVC RATIO: 74 %
Post FEV1/FVC ratio: 72 %
Post FEV6/FVC ratio: 98 %
Pre FEV6/FVC Ratio: 97 %
RV % pred: 108 %
RV: 2.83 L
TLC % PRED: 93 %
TLC: 6.39 L

## 2017-11-22 MED ORDER — ALBUTEROL SULFATE (2.5 MG/3ML) 0.083% IN NEBU
2.5000 mg | INHALATION_SOLUTION | Freq: Once | RESPIRATORY_TRACT | Status: AC
  Administered 2017-11-22: 2.5 mg via RESPIRATORY_TRACT

## 2017-11-26 ENCOUNTER — Other Ambulatory Visit: Payer: Self-pay | Admitting: Family Medicine

## 2017-12-16 DIAGNOSIS — B351 Tinea unguium: Secondary | ICD-10-CM | POA: Diagnosis not present

## 2017-12-16 DIAGNOSIS — M79674 Pain in right toe(s): Secondary | ICD-10-CM | POA: Diagnosis not present

## 2017-12-24 ENCOUNTER — Other Ambulatory Visit: Payer: Self-pay | Admitting: Family Medicine

## 2017-12-29 DIAGNOSIS — E785 Hyperlipidemia, unspecified: Secondary | ICD-10-CM | POA: Diagnosis not present

## 2017-12-29 DIAGNOSIS — E875 Hyperkalemia: Secondary | ICD-10-CM | POA: Diagnosis not present

## 2017-12-29 DIAGNOSIS — D72829 Elevated white blood cell count, unspecified: Secondary | ICD-10-CM | POA: Diagnosis not present

## 2017-12-30 LAB — LIPID PANEL
Chol/HDL Ratio: 4.1 ratio (ref 0.0–5.0)
Cholesterol, Total: 165 mg/dL (ref 100–199)
HDL: 40 mg/dL (ref >39)
LDL CALC: 102 mg/dL — AB (ref 0–99)
TRIGLYCERIDES: 115 mg/dL (ref 0–149)
VLDL Cholesterol Cal: 23 mg/dL (ref 5–40)

## 2017-12-30 LAB — BASIC METABOLIC PANEL
BUN / CREAT RATIO: 9 — AB (ref 10–24)
BUN: 12 mg/dL (ref 8–27)
CHLORIDE: 97 mmol/L (ref 96–106)
CO2: 27 mmol/L (ref 20–29)
CREATININE: 1.32 mg/dL — AB (ref 0.76–1.27)
Calcium: 9.7 mg/dL (ref 8.6–10.2)
GFR calc Af Amer: 58 mL/min/{1.73_m2} — ABNORMAL LOW (ref >59)
GFR calc non Af Amer: 51 mL/min/{1.73_m2} — ABNORMAL LOW (ref >59)
GLUCOSE: 93 mg/dL (ref 65–99)
Potassium: 3.6 mmol/L (ref 3.5–5.2)
SODIUM: 141 mmol/L (ref 134–144)

## 2017-12-30 LAB — CBC WITH DIFFERENTIAL/PLATELET
BASOS ABS: 0 10*3/uL (ref 0.0–0.2)
Basos: 0 %
EOS (ABSOLUTE): 0.2 10*3/uL (ref 0.0–0.4)
Eos: 2 %
Hematocrit: 49.1 % (ref 37.5–51.0)
Hemoglobin: 16.8 g/dL (ref 13.0–17.7)
IMMATURE GRANS (ABS): 0 10*3/uL (ref 0.0–0.1)
Immature Granulocytes: 0 %
LYMPHS ABS: 2.5 10*3/uL (ref 0.7–3.1)
LYMPHS: 27 %
MCH: 31.1 pg (ref 26.6–33.0)
MCHC: 34.2 g/dL (ref 31.5–35.7)
MCV: 91 fL (ref 79–97)
Monocytes Absolute: 1.3 10*3/uL — ABNORMAL HIGH (ref 0.1–0.9)
Monocytes: 13 %
NEUTROS ABS: 5.6 10*3/uL (ref 1.4–7.0)
Neutrophils: 58 %
PLATELETS: 233 10*3/uL (ref 150–379)
RBC: 5.41 x10E6/uL (ref 4.14–5.80)
RDW: 14.4 % (ref 12.3–15.4)
WBC: 9.6 10*3/uL (ref 3.4–10.8)

## 2018-01-04 ENCOUNTER — Ambulatory Visit: Payer: Medicare Other | Admitting: Family Medicine

## 2018-01-06 ENCOUNTER — Ambulatory Visit (INDEPENDENT_AMBULATORY_CARE_PROVIDER_SITE_OTHER): Payer: Medicare Other | Admitting: Family Medicine

## 2018-01-06 ENCOUNTER — Encounter: Payer: Self-pay | Admitting: Family Medicine

## 2018-01-06 VITALS — BP 110/78 | Ht 69.0 in | Wt 189.9 lb

## 2018-01-06 DIAGNOSIS — N289 Disorder of kidney and ureter, unspecified: Secondary | ICD-10-CM | POA: Diagnosis not present

## 2018-01-06 DIAGNOSIS — M79671 Pain in right foot: Secondary | ICD-10-CM | POA: Diagnosis not present

## 2018-01-06 DIAGNOSIS — I10 Essential (primary) hypertension: Secondary | ICD-10-CM | POA: Diagnosis not present

## 2018-01-06 MED ORDER — ALBUTEROL SULFATE HFA 108 (90 BASE) MCG/ACT IN AERS: 2.0000 | INHALATION_SPRAY | RESPIRATORY_TRACT | 2 refills | Status: DC | PRN

## 2018-01-06 NOTE — Progress Notes (Signed)
Subjective:    Patient ID: Gerald Hall, male    DOB: [birth year], 83 y.o.   MRN: [id number]  Hypertension  This is a chronic problem. The current episode started more than 1 year ago. Pertinent negatives include no chest pain, headaches or shortness of breath. Risk factors for coronary artery disease include sedentary lifestyle and male gender. There are no compliance problems.   Patient states at times he is having some memory dysfunction but it is not severe.  He has help was read in sales.  All of all of his needs are met denies being depressed States the Symbicort is helping to some degree with his breathing but not as much as he would like but at the same time he is realistic He did have pulmonary function test did not look bad He does not want to see a pulmonologist He is willing to try albuterol.  Results for orders placed or performed during the hospital encounter of 11/22/17  Pulmonary function test  Result Value Ref Range   FVC-Pre 3.43 L   FVC-%Pred-Pre 88 %   FVC-Post 3.54 L   FVC-%Pred-Post 91 %   FVC-%Change-Post 3 %   FEV1-Pre 2.52 L   FEV1-%Pred-Pre 92 %   FEV1-Post 2.56 L   FEV1-%Pred-Post 93 %   FEV1-%Change-Post 1 %   FEV6-Pre 3.32 L   FEV6-%Pred-Pre 92 %   FEV6-Post 3.45 L   FEV6-%Pred-Post 96 %   FEV6-%Change-Post 3 %   Pre FEV1/FVC ratio 74 %   FEV1FVC-%Pred-Pre 103 %   Post FEV1/FVC ratio 72 %   FEV1FVC-%Change-Post -1 %   Pre FEV6/FVC Ratio 97 %   FEV6FVC-%Pred-Pre 104 %   Post FEV6/FVC ratio 98 %   FEV6FVC-%Pred-Post 105 %   FEV6FVC-%Change-Post 0 %   FEF 25-75 Pre 1.74 L/sec   FEF2575-%Pred-Pre 93 %   FEF 25-75 Post 1.93 L/sec   FEF2575-%Pred-Post 103 %   FEF2575-%Change-Post 10 %   RV 2.83 L   RV % pred 108 %   TLC 6.39 L   TLC % pred 93 %   DLCO unc 15.50 ml/min/mmHg   DLCO unc % pred 50 %   DL/VA 2.79 ml/min/mmHg/L   DL/VA % pred 61 %     Review of Systems  Constitutional: Negative for activity change, appetite change and  fatigue.  HENT: Negative for congestion and rhinorrhea.   Respiratory: Negative for cough, chest tightness and shortness of breath.   Cardiovascular: Negative for chest pain and leg swelling.  Gastrointestinal: Negative for abdominal pain, diarrhea and nausea.  Endocrine: Negative for polydipsia and polyphagia.  Genitourinary: Negative for dysuria and hematuria.  Neurological: Negative for weakness and headaches.  Psychiatric/Behavioral: Negative for confusion and dysphoric mood.       Objective:   Physical Exam  Constitutional: He appears well-nourished. No distress.  HENT:  Head: Normocephalic and atraumatic.  Eyes: Right eye exhibits no discharge. Left eye exhibits no discharge.  Neck: No tracheal deviation present.  Cardiovascular: Normal rate, regular rhythm and normal heart sounds.  No murmur heard. Pulmonary/Chest: Effort normal and breath sounds normal. No respiratory distress. He has no wheezes.  Musculoskeletal: He exhibits no edema.  Lymphadenopathy:    He has no cervical adenopathy.  Neurological: He is alert.  Skin: Skin is warm. No rash noted.  Psychiatric: His behavior is normal.  Vitals reviewed.         Assessment & Plan:  Mild forgetfulness-patient does not want detailed testing at this time  Lung related issues probably related to all his history of smoking use albuterol.  Continue Symbicort patient does not want to see pulmonologist Blood pressure good control continue current measures Follow-up in 4 months renal insufficiency recheck metabolic 7 nonfasting  Intermittent right foot pain probably related to osteoarthritis but patient requesting uric acid level

## 2018-01-07 LAB — BASIC METABOLIC PANEL
BUN/Creatinine Ratio: 12 (ref 10–24)
BUN: 17 mg/dL (ref 8–27)
CHLORIDE: 96 mmol/L (ref 96–106)
CO2: 25 mmol/L (ref 20–29)
Calcium: 10.1 mg/dL (ref 8.6–10.2)
Creatinine, Ser: 1.44 mg/dL — ABNORMAL HIGH (ref 0.76–1.27)
GFR calc Af Amer: 53 mL/min/{1.73_m2} — ABNORMAL LOW (ref >59)
GFR calc non Af Amer: 46 mL/min/{1.73_m2} — ABNORMAL LOW (ref >59)
GLUCOSE: 79 mg/dL (ref 65–99)
POTASSIUM: 3.6 mmol/L (ref 3.5–5.2)
Sodium: 140 mmol/L (ref 134–144)

## 2018-01-07 LAB — URIC ACID: URIC ACID: 8.9 mg/dL — AB (ref 3.7–8.6)

## 2018-01-19 ENCOUNTER — Other Ambulatory Visit: Payer: Self-pay | Admitting: Family Medicine

## 2018-02-24 DIAGNOSIS — B351 Tinea unguium: Secondary | ICD-10-CM | POA: Diagnosis not present

## 2018-02-24 DIAGNOSIS — M79674 Pain in right toe(s): Secondary | ICD-10-CM | POA: Diagnosis not present

## 2018-02-25 ENCOUNTER — Other Ambulatory Visit: Payer: Self-pay | Admitting: Family Medicine

## 2018-03-24 DIAGNOSIS — H3554 Dystrophies primarily involving the retinal pigment epithelium: Secondary | ICD-10-CM | POA: Diagnosis not present

## 2018-03-29 DIAGNOSIS — H02886 Meibomian gland dysfunction of left eye, unspecified eyelid: Secondary | ICD-10-CM | POA: Diagnosis not present

## 2018-03-29 DIAGNOSIS — H02833 Dermatochalasis of right eye, unspecified eyelid: Secondary | ICD-10-CM | POA: Diagnosis not present

## 2018-03-29 DIAGNOSIS — H02883 Meibomian gland dysfunction of right eye, unspecified eyelid: Secondary | ICD-10-CM | POA: Diagnosis not present

## 2018-03-29 DIAGNOSIS — H02403 Unspecified ptosis of bilateral eyelids: Secondary | ICD-10-CM | POA: Diagnosis not present

## 2018-03-29 DIAGNOSIS — Z961 Presence of intraocular lens: Secondary | ICD-10-CM | POA: Diagnosis not present

## 2018-03-29 DIAGNOSIS — H47391 Other disorders of optic disc, right eye: Secondary | ICD-10-CM | POA: Diagnosis not present

## 2018-03-29 DIAGNOSIS — H02836 Dermatochalasis of left eye, unspecified eyelid: Secondary | ICD-10-CM | POA: Diagnosis not present

## 2018-03-29 DIAGNOSIS — H04123 Dry eye syndrome of bilateral lacrimal glands: Secondary | ICD-10-CM | POA: Diagnosis not present

## 2018-03-29 DIAGNOSIS — H3554 Dystrophies primarily involving the retinal pigment epithelium: Secondary | ICD-10-CM | POA: Diagnosis not present

## 2018-04-19 ENCOUNTER — Other Ambulatory Visit: Payer: Self-pay | Admitting: Family Medicine

## 2018-05-05 DIAGNOSIS — M79674 Pain in right toe(s): Secondary | ICD-10-CM | POA: Diagnosis not present

## 2018-05-05 DIAGNOSIS — B351 Tinea unguium: Secondary | ICD-10-CM | POA: Diagnosis not present

## 2018-05-08 ENCOUNTER — Encounter: Payer: Self-pay | Admitting: Family Medicine

## 2018-05-08 ENCOUNTER — Ambulatory Visit (INDEPENDENT_AMBULATORY_CARE_PROVIDER_SITE_OTHER): Payer: Medicare Other | Admitting: Family Medicine

## 2018-05-08 VITALS — BP 124/70 | HR 63 | Ht 69.0 in | Wt 189.0 lb

## 2018-05-08 DIAGNOSIS — R3912 Poor urinary stream: Secondary | ICD-10-CM | POA: Diagnosis not present

## 2018-05-08 DIAGNOSIS — N401 Enlarged prostate with lower urinary tract symptoms: Secondary | ICD-10-CM | POA: Diagnosis not present

## 2018-05-08 DIAGNOSIS — J432 Centrilobular emphysema: Secondary | ICD-10-CM | POA: Diagnosis not present

## 2018-05-08 DIAGNOSIS — D692 Other nonthrombocytopenic purpura: Secondary | ICD-10-CM

## 2018-05-08 DIAGNOSIS — I10 Essential (primary) hypertension: Secondary | ICD-10-CM

## 2018-05-08 MED ORDER — BUDESONIDE-FORMOTEROL FUMARATE 160-4.5 MCG/ACT IN AERO: 2.0000 | INHALATION_SPRAY | Freq: Two times a day (BID) | RESPIRATORY_TRACT | 5 refills | Status: DC

## 2018-05-08 NOTE — Progress Notes (Signed)
   Subjective:    Patient ID: Gerald Hall, male    DOB: 11/29/1934, 83 y.o.   MRN: [id number]   Hypertension   This is a chronic problem. Associated symptoms include shortness of breath. Pertinent negatives include no chest pain or headaches. There are no compliance problems.    Sob. Taking albuterol and symbicort.  Patient relates significant shortness of breath with activity he does have a history of COPD.  He is tried Symbicort does not seem to be very much uses albuterol He still smokes he has been counseled to quit smoking.  But the patient feels that this is one pleasure that he still has.  He denies being depressed he lost his wife over a year ago to sickness  He does have some urinary incontinence issues He has not had any flareups of his temporal arteritis He does have significant BPH He also has intermittent senile purpura but no significant bruising lately except for a little bit on the arms Review of Systems  Constitutional: Negative for activity change, appetite change and fatigue.  HENT: Negative for congestion and rhinorrhea.   Respiratory: Positive for shortness of breath. Negative for cough and chest tightness.   Cardiovascular: Negative for chest pain and leg swelling.  Gastrointestinal: Negative for abdominal pain, diarrhea and nausea.  Endocrine: Negative for polydipsia and polyphagia.  Genitourinary: Negative for dysuria and hematuria.  Neurological: Negative for weakness and headaches.  Psychiatric/Behavioral: Negative for confusion and dysphoric mood.  Patient relates bowel movements are thin but not bloody     Objective:   Physical Exam  Constitutional: He appears well-nourished. No distress.  Cardiovascular: Normal rate, regular rhythm and normal heart sounds.  No murmur heard. Pulmonary/Chest: Effort normal and breath sounds normal. No respiratory distress.  Musculoskeletal: He exhibits no edema.  Lymphadenopathy:    He has no cervical adenopathy.    Neurological: He is alert.  Psychiatric: His behavior is normal.  Vitals reviewed.  Prostate exam shows enlarged prostate Some bruising on the arms      Assessment & Plan:  Bowel movements-he was advised to increase fiber in the diet I doubt that this is colon cancer but if it progresses we will need to do some testing  Moderate prostate enlargement patient does not want procedure he will currently continue medication  Senile purpura stable  COPD poor control increase Symbicort strength if this does not do well enough then add Spiriva patient will give [place] follow-up in a few weeks  Blood pressure good control continue current measures  Smoking cessation counseling given Also we did check oxygen sitting and then checked after 2 laps around our office and it stayed stable at 95 and 96Therefore supplemental oxygen is not necessary Follow-up patient 3 months  25 minutes was spent with the patient.  This statement verifies that 25 minutes was indeed spent with the patient.  More than 50% of this visit-total duration of the visit-was spent in counseling and coordination of care. The issues that the patient came in for today as reflected in the diagnosis (s) please refer to documentation for further details.

## 2018-06-22 ENCOUNTER — Other Ambulatory Visit: Payer: Self-pay | Admitting: Family Medicine

## 2018-07-11 ENCOUNTER — Encounter (HOSPITAL_COMMUNITY): Payer: Self-pay | Admitting: Physical Therapy

## 2018-07-14 DIAGNOSIS — M79674 Pain in right toe(s): Secondary | ICD-10-CM | POA: Diagnosis not present

## 2018-07-14 DIAGNOSIS — B351 Tinea unguium: Secondary | ICD-10-CM | POA: Diagnosis not present

## 2018-07-21 ENCOUNTER — Other Ambulatory Visit: Payer: Self-pay | Admitting: Family Medicine

## 2018-08-07 ENCOUNTER — Ambulatory Visit (INDEPENDENT_AMBULATORY_CARE_PROVIDER_SITE_OTHER): Payer: Medicare Other | Admitting: Family Medicine

## 2018-08-07 ENCOUNTER — Encounter: Payer: Self-pay | Admitting: Family Medicine

## 2018-08-07 VITALS — BP 118/76 | Ht 69.0 in | Wt 189.0 lb

## 2018-08-07 DIAGNOSIS — R350 Frequency of micturition: Secondary | ICD-10-CM

## 2018-08-07 DIAGNOSIS — I10 Essential (primary) hypertension: Secondary | ICD-10-CM

## 2018-08-07 DIAGNOSIS — E876 Hypokalemia: Secondary | ICD-10-CM | POA: Diagnosis not present

## 2018-08-07 DIAGNOSIS — R0600 Dyspnea, unspecified: Secondary | ICD-10-CM

## 2018-08-07 DIAGNOSIS — Z23 Encounter for immunization: Secondary | ICD-10-CM

## 2018-08-07 DIAGNOSIS — G2581 Restless legs syndrome: Secondary | ICD-10-CM

## 2018-08-07 NOTE — Progress Notes (Signed)
Subjective:    Patient ID: Gerald Hall, male    DOB: 05-22-1935, 83 y.o.   MRN: [id number]  HPI Patient is here today to follow up on Chronic health issues.He eats healthy, and does not exercise. He does not see any specialists.He states the Symbicort is helping his breathing.    Patient for blood pressure check up.  The patient does have hypertension.  The patient is on medication.  Patient relates compliance with meds. Todays BP reviewed with the patient. Patient denies issues with medication. Patient relates reasonable diet. Patient tries to minimize salt. Patient aware of BP goals.  Patient has restless legs without medicine he has a hard time resting he states he takes a medicine on a regular basis.  Denies any other particular troubles  Patient has COPD issues although the pulmonary function test did not show any severe issues does smoke he knows he needs to quit continues to smoke unlikely he will ever quit he does well on Symbicort he will continue those refills sent in  Patient has frequent urination may possibly have some BPH as well as incomplete emptying of the bladder or overactive bladder patient not interested in urology consultation at this point  Review of Systems  Constitutional: Negative for diaphoresis and fatigue.  HENT: Negative for congestion and rhinorrhea.   Respiratory: Negative for cough and shortness of breath.   Cardiovascular: Negative for chest pain and leg swelling.  Gastrointestinal: Negative for abdominal pain and diarrhea.  Skin: Negative for color change and rash.  Neurological: Negative for dizziness and headaches.  Psychiatric/Behavioral: Negative for behavioral problems and confusion.       Objective:   Physical Exam  Constitutional: He appears well-nourished. No distress.  HENT:  Head: Normocephalic and atraumatic.  Eyes: Right eye exhibits no discharge. Left eye exhibits no discharge.  Neck: No tracheal deviation present.    Cardiovascular: Normal rate, regular rhythm and normal heart sounds.  No murmur heard. Pulmonary/Chest: Effort normal and breath sounds normal. No respiratory distress.  Musculoskeletal: He exhibits no edema.  Lymphadenopathy:    He has no cervical adenopathy.  Neurological: He is alert. Coordination normal.  Skin: Skin is warm and dry.  Psychiatric: He has a normal mood and affect. His behavior is normal.  Vitals reviewed.         Assessment & Plan:  HTN- Patient was seen today as part of a visit regarding hypertension. The importance of healthy diet and regular physical activity was discussed. The importance of compliance with medications discussed.  Ideal goal is to keep blood pressure low elevated levels certainly below 176/16 when possible.  The patient was counseled that keeping blood pressure under control lessen his risk of complications.  The importance of regular follow-ups was discussed with the patient.  Low-salt diet such as DASH recommended.  Regular physical activity was recommended as well.  Patient was advised to keep regular follow-ups.  COPD doing well on Symbicort and albuterol continue current measures  Restless legs doing well with medication continue current measures  Frequent urination Flomax is helping but patient states there may be some aspects of him having excessive urination but he thinks this could be incomplete emptying of his bladder versus overactive bladder he will watch this and let us know if he would like to see urology I offered him urology he deferred at this point  Metabolic 7 within the next few weeks Flu vaccine today Follow-up by spring  Patient with chronic kidney disease we  will check metabolic 7 to follow-up on this minimize NSAIDs no sign of fluid overload on today's exam

## 2018-08-08 LAB — BASIC METABOLIC PANEL
BUN / CREAT RATIO: 10 (ref 10–24)
BUN: 12 mg/dL (ref 8–27)
CO2: 26 mmol/L (ref 20–29)
CREATININE: 1.21 mg/dL (ref 0.76–1.27)
Calcium: 9.5 mg/dL (ref 8.6–10.2)
Chloride: 93 mmol/L — ABNORMAL LOW (ref 96–106)
GFR calc Af Amer: 65 mL/min/{1.73_m2} (ref >59)
GFR calc non Af Amer: 56 mL/min/{1.73_m2} — ABNORMAL LOW (ref >59)
GLUCOSE: 87 mg/dL (ref 65–99)
POTASSIUM: 3.3 mmol/L — AB (ref 3.5–5.2)
SODIUM: 136 mmol/L (ref 134–144)

## 2018-08-09 MED ORDER — POTASSIUM CHLORIDE CRYS ER 10 MEQ PO TBCR: EXTENDED_RELEASE_TABLET | ORAL | 1 refills | Status: DC

## 2018-08-09 NOTE — Addendum Note (Signed)
Addended by: Karle Barr on: 08/09/2018 10:42 AM   Modules accepted: Orders

## 2018-09-08 ENCOUNTER — Other Ambulatory Visit: Payer: Self-pay | Admitting: Family Medicine

## 2018-09-08 ENCOUNTER — Other Ambulatory Visit: Payer: Self-pay

## 2018-09-18 ENCOUNTER — Other Ambulatory Visit: Payer: Self-pay | Admitting: Family Medicine

## 2018-09-29 DIAGNOSIS — B351 Tinea unguium: Secondary | ICD-10-CM | POA: Diagnosis not present

## 2018-09-29 DIAGNOSIS — M79674 Pain in right toe(s): Secondary | ICD-10-CM | POA: Diagnosis not present

## 2018-10-16 ENCOUNTER — Other Ambulatory Visit: Payer: Self-pay | Admitting: Family Medicine

## 2018-11-16 ENCOUNTER — Other Ambulatory Visit: Payer: Self-pay | Admitting: Family Medicine

## 2018-11-20 DIAGNOSIS — Z872 Personal history of diseases of the skin and subcutaneous tissue: Secondary | ICD-10-CM | POA: Diagnosis not present

## 2018-11-20 DIAGNOSIS — C44729 Squamous cell carcinoma of skin of left lower limb, including hip: Secondary | ICD-10-CM | POA: Diagnosis not present

## 2018-11-20 DIAGNOSIS — L570 Actinic keratosis: Secondary | ICD-10-CM | POA: Diagnosis not present

## 2018-11-20 DIAGNOSIS — Z859 Personal history of malignant neoplasm, unspecified: Secondary | ICD-10-CM | POA: Diagnosis not present

## 2018-11-20 DIAGNOSIS — L578 Other skin changes due to chronic exposure to nonionizing radiation: Secondary | ICD-10-CM | POA: Diagnosis not present

## 2018-11-20 DIAGNOSIS — Z85828 Personal history of other malignant neoplasm of skin: Secondary | ICD-10-CM | POA: Diagnosis not present

## 2018-11-20 DIAGNOSIS — D485 Neoplasm of uncertain behavior of skin: Secondary | ICD-10-CM | POA: Diagnosis not present

## 2018-12-05 ENCOUNTER — Telehealth: Payer: Self-pay | Admitting: Family Medicine

## 2018-12-05 NOTE — Telephone Encounter (Signed)
Contacted pharmacy. Pharmacist states that the co pay is so high probably due to patient having a beginning of the year deductible. It may be also because it is to soon to have filled. Pharmacy said Advair may be covered. Contacted patient to inform him that he may need to call insurance company and see what is covered. Pt states that he will call insurance company and give [place] a call back.

## 2018-12-05 NOTE — Telephone Encounter (Signed)
Unfortunately different healthcare plans cover different items differently.  There are very limited number of inhalers that help with COPD and all of them are high priced medicines.  Please talk with pharmacy see if they would be able to figure out what is his best priced option with his insurance.  If they are not able to figure out then [place] healthcare would need to be contacted.  Possibly the patient will have to call his insurance company to find out which one is covered better.  Our Epic  system will not let us know this.  Other options include Sharrie Rothman, Trelegy

## 2018-12-05 NOTE — Telephone Encounter (Signed)
Pt returned call and the insurance company told him that he can fill symbicort Saturday with a $47 co pay. Pt states to not worry about it at this time. He states he will call back if he has problems

## 2018-12-05 NOTE — Telephone Encounter (Signed)
Contacted pt insurance due to getting a fax stating that Symbiocort inhaler 160-4.5 mcg has limited supply due to not being covered. [employer] and this med is covered. [place] and informed Mitzi Hansen Warehouse manager) that the inhaler is covered. Pharmacist is aware and informed me that patient has high deductible. Pt copay for each moth is $100. Pt did receive this inhaler on 11/16/2018. Contacted pt and pt states that he has switched to [employer] advantage plan and they are not paying anything on the inhaler. Pt would like something else sent in. Please advise. Thank you.

## 2018-12-09 ENCOUNTER — Other Ambulatory Visit: Payer: Self-pay | Admitting: Family Medicine

## 2018-12-13 DIAGNOSIS — C44729 Squamous cell carcinoma of skin of left lower limb, including hip: Secondary | ICD-10-CM | POA: Diagnosis not present

## 2018-12-18 DIAGNOSIS — I872 Venous insufficiency (chronic) (peripheral): Secondary | ICD-10-CM | POA: Diagnosis not present

## 2018-12-25 DIAGNOSIS — C44729 Squamous cell carcinoma of skin of left lower limb, including hip: Secondary | ICD-10-CM | POA: Diagnosis not present

## 2019-01-01 DIAGNOSIS — I872 Venous insufficiency (chronic) (peripheral): Secondary | ICD-10-CM | POA: Diagnosis not present

## 2019-01-16 ENCOUNTER — Other Ambulatory Visit: Payer: Self-pay | Admitting: Family Medicine

## 2019-01-23 ENCOUNTER — Other Ambulatory Visit: Payer: Self-pay | Admitting: Family Medicine

## 2019-02-06 ENCOUNTER — Ambulatory Visit (INDEPENDENT_AMBULATORY_CARE_PROVIDER_SITE_OTHER): Payer: Medicare Other | Admitting: Family Medicine

## 2019-02-06 ENCOUNTER — Other Ambulatory Visit: Payer: Self-pay | Admitting: Family Medicine

## 2019-02-06 ENCOUNTER — Encounter: Payer: Self-pay | Admitting: Family Medicine

## 2019-02-06 VITALS — Wt 180.0 lb

## 2019-02-06 DIAGNOSIS — R350 Frequency of micturition: Secondary | ICD-10-CM | POA: Diagnosis not present

## 2019-02-06 DIAGNOSIS — N401 Enlarged prostate with lower urinary tract symptoms: Secondary | ICD-10-CM | POA: Diagnosis not present

## 2019-02-06 DIAGNOSIS — E785 Hyperlipidemia, unspecified: Secondary | ICD-10-CM

## 2019-02-06 DIAGNOSIS — I10 Essential (primary) hypertension: Secondary | ICD-10-CM

## 2019-02-06 NOTE — Progress Notes (Signed)
Labs mailed to patient with instructions to have labs done in July

## 2019-02-06 NOTE — Progress Notes (Signed)
   Subjective:  Telephone visit Unable to do video Coronavirus outbreak   Patient ID: Gerald Hall, male    DOB: [birth year], 84 y.o.   MRN: [id number]  Hypertension  This is a chronic problem. There are no compliance problems.   Overall patient doing well with blood pressure medicine He feels things are going okay Energy level not quite as good as it used to be He has a full-time attendant helping him He is staying at home to lessen his risk of coronavirus Pt states he has not been checking his BP lately. Pt has problems on shin; dermatogist took off 2 growths and the area is not healing.   Virtual Visit via Telephone Note  I connected with Gerald Hall on 02/06/19 at  1:10 PM EDT by telephone and verified that I am speaking with the correct person using two identifiers.   I discussed the limitations, risks, security and privacy concerns of performing an evaluation and management service by telephone and the availability of in person appointments. I also discussed with the patient that there may be a patient responsible charge related to this service. The patient expressed understanding and agreed to proceed.   History of Present Illness:    Observations/Objective:   Assessment and Plan:   Follow Up Instructions:    I discussed the assessment and treatment plan with the patient. The patient was provided an opportunity to ask questions and all were answered. The patient agreed with the plan and demonstrated an understanding of the instructions.   The patient was advised to call back or seek an in-person evaluation if the symptoms worsen or if the condition fails to improve as anticipated.  I provided 15 minutes of non-face-to-face time during this encounter. Coronavirus protection was discussed with patient  Gerald Males, LPN    Review of Systems     Objective:   Physical Exam  Unable to do physical exam via telephone     Assessment & Plan:  BPH with  urinary frequency continue Flomax on a regular basis follow-up if any ongoing trouble referral to [facility] for further evaluation possible procedure  HTN decent control continue current measures.  Lab work in the summer follow-up in 3 to 4 months  Underlying lung issues stable on the Symbicort continue current measures.  Follow-up if progressive troubles or worse

## 2019-02-06 NOTE — Progress Notes (Signed)
Lab orders placed and mailed to patient with instructions to have done in July.

## 2019-02-07 ENCOUNTER — Other Ambulatory Visit: Payer: Self-pay

## 2019-02-07 DIAGNOSIS — R350 Frequency of micturition: Secondary | ICD-10-CM

## 2019-02-07 MED ORDER — HYDROCHLOROTHIAZIDE 25 MG PO TABS: 25.0000 mg | ORAL_TABLET | Freq: Every day | ORAL | 1 refills | Status: DC

## 2019-02-07 MED ORDER — TAMSULOSIN HCL 0.4 MG PO CAPS: 0.4000 mg | ORAL_CAPSULE | Freq: Every day | ORAL | 1 refills | Status: DC

## 2019-02-07 MED ORDER — ROPINIROLE HCL 2 MG PO TABS: ORAL_TABLET | ORAL | 5 refills | Status: DC

## 2019-02-07 MED ORDER — POTASSIUM CHLORIDE ER 10 MEQ PO TBCR: EXTENDED_RELEASE_TABLET | ORAL | 1 refills | Status: DC

## 2019-02-08 DIAGNOSIS — C44729 Squamous cell carcinoma of skin of left lower limb, including hip: Secondary | ICD-10-CM | POA: Diagnosis not present

## 2019-02-19 DIAGNOSIS — D0472 Carcinoma in situ of skin of left lower limb, including hip: Secondary | ICD-10-CM | POA: Diagnosis not present

## 2019-02-23 ENCOUNTER — Other Ambulatory Visit: Payer: Self-pay

## 2019-02-23 ENCOUNTER — Emergency Department (HOSPITAL_COMMUNITY): Payer: Medicare Other

## 2019-02-23 ENCOUNTER — Inpatient Hospital Stay (HOSPITAL_COMMUNITY)
Admission: EM | Admit: 2019-02-23 | Discharge: 2019-02-25 | DRG: 308 | Disposition: A | Discharge status: Home / Self Care | Payer: Medicare Other | Attending: Family Medicine | Admitting: Family Medicine

## 2019-02-23 ENCOUNTER — Encounter (HOSPITAL_COMMUNITY): Payer: Self-pay

## 2019-02-23 DIAGNOSIS — Z7951 Long term (current) use of inhaled steroids: Secondary | ICD-10-CM | POA: Diagnosis not present

## 2019-02-23 DIAGNOSIS — I482 Chronic atrial fibrillation, unspecified: Secondary | ICD-10-CM | POA: Diagnosis present

## 2019-02-23 DIAGNOSIS — M1990 Unspecified osteoarthritis, unspecified site: Secondary | ICD-10-CM | POA: Diagnosis not present

## 2019-02-23 DIAGNOSIS — N401 Enlarged prostate with lower urinary tract symptoms: Secondary | ICD-10-CM | POA: Diagnosis not present

## 2019-02-23 DIAGNOSIS — R0689 Other abnormalities of breathing: Secondary | ICD-10-CM | POA: Diagnosis not present

## 2019-02-23 DIAGNOSIS — Z7982 Long term (current) use of aspirin: Secondary | ICD-10-CM

## 2019-02-23 DIAGNOSIS — E785 Hyperlipidemia, unspecified: Secondary | ICD-10-CM | POA: Diagnosis present

## 2019-02-23 DIAGNOSIS — J189 Pneumonia, unspecified organism: Secondary | ICD-10-CM | POA: Diagnosis not present

## 2019-02-23 DIAGNOSIS — H3530 Unspecified macular degeneration: Secondary | ICD-10-CM | POA: Diagnosis present

## 2019-02-23 DIAGNOSIS — R3912 Poor urinary stream: Secondary | ICD-10-CM

## 2019-02-23 DIAGNOSIS — Z96641 Presence of right artificial hip joint: Secondary | ICD-10-CM | POA: Diagnosis present

## 2019-02-23 DIAGNOSIS — Z809 Family history of malignant neoplasm, unspecified: Secondary | ICD-10-CM | POA: Diagnosis not present

## 2019-02-23 DIAGNOSIS — I10 Essential (primary) hypertension: Secondary | ICD-10-CM

## 2019-02-23 DIAGNOSIS — J181 Lobar pneumonia, unspecified organism: Secondary | ICD-10-CM | POA: Diagnosis not present

## 2019-02-23 DIAGNOSIS — F17200 Nicotine dependence, unspecified, uncomplicated: Secondary | ICD-10-CM | POA: Diagnosis not present

## 2019-02-23 DIAGNOSIS — Z20828 Contact with and (suspected) exposure to other viral communicable diseases: Secondary | ICD-10-CM | POA: Diagnosis present

## 2019-02-23 DIAGNOSIS — H9191 Unspecified hearing loss, right ear: Secondary | ICD-10-CM | POA: Diagnosis present

## 2019-02-23 DIAGNOSIS — Z888 Allergy status to other drugs, medicaments and biological substances status: Secondary | ICD-10-CM | POA: Diagnosis not present

## 2019-02-23 DIAGNOSIS — R531 Weakness: Secondary | ICD-10-CM | POA: Diagnosis not present

## 2019-02-23 DIAGNOSIS — G2581 Restless legs syndrome: Secondary | ICD-10-CM | POA: Diagnosis present

## 2019-02-23 DIAGNOSIS — I4891 Unspecified atrial fibrillation: Principal | ICD-10-CM

## 2019-02-23 DIAGNOSIS — I959 Hypotension, unspecified: Secondary | ICD-10-CM | POA: Diagnosis not present

## 2019-02-23 DIAGNOSIS — I491 Atrial premature depolarization: Secondary | ICD-10-CM | POA: Diagnosis not present

## 2019-02-23 DIAGNOSIS — R918 Other nonspecific abnormal finding of lung field: Secondary | ICD-10-CM | POA: Diagnosis not present

## 2019-02-23 DIAGNOSIS — Z79899 Other long term (current) drug therapy: Secondary | ICD-10-CM

## 2019-02-23 DIAGNOSIS — Z9842 Cataract extraction status, left eye: Secondary | ICD-10-CM | POA: Diagnosis not present

## 2019-02-23 DIAGNOSIS — R42 Dizziness and giddiness: Secondary | ICD-10-CM | POA: Diagnosis not present

## 2019-02-23 DIAGNOSIS — Z9841 Cataract extraction status, right eye: Secondary | ICD-10-CM | POA: Diagnosis not present

## 2019-02-23 DIAGNOSIS — R000 Tachycardia, unspecified: Secondary | ICD-10-CM | POA: Diagnosis not present

## 2019-02-23 DIAGNOSIS — F17210 Nicotine dependence, cigarettes, uncomplicated: Secondary | ICD-10-CM | POA: Diagnosis present

## 2019-02-23 DIAGNOSIS — N400 Enlarged prostate without lower urinary tract symptoms: Secondary | ICD-10-CM | POA: Diagnosis present

## 2019-02-23 LAB — CBC
HCT: 56.6 % — ABNORMAL HIGH (ref 39.0–52.0)
Hemoglobin: 18.5 g/dL — ABNORMAL HIGH (ref 13.0–17.0)
MCH: 31.3 pg (ref 26.0–34.0)
MCHC: 32.7 g/dL (ref 30.0–36.0)
MCV: 95.8 fL (ref 80.0–100.0)
Platelets: 283 10*3/uL (ref 150–400)
RBC: 5.91 MIL/uL — ABNORMAL HIGH (ref 4.22–5.81)
RDW: 13.8 % (ref 11.5–15.5)
WBC: 13.8 10*3/uL — ABNORMAL HIGH (ref 4.0–10.5)
nRBC: 0 % (ref 0.0–0.2)

## 2019-02-23 LAB — BASIC METABOLIC PANEL
Anion gap: 11 (ref 5–15)
BUN: 16 mg/dL (ref 8–23)
CO2: 25 mmol/L (ref 22–32)
Calcium: 8.8 mg/dL — ABNORMAL LOW (ref 8.9–10.3)
Chloride: 99 mmol/L (ref 98–111)
Creatinine, Ser: 1.63 mg/dL — ABNORMAL HIGH (ref 0.61–1.24)
GFR calc Af Amer: 45 mL/min — ABNORMAL LOW (ref >60)
GFR calc non Af Amer: 39 mL/min — ABNORMAL LOW (ref >60)
Glucose, Bld: 112 mg/dL — ABNORMAL HIGH (ref 70–99)
Potassium: 3.3 mmol/L — ABNORMAL LOW (ref 3.5–5.1)
Sodium: 135 mmol/L (ref 135–145)

## 2019-02-23 LAB — TSH: TSH: 1.047 u[IU]/mL (ref 0.350–4.500)

## 2019-02-23 LAB — SARS CORONAVIRUS 2 BY RT PCR (HOSPITAL ORDER, PERFORMED IN ~~LOC~~ HOSPITAL LAB): SARS Coronavirus 2: NEGATIVE

## 2019-02-23 LAB — PROCALCITONIN: Procalcitonin: 4.67 ng/mL

## 2019-02-23 LAB — MAGNESIUM: Magnesium: 2.1 mg/dL (ref 1.7–2.4)

## 2019-02-23 LAB — LACTIC ACID, PLASMA: Lactic Acid, Venous: 1.8 mmol/L (ref 0.5–1.9)

## 2019-02-23 MED ORDER — LEVOFLOXACIN IN D5W 500 MG/100ML IV SOLN
500.0000 mg | Freq: Once | INTRAVENOUS | Status: AC
  Administered 2019-02-23: 500 mg via INTRAVENOUS
  Filled 2019-02-23: qty 100

## 2019-02-23 MED ORDER — FLUTICASONE PROPIONATE 50 MCG/ACT NA SUSP
2.0000 | Freq: Every day | NASAL | Status: DC
  Administered 2019-02-24 – 2019-02-25 (×2): 2 via NASAL
  Filled 2019-02-23: qty 16

## 2019-02-23 MED ORDER — VITAMIN C 500 MG PO TABS
500.0000 mg | ORAL_TABLET | Freq: Every day | ORAL | Status: DC
  Administered 2019-02-24 – 2019-02-25 (×2): 500 mg via ORAL
  Filled 2019-02-23 (×2): qty 1

## 2019-02-23 MED ORDER — MOMETASONE FURO-FORMOTEROL FUM 200-5 MCG/ACT IN AERO
2.0000 | INHALATION_SPRAY | Freq: Two times a day (BID) | RESPIRATORY_TRACT | Status: DC
  Administered 2019-02-24 – 2019-02-25 (×3): 2 via RESPIRATORY_TRACT
  Filled 2019-02-23: qty 8.8

## 2019-02-23 MED ORDER — DILTIAZEM HCL 100 MG IV SOLR
5.0000 mg/h | INTRAVENOUS | Status: DC
  Administered 2019-02-24: 04:00:00 7.5 mg/h via INTRAVENOUS
  Filled 2019-02-23 (×2): qty 100

## 2019-02-23 MED ORDER — TAMSULOSIN HCL 0.4 MG PO CAPS
0.4000 mg | ORAL_CAPSULE | Freq: Every day | ORAL | Status: DC
  Administered 2019-02-24 (×2): 0.4 mg via ORAL
  Filled 2019-02-23 (×2): qty 1

## 2019-02-23 MED ORDER — POLYETHYL GLYCOL-PROPYL GLYCOL 0.4-0.3 % OP SOLN: 1.0000 [drp] | Freq: Four times a day (QID) | OPHTHALMIC | Status: DC | PRN

## 2019-02-23 MED ORDER — LEVOFLOXACIN 750 MG PO TABS
750.0000 mg | ORAL_TABLET | ORAL | Status: DC
  Administered 2019-02-24: 750 mg via ORAL
  Filled 2019-02-23: qty 1

## 2019-02-23 MED ORDER — ROPINIROLE HCL 1 MG PO TABS
ORAL_TABLET | ORAL | Status: AC
  Filled 2019-02-23: qty 4

## 2019-02-23 MED ORDER — ENOXAPARIN SODIUM 40 MG/0.4ML ~~LOC~~ SOLN
40.0000 mg | SUBCUTANEOUS | Status: DC
  Administered 2019-02-24 (×2): 40 mg via SUBCUTANEOUS
  Filled 2019-02-23 (×2): qty 0.4

## 2019-02-23 MED ORDER — ASPIRIN EC 325 MG PO TBEC
325.0000 mg | DELAYED_RELEASE_TABLET | Freq: Every day | ORAL | Status: DC
  Administered 2019-02-24 – 2019-02-25 (×3): 325 mg via ORAL
  Filled 2019-02-23 (×3): qty 1

## 2019-02-23 MED ORDER — DILTIAZEM LOAD VIA INFUSION
10.0000 mg | Freq: Once | INTRAVENOUS | Status: AC
  Administered 2019-02-23: 10 mg via INTRAVENOUS
  Filled 2019-02-23: qty 10

## 2019-02-23 MED ORDER — ALBUTEROL SULFATE HFA 108 (90 BASE) MCG/ACT IN AERS
2.0000 | INHALATION_SPRAY | RESPIRATORY_TRACT | Status: DC | PRN
  Filled 2019-02-23: qty 6.7

## 2019-02-23 MED ORDER — SODIUM CHLORIDE 0.9 % IV SOLN: INTRAVENOUS | Status: DC

## 2019-02-23 MED ORDER — ROPINIROLE HCL 1 MG PO TABS
4.0000 mg | ORAL_TABLET | Freq: Every day | ORAL | Status: DC
  Administered 2019-02-24 (×2): 4 mg via ORAL
  Filled 2019-02-23 (×4): qty 4

## 2019-02-23 MED ORDER — POTASSIUM CHLORIDE CRYS ER 20 MEQ PO TBCR
40.0000 meq | EXTENDED_RELEASE_TABLET | Freq: Two times a day (BID) | ORAL | Status: DC
  Administered 2019-02-23 – 2019-02-24 (×3): 40 meq via ORAL
  Filled 2019-02-23 (×3): qty 2

## 2019-02-23 NOTE — ED Notes (Signed)
Updated patient's daughter Judson Roch via telephone. Judson Roch states to call her with information and updates. Number is [phone].

## 2019-02-23 NOTE — ED Triage Notes (Signed)
Ems reports pt called ems for c/o dizziness, generalized weakness, and diarrhea.  EMS says when they arrived pt was hypotensive, 88/60.  Reports pt had a tooth pulled Tuesday and started amoxicillin yesterday for infection.  Reports has had 2 episodes of diarrhea.  EMS gave approx 937ml bolus of nss.  HR fluctuated between 130 and 155 and reports bigeminal pvc's.  PVCs improved with o2 administration but did see some couplets.  EMS administered 6mg  adenosine pta for HR 155.  HR slowed briefly but increased to 145.  Pt alert and oriented.  Denies any pain at this time.

## 2019-02-23 NOTE — Care Management Obs Status (Signed)
[place] NOTIFICATION   Patient Details  Name: ALEZANDER DIMAANO MRN: [id number] Date of Birth: 02/26/1935   Medicare Observation Status Notification Given:  Other (see comment)(Notice provided to Pt's nurse)    Jake Church, LCSW 02/23/2019, 10:09 PM

## 2019-02-23 NOTE — ED Notes (Signed)
Lottie Dawson [phone] daughter called for update.  Update given.

## 2019-02-23 NOTE — ED Notes (Signed)
Updated patient's daughter Judson Roch via telephone. Advised patient was being admitted.

## 2019-02-23 NOTE — H&P (Signed)
History and Physical  AARONJAMES KELSAY VEL:[id number] DOB: Feb 22, 1935 DOA: 02/23/2019  Referring physician: Dr Vanita Panda, ED physician PCP: Kathyrn Drown, MD  Outpatient Specialists:   Patient Coming From: home  Chief Complaint: weakness  HPI: Gerald Hall is a 84 y.o. male with a history of tobacco abuse, hypertension, hyperlipidemia.  Patient's presents with fatigue, diarrhea, worsening cough and shortness of breath over the past week.  No palliating or provoking factors to his cough or shortness of breath.  His cough is nonproductive, but loose.  He smokes three fourths of a pack of cigarettes a day.  He was brought to the hospital by EMS who noticed that he was hypotensive with tachycardia.  He received 900 mL's of fluid with a couple doses of adenosine without change to his cardiac rhythm.  Emergency Department Course: Patient noted to be in atrial fibrillation with rapid ventricular rate.  Patient started on Cardizem.  Patient converted to sinus rhythm.  Review of Systems:   Pt denies any fevers, chills, nausea, vomiting, constipation, abdominal pain, shortness of breath, wheezing, palpitations, headache, vision changes, lightheadedness, dizziness, melena, rectal bleeding.  Review of systems are otherwise negative  Past Medical History:  Diagnosis Date   Arthritis    Balance problem    uses a walker   Blood in urine    Sees urology yearly   Deafness in right ear age 62   Hyperlipidemia    Hypertension    Temporal arteritis ([place]) 2016   Urinary incontinence    Past Surgical History:  Procedure Laterality Date   BACK SURGERY  2010   CATARACT EXTRACTION Bilateral 07/30/14, 08/13/2014   CERVICAL SPINE SURGERY  09/01/15   C 4-5 , [place]   COLONOSCOPY  12/19/2007   RMR: 1. External hemorrohids, otherwise normal rectum.2. Normal colon. 3. Normal terminal ileum.   COLONOSCOPY N/A 12/13/2014   Procedure: COLONOSCOPY;  Surgeon: Daneil Dolin, MD;   Location: [facility];  Service: Endoscopy;  Laterality: N/A;  11:15 Pt Request Time   KNEE SURGERY Left    around 2000, arthroscopic   TOTAL HIP ARTHROPLASTY Right 02/23/2017   Procedure: TOTAL HIP ARTHROPLASTY ANTERIOR APPROACH;  Surgeon: Hessie Knows, MD;  Location: [facility] ORS;  Service: Orthopedics;  Laterality: Right;   Social History:  reports that he has been smoking cigarettes. He has been smoking about 0.50 packs per day. He has never used smokeless tobacco. He reports current alcohol use. He reports that he does not use drugs. Patient lives at home  No Known Allergies  Family History  Problem Relation Age of Onset   Cancer Brother        colon   Diabetes Brother    Diabetes Brother       Prior to Admission medications   Medication Sig Start Date End Date Taking? Authorizing Provider  albuterol (PROVENTIL HFA;VENTOLIN HFA) 108 (90 Base) MCG/ACT inhaler Inhale 2 puffs into the lungs every 4 (four) hours as needed for wheezing. 01/06/18  Yes Kathyrn Drown, MD  amoxicillin (AMOXIL) 875 MG tablet Take 1 tablet by mouth 2 (two) times a day. 02/23/19  Yes [provider]  aspirin EC 81 MG tablet Take 81 mg by mouth daily.   Yes [provider]  cholecalciferol (VITAMIN D) 400 units TABS tablet Take 400 Units by mouth daily.    Yes [provider]  fluticasone (FLONASE) 50 MCG/ACT nasal spray SPRAY 2 SPRAYS INTO EACH NOSTRIL ONCE DAILY 01/23/19  Yes Kathyrn Drown, MD  hydrochlorothiazide (HYDRODIURIL) 25 MG tablet Take 1 tablet (25 mg total) by mouth daily. 02/07/19  Yes Luking, Elayne Snare, MD  Polyethyl Glycol-Propyl Glycol (SYSTANE) 0.4-0.3 % SOLN Place 1-2 drops into both eyes 4 (four) times daily as needed.   Yes [provider]  potassium chloride (K-DUR) 10 MEQ tablet TAKE (2) TABLETS BY MOUTH ONCE DAILY. 02/07/19  Yes Luking, Elayne Snare, MD  rOPINIRole (REQUIP) 2 MG tablet TAKE 1 TABLET BY MOUTH AT LATE AFTERNOON AND TAKE ONE TABLET AT  BEDTIME Patient taking differently: Take 4 mg by mouth at bedtime. TAKE 1 TABLET BY MOUTH AT LATE AFTERNOON AND TAKE ONE TABLET AT BEDTIME 02/07/19  Yes Kathyrn Drown, MD  SYMBICORT 160-4.5 MCG/ACT inhaler INHALE 2 PUFFS BY MOUTH TWICE DAILY 11/16/18  Yes Luking, Elayne Snare, MD  tamsulosin (FLOMAX) 0.4 MG CAPS capsule Take 1 capsule (0.4 mg total) by mouth at bedtime. 02/07/19  Yes Kathyrn Drown, MD  vitamin C (ASCORBIC ACID) 500 MG tablet Take 500 mg by mouth daily.   Yes [provider]  Zinc Acetate, Oral, (ZINC ACETATE PO) Take 1 tablet by mouth daily.   Yes [provider]    Physical Exam: BP 109/71    Pulse (!) 43    Resp (!) 28    Ht 5\' 9"  (1.753 m)    Wt 81.6 kg    SpO2 95%    BMI 26.58 kg/m    General: Elderly Caucasian male. Awake and alert and oriented x3. No acute cardiopulmonary distress.   HEENT: Normocephalic atraumatic.  Right and left ears normal in appearance.  Pupils equal, round, reactive to light. Extraocular muscles are intact. Sclerae anicteric and noninjected.  Moist mucosal membranes. No mucosal lesions.   Neck: Neck supple without lymphadenopathy. No carotid bruits. No masses palpated.   Cardiovascular: Regular rate with normal S1-S2 sounds. No murmurs, rubs, gallops auscultated. No JVD.   Respiratory: Rales in the left base with diffuse rhonchi.  Wheezing.  No accessory muscle use.  Abdomen: Soft, nontender, nondistended. Active bowel sounds. No masses or hepatosplenomegaly   Skin: No rashes, lesions, or ulcerations.  Dry, warm to touch. 2+ dorsalis pedis and radial pulses.  Musculoskeletal: No calf or leg pain. All major joints not erythematous nontender.  No upper or lower joint deformation.  Good ROM.  No contractures   Psychiatric: Intact judgment and insight. Pleasant and cooperative.  Neurologic: No focal neurological deficits. Strength is 5/5 and symmetric in upper and lower extremities.  Cranial nerves II through XII are grossly  intact.           Labs on Admission: I have personally reviewed following labs and imaging studies  CBC: Recent Labs  Lab 02/23/19 1912  WBC 13.8*  HGB 18.5*  HCT 56.6*  MCV 95.8  PLT 573   Basic Metabolic Panel: Recent Labs  Lab 02/23/19 1912  NA 135  K 3.3*  CL 99  CO2 25  GLUCOSE 112*  BUN 16  CREATININE 1.63*  CALCIUM 8.8*  MG 2.1   GFR: Estimated Creatinine Clearance: 35.5 mL/min (A) (by C-G formula based on SCr of 1.63 mg/dL (H)). Liver Function Tests: No results for input(s): AST, ALT, ALKPHOS, BILITOT, PROT, ALBUMIN in the last 168 hours. No results for input(s): LIPASE, AMYLASE in the last 168 hours. No results for input(s): AMMONIA in the last 168 hours. Coagulation Profile: No results for input(s): INR, PROTIME in the last 168 hours. Cardiac Enzymes:  No results for input(s): CKTOTAL, CKMB, CKMBINDEX, TROPONINI in the last 168 hours. BNP (last 3 results) No results for input(s): PROBNP in the last 8760 hours. HbA1C: No results for input(s): HGBA1C in the last 72 hours. CBG: No results for input(s): GLUCAP in the last 168 hours. Lipid Profile: No results for input(s): CHOL, HDL, LDLCALC, TRIG, CHOLHDL, LDLDIRECT in the last 72 hours. Thyroid Function Tests: No results for input(s): TSH, T4TOTAL, FREET4, T3FREE, THYROIDAB in the last 72 hours. Anemia Panel: No results for input(s): VITAMINB12, FOLATE, FERRITIN, TIBC, IRON, RETICCTPCT in the last 72 hours. Urine analysis:    Component Value Date/Time   COLORURINE YELLOW (A) 02/22/2017 1314   APPEARANCEUR CLEAR (A) 02/22/2017 1314   LABSPEC 1.010 02/22/2017 1314   PHURINE 5.0 02/22/2017 1314   GLUCOSEU NEGATIVE 02/22/2017 1314   HGBUR MODERATE (A) 02/22/2017 1314   BILIRUBINUR NEGATIVE 02/22/2017 1314   KETONESUR NEGATIVE 02/22/2017 1314   PROTEINUR NEGATIVE 02/22/2017 1314   NITRITE NEGATIVE 02/22/2017 1314   LEUKOCYTESUR NEGATIVE 02/22/2017 1314   Sepsis  Labs: @LABRCNTIP (procalcitonin:4,lacticidven:4) )No results found for this or any previous visit (from the past 240 hour(s)).   Radiological Exams on Admission: Dg Chest Port 1 View  Result Date: 02/23/2019 CLINICAL DATA:  Dizziness, weakness, new arrhythmia EXAM: PORTABLE CHEST 1 VIEW COMPARISON:  11/22/2017 FINDINGS: Airspace opacity at the left lung base could reflect atelectasis or infiltrate. Right lung clear. Heart is normal size. No effusions or acute bony abnormality. IMPRESSION: Left basilar atelectasis or infiltrate. Electronically Signed   By: Rolm Baptise M.D.   On: 02/23/2019 19:16    EKG: Independently reviewed.  A. fib with rapid ventricular rates.  Assessment/Plan: Principal Problem:   Atrial fibrillation with RVR (HCC) Active Problems:   HTN (hypertension), benign   BPH (benign prostatic hyperplasia)   Smoker   Community acquired pneumonia    This patient was discussed with the ED physician, including pertinent vitals, physical exam findings, labs, and imaging.  We also discussed care given by the ED provider.  1. Atrial fibrillation with RVR a. Observation in stepdown b. Continue Cardizem drip as patient will have brief episodes of tachycardia c. Echocardiogram tomorrow d. Patient has chads 2 vascular score of 3.  I discussed starting anticoagulation with patient, which he declined as he has a fear of bleeding.  I discussed with him that his rate of stroke is about 3 %/year. e. Start full aspirin f. TSH 2. Community-acquired pneumonia Antibiotics: Levaquin Robitussin Blood cultures drawn in the emergency department Sputum cultures CBC tomorrow Strep antigen by urine COVID, respiratory virus influenza screen Check procalcitonin 3. Hypertension a. Will likely need to be changed over to Cardizem 4. BPH a. Continue Flomax 5. Smoker  DVT prophylaxis: Lovenox Consultants: None Code Status: Full code Family Communication: None Disposition Plan:  Home   [place], Tanna Savoy, DO

## 2019-02-23 NOTE — ED Provider Notes (Signed)
[facility] EMERGENCY DEPARTMENT Provider Note   CSN: [id number] Arrival date & time: 02/23/19  1744    History   Chief Complaint Chief Complaint  Patient presents with  . Weakness  . Tachycardia    HPI Gerald Hall is a 84 y.o. male.     HPI  Patient presents with concern of fatigue, diarrhea, palpitations. Patient has no history of arrhythmia. He notes that he recently had one tooth pulled, has had some unsettled stomach, today and after taking amoxicillin has had episodes of diarrhea, worsening weakness. Patient had a minor fall due to weakness, but no loss of consciousness, and currently denies any specific pain, including head or neck or chest discomfort. He does, however have some generalized discomfort, weakness, again without focality. EMS reports that the patient was hypotensive on arrival with tachycardia Patient received approximately 900 mL fluid resuscitation, multiple doses of adenosine without sustained change in his cardiac rhythm. My exam the patient is awake and alert, stating he feels poorly in general, though again without specific discomfort.  Past Medical History:  Diagnosis Date  . Arthritis   . Balance problem    uses a walker  . Blood in urine    Sees urology yearly  . Deafness in right ear age 88  . Hyperlipidemia   . Hypertension   . Temporal arteritis ([place]) 2016  . Urinary incontinence     Patient Active Problem List   Diagnosis Date Noted  . Avascular necrosis of bone of right hip ([place]) 02/23/2017  . Primary osteoarthritis involving multiple joints 02/21/2017  . Smoker 02/21/2017  . Macular degeneration 11/10/2016  . Insomnia 07/20/2016  . Restless legs 07/20/2016  . Spinal stenosis in cervical region 10/14/2015  . Senile purpura ([place]) 04/18/2015  . Osteopenia 01/28/2015  . Spinal stenosis of lumbar region 01/20/2015  . BPH (benign prostatic hyperplasia) 01/20/2015  . Diverticulosis of colon without hemorrhage   . Encounter for  screening colonoscopy 11/21/2014  . HTN (hypertension), benign 04/17/2014  . Temporal arteritis ([place]) 04/17/2014    Past Surgical History:  Procedure Laterality Date  . BACK SURGERY  2010  . CATARACT EXTRACTION Bilateral 07/30/14, 08/13/2014  . CERVICAL SPINE SURGERY  09/01/15   C 4-5 , [place]  . COLONOSCOPY  12/19/2007   RMR: 1. External hemorrohids, otherwise normal rectum.2. Normal colon. 3. Normal terminal ileum.  . COLONOSCOPY N/A 12/13/2014   Procedure: COLONOSCOPY;  Surgeon: Daneil Dolin, MD;  Location: [facility];  Service: Endoscopy;  Laterality: N/A;  11:15 Pt Request Time  . KNEE SURGERY Left    around 2000, arthroscopic  . TOTAL HIP ARTHROPLASTY Right 02/23/2017   Procedure: TOTAL HIP ARTHROPLASTY ANTERIOR APPROACH;  Surgeon: Hessie Knows, MD;  Location: [facility] ORS;  Service: Orthopedics;  Laterality: Right;        Home Medications    Prior to Admission medications   Medication Sig Start Date End Date Taking? Authorizing Provider  Acetaminophen (TYLENOL) 325 MG CAPS Take by mouth.    [provider]  albuterol (PROVENTIL HFA;VENTOLIN HFA) 108 (90 Base) MCG/ACT inhaler Inhale 2 puffs into the lungs every 4 (four) hours as needed for wheezing. 01/06/18   Kathyrn Drown, MD  amoxicillin (AMOXIL) 875 MG tablet Take 1 tablet by mouth 2 (two) times a day. 02/23/19   [provider]  aspirin EC 81 MG tablet Take 81 mg by mouth daily.    [provider]  cholecalciferol (VITAMIN D) 400 units TABS  tablet Take 400 Units by mouth.    [provider]  fluticasone (FLONASE) 50 MCG/ACT nasal spray SPRAY 2 SPRAYS INTO EACH NOSTRIL ONCE DAILY 01/23/19   Kathyrn Drown, MD  hydrochlorothiazide (HYDRODIURIL) 25 MG tablet Take 1 tablet (25 mg total) by mouth daily. 02/07/19   Kathyrn Drown, MD  potassium chloride (K-DUR) 10 MEQ tablet TAKE (2) TABLETS BY MOUTH ONCE DAILY. 02/07/19   Kathyrn Drown, MD  rOPINIRole (REQUIP) 2 MG tablet TAKE  1 TABLET BY MOUTH AT LATE AFTERNOON AND TAKE ONE TABLET AT BEDTIME 02/07/19   Luking, Scott A, MD  senna-docusate (SENOKOT-S) 8.6-50 MG tablet Take 2 tablets by mouth daily.    [provider]  SYMBICORT 160-4.5 MCG/ACT inhaler INHALE 2 PUFFS BY MOUTH TWICE DAILY 11/16/18   Kathyrn Drown, MD  tamsulosin (FLOMAX) 0.4 MG CAPS capsule Take 1 capsule (0.4 mg total) by mouth at bedtime. 02/07/19   Kathyrn Drown, MD    Family History Family History  Problem Relation Age of Onset  . Cancer Brother        colon  . Diabetes Brother   . Diabetes Brother     Social History Social History   Tobacco Use  . Smoking status: Current Every Day Smoker    Packs/day: 0.50    Types: Cigarettes  . Smokeless tobacco: Never Used  . Tobacco comment: 08/18/15 1/2 - 3/4 pack cigarettes daily  Substance Use Topics  . Alcohol use: Yes    Alcohol/week: 0.0 standard drinks    Comment: mixed drink 1-2 times per month, social  . Drug use: No     Allergies   Acyclovir and related   Review of Systems Review of Systems  Constitutional:       Per HPI, otherwise negative  HENT:       Per HPI, otherwise negative  Respiratory:       Per HPI, otherwise negative  Cardiovascular:       Per HPI, otherwise negative  Gastrointestinal: Positive for diarrhea and nausea. Negative for abdominal pain and vomiting.  Endocrine:       Negative aside from HPI  Genitourinary:       Neg aside from HPI   Musculoskeletal:       Per HPI, otherwise negative  Skin: Negative.   Neurological: Positive for weakness. Negative for syncope.     Physical Exam Updated Vital Signs BP 104/60   Pulse 94   Resp (!) 26   Ht 5\' 9"  (1.753 m)   Wt 81.6 kg   SpO2 96%   BMI 26.58 kg/m   Physical Exam Vitals signs and nursing note reviewed.  Constitutional:      Appearance: He is well-developed.     Comments: Elderly male awake and alert speaking clearly  HENT:     Head: Normocephalic and atraumatic.  Eyes:      Conjunctiva/sclera: Conjunctivae normal.  Cardiovascular:     Rate and Rhythm: Tachycardia present. Rhythm irregular.  Pulmonary:     Effort: Pulmonary effort is normal.     Comments: Diminished breath sounds, with coarseness, right Abdominal:     General: There is no distension.  Skin:    General: Skin is warm and dry.  Neurological:     Mental Status: He is alert and oriented to person, place, and time.  Psychiatric:        Mood and Affect: Mood normal.        Behavior: Behavior normal.  ED Treatments / Results  Labs (all labs ordered are listed, but only abnormal results are displayed) Labs Reviewed  BASIC METABOLIC PANEL  MAGNESIUM  CBC    EKG EKG Interpretation  Date/Time:  Friday Feb 23 2019 18:07:23 EDT Ventricular Rate:  144 PR Interval:    QRS Duration: 109 QT Interval:  326 QTC Calculation: 507 R Axis:   108 Text Interpretation:  Atrial fibrillation with rapid V-rate Right axis deviation Low voltage, precordial leads Repolarization abnormality, prob rate related Abnormal ekg Confirmed by Carmin Muskrat [phone]) on 02/23/2019 6:21:29 PM   Radiology Dg Chest Port 1 View  Result Date: 02/23/2019 CLINICAL DATA:  Dizziness, weakness, new arrhythmia EXAM: PORTABLE CHEST 1 VIEW COMPARISON:  11/22/2017 FINDINGS: Airspace opacity at the left lung base could reflect atelectasis or infiltrate. Right lung clear. Heart is normal size. No effusions or acute bony abnormality. IMPRESSION: Left basilar atelectasis or infiltrate. Electronically Signed   By: Rolm Baptise M.D.   On: 02/23/2019 19:16    Procedures Procedures (including critical care time)  Medications Ordered in ED Medications  0.9 %  sodium chloride infusion (has no administration in time range)  diltiazem (CARDIZEM) 1 mg/mL load via infusion 10 mg (10 mg Intravenous Bolus from Bag 02/23/19 1839)    And  diltiazem (CARDIZEM) 100 mg in dextrose 5 % 100 mL (1 mg/mL) infusion (7.5 mg/hr Intravenous Rate/Dose  Change 02/23/19 1829)     Initial Impression / Assessment and Plan / ED Course  I have reviewed the triage vital signs and the nursing notes.  Pertinent labs & imaging results that were available during my care of the patient were reviewed by me and considered in my medical decision making (see chart for details).    Immediately after arrival I reviewed the patient's EMS rhythm strips, notable for persistent tachycardia, and after reported dosing of adenosine patient had a brief pause, with what looks like regular rhythm, though with substantial artifact. However, the patient soon return his tachycardia.    Concern for atrial fibrillation, rapid ventricular response, this patient who has no history of this, patient will start Cardizem, while initial differential including infectious, metabolic etiologies are considered.  7:25 PM Heart rate now sinus, rate 90s.  Patient remains in similar condition, no hypotension. I discussed findings thus far with him, and indication for initiation of anticoagulant. Patient defers this recommendation, states that he is concern of bleeding risk per He is currently taking 81 mg aspirin, and is amenable to increasing this to full strength aspirin, however.  This patients CHA2DS2-VASc Score and unadjusted Ischemic Stroke Rate (% per year) is equal to 4.8 % stroke rate/year from a score of 4  Above score calculated as 1 point each if present [CHF, HTN, DM, Vascular=MI/PAD/Aortic Plaque, Age if 65-74, or Male] Above score calculated as 2 points each if present [Age > 75, or Stroke/TIA/TE]  7:47 PM Patient aware of all findings, including abnormal x-ray which I reviewed Patient now acknowledges ongoing cough and there is suspicion for pneumonia, likely contributing to his episode of new A. fib.  Patient will receive Levaquin. With pneumonia, leukocytosis, new A. fib, patient will be admitted for further monitoring, management.   Final Clinical  Impressions(s) / ED Diagnoses  Atrial fibrillation with rapid ventricular response Required pneumonia, left lower lobe  CRITICAL CARE Performed by: Carmin Muskrat Total critical care time: 35 minutes Critical care time was exclusive of separately billable procedures and treating other patients. Critical care was necessary to treat or  prevent imminent or life-threatening deterioration. Critical care was time spent personally by me on the following activities: development of treatment plan with patient and/or surrogate as well as nursing, discussions with consultants, evaluation of patient's response to treatment, examination of patient, obtaining history from patient or surrogate, ordering and performing treatments and interventions, ordering and review of laboratory studies, ordering and review of radiographic studies, pulse oximetry and re-evaluation of patient's condition.    ED Discharge Orders         Ordered    Amb referral to [facility] Clinic     02/23/19 1818           Carmin Muskrat, MD 02/23/19 1948

## 2019-02-24 ENCOUNTER — Inpatient Hospital Stay (HOSPITAL_COMMUNITY): Payer: Medicare Other

## 2019-02-24 DIAGNOSIS — Z9841 Cataract extraction status, right eye: Secondary | ICD-10-CM | POA: Diagnosis not present

## 2019-02-24 DIAGNOSIS — E785 Hyperlipidemia, unspecified: Secondary | ICD-10-CM | POA: Diagnosis present

## 2019-02-24 DIAGNOSIS — I4891 Unspecified atrial fibrillation: Secondary | ICD-10-CM | POA: Diagnosis not present

## 2019-02-24 DIAGNOSIS — H9191 Unspecified hearing loss, right ear: Secondary | ICD-10-CM | POA: Diagnosis present

## 2019-02-24 DIAGNOSIS — J189 Pneumonia, unspecified organism: Secondary | ICD-10-CM | POA: Diagnosis present

## 2019-02-24 DIAGNOSIS — I959 Hypotension, unspecified: Secondary | ICD-10-CM | POA: Diagnosis present

## 2019-02-24 DIAGNOSIS — J181 Lobar pneumonia, unspecified organism: Secondary | ICD-10-CM | POA: Diagnosis not present

## 2019-02-24 DIAGNOSIS — Z79899 Other long term (current) drug therapy: Secondary | ICD-10-CM | POA: Diagnosis not present

## 2019-02-24 DIAGNOSIS — Z7951 Long term (current) use of inhaled steroids: Secondary | ICD-10-CM | POA: Diagnosis not present

## 2019-02-24 DIAGNOSIS — Z888 Allergy status to other drugs, medicaments and biological substances status: Secondary | ICD-10-CM | POA: Diagnosis not present

## 2019-02-24 DIAGNOSIS — M1990 Unspecified osteoarthritis, unspecified site: Secondary | ICD-10-CM | POA: Diagnosis present

## 2019-02-24 DIAGNOSIS — Z7982 Long term (current) use of aspirin: Secondary | ICD-10-CM | POA: Diagnosis not present

## 2019-02-24 DIAGNOSIS — Z96641 Presence of right artificial hip joint: Secondary | ICD-10-CM | POA: Diagnosis present

## 2019-02-24 DIAGNOSIS — Z809 Family history of malignant neoplasm, unspecified: Secondary | ICD-10-CM | POA: Diagnosis not present

## 2019-02-24 DIAGNOSIS — Z9842 Cataract extraction status, left eye: Secondary | ICD-10-CM | POA: Diagnosis not present

## 2019-02-24 DIAGNOSIS — H3530 Unspecified macular degeneration: Secondary | ICD-10-CM | POA: Diagnosis present

## 2019-02-24 DIAGNOSIS — N401 Enlarged prostate with lower urinary tract symptoms: Secondary | ICD-10-CM | POA: Diagnosis not present

## 2019-02-24 DIAGNOSIS — Z20828 Contact with and (suspected) exposure to other viral communicable diseases: Secondary | ICD-10-CM | POA: Diagnosis present

## 2019-02-24 DIAGNOSIS — N400 Enlarged prostate without lower urinary tract symptoms: Secondary | ICD-10-CM | POA: Diagnosis present

## 2019-02-24 DIAGNOSIS — G2581 Restless legs syndrome: Secondary | ICD-10-CM | POA: Diagnosis present

## 2019-02-24 DIAGNOSIS — I10 Essential (primary) hypertension: Secondary | ICD-10-CM | POA: Diagnosis not present

## 2019-02-24 DIAGNOSIS — I482 Chronic atrial fibrillation, unspecified: Secondary | ICD-10-CM | POA: Diagnosis present

## 2019-02-24 DIAGNOSIS — F17210 Nicotine dependence, cigarettes, uncomplicated: Secondary | ICD-10-CM | POA: Diagnosis present

## 2019-02-24 LAB — CBC
HCT: 47.5 % (ref 39.0–52.0)
Hemoglobin: 15.8 g/dL (ref 13.0–17.0)
MCH: 31.6 pg (ref 26.0–34.0)
MCHC: 33.3 g/dL (ref 30.0–36.0)
MCV: 95 fL (ref 80.0–100.0)
Platelets: 249 10*3/uL (ref 150–400)
RBC: 5 MIL/uL (ref 4.22–5.81)
RDW: 13.8 % (ref 11.5–15.5)
WBC: 11.2 10*3/uL — ABNORMAL HIGH (ref 4.0–10.5)
nRBC: 0 % (ref 0.0–0.2)

## 2019-02-24 LAB — ECHOCARDIOGRAM LIMITED
Height: 69 in
Weight: 2928 oz

## 2019-02-24 LAB — BASIC METABOLIC PANEL
Anion gap: 9 (ref 5–15)
BUN: 21 mg/dL (ref 8–23)
CO2: 23 mmol/L (ref 22–32)
Calcium: 8.2 mg/dL — ABNORMAL LOW (ref 8.9–10.3)
Chloride: 104 mmol/L (ref 98–111)
Creatinine, Ser: 1.45 mg/dL — ABNORMAL HIGH (ref 0.61–1.24)
GFR calc Af Amer: 52 mL/min — ABNORMAL LOW (ref >60)
GFR calc non Af Amer: 45 mL/min — ABNORMAL LOW (ref >60)
Glucose, Bld: 104 mg/dL — ABNORMAL HIGH (ref 70–99)
Potassium: 3.8 mmol/L (ref 3.5–5.1)
Sodium: 136 mmol/L (ref 135–145)

## 2019-02-24 LAB — PROCALCITONIN: Procalcitonin: 7.14 ng/mL

## 2019-02-24 LAB — LACTIC ACID, PLASMA: Lactic Acid, Venous: 2.3 mmol/L (ref 0.5–1.9)

## 2019-02-24 MED ORDER — POLYVINYL ALCOHOL 1.4 % OP SOLN: 1.0000 [drp] | Freq: Four times a day (QID) | OPHTHALMIC | Status: DC | PRN

## 2019-02-24 MED ORDER — DILTIAZEM HCL 30 MG PO TABS
30.0000 mg | ORAL_TABLET | Freq: Four times a day (QID) | ORAL | Status: DC
  Administered 2019-02-24 – 2019-02-25 (×5): 30 mg via ORAL
  Filled 2019-02-24 (×5): qty 1

## 2019-02-24 MED ORDER — ALBUTEROL SULFATE (2.5 MG/3ML) 0.083% IN NEBU: 2.5000 mg | INHALATION_SOLUTION | RESPIRATORY_TRACT | Status: DC | PRN

## 2019-02-24 MED ORDER — LEVOFLOXACIN 500 MG PO TABS
500.0000 mg | ORAL_TABLET | ORAL | Status: DC
  Administered 2019-02-25: 500 mg via ORAL
  Filled 2019-02-24: qty 1

## 2019-02-24 NOTE — ED Notes (Addendum)
Date and time results received: 02/24/19 0016   Test: Lactic Critical Value: 2.3  Name of Provider Notified: EDP [place]

## 2019-02-24 NOTE — Progress Notes (Signed)
*  PRELIMINARY RESULTS* Echocardiogram 2D Echocardiogram LIMITED has been performed.  Gerald Hall 02/24/2019, 11:14 AM

## 2019-02-24 NOTE — ED Notes (Signed)
Assisted patient to side of bed to urinate. Patient states "I'm having a bowel movement right now too." Patient had loose bowel movement in brief. Cleaned patient and changed brief. Assisted patient back to bed and covered with clean blankets.

## 2019-02-24 NOTE — Progress Notes (Signed)
PROGRESS NOTE    Gerald Hall  RDE:[id number]  DOB: 05-05-1935  DOA: 02/23/2019 PCP: Kathyrn Drown, MD   Brief Admission Hx: 84 year old male smoker with hypertension and hyperlipidemia presented with diarrhea cough shortness of breath and fatigue.  He was hypotensive and tachycardic and noted to be in A. fib with RVR.  MDM/Assessment & Plan:   1. Atrial fibrillation with RVR- patient converted to sinus rhythm after being on the IV Cardizem infusion.  An echocardiogram is pending.  The patient has been started on oral Cardizem and his heart rate is holding steady.  The patient has decided against full anticoagulation.  He says that he is very worried about bleeding.  He understands the stroke risk.  He is agreeable to taking full dose aspirin.  His TSH is within normal limits. 2. [place] acquired pneumonia-he has been treated with Levaquin and he is improving clinically.  Following blood and sputum cultures.  COVID testing negative.  3. Essential hypertension- he is being currently treated with oral Cardizem.  We will monitor and make adjustments to blood pressure as needed. 4. BPH-continue Flomax. 5. Tobacco abuse-he is counseled to discontinue all smoking.  DVT prophylaxis: Lovenox Code Status: Full Family Communication: Patient Disposition Plan: Home tomorrow if stable.   Consultants:    Procedures:    Antimicrobials:  Levofloxacin 5/1 >  Subjective: The patient reports that he is feeling better and he really wants to go home today.  He again says that he does not want to be fully anticoagulated.  He is worried about bleeding complications.  Objective: Vitals:   02/24/19 0838 02/24/19 0842 02/24/19 0846 02/24/19 1151  BP:   (!) 103/47 118/68  Pulse:   72 66  Resp:   (!) 21   Temp:   97.7 F (36.5 C) 98 F (36.7 C)  TempSrc:   Oral Oral  SpO2: 92% 92% 91% 98%  Weight:  83 kg    Height:  5\' 9"  (1.753 m)      Intake/Output Summary (Last 24 hours) at 02/24/2019  1417 Last data filed at 02/24/2019 0830 Gross per 24 hour  Intake -  Output 200 ml  Net -200 ml   Filed Weights   02/23/19 1805 02/24/19 0842  Weight: 81.6 kg 83 kg     REVIEW OF SYSTEMS  As per history otherwise all reviewed and reported negative  Exam:  General exam: Awake, alert, no distress, in no apparent distress. Respiratory system: Clear. No increased work of breathing. Cardiovascular system: S1 & S2 heard. No JVD, murmurs, gallops, clicks or pedal edema. Gastrointestinal system: Abdomen is nondistended, soft and nontender. Normal bowel sounds heard. Central nervous system: Alert and oriented. No focal neurological deficits. Extremities: no CCE.   Data Reviewed: Basic Metabolic Panel: Recent Labs  Lab 02/23/19 1912 02/24/19 0548  NA 135 136  K 3.3* 3.8  CL 99 104  CO2 25 23  GLUCOSE 112* 104*  BUN 16 21  CREATININE 1.63* 1.45*  CALCIUM 8.8* 8.2*  MG 2.1  --    Liver Function Tests: No results for input(s): AST, ALT, ALKPHOS, BILITOT, PROT, ALBUMIN in the last 168 hours. No results for input(s): LIPASE, AMYLASE in the last 168 hours. No results for input(s): AMMONIA in the last 168 hours. CBC: Recent Labs  Lab 02/23/19 1912 02/24/19 0548  WBC 13.8* 11.2*  HGB 18.5* 15.8  HCT 56.6* 47.5  MCV 95.8 95.0  PLT 283 249   Cardiac Enzymes: No results for input(s):  CKTOTAL, CKMB, CKMBINDEX, TROPONINI in the last 168 hours. CBG (last 3)  No results for input(s): GLUCAP in the last 72 hours. Recent Results (from the past 240 hour(s))  SARS Coronavirus 2 (CEPHEID- Performed in [place] hospital lab), Hosp Order     Status: None   Collection Time: 02/23/19  8:14 PM  Result Value Ref Range Status   SARS Coronavirus 2 NEGATIVE NEGATIVE Final    Comment: (NOTE) If result is NEGATIVE SARS-CoV-2 target nucleic acids are NOT DETECTED. The SARS-CoV-2 RNA is generally detectable in upper and lower  respiratory specimens during the acute phase of infection. The  lowest  concentration of SARS-CoV-2 viral copies this assay can detect is 250  copies / mL. A negative result does not preclude SARS-CoV-2 infection  and should not be used as the sole basis for treatment or other  patient management decisions.  A negative result may occur with  improper specimen collection / handling, submission of specimen other  than nasopharyngeal swab, presence of viral mutation(s) within the  areas targeted by this assay, and inadequate number of viral copies  (<250 copies / mL). A negative result must be combined with clinical  observations, patient history, and epidemiological information. If result is POSITIVE SARS-CoV-2 target nucleic acids are DETECTED. The SARS-CoV-2 RNA is generally detectable in upper and lower  respiratory specimens dur ing the acute phase of infection.  Positive  results are indicative of active infection with SARS-CoV-2.  Clinical  correlation with patient history and other diagnostic information is  necessary to determine patient infection status.  Positive results do  not rule out bacterial infection or co-infection with other viruses. If result is PRESUMPTIVE POSTIVE SARS-CoV-2 nucleic acids MAY BE PRESENT.   A presumptive positive result was obtained on the submitted specimen  and confirmed on repeat testing.  While 2019 novel coronavirus  (SARS-CoV-2) nucleic acids may be present in the submitted sample  additional confirmatory testing may be necessary for epidemiological  and / or clinical management purposes  to differentiate between  SARS-CoV-2 and other Sarbecovirus currently known to infect humans.  If clinically indicated additional testing with an alternate test  methodology [phone]) is advised. The SARS-CoV-2 RNA is generally  detectable in upper and lower respiratory sp ecimens during the acute  phase of infection. The expected result is Negative. Fact Sheet for Patients:  StrictlyIdeas.no  Fact Sheet for Healthcare Providers: BankingDealers.co.za This test is not yet approved or cleared by the [place] FDA and has been authorized for detection and/or diagnosis of SARS-CoV-2 by FDA under an Emergency Use Authorization (EUA).  This EUA will remain in effect (meaning this test can be used) for the duration of the COVID-19 declaration under Section 564(b)(1) of the Act, 21 U.S.C. section 360bbb-3(b)(1), unless the authorization is terminated or revoked sooner. Performed at [facility], [address] [place], [place] [address]   Culture, blood (routine x 2)     Status: None (Preliminary result)   Collection Time: 02/23/19  8:55 PM  Result Value Ref Range Status   Specimen Description BLOOD RIGHT HAND  Final   Special Requests   Final    BOTTLES DRAWN AEROBIC AND ANAEROBIC Blood Culture adequate volume   Culture   Final    NO GROWTH < 12 HOURS Performed at [facility], [address], [place], [place] [address]    Report Status PENDING  Incomplete  Culture, blood (routine x 2)     Status: None (Preliminary result)  Collection Time: 02/23/19  8:57 PM  Result Value Ref Range Status   Specimen Description BLOOD RIGHT ARM  Final   Special Requests   Final    BOTTLES DRAWN AEROBIC AND ANAEROBIC Blood Culture adequate volume   Culture   Final    NO GROWTH < 12 HOURS Performed at [facility], [address], [place], [place] [address]    Report Status PENDING  Incomplete     Studies: Dg Chest Port 1 View  Result Date: 02/23/2019 CLINICAL DATA:  Dizziness, weakness, new arrhythmia EXAM: PORTABLE CHEST 1 VIEW COMPARISON:  11/22/2017 FINDINGS: Airspace opacity at the left lung base could reflect atelectasis or infiltrate. Right lung clear. Heart is normal size. No effusions or acute bony abnormality. IMPRESSION: Left basilar atelectasis or infiltrate. Electronically Signed   By: Rolm Baptise M.D.   On: 02/23/2019 19:16     Scheduled Meds: .  aspirin EC  325 mg Oral Daily  . diltiazem  30 mg Oral Q6H  . enoxaparin (LOVENOX) injection  40 mg Subcutaneous Q24H  . fluticasone  2 spray Each Nare Daily  . [START ON 02/25/2019] levofloxacin  500 mg Oral Q24H  . mometasone-formoterol  2 puff Inhalation BID  . potassium chloride  40 mEq Oral BID  . rOPINIRole  4 mg Oral QHS  . tamsulosin  0.4 mg Oral QHS  . vitamin C  500 mg Oral Daily   Continuous Infusions:  Principal Problem:   Atrial fibrillation with RVR (HCC) Active Problems:   HTN (hypertension), benign   BPH (benign prostatic hyperplasia)   Smoker   Community acquired pneumonia   Atrial fibrillation ([place])  Time spent:   Irwin Brakeman, MD [facility] 02/24/2019, 2:17 PM    LOS: 0 days  How to contact the [facility] Attending or Consulting provider [place] or covering provider during after hours [place], for this patient?  1. Check the care team in [facility] and look for a) attending/consulting [facility] provider listed and b) the [facility] team listed 2. Log into [email or web address] and use [place]'s universal password to access. If you do not have the password, please contact the hospital operator. 3. Locate the [facility] provider you are looking for under [facility] and page to a number that you can be directly reached. 4. If you still have difficulty reaching the provider, please page the [facility] (Director on Call) for the Hospitalists listed on amion for assistance.

## 2019-02-24 NOTE — ED Notes (Signed)
Patient contact: Gerald Hall (daughter) POA   [phone]

## 2019-02-24 NOTE — ED Notes (Signed)
Update given to patient's daughter, Carola Rhine, with patient permission.

## 2019-02-24 NOTE — ED Notes (Signed)
ED TO INPATIENT HANDOFF REPORT  ED Nurse Name and Phone #: Lupita Dawn Name/Age/Gender Gerald Hall 84 y.o. male Room/Bed: APA15/APA15  Code Status   Code Status: Full Code  Home/SNF/Other Home Patient oriented to: self, place, time and situation Is this baseline? Yes   Triage Complete: Triage complete  Chief Complaint Weakness  Triage Note Ems reports pt called ems for c/o dizziness, generalized weakness, and diarrhea.  EMS says when they arrived pt was hypotensive, 88/60.  Reports pt had a tooth pulled Tuesday and started amoxicillin yesterday for infection.  Reports has had 2 episodes of diarrhea.  EMS gave approx 942ml bolus of nss.  HR fluctuated between 130 and 155 and reports bigeminal pvc's.  PVCs improved with o2 administration but did see some couplets.  EMS administered 6mg  adenosine pta for HR 155.  HR slowed briefly but increased to 145.  Pt alert and oriented.  Denies any pain at this time.     Allergies No Known Allergies  Level of Care/Admitting Diagnosis ED Disposition    ED Disposition Condition [place] Area: [facility] [[id number]]  Level of Care: Telemetry [5]  Covid Evaluation: N/A  Diagnosis: Atrial fibrillation ([place]) [427.31.ICD-9-CM]  Admitting Physician: Loree Fee  Attending Physician: Murlean Iba [[id number]]  Estimated length of stay: 3 - 4 days  Certification:: I certify this patient will need inpatient services for at least 2 midnights  PT Class (Do Not Modify): Inpatient [101]  PT Acc Code (Do Not Modify): Private [1]       B Medical/Surgery History Past Medical History:  Diagnosis Date  . Arthritis   . Balance problem    uses a walker  . Blood in urine    Sees urology yearly  . Deafness in right ear age 60  . Hyperlipidemia   . Hypertension   . Temporal arteritis ([place]) 2016  . Urinary incontinence    Past Surgical History:  Procedure Laterality Date  . BACK SURGERY  2010  . CATARACT  EXTRACTION Bilateral 07/30/14, 08/13/2014  . CERVICAL SPINE SURGERY  09/01/15   C 4-5 , [place]  . COLONOSCOPY  12/19/2007   RMR: 1. External hemorrohids, otherwise normal rectum.2. Normal colon. 3. Normal terminal ileum.  . COLONOSCOPY N/A 12/13/2014   Procedure: COLONOSCOPY;  Surgeon: Daneil Dolin, MD;  [facility];  Service: Endoscopy;  Laterality: N/A;  11:15 Pt Request Time  . KNEE SURGERY Left    around 2000, arthroscopic  . TOTAL HIP ARTHROPLASTY Right 02/23/2017   Procedure: TOTAL HIP ARTHROPLASTY ANTERIOR APPROACH;  Surgeon: Hessie Knows, MD;  Location: [facility] ORS;  Service: Orthopedics;  Laterality: Right;     A IV Location/Drains/Wounds Patient Lines/Drains/Airways Status   Active Line/Drains/Airways    Name:   Placement date:   Placement time:   Site:   Days:   Peripheral IV 02/23/19 Left Antecubital   02/23/19    1800    Antecubital   1   Peripheral IV 02/23/19 Right Antecubital   02/23/19    2051    Antecubital   1   Incision (Closed) 02/23/17 Hip Right   02/23/17    1218     731          Intake/Output Last 24 hours No intake or output data in the 24 hours ending 02/24/19 9741  Labs/Imaging Results for orders placed or performed during the hospital encounter of 02/23/19 (from the past  48 hour(s))  Basic metabolic panel     Status: Abnormal   Collection Time: 02/23/19  7:12 PM  Result Value Ref Range   Sodium 135 135 - 145 mmol/L   Potassium 3.3 (L) 3.5 - 5.1 mmol/L   Chloride 99 98 - 111 mmol/L   CO2 25 22 - 32 mmol/L   Glucose, Bld 112 (H) 70 - 99 mg/dL   BUN 16 8 - 23 mg/dL   Creatinine, Ser 1.63 (H) 0.61 - 1.24 mg/dL   Calcium 8.8 (L) 8.9 - 10.3 mg/dL   GFR calc non Af Amer 39 (L) >60 mL/min   GFR calc Af Amer 45 (L) >60 mL/min   Anion gap 11 5 - 15    Comment: Performed at [facility], [address] [place], [place] [address]  Magnesium     Status: None   Collection Time: 02/23/19  7:12 PM  Result Value Ref Range   Magnesium  2.1 1.7 - 2.4 mg/dL    Comment: Performed at [facility], [address], [place], [place] [address]  CBC     Status: Abnormal   Collection Time: 02/23/19  7:12 PM  Result Value Ref Range   WBC 13.8 (H) 4.0 - 10.5 K/uL   RBC 5.91 (H) 4.22 - 5.81 MIL/uL   Hemoglobin 18.5 (H) 13.0 - 17.0 g/dL   HCT 56.6 (H) 39.0 - 52.0 %   MCV 95.8 80.0 - 100.0 fL   MCH 31.3 26.0 - 34.0 pg   MCHC 32.7 30.0 - 36.0 g/dL   RDW 13.8 11.5 - 15.5 %   Platelets 283 150 - 400 K/uL   nRBC 0.0 0.0 - 0.2 %    Comment: Performed at [facility], [address] [place], [place] [address]  SARS Coronavirus 2 (CEPHEID- Performed in [place] hospital lab), Hosp Order     Status: None   Collection Time: 02/23/19  8:14 PM  Result Value Ref Range   SARS Coronavirus 2 NEGATIVE NEGATIVE    Comment: (NOTE) If result is NEGATIVE SARS-CoV-2 target nucleic acids are NOT DETECTED. The SARS-CoV-2 RNA is generally detectable in upper and lower  respiratory specimens during the acute phase of infection. The lowest  concentration of SARS-CoV-2 viral copies this assay can detect is 250  copies / mL. A negative result does not preclude SARS-CoV-2 infection  and should not be used as the sole basis for treatment or other  patient management decisions.  A negative result may occur with  improper specimen collection / handling, submission of specimen other  than nasopharyngeal swab, presence of viral mutation(s) within the  areas targeted by this assay, and inadequate number of viral copies  (<250 copies / mL). A negative result must be combined with clinical  observations, patient history, and epidemiological information. If result is POSITIVE SARS-CoV-2 target nucleic acids are DETECTED. The SARS-CoV-2 RNA is generally detectable in upper and lower  respiratory specimens dur ing the acute phase of infection.  Positive  results are indicative of active infection with SARS-CoV-2.  Clinical  correlation with patient history and  other diagnostic information is  necessary to determine patient infection status.  Positive results do  not rule out bacterial infection or co-infection with other viruses. If result is PRESUMPTIVE POSTIVE SARS-CoV-2 nucleic acids MAY BE PRESENT.   A presumptive positive result was obtained on the submitted specimen  and confirmed on repeat testing.  While 2019 novel coronavirus  (SARS-CoV-2) nucleic acids may be present in the  submitted sample  additional confirmatory testing may be necessary for epidemiological  and / or clinical management purposes  to differentiate between  SARS-CoV-2 and other Sarbecovirus currently known to infect humans.  If clinically indicated additional testing with an alternate test  methodology [phone]) is advised. The SARS-CoV-2 RNA is generally  detectable in upper and lower respiratory sp ecimens during the acute  phase of infection. The expected result is Negative. Fact Sheet for Patients:  StrictlyIdeas.no Fact Sheet for Healthcare Providers: BankingDealers.co.za This test is not yet approved or cleared by the [place] FDA and has been authorized for detection and/or diagnosis of SARS-CoV-2 by FDA under an Emergency Use Authorization (EUA).  This EUA will remain in effect (meaning this test can be used) for the duration of the COVID-19 declaration under Section 564(b)(1) of the Act, 21 U.S.C. section 360bbb-3(b)(1), unless the authorization is terminated or revoked sooner. Performed at [facility], [address] [place], [place] [address]   Culture, blood (routine x 2)     Status: None (Preliminary result)   Collection Time: 02/23/19  8:55 PM  Result Value Ref Range   Specimen Description BLOOD RIGHT HAND    Special Requests      BOTTLES DRAWN AEROBIC AND ANAEROBIC Blood Culture adequate volume   Culture      NO GROWTH < 12 HOURS Performed at [facility], [address] [place], [place]  [address]    Report Status PENDING   Procalcitonin - Baseline     Status: None   Collection Time: 02/23/19  8:56 PM  Result Value Ref Range   Procalcitonin 4.67 ng/mL    Comment:        Interpretation: PCT > 2 ng/mL: Systemic infection (sepsis) is likely, unless other causes are known. (NOTE)       Sepsis PCT Algorithm           Lower Respiratory Tract                                      Infection PCT Algorithm    ----------------------------     ----------------------------         PCT < 0.25 ng/mL                PCT < 0.10 ng/mL         Strongly encourage             Strongly discourage   discontinuation of antibiotics    initiation of antibiotics    ----------------------------     -----------------------------       PCT 0.25 - 0.50 ng/mL            PCT 0.10 - 0.25 ng/mL               OR       >80% decrease in PCT            Discourage initiation of                                            antibiotics      Encourage discontinuation           of antibiotics    ----------------------------     -----------------------------         PCT >=  0.50 ng/mL              PCT 0.26 - 0.50 ng/mL               AND       <80% decrease in PCT              Encourage initiation of                                             antibiotics       Encourage continuation           of antibiotics    ----------------------------     -----------------------------        PCT >= 0.50 ng/mL                  PCT > 0.50 ng/mL               AND         increase in PCT                  Strongly encourage                                      initiation of antibiotics    Strongly encourage escalation           of antibiotics                                     -----------------------------                                           PCT <= 0.25 ng/mL                                                 OR                                        > 80% decrease in PCT                                     Discontinue /  Do not initiate                                             antibiotics Performed at [facility], [address], [place], [place] [address]   Lactic acid, plasma     Status: None   Collection Time: 02/23/19  8:56 PM  Result Value Ref Range   Lactic Acid, Venous 1.8 0.5 - 1.9 mmol/L    Comment: Performed at [facility], [address], [place],  [address]  Culture, blood (routine x 2)  Status: None (Preliminary result)   Collection Time: 02/23/19  8:57 PM  Result Value Ref Range   Specimen Description BLOOD RIGHT ARM    Special Requests      BOTTLES DRAWN AEROBIC AND ANAEROBIC Blood Culture adequate volume   Culture      NO GROWTH < 12 HOURS Performed at [facility], [address] [place], [place] [address]    Report Status PENDING   TSH     Status: None   Collection Time: 02/23/19  8:57 PM  Result Value Ref Range   TSH 1.047 0.350 - 4.500 uIU/mL    Comment: Performed by a 3rd Generation assay with a functional sensitivity of <=0.01 uIU/mL. Performed at [facility], [address], [place], [place] [address]   Lactic acid, plasma     Status: Abnormal   Collection Time: 02/23/19 11:42 PM  Result Value Ref Range   Lactic Acid, Venous 2.3 (HH) 0.5 - 1.9 mmol/L    Comment: CRITICAL RESULT CALLED TO, READ BACK BY AND VERIFIED WITH: WALKER,T. AT 0015 ON 02/24/2019 BY EVA Performed at [facility], [address] [place], [place] [address]   Procalcitonin     Status: None   Collection Time: 02/24/19  5:48 AM  Result Value Ref Range   Procalcitonin 7.14 ng/mL    Comment:        Interpretation: PCT > 2 ng/mL: Systemic infection (sepsis) is likely, unless other causes are known. (NOTE)       Sepsis PCT Algorithm           Lower Respiratory Tract                                      Infection PCT Algorithm    ----------------------------     ----------------------------         PCT < 0.25 ng/mL                PCT < 0.10 ng/mL         Strongly encourage              Strongly discourage   discontinuation of antibiotics    initiation of antibiotics    ----------------------------     -----------------------------       PCT 0.25 - 0.50 ng/mL            PCT 0.10 - 0.25 ng/mL               OR       >80% decrease in PCT            Discourage initiation of                                            antibiotics      Encourage discontinuation           of antibiotics    ----------------------------     -----------------------------         PCT >= 0.50 ng/mL              PCT 0.26 - 0.50 ng/mL               AND       <80% decrease in PCT  Encourage initiation of                                             antibiotics       Encourage continuation           of antibiotics    ----------------------------     -----------------------------        PCT >= 0.50 ng/mL                  PCT > 0.50 ng/mL               AND         increase in PCT                  Strongly encourage                                      initiation of antibiotics    Strongly encourage escalation           of antibiotics                                     -----------------------------                                           PCT <= 0.25 ng/mL                                                 OR                                        > 80% decrease in PCT                                     Discontinue / Do not initiate                                             antibiotics Performed at [facility], [address], [place], [place] [address]   CBC     Status: Abnormal   Collection Time: 02/24/19  5:48 AM  Result Value Ref Range   WBC 11.2 (H) 4.0 - 10.5 K/uL   RBC 5.00 4.22 - 5.81 MIL/uL   Hemoglobin 15.8 13.0 - 17.0 g/dL   HCT 47.5 39.0 - 52.0 %   MCV 95.0 80.0 - 100.0 fL   MCH 31.6 26.0 - 34.0 pg   MCHC 33.3 30.0 - 36.0 g/dL   RDW 13.8 11.5 - 15.5 %   Platelets 249 150 - 400 K/uL   nRBC 0.0 0.0 - 0.2 %    Comment: Performed at [facility], [address]  [place], [place] [address]  Basic metabolic panel     Status: Abnormal   Collection Time: 02/24/19  5:48 AM  Result Value Ref Range   Sodium 136 135 - 145 mmol/L   Potassium 3.8 3.5 - 5.1 mmol/L   Chloride 104 98 - 111 mmol/L   CO2 23 22 - 32 mmol/L   Glucose, Bld 104 (H) 70 - 99 mg/dL   BUN 21 8 - 23 mg/dL   Creatinine, Ser 1.45 (H) 0.61 - 1.24 mg/dL   Calcium 8.2 (L) 8.9 - 10.3 mg/dL   GFR calc non Af Amer 45 (L) >60 mL/min   GFR calc Af Amer 52 (L) >60 mL/min   Anion gap 9 5 - 15    Comment: Performed at [facility], [address], [place], [place] [address]   Dg Chest Port 1 View  Result Date: 02/23/2019 CLINICAL DATA:  Dizziness, weakness, new arrhythmia EXAM: PORTABLE CHEST 1 VIEW COMPARISON:  11/22/2017 FINDINGS: Airspace opacity at the left lung base could reflect atelectasis or infiltrate. Right lung clear. Heart is normal size. No effusions or acute bony abnormality. IMPRESSION: Left basilar atelectasis or infiltrate. Electronically Signed   By: Rolm Baptise M.D.   On: 02/23/2019 19:16    Pending Labs Unresulted Labs (From admission, onward)    Start     Ordered   02/24/19 0500  Procalcitonin  Daily,   R     02/23/19 2027   02/23/19 2331  Culture, sputum-assessment  Once,   R     02/23/19 2330   02/23/19 2331  Gram stain  Once,   R     02/23/19 2330   02/23/19 2331  Strep pneumoniae urinary antigen  Once,   R     02/23/19 2330   02/23/19 2331  Respiratory Panel by PCR  (Respiratory virus panel with precautions)  Add-on,   R     02/23/19 2330          Vitals/Pain Today's Vitals   02/24/19 0600 02/24/19 0630 02/24/19 0700 02/24/19 0715  BP: (!) 108/49 (!) 119/52 (!) 118/54   Pulse: 65 72 66   Resp: 20 (!) 26 (!) 21   SpO2: 93% 92% 95%   Weight:      Height:      PainSc:    0-No pain    Isolation Precautions Droplet precaution  Medications Medications  potassium chloride SA (K-DUR) CR tablet 40 mEq (40 mEq Oral Given 02/23/19 2106)  tamsulosin (FLOMAX) capsule  0.4 mg (0.4 mg Oral Given 02/24/19 0031)  rOPINIRole (REQUIP) tablet 4 mg (4 mg Oral Given 02/24/19 0033)  vitamin C (ASCORBIC ACID) tablet 500 mg (has no administration in time range)  albuterol (VENTOLIN HFA) 108 (90 Base) MCG/ACT inhaler 2 puff (has no administration in time range)  fluticasone (FLONASE) 50 MCG/ACT nasal spray 2 spray (has no administration in time range)  mometasone-formoterol (DULERA) 200-5 MCG/ACT inhaler 2 puff (2 puffs Inhalation Not Given 02/24/19 0053)  Polyethyl Glycol-Propyl Glycol 0.4-0.3 % SOLN 1-2 drop (has no administration in time range)  enoxaparin (LOVENOX) injection 40 mg (40 mg Subcutaneous Given 02/24/19 0033)  levofloxacin (LEVAQUIN) tablet 750 mg (750 mg Oral Given 02/24/19 0740)  aspirin EC tablet 325 mg (325 mg Oral Given 02/24/19 0031)  diltiazem (CARDIZEM) tablet 30 mg (30 mg Oral Given 02/24/19 0613)  diltiazem (CARDIZEM) 1 mg/mL load via infusion 10 mg (10 mg Intravenous Bolus from Bag 02/23/19 1839)  levofloxacin (LEVAQUIN) IVPB 500 mg (0 mg Intravenous  Stopped 02/23/19 2325)    Mobility walks Low fall risk   Focused Assessments Cardiac Assessment Handoff:  Cardiac Rhythm: Normal sinus rhythm Lab Results  Component Value Date   CKTOTAL 64 08/06/2015   No results found for: DDIMER Does the Patient currently have chest pain? No     R Recommendations: See Admitting Provider Note  Report given to:   Additional Notes: History of smoking. Deafness in right ear since childhood. Retired Software engineer.

## 2019-02-24 NOTE — ED Notes (Signed)
Dr. Darrick Meigs contacted and advised to d/c stop Cardizem drip due to patient has converted to a NSR rhythm and patient was becoming hypotensive while on Cardizem drip. Dr. Darrick Meigs advised P.O. Cardizem.

## 2019-02-24 NOTE — ED Notes (Signed)
Patient lying in bed sleeping at this time. Equal rise and fall of chest noted. Patient remains on cardiac monitor.

## 2019-02-25 LAB — PROCALCITONIN: Procalcitonin: 3.26 ng/mL

## 2019-02-25 MED ORDER — CARVEDILOL 3.125 MG PO TABS
3.1250 mg | ORAL_TABLET | Freq: Two times a day (BID) | ORAL | Status: DC
  Administered 2019-02-25: 3.125 mg via ORAL
  Filled 2019-02-25: qty 1

## 2019-02-25 MED ORDER — LEVOFLOXACIN 500 MG PO TABS: 500.0000 mg | ORAL_TABLET | ORAL | 0 refills | Status: DC

## 2019-02-25 MED ORDER — CARVEDILOL 3.125 MG PO TABS: 3.1250 mg | ORAL_TABLET | Freq: Two times a day (BID) | ORAL | 0 refills | Status: DC

## 2019-02-25 MED ORDER — OMEPRAZOLE 20 MG PO CPDR: 20.0000 mg | DELAYED_RELEASE_CAPSULE | Freq: Every day | ORAL | 0 refills | Status: DC

## 2019-02-25 MED ORDER — ASPIRIN 325 MG PO TBEC: 325.0000 mg | DELAYED_RELEASE_TABLET | Freq: Every day | ORAL | 0 refills | Status: DC

## 2019-02-25 NOTE — Discharge Summary (Signed)
Physician Discharge Summary  Gerald Hall YIR:[id number] DOB: 1934-12-12 DOA: 02/23/2019  PCP: Kathyrn Drown, MD  Admit date: 02/23/2019 Discharge date: 02/25/2019  Admitted From: Home  Disposition: Home   Recommendations for Outpatient Follow-up:  1. Follow up with PCP in 1 weeks 2. Please establish care with cardiology in 2 weeks  Home Health: RN, PT   Discharge Condition: STABLE   CODE STATUS: FULL    Brief Hospitalization Summary: Please see all hospital notes, images, labs for full details of the hospitalization. Dr. Glenna Durand HPI: Gerald Hall is a 84 y.o. male with a history of tobacco abuse, hypertension, hyperlipidemia.  Patient's presents with fatigue, diarrhea, worsening cough and shortness of breath over the past week.  No palliating or provoking factors to his cough or shortness of breath.  His cough is nonproductive, but loose.  He smokes three fourths of a pack of cigarettes a day.  He was brought to the hospital by EMS who noticed that he was hypotensive with tachycardia.  He received 900 mL's of fluid with a couple doses of adenosine without change to his cardiac rhythm.  Emergency Department Course: Patient noted to be in atrial fibrillation with rapid ventricular rate.  Patient started on Cardizem.  Patient converted to sinus rhythm.  Brief Admission Hx: 84 year old male smoker with hypertension and hyperlipidemia presented with diarrhea cough shortness of breath and fatigue.  He was hypotensive and tachycardic and noted to be in A. fib with RVR.  MDM/Assessment & Plan:   1. Atrial fibrillation with RVR- patient converted to sinus rhythm after being on the IV Cardizem infusion.  An echocardiogram was done with findings of EF 40-45% with mild mitral/aortic valve thickening.  The patient has been started on oral carvedilol 3.125 mg BID.  The patient has decided against full anticoagulation.  He says that he is very worried about bleeding complications.  He  understands the stroke risk.  He is agreeable to taking full dose aspirin.  His TSH is within normal limits.  Pt was strongly advised to follow up with cardiologist.  Pt says he will speak with his PCP before being referred.  2. Community acquired pneumonia-he has been treated with Levaquin and he is improving clinically.  Following blood and sputum cultures.  COVID testing negative.  3. Essential hypertension- he has been started on coreg 3.125 mg BID.  Follow up with PCP.  4. BPH-continue Flomax. 5. Tobacco abuse-he is counseled to discontinue all smoking.  DVT prophylaxis: Lovenox Code Status: Full Family Communication: I spoke with his daughter and updated Disposition Plan: Home.  Discharge Diagnoses:  Principal Problem:   Atrial fibrillation with RVR ([place]) Active Problems:   HTN (hypertension), benign   BPH (benign prostatic hyperplasia)   Smoker   Community acquired pneumonia   Atrial fibrillation [facility])   Discharge Instructions: Discharge Instructions    Amb referral to [facility] Clinic   Complete by:  As directed    Call MD for:  extreme fatigue   Complete by:  As directed    Call MD for:  persistant dizziness or light-headedness   Complete by:  As directed    Call MD for:  persistant nausea and vomiting   Complete by:  As directed    Call MD for:  severe uncontrolled pain   Complete by:  As directed    Diet - low sodium heart healthy   Complete by:  As directed    Increase activity slowly   Complete by:  As  directed      Allergies as of 02/25/2019   No Known Allergies     Medication List    STOP taking these medications   amoxicillin 875 MG tablet Commonly known as:  AMOXIL   hydrochlorothiazide 25 MG tablet Commonly known as:  HYDRODIURIL   potassium chloride 10 MEQ tablet Commonly known as:  K-DUR     TAKE these medications   albuterol 108 (90 Base) MCG/ACT inhaler Commonly known as:  VENTOLIN HFA Inhale 2 puffs into the lungs every 4 (four) hours as  needed for wheezing.   aspirin 325 MG EC tablet Take 1 tablet (325 mg total) by mouth daily. Start taking on:  Feb 26, 2019 What changed:    medication strength  how much to take   carvedilol 3.125 MG tablet Commonly known as:  COREG Take 1 tablet (3.125 mg total) by mouth 2 (two) times daily with a meal for 30 days.   cholecalciferol 10 MCG (400 UNIT) Tabs tablet Commonly known as:  VITAMIN D3 Take 400 Units by mouth daily.   fluticasone 50 MCG/ACT nasal spray Commonly known as:  FLONASE SPRAY 2 SPRAYS INTO EACH NOSTRIL ONCE DAILY   levofloxacin 500 MG tablet Commonly known as:  LEVAQUIN Take 1 tablet (500 mg total) by mouth daily for 3 days. Start taking on:  Feb 26, 2019   rOPINIRole 2 MG tablet Commonly known as:  REQUIP TAKE 1 TABLET BY MOUTH AT LATE AFTERNOON AND TAKE ONE TABLET AT BEDTIME What changed:    how much to take  how to take this  when to take this   Symbicort 160-4.5 MCG/ACT inhaler Generic drug:  budesonide-formoterol INHALE 2 PUFFS BY MOUTH TWICE DAILY   Systane 0.4-0.3 % Soln Generic drug:  Polyethyl Glycol-Propyl Glycol Place 1-2 drops into both eyes 4 (four) times daily as needed.   tamsulosin 0.4 MG Caps capsule Commonly known as:  FLOMAX Take 1 capsule (0.4 mg total) by mouth at bedtime.   vitamin C 500 MG tablet Commonly known as:  ASCORBIC ACID Take 500 mg by mouth daily.   ZINC ACETATE PO Take 1 tablet by mouth daily.      Follow-up Information    Luking, Elayne Snare, MD. Schedule an appointment as soon as possible for a visit in 1 week(s).   Specialty:  Family Medicine Contact information: [place] [address] [phone]        Arnoldo Lenis, MD. Schedule an appointment as soon as possible for a visit in 2 week(s).   Specialty:  Cardiology Why:  Establish care Atrial Fibrillation Contact information: [place] [address] [phone]          No Known  Allergies Allergies as of 02/25/2019   No Known Allergies     Medication List    STOP taking these medications   amoxicillin 875 MG tablet Commonly known as:  AMOXIL   hydrochlorothiazide 25 MG tablet Commonly known as:  HYDRODIURIL   potassium chloride 10 MEQ tablet Commonly known as:  K-DUR     TAKE these medications   albuterol 108 (90 Base) MCG/ACT inhaler Commonly known as:  VENTOLIN HFA Inhale 2 puffs into the lungs every 4 (four) hours as needed for wheezing.   aspirin 325 MG EC tablet Take 1 tablet (325 mg total) by mouth daily. Start taking on:  Feb 26, 2019 What changed:    medication strength  how much to take  carvedilol 3.125 MG tablet Commonly known as:  COREG Take 1 tablet (3.125 mg total) by mouth 2 (two) times daily with a meal for 30 days.   cholecalciferol 10 MCG (400 UNIT) Tabs tablet Commonly known as:  VITAMIN D3 Take 400 Units by mouth daily.   fluticasone 50 MCG/ACT nasal spray Commonly known as:  FLONASE SPRAY 2 SPRAYS INTO EACH NOSTRIL ONCE DAILY   levofloxacin 500 MG tablet Commonly known as:  LEVAQUIN Take 1 tablet (500 mg total) by mouth daily for 3 days. Start taking on:  Feb 26, 2019   rOPINIRole 2 MG tablet Commonly known as:  REQUIP TAKE 1 TABLET BY MOUTH AT LATE AFTERNOON AND TAKE ONE TABLET AT BEDTIME What changed:    how much to take  how to take this  when to take this   Symbicort 160-4.5 MCG/ACT inhaler Generic drug:  budesonide-formoterol INHALE 2 PUFFS BY MOUTH TWICE DAILY   Systane 0.4-0.3 % Soln Generic drug:  Polyethyl Glycol-Propyl Glycol Place 1-2 drops into both eyes 4 (four) times daily as needed.   tamsulosin 0.4 MG Caps capsule Commonly known as:  FLOMAX Take 1 capsule (0.4 mg total) by mouth at bedtime.   vitamin C 500 MG tablet Commonly known as:  ASCORBIC ACID Take 500 mg by mouth daily.   ZINC ACETATE PO Take 1 tablet by mouth daily.       Procedures/Studies: Echocardiogram  02/24/19 IMPRESSIONS  1. The left ventricle has mildly reduced systolic function, with an ejection fraction of 45-50%. The cavity size was normal.  2. The right ventricle has normal systolc function. The cavity was mildly enlarged. There is no increase in right ventricular wall thickness.  3. The mitral valve is abnormal. Mild thickening of the mitral valve leaflet.  4. The tricuspid valve was grossly normal.  5. The aortic valve is tricuspid Mild thickening of the aortic valve Mild calcification of the aortic valve.  6. The aortic root is normal in size and structure.  7. The interatrial septum was not assessed.   Dg Chest Port 1 View  Result Date: 02/23/2019 CLINICAL DATA:  Dizziness, weakness, new arrhythmia EXAM: PORTABLE CHEST 1 VIEW COMPARISON:  11/22/2017 FINDINGS: Airspace opacity at the left lung base could reflect atelectasis or infiltrate. Right lung clear. Heart is normal size. No effusions or acute bony abnormality. IMPRESSION: Left basilar atelectasis or infiltrate. Electronically Signed   By: Rolm Baptise M.D.   On: 02/23/2019 19:16      Subjective: Pt says he feesl well.  He really wants to go home.  Again, he declined full anticoagulation, he says that he would take aspirin.  He also says that he is ready to go when his time comes.  He is not interested in stopping smoking at this time.   Discharge Exam: Vitals:   02/25/19 0738 02/25/19 0900  BP:    Pulse:    Resp:    Temp:    SpO2: 96% 92%   Vitals:   02/24/19 2102 02/25/19 0503 02/25/19 0738 02/25/19 0900  BP: 125/75 (!) 111/91    Pulse: 82 85    Resp:      Temp: 98.6 F (37 C) 98.4 F (36.9 C)    TempSrc: Oral Oral    SpO2: (!) 89% 97% 96% 92%  Weight:      Height:       General: Pt is alert, awake, not in acute distress Cardiovascular: RRR, S1/S2 +, no rubs, no gallops Respiratory: CTA bilaterally,  no wheezing, no rhonchi Abdominal: Soft, NT, ND, bowel sounds + Extremities: no edema, no cyanosis    The results of significant diagnostics from this hospitalization (including imaging, microbiology, ancillary and laboratory) are listed below for reference.     Microbiology: Recent Results (from the past 240 hour(s))  SARS Coronavirus 2 (CEPHEID- Performed in [place] hospital lab), Hosp Order     Status: None   Collection Time: 02/23/19  8:14 PM  Result Value Ref Range Status   SARS Coronavirus 2 NEGATIVE NEGATIVE Final    Comment: (NOTE) If result is NEGATIVE SARS-CoV-2 target nucleic acids are NOT DETECTED. The SARS-CoV-2 RNA is generally detectable in upper and lower  respiratory specimens during the acute phase of infection. The lowest  concentration of SARS-CoV-2 viral copies this assay can detect is 250  copies / mL. A negative result does not preclude SARS-CoV-2 infection  and should not be used as the sole basis for treatment or other  patient management decisions.  A negative result may occur with  improper specimen collection / handling, submission of specimen other  than nasopharyngeal swab, presence of viral mutation(s) within the  areas targeted by this assay, and inadequate number of viral copies  (<250 copies / mL). A negative result must be combined with clinical  observations, patient history, and epidemiological information. If result is POSITIVE SARS-CoV-2 target nucleic acids are DETECTED. The SARS-CoV-2 RNA is generally detectable in upper and lower  respiratory specimens dur ing the acute phase of infection.  Positive  results are indicative of active infection with SARS-CoV-2.  Clinical  correlation with patient history and other diagnostic information is  necessary to determine patient infection status.  Positive results do  not rule out bacterial infection or co-infection with other viruses. If result is PRESUMPTIVE POSTIVE SARS-CoV-2 nucleic acids MAY BE PRESENT.   A presumptive positive result was obtained on the submitted specimen  and confirmed  on repeat testing.  While 2019 novel coronavirus  (SARS-CoV-2) nucleic acids may be present in the submitted sample  additional confirmatory testing may be necessary for epidemiological  and / or clinical management purposes  to differentiate between  SARS-CoV-2 and other Sarbecovirus currently known to infect humans.  If clinically indicated additional testing with an alternate test  methodology [phone]) is advised. The SARS-CoV-2 RNA is generally  detectable in upper and lower respiratory sp ecimens during the acute  phase of infection. The expected result is Negative. Fact Sheet for Patients:  StrictlyIdeas.no Fact Sheet for Healthcare Providers: BankingDealers.co.za This test is not yet approved or cleared by the [place] FDA and has been authorized for detection and/or diagnosis of SARS-CoV-2 by FDA under an Emergency Use Authorization (EUA).  This EUA will remain in effect (meaning this test can be used) for the duration of the COVID-19 declaration under Section 564(b)(1) of the Act, 21 U.S.C. section 360bbb-3(b)(1), unless the authorization is terminated or revoked sooner. Performed at [facility], [address], [place], [place] [address]   Culture, blood (routine x 2)     Status: None (Preliminary result)   Collection Time: 02/23/19  8:55 PM  Result Value Ref Range Status   Specimen Description BLOOD RIGHT HAND  Final   Special Requests   Final    BOTTLES DRAWN AEROBIC AND ANAEROBIC Blood Culture adequate volume   Culture   Final    NO GROWTH 2 DAYS Performed at [facility], [address], [place], [place] [address]    Report Status PENDING  Incomplete  Culture, blood (routine x 2)     Status: None (Preliminary result)   Collection Time: 02/23/19  8:57 PM  Result Value Ref Range Status   Specimen Description BLOOD RIGHT ARM  Final   Special Requests   Final    BOTTLES DRAWN AEROBIC AND ANAEROBIC Blood Culture  adequate volume   Culture   Final    NO GROWTH 2 DAYS Performed at [facility], [address], [place], [place] [address]    Report Status PENDING  Incomplete     Labs: BNP (last 3 results) No results for input(s): BNP in the last 8760 hours. Basic Metabolic Panel: Recent Labs  Lab 02/23/19 1912 02/24/19 0548  NA 135 136  K 3.3* 3.8  CL 99 104  CO2 25 23  GLUCOSE 112* 104*  BUN 16 21  CREATININE 1.63* 1.45*  CALCIUM 8.8* 8.2*  MG 2.1  --    Liver Function Tests: No results for input(s): AST, ALT, ALKPHOS, BILITOT, PROT, ALBUMIN in the last 168 hours. No results for input(s): LIPASE, AMYLASE in the last 168 hours. No results for input(s): AMMONIA in the last 168 hours. CBC: Recent Labs  Lab 02/23/19 1912 02/24/19 0548  WBC 13.8* 11.2*  HGB 18.5* 15.8  HCT 56.6* 47.5  MCV 95.8 95.0  PLT 283 249   Cardiac Enzymes: No results for input(s): CKTOTAL, CKMB, CKMBINDEX, TROPONINI in the last 168 hours. BNP: Invalid input(s): POCBNP CBG: No results for input(s): GLUCAP in the last 168 hours. D-Dimer No results for input(s): DDIMER in the last 72 hours. Hgb A1c No results for input(s): HGBA1C in the last 72 hours. Lipid Profile No results for input(s): CHOL, HDL, LDLCALC, TRIG, CHOLHDL, LDLDIRECT in the last 72 hours. Thyroid function studies Recent Labs    02/23/19 2057  TSH 1.047   Anemia work up No results for input(s): VITAMINB12, FOLATE, FERRITIN, TIBC, IRON, RETICCTPCT in the last 72 hours. Urinalysis    Component Value Date/Time   COLORURINE YELLOW (A) 02/22/2017 1314   APPEARANCEUR CLEAR (A) 02/22/2017 1314   LABSPEC 1.010 02/22/2017 1314   PHURINE 5.0 02/22/2017 1314   GLUCOSEU NEGATIVE 02/22/2017 1314   HGBUR MODERATE (A) 02/22/2017 1314   BILIRUBINUR NEGATIVE 02/22/2017 1314   KETONESUR NEGATIVE 02/22/2017 1314   PROTEINUR NEGATIVE 02/22/2017 1314   NITRITE NEGATIVE 02/22/2017 1314   LEUKOCYTESUR NEGATIVE 02/22/2017 1314   Sepsis  Labs Invalid input(s): PROCALCITONIN,  WBC,  LACTICIDVEN Microbiology Recent Results (from the past 240 hour(s))  SARS Coronavirus 2 (CEPHEID- Performed in [place] hospital lab), Hosp Order     Status: None   Collection Time: 02/23/19  8:14 PM  Result Value Ref Range Status   SARS Coronavirus 2 NEGATIVE NEGATIVE Final    Comment: (NOTE) If result is NEGATIVE SARS-CoV-2 target nucleic acids are NOT DETECTED. The SARS-CoV-2 RNA is generally detectable in upper and lower  respiratory specimens during the acute phase of infection. The lowest  concentration of SARS-CoV-2 viral copies this assay can detect is 250  copies / mL. A negative result does not preclude SARS-CoV-2 infection  and should not be used as the sole basis for treatment or other  patient management decisions.  A negative result may occur with  improper specimen collection / handling, submission of specimen other  than nasopharyngeal swab, presence of viral mutation(s) within the  areas targeted by this assay, and inadequate number of viral copies  (<250 copies / mL). A negative result must be combined with  clinical  observations, patient history, and epidemiological information. If result is POSITIVE SARS-CoV-2 target nucleic acids are DETECTED. The SARS-CoV-2 RNA is generally detectable in upper and lower  respiratory specimens dur ing the acute phase of infection.  Positive  results are indicative of active infection with SARS-CoV-2.  Clinical  correlation with patient history and other diagnostic information is  necessary to determine patient infection status.  Positive results do  not rule out bacterial infection or co-infection with other viruses. If result is PRESUMPTIVE POSTIVE SARS-CoV-2 nucleic acids MAY BE PRESENT.   A presumptive positive result was obtained on the submitted specimen  and confirmed on repeat testing.  While 2019 novel coronavirus  (SARS-CoV-2) nucleic acids may be present in the submitted  sample  additional confirmatory testing may be necessary for epidemiological  and / or clinical management purposes  to differentiate between  SARS-CoV-2 and other Sarbecovirus currently known to infect humans.  If clinically indicated additional testing with an alternate test  methodology [phone] is advised. The SARS-CoV-2 RNA is generally  detectable in upper and lower respiratory sp ecimens during the acute  phase of infection. The expected result is Negative. Fact Sheet for Patients:  StrictlyIdeas.no Fact Sheet for Healthcare Providers: BankingDealers.co.za This test is not yet approved or cleared by the [place] FDA and has been authorized for detection and/or diagnosis of SARS-CoV-2 by FDA under an Emergency Use Authorization (EUA).  This EUA will remain in effect (meaning this test can be used) for the duration of the COVID-19 declaration under Section 564(b)(1) of the Act, 21 U.S.C. section 360bbb-3(b)(1), unless the authorization is terminated or revoked sooner. Performed at [facility], [address], [place], [place] [address]   Culture, blood (routine x 2)     Status: None (Preliminary result)   Collection Time: 02/23/19  8:55 PM  Result Value Ref Range Status   Specimen Description BLOOD RIGHT HAND  Final   Special Requests   Final    BOTTLES DRAWN AEROBIC AND ANAEROBIC Blood Culture adequate volume   Culture   Final    NO GROWTH 2 DAYS Performed at [facility], [address] [place], [place] [address]    Report Status PENDING  Incomplete  Culture, blood (routine x 2)     Status: None (Preliminary result)   Collection Time: 02/23/19  8:57 PM  Result Value Ref Range Status   Specimen Description BLOOD RIGHT ARM  Final   Special Requests   Final    BOTTLES DRAWN AEROBIC AND ANAEROBIC Blood Culture adequate volume   Culture   Final    NO GROWTH 2 DAYS Performed at [facility], [address], [place],  [place] [address]    Report Status PENDING  Incomplete   Time coordinating discharge: 32 minutes   SIGNED:  Irwin Brakeman, MD  [facility] 02/25/2019, 10:32 AM How to contact the [facility] Attending or Consulting provider [place] or covering provider during after hours [place], for this patient?  1. Check the care team in [facility] and look for a) attending/consulting [facility] provider listed and b) the [facility] team listed 2. Log into [email or web address] and use [place]'s universal password to access. If you do not have the password, please contact the hospital operator. 3. Locate the [facility] provider you are looking for under [facility] and page to a number that you can be directly reached. 4. If you still have difficulty reaching the provider, please page the [facility] (Director on Call) for the  Hospitalists listed on amion for assistance.

## 2019-02-25 NOTE — Discharge Instructions (Signed)
Aspirin and Your Heart ° °Aspirin is a medicine that prevents the cells in the blood that are used for clotting, called platelets, from sticking together. Aspirin can be used to help reduce the risk of blood clots, heart attacks, and other heart-related problems. °Can I take aspirin? °Your health care provider will help you determine whether it is safe and beneficial for you to take aspirin daily. Taking aspirin daily may be helpful if you: °· Have had a heart attack or chest pain. °· Are at risk for a heart attack. °· Have undergone open-heart surgery, such as coronary artery bypass surgery (CABG). °· Have had coronary angioplasty or a stent. °· Have had certain types of stroke or transient ischemic attack (TIA). °· Have peripheral artery disease (PAD). °· Have chronic heart rhythm problems such as atrial fibrillation and cannot take an anticoagulant. °· Have valve disease or have had surgery on a valve. °What are the risks? °Daily use of aspirin can cause side effects. Some of these include: °· Bleeding. Bleeding problems can be minor or serious. An example of a minor problem is a cut that does not stop bleeding. An example of a more serious problem is stomach bleeding or, rarely, bleeding into the brain. Your risk of bleeding is increased if you are also taking non-steroidal anti-inflammatory drugs (NSAIDs). °· Increased bruising. °· Upset stomach. °· An allergic reaction. People who have nasal polyps have an increased risk of developing an aspirin allergy. °General guidelines °· Take aspirin only as told by your health care provider. Make sure that you understand how much you should take and what form you should take. The two forms of aspirin are: °? Non-enteric-coated.This type of aspirin does not have a coating and is absorbed quickly. This type of aspirin also comes in a chewable form. °? Enteric-coated. This type of aspirin has a coating that releases the medicine very slowly. Enteric-coated aspirin might  cause less stomach upset than non-enteric-coated aspirin. This type of aspirin should not be chewed or crushed. °· Limit alcohol intake to no more than 1 drink a day for nonpregnant women and 2 drinks a day for men. Drinking alcohol increases your risk of bleeding. One drink equals 12 oz of beer, 5 oz of wine, or 1½ oz of hard liquor. °Contact a health care provider if you: °· Have unusual bleeding or bruising. °· Have stomach pain or nausea. °· Have ringing in your ears. °· Have an allergic reaction that causes: °? Hives. °? Itchy skin. °? Swelling of the lips, tongue, or face. °Get help right away if you: °· Notice that your bowel movements are bloody, dark red, or black in color. °· Vomit or cough up blood. °· Have blood in your urine. °· Cough, have noisy breathing (wheeze), or feel short of breath. °· Have chest pain, especially if the pain spreads to the arms, back, neck, or jaw. °· Have a severe headache, or a headache with confusion, or dizziness. °These symptoms may represent a serious problem that is an emergency. Do not wait to see if the symptoms will go away. Get medical help right away. Call your local emergency services (911 in the [place] Do not drive yourself to the hospital. °Summary °· Aspirin can be used to help reduce the risk of blood clots, heart attacks, and other heart-related problems. °· Daily use of aspirin can increase your risk of side effects. Your health care provider will help you determine whether it is safe and beneficial for you to   take aspirin daily.  Take aspirin only as told by your health care provider. Make sure that you understand how much you can take and what form you can take. This information is not intended to replace advice given to you by your health care provider. Make sure you discuss any questions you have with your health care provider. Document Released: 09/23/2008 Document Revised: 08/11/2017 Document Reviewed: 08/11/2017 Elsevier Interactive Patient  Education  2019 [employer]  Atrial Fibrillation  Atrial fibrillation is a type of heartbeat that is irregular or fast (rapid). If you have this condition, your heart beats without any order. This makes it hard for your heart to pump blood in a normal way. Having this condition gives you more risk for stroke, heart failure, and other heart problems. Atrial fibrillation may start all of a sudden and then stop on its own, or it may become a long-lasting problem. What are the causes? This condition may be caused by heart conditions, such as:  High blood pressure.  Heart failure.  Heart valve disease.  Heart surgery. Other causes include:  Pneumonia.  Obstructive sleep apnea.  Lung cancer.  Thyroid disease.  Drinking too much alcohol. Sometimes the cause is not known. What increases the risk? You are more likely to develop this condition if:  You smoke.  You are older.  You have diabetes.  You are overweight.  You have a family history of this condition.  You exercise often and hard. What are the signs or symptoms? Common symptoms of this condition include:  A feeling like your heart is beating very fast.  Chest pain.  Feeling short of breath.  Feeling light-headed or weak.  Getting tired easily. Follow these instructions at home: Medicines  Take over-the-counter and prescription medicines only as told by your doctor.  If your doctor gives you a blood-thinning medicine, take it exactly as told. Taking too much of it can cause bleeding. Taking too little of it does not protect you against clots. Clots can cause a stroke. Lifestyle      Do not use any tobacco products. These include cigarettes, chewing tobacco, and e-cigarettes. If you need help quitting, ask your doctor.  Do not drink alcohol.  Do not drink beverages that have caffeine. These include coffee, soda, and tea.  Follow diet instructions as told by your doctor.  Exercise regularly as  told by your doctor. General instructions  If you have a condition that causes breathing to stop for a short period of time (apnea), treat it as told by your doctor.  Keep a healthy weight. Do not use diet pills unless your doctor says they are safe for you. Diet pills may make heart problems worse.  Keep all follow-up visits as told by your doctor. This is important. Contact a doctor if:  You notice a change in the speed, rhythm, or strength of your heartbeat.  You are taking a blood-thinning medicine and you see more bruising.  You get tired more easily when you move or exercise.  You have a sudden change in weight. Get help right away if:   You have pain in your chest or your belly (abdomen).  You have trouble breathing.  You have blood in your vomit, poop, or pee (urine).  You have any signs of a stroke. "BE FAST" is an easy way to remember the main warning signs: ? B - Balance. Signs are dizziness, sudden trouble walking, or loss of balance. ? E - Eyes. Signs are trouble  seeing or a change in how you see. ? F - Face. Signs are sudden weakness or loss of feeling in the face, or the face or eyelid drooping on one side. ? A - Arms. Signs are weakness or loss of feeling in an arm. This happens suddenly and usually on one side of the body. ? S - Speech. Signs are sudden trouble speaking, slurred speech, or trouble understanding what people say. ? T - Time. Time to call emergency services. Write down what time symptoms started.  You have other signs of a stroke, such as: ? A sudden, very bad headache with no known cause. ? Feeling sick to your stomach (nausea). ? Throwing up (vomiting). ? Jerky movements you cannot control (seizure). These symptoms may be an emergency. Do not wait to see if the symptoms will go away. Get medical help right away. Call your local emergency services (911 in the [place] Do not drive yourself to the hospital. Summary  Atrial fibrillation is a type  of heartbeat that is irregular or fast (rapid).  You are at higher risk of this condition if you smoke, are older, have diabetes, or are overweight.  Follow your doctor's instructions about medicines, diet, exercise, and follow-up visits.  Get help right away if you think that you have signs of a stroke. This information is not intended to replace advice given to you by your health care provider. Make sure you discuss any questions you have with your health care provider. Document Released: 07/20/2008 Document Revised: 12/02/2017 Document Reviewed: 12/02/2017 Elsevier Interactive Patient Education  2019 [employer]   IMPORTANT INFORMATION: PAY CLOSE ATTENTION   PHYSICIAN DISCHARGE INSTRUCTIONS  Follow with Primary care provider  Kathyrn Drown, MD  and other consultants as instructed your Hospitalist Physician  SEEK MEDICAL CARE OR RETURN TO EMERGENCY ROOM IF SYMPTOMS COME BACK, WORSEN OR NEW PROBLEM DEVELOPS.   Please note: You were cared for by a hospitalist during your hospital stay. Every effort will be made to forward records to your primary care provider.  You can request that your primary care provider send for your hospital records if they have not received them.  Once you are discharged, your primary care physician will handle any further medical issues. Please note that NO REFILLS for any discharge medications will be authorized once you are discharged, as it is imperative that you return to your primary care physician (or establish a relationship with a primary care physician if you do not have one) for your post hospital discharge needs so that they can reassess your need for medications and monitor your lab values.  Please get a complete blood count and chemistry panel checked by your Primary MD at your next visit, and again as instructed by your Primary MD.  Get Medicines reviewed and adjusted: Please take all your medications with you for your next visit with your Primary  MD  Laboratory/radiological data: Please request your Primary MD to go over all hospital tests and procedure/radiological results at the follow up, please ask your primary care provider to get all Hospital records sent to his/her office.  In some cases, they will be blood work, cultures and biopsy results pending at the time of your discharge. Please request that your primary care provider follow up on these results.  If you are diabetic, please bring your blood sugar readings with you to your follow up appointment with primary care.    Please call and make your follow up appointments as soon  as possible.    Also Note the following: If you experience worsening of your admission symptoms, develop shortness of breath, life threatening emergency, suicidal or homicidal thoughts you must seek medical attention immediately by calling 911 or calling your MD immediately  if symptoms less severe.  You must read complete instructions/literature along with all the possible adverse reactions/side effects for all the Medicines you take and that have been prescribed to you. Take any new Medicines after you have completely understood and accpet all the possible adverse reactions/side effects.   Do not drive when taking Pain medications or sleeping medications (Benzodiazepines)  Do not take more than prescribed Pain, Sleep and Anxiety Medications. It is not advisable to combine anxiety,sleep and pain medications without talking with your primary care practitioner  Special Instructions: If you have smoked or chewed Tobacco  in the last 2 yrs please stop smoking, stop any regular Alcohol  and or any Recreational drug use.  Wear Seat belts while driving.

## 2019-02-26 ENCOUNTER — Ambulatory Visit (INDEPENDENT_AMBULATORY_CARE_PROVIDER_SITE_OTHER): Payer: Medicare Other | Admitting: Family Medicine

## 2019-02-26 ENCOUNTER — Other Ambulatory Visit: Payer: Self-pay

## 2019-02-26 DIAGNOSIS — J181 Lobar pneumonia, unspecified organism: Secondary | ICD-10-CM | POA: Diagnosis not present

## 2019-02-26 DIAGNOSIS — J189 Pneumonia, unspecified organism: Secondary | ICD-10-CM

## 2019-02-26 DIAGNOSIS — I4891 Unspecified atrial fibrillation: Secondary | ICD-10-CM

## 2019-02-26 MED ORDER — CARVEDILOL 3.125 MG PO TABS: 3.1250 mg | ORAL_TABLET | Freq: Two times a day (BID) | ORAL | 3 refills | Status: DC

## 2019-02-26 NOTE — Progress Notes (Signed)
   Subjective:    Patient ID: Gerald Hall, male    DOB: 06-14-1935, 84 y.o.   MRN: [id number] Video and audio Patient present at home I was present at the office Hospital follow-up Patient recently in the hospital for 3 days with pneumonia as well as rapid ventricular response with atrial fib HPI Pt started having Afib on Friday. Pt had diarrhea that day. Pt became dizzy when getting up; feel in kitchen trying to get to bathroom. Pt was in [place] from 02/23/2019-02/25/2019. Pt states he is feeling better today. Pt states he has not been getting sleep. Pt also states he did have pneumonia and is on Levaquin. Pt states he could not sleep last night due to coughing. Pt would like to know if he can take Mucinex DM. Pt was advised to see a cardiologist.   Virtual Visit via Video Note  I connected with [name] on 02/26/19 at  3:30 PM EDT by a video enabled telemedicine application and verified that I am speaking with the correct person using two identifiers.  Location: Patient: home Provider: office   I discussed the limitations of evaluation and management by telemedicine and the availability of in person appointments. The patient expressed understanding and agreed to proceed.  History of Present Illness:    Observations/Objective:   Assessment and Plan:   Follow Up Instructions:    I discussed the assessment and treatment plan with the patient. The patient was provided an opportunity to ask questions and all were answered. The patient agreed with the plan and demonstrated an understanding of the instructions.   The patient was advised to call back or seek an in-person evaluation if the symptoms worsen or if the condition fails to improve as anticipated.  I provided 18 minutes of non-face-to-face time during this encounter.   Vicente Males, LPN So in the past patient has been counseled to quit smoking but unlikely to do so because he just has a strong desire to keep  smoking  Review of Systems Currently relates a lot of coughing some rattling in the chest this been present since last week is currently on antibiotics feels somewhat better does not get out of breath is easy states he does not feel his heart is running fast denies chest tightness pressure pain shortness breath wheezing difficulty breathing    Objective:   Physical Exam Patient did not seem tachypneic on video exam       Assessment & Plan:  Recent pneumonia-follow-up chest x-ray within 3 weeks History of hospital stay with rapid ventricular response with atrial fib-patient does not want to go on anticoagulant currently.  He states he had problems with bleeding from his leg where he had a procedure completed recently He is willing to see cardiology and will continue his current medications.  Patient states he was taken off of HCTZ while in the hospital and placed on Coreg he is tolerating this well so far  We will do a follow-up visit with the patient in a few weeks time  Patient was told that if he is not improving with the cough and congestion over the next 2 to 3 days he needs to notify us  May need other intervention if worse  Referral to cardiology

## 2019-02-27 NOTE — Addendum Note (Signed)
Addended by: Vicente Males on: 02/27/2019 12:19 PM   Modules accepted: Orders

## 2019-02-27 NOTE — Progress Notes (Signed)
Referral placed.

## 2019-02-28 ENCOUNTER — Telehealth: Payer: Self-pay | Admitting: Cardiology

## 2019-02-28 ENCOUNTER — Encounter: Payer: Self-pay | Admitting: Cardiology

## 2019-02-28 LAB — CULTURE, BLOOD (ROUTINE X 2)
Culture: NO GROWTH
Culture: NO GROWTH
Special Requests: ADEQUATE
Special Requests: ADEQUATE

## 2019-02-28 NOTE — Progress Notes (Signed)
Virtual Visit via Video Note   This visit type was conducted due to national recommendations for restrictions regarding the COVID-19 Pandemic (e.g. social distancing) in an effort to limit this patient's exposure and mitigate transmission in our community.  Due to his co-morbid illnesses, this patient is at least at moderate risk for complications without adequate follow up.  This format is felt to be most appropriate for this patient at this time.  All issues noted in this document were discussed and addressed.  A limited physical exam was performed with this format.  Please refer to the patient's chart for his consent to telehealth for [facility].   Date:  03/01/2019   ID:  BRAYDYN Hall, DOB [birth year], MRN [id number]  Patient Location: Home Provider Location: Office  PCP:  Kathyrn Drown, MD  Evaluating Cardiologist: Satira Sark, MD  Evaluation Performed:  New Patient Evaluation  Chief Complaint:  Evaluate atrial fibrillation  History of Present Illness:    Gerald Hall is an 84 y.o. male referred for cardiology consultation by Dr. Wolfgang Phoenix for the evaluation of atrial fibrillation.  I reviewed his records. He was just recently hospitalized at [facility], presenting with fatigue and malaise, shortness of breath and cough, diagnosed with community-acquired pneumonia and also found to be in rapid atrial fibrillation.  With intravenous Cardizem infusion he spontaneously converted to sinus rhythm.  He was treated with Levaquin for pneumonia, SARS coronavirus 2 testing was negative.  He did undergo an echocardiogram which revealed LVEF 45 to 50% and was started on carvedilol at low dose.  He also elected not to initiate anticoagulation.  CHADSVASC score is 4.  We discussed the issue of anticoagulation again for stroke prophylaxis.  He is not necessarily opposed to it, I however he has been concerned about some poorly healing skin wounds on his legs that have been bleeding  recently.  These are starting to settle down however.  He does not report any palpitations since hospital discharge.  Breathing is getting back to baseline.  He is sleeping much better.  Reports stable appetite.  We went over his medications which are outlined below.  He is tolerating Coreg.  We went over several recent blood pressure and heart rate measurements as listed below.  He also tells me that his frequent urination has improved since stopping HCTZ.  Records indicate previous preoperative cardiac evaluation by Dr. Rockey Situ back in April 2018 prior to anticipated total right hip replacement, no further cardiac testing was undertaken at that point.  The patient does not have symptoms concerning for COVID-19 infection (fever, chills, cough, or new shortness of breath).  He states that he has been social distancing.   Past Medical History:  Diagnosis Date   Arthritis    Balance problem    Uses a walker   Blood in urine    Sees urology yearly   Deafness in right ear age 61   Hyperlipidemia    Hypertension    Temporal arteritis ([place]) 2016   Urinary incontinence    Past Surgical History:  Procedure Laterality Date   BACK SURGERY  2010   CATARACT EXTRACTION Bilateral 07/30/14, 08/13/2014   CERVICAL SPINE SURGERY  09/01/15   C 4-5 , [place]   COLONOSCOPY  12/19/2007   RMR: 1. External hemorrohids, otherwise normal rectum.2. Normal colon. 3. Normal terminal ileum.   COLONOSCOPY N/A 12/13/2014   Procedure: COLONOSCOPY;  Surgeon: Daneil Dolin, MD;  Location: [facility];  Service: Endoscopy;  Laterality: N/A;  11:15 Pt Request Time   KNEE SURGERY Left    around 2000, arthroscopic   TOTAL HIP ARTHROPLASTY Right 02/23/2017   Procedure: TOTAL HIP ARTHROPLASTY ANTERIOR APPROACH;  Surgeon: Hessie Knows, MD;  Location: [facility] ORS;  Service: Orthopedics;  Laterality: Right;     Current Meds  Medication Sig   albuterol (PROVENTIL HFA;VENTOLIN HFA) 108 (90 Base)  MCG/ACT inhaler Inhale 2 puffs into the lungs every 4 (four) hours as needed for wheezing.   aspirin EC 325 MG EC tablet Take 1 tablet (325 mg total) by mouth daily.   carvedilol (COREG) 3.125 MG tablet Take 1 tablet (3.125 mg total) by mouth 2 (two) times daily with a meal for 30 days.   cholecalciferol (VITAMIN D) 400 units TABS tablet Take 400 Units by mouth daily.    Dextromethorphan-guaiFENesin (MUCINEX DM) 30-600 MG TB12 Take 1 tablet by mouth 2 (two) times a day.   fluticasone (FLONASE) 50 MCG/ACT nasal spray SPRAY 2 SPRAYS INTO EACH NOSTRIL ONCE DAILY   rOPINIRole (REQUIP) 2 MG tablet TAKE 1 TABLET BY MOUTH AT LATE AFTERNOON AND TAKE ONE TABLET AT BEDTIME (Patient taking differently: Take 4 mg by mouth at bedtime. TAKE 1 TABLET BY MOUTH AT LATE AFTERNOON AND TAKE ONE TABLET AT BEDTIME)   SYMBICORT 160-4.5 MCG/ACT inhaler INHALE 2 PUFFS BY MOUTH TWICE DAILY   tamsulosin (FLOMAX) 0.4 MG CAPS capsule Take 1 capsule (0.4 mg total) by mouth at bedtime.   vitamin C (ASCORBIC ACID) 500 MG tablet Take 500 mg by mouth daily.   Zinc Acetate, Oral, (ZINC ACETATE PO) Take 1 tablet by mouth daily.     Allergies:   Patient has no known allergies.   Social History   Tobacco Use   Smoking status: Current Every Day Smoker    Packs/day: 0.50    Types: Cigarettes   Smokeless tobacco: Never Used   Tobacco comment: 08/18/15 1/2 - 3/4 pack cigarettes daily  Substance Use Topics   Alcohol use: Yes    Alcohol/week: 0.0 standard drinks    Comment: mixed drink 1-2 times per month, social   Drug use: No     Family Hx: The patient's family history includes Colon cancer in his brother; Diabetes in his brother and brother.  ROS:   Please see the history of present illness.    Frequent urination with incontinence. All other systems reviewed and are negative.   Prior CV studies:   The following studies were reviewed today:  Echocardiogram 02/24/2019:  1. The left ventricle has mildly  reduced systolic function, with an ejection fraction of 45-50%. The cavity size was normal.  2. The right ventricle has normal systolc function. The cavity was mildly enlarged. There is no increase in right ventricular wall thickness.  3. The mitral valve is abnormal. Mild thickening of the mitral valve leaflet.  4. The tricuspid valve was grossly normal.  5. The aortic valve is tricuspid Mild thickening of the aortic valve Mild calcification of the aortic valve.  6. The aortic root is normal in size and structure.  7. The interatrial septum was not assessed.  Labs/Other Tests and Data Reviewed:    EKG:  An ECG dated 02/24/2019 was personally reviewed today and demonstrated:  Sinus rhythm with prolonged PR interval, low voltage, nonspecific T wave changes.  Recent Labs: 02/23/2019: Magnesium 2.1; TSH 1.047 02/24/2019: BUN 21; Creatinine, Ser 1.45; Hemoglobin 15.8; Platelets 249; Potassium 3.8; Sodium 136   Recent Lipid Panel Lab  Results  Component Value Date/Time   CHOL 165 12/29/2017 11:00 AM   TRIG 115 12/29/2017 11:00 AM   HDL 40 12/29/2017 11:00 AM   CHOLHDL 4.1 12/29/2017 11:00 AM   CHOLHDL 3.7 09/06/2014 12:59 PM   LDLCALC 102 (H) 12/29/2017 11:00 AM    Wt Readings from Last 3 Encounters:  03/01/19 180 lb (81.6 kg)  02/24/19 183 lb (83 kg)  02/06/19 180 lb (81.6 kg)     Objective:    Vital Signs:  BP (!) 141/79    Pulse 75    Ht 5\' 9"  (1.753 m)    Wt 180 lb (81.6 kg)    BMI 26.58 kg/m    Recent blood pressure and heart rate over the last few days: 116/68, 68 105/70, 81 151/111, 57 (?) 125/72, 73 133/52, 83  General: Elderly male, appears comfortable seated in his home. HEENT: Conjunctiva and lids normal. Skin: Color and turgor appear normal. Lungs: Nonlabored breathing while speaking in full sentences.  No audible wheezing. Neuropsychiatric: Affect is appropriate.  Gaze is conjugate.  Voice tone and speech pattern are normal.  He moves all extremities.  ASSESSMENT &  PLAN:    1.  Transient episode of atrial fibrillation in the setting of pneumonia as detailed above.  CHADSVASC score is 4.  He is hesitant to consider anticoagulation for stroke prophylaxis now in light of slowly healing skin wounds on his legs.  These are improving and he will reconsider the matter going forward.  It is not entirely clear whether this was an isolated episode of atrial fibrillation with his pneumonia or whether he has paroxysmal atrial fibrillation.  For now he will continue aspirin along with Coreg and we will reassess over the next 3 months.  2.  Cardiomyopathy with mildly reduced LVEF in the range of 45 to 50% by recent assessment.  Agree with initiation of Coreg for now.  Continue to track heart rate and blood pressure.  We will ultimately reevaluate LV function prior to making further adjustments.  3.  Essential hypertension.  Recent measurements noted.  He is now off HCTZ.  If additional control needed will consider initiation of ARB.  4.  History of hyperlipidemia, diet managed.  LDL 102 in 2019.  COVID-19 Education: The signs and symptoms of COVID-19 were discussed with the patient and how to seek care for testing (follow up with PCP or arrange E-visit).  The importance of social distancing was discussed today.  Time:   Today, I have spent 12 minutes with the patient with telehealth technology discussing the above problems.     Medication Adjustments/Labs and Tests Ordered: Current medicines are reviewed at length with the patient today.  Concerns regarding medicines are outlined above.   Tests Ordered: No orders of the defined types were placed in this encounter.   Medication Changes: No orders of the defined types were placed in this encounter.   Disposition:  Follow up 3 months in [place] office.  Signed, Rozann Lesches, MD  03/01/2019 2:23 PM    [place]

## 2019-02-28 NOTE — Telephone Encounter (Signed)
Virtual Visit Pre-Appointment Phone Call  "(Name), I am calling you today to discuss your upcoming appointment. We are currently trying to limit exposure to the virus that causes COVID-19 by seeing patients at home rather than in the office."  1. "What is the BEST phone number to call the day of the visit?" - include this in appointment notes  2. Do you have or have access to (through a family member/friend) a smartphone with video capability that we can use for your visit?" a. If yes - list this number in appt notes as cell (if different from BEST phone #) and list the appointment type as a VIDEO visit in appointment notes b. If no - list the appointment type as a PHONE visit in appointment notes  3. Confirm consent - "In the setting of the current Covid19 crisis, you are scheduled for a (phone or video) visit with your provider on (date) at (time).  Just as we do with many in-office visits, in order for you to participate in this visit, we must obtain consent.  If you'd like, I can send this to your mychart (if signed up) or email for you to review.  Otherwise, I can obtain your verbal consent now.  All virtual visits are billed to your insurance company just like a normal visit would be.  By agreeing to a virtual visit, we'd like you to understand that the technology does not allow for your provider to perform an examination, and thus may limit your provider's ability to fully assess your condition. If your provider identifies any concerns that need to be evaluated in person, we will make arrangements to do so.  Finally, though the technology is pretty good, we cannot assure that it will always work on either your or our end, and in the setting of a video visit, we may have to convert it to a phone-only visit.  In either situation, we cannot ensure that we have a secure connection.  Are you willing to proceed?" STAFF: Did the patient verbally acknowledge consent to telehealth visit? Document  YES/NO here: Yes  4. Advise patient to be prepared - "Two hours prior to your appointment, go ahead and check your blood pressure, pulse, oxygen saturation, and your weight (if you have the equipment to check those) and write them all down. When your visit starts, your provider will ask you for this information. If you have an Apple Watch or Kardia device, please plan to have heart rate information ready on the day of your appointment. Please have a pen and paper handy nearby the day of the visit as well."  5. Give patient instructions for MyChart download to smartphone OR [email or web address] as below if video visit (depending on what platform provider is using)  6. Inform patient they will receive a phone call 15 minutes prior to their appointment time (may be from unknown caller ID) so they should be prepared to answer    TELEPHONE CALL NOTE  Gerald Hall has been deemed a candidate for a follow-up tele-health visit to limit community exposure during the Covid-19 pandemic. I spoke with the patient via phone to ensure availability of phone/video source, confirm preferred email & phone number, and discuss instructions and expectations.  I reminded Gerald Hall to be prepared with any vital sign and/or heart rhythm information that could potentially be obtained via home monitoring, at the time of his visit. I reminded Gerald Hall to expect a phone call prior to  his visit.  [name] 02/28/2019 10:08 AM

## 2019-03-01 ENCOUNTER — Ambulatory Visit: Payer: Medicare Other | Admitting: Family Medicine

## 2019-03-01 ENCOUNTER — Encounter: Payer: Self-pay | Admitting: Cardiology

## 2019-03-01 ENCOUNTER — Telehealth (INDEPENDENT_AMBULATORY_CARE_PROVIDER_SITE_OTHER): Payer: Medicare Other | Admitting: Cardiology

## 2019-03-01 VITALS — BP 141/79 | HR 75 | Ht 69.0 in | Wt 180.0 lb

## 2019-03-01 DIAGNOSIS — E782 Mixed hyperlipidemia: Secondary | ICD-10-CM

## 2019-03-01 DIAGNOSIS — Z7189 Other specified counseling: Secondary | ICD-10-CM

## 2019-03-01 DIAGNOSIS — I10 Essential (primary) hypertension: Secondary | ICD-10-CM | POA: Diagnosis not present

## 2019-03-01 DIAGNOSIS — I429 Cardiomyopathy, unspecified: Secondary | ICD-10-CM

## 2019-03-01 DIAGNOSIS — I4891 Unspecified atrial fibrillation: Secondary | ICD-10-CM | POA: Diagnosis not present

## 2019-03-01 DIAGNOSIS — Z8701 Personal history of pneumonia (recurrent): Secondary | ICD-10-CM

## 2019-03-01 NOTE — Patient Instructions (Addendum)
Medication Instructions:   Your physician recommends that you continue on your current medications as directed. Please refer to the Current Medication list given to you today.  Labwork:  NONE  Testing/Procedures:  NONE  Follow-Up:  Your physician recommends that you schedule a follow-up appointment in: 3 months at the [place] office.  Any Other Special Instructions Will Be Listed Below (If Applicable).  If you need a refill on your cardiac medications before your next appointment, please call your pharmacy.

## 2019-03-12 ENCOUNTER — Telehealth: Payer: Self-pay | Admitting: Family Medicine

## 2019-03-12 ENCOUNTER — Other Ambulatory Visit: Payer: Self-pay | Admitting: Family Medicine

## 2019-03-12 MED ORDER — HYDROCHLOROTHIAZIDE 25 MG PO TABS: 25.0000 mg | ORAL_TABLET | Freq: Every day | ORAL | 1 refills | Status: DC

## 2019-03-12 MED ORDER — TRAMADOL HCL 50 MG PO TABS: 50.0000 mg | ORAL_TABLET | Freq: Two times a day (BID) | ORAL | 5 refills | Status: DC | PRN

## 2019-03-12 MED ORDER — POTASSIUM CHLORIDE ER 10 MEQ PO TBCR: 10.0000 meq | EXTENDED_RELEASE_TABLET | Freq: Every day | ORAL | 1 refills | Status: DC

## 2019-03-12 NOTE — Telephone Encounter (Signed)
I spoke with patient he was having some swelling in the feet after discussion it was decided to restart him on HCTZ and potassium scripts were sent and put on file he already has some He is also using tramadol at nighttime to help him with discomforts it helps him sleep as well 1 per night is fine additional prescription was sent in  Front-please call the patient to set up an office visit mid June here in the office

## 2019-03-12 NOTE — Telephone Encounter (Signed)
Patient said he was told to call us back with an update.  Patient said he is feeling better, just some slight swelling in his feet.

## 2019-03-20 NOTE — Telephone Encounter (Signed)
No answer on phone.  Recall set up for patient.

## 2019-03-26 DIAGNOSIS — R3915 Urgency of urination: Secondary | ICD-10-CM | POA: Diagnosis not present

## 2019-03-28 ENCOUNTER — Telehealth: Payer: Self-pay | Admitting: Family Medicine

## 2019-03-28 DIAGNOSIS — E785 Hyperlipidemia, unspecified: Secondary | ICD-10-CM

## 2019-03-28 DIAGNOSIS — D72829 Elevated white blood cell count, unspecified: Secondary | ICD-10-CM

## 2019-03-28 DIAGNOSIS — N289 Disorder of kidney and ureter, unspecified: Secondary | ICD-10-CM

## 2019-03-28 NOTE — Telephone Encounter (Signed)
Pt has follow up in office scheduled June 18th. He has labs ordered but would like to know if Dr. Nicki Reaper wants to add additional labs

## 2019-03-28 NOTE — Telephone Encounter (Signed)
Please read do his lab orders I recommend a lipid, metabolic 7, CBC Renal insufficiency, hyperlipidemia, leukocytosis

## 2019-03-28 NOTE — Telephone Encounter (Signed)
Patient has a bmet and lipid panel ordered by you from 02/06/2019. He had been in the hospital in May and had a lot of test done at that time. He wanted to know if you want anything else drawn. Please advise.

## 2019-03-29 NOTE — Telephone Encounter (Signed)
Blood work ordered in Epic. Patient notified. 

## 2019-04-03 DIAGNOSIS — D72829 Elevated white blood cell count, unspecified: Secondary | ICD-10-CM | POA: Diagnosis not present

## 2019-04-03 DIAGNOSIS — N289 Disorder of kidney and ureter, unspecified: Secondary | ICD-10-CM | POA: Diagnosis not present

## 2019-04-03 DIAGNOSIS — E785 Hyperlipidemia, unspecified: Secondary | ICD-10-CM | POA: Diagnosis not present

## 2019-04-04 LAB — BASIC METABOLIC PANEL
BUN/Creatinine Ratio: 21 (ref 10–24)
BUN: 24 mg/dL (ref 8–27)
CO2: 24 mmol/L (ref 20–29)
Calcium: 9.3 mg/dL (ref 8.6–10.2)
Chloride: 96 mmol/L (ref 96–106)
Creatinine, Ser: 1.12 mg/dL (ref 0.76–1.27)
GFR calc Af Amer: 71 mL/min/{1.73_m2} (ref >59)
GFR calc non Af Amer: 61 mL/min/{1.73_m2} (ref >59)
Glucose: 98 mg/dL (ref 65–99)
Potassium: 3.2 mmol/L — ABNORMAL LOW (ref 3.5–5.2)
Sodium: 137 mmol/L (ref 134–144)

## 2019-04-04 LAB — CBC WITH DIFFERENTIAL/PLATELET
Basophils Absolute: 0.1 10*3/uL (ref 0.0–0.2)
Basos: 1 %
EOS (ABSOLUTE): 0.4 10*3/uL (ref 0.0–0.4)
Eos: 4 %
Hematocrit: 48.4 % (ref 37.5–51.0)
Hemoglobin: 17 g/dL (ref 13.0–17.7)
Immature Grans (Abs): 0 10*3/uL (ref 0.0–0.1)
Immature Granulocytes: 0 %
Lymphocytes Absolute: 3 10*3/uL (ref 0.7–3.1)
Lymphs: 28 %
MCH: 32.3 pg (ref 26.6–33.0)
MCHC: 35.1 g/dL (ref 31.5–35.7)
MCV: 92 fL (ref 79–97)
Monocytes Absolute: 1.2 10*3/uL — ABNORMAL HIGH (ref 0.1–0.9)
Monocytes: 11 %
Neutrophils Absolute: 5.9 10*3/uL (ref 1.4–7.0)
Neutrophils: 56 %
Platelets: 190 10*3/uL (ref 150–450)
RBC: 5.27 x10E6/uL (ref 4.14–5.80)
RDW: 14.1 % (ref 11.6–15.4)
WBC: 10.6 10*3/uL (ref 3.4–10.8)

## 2019-04-04 LAB — LIPID PANEL
Chol/HDL Ratio: 4.2 ratio (ref 0.0–5.0)
Cholesterol, Total: 161 mg/dL (ref 100–199)
HDL: 38 mg/dL — ABNORMAL LOW (ref >39)
LDL Calculated: 94 mg/dL (ref 0–99)
Triglycerides: 146 mg/dL (ref 0–149)
VLDL Cholesterol Cal: 29 mg/dL (ref 5–40)

## 2019-04-05 MED ORDER — POTASSIUM CHLORIDE ER 10 MEQ PO TBCR: EXTENDED_RELEASE_TABLET | ORAL | 1 refills | Status: DC

## 2019-04-05 NOTE — Addendum Note (Signed)
Addended by: Vicente Males on: 04/05/2019 08:47 AM   Modules accepted: Orders

## 2019-04-12 ENCOUNTER — Other Ambulatory Visit: Payer: Self-pay

## 2019-04-12 ENCOUNTER — Encounter: Payer: Self-pay | Admitting: Family Medicine

## 2019-04-12 ENCOUNTER — Ambulatory Visit (INDEPENDENT_AMBULATORY_CARE_PROVIDER_SITE_OTHER): Payer: Medicare Other | Admitting: Family Medicine

## 2019-04-12 VITALS — BP 118/72 | Temp 97.8°F | Ht 69.0 in | Wt 180.0 lb

## 2019-04-12 DIAGNOSIS — D692 Other nonthrombocytopenic purpura: Secondary | ICD-10-CM

## 2019-04-12 DIAGNOSIS — R9389 Abnormal findings on diagnostic imaging of other specified body structures: Secondary | ICD-10-CM | POA: Diagnosis not present

## 2019-04-12 DIAGNOSIS — I480 Paroxysmal atrial fibrillation: Secondary | ICD-10-CM | POA: Diagnosis not present

## 2019-04-12 NOTE — Progress Notes (Signed)
Subjective:    Patient ID: Gerald Hall, male    DOB: 10/05/1935, 84 y.o.   MRN: [id number]   HPI  Patient arrives for a follow up on pneumonia.  Patient was seen for pneumonia in the ER treated and released it was recommended for the patient to have a follow-up chest x-ray we had discussed this before but he is now ready to get it done patient states he is feeling much better and thinks it is time for repeat chest xray.   Patient also had intermittent atrial fibrillation with rapid ventricular response while in the ER it was recommended by cardiology for him to be on the blood thinner he does not want to be on a blood thinner because of risk of bleeding he is on aspirin currently.  He denies any spells of rapid ventricular response since being in the hospital and states he feels his heart is doing well he would like to avoid being on a blood thinner if possible Pulmonary status doing well not having any problems currently Does have history of senile purpura Review of Systems  Constitutional: Negative for activity change, fatigue and fever.  HENT: Negative for congestion and rhinorrhea.   Respiratory: Negative for cough and shortness of breath.   Cardiovascular: Negative for chest pain and leg swelling.  Gastrointestinal: Negative for abdominal pain, diarrhea and nausea.  Genitourinary: Negative for dysuria and hematuria.  Neurological: Negative for weakness and headaches.  Psychiatric/Behavioral: Negative for agitation and behavioral problems.       Objective:   Physical Exam Vitals signs reviewed.  Constitutional:      General: He is not in acute distress. HENT:     Head: Normocephalic and atraumatic.  Eyes:     General:        Right eye: No discharge.        Left eye: No discharge.  Neck:     Trachea: No tracheal deviation.  Cardiovascular:     Rate and Rhythm: Normal rate and regular rhythm.     Heart sounds: Normal heart sounds. No murmur.  Pulmonary:     Effort:  Pulmonary effort is normal. No respiratory distress.     Breath sounds: Normal breath sounds. No wheezing.  Lymphadenopathy:     Cervical: No cervical adenopathy.  Skin:    General: Skin is warm.     Findings: No rash.  Neurological:     Mental Status: He is alert.  Psychiatric:        Behavior: Behavior normal.   I did discuss with him how aspirin does not significantly lower his risk for stroke if he is having atrial fibrillation.  Also discussed how a blood thinner such as Eliquis would reduce the risk of stroke significantly.  But does carry with it some risk of bleeding should he develop a GI bleed or fall and strike his head.  I also discussed with the patient that if he does not fact have ongoing atrial fibrillation it would be within his best interest to be on a anticoagulant  EKG has a lot of artifact.  Difficult to tell for certain but appears to be first-degree AV block.  Rhythm is regular.  25 minutes was spent with the patient.  This statement verifies that 25 minutes was indeed spent with the patient.  More than 50% of this visit-total duration of the visit-was spent in counseling and coordination of care. The issues that the patient came in for today as reflected in the  diagnosis (s) please refer to documentation for further details. Senile purpura stable    Assessment & Plan:  History of recent pneumonia follow-up chest x-ray as ordered  Possible intermittent atrial fibrillation currently on aspirin does not want to be on Eliquis but he states he is open to it should he have intermittent atrial fibrillation.  EKG does not point to atrial fibrillation currently but I believe the patient would benefit from seeing cardiology in order to have telemetry.  Certainly if his telemetry shows atrial fibrillation then he certainly should be on a blood thinner because of his increased risk for stroke.  Patient is a fall risk because he uses a walker but if he needed to be on a blood  thinner I believe he can hopefully avoid any falls

## 2019-04-13 ENCOUNTER — Telehealth: Payer: Self-pay

## 2019-04-13 NOTE — Telephone Encounter (Signed)
-----   Message from Satira Sark, MD sent at 04/13/2019  8:32 AM EDT ----- I saw this patient recently in consultation.  Please go ahead and schedule him for a 14-day telemetry ZIO patch to assess for any potential paroxysmal atrial fibrillation.  Ideally this should occur prior to his next scheduled follow-up visit.  Thank you. ----- Message ----- From: Kathyrn Drown, MD Sent: 04/12/2019   5:10 PM EDT To: Satira Sark, MD  Mutual patient-please see assessment-patient is open to telemetry to seeing if he is still at risk regarding atrial fibrillation-thank you for your input-Scott-he is willing to do a follow-up tele-visit if need be to do this

## 2019-04-17 ENCOUNTER — Ambulatory Visit (INDEPENDENT_AMBULATORY_CARE_PROVIDER_SITE_OTHER): Payer: Medicare Other

## 2019-04-17 ENCOUNTER — Ambulatory Visit (HOSPITAL_COMMUNITY)
Admission: RE | Admit: 2019-04-17 | Discharge: 2019-04-17 | Disposition: A | Discharge status: Home / Self Care | Payer: Medicare Other | Source: Ambulatory Visit | Attending: Family Medicine | Admitting: Family Medicine

## 2019-04-17 ENCOUNTER — Other Ambulatory Visit: Payer: Self-pay

## 2019-04-17 DIAGNOSIS — R0602 Shortness of breath: Secondary | ICD-10-CM | POA: Diagnosis not present

## 2019-04-17 DIAGNOSIS — I480 Paroxysmal atrial fibrillation: Secondary | ICD-10-CM

## 2019-04-17 DIAGNOSIS — I484 Atypical atrial flutter: Secondary | ICD-10-CM | POA: Diagnosis not present

## 2019-04-17 DIAGNOSIS — R9389 Abnormal findings on diagnostic imaging of other specified body structures: Secondary | ICD-10-CM | POA: Diagnosis not present

## 2019-04-19 ENCOUNTER — Encounter: Payer: Self-pay | Admitting: Family Medicine

## 2019-05-04 DIAGNOSIS — H02403 Unspecified ptosis of bilateral eyelids: Secondary | ICD-10-CM | POA: Diagnosis not present

## 2019-05-04 DIAGNOSIS — H04123 Dry eye syndrome of bilateral lacrimal glands: Secondary | ICD-10-CM | POA: Diagnosis not present

## 2019-05-04 DIAGNOSIS — Z961 Presence of intraocular lens: Secondary | ICD-10-CM | POA: Diagnosis not present

## 2019-05-04 DIAGNOSIS — H3552 Pigmentary retinal dystrophy: Secondary | ICD-10-CM | POA: Diagnosis not present

## 2019-05-07 ENCOUNTER — Telehealth: Payer: Self-pay | Admitting: Family Medicine

## 2019-05-07 NOTE — Telephone Encounter (Signed)
Robin with [employer] went and done a visit with pt. She is calling to report on the PAD screening for circulation pt's left leg was 1.04 (normal) right leg is 0.59 (below normal)  Normal Range 1.00-1.40.   He is not having any symptoms.   The company will be sending a hard copy of the assessment over for Dr. Nicki Reaper to review.   CB# [phone]

## 2019-05-08 NOTE — Telephone Encounter (Signed)
So noted Given that the patient is not having any symptoms we will wait for the hardcopy

## 2019-05-17 ENCOUNTER — Other Ambulatory Visit: Payer: Self-pay

## 2019-05-17 DIAGNOSIS — I480 Paroxysmal atrial fibrillation: Secondary | ICD-10-CM

## 2019-05-22 ENCOUNTER — Other Ambulatory Visit: Payer: Self-pay | Admitting: Family Medicine

## 2019-05-24 DIAGNOSIS — D485 Neoplasm of uncertain behavior of skin: Secondary | ICD-10-CM | POA: Diagnosis not present

## 2019-05-24 DIAGNOSIS — C44619 Basal cell carcinoma of skin of left upper limb, including shoulder: Secondary | ICD-10-CM | POA: Diagnosis not present

## 2019-05-24 DIAGNOSIS — Z859 Personal history of malignant neoplasm, unspecified: Secondary | ICD-10-CM | POA: Diagnosis not present

## 2019-05-24 DIAGNOSIS — L578 Other skin changes due to chronic exposure to nonionizing radiation: Secondary | ICD-10-CM | POA: Diagnosis not present

## 2019-05-24 DIAGNOSIS — L570 Actinic keratosis: Secondary | ICD-10-CM | POA: Diagnosis not present

## 2019-05-24 DIAGNOSIS — Z872 Personal history of diseases of the skin and subcutaneous tissue: Secondary | ICD-10-CM | POA: Diagnosis not present

## 2019-06-10 NOTE — Progress Notes (Signed)
Cardiology Office Note  Date: 06/11/2019   ID: Gerald Hall, Gerald Hall 11-Dec-1934, MRN [id number]  PCP:  Kathyrn Drown, MD  Cardiologist:  Rozann Lesches, MD Electrophysiologist:  None   Chief Complaint  Patient presents with  . Cardiac follow-up    History of Present Illness: Gerald Hall is an 84 y.o. male that I met in consultation via telehealth encounter in May.  He presents for a follow-up visit.  He does not necessarily endorse a sense of palpitations, but has noticed that his heart rate fluctuates when he has a blood pressure check or use his pulse oximeter at home.  I reviewed home recordings.  Interval cardiac monitor demonstrated several breakthrough episodes of SVT, difficult to exclude atrial flutter although potentially a reentrant SVT or atrial tachycardia.  No definite atrial fibrillation however.  He is using a walker, reports having a fall a few weeks ago after standing.  States that he hit his head and tailbone.  I reviewed his medications which are outlined below.  We discussed addition of low-dose amiodarone for rhythm suppression, but would not necessarily pursue anticoagulation at this time.  He had normal TSH in May, no recent LFTs.  Past Medical History:  Diagnosis Date  . Arthritis   . Balance problem    Uses a walker  . Blood in urine    Sees urology yearly  . Deafness in right ear age 88  . Hyperlipidemia   . Hypertension   . Temporal arteritis ([place]) 2016  . Urinary incontinence     Past Surgical History:  Procedure Laterality Date  . BACK SURGERY  2010  . CATARACT EXTRACTION Bilateral 07/30/14, 08/13/2014  . CERVICAL SPINE SURGERY  09/01/15   C 4-5 , [place]  . COLONOSCOPY  12/19/2007   RMR: 1. External hemorrohids, otherwise normal rectum.2. Normal colon. 3. Normal terminal ileum.  . COLONOSCOPY N/A 12/13/2014   Procedure: COLONOSCOPY;  Surgeon: Daneil Dolin, MD;  [facility];  Service: Endoscopy;  Laterality:  N/A;  11:15 Pt Request Time  . KNEE SURGERY Left    around 2000, arthroscopic  . TOTAL HIP ARTHROPLASTY Right 02/23/2017   Procedure: TOTAL HIP ARTHROPLASTY ANTERIOR APPROACH;  Surgeon: Hessie Knows, MD;  Location: [facility] ORS;  Service: Orthopedics;  Laterality: Right;    Current Outpatient Medications  Medication Sig Dispense Refill  . albuterol (PROVENTIL HFA;VENTOLIN HFA) 108 (90 Base) MCG/ACT inhaler Inhale 2 puffs into the lungs every 4 (four) hours as needed for wheezing. 1 Inhaler 2  . aspirin EC 325 MG EC tablet Take 1 tablet (325 mg total) by mouth daily. 30 tablet 0  . carvedilol (COREG) 3.125 MG tablet Take 1 tablet (3.125 mg total) by mouth 2 (two) times daily with a meal for 30 days. 60 tablet 3  . cholecalciferol (VITAMIN D) 400 units TABS tablet Take 400 Units by mouth daily.     Marland Kitchen Dextromethorphan-guaiFENesin (MUCINEX DM) 30-600 MG TB12 Take 1 tablet by mouth 2 (two) times a day.    . finasteride (PROSCAR) 5 MG tablet Take 5 mg by mouth daily. One tab daily    . fluticasone (FLONASE) 50 MCG/ACT nasal spray SPRAY 2 SPRAYS INTO EACH NOSTRIL ONCE DAILY 16 g 0  . hydrochlorothiazide (HYDRODIURIL) 25 MG tablet Take 1 tablet (25 mg total) by mouth daily. 90 tablet 1  . potassium chloride (K-DUR) 10 MEQ tablet Take one tablet po twice daily 180 tablet 1  . rOPINIRole (REQUIP)  2 MG tablet TAKE 1 TABLET BY MOUTH AT LATE AFTERNOON AND TAKE ONE TABLET AT BEDTIME (Patient taking differently: Take 4 mg by mouth at bedtime. TAKE 1 TABLET BY MOUTH AT LATE AFTERNOON AND TAKE ONE TABLET AT BEDTIME) 60 tablet 5  . SYMBICORT 160-4.5 MCG/ACT inhaler INHALE 2 PUFFS BY MOUTH TWICE DAILY 10.2 g 10  . tamsulosin (FLOMAX) 0.4 MG CAPS capsule Take 1 capsule (0.4 mg total) by mouth at bedtime. (Patient taking differently: 2 tabs daily at bedtime) 90 capsule 1  . traMADol (ULTRAM) 50 MG tablet Take 1 tablet (50 mg total) by mouth every 12 (twelve) hours as needed. 60 tablet 5  . vitamin C (ASCORBIC ACID) 500  MG tablet Take 500 mg by mouth daily.    . Zinc Acetate, Oral, (ZINC ACETATE PO) Take 1 tablet by mouth daily.    Marland Kitchen amiodarone (PACERONE) 200 MG tablet Take 1 tablet (200 mg total) by mouth daily. 90 tablet 3   No current facility-administered medications for this visit.    Allergies:  Patient has no known allergies.   Social History: The patient  reports that he has been smoking cigarettes. He has been smoking about 0.50 packs per day. He has never used smokeless tobacco. He reports current alcohol use. He reports that he does not use drugs.   ROS:  Please see the history of present illness. Otherwise, complete review of systems is positive for hearing loss.  All other systems are reviewed and negative.   Physical Exam: VS:  BP 123/71   Pulse 60   Temp (!) 97.1 F (36.2 C)   Ht 5' 9"  (1.753 m)   Wt 181 lb (82.1 kg)   BMI 26.73 kg/m , BMI Body mass index is 26.73 kg/m.  Wt Readings from Last 3 Encounters:  06/11/19 181 lb (82.1 kg)  04/12/19 180 lb (81.6 kg)  03/01/19 180 lb (81.6 kg)    General: Elderly male, appears comfortable at rest.  Using a walker. HEENT: Conjunctiva and lids normal, wearing a mask. Neck: Supple, no elevated JVP or carotid bruits, no thyromegaly. Lungs: Clear to auscultation, nonlabored breathing at rest. Cardiac: Regular rate and rhythm, no S3 or significant systolic murmur. Abdomen: Soft, nontender, bowel sounds present. Extremities: Mild ankle edema, distal pulses 2+. Skin: Warm and dry. Musculoskeletal: No kyphosis. Neuropsychiatric: Alert and oriented x3, affect grossly appropriate.  ECG:  An ECG dated 04/12/2019 was personally reviewed today and demonstrated:  Poor quality tracing showing possible sinus rhythm with prolonged PR interval.  Recent Labwork: 02/23/2019: Magnesium 2.1; TSH 1.047 04/03/2019: BUN 24; Creatinine, Ser 1.12; Hemoglobin 17.0; Platelets 190; Potassium 3.2; Sodium 137     Component Value Date/Time   CHOL 161 04/03/2019 1423    TRIG 146 04/03/2019 1423   HDL 38 (L) 04/03/2019 1423   CHOLHDL 4.2 04/03/2019 1423   CHOLHDL 3.7 09/06/2014 1259   VLDL 29 09/06/2014 1259   LDLCALC 94 04/03/2019 1423    Other Studies Reviewed Today:  Echocardiogram 02/24/2019: 1. The left ventricle has mildly reduced systolic function, with an ejection fraction of 45-50%. The cavity size was normal. 2. The right ventricle has normal systolc function. The cavity was mildly enlarged. There is no increase in right ventricular wall thickness. 3. The mitral valve is abnormal. Mild thickening of the mitral valve leaflet. 4. The tricuspid valve was grossly normal. 5. The aortic valve is tricuspid Mild thickening of the aortic valve Mild calcification of the aortic valve. 6. The aortic root is  normal in size and structure. 7. The interatrial septum was not assessed.  14-day event recorder 05/17/2019: 14-day event recorder reviewed.  Predominant rhythm is sinus with heart rate ranging from 50 bpm up to 107 bpm, average heart rate 68 bpm.  There were 7 episodes of NSVT noted, 4-5 beats at most.  Brief episode of ventricular bigeminy noted.  No sustained ventricular arrhythmias.  Several episodes of SVT were also noted, largely regular narrow complex tachycardia which could represent either atrial flutter or possibly a reentrant mechanism or atrial tachycardia.  There were no pauses.  Assessment and Plan:  1.  PSVT as outlined above.  Could be a reentrant mechanism or atrial tachycardia, however atrial flutter not excluded.  There were no definitive episodes of atrial fibrillation noted with cardiac monitor however.  At this time plan to initiate amiodarone 200 mg daily, otherwise continue Coreg with mildly reduced LVEF.  He will stay on aspirin, we do not plan to pursue anticoagulation at this time, particular in light of his falls.  Check baseline LFTs, recent TSH normal.  2.  Secondary cardiomyopathy, LVEF 45 to 50%.  Continue Coreg.   Question whether this could be related to frequent intermittent PSVT.  We will focus on rhythm suppression if possible.  3.  Essential hypertension, home blood pressure checks reviewed.  Today's blood pressure is well controlled.  4.  Diet managed hyperlipidemia.  Medication Adjustments/Labs and Tests Ordered: Current medicines are reviewed at length with the patient today.  Concerns regarding medicines are outlined above.   Tests Ordered: Orders Placed This Encounter  Procedures  . Hepatic function panel    Medication Changes: Meds ordered this encounter  Medications  . amiodarone (PACERONE) 200 MG tablet    Sig: Take 1 tablet (200 mg total) by mouth daily.    Dispense:  90 tablet    Refill:  3    Disposition:  Follow up 2 to 3 months in the [place] office.  Signed, Satira Sark, MD, [facility] 06/11/2019 11:17 AM    [place] at [facility] [address], [place], [place] [address] Phone: [phone]; Fax: [phone]

## 2019-06-11 ENCOUNTER — Ambulatory Visit (INDEPENDENT_AMBULATORY_CARE_PROVIDER_SITE_OTHER): Payer: Medicare Other | Admitting: Cardiology

## 2019-06-11 ENCOUNTER — Other Ambulatory Visit: Payer: Self-pay

## 2019-06-11 ENCOUNTER — Encounter: Payer: Self-pay | Admitting: Cardiology

## 2019-06-11 ENCOUNTER — Other Ambulatory Visit: Payer: Self-pay | Admitting: Cardiology

## 2019-06-11 VITALS — BP 123/71 | HR 60 | Temp 97.1°F | Ht 69.0 in | Wt 181.0 lb

## 2019-06-11 DIAGNOSIS — I429 Cardiomyopathy, unspecified: Secondary | ICD-10-CM | POA: Diagnosis not present

## 2019-06-11 DIAGNOSIS — I10 Essential (primary) hypertension: Secondary | ICD-10-CM

## 2019-06-11 DIAGNOSIS — E782 Mixed hyperlipidemia: Secondary | ICD-10-CM

## 2019-06-11 DIAGNOSIS — I471 Supraventricular tachycardia: Secondary | ICD-10-CM

## 2019-06-11 MED ORDER — AMIODARONE HCL 200 MG PO TABS: 200.0000 mg | ORAL_TABLET | Freq: Every day | ORAL | 3 refills | Status: DC

## 2019-06-11 NOTE — Patient Instructions (Signed)
Medication Instructions:  START Amiodarone 200 mg daily  Labwork: LFT's  Procedures/Testing: None today  Follow-Up: 2-3 months with Dr.McDowell  Any Additional Special Instructions Will Be Listed Below (If Applicable).     If you need a refill on your cardiac medications before your next appointment, please call your pharmacy.     Thank you for choosing [facility] !

## 2019-06-12 LAB — HEPATIC FUNCTION PANEL
ALT: 10 IU/L (ref 0–44)
AST: 12 IU/L (ref 0–40)
Albumin: 4 g/dL (ref 3.6–4.6)
Alkaline Phosphatase: 90 IU/L (ref 39–117)
Bilirubin Total: 0.5 mg/dL (ref 0.0–1.2)
Bilirubin, Direct: 0.18 mg/dL (ref 0.00–0.40)
Total Protein: 6.5 g/dL (ref 6.0–8.5)

## 2019-06-18 DIAGNOSIS — C44619 Basal cell carcinoma of skin of left upper limb, including shoulder: Secondary | ICD-10-CM | POA: Diagnosis not present

## 2019-06-20 ENCOUNTER — Other Ambulatory Visit: Payer: Self-pay | Admitting: Family Medicine

## 2019-06-28 DIAGNOSIS — R3915 Urgency of urination: Secondary | ICD-10-CM | POA: Diagnosis not present

## 2019-07-10 DIAGNOSIS — C44619 Basal cell carcinoma of skin of left upper limb, including shoulder: Secondary | ICD-10-CM | POA: Diagnosis not present

## 2019-07-13 ENCOUNTER — Ambulatory Visit: Payer: Medicare Other | Admitting: Family Medicine

## 2019-07-19 DIAGNOSIS — R3915 Urgency of urination: Secondary | ICD-10-CM | POA: Diagnosis not present

## 2019-07-27 ENCOUNTER — Other Ambulatory Visit: Payer: Self-pay | Admitting: Family Medicine

## 2019-07-27 NOTE — Telephone Encounter (Signed)
6 refills of each

## 2019-07-30 DIAGNOSIS — R3915 Urgency of urination: Secondary | ICD-10-CM | POA: Diagnosis not present

## 2019-08-06 DIAGNOSIS — C44619 Basal cell carcinoma of skin of left upper limb, including shoulder: Secondary | ICD-10-CM | POA: Diagnosis not present

## 2019-08-13 ENCOUNTER — Ambulatory Visit: Payer: Medicare Other | Admitting: Cardiology

## 2019-08-13 ENCOUNTER — Encounter: Payer: Self-pay | Admitting: Cardiology

## 2019-08-13 ENCOUNTER — Other Ambulatory Visit: Payer: Self-pay

## 2019-08-13 VITALS — BP 148/76 | HR 69 | Temp 97.0°F | Ht 69.0 in | Wt 185.0 lb

## 2019-08-13 DIAGNOSIS — I10 Essential (primary) hypertension: Secondary | ICD-10-CM

## 2019-08-13 DIAGNOSIS — I429 Cardiomyopathy, unspecified: Secondary | ICD-10-CM | POA: Diagnosis not present

## 2019-08-13 DIAGNOSIS — I471 Supraventricular tachycardia: Secondary | ICD-10-CM

## 2019-08-13 DIAGNOSIS — Z79899 Other long term (current) drug therapy: Secondary | ICD-10-CM | POA: Diagnosis not present

## 2019-08-13 NOTE — Patient Instructions (Signed)
Medication Instructions:  Your physician recommends that you continue on your current medications as directed. Please refer to the Current Medication list given to you today.  *If you need a refill on your cardiac medications before your next appointment, please call your pharmacy*  Lab Work: TSH and LFT's JUST BEFORE visit in 6 months If you have labs (blood work) drawn today and your tests are completely normal, you will receive your results only by: Marland Kitchen MyChart Message (if you have MyChart) OR . A paper copy in the mail If you have any lab test that is abnormal or we need to change your treatment, we will call you to review the results.  Testing/Procedures: None  Follow-Up: At [facility], you and your health needs are our priority.  As part of our continuing mission to provide you with exceptional heart care, we have created designated Provider Care Teams.  These Care Teams include your primary Cardiologist (physician) and Advanced Practice Providers (APPs -  Physician Assistants and Nurse Practitioners) who all work together to provide you with the care you need, when you need it.  Your next appointment:   6 months  The format for your next appointment:   In Person  Provider:   Rozann Lesches, MD  Other Instructions None

## 2019-08-13 NOTE — Progress Notes (Signed)
Cardiology Office Note  Date: 08/13/2019   ID: Andwele, Siddons Sep 06, 1935, MRN [id number]  PCP:  Kathyrn Drown, MD  Cardiologist:  Rozann Lesches, MD Electrophysiologist:  None   Chief Complaint  Patient presents with  . Cardiac follow-up    History of Present Illness: Gerald Hall is an 84 y.o. male last seen in August.  He presents for a follow-up visit.  States that he has been doing well overall, no palpitations or chest pain.  At the last visit we initiated amiodarone for rhythm suppression and otherwise continued Coreg and aspirin.  We went over his home blood pressure and heart rate checks, he has had no tachycardia documented.  Baseline lab work from earlier this year showed normal TSH and LFTs.  He is contemplating having prostate surgery with Dr. Gilford Rile in [place], has not yet made a decision.  He has been having trouble with incontinence.  Past Medical History:  Diagnosis Date  . Arthritis   . Balance problem    Uses a walker  . Blood in urine    Sees urology yearly  . Deafness in right ear age 24  . Hyperlipidemia   . Hypertension   . Temporal arteritis ([place]) 2016  . Urinary incontinence     Past Surgical History:  Procedure Laterality Date  . BACK SURGERY  2010  . CATARACT EXTRACTION Bilateral 07/30/14, 08/13/2014  . CERVICAL SPINE SURGERY  09/01/15   C 4-5 , [place]  . COLONOSCOPY  12/19/2007   RMR: 1. External hemorrohids, otherwise normal rectum.2. Normal colon. 3. Normal terminal ileum.  . COLONOSCOPY N/A 12/13/2014   Procedure: COLONOSCOPY;  Surgeon: Daneil Dolin, MD;  [facility];  Service: Endoscopy;  Laterality: N/A;  11:15 Pt Request Time  . KNEE SURGERY Left    around 2000, arthroscopic  . TOTAL HIP ARTHROPLASTY Right 02/23/2017   Procedure: TOTAL HIP ARTHROPLASTY ANTERIOR APPROACH;  Surgeon: Hessie Knows, MD;  Location: [facility] ORS;  Service: Orthopedics;  Laterality: Right;    Current Outpatient  Medications  Medication Sig Dispense Refill  . albuterol (PROVENTIL HFA;VENTOLIN HFA) 108 (90 Base) MCG/ACT inhaler Inhale 2 puffs into the lungs every 4 (four) hours as needed for wheezing. 1 Inhaler 2  . amiodarone (PACERONE) 200 MG tablet Take 1 tablet (200 mg total) by mouth daily. 90 tablet 3  . aspirin EC 325 MG EC tablet Take 1 tablet (325 mg total) by mouth daily. 30 tablet 0  . carvedilol (COREG) 3.125 MG tablet Take 1 tablet (3.125 mg total) by mouth 2 (two) times daily with a meal 60 tablet 0  . cholecalciferol (VITAMIN D) 400 units TABS tablet Take 400 Units by mouth daily.     Marland Kitchen Dextromethorphan-guaiFENesin (MUCINEX DM) 30-600 MG TB12 Take 1 tablet by mouth 2 (two) times a day.    . finasteride (PROSCAR) 5 MG tablet Take 5 mg by mouth daily. One tab daily    . fluticasone (FLONASE) 50 MCG/ACT nasal spray SPRAY 2 SPRAYS INTO EACH NOSTRIL ONCE DAILY 16 g 5  . hydrochlorothiazide (HYDRODIURIL) 25 MG tablet TAKE 1 TABLET BY MOUTH ONCE DAILY. 90 tablet 1  . potassium chloride (K-DUR) 10 MEQ tablet Take one tablet po twice daily 180 tablet 1  . rOPINIRole (REQUIP) 2 MG tablet Take 2 tablets (4 mg total) by mouth at bedtime. TAKE 1 TABLET BY MOUTH AT LATE AFTERNOON AND TAKE ONE TABLET AT BEDTIME 60 tablet 5  . SYMBICORT  160-4.5 MCG/ACT inhaler INHALE 2 PUFFS BY MOUTH TWICE DAILY 10.2 g 10  . tamsulosin (FLOMAX) 0.4 MG CAPS capsule Take 1 capsule (0.4 mg total) by mouth at bedtime. (Patient taking differently: 2 tabs daily at bedtime) 90 capsule 1  . traMADol (ULTRAM) 50 MG tablet Take 1 tablet (50 mg total) by mouth every 12 (twelve) hours as needed. 60 tablet 5  . vitamin C (ASCORBIC ACID) 500 MG tablet Take 500 mg by mouth daily.    . Zinc Acetate, Oral, (ZINC ACETATE PO) Take 1 tablet by mouth daily.     No current facility-administered medications for this visit.    Allergies:  Patient has no known allergies.   Social History: The patient  reports that he has been smoking cigarettes.  He has been smoking about 0.50 packs per day. He has never used smokeless tobacco. He reports current alcohol use. He reports that he does not use drugs.   ROS:  Please see the history of present illness. Otherwise, complete review of systems is positive for none.  All other systems are reviewed and negative.   Physical Exam: VS:  BP (!) 148/76   Pulse 69   Temp (!) 97 F (36.1 C)   Ht 5\' 9"  (1.753 m)   Wt 185 lb (83.9 kg)   SpO2 97%   BMI 27.32 kg/m , BMI Body mass index is 27.32 kg/m.  Wt Readings from Last 3 Encounters:  08/13/19 185 lb (83.9 kg)  06/11/19 181 lb (82.1 kg)  04/12/19 180 lb (81.6 kg)    General: Elderly male, appears comfortable at rest. HEENT: Conjunctiva and lids normal, wearing a mask. Neck: Supple, no elevated JVP or carotid bruits, no thyromegaly. Lungs: Clear to auscultation, nonlabored breathing at rest. Cardiac: Regular rate and rhythm, no S3 or significant systolic murmur, no pericardial rub. Abdomen: Soft, nontender, bowel sounds present. Extremities: Mild ankle edema, distal pulses 2+. Skin: Warm and dry. Musculoskeletal: No kyphosis. Neuropsychiatric: Alert and oriented x3, affect grossly appropriate.  ECG:  An ECG dated 04/12/2019 was personally reviewed today and demonstrated:  Poor quality tracing showing possible sinus rhythm with prolonged PR interval.  Recent Labwork: 02/23/2019: Magnesium 2.1; TSH 1.047 04/03/2019: BUN 24; Creatinine, Ser 1.12; Hemoglobin 17.0; Platelets 190; Potassium 3.2; Sodium 137 06/11/2019: ALT 10; AST 12     Component Value Date/Time   CHOL 161 04/03/2019 1423   TRIG 146 04/03/2019 1423   HDL 38 (L) 04/03/2019 1423   CHOLHDL 4.2 04/03/2019 1423   CHOLHDL 3.7 09/06/2014 1259   VLDL 29 09/06/2014 1259   LDLCALC 94 04/03/2019 1423    Other Studies Reviewed Today:  Echocardiogram 02/24/2019: 1. The left ventricle has mildly reduced systolic function, with an ejection fraction of 45-50%. The cavity size was normal.  2. The right ventricle has normal systolc function. The cavity was mildly enlarged. There is no increase in right ventricular wall thickness. 3. The mitral valve is abnormal. Mild thickening of the mitral valve leaflet. 4. The tricuspid valve was grossly normal. 5. The aortic valve is tricuspid Mild thickening of the aortic valve Mild calcification of the aortic valve. 6. The aortic root is normal in size and structure. 7. The interatrial septum was not assessed.  14-day event recorder 05/17/2019: 14-day event recorder reviewed. Predominant rhythm is sinus with heart rate ranging from 50 bpm up to 107 bpm, average heart rate 68 bpm. There were 7 episodes of NSVT noted, 4-5 beats at most. Brief episode of ventricular bigeminy noted.  No sustained ventricular arrhythmias. Several episodes of SVT were also noted, largely regular narrow complex tachycardia which could represent either atrial flutter or possibly a reentrant mechanism or atrial tachycardia. There were no pauses.  Assessment and Plan:  1.  PSVT, possibly reentrant mechanism or atrial tachycardia, atypical atrial flutter not excluded.  Plan is to continue with amiodarone and Coreg, remain on aspirin without plan to pursue anticoagulation at this point.  Recheck TSH and LFTs for next visit.  2.  Secondary cardiomyopathy with mildly reduced LVEF of 45 to 50%.  Continue Coreg and observation for now.  3.  Essential hypertension, keep follow-up with Dr. Wolfgang Phoenix.  4.  Contemplating prostate surgery, presumably under general anesthesia with Dr. Gilford Rile.  I would not anticipate further cardiac testing prior to this, overall perioperative cardiac risk would be low to intermediate range.  Medication Adjustments/Labs and Tests Ordered: Current medicines are reviewed at length with the patient today.  Concerns regarding medicines are outlined above.   Tests Ordered: Orders Placed This Encounter  Procedures  . TSH  . Hepatic  function panel    Medication Changes: No orders of the defined types were placed in this encounter.   Disposition:  Follow up 6 months in the [place] office.  Signed, Satira Sark, MD, [facility] 08/13/2019 2:20 PM    [facility] at [facility] [address] [place], [place] [address] Phone: [phone]; Fax: [phone]

## 2019-08-18 ENCOUNTER — Other Ambulatory Visit: Payer: Self-pay | Admitting: Family Medicine

## 2019-08-22 ENCOUNTER — Ambulatory Visit (INDEPENDENT_AMBULATORY_CARE_PROVIDER_SITE_OTHER): Payer: Medicare Other | Admitting: Family Medicine

## 2019-08-22 ENCOUNTER — Encounter: Payer: Self-pay | Admitting: Family Medicine

## 2019-08-22 ENCOUNTER — Other Ambulatory Visit: Payer: Self-pay | Admitting: Family Medicine

## 2019-08-22 ENCOUNTER — Other Ambulatory Visit: Payer: Self-pay

## 2019-08-22 VITALS — BP 128/70 | Temp 97.7°F | Ht 69.0 in | Wt 184.0 lb

## 2019-08-22 DIAGNOSIS — Z23 Encounter for immunization: Secondary | ICD-10-CM

## 2019-08-22 DIAGNOSIS — I10 Essential (primary) hypertension: Secondary | ICD-10-CM | POA: Diagnosis not present

## 2019-08-22 DIAGNOSIS — G2581 Restless legs syndrome: Secondary | ICD-10-CM | POA: Diagnosis not present

## 2019-08-22 NOTE — Progress Notes (Signed)
   Subjective:    Patient ID: Gerald Hall, male    DOB: September 09, 1935, 84 y.o.   MRN: [id number]  HPI    Review of Systems     Objective:   Physical Exam        Assessment & Plan:

## 2019-08-22 NOTE — Progress Notes (Signed)
Subjective:    Patient ID: Gerald Hall, male    DOB: June 05, 1935, 84 y.o.   MRN: [id number]  Hypertension This is a chronic problem. Pertinent negatives include no chest pain, headaches or shortness of breath. Compliance problems: take meds every day, does stretching in the bed in the mornings, eats healthy.   Patient taking his blood pressure medicine regular basis states blood pressure has been under good control denies any major setbacks has seen cardiology recently who gave him approval for having coming up for possible surgery  Patient having problems with prostate looking at the possibility of a TURP we have notes from the urologist but not the most recent notes we will try to get those patient denies hematuria Still having trouble with not being able to control urination. He is seeing urology for this issues. They want to do procedure but pt wanted to discuss first.   Patient does have COPD does smoke recreationally denies any major setbacks recently uses his medicine regular basis states breathing seems to be doing okay  Patient walks around with a cane no falls or injuries states his moods been doing good denies being depressed  Flu vaccine today.   Patient gets a fair amount of musculoskeletal pains he uses tramadol twice a day denies abusing it does help him with his pain does not cause drowsiness  Patient with restless leg states medicine helps not quite as good as he like it do it is helping fairly well  Review of Systems  Constitutional: Negative for diaphoresis and fatigue.  HENT: Negative for congestion and rhinorrhea.   Respiratory: Negative for cough and shortness of breath.   Cardiovascular: Negative for chest pain and leg swelling.  Gastrointestinal: Negative for abdominal pain and diarrhea.  Genitourinary: Positive for difficulty urinating and frequency.  Musculoskeletal: Positive for arthralgias and back pain.  Skin: Negative for color change and rash.   Neurological: Negative for dizziness and headaches.  Psychiatric/Behavioral: Negative for behavioral problems and confusion.       Objective:   Physical Exam Vitals signs reviewed.  Constitutional:      General: He is not in acute distress. HENT:     Head: Normocephalic and atraumatic.  Eyes:     General:        Right eye: No discharge.        Left eye: No discharge.  Neck:     Trachea: No tracheal deviation.  Cardiovascular:     Rate and Rhythm: Normal rate and regular rhythm.     Heart sounds: Normal heart sounds. No murmur.  Pulmonary:     Effort: Pulmonary effort is normal. No respiratory distress.     Breath sounds: Normal breath sounds.  Lymphadenopathy:     Cervical: No cervical adenopathy.  Skin:    General: Skin is warm and dry.  Neurological:     Mental Status: He is alert.     Coordination: Coordination normal.  Psychiatric:        Behavior: Behavior normal.     Examination overall very good      Assessment & Plan:  Persistent respiratory mild COPD issues I believe the patient does have mild COPD related to his smoking his inhalers are helping he will continue this  Blood pressure good control continue current measures no need to increase medicine  Restless legs medication seems to be helping continue current measures  BPH continue Proscar continue seeing urology consider getting TURP Refills of medicines given follow-up 6 months

## 2019-08-23 LAB — BASIC METABOLIC PANEL
BUN/Creatinine Ratio: 14 (ref 10–24)
BUN: 18 mg/dL (ref 8–27)
CO2: 27 mmol/L (ref 20–29)
Calcium: 9.5 mg/dL (ref 8.6–10.2)
Chloride: 96 mmol/L (ref 96–106)
Creatinine, Ser: 1.3 mg/dL — ABNORMAL HIGH (ref 0.76–1.27)
GFR calc Af Amer: 59 mL/min/{1.73_m2} — ABNORMAL LOW (ref >59)
GFR calc non Af Amer: 51 mL/min/{1.73_m2} — ABNORMAL LOW (ref >59)
Glucose: 77 mg/dL (ref 65–99)
Potassium: 3.7 mmol/L (ref 3.5–5.2)
Sodium: 137 mmol/L (ref 134–144)

## 2019-08-26 ENCOUNTER — Encounter: Payer: Self-pay | Admitting: Family Medicine

## 2019-10-24 ENCOUNTER — Other Ambulatory Visit: Payer: Self-pay | Admitting: Family Medicine

## 2019-10-30 ENCOUNTER — Telehealth: Payer: Self-pay | Admitting: Family Medicine

## 2019-10-30 NOTE — Telephone Encounter (Signed)
Please advise. Thank you

## 2019-10-30 NOTE — Telephone Encounter (Signed)
Pt wants to know if Dr. Nicki Reaper thinks the moderna vaccine is ok for him to get. His pharmacy has them in stock

## 2019-10-30 NOTE — Telephone Encounter (Signed)
Pt contacted and verbalized understanding. Pt states he is going to call his pharmacy and set up an appointment to receive the vaccine.

## 2019-10-30 NOTE — Telephone Encounter (Signed)
By all means I highly recommend for Gerald Hall There is a 30% probability that it will cause some mild muscle aches low-grade fever and feel yucky for a day or 2 but it can dramatically lessen his risk of Covid Once again I highly recommend that he do the vaccine

## 2019-11-22 DIAGNOSIS — Z859 Personal history of malignant neoplasm, unspecified: Secondary | ICD-10-CM | POA: Diagnosis not present

## 2019-11-22 DIAGNOSIS — Z872 Personal history of diseases of the skin and subcutaneous tissue: Secondary | ICD-10-CM | POA: Diagnosis not present

## 2019-11-22 DIAGNOSIS — Z85828 Personal history of other malignant neoplasm of skin: Secondary | ICD-10-CM | POA: Diagnosis not present

## 2019-11-22 DIAGNOSIS — L720 Epidermal cyst: Secondary | ICD-10-CM | POA: Diagnosis not present

## 2019-11-22 DIAGNOSIS — L578 Other skin changes due to chronic exposure to nonionizing radiation: Secondary | ICD-10-CM | POA: Diagnosis not present

## 2019-11-22 DIAGNOSIS — L570 Actinic keratosis: Secondary | ICD-10-CM | POA: Diagnosis not present

## 2019-11-28 ENCOUNTER — Other Ambulatory Visit: Payer: Self-pay | Admitting: Family Medicine

## 2019-11-28 NOTE — Telephone Encounter (Signed)
Med check up 08/22/19

## 2019-11-28 NOTE — Telephone Encounter (Signed)
This +2 additional refills may pend

## 2019-12-19 ENCOUNTER — Other Ambulatory Visit: Payer: Self-pay | Admitting: Family Medicine

## 2020-01-12 ENCOUNTER — Other Ambulatory Visit: Payer: Self-pay | Admitting: Family Medicine

## 2020-01-21 ENCOUNTER — Ambulatory Visit: Payer: Medicare Other | Admitting: Family Medicine

## 2020-01-22 ENCOUNTER — Other Ambulatory Visit: Payer: Self-pay | Admitting: Family Medicine

## 2020-01-22 NOTE — Telephone Encounter (Signed)
Last med check up 08/22/19 

## 2020-01-23 ENCOUNTER — Ambulatory Visit (INDEPENDENT_AMBULATORY_CARE_PROVIDER_SITE_OTHER): Payer: Medicare Other | Admitting: Family Medicine

## 2020-01-23 VITALS — BP 128/80 | Temp 97.2°F | Wt 193.0 lb

## 2020-01-23 DIAGNOSIS — H9190 Unspecified hearing loss, unspecified ear: Secondary | ICD-10-CM | POA: Diagnosis not present

## 2020-01-23 DIAGNOSIS — I10 Essential (primary) hypertension: Secondary | ICD-10-CM

## 2020-01-23 DIAGNOSIS — E876 Hypokalemia: Secondary | ICD-10-CM

## 2020-01-23 DIAGNOSIS — R413 Other amnesia: Secondary | ICD-10-CM

## 2020-01-23 DIAGNOSIS — E785 Hyperlipidemia, unspecified: Secondary | ICD-10-CM | POA: Diagnosis not present

## 2020-01-23 DIAGNOSIS — R5383 Other fatigue: Secondary | ICD-10-CM

## 2020-01-23 DIAGNOSIS — Z79899 Other long term (current) drug therapy: Secondary | ICD-10-CM

## 2020-01-23 DIAGNOSIS — R899 Unspecified abnormal finding in specimens from other organs, systems and tissues: Secondary | ICD-10-CM

## 2020-01-23 DIAGNOSIS — R0600 Dyspnea, unspecified: Secondary | ICD-10-CM

## 2020-01-23 MED ORDER — TRAMADOL HCL 50 MG PO TABS: ORAL_TABLET | ORAL | 5 refills | Status: DC

## 2020-01-23 MED ORDER — TAMSULOSIN HCL 0.4 MG PO CAPS: 0.4000 mg | ORAL_CAPSULE | Freq: Every day | ORAL | 1 refills | Status: DC

## 2020-01-23 MED ORDER — FLUTICASONE PROPIONATE 50 MCG/ACT NA SUSP: NASAL | 4 refills | Status: DC

## 2020-01-23 MED ORDER — BUDESONIDE-FORMOTEROL FUMARATE 160-4.5 MCG/ACT IN AERO: 2.0000 | INHALATION_SPRAY | Freq: Two times a day (BID) | RESPIRATORY_TRACT | 1 refills | Status: DC

## 2020-01-23 MED ORDER — CARVEDILOL 3.125 MG PO TABS: ORAL_TABLET | ORAL | 1 refills | Status: DC

## 2020-01-23 MED ORDER — FINASTERIDE 5 MG PO TABS: 5.0000 mg | ORAL_TABLET | Freq: Every day | ORAL | 1 refills | Status: DC

## 2020-01-23 MED ORDER — HYDROCHLOROTHIAZIDE 25 MG PO TABS: 25.0000 mg | ORAL_TABLET | Freq: Every day | ORAL | 1 refills | Status: DC

## 2020-01-23 MED ORDER — ROPINIROLE HCL 2 MG PO TABS: 4.0000 mg | ORAL_TABLET | Freq: Every day | ORAL | 1 refills | Status: DC

## 2020-01-23 MED ORDER — ALBUTEROL SULFATE HFA 108 (90 BASE) MCG/ACT IN AERS: 2.0000 | INHALATION_SPRAY | RESPIRATORY_TRACT | 3 refills | Status: DC | PRN

## 2020-01-23 MED ORDER — POTASSIUM CHLORIDE CRYS ER 10 MEQ PO TBCR: EXTENDED_RELEASE_TABLET | ORAL | 1 refills | Status: DC

## 2020-01-23 NOTE — Progress Notes (Signed)
Subjective:    Patient ID: Gerald Hall, male    DOB: 23-Jun-1935, 85 y.o.   MRN: [id number]  HPI Patient seen today for check up on hypertension. HTN (hypertension), benign - Plan: Basic metabolic panel  Hearing loss, unspecified hearing loss type, unspecified laterality - Plan: Ambulatory referral to ENT  Short-term memory loss  Other fatigue - Plan: CBC with Differential/Platelet, TSH  Hyperlipidemia, unspecified hyperlipidemia type - Plan: Lipid panel  Dyspnea, unspecified type  Hypokalemia - Plan: Basic metabolic panel  High risk medication use - Plan: CBC with Differential/Platelet, Hepatic function panel  Abnormal laboratory test - Plan: TSH, T4, free  The patient relates he is having a moderate amount of fatigue tiredness he notices that his muscle mass is not as much as what it was he gets out of breath with activity he still smokes he does not do much in way of walking he has to use a walker to get around and he is limited to walking within the house he has not had any falls He also relates a lot of short-term memory loss he is worried about the possibility of underlying dementia.  But at the same time though he denies being depressed.  He does have a history of electrolyte issues Pt states he is getting weaker and his memory is awful. Fall Risk  08/22/2019 09/08/2018 11/18/2015 08/18/2015 09/03/2014  Falls in the past year? 0 0 No Yes No  Comment - Emmi Telephone Survey: data to providers prior to load - - -  Number falls in past yr: - - - 1 -  Injury with Fall? - - - No -  Risk for fall due to : Impaired balance/gait;Impaired mobility - - (No Data) -  Risk for fall due to: Comment - - - at night, walker turned over -  Follow up Falls evaluation completed - - - -     Review of Systems  Constitutional: Negative for diaphoresis and fatigue.  HENT: Negative for congestion and rhinorrhea.   Respiratory: Negative for cough and shortness of breath.   Cardiovascular:  Negative for chest pain and leg swelling.  Gastrointestinal: Negative for abdominal pain and diarrhea.  Skin: Negative for color change and rash.  Neurological: Negative for dizziness and headaches.  Psychiatric/Behavioral: Negative for behavioral problems and confusion.       Objective:   Physical Exam Vitals reviewed.  Constitutional:      General: He is not in acute distress. HENT:     Head: Normocephalic and atraumatic.  Eyes:     General:        Right eye: No discharge.        Left eye: No discharge.  Neck:     Trachea: No tracheal deviation.  Cardiovascular:     Rate and Rhythm: Normal rate and regular rhythm.     Heart sounds: Normal heart sounds. No murmur.  Pulmonary:     Effort: Pulmonary effort is normal. No respiratory distress.     Breath sounds: Normal breath sounds.  Lymphadenopathy:     Cervical: No cervical adenopathy.  Skin:    General: Skin is warm and dry.  Neurological:     Mental Status: He is alert.     Coordination: Coordination normal.  Psychiatric:        Behavior: Behavior normal.           Assessment & Plan:  1. HTN (hypertension), benign Good control keep om meds - Basic metabolic panel Blood pressure  on recheck looks good 2. Hearing loss, unspecified hearing loss type, unspecified laterality Severe hearing loss in left and right ear referral - Ambulatory referral to ENT Eardrums look good referral may need to hearing aid 3. Short-term memory loss [place] 27/30,mild short term loss, not dementia will monitor Does well on [place] cognitive assessment I believe this is just mild short-term memory loss not dementia at this point no medications indicated 4. Other fatigue Age releated and deconditioning - CBC with Differential/Platelet - TSH Lab work indicated 5. Hyperlipidemia, unspecified hyperlipidemia type Continue meds check labs - Lipid panel Age related and deconditioning 6. Dyspnea, unspecified type Related to copd  quit tob continue meds  7. Hypokalemia Check labs - Basic metabolic panel  8. High risk medication use Check labs - CBC with Differential/Platelet - Hepatic function panel

## 2020-01-23 NOTE — Telephone Encounter (Signed)
This +4 refills

## 2020-01-24 LAB — HEPATIC FUNCTION PANEL
ALT: 12 IU/L (ref 0–44)
AST: 13 IU/L (ref 0–40)
Albumin: 4 g/dL (ref 3.6–4.6)
Alkaline Phosphatase: 97 IU/L (ref 39–117)
Bilirubin Total: 0.5 mg/dL (ref 0.0–1.2)
Bilirubin, Direct: 0.2 mg/dL (ref 0.00–0.40)
Total Protein: 6.8 g/dL (ref 6.0–8.5)

## 2020-01-24 LAB — CBC WITH DIFFERENTIAL/PLATELET
Basophils Absolute: 0.1 10*3/uL (ref 0.0–0.2)
Basos: 1 %
EOS (ABSOLUTE): 0.3 10*3/uL (ref 0.0–0.4)
Eos: 3 %
Hematocrit: 48.6 % (ref 37.5–51.0)
Hemoglobin: 17.1 g/dL (ref 13.0–17.7)
Immature Grans (Abs): 0.1 10*3/uL (ref 0.0–0.1)
Immature Granulocytes: 1 %
Lymphocytes Absolute: 2.4 10*3/uL (ref 0.7–3.1)
Lymphs: 22 %
MCH: 32.9 pg (ref 26.6–33.0)
MCHC: 35.2 g/dL (ref 31.5–35.7)
MCV: 94 fL (ref 79–97)
Monocytes Absolute: 1.5 10*3/uL — ABNORMAL HIGH (ref 0.1–0.9)
Monocytes: 14 %
Neutrophils Absolute: 6.5 10*3/uL (ref 1.4–7.0)
Neutrophils: 59 %
Platelets: 218 10*3/uL (ref 150–450)
RBC: 5.2 x10E6/uL (ref 4.14–5.80)
RDW: 13.4 % (ref 11.6–15.4)
WBC: 10.7 10*3/uL (ref 3.4–10.8)

## 2020-01-24 LAB — BASIC METABOLIC PANEL
BUN/Creatinine Ratio: 12 (ref 10–24)
BUN: 17 mg/dL (ref 8–27)
CO2: 27 mmol/L (ref 20–29)
Calcium: 9.5 mg/dL (ref 8.6–10.2)
Chloride: 97 mmol/L (ref 96–106)
Creatinine, Ser: 1.42 mg/dL — ABNORMAL HIGH (ref 0.76–1.27)
GFR calc Af Amer: 53 mL/min/{1.73_m2} — ABNORMAL LOW (ref >59)
GFR calc non Af Amer: 46 mL/min/{1.73_m2} — ABNORMAL LOW (ref >59)
Glucose: 89 mg/dL (ref 65–99)
Potassium: 3.5 mmol/L (ref 3.5–5.2)
Sodium: 139 mmol/L (ref 134–144)

## 2020-01-24 LAB — LIPID PANEL
Chol/HDL Ratio: 4.7 ratio (ref 0.0–5.0)
Cholesterol, Total: 191 mg/dL (ref 100–199)
HDL: 41 mg/dL (ref >39)
LDL Chol Calc (NIH): 121 mg/dL — ABNORMAL HIGH (ref 0–99)
Triglycerides: 161 mg/dL — ABNORMAL HIGH (ref 0–149)
VLDL Cholesterol Cal: 29 mg/dL (ref 5–40)

## 2020-01-24 LAB — TSH: TSH: 0.376 u[IU]/mL — ABNORMAL LOW (ref 0.450–4.500)

## 2020-01-25 ENCOUNTER — Encounter: Payer: Self-pay | Admitting: Family Medicine

## 2020-01-30 ENCOUNTER — Encounter: Payer: Self-pay | Admitting: Family Medicine

## 2020-01-30 ENCOUNTER — Encounter: Payer: Self-pay | Admitting: Cardiology

## 2020-01-30 ENCOUNTER — Other Ambulatory Visit: Payer: Self-pay

## 2020-01-30 ENCOUNTER — Ambulatory Visit: Payer: Medicare Other | Admitting: Cardiology

## 2020-01-30 VITALS — BP 114/72 | HR 64 | Ht 69.0 in | Wt 190.0 lb

## 2020-01-30 DIAGNOSIS — I429 Cardiomyopathy, unspecified: Secondary | ICD-10-CM | POA: Diagnosis not present

## 2020-01-30 DIAGNOSIS — I471 Supraventricular tachycardia: Secondary | ICD-10-CM | POA: Diagnosis not present

## 2020-01-30 NOTE — Progress Notes (Signed)
Cardiology Office Note  Date: 01/30/2020   ID: Gerald Hall, Gerald Hall 1935-08-09, MRN [id number]  PCP:  Kathyrn Drown, MD  Cardiologist:  Rozann Lesches, MD Electrophysiologist:  None   Chief Complaint  Patient presents with  . Cardiac follow-up    History of Present Illness: Gerald Hall is an 85 y.o. male last seen in October 2020.  He presents for a routine visit.  He tells me that he has not had any significant palpitations on current medical regimen.  He continues on Coreg, amiodarone, HCTZ, and aspirin.  I reviewed his recent lab work which showed normal LFTs and TSH.  Past Medical History:  Diagnosis Date  . Arthritis   . Balance problem    Uses a walker  . Blood in urine    Sees urology yearly  . Deafness in right ear age 10  . Hyperlipidemia   . Hypertension   . PSVT (paroxysmal supraventricular tachycardia) ([place])   . Temporal arteritis ([place]) 2016  . Urinary incontinence     Past Surgical History:  Procedure Laterality Date  . BACK SURGERY  2010  . CATARACT EXTRACTION Bilateral 07/30/14, 08/13/2014  . CERVICAL SPINE SURGERY  09/01/15   C 4-5 , [place]  . COLONOSCOPY  12/19/2007   RMR: 1. External hemorrohids, otherwise normal rectum.2. Normal colon. 3. Normal terminal ileum.  . COLONOSCOPY N/A 12/13/2014   Procedure: COLONOSCOPY;  Surgeon: Daneil Dolin, MD;  [facility];  Service: Endoscopy;  Laterality: N/A;  11:15 Pt Request Time  . KNEE SURGERY Left    around 2000, arthroscopic  . TOTAL HIP ARTHROPLASTY Right 02/23/2017   Procedure: TOTAL HIP ARTHROPLASTY ANTERIOR APPROACH;  Surgeon: Hessie Knows, MD;  Location: [facility] ORS;  Service: Orthopedics;  Laterality: Right;    Current Outpatient Medications  Medication Sig Dispense Refill  . albuterol (VENTOLIN HFA) 108 (90 Base) MCG/ACT inhaler Inhale 2 puffs into the lungs every 4 (four) hours as needed for wheezing. 18 g 3  . amiodarone (PACERONE) 200 MG tablet Take 1 tablet  (200 mg total) by mouth daily. 90 tablet 3  . aspirin EC 325 MG EC tablet Take 1 tablet (325 mg total) by mouth daily. 30 tablet 0  . budesonide-formoterol (SYMBICORT) 160-4.5 MCG/ACT inhaler Inhale 2 puffs into the lungs 2 (two) times daily. 10.2 g 1  . carvedilol (COREG) 3.125 MG tablet TAKE (1) TABLET BY MOUTH TWICE A DAY WITH MEALS (BREAKFAST AND SUPPER) 180 tablet 1  . cholecalciferol (VITAMIN D) 400 units TABS tablet Take 400 Units by mouth daily.     Marland Kitchen Dextromethorphan-guaiFENesin (MUCINEX DM) 30-600 MG TB12 Take 1 tablet by mouth 2 (two) times a day.    . finasteride (PROSCAR) 5 MG tablet Take 1 tablet (5 mg total) by mouth daily. One tab daily 90 tablet 1  . fluticasone (FLONASE) 50 MCG/ACT nasal spray SPRAY 2 SPRAYS INTO EACH NOSTRIL ONCE DAILY 16 g 4  . hydrochlorothiazide (HYDRODIURIL) 25 MG tablet Take 1 tablet (25 mg total) by mouth daily. 90 tablet 1  . potassium chloride (KLOR-CON) 10 MEQ tablet TAKE 1 TABLET BY MOUTH TWICE DAILY 180 tablet 1  . rOPINIRole (REQUIP) 2 MG tablet Take 2 tablets (4 mg total) by mouth at bedtime. TAKE 1 TABLET BY MOUTH AT LATE AFTERNOON AND TAKE ONE TABLET AT BEDTIME 180 tablet 1  . tamsulosin (FLOMAX) 0.4 MG CAPS capsule Take 1 capsule (0.4 mg total) by mouth at bedtime. 90 capsule  1  . traMADol (ULTRAM) 50 MG tablet TAKE (1) TABLET BY MOUTH EVERY TWELVE HOURS AS NEEDED. 60 tablet 5  . vitamin C (ASCORBIC ACID) 500 MG tablet Take 500 mg by mouth daily.    . Zinc Acetate, Oral, (ZINC ACETATE PO) Take 1 tablet by mouth daily.     No current facility-administered medications for this visit.   Allergies:  Patient has no known allergies.   ROS:   Hearing loss.  Physical Exam: VS:  BP 114/72   Pulse 64   Ht 5\' 9"  (1.753 m)   Wt 190 lb (86.2 kg)   SpO2 97%   BMI 28.06 kg/m , BMI Body mass index is 28.06 kg/m.  Wt Readings from Last 3 Encounters:  01/30/20 190 lb (86.2 kg)  01/23/20 193 lb (87.5 kg)  08/22/19 184 lb (83.5 kg)    General:  Elderly male, appears comfortable at rest. HEENT: Conjunctiva and lids normal, wearing a mask. Neck: Supple, no elevated JVP or carotid bruits, no thyromegaly. Lungs: Clear to auscultation, nonlabored breathing at rest. Cardiac: Regular rate and rhythm, no S3 or significant systolic murmur. Abdomen: Soft, nontender, bowel sounds present. Extremities: Mild ankle edema, distal pulses 2+.  ECG:  An ECG dated 04/12/2019 was personally reviewed today and demonstrated:  Poor quality tracing showing probable sinus rhythm with prolonged PR interval.  Recent Labwork: 02/23/2019: Magnesium 2.1 01/23/2020: ALT 12; AST 13; BUN 17; Creatinine, Ser 1.42; Hemoglobin 17.1; Platelets 218; Potassium 3.5; Sodium 139; TSH 0.376     Component Value Date/Time   CHOL 191 01/23/2020 1455   TRIG 161 (H) 01/23/2020 1455   HDL 41 01/23/2020 1455   CHOLHDL 4.7 01/23/2020 1455   CHOLHDL 3.7 09/06/2014 1259   VLDL 29 09/06/2014 1259   LDLCALC 121 (H) 01/23/2020 1455    Other Studies Reviewed Today:  Echocardiogram 02/24/2019: 1. The left ventricle has mildly reduced systolic function, with an ejection fraction of 45-50%. The cavity size was normal. 2. The right ventricle has normal systolc function. The cavity was mildly enlarged. There is no increase in right ventricular wall thickness. 3. The mitral valve is abnormal. Mild thickening of the mitral valve leaflet. 4. The tricuspid valve was grossly normal. 5. The aortic valve is tricuspid Mild thickening of the aortic valve Mild calcification of the aortic valve. 6. The aortic root is normal in size and structure. 7. The interatrial septum was not assessed.  14-day event recorder 05/17/2019: 14-day event recorder reviewed. Predominant rhythm is sinus with heart rate ranging from 50 bpm up to 107 bpm, average heart rate 68 bpm. There were 7 episodes of NSVT noted, 4-5 beats at most. Brief episode of ventricular bigeminy noted. No sustained ventricular  arrhythmias. Several episodes of SVT were also noted, largely regular narrow complex tachycardia which could represent either atrial flutter or possibly a reentrant mechanism or atrial tachycardia. There were no pauses.  Assessment and Plan:  1.  PSVT.  He does not report any active palpitations at this time.  We will continue low-dose amiodarone and Coreg.  I reviewed his recent lab work from March at which point LFTs and TSH were normal.  We will obtain an ECG for his next visit.  2.  Secondary cardiomyopathy, LVEF 45 to 50%.  He remains on Coreg.  Blood pressure is normal today.  Medication Adjustments/Labs and Tests Ordered: Current medicines are reviewed at length with the patient today.  Concerns regarding medicines are outlined above.   Tests Ordered: No orders  of the defined types were placed in this encounter.   Medication Changes: No orders of the defined types were placed in this encounter.   Disposition:  Follow up 6 months in the [place] office.  Signed, Satira Sark, MD, [facility] 01/30/2020 2:39 PM    [place] at [facility] [address] [place], [place] [address] Phone: [phone]; Fax: [phone]

## 2020-01-30 NOTE — Patient Instructions (Signed)

## 2020-02-08 ENCOUNTER — Telehealth: Payer: Self-pay | Admitting: Family Medicine

## 2020-02-08 NOTE — Telephone Encounter (Signed)
Pharmacy is calling to make sure directions are correct on tamsulosin (FLOMAX) 0.4 MG CAPS capsule   Pt states he use to take it twice a day. He wasn't aware it would be change.

## 2020-02-11 MED ORDER — TAMSULOSIN HCL 0.4 MG PO CAPS: ORAL_CAPSULE | ORAL | 1 refills | Status: DC

## 2020-02-11 NOTE — Telephone Encounter (Signed)
Nurses Touch base with patient If taking 2/day that is fine may continue (I believe patient was taken 1 twice a day, may have this a.m. 5 refills) If having any trouble let me know

## 2020-02-11 NOTE — Telephone Encounter (Signed)
Pt contacted. Pt states he has been taking 2 capsules for the past year. New script with updated directions sent to pharmacy.

## 2020-02-27 ENCOUNTER — Other Ambulatory Visit: Payer: Self-pay | Admitting: Cardiology

## 2020-03-06 DIAGNOSIS — H6121 Impacted cerumen, right ear: Secondary | ICD-10-CM | POA: Diagnosis not present

## 2020-03-06 DIAGNOSIS — H90A21 Sensorineural hearing loss, unilateral, right ear, with restricted hearing on the contralateral side: Secondary | ICD-10-CM | POA: Insufficient documentation

## 2020-03-06 DIAGNOSIS — H938X2 Other specified disorders of left ear: Secondary | ICD-10-CM | POA: Diagnosis not present

## 2020-03-26 ENCOUNTER — Other Ambulatory Visit: Payer: Self-pay | Admitting: Family Medicine

## 2020-03-26 NOTE — Telephone Encounter (Signed)
6 refills °

## 2020-03-26 NOTE — Telephone Encounter (Signed)
Last med check up 01/23/20

## 2020-04-17 DIAGNOSIS — Z961 Presence of intraocular lens: Secondary | ICD-10-CM | POA: Diagnosis not present

## 2020-04-17 DIAGNOSIS — H47091 Other disorders of optic nerve, not elsewhere classified, right eye: Secondary | ICD-10-CM | POA: Diagnosis not present

## 2020-04-17 DIAGNOSIS — H3554 Dystrophies primarily involving the retinal pigment epithelium: Secondary | ICD-10-CM | POA: Diagnosis not present

## 2020-04-17 DIAGNOSIS — H04123 Dry eye syndrome of bilateral lacrimal glands: Secondary | ICD-10-CM | POA: Diagnosis not present

## 2020-04-17 DIAGNOSIS — I776 Arteritis, unspecified: Secondary | ICD-10-CM | POA: Diagnosis not present

## 2020-04-29 ENCOUNTER — Ambulatory Visit: Payer: Medicare Other | Admitting: Family Medicine

## 2020-05-02 ENCOUNTER — Other Ambulatory Visit: Payer: Self-pay | Admitting: Family Medicine

## 2020-05-02 NOTE — Telephone Encounter (Signed)
Please confirm twice daily with the patient if so he may have 180 with 1 refill

## 2020-05-06 NOTE — Telephone Encounter (Signed)
Called pt and he states he is taking it twice a day. States dr Jeffie Pollock increased it to one bid. Refill sent and pt notified.

## 2020-05-12 ENCOUNTER — Other Ambulatory Visit: Payer: Self-pay | Admitting: Family Medicine

## 2020-05-12 ENCOUNTER — Other Ambulatory Visit: Payer: Self-pay

## 2020-05-12 ENCOUNTER — Ambulatory Visit (INDEPENDENT_AMBULATORY_CARE_PROVIDER_SITE_OTHER): Payer: Medicare Other | Admitting: Family Medicine

## 2020-05-12 ENCOUNTER — Encounter: Payer: Self-pay | Admitting: Family Medicine

## 2020-05-12 VITALS — Temp 97.2°F | Wt 190.8 lb

## 2020-05-12 DIAGNOSIS — M7021 Olecranon bursitis, right elbow: Secondary | ICD-10-CM

## 2020-05-12 DIAGNOSIS — D692 Other nonthrombocytopenic purpura: Secondary | ICD-10-CM

## 2020-05-12 DIAGNOSIS — I10 Essential (primary) hypertension: Secondary | ICD-10-CM

## 2020-05-12 DIAGNOSIS — R2689 Other abnormalities of gait and mobility: Secondary | ICD-10-CM

## 2020-05-12 DIAGNOSIS — R531 Weakness: Secondary | ICD-10-CM

## 2020-05-12 NOTE — Patient Instructions (Signed)
Watch this area where we drew the fluid off If redness swelling and pain that can indicate infection This is unlikely to occur Use the ace wrap for the next 3 to 4 days If any hand swelling then losen the wrap  See you in 3 months! Sooner if you need [place]

## 2020-05-12 NOTE — Progress Notes (Signed)
   Subjective:    Patient ID: PARRY PO, male    DOB: [birth year], 85 y.o.   MRN: [id number]  Hypertension This is a chronic problem. Risk factors for coronary artery disease include male gender. There are no compliance problems.   Pt taking meds as directed with no issues. Pt checks blood pressure at home BID.   Right shoulder has been bothering patient for a couple of weeks. Pt also has knot on right elbow that came up about 3-4 weeks ago. No pain.  Patient also relates a swollen area on the right elbow that he states is caused him no pain but it feels like there is fluid with he denies any injury.  Patient still smokes he has been counseled to quit states he is unlikely to quit Patient on medication for COPD issues also on medication for atrial fib Meds were reviewed today Review of Systems Denies chest tightness pressure pain does relate shortness of breath if he pushes himself too much also relates difficulty with balance uses a walker to get around has a difficult time getting up out of the chair because of weakness    Objective:   Physical Exam Lungs are clear respiratory rate normal heart irregular rate is controlled blood pressure good extremities no edema  Patient does have a olecranon bursa swelling     Assessment & Plan:  Right shoulder stiffness more than likely arthritis Tylenol as needed for discomfort gentle range of motion recommended  Olecranon bursa fluid-with patient's permission and consent area was numbed with 1% lidocaine cleaned with Betadine No. 18 gauge needle with syringe sucked off approximately 15 mL done under sterile technique Ace wrap was placed  Blood pressure good control continue current measures  Patient also has some weakness in his girdle muscles with standing recommend some physical therapy  Patient will do follow-up on the thyroid blood work in the next couple weeks  Follow-up in approximately 3 months

## 2020-05-21 DIAGNOSIS — E039 Hypothyroidism, unspecified: Secondary | ICD-10-CM | POA: Diagnosis not present

## 2020-05-21 DIAGNOSIS — R899 Unspecified abnormal finding in specimens from other organs, systems and tissues: Secondary | ICD-10-CM | POA: Diagnosis not present

## 2020-05-22 ENCOUNTER — Other Ambulatory Visit: Payer: Self-pay | Admitting: Family Medicine

## 2020-05-22 DIAGNOSIS — E785 Hyperlipidemia, unspecified: Secondary | ICD-10-CM

## 2020-05-22 DIAGNOSIS — R7989 Other specified abnormal findings of blood chemistry: Secondary | ICD-10-CM

## 2020-05-22 DIAGNOSIS — N289 Disorder of kidney and ureter, unspecified: Secondary | ICD-10-CM

## 2020-05-22 LAB — TSH: TSH: 0.658 u[IU]/mL (ref 0.450–4.500)

## 2020-05-22 LAB — T4, FREE: Free T4: 1.81 ng/dL — ABNORMAL HIGH (ref 0.82–1.77)

## 2020-05-23 DIAGNOSIS — I10 Essential (primary) hypertension: Secondary | ICD-10-CM | POA: Diagnosis not present

## 2020-05-23 DIAGNOSIS — M6281 Muscle weakness (generalized): Secondary | ICD-10-CM | POA: Diagnosis not present

## 2020-05-23 DIAGNOSIS — M19011 Primary osteoarthritis, right shoulder: Secondary | ICD-10-CM | POA: Diagnosis not present

## 2020-05-23 DIAGNOSIS — J449 Chronic obstructive pulmonary disease, unspecified: Secondary | ICD-10-CM | POA: Diagnosis not present

## 2020-05-23 DIAGNOSIS — R262 Difficulty in walking, not elsewhere classified: Secondary | ICD-10-CM | POA: Diagnosis not present

## 2020-05-23 DIAGNOSIS — H353130 Nonexudative age-related macular degeneration, bilateral, stage unspecified: Secondary | ICD-10-CM | POA: Diagnosis not present

## 2020-05-23 DIAGNOSIS — I4891 Unspecified atrial fibrillation: Secondary | ICD-10-CM | POA: Diagnosis not present

## 2020-05-28 DIAGNOSIS — L578 Other skin changes due to chronic exposure to nonionizing radiation: Secondary | ICD-10-CM | POA: Diagnosis not present

## 2020-05-28 DIAGNOSIS — Z859 Personal history of malignant neoplasm, unspecified: Secondary | ICD-10-CM | POA: Diagnosis not present

## 2020-05-28 DIAGNOSIS — Z85828 Personal history of other malignant neoplasm of skin: Secondary | ICD-10-CM | POA: Diagnosis not present

## 2020-05-28 DIAGNOSIS — H02403 Unspecified ptosis of bilateral eyelids: Secondary | ICD-10-CM | POA: Diagnosis not present

## 2020-05-28 DIAGNOSIS — L570 Actinic keratosis: Secondary | ICD-10-CM | POA: Diagnosis not present

## 2020-05-28 DIAGNOSIS — Z872 Personal history of diseases of the skin and subcutaneous tissue: Secondary | ICD-10-CM | POA: Diagnosis not present

## 2020-05-29 DIAGNOSIS — R262 Difficulty in walking, not elsewhere classified: Secondary | ICD-10-CM | POA: Diagnosis not present

## 2020-05-29 DIAGNOSIS — I10 Essential (primary) hypertension: Secondary | ICD-10-CM | POA: Diagnosis not present

## 2020-05-29 DIAGNOSIS — M6281 Muscle weakness (generalized): Secondary | ICD-10-CM | POA: Diagnosis not present

## 2020-05-29 DIAGNOSIS — M19011 Primary osteoarthritis, right shoulder: Secondary | ICD-10-CM | POA: Diagnosis not present

## 2020-05-29 DIAGNOSIS — H353130 Nonexudative age-related macular degeneration, bilateral, stage unspecified: Secondary | ICD-10-CM | POA: Diagnosis not present

## 2020-05-29 DIAGNOSIS — J449 Chronic obstructive pulmonary disease, unspecified: Secondary | ICD-10-CM | POA: Diagnosis not present

## 2020-05-29 DIAGNOSIS — I4891 Unspecified atrial fibrillation: Secondary | ICD-10-CM | POA: Diagnosis not present

## 2020-05-30 DIAGNOSIS — Z029 Encounter for administrative examinations, unspecified: Secondary | ICD-10-CM | POA: Diagnosis not present

## 2020-06-10 DIAGNOSIS — H353130 Nonexudative age-related macular degeneration, bilateral, stage unspecified: Secondary | ICD-10-CM | POA: Diagnosis not present

## 2020-06-10 DIAGNOSIS — J449 Chronic obstructive pulmonary disease, unspecified: Secondary | ICD-10-CM | POA: Diagnosis not present

## 2020-06-10 DIAGNOSIS — M6281 Muscle weakness (generalized): Secondary | ICD-10-CM | POA: Diagnosis not present

## 2020-06-10 DIAGNOSIS — I4891 Unspecified atrial fibrillation: Secondary | ICD-10-CM | POA: Diagnosis not present

## 2020-06-10 DIAGNOSIS — R262 Difficulty in walking, not elsewhere classified: Secondary | ICD-10-CM | POA: Diagnosis not present

## 2020-06-10 DIAGNOSIS — M19011 Primary osteoarthritis, right shoulder: Secondary | ICD-10-CM | POA: Diagnosis not present

## 2020-06-10 DIAGNOSIS — I10 Essential (primary) hypertension: Secondary | ICD-10-CM | POA: Diagnosis not present

## 2020-06-12 DIAGNOSIS — M19011 Primary osteoarthritis, right shoulder: Secondary | ICD-10-CM | POA: Diagnosis not present

## 2020-06-12 DIAGNOSIS — I4891 Unspecified atrial fibrillation: Secondary | ICD-10-CM | POA: Diagnosis not present

## 2020-06-12 DIAGNOSIS — I10 Essential (primary) hypertension: Secondary | ICD-10-CM | POA: Diagnosis not present

## 2020-06-12 DIAGNOSIS — H353130 Nonexudative age-related macular degeneration, bilateral, stage unspecified: Secondary | ICD-10-CM | POA: Diagnosis not present

## 2020-06-12 DIAGNOSIS — J449 Chronic obstructive pulmonary disease, unspecified: Secondary | ICD-10-CM | POA: Diagnosis not present

## 2020-06-12 DIAGNOSIS — M6281 Muscle weakness (generalized): Secondary | ICD-10-CM | POA: Diagnosis not present

## 2020-06-12 DIAGNOSIS — R262 Difficulty in walking, not elsewhere classified: Secondary | ICD-10-CM | POA: Diagnosis not present

## 2020-06-17 DIAGNOSIS — I10 Essential (primary) hypertension: Secondary | ICD-10-CM | POA: Diagnosis not present

## 2020-06-17 DIAGNOSIS — M19011 Primary osteoarthritis, right shoulder: Secondary | ICD-10-CM | POA: Diagnosis not present

## 2020-06-17 DIAGNOSIS — H353130 Nonexudative age-related macular degeneration, bilateral, stage unspecified: Secondary | ICD-10-CM | POA: Diagnosis not present

## 2020-06-17 DIAGNOSIS — R262 Difficulty in walking, not elsewhere classified: Secondary | ICD-10-CM | POA: Diagnosis not present

## 2020-06-17 DIAGNOSIS — J449 Chronic obstructive pulmonary disease, unspecified: Secondary | ICD-10-CM | POA: Diagnosis not present

## 2020-06-17 DIAGNOSIS — I4891 Unspecified atrial fibrillation: Secondary | ICD-10-CM | POA: Diagnosis not present

## 2020-06-17 DIAGNOSIS — M6281 Muscle weakness (generalized): Secondary | ICD-10-CM | POA: Diagnosis not present

## 2020-06-19 DIAGNOSIS — M6281 Muscle weakness (generalized): Secondary | ICD-10-CM | POA: Diagnosis not present

## 2020-06-19 DIAGNOSIS — I4891 Unspecified atrial fibrillation: Secondary | ICD-10-CM | POA: Diagnosis not present

## 2020-06-19 DIAGNOSIS — I10 Essential (primary) hypertension: Secondary | ICD-10-CM | POA: Diagnosis not present

## 2020-06-19 DIAGNOSIS — M19011 Primary osteoarthritis, right shoulder: Secondary | ICD-10-CM | POA: Diagnosis not present

## 2020-06-19 DIAGNOSIS — J449 Chronic obstructive pulmonary disease, unspecified: Secondary | ICD-10-CM | POA: Diagnosis not present

## 2020-06-19 DIAGNOSIS — R262 Difficulty in walking, not elsewhere classified: Secondary | ICD-10-CM | POA: Diagnosis not present

## 2020-06-19 DIAGNOSIS — H353130 Nonexudative age-related macular degeneration, bilateral, stage unspecified: Secondary | ICD-10-CM | POA: Diagnosis not present

## 2020-06-23 DIAGNOSIS — H353130 Nonexudative age-related macular degeneration, bilateral, stage unspecified: Secondary | ICD-10-CM | POA: Diagnosis not present

## 2020-06-23 DIAGNOSIS — I4891 Unspecified atrial fibrillation: Secondary | ICD-10-CM | POA: Diagnosis not present

## 2020-06-23 DIAGNOSIS — R262 Difficulty in walking, not elsewhere classified: Secondary | ICD-10-CM | POA: Diagnosis not present

## 2020-06-23 DIAGNOSIS — J449 Chronic obstructive pulmonary disease, unspecified: Secondary | ICD-10-CM | POA: Diagnosis not present

## 2020-06-23 DIAGNOSIS — M19011 Primary osteoarthritis, right shoulder: Secondary | ICD-10-CM | POA: Diagnosis not present

## 2020-06-23 DIAGNOSIS — M6281 Muscle weakness (generalized): Secondary | ICD-10-CM | POA: Diagnosis not present

## 2020-06-23 DIAGNOSIS — I10 Essential (primary) hypertension: Secondary | ICD-10-CM | POA: Diagnosis not present

## 2020-06-25 ENCOUNTER — Telehealth: Payer: Self-pay | Admitting: Family Medicine

## 2020-06-25 DIAGNOSIS — H353130 Nonexudative age-related macular degeneration, bilateral, stage unspecified: Secondary | ICD-10-CM | POA: Diagnosis not present

## 2020-06-25 DIAGNOSIS — M19011 Primary osteoarthritis, right shoulder: Secondary | ICD-10-CM | POA: Diagnosis not present

## 2020-06-25 DIAGNOSIS — J449 Chronic obstructive pulmonary disease, unspecified: Secondary | ICD-10-CM | POA: Diagnosis not present

## 2020-06-25 DIAGNOSIS — M6281 Muscle weakness (generalized): Secondary | ICD-10-CM | POA: Diagnosis not present

## 2020-06-25 DIAGNOSIS — F17210 Nicotine dependence, cigarettes, uncomplicated: Secondary | ICD-10-CM | POA: Diagnosis not present

## 2020-06-25 DIAGNOSIS — I4891 Unspecified atrial fibrillation: Secondary | ICD-10-CM | POA: Diagnosis not present

## 2020-06-25 DIAGNOSIS — I10 Essential (primary) hypertension: Secondary | ICD-10-CM | POA: Diagnosis not present

## 2020-06-25 DIAGNOSIS — R262 Difficulty in walking, not elsewhere classified: Secondary | ICD-10-CM | POA: Diagnosis not present

## 2020-06-25 NOTE — Telephone Encounter (Signed)
[facility] reporting that Lux fell this past Sunday 29th. Reported no injuries. Physical Therapy said if Dr feels his type of therapy needed to be changed to call the office.   Mark call [phone]

## 2020-06-26 NOTE — Telephone Encounter (Signed)
The patient has increased risk of falling because of muscle weakness in the legs as well as deconditioning.  Any physical therapy that can help him with ataxia muscle strength is fine with [place]

## 2020-06-26 NOTE — Telephone Encounter (Signed)
Contacted [facility] and gave verbal orders for any kind of physical therapy that can help is fine.

## 2020-07-03 DIAGNOSIS — M6281 Muscle weakness (generalized): Secondary | ICD-10-CM | POA: Diagnosis not present

## 2020-07-03 DIAGNOSIS — I10 Essential (primary) hypertension: Secondary | ICD-10-CM | POA: Diagnosis not present

## 2020-07-03 DIAGNOSIS — I4891 Unspecified atrial fibrillation: Secondary | ICD-10-CM | POA: Diagnosis not present

## 2020-07-03 DIAGNOSIS — F17210 Nicotine dependence, cigarettes, uncomplicated: Secondary | ICD-10-CM | POA: Diagnosis not present

## 2020-07-03 DIAGNOSIS — M19011 Primary osteoarthritis, right shoulder: Secondary | ICD-10-CM | POA: Diagnosis not present

## 2020-07-03 DIAGNOSIS — H02834 Dermatochalasis of left upper eyelid: Secondary | ICD-10-CM | POA: Diagnosis not present

## 2020-07-03 DIAGNOSIS — H02831 Dermatochalasis of right upper eyelid: Secondary | ICD-10-CM | POA: Diagnosis not present

## 2020-07-03 DIAGNOSIS — H57813 Brow ptosis, bilateral: Secondary | ICD-10-CM | POA: Diagnosis not present

## 2020-07-03 DIAGNOSIS — H353130 Nonexudative age-related macular degeneration, bilateral, stage unspecified: Secondary | ICD-10-CM | POA: Diagnosis not present

## 2020-07-03 DIAGNOSIS — H02403 Unspecified ptosis of bilateral eyelids: Secondary | ICD-10-CM | POA: Diagnosis not present

## 2020-07-03 DIAGNOSIS — J449 Chronic obstructive pulmonary disease, unspecified: Secondary | ICD-10-CM | POA: Diagnosis not present

## 2020-07-03 DIAGNOSIS — R262 Difficulty in walking, not elsewhere classified: Secondary | ICD-10-CM | POA: Diagnosis not present

## 2020-07-09 DIAGNOSIS — H353130 Nonexudative age-related macular degeneration, bilateral, stage unspecified: Secondary | ICD-10-CM | POA: Diagnosis not present

## 2020-07-09 DIAGNOSIS — I10 Essential (primary) hypertension: Secondary | ICD-10-CM | POA: Diagnosis not present

## 2020-07-09 DIAGNOSIS — R262 Difficulty in walking, not elsewhere classified: Secondary | ICD-10-CM | POA: Diagnosis not present

## 2020-07-09 DIAGNOSIS — M6281 Muscle weakness (generalized): Secondary | ICD-10-CM | POA: Diagnosis not present

## 2020-07-09 DIAGNOSIS — M19011 Primary osteoarthritis, right shoulder: Secondary | ICD-10-CM | POA: Diagnosis not present

## 2020-07-09 DIAGNOSIS — F17210 Nicotine dependence, cigarettes, uncomplicated: Secondary | ICD-10-CM | POA: Diagnosis not present

## 2020-07-09 DIAGNOSIS — I4891 Unspecified atrial fibrillation: Secondary | ICD-10-CM | POA: Diagnosis not present

## 2020-07-09 DIAGNOSIS — J449 Chronic obstructive pulmonary disease, unspecified: Secondary | ICD-10-CM | POA: Diagnosis not present

## 2020-07-11 DIAGNOSIS — I10 Essential (primary) hypertension: Secondary | ICD-10-CM | POA: Diagnosis not present

## 2020-07-11 DIAGNOSIS — R3915 Urgency of urination: Secondary | ICD-10-CM | POA: Diagnosis not present

## 2020-07-11 DIAGNOSIS — H353130 Nonexudative age-related macular degeneration, bilateral, stage unspecified: Secondary | ICD-10-CM | POA: Diagnosis not present

## 2020-07-11 DIAGNOSIS — M19011 Primary osteoarthritis, right shoulder: Secondary | ICD-10-CM | POA: Diagnosis not present

## 2020-07-11 DIAGNOSIS — F17210 Nicotine dependence, cigarettes, uncomplicated: Secondary | ICD-10-CM | POA: Diagnosis not present

## 2020-07-11 DIAGNOSIS — I4891 Unspecified atrial fibrillation: Secondary | ICD-10-CM | POA: Diagnosis not present

## 2020-07-11 DIAGNOSIS — M6281 Muscle weakness (generalized): Secondary | ICD-10-CM | POA: Diagnosis not present

## 2020-07-11 DIAGNOSIS — J449 Chronic obstructive pulmonary disease, unspecified: Secondary | ICD-10-CM | POA: Diagnosis not present

## 2020-07-11 DIAGNOSIS — R262 Difficulty in walking, not elsewhere classified: Secondary | ICD-10-CM | POA: Diagnosis not present

## 2020-07-14 DIAGNOSIS — I10 Essential (primary) hypertension: Secondary | ICD-10-CM | POA: Diagnosis not present

## 2020-07-14 DIAGNOSIS — M6281 Muscle weakness (generalized): Secondary | ICD-10-CM | POA: Diagnosis not present

## 2020-07-14 DIAGNOSIS — R262 Difficulty in walking, not elsewhere classified: Secondary | ICD-10-CM | POA: Diagnosis not present

## 2020-07-14 DIAGNOSIS — I4891 Unspecified atrial fibrillation: Secondary | ICD-10-CM | POA: Diagnosis not present

## 2020-07-14 DIAGNOSIS — J449 Chronic obstructive pulmonary disease, unspecified: Secondary | ICD-10-CM | POA: Diagnosis not present

## 2020-07-14 DIAGNOSIS — F17210 Nicotine dependence, cigarettes, uncomplicated: Secondary | ICD-10-CM | POA: Diagnosis not present

## 2020-07-14 DIAGNOSIS — M19011 Primary osteoarthritis, right shoulder: Secondary | ICD-10-CM | POA: Diagnosis not present

## 2020-07-14 DIAGNOSIS — H353130 Nonexudative age-related macular degeneration, bilateral, stage unspecified: Secondary | ICD-10-CM | POA: Diagnosis not present

## 2020-07-16 DIAGNOSIS — M19011 Primary osteoarthritis, right shoulder: Secondary | ICD-10-CM | POA: Diagnosis not present

## 2020-07-16 DIAGNOSIS — I4891 Unspecified atrial fibrillation: Secondary | ICD-10-CM | POA: Diagnosis not present

## 2020-07-16 DIAGNOSIS — H353130 Nonexudative age-related macular degeneration, bilateral, stage unspecified: Secondary | ICD-10-CM | POA: Diagnosis not present

## 2020-07-16 DIAGNOSIS — M6281 Muscle weakness (generalized): Secondary | ICD-10-CM | POA: Diagnosis not present

## 2020-07-16 DIAGNOSIS — F17210 Nicotine dependence, cigarettes, uncomplicated: Secondary | ICD-10-CM | POA: Diagnosis not present

## 2020-07-16 DIAGNOSIS — R262 Difficulty in walking, not elsewhere classified: Secondary | ICD-10-CM | POA: Diagnosis not present

## 2020-07-16 DIAGNOSIS — J449 Chronic obstructive pulmonary disease, unspecified: Secondary | ICD-10-CM | POA: Diagnosis not present

## 2020-07-16 DIAGNOSIS — I10 Essential (primary) hypertension: Secondary | ICD-10-CM | POA: Diagnosis not present

## 2020-07-30 ENCOUNTER — Other Ambulatory Visit: Payer: Self-pay | Admitting: Family Medicine

## 2020-08-05 DIAGNOSIS — R7989 Other specified abnormal findings of blood chemistry: Secondary | ICD-10-CM | POA: Diagnosis not present

## 2020-08-05 DIAGNOSIS — N289 Disorder of kidney and ureter, unspecified: Secondary | ICD-10-CM | POA: Diagnosis not present

## 2020-08-05 DIAGNOSIS — E785 Hyperlipidemia, unspecified: Secondary | ICD-10-CM | POA: Diagnosis not present

## 2020-08-06 ENCOUNTER — Telehealth: Payer: Self-pay

## 2020-08-06 LAB — LIPID PANEL
Chol/HDL Ratio: 4.6 ratio (ref 0.0–5.0)
Cholesterol, Total: 183 mg/dL (ref 100–199)
HDL: 40 mg/dL (ref >39)
LDL Chol Calc (NIH): 122 mg/dL — ABNORMAL HIGH (ref 0–99)
Triglycerides: 113 mg/dL (ref 0–149)
VLDL Cholesterol Cal: 21 mg/dL (ref 5–40)

## 2020-08-06 LAB — TSH: TSH: 0.379 u[IU]/mL — ABNORMAL LOW (ref 0.450–4.500)

## 2020-08-06 LAB — BASIC METABOLIC PANEL
BUN/Creatinine Ratio: 12 (ref 10–24)
BUN: 21 mg/dL (ref 8–27)
CO2: 28 mmol/L (ref 20–29)
Calcium: 9.7 mg/dL (ref 8.6–10.2)
Chloride: 99 mmol/L (ref 96–106)
Creatinine, Ser: 1.73 mg/dL — ABNORMAL HIGH (ref 0.76–1.27)
GFR calc Af Amer: 42 mL/min/{1.73_m2} — ABNORMAL LOW (ref >59)
GFR calc non Af Amer: 36 mL/min/{1.73_m2} — ABNORMAL LOW (ref >59)
Glucose: 96 mg/dL (ref 65–99)
Potassium: 3.8 mmol/L (ref 3.5–5.2)
Sodium: 139 mmol/L (ref 134–144)

## 2020-08-06 LAB — T4, FREE: Free T4: 1.79 ng/dL — ABNORMAL HIGH (ref 0.82–1.77)

## 2020-08-06 MED ORDER — AMIODARONE HCL 200 MG PO TABS: 100.0000 mg | ORAL_TABLET | Freq: Every day | ORAL | 3 refills | Status: DC

## 2020-08-06 NOTE — Telephone Encounter (Signed)
-----   Message from Satira Sark, MD sent at 08/06/2020  8:55 AM EDT ----- Not certain whether amiodarone is necessarily related, but given change in TSH I would suggest reducing amiodarone to 100 mg daily for now.  I will forward this to my nurse in [place]. ----- Message ----- From: Kathyrn Drown, MD Sent: 08/06/2020   8:48 AM EDT To: Satira Sark, MD, [facility]  Kidney functions are somewhat worsePlease have patient make sure he keeps his follow-up visit in October We will send a copy of this to Dr. Domenic Polite so that he is aware regarding from a cardiology point of view which may or may not affect what they are prescribing him

## 2020-08-06 NOTE — Telephone Encounter (Signed)
Patient notified,he will reduce amiodarone to 100 mg daily.

## 2020-08-12 ENCOUNTER — Telehealth: Payer: Self-pay | Admitting: *Deleted

## 2020-08-12 ENCOUNTER — Other Ambulatory Visit: Payer: Self-pay

## 2020-08-12 ENCOUNTER — Ambulatory Visit (INDEPENDENT_AMBULATORY_CARE_PROVIDER_SITE_OTHER): Payer: Medicare Other | Admitting: Family Medicine

## 2020-08-12 ENCOUNTER — Encounter: Payer: Self-pay | Admitting: Cardiology

## 2020-08-12 ENCOUNTER — Other Ambulatory Visit (HOSPITAL_COMMUNITY): Payer: Self-pay | Admitting: Family Medicine

## 2020-08-12 ENCOUNTER — Ambulatory Visit: Payer: Medicare Other | Admitting: Cardiology

## 2020-08-12 ENCOUNTER — Encounter: Payer: Self-pay | Admitting: Family Medicine

## 2020-08-12 VITALS — BP 132/68 | HR 68 | Ht 68.0 in | Wt 197.0 lb

## 2020-08-12 VITALS — BP 122/70 | HR 75 | Temp 98.4°F | Ht 68.0 in | Wt 189.0 lb

## 2020-08-12 DIAGNOSIS — I429 Cardiomyopathy, unspecified: Secondary | ICD-10-CM

## 2020-08-12 DIAGNOSIS — L989 Disorder of the skin and subcutaneous tissue, unspecified: Secondary | ICD-10-CM | POA: Diagnosis not present

## 2020-08-12 DIAGNOSIS — Z23 Encounter for immunization: Secondary | ICD-10-CM | POA: Diagnosis not present

## 2020-08-12 DIAGNOSIS — I471 Supraventricular tachycardia: Secondary | ICD-10-CM

## 2020-08-12 DIAGNOSIS — I10 Essential (primary) hypertension: Secondary | ICD-10-CM

## 2020-08-12 DIAGNOSIS — M542 Cervicalgia: Secondary | ICD-10-CM | POA: Diagnosis not present

## 2020-08-12 DIAGNOSIS — E0590 Thyrotoxicosis, unspecified without thyrotoxic crisis or storm: Secondary | ICD-10-CM

## 2020-08-12 NOTE — Progress Notes (Signed)
Cardiology Office Note  Date: 08/12/2020   ID: Socorro, Ebron 1934/11/21, MRN [id number]  PCP:  Kathyrn Drown, MD  Cardiologist:  Rozann Lesches, MD Electrophysiologist:  None   Chief Complaint  Patient presents with  . Cardiac follow-up    History of Present Illness: Gerald Hall is an 85 y.o. male last seen in April.  He presents for a routine visit.  Overall, reports no new cardiac symptoms, specifically no progressive sense of palpitations or dizziness.  He had a recent visit with Dr. Wolfgang Phoenix, lab work obtained.  TSH was decreased to 0.379, free T4 1.79.  I recommended reduction in amiodarone to 100 mg daily for now.  Dr. Wolfgang Phoenix also plans further surveillance testing.  I personally reviewed his ECG today which shows sinus rhythm with prolonged PR interval, low voltage, IVCD.  I reviewed his medications which are outlined below.  We also went over home blood pressure and heart rate checks.  In general his systolic is under 366 most of the time.  Past Medical History:  Diagnosis Date  . Arthritis   . Balance problem    Uses a walker  . Blood in urine    Sees urology yearly  . Deafness in right ear age 30  . Hyperlipidemia   . Hypertension   . PSVT (paroxysmal supraventricular tachycardia) ([place])   . Temporal arteritis ([place]) 2016  . Urinary incontinence     Past Surgical History:  Procedure Laterality Date  . BACK SURGERY  2010  . CATARACT EXTRACTION Bilateral 07/30/14, 08/13/2014  . CERVICAL SPINE SURGERY  09/01/15   C 4-5 , [place]  . COLONOSCOPY  12/19/2007   RMR: 1. External hemorrohids, otherwise normal rectum.2. Normal colon. 3. Normal terminal ileum.  . COLONOSCOPY N/A 12/13/2014   Procedure: COLONOSCOPY;  Surgeon: Daneil Dolin, MD;  [facility];  Service: Endoscopy;  Laterality: N/A;  11:15 Pt Request Time  . KNEE SURGERY Left    around 2000, arthroscopic  . TOTAL HIP ARTHROPLASTY Right 02/23/2017   Procedure: TOTAL HIP  ARTHROPLASTY ANTERIOR APPROACH;  Surgeon: Hessie Knows, MD;  Location: [facility] ORS;  Service: Orthopedics;  Laterality: Right;    Current Outpatient Medications  Medication Sig Dispense Refill  . amiodarone (PACERONE) 200 MG tablet Take 0.5 tablets (100 mg total) by mouth daily. 90 tablet 3  . aspirin EC 325 MG EC tablet Take 1 tablet (325 mg total) by mouth daily. 30 tablet 0  . carvedilol (COREG) 3.125 MG tablet TAKE (1) TABLET BY MOUTH TWICE A DAY WITH MEALS (BREAKFAST AND SUPPER) 180 tablet 0  . cholecalciferol (VITAMIN D) 400 units TABS tablet Take 400 Units by mouth daily.     Marland Kitchen Dextromethorphan-guaiFENesin (MUCINEX DM) 30-600 MG TB12 Take 1 tablet by mouth 2 (two) times a day.    . finasteride (PROSCAR) 5 MG tablet TAKE 1 TABLET BY MOUTH ONCE DAILY. 90 tablet 0  . hydrochlorothiazide (HYDRODIURIL) 25 MG tablet TAKE 1 TABLET BY MOUTH ONCE DAILY. 90 tablet 0  . mirabegron ER (MYRBETRIQ) 25 MG TB24 tablet daily.    . potassium chloride (KLOR-CON) 10 MEQ tablet TAKE 1 TABLET BY MOUTH TWICE DAILY 180 tablet 0  . rOPINIRole (REQUIP) 2 MG tablet Take 2 tablets (4 mg total) by mouth at bedtime. TAKE 1 TABLET BY MOUTH AT LATE AFTERNOON AND TAKE ONE TABLET AT BEDTIME 180 tablet 1  . SYMBICORT 160-4.5 MCG/ACT inhaler INHALE 2 PUFFS BY MOUTH TWICE DAILY  10.2 g 5  . tamsulosin (FLOMAX) 0.4 MG CAPS capsule TAKE (1) CAPSULE BY MOUTH TWICE DAILY. 180 capsule 1  . traMADol (ULTRAM) 50 MG tablet TAKE (1) TABLET BY MOUTH EVERY TWELVE HOURS AS NEEDED. 60 tablet 0  . vitamin C (ASCORBIC ACID) 500 MG tablet Take 500 mg by mouth daily.    . Zinc Acetate, Oral, (ZINC ACETATE PO) Take 1 tablet by mouth daily.     No current facility-administered medications for this visit.   Allergies:  Penicillins   ROS: No syncope.  Physical Exam: VS:  BP 132/68   Pulse 68   Ht 5\' 8"  (1.727 m)   Wt 197 lb (89.4 kg)   SpO2 94%   BMI 29.95 kg/m , BMI Body mass index is 29.95 kg/m.  Wt Readings from Last 3 Encounters:   08/12/20 197 lb (89.4 kg)  08/12/20 189 lb (85.7 kg)  05/12/20 190 lb 12.8 oz (86.5 kg)    General: Patient appears comfortable at rest. HEENT: Conjunctiva and lids normal, wearing a mask. Neck: Supple, no elevated JVP or carotid bruits, no thyromegaly. Lungs: Clear to auscultation, nonlabored breathing at rest. Cardiac: Regular rate and rhythm, no S3 or significant systolic murmur. Extremities: Mild ankle edema.  ECG:  An ECG dated 04/12/2019 was personally reviewed today and demonstrated:  Poor quality tracing showing probable sinus rhythm with prolonged PR interval.  Recent Labwork: 01/23/2020: ALT 12; AST 13; Hemoglobin 17.1; Platelets 218 08/05/2020: BUN 21; Creatinine, Ser 1.73; Potassium 3.8; Sodium 139; TSH 0.379     Component Value Date/Time   CHOL 183 08/05/2020 0932   TRIG 113 08/05/2020 0932   HDL 40 08/05/2020 0932   CHOLHDL 4.6 08/05/2020 0932   CHOLHDL 3.7 09/06/2014 1259   VLDL 29 09/06/2014 1259   LDLCALC 122 (H) 08/05/2020 0932    Other Studies Reviewed Today:  Echocardiogram 02/24/2019: 1. The left ventricle has mildly reduced systolic function, with an ejection fraction of 45-50%. The cavity size was normal. 2. The right ventricle has normal systolc function. The cavity was mildly enlarged. There is no increase in right ventricular wall thickness. 3. The mitral valve is abnormal. Mild thickening of the mitral valve leaflet. 4. The tricuspid valve was grossly normal. 5. The aortic valve is tricuspid Mild thickening of the aortic valve Mild calcification of the aortic valve. 6. The aortic root is normal in size and structure. 7. The interatrial septum was not assessed.  14-day event recorder 05/17/2019: 14-day event recorder reviewed. Predominant rhythm is sinus with heart rate ranging from 50 bpm up to 107 bpm, average heart rate 68 bpm. There were 7 episodes of NSVT noted, 4-5 beats at most. Brief episode of ventricular bigeminy noted. No sustained  ventricular arrhythmias. Several episodes of SVT were also noted, largely regular narrow complex tachycardia which could represent either atrial flutter or possibly a reentrant mechanism or atrial tachycardia. There were no pauses.  Assessment and Plan:  1.  PSVT.  He reports no progressive sense of palpitations.  I reviewed his ECG today.  Continue Coreg, amiodarone being reduced to 100 mg daily in light of recent TSH and free T4.  2.  Secondary cardiomyopathy, LVEF 45 to 50% by assessment last year.  He remains on Coreg, HCTZ with potassium supplements.  Medication Adjustments/Labs and Tests Ordered: Current medicines are reviewed at length with the patient today.  Concerns regarding medicines are outlined above.   Tests Ordered: Orders Placed This Encounter  Procedures  . EKG 12-Lead  Medication Changes: No orders of the defined types were placed in this encounter.   Disposition:  Follow up 6 months in the [place] office.  Signed, Satira Sark, MD, [facility] 08/12/2020 3:31 PM    [place] at [place], [place], [place] [address] Phone: [phone]; Fax: [phone]

## 2020-08-12 NOTE — Patient Instructions (Signed)

## 2020-08-12 NOTE — Progress Notes (Signed)
° °  Subjective:    Patient ID: Gerald Hall, male    DOB: 1934/10/28, 85 y.o.   MRN: [id number]  HPImed check up.   Concerns about pain on left side of neck for about 3 days.  Denies any injury denies any numbness down the arm.  Denies weakness Stopped aspirin a few days ago for upcoming surgery on eye lids.  Concerns about getting covid booster vaccine.  We did discuss the importance of getting this he will pursue forward with getting it done Check spot on right side of face. Came up several months ago.  Please see below Has upcoming surgery of his eyelids Recent lab work shows depressed TSH and elevated T4 indicating hyperthyroidism Needs further work-up   Review of Systems  Constitutional: Negative for activity change.  HENT: Negative for congestion and rhinorrhea.   Respiratory: Negative for cough and shortness of breath.   Cardiovascular: Negative for chest pain.  Gastrointestinal: Negative for abdominal pain, diarrhea, nausea and vomiting.  Genitourinary: Negative for dysuria and hematuria.  Neurological: Negative for weakness and headaches.  Psychiatric/Behavioral: Negative for behavioral problems and confusion.       Objective:   Physical Exam Vitals reviewed.  Constitutional:      General: He is not in acute distress. HENT:     Head: Normocephalic and atraumatic.  Eyes:     General:        Right eye: No discharge.        Left eye: No discharge.  Neck:     Trachea: No tracheal deviation.  Cardiovascular:     Rate and Rhythm: Normal rate and regular rhythm.     Heart sounds: Normal heart sounds. No murmur heard.   Pulmonary:     Effort: Pulmonary effort is normal. No respiratory distress.     Breath sounds: Normal breath sounds.  Lymphadenopathy:     Cervical: No cervical adenopathy.  Skin:    General: Skin is warm and dry.  Neurological:     Mental Status: He is alert.     Coordination: Coordination normal.  Psychiatric:        Behavior: Behavior  normal.   Benign skin lesion right side of face hard to tell if it is a developing actinic keratosis Mild neck discomfort left side of neck slight decreased range of motion no neurologic impingement no weakness Lungs are clear Heart rate controlled        Assessment & Plan:  1. Need for vaccination Flu vaccine today senior dose - Flu Vaccine QUAD High Dose(Fluad)  2. Skin lesion Skin lesion on the side of his face appears benign but it is best for him to see his dermatologist he states he will set this up  3. Cervical pain (neck) Musculoskeletal neck pain simple range of motion exercises recommended May use Voltaren gel as needed  4. HTN (hypertension), benign Blood pressure good control continue current measures  5. Hyperthyroidism Hyperthyroidism needs further testing difficult to tell if there is a nodule in his neck I would recommend the patient get a thyroid uptake scan  Patient has upcoming surgery of his eyelids we will talk with the surgical center at [facility] more than likely they will want to put this on hold until hyperthyroidism is under good control. Recheck patient in 3 months

## 2020-08-12 NOTE — Telephone Encounter (Addendum)
Dr Nicki Reaper ordered thyroid radiodine uptake scan on pt and wanted it done before his eye surgery on the oct 29th. Surgery is being done by dr zwang at [facility] at [facility]. I had to call [facility] because I was not sure how to put in the order and they put in order for me and went ahead and scheduled it for 10/27 at 9am. 3 parts to test. Will come at 9 am to take pill then at 1 for scan which will take about 45 mins and then come back next day at 9 am. Pt needs to be NPO after midnight. Tried to call pt and no answer to notify him of appt.

## 2020-08-12 NOTE — Telephone Encounter (Signed)
Discussed appt with pt and pt now states he cannot do the scan that day. I called [facility] back and canceled the scan and told them it would be scheduled after getting precert if one is needed. Courtney please let me know if it needs precert and if I need to schedule or if you can schedule. Surgery is on oct 29th and pt wanted to wait a couple of weeks after surgery to get it done.

## 2020-08-14 NOTE — Telephone Encounter (Signed)
Nurses This patient has outpatient surgery at [facility] in [place].  Please see if you can find the phone number for this facility so that I can call and talk with anesthesia there.  Thank you

## 2020-08-14 NOTE — Telephone Encounter (Signed)
[phone] 

## 2020-08-18 NOTE — Telephone Encounter (Signed)
Nurses 1.  I spoke with the surgical department at [facility] and they stated that his thyroid functions do not prevent him from having his surgery 2.  Please let the patient know that I did inform the surgical center about his thyroid and how it would be fine for him to still get his surgery completed #3 I still think it is important for the patient to do thyroid scan work with him to get this scheduled it has been approved by insurance thank you If he wants to get this in November I am fine with that

## 2020-08-18 NOTE — Telephone Encounter (Signed)
Patient advised per Dr Nicki Reaper: 1. Dr Nicki Reaper spoke with the surgical department at [facility] and they stated that his thyroid functions do not prevent him from having his surgery 2.  Dr Nicki Reaper did inform the surgical center about his thyroid and how it would be fine for him to still get his surgery completed #3 Dr Nicki Reaper still thinks it is important for the patient to do thyroid scan work with him to get this scheduled it has been approved by insurance thank you If he wants to get this in November he is fine with that  Patient verbalized understanding and is ok proceeding with thyroid testing and stated he received a letter from his insurance today stating it was ok also.

## 2020-08-20 ENCOUNTER — Other Ambulatory Visit (HOSPITAL_COMMUNITY): Payer: Medicare Other

## 2020-08-20 DIAGNOSIS — Z01812 Encounter for preprocedural laboratory examination: Secondary | ICD-10-CM | POA: Diagnosis not present

## 2020-08-20 NOTE — Telephone Encounter (Signed)
Noted thank you

## 2020-08-21 ENCOUNTER — Other Ambulatory Visit (HOSPITAL_COMMUNITY): Payer: Medicare Other

## 2020-08-22 DIAGNOSIS — H02834 Dermatochalasis of left upper eyelid: Secondary | ICD-10-CM | POA: Diagnosis not present

## 2020-08-22 DIAGNOSIS — Z9841 Cataract extraction status, right eye: Secondary | ICD-10-CM | POA: Diagnosis not present

## 2020-08-22 DIAGNOSIS — M1990 Unspecified osteoarthritis, unspecified site: Secondary | ICD-10-CM | POA: Diagnosis not present

## 2020-08-22 DIAGNOSIS — H02403 Unspecified ptosis of bilateral eyelids: Secondary | ICD-10-CM | POA: Diagnosis not present

## 2020-08-22 DIAGNOSIS — I4891 Unspecified atrial fibrillation: Secondary | ICD-10-CM | POA: Diagnosis not present

## 2020-08-22 DIAGNOSIS — Z83511 Family history of glaucoma: Secondary | ICD-10-CM | POA: Diagnosis not present

## 2020-08-22 DIAGNOSIS — Z7951 Long term (current) use of inhaled steroids: Secondary | ICD-10-CM | POA: Diagnosis not present

## 2020-08-22 DIAGNOSIS — Z880 Allergy status to penicillin: Secondary | ICD-10-CM | POA: Diagnosis not present

## 2020-08-22 DIAGNOSIS — Z961 Presence of intraocular lens: Secondary | ICD-10-CM | POA: Diagnosis not present

## 2020-08-22 DIAGNOSIS — H02831 Dermatochalasis of right upper eyelid: Secondary | ICD-10-CM | POA: Diagnosis not present

## 2020-08-22 DIAGNOSIS — Z79899 Other long term (current) drug therapy: Secondary | ICD-10-CM | POA: Diagnosis not present

## 2020-08-22 DIAGNOSIS — Z9842 Cataract extraction status, left eye: Secondary | ICD-10-CM | POA: Diagnosis not present

## 2020-08-22 DIAGNOSIS — R29898 Other symptoms and signs involving the musculoskeletal system: Secondary | ICD-10-CM | POA: Diagnosis not present

## 2020-08-22 DIAGNOSIS — F17210 Nicotine dependence, cigarettes, uncomplicated: Secondary | ICD-10-CM | POA: Diagnosis not present

## 2020-08-22 DIAGNOSIS — I10 Essential (primary) hypertension: Secondary | ICD-10-CM | POA: Diagnosis not present

## 2020-08-22 DIAGNOSIS — H57813 Brow ptosis, bilateral: Secondary | ICD-10-CM | POA: Diagnosis not present

## 2020-08-22 DIAGNOSIS — M316 Other giant cell arteritis: Secondary | ICD-10-CM | POA: Diagnosis not present

## 2020-08-22 DIAGNOSIS — Z7982 Long term (current) use of aspirin: Secondary | ICD-10-CM | POA: Diagnosis not present

## 2020-08-22 DIAGNOSIS — Z981 Arthrodesis status: Secondary | ICD-10-CM | POA: Diagnosis not present

## 2020-08-22 DIAGNOSIS — G9520 Unspecified cord compression: Secondary | ICD-10-CM | POA: Diagnosis not present

## 2020-08-22 DIAGNOSIS — H9191 Unspecified hearing loss, right ear: Secondary | ICD-10-CM | POA: Diagnosis not present

## 2020-09-01 DIAGNOSIS — C44319 Basal cell carcinoma of skin of other parts of face: Secondary | ICD-10-CM | POA: Diagnosis not present

## 2020-09-01 DIAGNOSIS — D485 Neoplasm of uncertain behavior of skin: Secondary | ICD-10-CM | POA: Diagnosis not present

## 2020-09-01 DIAGNOSIS — L821 Other seborrheic keratosis: Secondary | ICD-10-CM | POA: Diagnosis not present

## 2020-09-05 NOTE — Telephone Encounter (Signed)
error 

## 2020-09-08 ENCOUNTER — Encounter (HOSPITAL_COMMUNITY)
Admission: RE | Admit: 2020-09-08 | Discharge: 2020-09-08 | Disposition: A | Discharge status: Home / Self Care | Payer: Medicare Other | Source: Ambulatory Visit | Attending: Family Medicine | Admitting: Family Medicine

## 2020-09-08 ENCOUNTER — Other Ambulatory Visit: Payer: Self-pay

## 2020-09-08 DIAGNOSIS — E0590 Thyrotoxicosis, unspecified without thyrotoxic crisis or storm: Secondary | ICD-10-CM | POA: Insufficient documentation

## 2020-09-09 ENCOUNTER — Encounter (HOSPITAL_COMMUNITY)
Admission: RE | Admit: 2020-09-09 | Discharge: 2020-09-09 | Disposition: A | Discharge status: Home / Self Care | Payer: Medicare Other | Source: Ambulatory Visit | Attending: Family Medicine | Admitting: Family Medicine

## 2020-09-09 DIAGNOSIS — R221 Localized swelling, mass and lump, neck: Secondary | ICD-10-CM | POA: Diagnosis not present

## 2020-09-09 DIAGNOSIS — E069 Thyroiditis, unspecified: Secondary | ICD-10-CM | POA: Diagnosis not present

## 2020-09-09 DIAGNOSIS — G479 Sleep disorder, unspecified: Secondary | ICD-10-CM | POA: Diagnosis not present

## 2020-09-09 MED ORDER — SODIUM IODIDE I-123 7.4 MBQ CAPS
461.0000 | ORAL_CAPSULE | Freq: Once | ORAL | Status: AC
  Administered 2020-09-08: 461 via ORAL

## 2020-09-11 ENCOUNTER — Other Ambulatory Visit: Payer: Self-pay | Admitting: Family Medicine

## 2020-09-15 ENCOUNTER — Telehealth: Payer: Self-pay | Admitting: *Deleted

## 2020-09-15 ENCOUNTER — Other Ambulatory Visit: Payer: Self-pay | Admitting: Family Medicine

## 2020-09-15 ENCOUNTER — Telehealth: Payer: Self-pay | Admitting: Family Medicine

## 2020-09-15 DIAGNOSIS — E0590 Thyrotoxicosis, unspecified without thyrotoxic crisis or storm: Secondary | ICD-10-CM

## 2020-09-15 DIAGNOSIS — E069 Thyroiditis, unspecified: Secondary | ICD-10-CM

## 2020-09-15 MED ORDER — METHIMAZOLE 5 MG PO TABS: ORAL_TABLET | ORAL | 2 refills | Status: DC

## 2020-09-15 MED ORDER — DOXYCYCLINE HYCLATE 100 MG PO TABS: 100.0000 mg | ORAL_TABLET | Freq: Two times a day (BID) | ORAL | 0 refills | Status: DC

## 2020-09-15 NOTE — Telephone Encounter (Signed)
I reviewed over the scan It showed some mild thyroiditis. This is causing his thyroid to be overactive This is not cancer But does need consultation with endocrinology I recommend visit with Dr. Dorris Fetch  Let the patient know I will be able to call him but currently right now between a overbooked schedule and meetings I will not be able to call him back until late this evening or I can call him tomorrow in the lunch hour if that would work for him  Please find out from the patient if tomorrow at lunch would work if so please send message back to me, if he needs to speak with me today it will be late this evening unless there is something emergent going on that requires having it be now

## 2020-09-15 NOTE — Telephone Encounter (Signed)
Discussed with patient. Referral ordered in Epic.  Patient would like a call back this evening at [phone]- he is leaving to go out of town tomorrow and will not return for a week

## 2020-09-15 NOTE — Telephone Encounter (Signed)
Nurses I will need to do a phone visit with him tomorrow at 1150 Please mark him as arrived there is no need to call him I had a long discussion with him this evening  Please allow me to speak with Dr.Nida regarding this patient and hyperthyroidism on amiodarone thank you

## 2020-09-15 NOTE — Telephone Encounter (Signed)
Pt calling for thyroid test results that was done last week. I told pt results were back and I would send message back for dr scott to review and he also asked that dr scott call him back because he had another question for dr scott. I asked pt to let me know the question since dr scott was seeing pt's and he states he just wanted to talk with dr scott directly.

## 2020-09-15 NOTE — Telephone Encounter (Signed)
Front Please add him to my schedule at 1150 on Tuesday thank you

## 2020-09-16 ENCOUNTER — Other Ambulatory Visit: Payer: Self-pay | Admitting: Family Medicine

## 2020-09-16 ENCOUNTER — Telehealth (INDEPENDENT_AMBULATORY_CARE_PROVIDER_SITE_OTHER): Payer: Medicare Other | Admitting: Family Medicine

## 2020-09-16 ENCOUNTER — Other Ambulatory Visit: Payer: Self-pay

## 2020-09-16 DIAGNOSIS — E061 Subacute thyroiditis: Secondary | ICD-10-CM

## 2020-09-16 DIAGNOSIS — E0580 Other thyrotoxicosis without thyrotoxic crisis or storm: Secondary | ICD-10-CM

## 2020-09-16 NOTE — Telephone Encounter (Signed)
Has appointment today

## 2020-09-16 NOTE — Progress Notes (Signed)
   Subjective:    Patient ID: JACOBUS COLVIN, male    DOB: 1935-01-09, 85 y.o.   MRN: [id number]  HPI   Virtual Visit via Video Note  I connected with [name] on 09/19/20 at 11:50 AM EST by a video enabled telemedicine application and verified that I am speaking with the correct person using two identifiers.  Location: Patient: Home Provider: Office   I discussed the limitations of evaluation and management by telemedicine and the availability of in person appointments. The patient expressed understanding and agreed to proceed.  History of Present Illness:    Observations/Objective:   Assessment and Plan:   Follow Up Instructions:    I discussed the assessment and treatment plan with the patient. The patient was provided an opportunity to ask questions and all were answered. The patient agreed with the plan and demonstrated an understanding of the instructions.   The patient was advised to call back or seek an in-person evaluation if the symptoms worsen or if the condition fails to improve as anticipated.  I provided 30 between documentation and discussion with consultant and discussion with the patient minutes of non-face-to-face time during this encounter.   Sallee Lange, MD   Discussion of thyroid issues.  Thyroid uptake shows what appears to be possible thyroiditis.  TSH elevated.  Could well be related to the amiodarone.  Cardiology is already reduce the dose.  I did discuss the case with endocrinology.  Dr. Dorris Fetch will be seeing the patient in the coming weeks.  He gave some recommendations as stated below.  Patient does relate that he has had increased heart rate but denies any headaches chest pains issues associated with this.   Review of Systems Please see above    Objective:   Physical Exam  Today's visit was via telephone Physical exam was not possible for this visit       Assessment & Plan:  Thyroiditis Hyperthyroidism related to  amiodarone Tapazole 5 mg daily Per Dr. Liliane Channel advice low-dose prednisone for the next 15 days Endocrinology will be seeing the patient in the coming days Patient will be out of town the week of November 29. Patient will follow-up sooner if any problems

## 2020-09-17 ENCOUNTER — Other Ambulatory Visit: Payer: Self-pay | Admitting: Family Medicine

## 2020-09-17 MED ORDER — PREDNISONE 5 MG PO TABS: ORAL_TABLET | ORAL | 0 refills | Status: DC

## 2020-09-17 NOTE — Telephone Encounter (Signed)
Autumn, do you mind placing the referral for this patient for [place] to see? We do not have anything in our workque. Thanks

## 2020-09-17 NOTE — Telephone Encounter (Signed)
There referral was ordered in [place] yesterday- Message sent to referral coordinator to send to Dr Liliane Channel Erma Heritage

## 2020-09-17 NOTE — Telephone Encounter (Signed)
Patient was added to schedule- Dr Dorris Fetch to return Dr Bary Leriche call

## 2020-09-22 NOTE — Telephone Encounter (Signed)
Our referral coordinator Earma Reading has been notified and is working on this currently.

## 2020-09-22 NOTE — Telephone Encounter (Signed)
Per Dr Dorris Fetch- we need the referral placed into our workque so that we are able to schedule this patient. Thank you

## 2020-09-24 ENCOUNTER — Ambulatory Visit: Payer: Medicare Other | Admitting: "Endocrinology

## 2020-09-25 ENCOUNTER — Ambulatory Visit: Payer: Medicare Other | Admitting: "Endocrinology

## 2020-09-25 ENCOUNTER — Other Ambulatory Visit: Payer: Self-pay

## 2020-09-25 ENCOUNTER — Encounter: Payer: Self-pay | Admitting: "Endocrinology

## 2020-09-25 VITALS — BP 120/66 | HR 80 | Ht 68.0 in | Wt 191.0 lb

## 2020-09-25 DIAGNOSIS — E061 Subacute thyroiditis: Secondary | ICD-10-CM | POA: Diagnosis not present

## 2020-09-25 NOTE — Progress Notes (Signed)
Endocrinology Consult Note                                            09/25/2020, 4:40 PM   Subjective:    Patient ID: Gerald Hall, male    DOB: 1935/03/25, PCP Kathyrn Drown, MD   Past Medical History:  Diagnosis Date   Arthritis    Balance problem    Uses a walker   Blood in urine    Sees urology yearly   Deafness in right ear age 85   Hyperlipidemia    Hypertension    PSVT (paroxysmal supraventricular tachycardia) ([place])    Temporal arteritis ([place]) 2016   Urinary incontinence    Past Surgical History:  Procedure Laterality Date   BACK SURGERY  2010   CATARACT EXTRACTION Bilateral 07/30/14, 08/13/2014   CERVICAL SPINE SURGERY  09/01/15   C 4-5 , [place]   COLONOSCOPY  12/19/2007   RMR: 1. External hemorrohids, otherwise normal rectum.2. Normal colon. 3. Normal terminal ileum.   COLONOSCOPY N/A 12/13/2014   Procedure: COLONOSCOPY;  Surgeon: Daneil Dolin, MD;  Location: [facility];  Service: Endoscopy;  Laterality: N/A;  11:15 Pt Request Time   KNEE SURGERY Left    around 2000, arthroscopic   TOTAL HIP ARTHROPLASTY Right 02/23/2017   Procedure: TOTAL HIP ARTHROPLASTY ANTERIOR APPROACH;  Surgeon: Hessie Knows, MD;  Location: [facility] ORS;  Service: Orthopedics;  Laterality: Right;   Social History   Socioeconomic History   Marital status: Married    Spouse name: Pamala Hurry   Number of children: 2   Years of education: 16   Highest education level: Not on file  Occupational History    Comment: retired Software engineer  Tobacco Use   Smoking status: Current Every Day Smoker    Packs/day: 0.50    Types: Cigarettes   Smokeless tobacco: Never Used   Tobacco comment: 08/18/15 1/2 - 3/4 pack cigarettes daily  Vaping Use   Vaping Use: Never used  Substance and Sexual Activity   Alcohol use: Yes    Alcohol/week: 0.0 standard drinks    Comment: mixed drink 1-2 times per month, social   Drug use: No   Sexual activity: Not on  file  Other Topics Concern   Not on file  Social History Narrative   Lives at home with wife, who has PD   Caffeine use- coffee 8-10 cups daily   Social Determinants of Health   Financial Resource Strain:    Difficulty of Paying Living Expenses: Not on file  Food Insecurity:    Worried About [employer] in the Last Year: Not on file   [employer] of Food in the Last Year: Not on file  Transportation Needs:    Lack of Transportation (Medical): Not on file   Lack of Transportation (Non-Medical): Not on file  Physical Activity:    Days of Exercise per Week: Not on file   Minutes of Exercise per Session: Not on file  Stress:    Feeling of Stress : Not on file  Social Connections:    Frequency of Communication with Friends and Family: Not on file   Frequency of Social Gatherings with Friends and Family: Not on file   Attends Religious Services: Not on file   Active Member of Clubs or Organizations: Not on  file   Attends Archivist Meetings: Not on file   Marital Status: Not on file   Family History  Problem Relation Age of Onset   Colon cancer Brother    Diabetes Brother    Diabetes Brother    Outpatient Encounter Medications as of 09/25/2020  Medication Sig   neomycin-polymyxin-dexameth (MAXITROL) 0.1 % OINT Apply a thin layer to the left upper eyelid 3 times a day.   amiodarone (PACERONE) 200 MG tablet Take 0.5 tablets (100 mg total) by mouth daily.   aspirin EC 325 MG EC tablet Take 1 tablet (325 mg total) by mouth daily.   carvedilol (COREG) 3.125 MG tablet TAKE (1) TABLET BY MOUTH TWICE A DAY WITH MEALS (BREAKFAST AND SUPPER)   cephALEXin (KEFLEX) 500 MG capsule Take 500 mg by mouth 2 (two) times daily.   cholecalciferol (VITAMIN D) 400 units TABS tablet Take 400 Units by mouth daily.    Dextromethorphan-guaiFENesin (MUCINEX DM) 30-600 MG TB12 Take 1 tablet by mouth daily.    finasteride (PROSCAR) 5 MG tablet TAKE 1 TABLET BY MOUTH  ONCE DAILY.   hydrochlorothiazide (HYDRODIURIL) 25 MG tablet TAKE 1 TABLET BY MOUTH ONCE DAILY.   mirabegron ER (MYRBETRIQ) 25 MG TB24 tablet daily.   potassium chloride (KLOR-CON) 10 MEQ tablet TAKE 1 TABLET BY MOUTH TWICE DAILY   predniSONE (DELTASONE) 5 MG tablet Take one tablet po daily for 15 days   rOPINIRole (REQUIP) 2 MG tablet Take 2 tablets (4 mg total) by mouth at bedtime. TAKE 1 TABLET BY MOUTH AT LATE AFTERNOON AND TAKE ONE TABLET AT BEDTIME   SYMBICORT 160-4.5 MCG/ACT inhaler INHALE 2 PUFFS BY MOUTH TWICE DAILY   tamsulosin (FLOMAX) 0.4 MG CAPS capsule TAKE (1) CAPSULE BY MOUTH TWICE DAILY.   traMADol (ULTRAM) 50 MG tablet TAKE (1) TABLET BY MOUTH EVERY TWELVE HOURS AS NEEDED.   vitamin C (ASCORBIC ACID) 500 MG tablet Take 500 mg by mouth daily.   Zinc Acetate, Oral, (ZINC ACETATE PO) Take 1 tablet by mouth daily.   [DISCONTINUED] doxycycline (VIBRA-TABS) 100 MG tablet Take 1 tablet (100 mg total) by mouth 2 (two) times daily.   [DISCONTINUED] methimazole (TAPAZOLE) 5 MG tablet 1 to 2 tabs qd as directed   [DISCONTINUED] potassium chloride (KLOR-CON) 10 MEQ tablet TAKE 1 TABLET BY MOUTH TWICE DAILY   No facility-administered encounter medications on file as of 09/25/2020.   ALLERGIES: Allergies  Allergen Reactions   Penicillins     GI UPSET     VACCINATION STATUS: Immunization History  Administered Date(s) Administered   Fluad Quad(high Dose 65+) 08/12/2020   Influenza,inj,Quad PF,6+ Mos 07/20/2016, 08/07/2018, 08/22/2019   Influenza-Unspecified 09/03/2013   Moderna SARS-COVID-2 Vaccination 10/30/2019, 11/27/2019   Pneumococcal Conjugate-13 09/03/2014   Pneumococcal Polysaccharide-23 07/20/2016   Zoster Recombinat (Shingrix) 09/26/2017    HPI Gerald Hall is 85 y.o. male who presents today with a medical history as above. he is being seen in consultation for abnormal thyroid function tests requested by Kathyrn Drown, MD.  History is obtained  directly from the patient as well as from chart review.  Starting from March 2021, he was observed to have suppressed TSH.  In July and October his free T4 was also elevated to the highest level of 1.81.  Patient reports symptoms of fluctuating body weight, anxiety, palpitations on the background of atrial fibrillation.  He was initiated on methimazole, currently 5 mg p.o. daily. More recently he was put on prednisone 5 mg p.o. daily.  Of note, he was treated with amiodarone to maximum of 200 mg p.o. daily in relation to his atrial fibrillation.  He does not have previous thyroid dysfunction.  His thyroid uptake and scan on September 09, 2020 revealed low uptake of 1.3% in 24 hours.  He remains active smoker.  He does not have acute complaints today.  Review of Systems  Constitutional: + Fluctuating body weight, + fatigue, no subjective hyperthermia, no subjective hypothermia, + walks with a walker due to disequilibrium Eyes: no blurry vision, no xerophthalmia ENT: no sore throat, no nodules palpated in throat, no dysphagia/odynophagia, no hoarseness Cardiovascular: no Chest Pain, + Shortness of Breath, no palpitations, no leg swelling Respiratory: + cough, no shortness of breath Gastrointestinal: no Nausea/Vomiting/Diarhhea Musculoskeletal: no muscle/joint aches Skin: no rashes Neurological: no tremors, no numbness, no tingling, no dizziness Psychiatric: no depression, no anxiety  Objective:    Vitals with BMI 09/25/2020 08/12/2020 08/12/2020  Height 5\' 8"  5\' 8"  5\' 8"   Weight 191 lbs 197 lbs 189 lbs  BMI 29.05 50.56 97.94  Systolic 801 655 374  Diastolic 66 68 70  Pulse 80 68 75    BP 120/66    Pulse 80    Ht 5\' 8"  (1.727 m)    Wt 191 lb (86.6 kg)    BMI 29.04 kg/m   Wt Readings from Last 3 Encounters:  09/25/20 191 lb (86.6 kg)  08/12/20 197 lb (89.4 kg)  08/12/20 189 lb (85.7 kg)    Physical Exam  Constitutional:  Body mass index is 29.04 kg/m.,  not in acute distress, normal  state of mind, + relates with a walker. Eyes: PERRLA, EOMI, no exophthalmos ENT: moist mucous membranes, no gross thyromegaly, no gross cervical lymphadenopathy Cardiovascular: normal precordial activity, Regular Rate and Rhythm, no Murmur/Rubs/Gallops Respiratory:  adequate breathing efforts, + diffuse wheezes on bilateral lung fields,  Gastrointestinal: abdomen soft, Non -tender, No distension, Bowel Sounds present, no gross organomegaly Musculoskeletal: no gross deformities, strength intact in all four extremities Skin: moist, warm, no rashes Neurological: no tremor with outstretched hands, Deep tendon reflexes normal in bilateral lower extremities.  CMP ( most recent) CMP     Component Value Date/Time   NA 139 08/05/2020 0932   K 3.8 08/05/2020 0932   CL 99 08/05/2020 0932   CO2 28 08/05/2020 0932   GLUCOSE 96 08/05/2020 0932   GLUCOSE 104 (H) 02/24/2019 0548   BUN 21 08/05/2020 0932   CREATININE 1.73 (H) 08/05/2020 0932   CREATININE 1.14 09/06/2014 1259   CALCIUM 9.7 08/05/2020 0932   PROT 6.8 01/23/2020 1455   ALBUMIN 4.0 01/23/2020 1455   AST 13 01/23/2020 1455   ALT 12 01/23/2020 1455   ALKPHOS 97 01/23/2020 1455   BILITOT 0.5 01/23/2020 1455   GFRNONAA 36 (L) 08/05/2020 0932   GFRAA 42 (L) 08/05/2020 0932     Diabetic Labs (most recent): Lab Results  Component Value Date   HGBA1C 5.6 08/18/2015     Lipid Panel ( most recent) Lipid Panel     Component Value Date/Time   CHOL 183 08/05/2020 0932   TRIG 113 08/05/2020 0932   HDL 40 08/05/2020 0932   CHOLHDL 4.6 08/05/2020 0932   CHOLHDL 3.7 09/06/2014 1259   VLDL 29 09/06/2014 1259   LDLCALC 122 (H) 08/05/2020 0932   LABVLDL 21 08/05/2020 0932      Lab Results  Component Value Date   TSH 0.379 (L) 08/05/2020   TSH 0.658 05/21/2020   TSH 0.376 (  L) 01/23/2020   TSH 1.047 02/23/2019   TSH 0.752 08/06/2015   FREET4 1.79 (H) 08/05/2020   FREET4 1.81 (H) 05/21/2020        Thyroid uptake and scan from  September 09, 2020  FINDINGS: Very low uptake within thyroid gland. 4 hour I-123 uptake = 1.3% (normal 5-20%),  24 hour I-123 uptake = 1.3% (normal 10-30%)  IMPRESSION: Very low uptake within the thyroid gland. Recommend clinical correlation for subacute thyroiditis.   Assessment & Plan:   1. Subacute thyroiditis - Gerald Hall  is being seen at a kind request of Luking, Elayne Snare, MD. - I have reviewed his available thyroid records and clinically evaluated the patient. - Based on these reviews, he has subacute thyroiditis most likely related to the background history of amiodarone treatment.  He likely had transient thyrotoxicosis related to his subacute thyroiditis.    In light of his significantly low uptake of 1.3% in 24 hours on September 09, 2020, he will not need antithyroid intervention at this time.  He is allowed to finish his current supplies of prednisone 5 mg p.o. daily. He will have repeat thyroid function test and office visit in 9 weeks. -I discussed the fact that amiodarone can still be utilized as his cardiologist sees fit and will manage the thyroid dysfunction on the side as necessary. Smoking puts this patient at higher risk of health complications including COPD. The patient was counseled on the dangers of tobacco use, and was advised to quit.  Reviewed strategies to maximize success, including removing cigarettes and smoking materials from environment.  Evidently, he is not ready to quit smoking. -If he returns with  - I did not initiate any new prescriptions today. - he is advised to maintain close follow up with Kathyrn Drown, MD for primary care needs.   - Time spent with the patient: 45 minutes, of which >50% was spent in  counseling him about his subacute thyroiditis related to transient thyrotoxicosis and the rest in obtaining information about his symptoms, reviewing his previous labs/studies ( including abstractions from other facilities),  evaluations,  and treatments,  and developing a plan to confirm diagnosis and long term treatment based on the latest standards of care/guidelines; and documenting his care.  Gerald Hall participated in the discussions, expressed understanding, and voiced agreement with the above plans.  All questions were answered to his satisfaction. he is encouraged to contact clinic should he have any questions or concerns prior to his return visit.  Follow up plan: Return in about 9 weeks (around 11/27/2020) for F/U with Pre-visit Labs.   Glade Lloyd, MD [facility] [address] [place], [place] [address] Phone: [phone]  Fax: [phone]     09/25/2020, 4:40 PM  This note was partially dictated with voice recognition software. Similar sounding words can be transcribed inadequately or may not  be corrected upon review.

## 2020-10-15 DIAGNOSIS — L578 Other skin changes due to chronic exposure to nonionizing radiation: Secondary | ICD-10-CM | POA: Diagnosis not present

## 2020-10-15 DIAGNOSIS — L570 Actinic keratosis: Secondary | ICD-10-CM | POA: Diagnosis not present

## 2020-10-15 DIAGNOSIS — L814 Other melanin hyperpigmentation: Secondary | ICD-10-CM | POA: Diagnosis not present

## 2020-10-15 DIAGNOSIS — C44329 Squamous cell carcinoma of skin of other parts of face: Secondary | ICD-10-CM | POA: Diagnosis not present

## 2020-10-15 DIAGNOSIS — L821 Other seborrheic keratosis: Secondary | ICD-10-CM | POA: Diagnosis not present

## 2020-10-23 DIAGNOSIS — H0289 Other specified disorders of eyelid: Secondary | ICD-10-CM | POA: Diagnosis not present

## 2020-11-08 ENCOUNTER — Other Ambulatory Visit: Payer: Self-pay | Admitting: Family Medicine

## 2020-11-11 NOTE — Telephone Encounter (Signed)
6 months refill in each

## 2020-11-12 ENCOUNTER — Encounter: Payer: Self-pay | Admitting: Family Medicine

## 2020-11-12 ENCOUNTER — Other Ambulatory Visit: Payer: Self-pay

## 2020-11-12 ENCOUNTER — Ambulatory Visit (INDEPENDENT_AMBULATORY_CARE_PROVIDER_SITE_OTHER): Payer: Medicare Other | Admitting: Family Medicine

## 2020-11-12 VITALS — BP 124/78 | HR 72 | Temp 97.5°F | Wt 192.2 lb

## 2020-11-12 DIAGNOSIS — I429 Cardiomyopathy, unspecified: Secondary | ICD-10-CM

## 2020-11-12 DIAGNOSIS — M87051 Idiopathic aseptic necrosis of right femur: Secondary | ICD-10-CM

## 2020-11-12 DIAGNOSIS — I471 Supraventricular tachycardia, unspecified: Secondary | ICD-10-CM

## 2020-11-12 DIAGNOSIS — J432 Centrilobular emphysema: Secondary | ICD-10-CM

## 2020-11-12 DIAGNOSIS — N289 Disorder of kidney and ureter, unspecified: Secondary | ICD-10-CM

## 2020-11-12 DIAGNOSIS — D692 Other nonthrombocytopenic purpura: Secondary | ICD-10-CM | POA: Diagnosis not present

## 2020-11-12 NOTE — Progress Notes (Signed)
Subjective:    Patient ID: Gerald Hall, male    DOB: [birth year], 86 y.o.   MRN: [id number]  HPI Patient coming in to follow up on hyperthyroidism. Patient has been seeing Dr. Dorris Fetch for this. Fall Risk  11/12/2020 08/12/2020 01/23/2020 08/22/2019 09/08/2018  Falls in the past year? 1 1 0 0 0  Comment - - - - Emmi Telephone Survey: data to providers prior to load  Number falls in past yr: 1 0 - - -  Injury with Fall? 0 0 0 - -  Risk for fall due to : - History of fall(s) History of fall(s) Impaired balance/gait;Impaired mobility -  Risk for fall due to: Comment - - - - -  Follow up Falls evaluation completed Falls evaluation completed Falls evaluation completed Falls evaluation completed -     Renal insufficiency - Plan: Basic metabolic panel  Centrilobular emphysema (HCC)  Secondary cardiomyopathy (HCC)  Avascular necrosis of bone of right hip (HCC), Chronic  PSVT (paroxysmal supraventricular tachycardia) (HCC), Chronic  Senile purpura ([place]), Chronic  Patient continues to have fragile skin with bruising on the arms and occasional skin tears Patient also had underlying heart issues cardiomyopathy and does the best he can is on amiodarone Has secondary hypothyroidism being managed by endocrinology has not had any PSVT recently amiodarone is doing a good job keeping it under control he does have underlying COPD he has been counseled about smoking in the past he has no desire currently smoking No concerns today.    Review of Systems  Constitutional: Negative for activity change.  HENT: Negative for congestion and rhinorrhea.   Respiratory: Negative for cough and shortness of breath.   Cardiovascular: Negative for chest pain.  Gastrointestinal: Negative for abdominal pain, diarrhea, nausea and vomiting.  Genitourinary: Negative for dysuria and hematuria.  Neurological: Negative for weakness and headaches.  Psychiatric/Behavioral: Negative for behavioral problems and confusion.        Objective:   Physical Exam Vitals reviewed.  Constitutional:      Appearance: He is well-nourished.  Cardiovascular:     Rate and Rhythm: Normal rate and regular rhythm.     Heart sounds: Normal heart sounds. No murmur heard.   Pulmonary:     Effort: Pulmonary effort is normal.     Breath sounds: Normal breath sounds.  Musculoskeletal:        General: No edema.  Lymphadenopathy:     Cervical: No cervical adenopathy.  Neurological:     Mental Status: He is alert.  Psychiatric:        Behavior: Behavior normal.     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 Renal insufficiency See above - Basic metabolic panel  2. Centrilobular emphysema ([place]) Patient uses his inhalers he has been counseled to quit smoking  3. Secondary cardiomyopathy ([place]) Followed by cardiology denies any severe shortness of breath currently taking amiodarone tolerating it fairly well  4. Avascular necrosis of bone of right hip [facility]) Patient uses tramadol to help with  his pain and discomfort denies abuse of the medicine  5. PSVT (paroxysmal supraventricular tachycardia) (HCC) Has not had any PV SVT recently but keeps it under good control with amiodarone  6. Senile purpura (HCC) Does have bruising on his arms consistent with senile purpura no bleeding issues Patient will do a follow-up in 3 months

## 2020-11-24 DIAGNOSIS — E061 Subacute thyroiditis: Secondary | ICD-10-CM | POA: Diagnosis not present

## 2020-11-24 DIAGNOSIS — N289 Disorder of kidney and ureter, unspecified: Secondary | ICD-10-CM | POA: Diagnosis not present

## 2020-11-25 LAB — T4, FREE: Free T4: 1.6 ng/dL (ref 0.82–1.77)

## 2020-11-25 LAB — BASIC METABOLIC PANEL
BUN/Creatinine Ratio: 14 (ref 10–24)
BUN: 21 mg/dL (ref 8–27)
CO2: 27 mmol/L (ref 20–29)
Calcium: 9.4 mg/dL (ref 8.6–10.2)
Chloride: 99 mmol/L (ref 96–106)
Creatinine, Ser: 1.52 mg/dL — ABNORMAL HIGH (ref 0.76–1.27)
GFR calc Af Amer: 48 mL/min/{1.73_m2} — ABNORMAL LOW (ref >59)
GFR calc non Af Amer: 42 mL/min/{1.73_m2} — ABNORMAL LOW (ref >59)
Glucose: 125 mg/dL — ABNORMAL HIGH (ref 65–99)
Potassium: 3.7 mmol/L (ref 3.5–5.2)
Sodium: 137 mmol/L (ref 134–144)

## 2020-11-25 LAB — THYROGLOBULIN ANTIBODY: Thyroglobulin Antibody: <1 IU/mL (ref 0.0–0.9)

## 2020-11-25 LAB — T3, FREE: T3, Free: 2.5 pg/mL (ref 2.0–4.4)

## 2020-11-25 LAB — THYROID PEROXIDASE ANTIBODY: Thyroperoxidase Ab SerPl-aCnc: <8 IU/mL (ref 0–34)

## 2020-11-25 LAB — TSH: TSH: 0.652 u[IU]/mL (ref 0.450–4.500)

## 2020-11-27 ENCOUNTER — Other Ambulatory Visit: Payer: Self-pay

## 2020-11-27 ENCOUNTER — Encounter: Payer: Self-pay | Admitting: "Endocrinology

## 2020-11-27 ENCOUNTER — Ambulatory Visit: Payer: Medicare Other | Admitting: "Endocrinology

## 2020-11-27 VITALS — BP 138/78 | HR 68 | Ht 68.0 in | Wt 193.0 lb

## 2020-11-27 DIAGNOSIS — E061 Subacute thyroiditis: Secondary | ICD-10-CM

## 2020-11-27 NOTE — Progress Notes (Signed)
11/27/2020, 3:23 PM  Endocrinology follow-up note   Subjective:    Patient ID: Gerald Hall, male    DOB: 12-06-1934, PCP Gerald Drown, MD   Past Medical History:  Diagnosis Date  . Arthritis   . Balance problem    Uses a walker  . Blood in urine    Sees urology yearly  . Deafness in right ear age 86  . Hyperlipidemia   . Hypertension   . PSVT (paroxysmal supraventricular tachycardia) ([place])   . Temporal arteritis ([place]) 2016  . Urinary incontinence    Past Surgical History:  Procedure Laterality Date  . BACK SURGERY  2010  . CATARACT EXTRACTION Bilateral 07/30/14, 08/13/2014  . CERVICAL SPINE SURGERY  09/01/15   C 4-5 , [place]  . COLONOSCOPY  12/19/2007   RMR: 1. External hemorrohids, otherwise normal rectum.2. Normal colon. 3. Normal terminal ileum.  . COLONOSCOPY N/A 12/13/2014   Procedure: COLONOSCOPY;  Surgeon: Gerald Dolin, MD;  [facility];  Service: Endoscopy;  Laterality: N/A;  11:15 Pt Request Time  . KNEE SURGERY Left    around 2000, arthroscopic  . TOTAL HIP ARTHROPLASTY Right 02/23/2017   Procedure: TOTAL HIP ARTHROPLASTY ANTERIOR APPROACH;  Surgeon: Gerald Knows, MD;  Location: [facility] ORS;  Service: Orthopedics;  Laterality: Right;   Social History   Socioeconomic History  . Marital status: Married    Spouse name: Gerald Hall  . Number of children: 2  . Years of education: 63  . Highest education level: Not on file  Occupational History    Comment: retired Software engineer  Tobacco Use  . Smoking status: Current Every Day Smoker    Packs/day: 0.50    Types: Cigarettes  . Smokeless tobacco: Never Used  . Tobacco comment: 08/18/15 1/2 - 3/4 pack cigarettes daily  Vaping Use  . Vaping Use: Never used  Substance and Sexual Activity  . Alcohol use: Yes    Alcohol/week: 0.0 standard drinks    Comment: mixed drink 1-2 times per month, social  . Drug use: No  . Sexual activity:  Not on file  Other Topics Concern  . Not on file  Social History Narrative   Lives at home with wife, who has PD   Caffeine use- coffee 8-10 cups daily   Social Determinants of Health   Financial Resource Strain: Not on file  Food Insecurity: Not on file  Transportation Needs: Not on file  Physical Activity: Not on file  Stress: Not on file  Social Connections: Not on file   Family History  Problem Relation Age of Onset  . Colon cancer Brother   . Diabetes Brother   . Diabetes Brother    Outpatient Encounter Medications as of 11/27/2020  Medication Sig  . amiodarone (PACERONE) 200 MG tablet Take 0.5 tablets (100 mg total) by mouth daily.  Marland Kitchen aspirin EC 325 MG EC tablet Take 1 tablet (325 mg total) by mouth daily.  . carvedilol (COREG) 3.125 MG tablet TAKE (1) TABLET BY MOUTH TWICE A DAY WITH MEALS (BREAKFAST AND SUPPER)  . cholecalciferol (VITAMIN D) 400 units TABS tablet Take 400 Units by mouth daily.   Marland Kitchen Dextromethorphan-guaiFENesin (MUCINEX DM) 30-600 MG TB12  Take 1 tablet by mouth daily.   . finasteride (PROSCAR) 5 MG tablet TAKE 1 TABLET BY MOUTH ONCE DAILY.  . hydrochlorothiazide (HYDRODIURIL) 25 MG tablet TAKE 1 TABLET BY MOUTH ONCE DAILY.  . mirabegron ER (MYRBETRIQ) 25 MG TB24 tablet daily.  Marland Kitchen neomycin-polymyxin-dexameth (MAXITROL) 0.1 % OINT Apply a thin layer to the left upper eyelid 3 times a day. (Patient not taking: Reported on 11/27/2020)  . potassium chloride (KLOR-CON) 10 MEQ tablet TAKE 1 TABLET BY MOUTH TWICE DAILY  . rOPINIRole (REQUIP) 2 MG tablet Take 2 tablets (4 mg total) by mouth at bedtime. TAKE 1 TABLET BY MOUTH AT LATE AFTERNOON AND TAKE ONE TABLET AT BEDTIME  . SYMBICORT 160-4.5 MCG/ACT inhaler INHALE 2 PUFFS BY MOUTH TWICE DAILY  . tamsulosin (FLOMAX) 0.4 MG CAPS capsule TAKE (1) CAPSULE BY MOUTH TWICE DAILY.  . traMADol (ULTRAM) 50 MG tablet TAKE (1) TABLET BY MOUTH EVERY TWELVE HOURS AS NEEDED.  Marland Kitchen vitamin C (ASCORBIC ACID) 500 MG tablet Take 500 mg by  mouth daily.  . Zinc Acetate, Oral, (ZINC ACETATE PO) Take 1 tablet by mouth daily.  . [DISCONTINUED] potassium chloride (KLOR-CON) 10 MEQ tablet TAKE 1 TABLET BY MOUTH TWICE DAILY   No facility-administered encounter medications on file as of 11/27/2020.   ALLERGIES: Allergies  Allergen Reactions  . Penicillins     GI UPSET     VACCINATION STATUS: Immunization History  Administered Date(s) Administered  . Fluad Quad(high Dose 65+) 08/12/2020  . Influenza,inj,Quad PF,6+ Mos 07/20/2016, 08/07/2018, 08/22/2019  . Influenza-Unspecified 09/03/2013  . Moderna Sars-Covid-2 Vaccination 10/30/2019, 11/27/2019, 08/28/2020  . Pneumococcal Conjugate-13 09/03/2014  . Pneumococcal Polysaccharide-23 07/20/2016  . Zoster Recombinat (Shingrix) 09/26/2017    HPI Gerald Hall is 86 y.o. male who presents today with repeat thyroid function tests.  He was seen in consultation last year due to abnormal thyroid function tests consistent with transient thyrotoxicosis related to thyroiditis.  ZJI:RCVELF, Gerald Snare, MD.  History is obtained directly from the patient as well as from chart review.  Starting from March 2021, he was observed to have suppressed TSH.  In July and October his free T4 was also elevated to the highest level of 1.81, associated with fluctuating body weight, palpitations on the background of atrial fibrillation.  He was given a brief course of prednisone and methimazole.  He has not taken methimazole since last visit, and finish his course of prednisone.  His previsit thyroid function tests are consistent with euthyroid state.  He still remains on amiodarone half dose (100 mg daily) from his cardiologist. He does not have a new complaint today.  His thyroid uptake and scan on September 09, 2020 revealed low uptake of 1.3% in 24 hours.  He remains active smoker.  He does not have acute complaints today.  Review of Systems Limited as above.  Objective:    Vitals with BMI 11/27/2020  11/12/2020 09/25/2020  Height 5\' 8"  - 5\' 8"   Weight 193 lbs 192 lbs 3 oz 191 lbs  BMI 81.01 - 75.10  Systolic 258 527 782  Diastolic 78 78 66  Pulse 68 72 80    BP 138/78   Pulse 68   Ht 5\' 8"  (1.727 m)   Wt 193 lb (87.5 kg)   BMI 29.35 kg/m   Wt Readings from Last 3 Encounters:  11/27/20 193 lb (87.5 kg)  11/12/20 192 lb 3.2 oz (87.2 kg)  09/25/20 191 lb (86.6 kg)    Physical Exam  Constitutional:  Body mass index is 29.35 kg/m.,  not in acute distress, normal state of mind, + relates with a walker. Eyes: PERRLA, EOMI, no exophthalmos   CMP ( most recent) CMP     Component Value Date/Time   NA 137 11/24/2020 1519   K 3.7 11/24/2020 1519   CL 99 11/24/2020 1519   CO2 27 11/24/2020 1519   GLUCOSE 125 (H) 11/24/2020 1519   GLUCOSE 104 (H) 02/24/2019 0548   BUN 21 11/24/2020 1519   CREATININE 1.52 (H) 11/24/2020 1519   CREATININE 1.14 09/06/2014 1259   CALCIUM 9.4 11/24/2020 1519   PROT 6.8 01/23/2020 1455   ALBUMIN 4.0 01/23/2020 1455   AST 13 01/23/2020 1455   ALT 12 01/23/2020 1455   ALKPHOS 97 01/23/2020 1455   BILITOT 0.5 01/23/2020 1455   GFRNONAA 42 (L) 11/24/2020 1519   GFRAA 48 (L) 11/24/2020 1519     Diabetic Labs (most recent): Lab Results  Component Value Date   HGBA1C 5.6 08/18/2015     Lipid Panel ( most recent) Lipid Panel     Component Value Date/Time   CHOL 183 08/05/2020 0932   TRIG 113 08/05/2020 0932   HDL 40 08/05/2020 0932   CHOLHDL 4.6 08/05/2020 0932   CHOLHDL 3.7 09/06/2014 1259   VLDL 29 09/06/2014 1259   LDLCALC 122 (H) 08/05/2020 0932   LABVLDL 21 08/05/2020 0932      Lab Results  Component Value Date   TSH 0.652 11/24/2020   TSH 0.379 (L) 08/05/2020   TSH 0.658 05/21/2020   TSH 0.376 (L) 01/23/2020   TSH 1.047 02/23/2019   TSH 0.752 08/06/2015   FREET4 1.60 11/24/2020   FREET4 1.79 (H) 08/05/2020   FREET4 1.81 (H) 05/21/2020        Thyroid uptake and scan from September 09, 2020  FINDINGS: Very low uptake  within thyroid gland. 4 hour I-123 uptake = 1.3% (normal 5-20%),  24 hour I-123 uptake = 1.3% (normal 10-30%)  IMPRESSION: Very low uptake within the thyroid gland. Recommend clinical correlation for subacute thyroiditis.   Assessment & Plan:   1. Subacute thyroiditis -I reviewed his recent thyroid function tests which are consistent with euthyroid state.  he has  Had subacute thyroiditis most likely related to the background history of amiodarone treatment.  His presentation was consistent with transient thyrotoxicosis related to his subacute thyroiditis.  He has responded to the brief course of prednisone intervention.     In light of his significantly low uptake of 1.3% in 24 hours on September 09, 2020, he did not need ablative antithyroid intervention.   He will have repeat thyroid function test and office visit in 6 months.   -I discussed the fact that amiodarone can still be utilized as his cardiologist sees fit and will manage the thyroid dysfunction on the side as necessary. Smoking puts this patient at higher risk of health complications including COPD. The patient was counseled on the dangers of tobacco use, and was advised to quit.  Reviewed strategies to maximize success, including removing cigarettes and smoking materials from environment.  Evidently, he is not ready to quit smoking.  - I did not initiate any new prescriptions today. - he is advised to maintain close follow up with Gerald Drown, MD for primary care needs.    - Time spent on this patient care encounter:  20 minutes of which 50% was spent in  counseling and the rest reviewing  his current and  previous labs /  studies and medications  doses and developing a plan for long term care. Gerald Hall  participated in the discussions, expressed understanding, and voiced agreement with the above plans.  All questions were answered to his satisfaction. he is encouraged to contact clinic should he have any questions  or concerns prior to his return visit.   Follow up plan: Return in about 6 months (around 05/27/2021) for F/U with Pre-visit Labs.   Glade Lloyd, MD [facility] [address] [place], [place] [address] Phone: [phone]  Fax: [phone]     11/27/2020, 3:23 PM  This note was partially dictated with voice recognition software. Similar sounding words can be transcribed inadequately or may not  be corrected upon review.

## 2020-11-29 ENCOUNTER — Encounter: Payer: Self-pay | Admitting: Family Medicine

## 2020-12-02 DIAGNOSIS — H02401 Unspecified ptosis of right eyelid: Secondary | ICD-10-CM | POA: Diagnosis not present

## 2020-12-17 ENCOUNTER — Other Ambulatory Visit: Payer: Self-pay

## 2020-12-17 ENCOUNTER — Encounter: Payer: Self-pay | Admitting: Cardiology

## 2020-12-17 ENCOUNTER — Ambulatory Visit: Payer: Medicare Other | Admitting: Cardiology

## 2020-12-17 VITALS — BP 138/80 | HR 66 | Ht 68.0 in | Wt 193.0 lb

## 2020-12-17 DIAGNOSIS — I471 Supraventricular tachycardia: Secondary | ICD-10-CM

## 2020-12-17 DIAGNOSIS — Z8679 Personal history of other diseases of the circulatory system: Secondary | ICD-10-CM | POA: Diagnosis not present

## 2020-12-17 MED ORDER — AMIODARONE HCL 200 MG PO TABS: 200.0000 mg | ORAL_TABLET | Freq: Every day | ORAL | 3 refills | Status: DC

## 2020-12-17 NOTE — Progress Notes (Signed)
Cardiology Office Note  Date: 12/17/2020   ID: [place], Pulse Dec 16, 1934, MRN [id number]  PCP:  Kathyrn Drown, MD  Cardiologist:  Rozann Lesches, MD Electrophysiologist:  None   Chief Complaint  Patient presents with  . Cardiac follow-up    History of Present Illness: Gerald Hall is an 86 y.o. male last seen in October 2021.  He presents for a routine visit.  States that since amiodarone has been cut back from 200 mg daily to 100 mg daily, he has had more breakthrough episodes of tachycardia.  He is following with Dr. Dorris Fetch thyroiditis.  Recently noted to be euthyroid.  We reviewed his medications and discussed trial of increasing amiodarone to 200 mg daily with follow-up of thyroid status.  Other antiarrhythmic options are more limited in light of his cardiomyopathy.  His last echocardiogram was in 2020 at which point LVEF was in the range of 45 to 50%.  Past Medical History:  Diagnosis Date  . Arthritis   . Balance problem    Uses a walker  . Blood in urine    Sees urology yearly  . Deafness in right ear age 5  . Hyperlipidemia   . Hypertension   . PSVT (paroxysmal supraventricular tachycardia) ([place])   . Temporal arteritis ([place]) 2016  . Urinary incontinence     Past Surgical History:  Procedure Laterality Date  . BACK SURGERY  2010  . CATARACT EXTRACTION Bilateral 07/30/14, 08/13/2014  . CERVICAL SPINE SURGERY  09/01/15   C 4-5 , [place]  . COLONOSCOPY  12/19/2007   RMR: 1. External hemorrohids, otherwise normal rectum.2. Normal colon. 3. Normal terminal ileum.  . COLONOSCOPY N/A 12/13/2014   Procedure: COLONOSCOPY;  Surgeon: Daneil Dolin, MD;  [facility];  Service: Endoscopy;  Laterality: N/A;  11:15 Pt Request Time  . KNEE SURGERY Left    around 2000, arthroscopic  . TOTAL HIP ARTHROPLASTY Right 02/23/2017   Procedure: TOTAL HIP ARTHROPLASTY ANTERIOR APPROACH;  Surgeon: Hessie Knows, MD;  Location: [facility] ORS;  Service:  Orthopedics;  Laterality: Right;    Current Outpatient Medications  Medication Sig Dispense Refill  . acetaminophen (TYLENOL) 325 MG tablet Take by mouth.    Marland Kitchen amiodarone (PACERONE) 200 MG tablet Take 1 tablet (200 mg total) by mouth daily. 90 tablet 3  . aspirin EC 325 MG EC tablet Take 1 tablet (325 mg total) by mouth daily. 30 tablet 0  . carvedilol (COREG) 3.125 MG tablet TAKE (1) TABLET BY MOUTH TWICE A DAY WITH MEALS (BREAKFAST AND SUPPER) 180 tablet 1  . cholecalciferol (VITAMIN D) 400 units TABS tablet Take 400 Units by mouth daily.     Marland Kitchen Dextromethorphan-guaiFENesin (MUCINEX DM) 30-600 MG TB12 Take 1 tablet by mouth daily.     . finasteride (PROSCAR) 5 MG tablet TAKE 1 TABLET BY MOUTH ONCE DAILY. 90 tablet 1  . fluticasone (FLONASE) 50 MCG/ACT nasal spray 1 spray into each nostril nightly.    . hydrochlorothiazide (HYDRODIURIL) 25 MG tablet TAKE 1 TABLET BY MOUTH ONCE DAILY. 90 tablet 0  . mirabegron ER (MYRBETRIQ) 25 MG TB24 tablet daily.    Marland Kitchen neomycin-polymyxin-dexameth (MAXITROL) 0.1 % OINT     . polyvinyl alcohol (LIQUIFILM TEARS) 1.4 % ophthalmic solution Administer 1 drop to both eyes Two (2) times a day.    . potassium chloride (KLOR-CON) 10 MEQ tablet TAKE 1 TABLET BY MOUTH TWICE DAILY 180 tablet 1  . rOPINIRole (REQUIP) 2  MG tablet Take 2 tablets (4 mg total) by mouth at bedtime. TAKE 1 TABLET BY MOUTH AT LATE AFTERNOON AND TAKE ONE TABLET AT BEDTIME 180 tablet 1  . senna-docusate (SENOKOT-S) 8.6-50 MG tablet Take by mouth.    . SYMBICORT 160-4.5 MCG/ACT inhaler INHALE 2 PUFFS BY MOUTH TWICE DAILY 10.2 g 5  . tamsulosin (FLOMAX) 0.4 MG CAPS capsule TAKE (1) CAPSULE BY MOUTH TWICE DAILY. 180 capsule 0  . traMADol (ULTRAM) 50 MG tablet TAKE (1) TABLET BY MOUTH EVERY TWELVE HOURS AS NEEDED. 60 tablet 4  . vitamin C (ASCORBIC ACID) 500 MG tablet Take 500 mg by mouth daily.    . Zinc Acetate, Oral, (ZINC ACETATE PO) Take 1 tablet by mouth daily.     No current  facility-administered medications for this visit.   Allergies:  Penicillins   ROS: No chest pain.  Physical Exam: VS:  BP 138/80   Pulse 66   Ht 5\' 8"  (1.727 m)   Wt 193 lb (87.5 kg)   SpO2 96%   BMI 29.35 kg/m , BMI Body mass index is 29.35 kg/m.  Wt Readings from Last 3 Encounters:  12/17/20 193 lb (87.5 kg)  11/27/20 193 lb (87.5 kg)  11/12/20 192 lb 3.2 oz (87.2 kg)    General: Patient appears comfortable at rest. HEENT: Conjunctiva and lids normal, wearing a mask. Neck: Supple, no elevated JVP or carotid bruits, no thyromegaly. Lungs: Clear to auscultation, nonlabored breathing at rest. Cardiac: Regular rate and rhythm, no S3 or significant systolic murmur. Extremities: No pitting edema.  ECG:  An ECG dated 08/12/2020 was personally reviewed today and demonstrated:  Sinus rhythm with prolonged PR interval, low voltage, and IVCD.  Recent Labwork: 01/23/2020: ALT 12; AST 13; Hemoglobin 17.1; Platelets 218 11/24/2020: BUN 21; Creatinine, Ser 1.52; Potassium 3.7; Sodium 137; TSH 0.652     Component Value Date/Time   CHOL 183 08/05/2020 0932   TRIG 113 08/05/2020 0932   HDL 40 08/05/2020 0932   CHOLHDL 4.6 08/05/2020 0932   CHOLHDL 3.7 09/06/2014 1259   VLDL 29 09/06/2014 1259   LDLCALC 122 (H) 08/05/2020 0932    Other Studies Reviewed Today:  Echocardiogram 02/24/2019: 1. The left ventricle has mildly reduced systolic function, with an ejection fraction of 45-50%. The cavity size was normal. 2. The right ventricle has normal systolc function. The cavity was mildly enlarged. There is no increase in right ventricular wall thickness. 3. The mitral valve is abnormal. Mild thickening of the mitral valve leaflet. 4. The tricuspid valve was grossly normal. 5. The aortic valve is tricuspid Mild thickening of the aortic valve Mild calcification of the aortic valve. 6. The aortic root is normal in size and structure. 7. The interatrial septum was not  assessed.  14-day event recorder 05/17/2019: 14-day event recorder reviewed. Predominant rhythm is sinus with heart rate ranging from 50 bpm up to 107 bpm, average heart rate 68 bpm. There were 7 episodes of NSVT noted, 4-5 beats at most. Brief episode of ventricular bigeminy noted. No sustained ventricular arrhythmias. Several episodes of SVT were also noted, largely regular narrow complex tachycardia which could represent either atrial flutter or possibly a reentrant mechanism or atrial tachycardia. There were no pauses.  Assessment and Plan:  1.  PSVT, reporting increasing sense of palpitations and tachycardia since amiodarone was reduced to 100 mg daily.  In light of recent euthyroid status we will try to once again increase back to 200 mg daily, follow-up TSH in the  next 6 to 8 weeks.  He is already following with Dr. Dorris Fetch.  Continue Coreg as well.  2.  History of cardiomyopathy, LVEF 45 to 50% as of 2020.  We will obtain a follow-up echocardiogram.  Medication Adjustments/Labs and Tests Ordered: Current medicines are reviewed at length with the patient today.  Concerns regarding medicines are outlined above.   Tests Ordered: Orders Placed This Encounter  Procedures  . TSH    Medication Changes: Meds ordered this encounter  Medications  . amiodarone (PACERONE) 200 MG tablet    Sig: Take 1 tablet (200 mg total) by mouth daily.    Dispense:  90 tablet    Refill:  3    Disposition:  Follow up 6 months in the [place] office.  Signed, Satira Sark, MD, [facility] 12/17/2020 3:41 PM    [facility] [address] [place], [place] [address] Phone: [phone]; Fax: [phone]

## 2020-12-17 NOTE — Patient Instructions (Addendum)
Medication Instructions:  Your physician recommends that you continue on your current medications as directed. Please refer to the Current Medication list given to you today. *If you need a refill on your cardiac medications before your next appointment, please call your pharmacy*  Increase Amiodarone to 200 mg Daily   Lab Work: Your physician recommends that you return for lab work in: 6-8 weeks (02/04/21)  If you have labs (blood work) drawn today and your tests are completely normal, you will receive your results only by: Marland Kitchen MyChart Message (if you have MyChart) OR . A paper copy in the mail If you have any lab test that is abnormal or we need to change your treatment, we will call you to review the results.   Testing/Procedures: NONE    Follow-Up: At [facility], you and your health needs are our priority.  As part of our continuing mission to provide you with exceptional heart care, we have created designated Provider Care Teams.  These Care Teams include your primary Cardiologist (physician) and Advanced Practice Providers (APPs -  Physician Assistants and Nurse Practitioners) who all work together to provide you with the care you need, when you need it.  We recommend signing up for the patient portal called "MyChart".  Sign up information is provided on this After Visit Summary.  MyChart is used to connect with patients for Virtual Visits (Telemedicine).  Patients are able to view lab/test results, encounter notes, upcoming appointments, etc.  Non-urgent messages can be sent to your provider as well.   To learn more about what you can do with MyChart, go to [email or web address].    Your next appointment:   6 month(s)  The format for your next appointment:   In Person  Provider:   Rozann Lesches, MD   Other Instructions Thank you for choosing [facility]!

## 2021-01-22 ENCOUNTER — Other Ambulatory Visit: Payer: Self-pay | Admitting: Family Medicine

## 2021-02-09 NOTE — Progress Notes (Signed)
Subjective:   Gerald Hall is a 86 y.o. male who presents for an Initial Medicare Annual Wellness Visit.   I connected with Gerald Hall today by telephone and verified that I am speaking with the correct person using two identifiers. Location patient: home Location provider: work Persons participating in the virtual visit: patient, provider.   I discussed the limitations, risks, security and privacy concerns of performing an evaluation and management service by telephone and the availability of in person appointments. I also discussed with the patient that there may be a patient responsible charge related to this service. The patient expressed understanding and verbally consented to this telephonic visit.    Interactive audio and video telecommunications were attempted between this provider and patient, however failed, due to patient having technical difficulties OR patient did not have access to video capability.  We continued and completed visit with audio only.       Larene Beach Mackenize Delgadillo,LPN    Review of Systems    N/A  Cardiac Risk Factors include: advanced age (>41men, >72 women);male gender     Objective:    Today's Vitals   There is no height or weight on file to calculate BMI.  Advanced Directives 02/10/2021 02/24/2019 02/23/2019 08/31/2017 08/24/2017 07/28/2017 07/26/2017  Does Patient Have a Medical Advance Directive? Yes Yes Yes Yes Yes Yes Yes  Type of Paramedic of [place];Living will Healthcare Power of [place] of [place];Living will [place];Living will [place];Living will [place];Living will  Does patient want to make changes to medical advance directive? No - Patient declined No - Patient declined - - - - -  Copy of [place] in Chart? No - copy requested - - No - copy requested No - copy requested No - copy requested No  - copy requested  Would patient like information on creating a medical advance directive? - - - No - Patient declined No - Patient declined No - Patient declined No - Patient declined    Current Medications (verified) Outpatient Encounter Medications as of 02/10/2021  Medication Sig  . acetaminophen (TYLENOL) 325 MG tablet Take by mouth.  Marland Kitchen amiodarone (PACERONE) 200 MG tablet Take 1 tablet (200 mg total) by mouth daily.  Marland Kitchen aspirin EC 325 MG EC tablet Take 1 tablet (325 mg total) by mouth daily.  . carvedilol (COREG) 3.125 MG tablet TAKE (1) TABLET BY MOUTH TWICE A DAY WITH MEALS (BREAKFAST AND SUPPER)  . cholecalciferol (VITAMIN D) 400 units TABS tablet Take 400 Units by mouth daily.   Marland Kitchen Dextromethorphan-guaiFENesin (MUCINEX DM) 30-600 MG TB12 Take 1 tablet by mouth daily.   . finasteride (PROSCAR) 5 MG tablet TAKE 1 TABLET BY MOUTH ONCE DAILY.  . fluticasone (FLONASE) 50 MCG/ACT nasal spray 1 spray into each nostril nightly.  . hydrochlorothiazide (HYDRODIURIL) 25 MG tablet TAKE 1 TABLET BY MOUTH ONCE DAILY.  . mirabegron ER (MYRBETRIQ) 25 MG TB24 tablet daily.  . polyvinyl alcohol (LIQUIFILM TEARS) 1.4 % ophthalmic solution Administer 1 drop to both eyes Two (2) times a day.  . potassium chloride (KLOR-CON) 10 MEQ tablet TAKE 1 TABLET BY MOUTH TWICE DAILY  . rOPINIRole (REQUIP) 2 MG tablet Take 2 tablets (4 mg total) by mouth at bedtime. TAKE 1 TABLET BY MOUTH AT LATE AFTERNOON AND TAKE ONE TABLET AT BEDTIME  . senna-docusate (SENOKOT-S) 8.6-50 MG tablet Take by mouth.  . SYMBICORT 160-4.5 MCG/ACT inhaler  INHALE 2 PUFFS BY MOUTH TWICE DAILY  . tamsulosin (FLOMAX) 0.4 MG CAPS capsule TAKE (1) CAPSULE BY MOUTH TWICE DAILY.  . traMADol (ULTRAM) 50 MG tablet TAKE (1) TABLET BY MOUTH EVERY TWELVE HOURS AS NEEDED.  Marland Kitchen vitamin C (ASCORBIC ACID) 500 MG tablet Take 500 mg by mouth daily.  . Zinc Acetate, Oral, (ZINC ACETATE PO) Take 1 tablet by mouth daily.  Marland Kitchen neomycin-polymyxin-dexameth (MAXITROL)  0.1 % OINT  (Patient not taking: Reported on 02/10/2021)  . [DISCONTINUED] potassium chloride (KLOR-CON) 10 MEQ tablet TAKE 1 TABLET BY MOUTH TWICE DAILY   No facility-administered encounter medications on file as of 02/10/2021.    Allergies (verified) Penicillins   History: Past Medical History:  Diagnosis Date  . Arthritis   . Balance problem    Uses a walker  . Blood in urine    Sees urology yearly  . Deafness in right ear age 27  . Hyperlipidemia   . Hypertension   . PSVT (paroxysmal supraventricular tachycardia) ([place])   . Temporal arteritis ([place]) 2016  . Urinary incontinence    Past Surgical History:  Procedure Laterality Date  . BACK SURGERY  2010  . BELPHAROPTOSIS REPAIR    . CATARACT EXTRACTION Bilateral 07/30/14, 08/13/2014  . CERVICAL SPINE SURGERY  09/01/15   C 4-5 , [place]  . COLONOSCOPY  12/19/2007   RMR: 1. External hemorrohids, otherwise normal rectum.2. Normal colon. 3. Normal terminal ileum.  . COLONOSCOPY N/A 12/13/2014   Procedure: COLONOSCOPY;  Surgeon: Daneil Dolin, MD;  Location: [facility];  Service: Endoscopy;  Laterality: N/A;  11:15 Pt Request Time  . KNEE SURGERY Left    around 2000, arthroscopic  . TOTAL HIP ARTHROPLASTY Right 02/23/2017   Procedure: TOTAL HIP ARTHROPLASTY ANTERIOR APPROACH;  Surgeon: Hessie Knows, MD;  Location: [facility] ORS;  Service: Orthopedics;  Laterality: Right;   Family History  Problem Relation Age of Onset  . Colon cancer Brother   . Diabetes Brother   . Diabetes Brother    Social History   Socioeconomic History  . Marital status: Married    Spouse name: Pamala Hurry  . Number of children: 2  . Years of education: 10  . Highest education level: Not on file  Occupational History    Comment: retired Software engineer  Tobacco Use  . Smoking status: Current Every Day Smoker    Packs/day: 0.50    Types: Cigarettes  . Smokeless tobacco: Never Used  . Tobacco comment: 08/18/15 1/2 - 3/4 pack cigarettes daily   Vaping Use  . Vaping Use: Never used  Substance and Sexual Activity  . Alcohol use: Yes    Alcohol/week: 0.0 standard drinks    Comment: mixed drink 1-2 times per month, social  . Drug use: No  . Sexual activity: Not on file  Other Topics Concern  . Not on file  Social History Narrative   Lives at home with wife, who has PD   Caffeine use- coffee 8-10 cups daily   Social Determinants of Health   Financial Resource Strain: Low Risk   . Difficulty of Paying Living Expenses: Not hard at all  Food Insecurity: No Food Insecurity  . Worried About Charity fundraiser in the Last Year: Never true  . Ran Out of Food in the Last Year: Never true  Transportation Needs: No Transportation Needs  . Lack of Transportation (Medical): No  . Lack of Transportation (Non-Medical): No  Physical Activity: Inactive  . Days of Exercise  per Week: 0 days  . Minutes of Exercise per Session: 0 min  Stress: No Stress Concern Present  . Feeling of Stress : Not at all  Social Connections: Moderately Isolated  . Frequency of Communication with Friends and Family: More than three times a week  . Frequency of Social Gatherings with Friends and Family: Once a week  . Attends Religious Services: More than 4 times per year  . Active Member of Clubs or Organizations: No  . Attends Archivist Meetings: Never  . Marital Status: Widowed    Tobacco Counseling Ready to quit: Not Answered Counseling given: Not Answered Comment: 08/18/15 1/2 - 3/4 pack cigarettes daily   Clinical Intake:  Pre-visit preparation completed: Yes  Pain : No/denies pain     Nutritional Risks: Nausea/ vomitting/ diarrhea (diarrhea) Diabetes: No  How often do you need to have someone help you when you read instructions, pamphlets, or other written materials from your doctor or pharmacy?: 1 - Never  Diabetic?No   Interpreter Needed?: No  Information entered by :: [place] of Daily Living In your  present state of health, do you have any difficulty performing the following activities: 02/10/2021  Hearing? Y  Comment deafness in right ear, has hearing aid for left ear  Vision? Y  Difficulty concentrating or making decisions? Y  Comment has issues with forgetfullfness especially at night  Walking or climbing stairs? Y  Dressing or bathing? N  Doing errands, shopping? Y  Preparing Food and eating ? N  Using the Toilet? N  In the past six months, have you accidently leaked urine? Y  Comment Wears depends underwear  Do you have problems with loss of bowel control? Y  Comment once after taking a laxative  Managing your Medications? N  Managing your Finances? N  Housekeeping or managing your Housekeeping? Y  Some recent data might be hidden    Patient Care Team: Kathyrn Drown, MD as PCP - General (Family Medicine) Satira Sark, MD as PCP - Cardiology (Cardiology) Kathyrn Drown, MD (Family Medicine) Gala Romney Cristopher Estimable, MD as Consulting Physician (Gastroenterology) Minna Merritts, MD as Consulting Physician (Cardiology)  Indicate any recent Medical Services you may have received from other than [facility] providers in the past year (date may be approximate).     Assessment:   This is a routine wellness examination for [facility].  Hearing/Vision screen  Hearing Screening   125Hz  250Hz  500Hz  1000Hz  2000Hz  3000Hz  4000Hz  6000Hz  8000Hz   Right ear:           Left ear:           Vision Screening Comments: Patient gets eye examined once per year. Wears glasses   Dietary issues and exercise activities discussed: Current Exercise Habits: The patient does not participate in regular exercise at present, Exercise limited by: orthopedic condition(s);Other - see comments (balance issues)  Goals    . Prevent falls     Continue to use walker to help prevent falls       Depression Screen PHQ 2/9 Scores 02/10/2021 11/12/2020 05/12/2020 05/08/2018 09/03/2014  PHQ - 2 Score 0 0 0 0 0     Fall Risk Fall Risk  02/10/2021 11/12/2020 08/12/2020 01/23/2020 08/22/2019  Falls in the past year? 1 1 1  0 0  Comment - - - - -  Number falls in past yr: 0 1 0 - -  Injury with Fall? 0 0 0 0 -  Risk for fall due  to : Impaired balance/gait;Impaired mobility;History of fall(s) - History of fall(s) History of fall(s) Impaired balance/gait;Impaired mobility  Risk for fall due to: Comment - - - - -  Follow up Falls evaluation completed;Falls prevention discussed Falls evaluation completed Falls evaluation completed Falls evaluation completed Falls evaluation completed    FALL RISK PREVENTION PERTAINING TO THE HOME:  Any stairs in or around the home? No  If so, are there any without handrails? No  Home free of loose throw rugs in walkways, pet beds, electrical cords, etc? Yes  Adequate lighting in your home to reduce risk of falls? Yes   ASSISTIVE DEVICES UTILIZED TO PREVENT FALLS:  Life alert? No  Use of a cane, walker or w/c? Yes  Grab bars in the bathroom? Yes  Shower chair or bench in shower? Yes  Elevated toilet seat or a handicapped toilet? No     Cognitive Function:   Montreal Cognitive Assessment  01/26/2020  Visuospatial/ Executive (0/5) 5  Naming (0/3) 3  Attention: Read list of digits (0/2) 2  Attention: Read list of letters (0/1) 1  Attention: Serial 7 subtraction starting at 100 (0/3) 3  Language: Repeat phrase (0/2) 2  Language : Fluency (0/1) 1  Abstraction (0/2) 2  Delayed Recall (0/5) 2  Orientation (0/6) 6  Total 27  Adjusted Score (based on education) 27   6CIT Screen 02/10/2021  What Year? 0 points  What month? 0 points  What time? 0 points  Count back from 20 0 points  Months in reverse 0 points  Repeat phrase 0 points  Total Score 0    Immunizations Immunization History  Administered Date(s) Administered  . Fluad Quad(high Dose 65+) 08/12/2020  . Influenza,inj,Quad PF,6+ Mos 07/20/2016, 08/07/2018, 08/22/2019  . Influenza-Unspecified 09/03/2013   . Moderna Sars-Covid-2 Vaccination 10/30/2019, 11/27/2019, 08/28/2020  . Pneumococcal Conjugate-13 09/03/2014  . Pneumococcal Polysaccharide-23 07/20/2016  . Zoster Recombinat (Shingrix) 09/26/2017, 02/23/2018    TDAP status: Due, Education has been provided regarding the importance of this vaccine. Advised may receive this vaccine at local pharmacy or Health Dept. Aware to provide a copy of the vaccination record if obtained from local pharmacy or Health Dept. Verbalized acceptance and understanding.  Flu Vaccine status: Up to date  Pneumococcal vaccine status: Up to date  Covid-19 vaccine status: Completed vaccines  Qualifies for Shingles Vaccine? Yes   Zostavax completed No   Shingrix Completed?: Yes  Screening Tests Health Maintenance  Topic Date Due  . TETANUS/TDAP  Never done  . COVID-19 Vaccine (4 - Booster for Moderna series) 02/25/2021  . INFLUENZA VACCINE  05/25/2021  . PNA vac Low Risk Adult  Completed  . HPV VACCINES  Aged Out    Health Maintenance  Health Maintenance Due  Topic Date Due  . TETANUS/TDAP  Never done    Colorectal cancer screening: No longer required.   Lung Cancer Screening: (Low Dose CT Chest recommended if Age 35-80 years, 30 pack-year currently smoking OR have quit w/in 15years.) does not qualify.   Lung Cancer Screening Referral: N/A   Additional Screening:  Hepatitis C Screening: does not qualify;   Vision Screening: Recommended annual ophthalmology exams for early detection of glaucoma and other disorders of the eye. Is the patient up to date with their annual eye exam?  Yes  Who is the provider or what is the name of the office in which the patient attends annual eye exams? Dr. Glade Stanford If pt is not established with a provider, would they like to  be referred to a provider to establish care? No .   Dental Screening: Recommended annual dental exams for proper oral hygiene  Community Resource Referral / Chronic Care Management: CRR  required this visit?  No   CCM required this visit?  No      Plan:     I have personally reviewed and noted the following in the patient's chart:   . Medical and social history . Use of alcohol, tobacco or illicit drugs  . Current medications and supplements . Functional ability and status . Nutritional status . Physical activity . Advanced directives . List of other physicians . Hospitalizations, surgeries, and ER visits in previous 12 months . Vitals . Screenings to include cognitive, depression, and falls . Referrals and appointments  In addition, I have reviewed and discussed with patient certain preventive protocols, quality metrics, and best practice recommendations. A written personalized care plan for preventive services as well as general preventive health recommendations were provided to patient.     Ofilia Neas, LPN   6/70/1410   Nurse Notes: None

## 2021-02-10 ENCOUNTER — Ambulatory Visit: Payer: Medicare Other | Admitting: Family Medicine

## 2021-02-10 ENCOUNTER — Other Ambulatory Visit: Payer: Self-pay

## 2021-02-10 ENCOUNTER — Ambulatory Visit (INDEPENDENT_AMBULATORY_CARE_PROVIDER_SITE_OTHER): Payer: Medicare Other

## 2021-02-10 DIAGNOSIS — Z0000 Encounter for general adult medical examination without abnormal findings: Secondary | ICD-10-CM

## 2021-02-10 NOTE — Patient Instructions (Signed)
Mr. Gerald Hall , Thank you for taking time to come for your Medicare Wellness Visit. I appreciate your ongoing commitment to your health goals. Please review the following plan we discussed and let me know if I can assist you in the future.   Screening recommendations/referrals: Colonoscopy: No longer required  Recommended yearly ophthalmology/optometry visit for glaucoma screening and checkup Recommended yearly dental visit for hygiene and checkup  Vaccinations: Influenza vaccine: Up to date, next due fall 2022  Pneumococcal vaccine: Completed series  Tdap vaccine: Currently due, you may await and injury to receive as it will be covered by medicare at that time Shingles vaccine: Completed series     Advanced directives: Please bring in a copy of your advanced medical directives so that we may scan it into your chart.  Conditions/risks identified: None   Next appointment: 02/23/2021 @ 1:10 PM with Dr. Nicki Reaper   Preventive Care 65 Years and Older, Male Preventive care refers to lifestyle choices and visits with your health care provider that can promote health and wellness. What does preventive care include?  A yearly physical exam. This is also called an annual well check.  Dental exams once or twice a year.  Routine eye exams. Ask your health care provider how often you should have your eyes checked.  Personal lifestyle choices, including:  Daily care of your teeth and gums.  Regular physical activity.  Eating a healthy diet.  Avoiding tobacco and drug use.  Limiting alcohol use.  Practicing safe sex.  Taking low doses of aspirin every day.  Taking vitamin and mineral supplements as recommended by your health care provider. What happens during an annual well check? The services and screenings done by your health care provider during your annual well check will depend on your age, overall health, lifestyle risk factors, and family history of disease. Counseling  Your health  care provider may ask you questions about your:  Alcohol use.  Tobacco use.  Drug use.  Emotional well-being.  Home and relationship well-being.  Sexual activity.  Eating habits.  History of falls.  Memory and ability to understand (cognition).  Work and work Statistician. Screening  You may have the following tests or measurements:  Height, weight, and BMI.  Blood pressure.  Lipid and cholesterol levels. These may be checked every 5 years, or more frequently if you are over 22 years old.  Skin check.  Lung cancer screening. You may have this screening every year starting at age 80 if you have a 30-pack-year history of smoking and currently smoke or have quit within the past 15 years.  Fecal occult blood test (FOBT) of the stool. You may have this test every year starting at age 3.  Flexible sigmoidoscopy or colonoscopy. You may have a sigmoidoscopy every 5 years or a colonoscopy every 10 years starting at age 57.  Prostate cancer screening. Recommendations will vary depending on your family history and other risks.  Hepatitis C blood test.  Hepatitis B blood test.  Sexually transmitted disease (STD) testing.  Diabetes screening. This is done by checking your blood sugar (glucose) after you have not eaten for a while (fasting). You may have this done every 1-3 years.  Abdominal aortic aneurysm (AAA) screening. You may need this if you are a current or former smoker.  Osteoporosis. You may be screened starting at age 67 if you are at high risk. Talk with your health care provider about your test results, treatment options, and if necessary, the need for  more tests. Vaccines  Your health care provider may recommend certain vaccines, such as:  Influenza vaccine. This is recommended every year.  Tetanus, diphtheria, and acellular pertussis (Tdap, Td) vaccine. You may need a Td booster every 10 years.  Zoster vaccine. You may need this after age  77.  Pneumococcal 13-valent conjugate (PCV13) vaccine. One dose is recommended after age 60.  Pneumococcal polysaccharide (PPSV23) vaccine. One dose is recommended after age 45. Talk to your health care provider about which screenings and vaccines you need and how often you need them. This information is not intended to replace advice given to you by your health care provider. Make sure you discuss any questions you have with your health care provider. Document Released: 11/07/2015 Document Revised: 06/30/2016 Document Reviewed: 08/12/2015 Elsevier Interactive Patient Education  2017 [employer] Prevention in the Home Falls can cause injuries. They can happen to people of all ages. There are many things you can do to make your home safe and to help prevent falls. What can I do on the outside of my home?  Regularly fix the edges of walkways and driveways and fix any cracks.  Remove anything that might make you trip as you walk through a door, such as a raised step or threshold.  Trim any bushes or trees on the path to your home.  Use bright outdoor lighting.  Clear any walking paths of anything that might make someone trip, such as rocks or tools.  Regularly check to see if handrails are loose or broken. Make sure that both sides of any steps have handrails.  Any raised decks and porches should have guardrails on the edges.  Have any leaves, snow, or ice cleared regularly.  Use sand or salt on walking paths during winter.  Clean up any spills in your garage right away. This includes oil or grease spills. What can I do in the bathroom?  Use night lights.  Install grab bars by the toilet and in the tub and shower. Do not use towel bars as grab bars.  Use non-skid mats or decals in the tub or shower.  If you need to sit down in the shower, use a plastic, non-slip stool.  Keep the floor dry. Clean up any water that spills on the floor as soon as it happens.  Remove  soap buildup in the tub or shower regularly.  Attach bath mats securely with double-sided non-slip rug tape.  Do not have throw rugs and other things on the floor that can make you trip. What can I do in the bedroom?  Use night lights.  Make sure that you have a light by your bed that is easy to reach.  Do not use any sheets or blankets that are too big for your bed. They should not hang down onto the floor.  Have a firm chair that has side arms. You can use this for support while you get dressed.  Do not have throw rugs and other things on the floor that can make you trip. What can I do in the kitchen?  Clean up any spills right away.  Avoid walking on wet floors.  Keep items that you use a lot in easy-to-reach places.  If you need to reach something above you, use a strong step stool that has a grab bar.  Keep electrical cords out of the way.  Do not use floor polish or wax that makes floors slippery. If you must use wax, use non-skid  floor wax.  Do not have throw rugs and other things on the floor that can make you trip. What can I do with my stairs?  Do not leave any items on the stairs.  Make sure that there are handrails on both sides of the stairs and use them. Fix handrails that are broken or loose. Make sure that handrails are as long as the stairways.  Check any carpeting to make sure that it is firmly attached to the stairs. Fix any carpet that is loose or worn.  Avoid having throw rugs at the top or bottom of the stairs. If you do have throw rugs, attach them to the floor with carpet tape.  Make sure that you have a light switch at the top of the stairs and the bottom of the stairs. If you do not have them, ask someone to add them for you. What else can I do to help prevent falls?  Wear shoes that:  Do not have high heels.  Have rubber bottoms.  Are comfortable and fit you well.  Are closed at the toe. Do not wear sandals.  If you use a  stepladder:  Make sure that it is fully opened. Do not climb a closed stepladder.  Make sure that both sides of the stepladder are locked into place.  Ask someone to hold it for you, if possible.  Clearly mark and make sure that you can see:  Any grab bars or handrails.  First and last steps.  Where the edge of each step is.  Use tools that help you move around (mobility aids) if they are needed. These include:  Canes.  Walkers.  Scooters.  Crutches.  Turn on the lights when you go into a dark area. Replace any light bulbs as soon as they burn out.  Set up your furniture so you have a clear path. Avoid moving your furniture around.  If any of your floors are uneven, fix them.  If there are any pets around you, be aware of where they are.  Review your medicines with your doctor. Some medicines can make you feel dizzy. This can increase your chance of falling. Ask your doctor what other things that you can do to help prevent falls. This information is not intended to replace advice given to you by your health care provider. Make sure you discuss any questions you have with your health care provider. Document Released: 08/07/2009 Document Revised: 03/18/2016 Document Reviewed: 11/15/2014 Elsevier Interactive Patient Education  2017 [employer].

## 2021-02-20 ENCOUNTER — Other Ambulatory Visit: Payer: Self-pay | Admitting: "Endocrinology

## 2021-02-20 DIAGNOSIS — E061 Subacute thyroiditis: Secondary | ICD-10-CM | POA: Diagnosis not present

## 2021-02-20 LAB — TSH: TSH: 0.48 (ref 0.41–5.90)

## 2021-02-21 LAB — T4, FREE: Free T4: 1.76 ng/dL (ref 0.82–1.77)

## 2021-02-21 LAB — T3, FREE: T3, Free: 2.2 pg/mL (ref 2.0–4.4)

## 2021-02-21 LAB — TSH: TSH: 0.482 u[IU]/mL (ref 0.450–4.500)

## 2021-02-23 ENCOUNTER — Other Ambulatory Visit: Payer: Self-pay

## 2021-02-23 ENCOUNTER — Encounter: Payer: Self-pay | Admitting: Family Medicine

## 2021-02-23 ENCOUNTER — Ambulatory Visit (INDEPENDENT_AMBULATORY_CARE_PROVIDER_SITE_OTHER): Payer: Medicare Other | Admitting: Family Medicine

## 2021-02-23 ENCOUNTER — Other Ambulatory Visit: Payer: Self-pay | Admitting: "Endocrinology

## 2021-02-23 VITALS — BP 138/86 | HR 64 | Temp 97.5°F | Ht 68.0 in | Wt 193.0 lb

## 2021-02-23 DIAGNOSIS — I429 Cardiomyopathy, unspecified: Secondary | ICD-10-CM

## 2021-02-23 DIAGNOSIS — J432 Centrilobular emphysema: Secondary | ICD-10-CM | POA: Diagnosis not present

## 2021-02-23 DIAGNOSIS — R531 Weakness: Secondary | ICD-10-CM

## 2021-02-23 DIAGNOSIS — I10 Essential (primary) hypertension: Secondary | ICD-10-CM

## 2021-02-23 DIAGNOSIS — Z13220 Encounter for screening for lipoid disorders: Secondary | ICD-10-CM

## 2021-02-23 DIAGNOSIS — N289 Disorder of kidney and ureter, unspecified: Secondary | ICD-10-CM | POA: Diagnosis not present

## 2021-02-23 MED ORDER — TAMSULOSIN HCL 0.4 MG PO CAPS: ORAL_CAPSULE | ORAL | 1 refills | Status: DC

## 2021-02-23 MED ORDER — ROPINIROLE HCL 2 MG PO TABS
4.0000 mg | ORAL_TABLET | Freq: Every day | ORAL | 1 refills | Status: DC
Start: 2021-02-23 — End: 2021-09-09

## 2021-02-23 MED ORDER — FINASTERIDE 5 MG PO TABS: 5.0000 mg | ORAL_TABLET | Freq: Every day | ORAL | 1 refills | Status: DC

## 2021-02-23 MED ORDER — POTASSIUM CHLORIDE ER 10 MEQ PO TBCR: 10.0000 meq | EXTENDED_RELEASE_TABLET | Freq: Two times a day (BID) | ORAL | 1 refills | Status: DC

## 2021-02-23 MED ORDER — HYDROCHLOROTHIAZIDE 25 MG PO TABS: 1.0000 | ORAL_TABLET | Freq: Every day | ORAL | 1 refills | Status: DC

## 2021-02-23 MED ORDER — TRAMADOL HCL 50 MG PO TABS: ORAL_TABLET | ORAL | 5 refills | Status: DC

## 2021-02-23 MED ORDER — BUDESONIDE-FORMOTEROL FUMARATE 160-4.5 MCG/ACT IN AERO: 2.0000 | INHALATION_SPRAY | Freq: Two times a day (BID) | RESPIRATORY_TRACT | 5 refills | Status: DC

## 2021-02-23 MED ORDER — CARVEDILOL 3.125 MG PO TABS: ORAL_TABLET | ORAL | 1 refills | Status: DC

## 2021-02-23 NOTE — Patient Instructions (Signed)
Hi Gerald Hall  It was good to see you today. We will make sure your medications are updated  please keep everything as is. Please do lab work a few days before your follow-up visit in 4 months  Please follow-up sooner if any problems or notify us. Thanks-Dr. Nicki Reaper

## 2021-02-23 NOTE — Progress Notes (Signed)
   Subjective:    Patient ID: Gerald Hall, male    DOB: 10-31-1934, 86 y.o.   MRN: [id number]  HPImed check up. Pt states no concerns today.  Renal insufficiency - Plan: Lipid panel, Comprehensive metabolic panel  Centrilobular emphysema (HCC) - Plan: Lipid panel, Comprehensive metabolic panel  Secondary cardiomyopathy ([place]) - Plan: Lipid panel, Comprehensive metabolic panel  HTN (hypertension), benign - Plan: Lipid panel, Comprehensive metabolic panel  Weakness - Plan: Lipid panel, Comprehensive metabolic panel  Encounter for screening for lipid disorder - Plan: Lipid panel, Comprehensive metabolic panel Patient still smokes unlikely he will quit Has renal insufficiency and underlying heart disease but feels it is doing well currently Takes his medications as directed Has a lot of weakness in his legs when he walks has to use his walker but tries to do the best he can Moods are doing well denies being depressed    Review of Systems  Constitutional: Negative for activity change.  HENT: Negative for congestion and rhinorrhea.   Respiratory: Negative for cough and shortness of breath.   Cardiovascular: Negative for chest pain.  Gastrointestinal: Negative for abdominal pain, diarrhea, nausea and vomiting.  Genitourinary: Negative for dysuria and hematuria.  Neurological: Negative for weakness and headaches.  Psychiatric/Behavioral: Negative for behavioral problems and confusion.       Objective:   Physical Exam Constitutional:      General: He is not in acute distress.    Appearance: He is well-developed.  HENT:     Head: Normocephalic.  Cardiovascular:     Rate and Rhythm: Normal rate and regular rhythm.     Heart sounds: Normal heart sounds. No murmur heard.   Pulmonary:     Effort: Pulmonary effort is normal.     Breath sounds: Normal breath sounds.  Skin:    General: Skin is warm and dry.  Neurological:     Mental Status: He is alert.  Psychiatric:         Behavior: Behavior normal.           Assessment & Plan:  1. Renal insufficiency Patient will do lab functions before next visit patient is trying to eat healthy and take in plenty liquids - Lipid panel - Comprehensive metabolic panel  2. Centrilobular emphysema ([place]) Still smokes was encouraged to quit smoking continue inhalers as needed - Lipid panel - Comprehensive metabolic panel  3. Secondary cardiomyopathy ([place]) Denies any chest pain denies any shortness of breath out of character with his COPD no signs of overt failure currently - Lipid panel - Comprehensive metabolic panel  4. HTN (hypertension), benign Blood pressure decent control continue current measures - Lipid panel - Comprehensive metabolic panel  5. Weakness Significant muscle weakness deconditioning related to age patient was encouraged to stay as active as possible uses walker to prevent falls - Lipid panel - Comprehensive metabolic panel  6. Encounter for screening for lipid disorder Screening cholesterol - Lipid panel - Comprehensive metabolic panel Recheck in 4 months

## 2021-04-17 DIAGNOSIS — H3530 Unspecified macular degeneration: Secondary | ICD-10-CM | POA: Diagnosis not present

## 2021-04-17 DIAGNOSIS — H3589 Other specified retinal disorders: Secondary | ICD-10-CM | POA: Diagnosis not present

## 2021-04-23 ENCOUNTER — Other Ambulatory Visit: Payer: Self-pay | Admitting: Family Medicine

## 2021-04-23 NOTE — Telephone Encounter (Signed)
12 refills on the fluticasone 5 refills on tramadol sent back I will sign

## 2021-04-23 NOTE — Telephone Encounter (Signed)
Med check up 02/23/21

## 2021-05-07 DIAGNOSIS — H35363 Drusen (degenerative) of macula, bilateral: Secondary | ICD-10-CM | POA: Diagnosis not present

## 2021-05-07 DIAGNOSIS — H17813 Minor opacity of cornea, bilateral: Secondary | ICD-10-CM | POA: Diagnosis not present

## 2021-05-25 ENCOUNTER — Telehealth: Payer: Self-pay

## 2021-05-25 DIAGNOSIS — E061 Subacute thyroiditis: Secondary | ICD-10-CM

## 2021-05-25 NOTE — Telephone Encounter (Signed)
Can you update his labs?

## 2021-05-25 NOTE — Telephone Encounter (Signed)
Orders updated and sent to lab.

## 2021-05-26 DIAGNOSIS — E061 Subacute thyroiditis: Secondary | ICD-10-CM | POA: Diagnosis not present

## 2021-05-27 ENCOUNTER — Other Ambulatory Visit: Payer: Self-pay

## 2021-05-27 ENCOUNTER — Telehealth: Payer: Self-pay | Admitting: Family Medicine

## 2021-05-27 ENCOUNTER — Ambulatory Visit: Payer: Medicare Other | Admitting: "Endocrinology

## 2021-05-27 LAB — T3, FREE: T3, Free: 2.1 pg/mL (ref 2.0–4.4)

## 2021-05-27 LAB — TSH: TSH: 0.578 u[IU]/mL (ref 0.450–4.500)

## 2021-05-27 LAB — T4, FREE: Free T4: 1.85 ng/dL — ABNORMAL HIGH (ref 0.82–1.77)

## 2021-05-27 MED ORDER — ALBUTEROL SULFATE HFA 108 (90 BASE) MCG/ACT IN AERS: 2.0000 | INHALATION_SPRAY | RESPIRATORY_TRACT | 5 refills | Status: DC | PRN

## 2021-05-27 NOTE — Telephone Encounter (Signed)
[place] requesting refill on Albuterol HFA 90 MCG. Inhale 2 puffs po every 4 hours prn wheezing. Pt last seen 02/23/21 for renal insufficiency. Please advise. Thank you

## 2021-05-27 NOTE — Telephone Encounter (Signed)
Refills sent to pharmacy. 

## 2021-05-27 NOTE — Telephone Encounter (Signed)
6 refills °

## 2021-05-28 DIAGNOSIS — L738 Other specified follicular disorders: Secondary | ICD-10-CM | POA: Diagnosis not present

## 2021-05-28 DIAGNOSIS — Z85828 Personal history of other malignant neoplasm of skin: Secondary | ICD-10-CM | POA: Diagnosis not present

## 2021-05-28 DIAGNOSIS — Z872 Personal history of diseases of the skin and subcutaneous tissue: Secondary | ICD-10-CM | POA: Diagnosis not present

## 2021-05-28 DIAGNOSIS — C44622 Squamous cell carcinoma of skin of right upper limb, including shoulder: Secondary | ICD-10-CM | POA: Diagnosis not present

## 2021-05-28 DIAGNOSIS — L570 Actinic keratosis: Secondary | ICD-10-CM | POA: Diagnosis not present

## 2021-05-28 DIAGNOSIS — D485 Neoplasm of uncertain behavior of skin: Secondary | ICD-10-CM | POA: Diagnosis not present

## 2021-05-28 DIAGNOSIS — D0461 Carcinoma in situ of skin of right upper limb, including shoulder: Secondary | ICD-10-CM | POA: Diagnosis not present

## 2021-05-28 DIAGNOSIS — L578 Other skin changes due to chronic exposure to nonionizing radiation: Secondary | ICD-10-CM | POA: Diagnosis not present

## 2021-05-28 DIAGNOSIS — Z859 Personal history of malignant neoplasm, unspecified: Secondary | ICD-10-CM | POA: Diagnosis not present

## 2021-06-01 ENCOUNTER — Ambulatory Visit: Payer: Medicare Other | Admitting: "Endocrinology

## 2021-06-01 ENCOUNTER — Encounter: Payer: Self-pay | Admitting: "Endocrinology

## 2021-06-01 ENCOUNTER — Other Ambulatory Visit: Payer: Self-pay

## 2021-06-01 VITALS — BP 134/82 | HR 64 | Ht 68.0 in | Wt 196.2 lb

## 2021-06-01 DIAGNOSIS — E061 Subacute thyroiditis: Secondary | ICD-10-CM

## 2021-06-01 NOTE — Progress Notes (Signed)
06/01/2021, 2:50 PM  Endocrinology follow-up note   Subjective:    Patient ID: Gerald Hall, male    DOB: Oct 09, 1935, PCP Kathyrn Drown, MD   Past Medical History:  Diagnosis Date   Arthritis    Balance problem    Uses a walker   Blood in urine    Sees urology yearly   Deafness in right ear age 86   Hyperlipidemia    Hypertension    PSVT (paroxysmal supraventricular tachycardia) ([place])    Temporal arteritis ([place]) 2016   Urinary incontinence    Past Surgical History:  Procedure Laterality Date   BACK SURGERY  2010   [place]     CATARACT EXTRACTION Bilateral 07/30/14, 08/13/2014   CERVICAL SPINE SURGERY  09/01/15   C 4-5 , [facility]   COLONOSCOPY  12/19/2007   RMR: 1. External hemorrohids, otherwise normal rectum.2. Normal colon. 3. Normal terminal ileum.   COLONOSCOPY N/A 12/13/2014   Procedure: COLONOSCOPY;  Surgeon: Daneil Dolin, MD;  Location: [facility];  Service: Endoscopy;  Laterality: N/A;  11:15 Pt Request Time   KNEE SURGERY Left    around 2000, arthroscopic   TOTAL HIP ARTHROPLASTY Right 02/23/2017   Procedure: TOTAL HIP ARTHROPLASTY ANTERIOR APPROACH;  Surgeon: Hessie Knows, MD;  Location: [facility] ORS;  Service: Orthopedics;  Laterality: Right;   Social History   Socioeconomic History   Marital status: Married    Spouse name: Pamala Hurry   Number of children: 2   Years of education: 16   Highest education level: Not on file  Occupational History    Comment: retired Software engineer  Tobacco Use   Smoking status: Every Day    Packs/day: 0.50    Types: Cigarettes   Smokeless tobacco: Never   Tobacco comments:    08/18/15 1/2 - 3/4 pack cigarettes daily  Vaping Use   Vaping Use: Never used  Substance and Sexual Activity   Alcohol use: Yes    Alcohol/week: 0.0 standard drinks    Comment: mixed drink 1-2 times per month, social   Drug use: No   Sexual activity: Not on file   Other Topics Concern   Not on file  Social History Narrative   Lives at home with wife, who has PD   Caffeine use- coffee 8-10 cups daily   Social Determinants of Health   Financial Resource Strain: Low Risk    Difficulty of Paying Living Expenses: Not hard at all  Food Insecurity: No Food Insecurity   Worried About Charity fundraiser in the Last Year: Never true   [place] in the Last Year: Never true  Transportation Needs: No Transportation Needs   Lack of Transportation (Medical): No   Lack of Transportation (Non-Medical): No  Physical Activity: Inactive   Days of Exercise per Week: 0 days   Minutes of Exercise per Session: 0 min  Stress: No Stress Concern Present   Feeling of Stress : Not at all  Social Connections: Moderately Isolated   Frequency of Communication with Friends and Family: More than three times a week   Frequency of Social Gatherings with Friends and Family: Once a week   Attends Religious  Services: More than 4 times per year   Active Member of Clubs or Organizations: No   Attends Archivist Meetings: Never   Marital Status: Widowed   Family History  Problem Relation Age of Onset   Colon cancer Brother    Diabetes Brother    Diabetes Brother    Outpatient Encounter Medications as of 06/01/2021  Medication Sig   acetaminophen (TYLENOL) 325 MG tablet Take by mouth.   albuterol (VENTOLIN HFA) 108 (90 Base) MCG/ACT inhaler Inhale 2 puffs into the lungs every 4 (four) hours as needed for wheezing.   amiodarone (PACERONE) 200 MG tablet Take 1 tablet (200 mg total) by mouth daily.   aspirin EC 325 MG EC tablet Take 1 tablet (325 mg total) by mouth daily.   budesonide-formoterol (SYMBICORT) 160-4.5 MCG/ACT inhaler Inhale 2 puffs into the lungs 2 (two) times daily.   carvedilol (COREG) 3.125 MG tablet Take one tablet by mouth twice a day with meals (breaksfast and supper)   cholecalciferol (VITAMIN D) 400 units TABS tablet Take 400 Units by  mouth daily.    Dextromethorphan-guaiFENesin (MUCINEX DM) 30-600 MG TB12 Take 1 tablet by mouth daily.    finasteride (PROSCAR) 5 MG tablet Take 1 tablet (5 mg total) by mouth daily.   fluticasone (FLONASE) 50 MCG/ACT nasal spray SPRAY 2 SPRAYS INTO EACH NOSTRIL ONCE DAILY   hydrochlorothiazide (HYDRODIURIL) 25 MG tablet Take 1 tablet (25 mg total) by mouth daily.   mirabegron ER (MYRBETRIQ) 25 MG TB24 tablet daily.   neomycin-polymyxin-dexameth (MAXITROL) 0.1 % OINT  (Patient not taking: Reported on 02/10/2021)   polyvinyl alcohol (LIQUIFILM TEARS) 1.4 % ophthalmic solution Administer 1 drop to both eyes Two (2) times a day.   potassium chloride (KLOR-CON) 10 MEQ tablet Take 1 tablet (10 mEq total) by mouth 2 (two) times daily.   rOPINIRole (REQUIP) 2 MG tablet Take 2 tablets (4 mg total) by mouth at bedtime. TAKE 1 TABLET BY MOUTH AT LATE AFTERNOON AND TAKE ONE TABLET AT BEDTIME   senna-docusate (SENOKOT-S) 8.6-50 MG tablet Take by mouth.   tamsulosin (FLOMAX) 0.4 MG CAPS capsule Take one capsule by mouth twice daily   traMADol (ULTRAM) 50 MG tablet TAKE (1) TABLET BY MOUTH EVERY TWELVE HOURS AS NEEDED.   vitamin C (ASCORBIC ACID) 500 MG tablet Take 500 mg by mouth daily.   Zinc Acetate, Oral, (ZINC ACETATE PO) Take 1 tablet by mouth daily.   [DISCONTINUED] potassium chloride (KLOR-CON) 10 MEQ tablet TAKE 1 TABLET BY MOUTH TWICE DAILY   No facility-administered encounter medications on file as of 06/01/2021.   ALLERGIES: Allergies  Allergen Reactions   Penicillins     GI UPSET     VACCINATION STATUS: Immunization History  Administered Date(s) Administered   Fluad Quad(high Dose 65+) 08/12/2020   Influenza,inj,Quad PF,6+ Mos 07/20/2016, 08/07/2018, 08/22/2019   Influenza-Unspecified 09/03/2013   Moderna Sars-Covid-2 Vaccination 10/30/2019, 11/27/2019, 08/28/2020   Pneumococcal Conjugate-13 09/03/2014   Pneumococcal Polysaccharide-23 07/20/2016   Zoster Recombinat (Shingrix)  09/26/2017, 02/23/2018    HPI Gerald Hall is 86 y.o. male who presents today with repeat thyroid function tests.  He was seen in consultation last year due to abnormal thyroid function tests consistent with transient thyrotoxicosis related to thyroiditis.  He remains on treatment with amiodarone due to cardiac dysrhythmia. [id number], Elayne Snare, MD.  He is not currently on antithyroid intervention.  He has no new complaints today.  He denies palpitations, tremors, significant weight change.  He is currently on  200 mg of amiodarone daily.   -  His thyroid uptake and scan on September 09, 2020 revealed low uptake of 1.3% in 24 hours. -His previous visit labs show slight elevation on free T4.  He remains active smoker.  He is currently being treated for squamous cell carcinoma of the skin over his left upper extremity.  He does not have acute complaints today.  Review of Systems Limited as above.  Objective:    Vitals with BMI 06/01/2021 02/23/2021 02/10/2021  Height '5\' 8"'$  '5\' 8"'$  (No Data)  Weight 196 lbs 3 oz 193 lbs (No Data)  BMI [id number] -  Systolic [id number] 0000000 (No Data)  Diastolic 82 86 (No Data)  Pulse 64 64 (No Data)    BP 134/82   Pulse 64   Ht '5\' 8"'$  (1.727 m)   Wt 196 lb 3.2 oz (89 kg)   BMI 29.83 kg/m   Wt Readings from Last 3 Encounters:  06/01/21 196 lb 3.2 oz (89 kg)  02/23/21 193 lb (87.5 kg)  12/17/20 193 lb (87.5 kg)    Physical Exam  Constitutional:  Body mass index is 29.83 kg/m.,  not in acute distress, normal state of mind, + relates with a walker. Eyes: PERRLA, EOMI, no exophthalmos   CMP ( most recent) CMP     Component Value Date/Time   NA 137 11/24/2020 1519   K 3.7 11/24/2020 1519   CL 99 11/24/2020 1519   CO2 27 11/24/2020 1519   GLUCOSE 125 (H) 11/24/2020 1519   GLUCOSE 104 (H) 02/24/2019 0548   BUN 21 11/24/2020 1519   CREATININE 1.52 (H) 11/24/2020 1519   CREATININE 1.14 09/06/2014 1259   CALCIUM 9.4 11/24/2020 1519   PROT 6.8 01/23/2020  1455   ALBUMIN 4.0 01/23/2020 1455   AST 13 01/23/2020 1455   ALT 12 01/23/2020 1455   ALKPHOS 97 01/23/2020 1455   BILITOT 0.5 01/23/2020 1455   GFRNONAA 42 (L) 11/24/2020 1519   GFRAA 48 (L) 11/24/2020 1519     Diabetic Labs (most recent): Lab Results  Component Value Date   HGBA1C 5.6 08/18/2015     Lipid Panel ( most recent) Lipid Panel     Component Value Date/Time   CHOL 183 08/05/2020 0932   TRIG 113 08/05/2020 0932   HDL 40 08/05/2020 0932   CHOLHDL 4.6 08/05/2020 0932   CHOLHDL 3.7 09/06/2014 1259   VLDL 29 09/06/2014 1259   LDLCALC 122 (H) 08/05/2020 0932   LABVLDL 21 08/05/2020 0932      Lab Results  Component Value Date   TSH 0.578 05/26/2021   TSH 0.482 02/20/2021   TSH 0.48 02/20/2021   TSH 0.652 11/24/2020   TSH 0.379 (L) 08/05/2020   TSH 0.658 05/21/2020   TSH 0.376 (L) 01/23/2020   TSH 1.047 02/23/2019   TSH 0.752 08/06/2015   FREET4 1.85 (H) 05/26/2021   FREET4 1.76 02/20/2021   FREET4 1.60 11/24/2020   FREET4 1.79 (H) 08/05/2020   FREET4 1.81 (H) 05/21/2020        Thyroid uptake and scan from September 09, 2020  FINDINGS: Very low uptake within thyroid gland.  4 hour I-123 uptake = 1.3% (normal 5-20%),  24 hour I-123 uptake = 1.3% (normal 10-30%)   IMPRESSION: Very low uptake within the thyroid gland. Recommend clinical correlation for subacute thyroiditis.   Assessment & Plan:   1. Subacute thyroiditis -I reviewed his recent thyroid function tests which are consistent with euthyroid state.  he has  had subacute thyroiditis most likely related to the background history of amiodarone treatment.  His previous presentation was consistent with transient slight thyrotoxicosis, which will not require any antithyroid intervention at this time.   He has responded to the brief course of prednisone intervention.     -I discussed the fact that amiodarone can still be utilized as his cardiologist sees fit and will manage the thyroid  dysfunction on the side as necessary. Smoking puts this patient at higher risk of health complications including COPD. The patient was counseled on the dangers of tobacco use, and was advised to quit.  Reviewed strategies to maximize success, including removing cigarettes and smoking materials from environment.  Evidently, he is not ready to quit smoking.  - I did not initiate any new prescriptions today.  He will return with repeat thyroid function test in 6 months. - he is advised to maintain close follow up with Kathyrn Drown, MD for primary care needs.   I spent 22 minutes in the care of the patient today including review of labs from Thyroid Function, CMP, and other relevant labs ; imaging/biopsy records (current and previous including abstractions from other facilities); face-to-face time discussing  his lab results and symptoms, medications doses, his options of short and long term treatment based on the latest standards of care / guidelines;   and documenting the encounter.  Gerald Hall  participated in the discussions, expressed understanding, and voiced agreement with the above plans.  All questions were answered to his satisfaction. he is encouraged to contact clinic should he have any questions or concerns prior to his return visit.   Follow up plan: Return in about 6 months (around 12/02/2021) for F/U with Pre-visit Labs.   Glade Lloyd, MD [facility] [address] [place], [place] [address] Phone: [phone]  Fax: [phone]     06/01/2021, 2:50 PM  This note was partially dictated with voice recognition software. Similar sounding words can be transcribed inadequately or may not  be corrected upon review.

## 2021-06-11 ENCOUNTER — Other Ambulatory Visit: Payer: Self-pay | Admitting: Family Medicine

## 2021-06-11 DIAGNOSIS — C44622 Squamous cell carcinoma of skin of right upper limb, including shoulder: Secondary | ICD-10-CM | POA: Diagnosis not present

## 2021-06-11 DIAGNOSIS — L720 Epidermal cyst: Secondary | ICD-10-CM | POA: Diagnosis not present

## 2021-07-02 ENCOUNTER — Ambulatory Visit: Payer: Medicare Other | Admitting: Cardiology

## 2021-07-14 ENCOUNTER — Telehealth: Payer: Self-pay | Admitting: Family Medicine

## 2021-07-14 DIAGNOSIS — R3915 Urgency of urination: Secondary | ICD-10-CM | POA: Diagnosis not present

## 2021-07-14 NOTE — Telephone Encounter (Signed)
Patient has appointment scheduled nurses called and scheduled him for the 22nd thank you

## 2021-07-14 NOTE — Telephone Encounter (Signed)
Patient requested to see Dr. Nicki Reaper as soon as possible. Stated he fell last week, feeling very weak and his ear is stopped up. Dr. Bary Leriche schedule is full with only "same day" appts. Patient has appointment today at 12:45 with urologist. Will not be available for phone call until around 2:00.  CB#  [phone]

## 2021-07-16 ENCOUNTER — Other Ambulatory Visit: Payer: Self-pay

## 2021-07-16 ENCOUNTER — Ambulatory Visit (INDEPENDENT_AMBULATORY_CARE_PROVIDER_SITE_OTHER): Payer: Medicare Other | Admitting: Family Medicine

## 2021-07-16 VITALS — BP 119/71 | HR 64 | Temp 97.3°F | Wt 186.2 lb

## 2021-07-16 DIAGNOSIS — I10 Essential (primary) hypertension: Secondary | ICD-10-CM

## 2021-07-16 DIAGNOSIS — R5383 Other fatigue: Secondary | ICD-10-CM

## 2021-07-16 DIAGNOSIS — M6281 Muscle weakness (generalized): Secondary | ICD-10-CM

## 2021-07-16 MED ORDER — HYDROCHLOROTHIAZIDE 12.5 MG PO TABS: ORAL_TABLET | ORAL | 1 refills | Status: DC

## 2021-07-16 NOTE — Progress Notes (Signed)
   Subjective:    Patient ID: Gerald Hall, male    DOB: 09/08/1935, 86 y.o.   MRN: [id number]  HPI Pt states that for the last week he has became weaker than normal. Pt left ear has been clogged up and is causing hearing loss. Pt did have fall about 2 weeks ago;no injuries.  Very nice patient Relates a lot of fatigue tiredness feeling rundown Denies muscle pain States muscle weakness mainly Denies any progression of the shortness of breath No chest pain shortness of breath or fevers out of character Moods are doing well Appetite doing well Has history of known lung disease renal insufficiency hypertension  Review of Systems     Objective:   Physical Exam Lungs clear bilateral heart regular pulse normal BP does drop from sitting to standing extremities no edema  Both ear canals look good no impaction     Assessment & Plan:  Relative hypotension Reduce HCTZ to 12.5 Follow-up again in 3 to 4 weeks Significant lab work ordered up to make sure there is not underlying issue going on including polymyalgia rheumatica.  Patient to follow-up sooner if any problems

## 2021-07-17 LAB — BASIC METABOLIC PANEL
BUN/Creatinine Ratio: 19 (ref 10–24)
BUN: 30 mg/dL — ABNORMAL HIGH (ref 8–27)
CO2: 26 mmol/L (ref 20–29)
Calcium: 10 mg/dL (ref 8.6–10.2)
Chloride: 99 mmol/L (ref 96–106)
Creatinine, Ser: 1.56 mg/dL — ABNORMAL HIGH (ref 0.76–1.27)
Glucose: 92 mg/dL (ref 65–99)
Potassium: 3.8 mmol/L (ref 3.5–5.2)
Sodium: 140 mmol/L (ref 134–144)
eGFR: 44 mL/min/{1.73_m2} — ABNORMAL LOW (ref >59)

## 2021-07-17 LAB — CBC WITH DIFFERENTIAL/PLATELET
Basophils Absolute: 0 10*3/uL (ref 0.0–0.2)
Basos: 0 %
EOS (ABSOLUTE): 0.2 10*3/uL (ref 0.0–0.4)
Eos: 2 %
Hematocrit: 47.5 % (ref 37.5–51.0)
Hemoglobin: 16.3 g/dL (ref 13.0–17.7)
Immature Grans (Abs): 0.1 10*3/uL (ref 0.0–0.1)
Immature Granulocytes: 1 %
Lymphocytes Absolute: 1.4 10*3/uL (ref 0.7–3.1)
Lymphs: 15 %
MCH: 32.5 pg (ref 26.6–33.0)
MCHC: 34.3 g/dL (ref 31.5–35.7)
MCV: 95 fL (ref 79–97)
Monocytes Absolute: 1.3 10*3/uL — ABNORMAL HIGH (ref 0.1–0.9)
Monocytes: 14 %
Neutrophils Absolute: 6.4 10*3/uL (ref 1.4–7.0)
Neutrophils: 68 %
Platelets: 165 10*3/uL (ref 150–450)
RBC: 5.01 x10E6/uL (ref 4.14–5.80)
RDW: 13.4 % (ref 11.6–15.4)
WBC: 9.3 10*3/uL (ref 3.4–10.8)

## 2021-07-17 LAB — HEPATIC FUNCTION PANEL
ALT: 15 IU/L (ref 0–44)
AST: 15 IU/L (ref 0–40)
Albumin: 4.1 g/dL (ref 3.6–4.6)
Alkaline Phosphatase: 89 IU/L (ref 44–121)
Bilirubin Total: 0.5 mg/dL (ref 0.0–1.2)
Bilirubin, Direct: 0.21 mg/dL (ref 0.00–0.40)
Total Protein: 6.6 g/dL (ref 6.0–8.5)

## 2021-07-17 LAB — C-REACTIVE PROTEIN: CRP: 8 mg/L (ref 0–10)

## 2021-07-17 LAB — CK: Total CK: 65 U/L (ref 30–208)

## 2021-07-17 LAB — SEDIMENTATION RATE: Sed Rate: 17 mm/hr (ref 0–30)

## 2021-08-10 ENCOUNTER — Ambulatory Visit (INDEPENDENT_AMBULATORY_CARE_PROVIDER_SITE_OTHER): Payer: Medicare Other | Admitting: Family Medicine

## 2021-08-10 ENCOUNTER — Other Ambulatory Visit: Payer: Self-pay

## 2021-08-10 VITALS — BP 135/73 | HR 63 | Temp 97.2°F | Ht 68.0 in | Wt 195.0 lb

## 2021-08-10 DIAGNOSIS — I10 Essential (primary) hypertension: Secondary | ICD-10-CM | POA: Diagnosis not present

## 2021-08-10 DIAGNOSIS — Z23 Encounter for immunization: Secondary | ICD-10-CM | POA: Diagnosis not present

## 2021-08-10 NOTE — Progress Notes (Signed)
   Subjective:    Patient ID: Gerald Hall, male    DOB: [birth year], 86 y.o.   MRN: [id number]  HPI Generalized weakness that comes and goes Patient states it hits him in waves.  At times he will feel like he has enough energy other times he gives out.  He uses his walker.  Does not do as much walking as he used to do.  Nutritionally he does try to do okay Follows with endocrinology for thyroid We reviewed over all of his recent labs   Review of Systems     Objective:   Physical Exam General-in no acute distress Eyes-no discharge Lungs-respiratory rate normal, CTA CV-no murmurs,RRR Extremities skin warm dry no edema Neuro grossly normal Behavior normal, alert        Assessment & Plan:  1. HTN (hypertension), benign Blood pressure good control sitting and standing continue current measures follow-up in December  2. Immunization due Flu shot today - Flu Vaccine QUAD High Dose(Fluad)  Intermittent weakness hard to determine the underlying cause of this but more than likely this is related into his age and deconditioning as well as progression of chronic health issues.  No specific findings were found on today's lab review

## 2021-08-18 ENCOUNTER — Encounter: Payer: Self-pay | Admitting: Cardiology

## 2021-08-18 ENCOUNTER — Ambulatory Visit: Payer: Medicare Other | Admitting: Cardiology

## 2021-08-18 VITALS — BP 132/72 | HR 58 | Ht 68.0 in | Wt 197.0 lb

## 2021-08-18 DIAGNOSIS — I471 Supraventricular tachycardia: Secondary | ICD-10-CM

## 2021-08-18 DIAGNOSIS — I10 Essential (primary) hypertension: Secondary | ICD-10-CM

## 2021-08-18 NOTE — Progress Notes (Signed)
Cardiology Office Note  Date: 08/18/2021   ID: Gerald, Hall 1935-08-06, MRN [id number]  PCP:  Kathyrn Drown, MD  Cardiologist:  Rozann Lesches, MD Electrophysiologist:  None   Chief Complaint  Patient presents with   Cardiac follow-up     History of Present Illness: Gerald Hall is an 86 y.o. male last seen in February.  He is here for a follow-up visit.  He does not describe any major change in status.  Still fairly unsteady on his feet, uses a rolling walker and denies any recent falls.  Amiodarone was increased to 200 mg daily at the last visit.  This is providing adequate suppression of PSVT at this point.  I personally reviewed his ECG today which shows sinus bradycardia with prolonged PR interval, nonspecific ST-T changes and QTc 471 ms.  He had lab work in August and September as noted below.  Continues to follow with Dr. Wolfgang Phoenix.  Past Medical History:  Diagnosis Date   Arthritis    Balance problem    Uses a walker   Blood in urine    Sees urology yearly   Deafness in right ear age 67   Hyperlipidemia    Hypertension    PSVT (paroxysmal supraventricular tachycardia) ([place])    Temporal arteritis ([place]) 2016   Urinary incontinence     Past Surgical History:  Procedure Laterality Date   BACK SURGERY  2010   [place]     CATARACT EXTRACTION Bilateral 07/30/14, 08/13/2014   CERVICAL SPINE SURGERY  09/01/15   C 4-5 , [place]   COLONOSCOPY  12/19/2007   RMR: 1. External hemorrohids, otherwise normal rectum.2. Normal colon. 3. Normal terminal ileum.   COLONOSCOPY N/A 12/13/2014   Procedure: COLONOSCOPY;  Surgeon: Daneil Dolin, MD;  [facility]: [facility];  Service: Endoscopy;  Laterality: N/A;  11:15 Pt Request Time   KNEE SURGERY Left    around 2000, arthroscopic   TOTAL HIP ARTHROPLASTY Right 02/23/2017   Procedure: TOTAL HIP ARTHROPLASTY ANTERIOR APPROACH;  Surgeon: Hessie Knows, MD;  Location: [facility] ORS;  Service:  Orthopedics;  Laterality: Right;    Current Outpatient Medications  Medication Sig Dispense Refill   acetaminophen (TYLENOL) 325 MG tablet Take by mouth.     albuterol (VENTOLIN HFA) 108 (90 Base) MCG/ACT inhaler Inhale 2 puffs into the lungs every 4 (four) hours as needed for wheezing. 18 g 5   amiodarone (PACERONE) 200 MG tablet Take 1 tablet (200 mg total) by mouth daily. 90 tablet 3   aspirin EC 325 MG EC tablet Take 1 tablet (325 mg total) by mouth daily. 30 tablet 0   budesonide-formoterol (SYMBICORT) 160-4.5 MCG/ACT inhaler Inhale 2 puffs into the lungs 2 (two) times daily. 10.2 g 5   carvedilol (COREG) 3.125 MG tablet TAKE (1) TABLET BY MOUTH TWICE A DAY WITH MEALS (BREAKFAST AND SUPPER) 180 tablet 0   cholecalciferol (VITAMIN D) 400 units TABS tablet Take 400 Units by mouth daily.      Dextromethorphan-guaiFENesin (MUCINEX DM) 30-600 MG TB12 Take 1 tablet by mouth daily.      finasteride (PROSCAR) 5 MG tablet TAKE 1 TABLET BY MOUTH ONCE DAILY. 90 tablet 0   hydrochlorothiazide (HYDRODIURIL) 12.5 MG tablet Take one tablet po each morning 90 tablet 1   mirabegron ER (MYRBETRIQ) 50 MG TB24 tablet Take 50 mg by mouth daily.     polyvinyl alcohol (LIQUIFILM TEARS) 1.4 % ophthalmic solution Administer 1 drop to  both eyes Two (2) times a day.     potassium chloride (KLOR-CON) 10 MEQ tablet TAKE 1 TABLET BY MOUTH TWICE DAILY 180 tablet 0   rOPINIRole (REQUIP) 2 MG tablet Take 2 tablets (4 mg total) by mouth at bedtime. TAKE 1 TABLET BY MOUTH AT LATE AFTERNOON AND TAKE ONE TABLET AT BEDTIME 180 tablet 1   senna-docusate (SENOKOT-S) 8.6-50 MG tablet Take by mouth.     tamsulosin (FLOMAX) 0.4 MG CAPS capsule TAKE (1) CAPSULE BY MOUTH TWICE DAILY. 180 capsule 0   traMADol (ULTRAM) 50 MG tablet TAKE (1) TABLET BY MOUTH EVERY TWELVE HOURS AS NEEDED. 60 tablet 5   vitamin C (ASCORBIC ACID) 500 MG tablet Take 500 mg by mouth daily.     Zinc Acetate, Oral, (ZINC ACETATE PO) Take 1 tablet by mouth  daily.     neomycin-polymyxin-dexameth (MAXITROL) 0.1 % OINT      No current facility-administered medications for this visit.   Allergies:  Penicillins   ROS: No orthopnea or PND.  Physical Exam: VS:  BP 132/72 (BP Location: Left Arm, Patient Position: Sitting, Cuff Size: Normal)   Pulse (!) 58   Ht 5\' 8"  (1.727 m)   Wt 197 lb (89.4 kg)   SpO2 97%   BMI 29.95 kg/m , BMI Body mass index is 29.95 kg/m.  Wt Readings from Last 3 Encounters:  08/18/21 197 lb (89.4 kg)  08/10/21 195 lb (88.5 kg)  07/16/21 186 lb 3.2 oz (84.5 kg)    General: Patient appears comfortable at rest. HEENT: Conjunctiva and lids normal, wearing a mask. Neck: Supple, no elevated JVP or carotid bruits, no thyromegaly. Lungs: Clear to auscultation, nonlabored breathing at rest. Cardiac: Regular rate and rhythm, no S3 or significant systolic murmur. Extremities: No pitting edema.  ECG:  An ECG dated 08/12/2020 was personally reviewed today and demonstrated:  Sinus rhythm with prolonged PR interval, low voltage, IVCD.  Recent Labwork: 05/26/2021: TSH 0.578 07/16/2021: ALT 15; AST 15; BUN 30; Creatinine, Ser 1.56; Hemoglobin 16.3; Platelets 165; Potassium 3.8; Sodium 140     Component Value Date/Time   CHOL 183 08/05/2020 0932   TRIG 113 08/05/2020 0932   HDL 40 08/05/2020 0932   CHOLHDL 4.6 08/05/2020 0932   CHOLHDL 3.7 09/06/2014 1259   VLDL 29 09/06/2014 1259   LDLCALC 122 (H) 08/05/2020 0932    Other Studies Reviewed Today:  Echocardiogram 02/24/2019:  1. The left ventricle has mildly reduced systolic function, with an ejection fraction of 45-50%. The cavity size was normal.  2. The right ventricle has normal systolc function. The cavity was mildly enlarged. There is no increase in right ventricular wall thickness.  3. The mitral valve is abnormal. Mild thickening of the mitral valve leaflet.  4. The tricuspid valve was grossly normal.  5. The aortic valve is tricuspid Mild thickening of the aortic  valve Mild calcification of the aortic valve.  6. The aortic root is normal in size and structure.  7. The interatrial septum was not assessed.   14-day event recorder 05/17/2019: 14-day event recorder reviewed.  Predominant rhythm is sinus with heart rate ranging from 50 bpm up to 107 bpm, average heart rate 68 bpm.  There were 7 episodes of NSVT noted, 4-5 beats at most.  Brief episode of ventricular bigeminy noted.  No sustained ventricular arrhythmias.  Several episodes of SVT were also noted, largely regular narrow complex tachycardia which could represent either atrial flutter or possibly a reentrant mechanism or atrial tachycardia.  There were no pauses.  Assessment and Plan:  1.  PSVT, continues on amiodarone for rhythm suppression and also low-dose Coreg.  I reviewed his lab work from August and September including LFTs and TSH.  ECG reviewed as well.  Continue with current plan.  2.  Essential hypertension, blood pressure control is reasonable today.  In addition to Coreg he is on HCTZ with follow-up with Dr. Wolfgang Phoenix.  Medication Adjustments/Labs and Tests Ordered: Current medicines are reviewed at length with the patient today.  Concerns regarding medicines are outlined above.   Tests Ordered: Orders Placed This Encounter  Procedures   EKG 12-Lead    Medication Changes: No orders of the defined types were placed in this encounter.   Disposition:  Follow up  6 months.  Signed, Satira Sark, MD, [facility] 08/18/2021 1:26 PM    [facility] [place], [place],  [address] Phone: [phone]; Fax: [phone]

## 2021-08-18 NOTE — Patient Instructions (Addendum)

## 2021-08-26 ENCOUNTER — Telehealth: Payer: Self-pay | Admitting: *Deleted

## 2021-08-26 NOTE — Chronic Care Management (AMB) (Signed)
  Chronic Care Management   Note  08/26/2021 Name: JOSIE MESA MRN: [id number] DOB: 09-12-1935  THAINE GARRIGA is a 86 y.o. year old male who is a primary care patient of Luking, Elayne Snare, MD. I reached out to [name] by phone today in response to a referral sent by Mr. [place] PCP.  Mr. Shuman was given information about Chronic Care Management services today including:  CCM service includes personalized support from designated clinical staff supervised by his physician, including individualized plan of care and coordination with other care providers 24/7 contact phone numbers for assistance for urgent and routine care needs. Service will only be billed when office clinical staff spend 20 minutes or more in a month to coordinate care. Only one practitioner may furnish and bill the service in a calendar month. The patient may stop CCM services at any time (effective at the end of the month) by phone call to the office staff. The patient is responsible for co-pay (up to 20% after annual deductible is met) if co-pay is required by the individual health plan.   Patient agreed to services and verbal consent obtained.   Follow up plan: Telephone appointment with care management team member scheduled for:08/28/21  [facility]  Direct Dial: [phone]

## 2021-08-28 ENCOUNTER — Ambulatory Visit (INDEPENDENT_AMBULATORY_CARE_PROVIDER_SITE_OTHER): Payer: Medicare Other | Admitting: Pharmacist

## 2021-08-28 DIAGNOSIS — I10 Essential (primary) hypertension: Secondary | ICD-10-CM

## 2021-08-28 DIAGNOSIS — J432 Centrilobular emphysema: Secondary | ICD-10-CM

## 2021-08-28 DIAGNOSIS — M8580 Other specified disorders of bone density and structure, unspecified site: Secondary | ICD-10-CM

## 2021-08-28 DIAGNOSIS — I471 Supraventricular tachycardia: Secondary | ICD-10-CM

## 2021-08-28 NOTE — Patient Instructions (Signed)
Gerald Hall,  It was great to talk to you today!  Please call me with any questions or concerns.   Visit Information   PATIENT GOALS/PLAN OF CARE:  Care Plan : Medication Management  Updates made by Beryle Lathe, [place] since 08/28/2021 12:00 AM     Problem: HTN, PSVT, COPD, Osteopenia   Priority: High  Onset Date: 08/28/2021     Long-Range Goal: Disease Progression Prevention   Start Date: 08/28/2021  Expected End Date: 11/26/2021  This Visit's Progress: On track  Priority: High  Note:   Current Barriers:  Unable to independently monitor therapeutic efficacy  Pharmacist Clinical Goal(s):  Patient will Achieve adherence to monitoring guidelines and medication adherence to achieve therapeutic efficacy through collaboration with PharmD and provider.   Interventions: 1:1 collaboration with Kathyrn Drown, MD regarding development and update of comprehensive plan of care as evidenced by provider attestation and co-signature Inter-disciplinary care team collaboration (see longitudinal plan of care) Comprehensive medication review performed; medication list updated in electronic medical record  Hypertension - New goal.: Blood pressure under good control. Blood pressure is at goal of <130/80 mmHg per 2017 AHA/ACC guidelines. Current medications: hydrochlorothiazide 12.5 mg by mouth once daily and carvedilol 3.125 mg by mouth twice daily Intolerances: none Taking medications as directed: yes Side effects thought to be attributed to current medication regimen: no Denies dizziness and lightheadedness. Current exercise: not discussed today Current home blood pressure: 130s/70s; HR 70s Continue hydrochlorothiazide 12.5 mg by mouth once daily and carvedilol 3.125 mg by mouth twice daily Encourage dietary sodium restriction/DASH diet Recommend home blood pressure monitoring to discuss at next visit Patient counseled on smoking cessation  Chronic Obstructive Pulmonary Disease  - New goal.: Controlled Patient continues to smoke and does not intend to stop smoking unless his chronic obstructive pulmonary disease gets worse. Currently smokes about 12 cigarettes per day.  Current treatment: albuterol metered dose (ProAir, Ventolin, Proventil) 2 puffs by mouth every 4 hours as needed for shortness of breath or wheezing and budesonide/formoterol (Symbicort HFA) 160-4.5 mcg 2 puffs by mouth twice daily Most recent Pulmonary Function Testing: unknown 0 exacerbations requiring treatment in the last 6 months  Current oxygen requirements: 0 L Continue albuterol metered dose (ProAir, Ventolin, Proventil) 2 puffs by mouth every 4 hours as needed for shortness of breath or wheezing and budesonide/formoterol (Symbicort HFA) 160-4.5 mcg 2 puffs by mouth twice daily Patient counseled on smoking cessation  PSVT- New goal.: Followed by Dr. Domenic Polite Controlled. Most recent ECG: sinus bradycardia. Current rate and rhythm control: carvedilol 3.125 mg by mouth twice daily and amiodarone 200 mg by mouth once daily Anticoagulation: not indicated at present but is on aspirin 325 mg by mouth daily  Continue amiodarone and beta blocker per cardiology  Osteopenia - New goal.: Suboptimally managed Current medications: vitamin D 1000 units daily Intolerances: none Taking medications as directed: yes Side effects thought to be attributed to current medication regimen: no Last bone density scan (01/27/15): T score = -2.2 femur neck left FRAX Score: <3% hip fracture and <20% major osteoporotic fracture Falls reported in last year: 0 Recommend 1,000 mg of elemental calcium per day from diet or supplementation. Supplemental calcium not necessary if appropriate amount obtained through diet. Patient plans to start TUMS (calcium carbonate) based on our discussion today.  Recommend 800 - 2,000 IU of vitamin D supplementation per day to maintain adequate vitamin D levels Recommend walking, low impact  aerobic exercise, or strength training Recommend smoking cessation  Patient Goals/Self-Care Activities Patient will:  Take medications as prescribed Check blood pressure at least once daily, document, and provide at future appointments  Follow Up Plan: Telephone follow up appointment with care management team member scheduled for: 02/26/22      Consent to CCM Services: Mr. Theiler was given information about Chronic Care Management services including:  CCM service includes personalized support from designated clinical staff supervised by his physician, including individualized plan of care and coordination with other care providers 24/7 contact phone numbers for assistance for urgent and routine care needs. Service will only be billed when office clinical staff spend 20 minutes or more in a month to coordinate care. Only one practitioner may furnish and bill the service in a calendar month. The patient may stop CCM services at any time (effective at the end of the month) by phone call to the office staff. The patient will be responsible for cost sharing (co-pay) of up to 20% of the service fee (after annual deductible is met).  Patient agreed to services and verbal consent obtained.   Patient verbalizes understanding of instructions provided today and agrees to view in [place].   Telephone follow up appointment with care management team member scheduled for:02/26/22  Kennon Holter, PharmD Clinical Pharmacist [place] [phone]

## 2021-08-28 NOTE — Chronic Care Management (AMB) (Signed)
Chronic Care Management Pharmacy Note  08/28/2021 Name:  Gerald Hall MRN:  [id number] DOB:  [birth year]  Summary:  Osteopenia Recommend 1,000 mg of elemental calcium per day from diet or supplementation. Supplemental calcium not necessary if appropriate amount obtained through diet. Patient plans to start TUMS (calcium carbonate) based on our discussion today.  Recommend 800 - 2,000 IU of vitamin D supplementation per day to maintain adequate vitamin D levels  Subjective: Gerald Hall is an 87 y.o. year old male who is a primary patient of Luking, Elayne Snare, MD.  The CCM team was consulted for assistance with disease management and care coordination needs.    Engaged with patient by telephone for initial visit in response to provider referral for pharmacy case management and/or care coordination services.   Consent to Services:  The patient was given the following information about Chronic Care Management services today, agreed to services, and gave verbal consent: 1. CCM service includes personalized support from designated clinical staff supervised by the primary care provider, including individualized plan of care and coordination with other care providers 2. 24/7 contact phone numbers for assistance for urgent and routine care needs. 3. Service will only be billed when office clinical staff spend 20 minutes or more in a month to coordinate care. 4. Only one practitioner may furnish and bill the service in a calendar month. 5.The patient may stop CCM services at any time (effective at the end of the month) by phone call to the office staff. 6. The patient will be responsible for cost sharing (co-pay) of up to 20% of the service fee (after annual deductible is met). Patient agreed to services and consent obtained.  Patient Care Team: Kathyrn Drown, MD as PCP - General (Family Medicine) Satira Sark, MD as PCP - Cardiology (Cardiology) Kathyrn Drown, MD (Family  Medicine) Gala Romney Cristopher Estimable, MD as Consulting Physician (Gastroenterology) Minna Merritts, MD as Consulting Physician (Cardiology) Beryle Lathe, [facility] (Pharmacist)  Objective:  Lab Results  Component Value Date   CREATININE 1.56 (H) 07/16/2021   CREATININE 1.52 (H) 11/24/2020   CREATININE 1.73 (H) 08/05/2020    Lab Results  Component Value Date   HGBA1C 5.6 08/18/2015   Last diabetic Eye exam: No results found for: HMDIABEYEEXA  Last diabetic Foot exam: No results found for: HMDIABFOOTEX      Component Value Date/Time   CHOL 183 08/05/2020 0932   TRIG 113 08/05/2020 0932   HDL 40 08/05/2020 0932   CHOLHDL 4.6 08/05/2020 0932   CHOLHDL 3.7 09/06/2014 1259   VLDL 29 09/06/2014 1259   LDLCALC 122 (H) 08/05/2020 0932    Hepatic Function Latest Ref Rng & Units 07/16/2021 01/23/2020 06/11/2019  Total Protein 6.0 - 8.5 g/dL 6.6 6.8 6.5  Albumin 3.6 - 4.6 g/dL 4.1 4.0 4.0  AST 0 - 40 IU/L _0 ALT 0 - 44 IU/L _1 Alk Phosphatase 44 - 121 IU/L 89 97 90  Total Bilirubin 0.0 - 1.2 mg/dL 0.5 0.5 0.5  Bilirubin, Direct 0.00 - 0.40 mg/dL 0.21 0.20 0.18    Lab Results  Component Value Date/Time   TSH 0.578 05/26/2021 01:47 PM   TSH 0.482 02/20/2021 09:59 AM   FREET4 1.85 (H) 05/26/2021 01:47 PM   FREET4 1.76 02/20/2021 09:59 AM    CBC Latest Ref Rng & Units 07/16/2021 01/23/2020 04/03/2019  WBC 3.4 - 10.8 x10E3/uL 9.3 10.7 10.6  Hemoglobin 13.0 - 17.7 g/dL  16.3 17.1 17.0  Hematocrit 37.5 - 51.0 % 47.5 48.6 48.4  Platelets 150 - 450 x10E3/uL 165 218 190    No results found for: VD25OH  Clinical ASCVD: No  The ASCVD Risk score (Arnett DK, et al., 2019) failed to calculate for the following reasons:   The 2019 ASCVD risk score is only valid for ages 8 to 56    Social History   Tobacco Use  Smoking Status Every Day   Packs/day: 0.50   Types: Cigarettes  Smokeless Tobacco Never  Tobacco Comments   08/18/15 1/2 - 3/4 pack cigarettes daily   BP  Readings from Last 3 Encounters:  08/18/21 132/72  08/10/21 135/73  07/16/21 119/71   Pulse Readings from Last 3 Encounters:  08/18/21 (!) 58  08/10/21 63  07/16/21 64   Wt Readings from Last 3 Encounters:  08/18/21 197 lb (89.4 kg)  08/10/21 195 lb (88.5 kg)  07/16/21 186 lb 3.2 oz (84.5 kg)    Assessment: Review of patient past medical history, allergies, medications, health status, including review of consultants reports, laboratory and other test data, was performed as part of comprehensive evaluation and provision of chronic care management services.   SDOH:  (Social Determinants of Health) assessments and interventions performed:    CCM Care Plan  Allergies  Allergen Reactions   Cephalexin Diarrhea    And general weakness   Penicillins     GI UPSET     Medications Reviewed Today     Reviewed by Beryle Lathe, [facility] (Pharmacist) on 08/28/21 at 1324  Med List Status: <None>   Medication Order Taking? Sig Documenting Provider Last Dose Status Informant  acetaminophen (TYLENOL) 325 MG tablet [id number] Yes Take 1,000 mg by mouth every 6 (six) hours as needed for mild pain or headache. [provider] Taking Active Self  albuterol (VENTOLIN HFA) 108 (90 Base) MCG/ACT inhaler [id number] Yes Inhale 2 puffs into the lungs every 4 (four) hours as needed for wheezing. Kathyrn Drown, MD Taking Active            Med Note Waldo Laine, Gwenyth Allegra   Fri Aug 28, 2021  1:10 PM) Rarely uses  amiodarone (PACERONE) 200 MG tablet [id number] Yes Take 1 tablet (200 mg total) by mouth daily. Satira Sark, MD Taking Active   aspirin EC 325 MG EC tablet [id number] Yes Take 1 tablet (325 mg total) by mouth daily. Murlean Iba, MD Taking Active            Med Note Waldo Laine, Gwenyth Allegra   Fri Aug 28, 2021  1:11 PM)    budesonide-formoterol [facility]) 160-4.5 MCG/ACT inhaler [id number] Yes Inhale 2 puffs into the lungs 2 (two) times daily. Kathyrn Drown, MD Taking  Active   carvedilol (COREG) 3.125 MG tablet [id number] Yes TAKE (1) TABLET BY MOUTH TWICE A DAY WITH MEALS (BREAKFAST AND SUPPER) Kathyrn Drown, MD Taking Active   cholecalciferol (VITAMIN D) 400 units TABS tablet [id number] Yes Take 1,000 Units by mouth daily. [provider] Taking Active Self  Dextromethorphan-guaiFENesin [facility] DM) 30-600 MG TB12 [id number] Yes Take 1 tablet by mouth daily.  [provider] Taking Active   finasteride (PROSCAR) 5 MG tablet [id number] Yes TAKE 1 TABLET BY MOUTH ONCE DAILY. Kathyrn Drown, MD Taking Active   fluticasone (FLONASE) 50 MCG/ACT nasal spray [id number] Yes Place 2 sprays into both nostrils at bedtime. [provider] Taking Active   hydrochlorothiazide (HYDRODIURIL) 12.5 MG tablet [id number] Yes  Take one tablet po each morning Luking, Elayne Snare, MD Taking Active   mirabegron ER (MYRBETRIQ) 50 MG TB24 tablet [id number] Yes Take 50 mg by mouth daily. [provider] Taking Active   polyethylene glycol (MIRALAX / GLYCOLAX) 17 g packet [id number] Yes Take 17 g by mouth daily as needed. [provider] Taking Active   polyvinyl alcohol (LIQUIFILM TEARS) 1.4 % ophthalmic solution [id number] Yes Administer 1 drop to both eyes Two (2) times a day. [provider] Taking Active   potassium chloride (KLOR-CON) 10 MEQ tablet [id number] Yes TAKE 1 TABLET BY MOUTH TWICE DAILY Luking, Scott A, MD Taking Active   rOPINIRole (REQUIP) 2 MG tablet [id number] Yes Take 2 tablets (4 mg total) by mouth at bedtime. TAKE 1 TABLET BY MOUTH AT LATE AFTERNOON AND TAKE ONE TABLET AT BEDTIME Kathyrn Drown, MD Taking Active   tamsulosin (FLOMAX) 0.4 MG CAPS capsule [id number] Yes TAKE (1) CAPSULE BY MOUTH TWICE DAILY. Kathyrn Drown, MD Taking Active   traMADol (ULTRAM) 50 MG tablet [id number] Yes TAKE (1) TABLET BY MOUTH EVERY TWELVE HOURS AS NEEDED. Kathyrn Drown, MD Taking Active   vitamin C (ASCORBIC ACID) 500 MG tablet [id number] Yes  Take 500 mg by mouth daily. [provider] Taking Active Self  Zinc Acetate, Oral, (ZINC ACETATE PO) [id number] Yes Take 1 tablet by mouth daily. [provider] Taking Active Self  Med List Note Vicente Males, [place] 18/84/16 6063): Pt             Patient Active Problem List   Diagnosis Date Noted   Centrilobular emphysema ([place]) 11/12/2020   Secondary cardiomyopathy ([place]) 11/12/2020   Subacute thyroiditis 09/25/2020   Atrial fibrillation ([place]) 02/24/2019   Atrial fibrillation with RVR ([place]) 02/23/2019   Community acquired pneumonia 02/23/2019   Meibomian gland dysfunction (MGD) of both eyes 03/29/2018   Avascular necrosis of bone of right hip ([place]) 02/23/2017   Primary osteoarthritis involving multiple joints 02/21/2017   Smoker 02/21/2017   AMD (age-related macular degeneration), bilateral 11/10/2016   Insomnia 07/20/2016   Restless legs 07/20/2016   Spinal stenosis in cervical region 10/14/2015   Senile purpura ([place]) 04/18/2015   Osteopenia 01/28/2015   Spinal stenosis of lumbar region 01/20/2015   BPH (benign prostatic hyperplasia) 01/20/2015   Diverticulosis of colon without hemorrhage    Encounter for screening colonoscopy 11/21/2014   HTN (hypertension), benign 04/17/2014   Lumbar disc disease 03/05/2014    Immunization History  Administered Date(s) Administered   Fluad Quad(high Dose 65+) 08/12/2020, 08/10/2021   Influenza,inj,Quad PF,6+ Mos 07/20/2016, 08/07/2018, 08/22/2019   Influenza-Unspecified 09/03/2013   Moderna Sars-Covid-2 Vaccination 10/30/2019, 11/27/2019, 08/28/2020   Pneumococcal Conjugate-13 09/03/2014   Pneumococcal Polysaccharide-23 07/20/2016   Zoster Recombinat (Shingrix) 09/26/2017, 02/23/2018    Conditions to be addressed/monitored: HTN, COPD, Osteopenia, and PSVT  Care Plan : Medication Management  Updates made by Beryle Lathe, [place] since 08/28/2021 12:00 AM     Problem: HTN, PSVT, COPD, Osteopenia    Priority: High  Onset Date: 08/28/2021     Long-Range Goal: Disease Progression Prevention   Start Date: 08/28/2021  Expected End Date: 11/26/2021  This Visit's Progress: On track  Priority: High  Note:   Current Barriers:  Unable to independently monitor therapeutic efficacy  Pharmacist Clinical Goal(s):  Patient will Achieve adherence to monitoring guidelines and medication adherence to achieve therapeutic efficacy through collaboration with PharmD and provider.   Interventions: 1:1 collaboration with Kathyrn Drown, MD regarding development  and update of comprehensive plan of care as evidenced by provider attestation and co-signature Inter-disciplinary care team collaboration (see longitudinal plan of care) Comprehensive medication review performed; medication list updated in electronic medical record  Hypertension - New goal.: Blood pressure under good control. Blood pressure is at goal of <130/80 mmHg per 2017 AHA/ACC guidelines. Current medications: hydrochlorothiazide 12.5 mg by mouth once daily and carvedilol 3.125 mg by mouth twice daily Intolerances: none Taking medications as directed: yes Side effects thought to be attributed to current medication regimen: no Denies dizziness and lightheadedness. Current exercise: not discussed today Current home blood pressure: 130s/70s; HR 70s Continue hydrochlorothiazide 12.5 mg by mouth once daily and carvedilol 3.125 mg by mouth twice daily Encourage dietary sodium restriction/DASH diet Recommend home blood pressure monitoring to discuss at next visit Patient counseled on smoking cessation  Chronic Obstructive Pulmonary Disease - New goal.: Controlled Patient continues to smoke and does not intend to stop smoking unless his chronic obstructive pulmonary disease gets worse. Currently smokes about 12 cigarettes per day.  Current treatment: albuterol metered dose (ProAir, Ventolin, Proventil) 2 puffs by mouth every 4 hours as needed  for shortness of breath or wheezing and budesonide/formoterol (Symbicort HFA) 160-4.5 mcg 2 puffs by mouth twice daily Most recent Pulmonary Function Testing: unknown 0 exacerbations requiring treatment in the last 6 months  Current oxygen requirements: 0 L Continue albuterol metered dose (ProAir, Ventolin, Proventil) 2 puffs by mouth every 4 hours as needed for shortness of breath or wheezing and budesonide/formoterol (Symbicort HFA) 160-4.5 mcg 2 puffs by mouth twice daily Patient counseled on smoking cessation  PSVT- New goal.: Followed by Dr. Domenic Polite Controlled. Most recent ECG: sinus bradycardia. Current rate and rhythm control: carvedilol 3.125 mg by mouth twice daily and amiodarone 200 mg by mouth once daily Anticoagulation: not indicated at present but is on aspirin 325 mg by mouth daily  Continue amiodarone and beta blocker per cardiology  Osteopenia - New goal.: Suboptimally managed Current medications: vitamin D 1000 units daily Intolerances: none Taking medications as directed: yes Side effects thought to be attributed to current medication regimen: no Last bone density scan (01/27/15): T score = -2.2 femur neck left FRAX Score: <3% hip fracture and <20% major osteoporotic fracture Falls reported in last year: 0 Recommend 1,000 mg of elemental calcium per day from diet or supplementation. Supplemental calcium not necessary if appropriate amount obtained through diet. Patient plans to start TUMS (calcium carbonate) based on our discussion today.  Recommend 800 - 2,000 IU of vitamin D supplementation per day to maintain adequate vitamin D levels Recommend walking, low impact aerobic exercise, or strength training Recommend smoking cessation  Patient Goals/Self-Care Activities Patient will:  Take medications as prescribed Check blood pressure at least once daily, document, and provide at future appointments  Follow Up Plan: Telephone follow up appointment with care management  team member scheduled for: 02/26/22      Medication Assistance: None required.  Patient affirms current coverage meets needs.  Patient's preferred pharmacy is:  [place], [place] - [address] [place] [address] Phone: [phone] Fax: [place], [place] [address] [place] [address] Phone: [phone] Fax: [phone]  Follow Up:  Patient agrees to Care Plan and Follow-up.  Plan: Telephone follow up appointment with care management team member scheduled for:  02/26/22  Kennon Holter, PharmD Clinical Pharmacist [place] [phone]

## 2021-09-09 ENCOUNTER — Other Ambulatory Visit: Payer: Self-pay | Admitting: Cardiology

## 2021-09-09 ENCOUNTER — Other Ambulatory Visit: Payer: Self-pay | Admitting: Family Medicine

## 2021-09-23 DIAGNOSIS — F17210 Nicotine dependence, cigarettes, uncomplicated: Secondary | ICD-10-CM

## 2021-09-23 DIAGNOSIS — M8580 Other specified disorders of bone density and structure, unspecified site: Secondary | ICD-10-CM

## 2021-09-23 DIAGNOSIS — J432 Centrilobular emphysema: Secondary | ICD-10-CM | POA: Diagnosis not present

## 2021-09-23 DIAGNOSIS — I10 Essential (primary) hypertension: Secondary | ICD-10-CM | POA: Diagnosis not present

## 2021-09-23 DIAGNOSIS — I471 Supraventricular tachycardia: Secondary | ICD-10-CM

## 2021-10-15 ENCOUNTER — Ambulatory Visit (HOSPITAL_COMMUNITY)
Admission: RE | Admit: 2021-10-15 | Discharge: 2021-10-15 | Disposition: A | Discharge status: Home / Self Care | Payer: Medicare Other | Source: Ambulatory Visit | Attending: Family Medicine | Admitting: Family Medicine

## 2021-10-15 ENCOUNTER — Other Ambulatory Visit: Payer: Self-pay

## 2021-10-15 ENCOUNTER — Encounter: Payer: Self-pay | Admitting: Family Medicine

## 2021-10-15 ENCOUNTER — Other Ambulatory Visit: Payer: Self-pay | Admitting: Family Medicine

## 2021-10-15 ENCOUNTER — Ambulatory Visit (INDEPENDENT_AMBULATORY_CARE_PROVIDER_SITE_OTHER): Payer: Medicare Other | Admitting: Family Medicine

## 2021-10-15 VITALS — BP 164/75 | HR 61 | Temp 98.1°F | Wt 197.6 lb

## 2021-10-15 DIAGNOSIS — M545 Low back pain, unspecified: Secondary | ICD-10-CM | POA: Insufficient documentation

## 2021-10-15 DIAGNOSIS — M549 Dorsalgia, unspecified: Secondary | ICD-10-CM | POA: Diagnosis not present

## 2021-10-15 DIAGNOSIS — M2578 Osteophyte, vertebrae: Secondary | ICD-10-CM | POA: Diagnosis not present

## 2021-10-15 DIAGNOSIS — M40204 Unspecified kyphosis, thoracic region: Secondary | ICD-10-CM | POA: Diagnosis not present

## 2021-10-15 DIAGNOSIS — M4316 Spondylolisthesis, lumbar region: Secondary | ICD-10-CM | POA: Diagnosis not present

## 2021-10-15 MED ORDER — HYDROCODONE-ACETAMINOPHEN 5-325 MG PO TABS: 1.0000 | ORAL_TABLET | Freq: Three times a day (TID) | ORAL | 0 refills | Status: DC | PRN

## 2021-10-15 NOTE — Progress Notes (Signed)
Subjective:  Patient ID: Gerald Hall, male    DOB: [birth year]  Age: 86 y.o. MRN: [id number]  CC: Chief Complaint  Patient presents with   Back Pain    Pt fell on Tuesday. Pt states he "got off balance and went backwards". Hit chair and is having right sided lower back pain. Having spasms in back every now and again.     HPI:  86 year old male presents with the above complaint.  Patient suffered a fall on Tuesday.  He states that he was on the porch and lost his balance and fell backwards striking a chair.  He reports ongoing low back pain.  He has some bruising on the left flank.  Has some pain in the lower thoracic spine as well.  Has a history of spine surgery.  He has taken hydrocodone once for his pain.  No radicular symptoms.  No other associated symptoms.  No other complaints.  Patient Active Problem List   Diagnosis Date Noted   Acute left-sided low back pain without sciatica 10/15/2021   Centrilobular emphysema ([place]) 11/12/2020   Secondary cardiomyopathy ([place]) 11/12/2020   Subacute thyroiditis 09/25/2020   Atrial fibrillation ([place]) 02/24/2019   Primary osteoarthritis involving multiple joints 02/21/2017   Smoker 02/21/2017   AMD (age-related macular degeneration), bilateral 11/10/2016   Insomnia 07/20/2016   Restless legs 07/20/2016   Spinal stenosis in cervical region 10/14/2015   Senile purpura ([place]) 04/18/2015   Osteopenia 01/28/2015   Spinal stenosis of lumbar region 01/20/2015   BPH (benign prostatic hyperplasia) 01/20/2015   HTN (hypertension), benign 04/17/2014   Lumbar disc disease 03/05/2014    Social Hx   Social History   Socioeconomic History   Marital status: Married    Spouse name: Pamala Hurry   Number of children: 2   Years of education: 16   Highest education level: Not on file  Occupational History    Comment: retired Software engineer  Tobacco Use   Smoking status: Every Day    Packs/day: 0.50    Types: Cigarettes   Smokeless tobacco: Never    Tobacco comments:    08/18/15 1/2 - 3/4 pack cigarettes daily  Vaping Use   Vaping Use: Never used  Substance and Sexual Activity   Alcohol use: Yes    Alcohol/week: 0.0 standard drinks    Comment: mixed drink 1-2 times per month, social   Drug use: No   Sexual activity: Not on file  Other Topics Concern   Not on file  Social History Narrative   Lives at home with wife, who has PD   Caffeine use- coffee 8-10 cups daily   Social Determinants of Health   Financial Resource Strain: Low Risk    Difficulty of Paying Living Expenses: Not hard at all  Food Insecurity: No Food Insecurity   Worried About Charity fundraiser in the Last Year: Never true   [place] in the Last Year: Never true  Transportation Needs: No Transportation Needs   Lack of Transportation (Medical): No   Lack of Transportation (Non-Medical): No  Physical Activity: Inactive   Days of Exercise per Week: 0 days   Minutes of Exercise per Session: 0 min  Stress: No Stress Concern Present   Feeling of Stress : Not at all  Social Connections: Moderately Isolated   Frequency of Communication with Friends and Family: More than three times a week   Frequency of Social Gatherings with Friends and Family: Once a week   Attends  Religious Services: More than 4 times per year   Active Member of Clubs or Organizations: No   Attends Archivist Meetings: Never   Marital Status: Widowed    Review of Systems Per HPI  Objective:  BP (!) 164/75    Pulse 61    Temp 98.1 F (36.7 C)    Wt 197 lb 9.6 oz (89.6 kg)    SpO2 98%    BMI 30.04 kg/m   BP/Weight 10/15/2021 08/18/2021 48/54/6270  Systolic BP 350 093 818  Diastolic BP 75 72 73  Wt. (Lbs) 197.6 197 195  BMI 30.04 29.95 29.65    Physical Exam Constitutional:      General: He is not in acute distress.    Appearance: Normal appearance.  Eyes:     General:        Right eye: No discharge.        Left eye: No discharge.     Conjunctiva/sclera:  Conjunctivae normal.  Cardiovascular:     Rate and Rhythm: Normal rate and regular rhythm.  Pulmonary:     Effort: Pulmonary effort is normal.     Breath sounds: Normal breath sounds. No wheezing, rhonchi or rales.  Musculoskeletal:     Comments: Bruising noted on the left flank.  No significant tenderness to palpation of the lumbar spine.  Neurological:     Mental Status: He is alert.  Psychiatric:        Mood and Affect: Mood normal.        Behavior: Behavior normal.    Lab Results  Component Value Date   WBC 9.3 07/16/2021   HGB 16.3 07/16/2021   HCT 47.5 07/16/2021   PLT 165 07/16/2021   GLUCOSE 92 07/16/2021   CHOL 183 08/05/2020   TRIG 113 08/05/2020   HDL 40 08/05/2020   LDLCALC 122 (H) 08/05/2020   ALT 15 07/16/2021   AST 15 07/16/2021   NA 140 07/16/2021   K 3.8 07/16/2021   CL 99 07/16/2021   CREATININE 1.56 (H) 07/16/2021   BUN 30 (H) 07/16/2021   CO2 26 07/16/2021   TSH 0.578 05/26/2021   INR 0.92 02/22/2017   HGBA1C 5.6 08/18/2015     Assessment & Plan:   Problem List Items Addressed This Visit       Other   Acute left-sided low back pain without sciatica - Primary    Recent fall.  X-rays were obtained and were independently reviewed by me.  Interpretation: Degenerative changes noted in the lumbar spine and thoracic spine.  No acute fracture.  Hydrocodone as needed for pain.        Relevant Medications   HYDROcodone-acetaminophen (NORCO/VICODIN) 5-325 MG tablet   Other Relevant Orders   DG Lumbar Spine Complete (Completed)   DG Thoracic Spine W/Swimmers (Completed)    Meds ordered this encounter  Medications   HYDROcodone-acetaminophen (NORCO/VICODIN) 5-325 MG tablet    Sig: Take 1 tablet by mouth every 8 (eight) hours as needed for moderate pain.    Dispense:  15 tablet    Refill:  0    Follow-up:  PRN  [place]

## 2021-10-15 NOTE — Assessment & Plan Note (Signed)
Recent fall.  X-rays were obtained and were independently reviewed by me.  Interpretation: Degenerative changes noted in the lumbar spine and thoracic spine.  No acute fracture.  Hydrocodone as needed for pain.

## 2021-10-15 NOTE — Patient Instructions (Signed)
Xray at the hospital.  I will call with the results.  Dr. Lacinda Axon

## 2021-11-10 ENCOUNTER — Ambulatory Visit (INDEPENDENT_AMBULATORY_CARE_PROVIDER_SITE_OTHER): Payer: Medicare Other | Admitting: Family Medicine

## 2021-11-10 ENCOUNTER — Other Ambulatory Visit: Payer: Self-pay

## 2021-11-10 ENCOUNTER — Encounter: Payer: Self-pay | Admitting: Family Medicine

## 2021-11-10 VITALS — BP 122/76 | HR 64 | Temp 97.4°F | Ht 68.0 in | Wt 195.0 lb

## 2021-11-10 DIAGNOSIS — D692 Other nonthrombocytopenic purpura: Secondary | ICD-10-CM

## 2021-11-10 DIAGNOSIS — I429 Cardiomyopathy, unspecified: Secondary | ICD-10-CM | POA: Diagnosis not present

## 2021-11-10 DIAGNOSIS — R2689 Other abnormalities of gait and mobility: Secondary | ICD-10-CM

## 2021-11-10 DIAGNOSIS — J432 Centrilobular emphysema: Secondary | ICD-10-CM | POA: Diagnosis not present

## 2021-11-10 DIAGNOSIS — M6281 Muscle weakness (generalized): Secondary | ICD-10-CM

## 2021-11-10 NOTE — Progress Notes (Signed)
° °  Subjective:    Patient ID: Gerald Hall, male    DOB: 01-28-1935, 87 y.o.   MRN: [id number]  HPI Patient is here for follow up on weakness mainly reported upon standing and sitting due to leg weakness x 3 months  Patient has a difficult time standing up when he walks around he gets out of breath and sometimes gets out of energy and has to sit down he uses his walker has not had any falls recently but is concerned that he will because he gets unsteady Sob with much exertion  Patient still smokes but unlikely to ever quit Review of Systems     Objective:   Physical Exam Atrophy of the muscles noted.  This more than likely related to age lungs are clear bilateral heart regular Patient still suffers with senile purpura      Assessment & Plan:  Chronic heart disease followed by cardiologist continue current medicine COPD stable Patient smokes has no interest in quitting Continue current measures BPH continue Flomax with Proscar doing well with this Restless legs Requip is helping Poor sleep habits at nighttime related to age patient dealing with it without medication Chronic arthritic pain tramadol as needed Balance issues as well as weakness in the legs referral for home physical therapy 1. Centrilobular emphysema ([place]) Please see above continue inhalers cut back on smoking unlikely to quit  2. Secondary cardiomyopathy ([place]) Heart function stable on current medications  3. Senile purpura ([facility]) Stable  4. Balance problems Recommend physical therapy home patient is homebound  5. Muscle weakness Recommend physical therapy patient is homebound  Check labs in the spring

## 2021-11-11 ENCOUNTER — Other Ambulatory Visit: Payer: Self-pay

## 2021-11-11 DIAGNOSIS — M6281 Muscle weakness (generalized): Secondary | ICD-10-CM

## 2021-11-11 DIAGNOSIS — R2689 Other abnormalities of gait and mobility: Secondary | ICD-10-CM

## 2021-11-17 ENCOUNTER — Other Ambulatory Visit: Payer: Self-pay | Admitting: Family Medicine

## 2021-11-20 ENCOUNTER — Other Ambulatory Visit: Payer: Self-pay

## 2021-11-20 ENCOUNTER — Inpatient Hospital Stay (HOSPITAL_COMMUNITY)
Admission: EM | Admit: 2021-11-20 | Discharge: 2021-11-23 | DRG: 190 | Disposition: A | Discharge status: Home / Self Care | Payer: Medicare Other | Attending: Internal Medicine | Admitting: Internal Medicine

## 2021-11-20 ENCOUNTER — Encounter (HOSPITAL_COMMUNITY): Payer: Self-pay

## 2021-11-20 ENCOUNTER — Ambulatory Visit (INDEPENDENT_AMBULATORY_CARE_PROVIDER_SITE_OTHER): Payer: Medicare Other | Admitting: Family Medicine

## 2021-11-20 ENCOUNTER — Emergency Department (HOSPITAL_COMMUNITY): Payer: Medicare Other

## 2021-11-20 VITALS — HR 90 | Temp 97.8°F | Wt 193.0 lb

## 2021-11-20 DIAGNOSIS — Z96641 Presence of right artificial hip joint: Secondary | ICD-10-CM | POA: Diagnosis not present

## 2021-11-20 DIAGNOSIS — E785 Hyperlipidemia, unspecified: Secondary | ICD-10-CM | POA: Diagnosis present

## 2021-11-20 DIAGNOSIS — M1990 Unspecified osteoarthritis, unspecified site: Secondary | ICD-10-CM | POA: Diagnosis present

## 2021-11-20 DIAGNOSIS — Z7982 Long term (current) use of aspirin: Secondary | ICD-10-CM

## 2021-11-20 DIAGNOSIS — H9191 Unspecified hearing loss, right ear: Secondary | ICD-10-CM | POA: Diagnosis present

## 2021-11-20 DIAGNOSIS — E876 Hypokalemia: Secondary | ICD-10-CM | POA: Diagnosis not present

## 2021-11-20 DIAGNOSIS — I10 Essential (primary) hypertension: Secondary | ICD-10-CM | POA: Diagnosis not present

## 2021-11-20 DIAGNOSIS — R0602 Shortness of breath: Secondary | ICD-10-CM | POA: Diagnosis not present

## 2021-11-20 DIAGNOSIS — I428 Other cardiomyopathies: Secondary | ICD-10-CM | POA: Diagnosis not present

## 2021-11-20 DIAGNOSIS — Z20822 Contact with and (suspected) exposure to covid-19: Secondary | ICD-10-CM | POA: Diagnosis not present

## 2021-11-20 DIAGNOSIS — Z716 Tobacco abuse counseling: Secondary | ICD-10-CM

## 2021-11-20 DIAGNOSIS — J440 Chronic obstructive pulmonary disease with acute lower respiratory infection: Secondary | ICD-10-CM | POA: Diagnosis not present

## 2021-11-20 DIAGNOSIS — J449 Chronic obstructive pulmonary disease, unspecified: Secondary | ICD-10-CM | POA: Diagnosis present

## 2021-11-20 DIAGNOSIS — N400 Enlarged prostate without lower urinary tract symptoms: Secondary | ICD-10-CM | POA: Diagnosis present

## 2021-11-20 DIAGNOSIS — Z7951 Long term (current) use of inhaled steroids: Secondary | ICD-10-CM

## 2021-11-20 DIAGNOSIS — Z79891 Long term (current) use of opiate analgesic: Secondary | ICD-10-CM | POA: Diagnosis not present

## 2021-11-20 DIAGNOSIS — J441 Chronic obstructive pulmonary disease with (acute) exacerbation: Secondary | ICD-10-CM | POA: Diagnosis not present

## 2021-11-20 DIAGNOSIS — G2581 Restless legs syndrome: Secondary | ICD-10-CM

## 2021-11-20 DIAGNOSIS — J9601 Acute respiratory failure with hypoxia: Secondary | ICD-10-CM | POA: Diagnosis not present

## 2021-11-20 DIAGNOSIS — N3281 Overactive bladder: Secondary | ICD-10-CM | POA: Diagnosis not present

## 2021-11-20 DIAGNOSIS — Z7952 Long term (current) use of systemic steroids: Secondary | ICD-10-CM | POA: Diagnosis not present

## 2021-11-20 DIAGNOSIS — I482 Chronic atrial fibrillation, unspecified: Secondary | ICD-10-CM | POA: Diagnosis not present

## 2021-11-20 DIAGNOSIS — F17210 Nicotine dependence, cigarettes, uncomplicated: Secondary | ICD-10-CM | POA: Diagnosis present

## 2021-11-20 DIAGNOSIS — Z79899 Other long term (current) drug therapy: Secondary | ICD-10-CM | POA: Diagnosis not present

## 2021-11-20 DIAGNOSIS — F17200 Nicotine dependence, unspecified, uncomplicated: Secondary | ICD-10-CM | POA: Diagnosis present

## 2021-11-20 LAB — COMPREHENSIVE METABOLIC PANEL
ALT: 25 U/L (ref 0–44)
AST: 20 U/L (ref 15–41)
Albumin: 3.6 g/dL (ref 3.5–5.0)
Alkaline Phosphatase: 86 U/L (ref 38–126)
Anion gap: 9 (ref 5–15)
BUN: 25 mg/dL — ABNORMAL HIGH (ref 8–23)
CO2: 29 mmol/L (ref 22–32)
Calcium: 9 mg/dL (ref 8.9–10.3)
Chloride: 99 mmol/L (ref 98–111)
Creatinine, Ser: 1.31 mg/dL — ABNORMAL HIGH (ref 0.61–1.24)
GFR, Estimated: 54 mL/min — ABNORMAL LOW (ref >60)
Glucose, Bld: 108 mg/dL — ABNORMAL HIGH (ref 70–99)
Potassium: 3.4 mmol/L — ABNORMAL LOW (ref 3.5–5.1)
Sodium: 137 mmol/L (ref 135–145)
Total Bilirubin: 0.3 mg/dL (ref 0.3–1.2)
Total Protein: 6.9 g/dL (ref 6.5–8.1)

## 2021-11-20 LAB — BLOOD GAS, VENOUS
Acid-Base Excess: 6.1 mmol/L — ABNORMAL HIGH (ref 0.0–2.0)
Bicarbonate: 28.4 mmol/L — ABNORMAL HIGH (ref 20.0–28.0)
Drawn by: 27160
FIO2: 21
O2 Saturation: 71.8 %
Patient temperature: 36.5
pCO2, Ven: 47.8 mmHg (ref 44.0–60.0)
pH, Ven: 7.42 (ref 7.250–7.430)
pO2, Ven: 35.4 mmHg (ref 32.0–45.0)

## 2021-11-20 LAB — RESP PANEL BY RT-PCR (FLU A&B, COVID) ARPGX2
Influenza A by PCR: NEGATIVE
Influenza B by PCR: NEGATIVE
SARS Coronavirus 2 by RT PCR: NEGATIVE

## 2021-11-20 LAB — CBC
HCT: 47.2 % (ref 39.0–52.0)
Hemoglobin: 15.2 g/dL (ref 13.0–17.0)
MCH: 31.1 pg (ref 26.0–34.0)
MCHC: 32.2 g/dL (ref 30.0–36.0)
MCV: 96.7 fL (ref 80.0–100.0)
Platelets: 163 10*3/uL (ref 150–400)
RBC: 4.88 MIL/uL (ref 4.22–5.81)
RDW: 14.1 % (ref 11.5–15.5)
WBC: 6.9 10*3/uL (ref 4.0–10.5)
nRBC: 0 % (ref 0.0–0.2)

## 2021-11-20 LAB — TROPONIN I (HIGH SENSITIVITY)
Troponin I (High Sensitivity): 14 ng/L (ref <18)
Troponin I (High Sensitivity): 14 ng/L (ref <18)

## 2021-11-20 MED ORDER — ENOXAPARIN SODIUM 40 MG/0.4ML IJ SOSY
40.0000 mg | PREFILLED_SYRINGE | INTRAMUSCULAR | Status: DC
  Administered 2021-11-21 – 2021-11-23 (×3): 40 mg via SUBCUTANEOUS
  Filled 2021-11-20 (×3): qty 0.4

## 2021-11-20 MED ORDER — METHYLPREDNISOLONE SODIUM SUCC 125 MG IJ SOLR
80.0000 mg | INTRAMUSCULAR | Status: AC
  Administered 2021-11-21 – 2021-11-22 (×2): 80 mg via INTRAVENOUS
  Filled 2021-11-20 (×2): qty 2

## 2021-11-20 MED ORDER — CARVEDILOL 3.125 MG PO TABS
3.1250 mg | ORAL_TABLET | Freq: Two times a day (BID) | ORAL | Status: DC
  Administered 2021-11-21 – 2021-11-23 (×5): 3.125 mg via ORAL
  Filled 2021-11-20 (×5): qty 1

## 2021-11-20 MED ORDER — ROPINIROLE HCL 1 MG PO TABS
2.0000 mg | ORAL_TABLET | Freq: Every day | ORAL | Status: DC
Start: 2021-11-21 — End: 2021-11-23
  Administered 2021-11-20 – 2021-11-21 (×2): 2 mg via ORAL
  Filled 2021-11-20 (×4): qty 2

## 2021-11-20 MED ORDER — DM-GUAIFENESIN ER 30-600 MG PO TB12
1.0000 | ORAL_TABLET | Freq: Two times a day (BID) | ORAL | Status: DC
  Administered 2021-11-20 – 2021-11-23 (×6): 1 via ORAL
  Filled 2021-11-20 (×6): qty 1

## 2021-11-20 MED ORDER — TAMSULOSIN HCL 0.4 MG PO CAPS
0.4000 mg | ORAL_CAPSULE | Freq: Every day | ORAL | Status: DC
  Administered 2021-11-21 – 2021-11-23 (×3): 0.4 mg via ORAL
  Filled 2021-11-20 (×3): qty 1

## 2021-11-20 MED ORDER — IPRATROPIUM-ALBUTEROL 0.5-2.5 (3) MG/3ML IN SOLN
3.0000 mL | Freq: Four times a day (QID) | RESPIRATORY_TRACT | Status: DC
  Administered 2021-11-21 – 2021-11-23 (×9): 3 mL via RESPIRATORY_TRACT
  Filled 2021-11-20 (×9): qty 3

## 2021-11-20 MED ORDER — ALBUTEROL (5 MG/ML) CONTINUOUS INHALATION SOLN
7.5000 mg/h | INHALATION_SOLUTION | Freq: Once | RESPIRATORY_TRACT | Status: DC
  Filled 2021-11-20: qty 20

## 2021-11-20 MED ORDER — DOXYCYCLINE HYCLATE 100 MG PO TABS: 100.0000 mg | ORAL_TABLET | Freq: Two times a day (BID) | ORAL | 0 refills | Status: DC

## 2021-11-20 MED ORDER — IPRATROPIUM-ALBUTEROL 0.5-2.5 (3) MG/3ML IN SOLN
9.0000 mL | Freq: Once | RESPIRATORY_TRACT | Status: AC
  Administered 2021-11-20: 9 mL via RESPIRATORY_TRACT
  Filled 2021-11-20: qty 9

## 2021-11-20 MED ORDER — METHYLPREDNISOLONE SODIUM SUCC 125 MG IJ SOLR
125.0000 mg | Freq: Once | INTRAMUSCULAR | Status: AC
  Administered 2021-11-20: 125 mg via INTRAVENOUS
  Filled 2021-11-20: qty 2

## 2021-11-20 MED ORDER — AZITHROMYCIN 250 MG PO TABS
250.0000 mg | ORAL_TABLET | Freq: Every day | ORAL | Status: DC
  Administered 2021-11-21 – 2021-11-23 (×3): 250 mg via ORAL
  Filled 2021-11-20 (×3): qty 1

## 2021-11-20 MED ORDER — AZITHROMYCIN 250 MG PO TABS
500.0000 mg | ORAL_TABLET | Freq: Every day | ORAL | Status: AC
  Administered 2021-11-20: 500 mg via ORAL
  Filled 2021-11-20: qty 2

## 2021-11-20 MED ORDER — PANTOPRAZOLE SODIUM 40 MG PO TBEC
40.0000 mg | DELAYED_RELEASE_TABLET | Freq: Every day | ORAL | Status: DC
  Filled 2021-11-20 (×3): qty 1

## 2021-11-20 MED ORDER — POTASSIUM CHLORIDE CRYS ER 10 MEQ PO TBCR
10.0000 meq | EXTENDED_RELEASE_TABLET | Freq: Two times a day (BID) | ORAL | Status: DC
  Administered 2021-11-20 – 2021-11-23 (×6): 10 meq via ORAL
  Filled 2021-11-20 (×6): qty 1

## 2021-11-20 MED ORDER — PREDNISONE 20 MG PO TABS: ORAL_TABLET | ORAL | 0 refills | Status: DC

## 2021-11-20 MED ORDER — MUCINEX DM 30-600 MG PO TB12: 1.0000 | ORAL_TABLET | Freq: Every day | ORAL | Status: DC

## 2021-11-20 MED ORDER — FINASTERIDE 5 MG PO TABS
5.0000 mg | ORAL_TABLET | Freq: Every day | ORAL | Status: DC
  Administered 2021-11-21 – 2021-11-23 (×3): 5 mg via ORAL
  Filled 2021-11-20 (×3): qty 1

## 2021-11-20 MED ORDER — IPRATROPIUM-ALBUTEROL 0.5-2.5 (3) MG/3ML IN SOLN
3.0000 mL | RESPIRATORY_TRACT | Status: DC | PRN
  Administered 2021-11-20 – 2021-11-21 (×4): 3 mL via RESPIRATORY_TRACT
  Filled 2021-11-20 (×4): qty 3

## 2021-11-20 MED ORDER — MOMETASONE FURO-FORMOTEROL FUM 200-5 MCG/ACT IN AERO
2.0000 | INHALATION_SPRAY | Freq: Two times a day (BID) | RESPIRATORY_TRACT | Status: DC
  Administered 2021-11-21 – 2021-11-22 (×3): 2 via RESPIRATORY_TRACT
  Filled 2021-11-20: qty 8.8

## 2021-11-20 MED ORDER — ALBUTEROL SULFATE (2.5 MG/3ML) 0.083% IN NEBU
INHALATION_SOLUTION | RESPIRATORY_TRACT | Status: AC
  Administered 2021-11-20: 7.5 mg
  Filled 2021-11-20: qty 9

## 2021-11-20 MED ORDER — AMIODARONE HCL 200 MG PO TABS
200.0000 mg | ORAL_TABLET | Freq: Every day | ORAL | Status: DC
  Administered 2021-11-21 – 2021-11-23 (×3): 200 mg via ORAL
  Filled 2021-11-20 (×3): qty 1

## 2021-11-20 MED ORDER — MIRABEGRON ER 25 MG PO TB24
50.0000 mg | ORAL_TABLET | Freq: Every day | ORAL | Status: DC
  Administered 2021-11-21 – 2021-11-23 (×3): 50 mg via ORAL
  Filled 2021-11-20 (×3): qty 2

## 2021-11-20 NOTE — ED Notes (Signed)
Called RT regarding continuous neb

## 2021-11-20 NOTE — ED Notes (Signed)
Suspect Bronchitis , saturation appears low at 88 not sure if this is his normal as he says it runs 94. Still smokes , has nasal congestion with cough.

## 2021-11-20 NOTE — H&P (Signed)
History and Physical  [name]:[id number] DOB: 21-Oct-1935 DOA: 11/20/2021  Referring physician: Teressa Lower, MD PCP: Kathyrn Drown, MD  Patient coming from: Home  Chief Complaint: Shortness of breath  HPI: Gerald Hall is a 87 y.o. male with medical history significant for hypertension, COPD, BPH who presents to the emergency department due to 5 to 6 days of increasing shortness of breath associated with cough and runny nose which worsened within the last 2 days, he followed up with his PCP (Dr. Wolfgang Phoenix) today and was told to go to the ED for chest x-ray and further work-up.  He complains of left-sided chest soreness when he coughs, but denies fever, chills, nausea, vomiting, headache, blurry vision, numbness or tingling  ED Course:  In the emergency department, he was tachypneic, BP was 161/81 and other vital signs were within normal range.  Work-up in the ED showed normal CBC, BMP showed hypokalemia, BUNs/creatinine at 25/1.31 (creatinine is within baseline range).,  Troponin x1 was negative. Chest x-ray showed no active cardiopulmonary disease Breathing treatment was provided.  Hospitalist was asked to admit.  For further evaluation and management.  Review of Systems: A full 10 point Review of Systems was done, except as stated above, all other Review of systems were negative.  Past Medical History:  Diagnosis Date   Arthritis    Balance problem    Uses a walker   Blood in urine    Sees urology yearly   Deafness in right ear age 65   Hyperlipidemia    Hypertension    PSVT (paroxysmal supraventricular tachycardia) ([place])    Temporal arteritis ([place]) 2016   Urinary incontinence    Past Surgical History:  Procedure Laterality Date   BACK SURGERY  2010   [place]     CATARACT EXTRACTION Bilateral 07/30/14, 08/13/2014   CERVICAL SPINE SURGERY  09/01/15   C 4-5 , [place]   COLONOSCOPY  12/19/2007   RMR: 1. External hemorrohids, otherwise  normal rectum.2. Normal colon. 3. Normal terminal ileum.   COLONOSCOPY N/A 12/13/2014   Procedure: COLONOSCOPY;  Surgeon: Daneil Dolin, MD;  Location: [facility];  Service: Endoscopy;  Laterality: N/A;  11:15 Pt Request Time   KNEE SURGERY Left    around 2000, arthroscopic   TOTAL HIP ARTHROPLASTY Right 02/23/2017   Procedure: TOTAL HIP ARTHROPLASTY ANTERIOR APPROACH;  Surgeon: Hessie Knows, MD;  Location: [facility] ORS;  Service: Orthopedics;  Laterality: Right;    Social History:  reports that he has been smoking cigarettes. He has been smoking an average of .5 packs per day. He has never used smokeless tobacco. He reports current alcohol use. He reports that he does not use drugs.   Allergies  Allergen Reactions   Cephalexin Diarrhea    And general weakness   Penicillins     GI UPSET     Family History  Problem Relation Age of Onset   Colon cancer Brother    Diabetes Brother    Diabetes Brother      Prior to Admission medications   Medication Sig Start Date End Date Taking? Authorizing Provider  acetaminophen (TYLENOL) 325 MG tablet Take 1,000 mg by mouth every 6 (six) hours as needed for mild pain or headache.    [provider]  albuterol (VENTOLIN HFA) 108 (90 Base) MCG/ACT inhaler Inhale 2 puffs into the lungs every 4 (four) hours as needed for wheezing. 05/27/21   Kathyrn Drown, MD  amiodarone ([place])  200 MG tablet TAKE 1 TABLET BY MOUTH ONCE DAILY. 09/09/21   Satira Sark, MD  aspirin EC 325 MG EC tablet Take 1 tablet (325 mg total) by mouth daily. 02/26/19   Johnson, Clanford L, MD  budesonide-formoterol (SYMBICORT) 160-4.5 MCG/ACT inhaler Inhale 2 puffs into the lungs 2 (two) times daily. 02/23/21   Kathyrn Drown, MD  calcium carbonate (OS-CAL) 600 MG TABS tablet Take 600 mg by mouth daily with breakfast.    [provider]  carvedilol (COREG) 3.125 MG tablet TAKE (1) TABLET BY MOUTH TWICE A DAY WITH MEALS (BREAKFAST AND SUPPER) 06/11/21   Kathyrn Drown, MD  cholecalciferol (VITAMIN D) 400 units TABS tablet Take 1,000 Units by mouth daily.    [provider]  Dextromethorphan-guaiFENesin (MUCINEX DM) 30-600 MG TB12 Take 1 tablet by mouth daily.     [provider]  doxycycline (VIBRA-TABS) 100 MG tablet Take 1 tablet (100 mg total) by mouth 2 (two) times daily. 11/20/21   Kathyrn Drown, MD  finasteride (PROSCAR) 5 MG tablet TAKE 1 TABLET BY MOUTH ONCE DAILY. 09/09/21   Kathyrn Drown, MD  fluticasone (FLONASE) 50 MCG/ACT nasal spray Place 2 sprays into both nostrils at bedtime. 08/18/21   [provider]  hydrochlorothiazide (HYDRODIURIL) 12.5 MG tablet TAKE 1 TABLET BY MOUTH IN THE MORNING. 10/16/21   Kathyrn Drown, MD  HYDROcodone-acetaminophen (NORCO/VICODIN) 5-325 MG tablet Take 1 tablet by mouth every 8 (eight) hours as needed for moderate pain. Patient not taking: Reported on 11/20/2021 10/15/21   Coral Spikes, DO  mirabegron ER (MYRBETRIQ) 50 MG TB24 tablet Take 50 mg by mouth daily.    [provider]  oxymetazoline (AFRIN) 0.05 % nasal spray Place 1 spray into both nostrils at bedtime.    [provider]  polyethylene glycol (MIRALAX / GLYCOLAX) 17 g packet Take 17 g by mouth daily as needed.    [provider]  polyvinyl alcohol (LIQUIFILM TEARS) 1.4 % ophthalmic solution Administer 1 drop to both eyes Two (2) times a day.    [provider]  potassium chloride (KLOR-CON) 10 MEQ tablet TAKE 1 TABLET BY MOUTH TWICE DAILY 06/11/21   Kathyrn Drown, MD  predniSONE (DELTASONE) 20 MG tablet 2 qd for 5 days 11/20/21   Kathyrn Drown, MD  rOPINIRole (REQUIP) 2 MG tablet TAKE 1 TABLET BY MOUTH AT LATE AFTERNOON AND AT BEDTIME 09/09/21   Luking, Elayne Snare, MD  tamsulosin (FLOMAX) 0.4 MG CAPS capsule TAKE (1) CAPSULE BY MOUTH TWICE DAILY. 09/09/21   Kathyrn Drown, MD  traMADol (ULTRAM) 50 MG tablet TAKE (1) TABLET BY MOUTH EVERY TWELVE HOURS AS NEEDED. 11/18/21   Kathyrn Drown, MD  vitamin C (ASCORBIC ACID) 500 MG tablet Take 500 mg by mouth daily.    [provider]  Zinc Acetate, Oral, (ZINC ACETATE PO) Take 1 tablet by mouth daily.    [provider]    Physical Exam: BP (!) 171/78    Pulse 75    Temp 98 F (36.7 C) (Oral)    Resp (!) 24    Ht 5\' 8"  (1.727 m)    Wt 86.6 kg    SpO2 100%    BMI 29.04 kg/m   General: 87 y.o. year-old male well developed well nourished in no acute distress.  Alert and oriented x3. HEENT: NCAT, EOMI Neck: Supple, trachea medial Cardiovascular: Regular rate and rhythm with no rubs or gallops.  No thyromegaly or JVD noted.  No lower extremity edema. 2/4 pulses in all 4 extremities. Respiratory: Tachypnea.  Diffuse wheezing in all lobes on auscultation.   Abdomen: Soft, nontender nondistended with normal bowel sounds x4 quadrants. Muskuloskeletal: No cyanosis, clubbing or edema noted bilaterally Neuro: CN II-XII intact, strength 5/5 x 4, sensation, reflexes intact Skin: No ulcerative lesions noted or rashes Psychiatry: Mood is appropriate for condition and setting          Labs on Admission:  Basic Metabolic Panel: Recent Labs  Lab 11/20/21 1742  NA 137  K 3.4*  CL 99  CO2 29  GLUCOSE 108*  BUN 25*  CREATININE 1.31*  CALCIUM 9.0   Liver Function Tests: Recent Labs  Lab 11/20/21 1742  AST 20  ALT 25  ALKPHOS 86  BILITOT 0.3  PROT 6.9  ALBUMIN 3.6   No results for input(s): LIPASE, AMYLASE in the last 168 hours. No results for input(s): AMMONIA in the last 168 hours. CBC: Recent Labs  Lab 11/20/21 1734  WBC 6.9  HGB 15.2  HCT 47.2  MCV 96.7  PLT 163   Cardiac Enzymes: No results for input(s): CKTOTAL, CKMB, CKMBINDEX, TROPONINI in the last 168 hours.  BNP (last 3 results) No results for input(s): BNP in the last 8760 hours.  ProBNP (last 3 results) No results for input(s): PROBNP in the last 8760 hours.  CBG: No results for input(s): GLUCAP in the last 168  hours.  Radiological Exams on Admission: DG Chest 2 View  Result Date: 11/20/2021 CLINICAL DATA:  Shortness of Breath EXAM: CHEST - 2 VIEW COMPARISON:  Chest x-ray 04/17/2019. FINDINGS: The heart and mediastinal contours are unchanged.  Aortic calcific No focal consolidation. No pulmonary edema. No pleural effusion. No pneumothorax. No acute osseous abnormality. Multilevel degenerative changes spine. IMPRESSION: No active cardiopulmonary disease. Electronically Signed   By: Iven Finn M.D.   On: 11/20/2021 17:35    EKG: I independently viewed the EKG done and my findings are as followed: Normal sinus rhythm at a rate of 64 bpm with first-degree AV block  Assessment/Plan Present on Admission:  COPD with acute exacerbation ([place])  Principal Problem:   COPD with acute exacerbation ([place])  Acute exacerbation of COPD Continue duo nebs, Mucinex, Solu-Medrol, azithromycin. Continue Protonix to prevent steroid-induced ulcer Continue incentive spirometry and flutter valve  Essential hypertension Continue Coreg  Chronic atrial fibrillation Continue Coreg and amiodarone  BPH Continue Proscar and Flomax  RLS Continue Requip  Overactive bladder Continue Myrbetriq  Tobacco abuse Patient counseled on tobacco abuse cessation   DVT prophylaxis: Lovenox  Code Status: Full code  Family Communication: None at bedside  Disposition Plan:  Patient is from:                        home Anticipated DC to:                   SNF or family members home Anticipated DC date:               2-3 days Anticipated DC barriers:         Patient requires inpatient management due to acute respiratory COPD requiring intensive breathing treatment    Consults called: None  Admission status: Observation    Bernadette Hoit MD [facility]  11/20/2021, 8:30 PM

## 2021-11-20 NOTE — ED Notes (Signed)
RT starting neb treatment.

## 2021-11-20 NOTE — Progress Notes (Addendum)
° °  Subjective:    Patient ID: Gerald Hall, male    DOB: 17-Mar-1935, 87 y.o.   MRN: [id number]  HPI Patient says he has been coughing and having chest congestion. No fever. He states he had a cold for 10 days, got better and now is sick again. Patient with started off his head congestion and drainage now with chest congestion coughing increased shortness of breath working harder to breathe gets a little out of breath sitting still but gets severely out of breath with exertion O2 saturation 93 to 97% Tachypnea with respiratory rate at 28  Review of Systems     Objective:   Physical Exam Bilateral expiratory wheezes HEENT benign O2 saturation 93 to 97% Tachypnea with respiratory rate at 28      Assessment & Plan:  COPD exacerbation To go to ER for further evaluation Will need close follow-up upon discharge from ER or hospital Prednisone and doxycycline sent him to his pharmacy to cover him through the weekend should he be discharged from the ER later tonight otherwise we will follow him up closely upon discharge from hospital ER physician was spoken with Cannot rule out COVID Cannot rule out pneumonia Will need further testing in the ED

## 2021-11-20 NOTE — ED Triage Notes (Signed)
Pt arrives with c/o worsening SOB that started 5-6 days ago. Per pt, he has had some respiratory symptoms(cough and runny nose). Pt was seen by Dr. Wolfgang Phoenix today and was told to come here for chest x-ray and workup.

## 2021-11-20 NOTE — ED Provider Notes (Signed)
[facility] Provider Note  CSN: [id number] Arrival date & time: 11/20/21 1645  Chief Complaint(s) Shortness of Breath  HPI Gerald Hall is a 87 y.o. male with PMH COPD, HTN, HLD who presents emergency department for evaluation of shortness of breath.  Patient states that his shortness of breath is worsened over the last 2 days.  Associated cough but no fevers, chest pain, lower extremity swelling, orthopnea, nausea, vomiting, abdominal pain or other systemic symptoms.  He was seen at his primary care physician's office today was concerned about a COPD exacerbation and sent the patient to the emergency department.   Shortness of Breath Associated symptoms: cough    Past Medical History Past Medical History:  Diagnosis Date   Arthritis    Balance problem    Uses a walker   Blood in urine    Sees urology yearly   Deafness in right ear age 65   Hyperlipidemia    Hypertension    PSVT (paroxysmal supraventricular tachycardia) (HCC)    Temporal arteritis ([place]) 2016   Urinary incontinence    Patient Active Problem List   Diagnosis Date Noted   COPD with acute exacerbation ([place]) 11/20/2021   Hypokalemia 11/20/2021   Acute left-sided low back pain without sciatica 10/15/2021   Centrilobular emphysema ([place]) 11/12/2020   Secondary cardiomyopathy ([place]) 11/12/2020   Subacute thyroiditis 09/25/2020   Primary osteoarthritis involving multiple joints 02/21/2017   Smoker 02/21/2017   AMD (age-related macular degeneration), bilateral 11/10/2016   Insomnia 07/20/2016   Restless legs 07/20/2016   Spinal stenosis in cervical region 10/14/2015   Senile purpura ([place]) 04/18/2015   Osteopenia 01/28/2015   Spinal stenosis of lumbar region 01/20/2015   BPH (benign prostatic hyperplasia) 01/20/2015   HTN (hypertension), benign 04/17/2014   Lumbar disc disease 03/05/2014   Home Medication(s) Prior to Admission medications   Medication Sig Start Date End Date Taking?  Authorizing Provider  acetaminophen (TYLENOL) 325 MG tablet Take 1,000 mg by mouth every 6 (six) hours as needed for mild pain or headache.    [provider]  albuterol (VENTOLIN HFA) 108 (90 Base) MCG/ACT inhaler Inhale 2 puffs into the lungs every 4 (four) hours as needed for wheezing. 05/27/21   Kathyrn Drown, MD  amiodarone (PACERONE) 200 MG tablet TAKE 1 TABLET BY MOUTH ONCE DAILY. 09/09/21   Satira Sark, MD  aspirin EC 325 MG EC tablet Take 1 tablet (325 mg total) by mouth daily. 02/26/19   Johnson, Clanford L, MD  budesonide-formoterol (SYMBICORT) 160-4.5 MCG/ACT inhaler Inhale 2 puffs into the lungs 2 (two) times daily. 02/23/21   Kathyrn Drown, MD  calcium carbonate (OS-CAL) 600 MG TABS tablet Take 600 mg by mouth daily with breakfast.    [provider]  carvedilol (COREG) 3.125 MG tablet TAKE (1) TABLET BY MOUTH TWICE A DAY WITH MEALS (BREAKFAST AND SUPPER) 06/11/21   Kathyrn Drown, MD  cholecalciferol (VITAMIN D) 400 units TABS tablet Take 1,000 Units by mouth daily.    [provider]  Dextromethorphan-guaiFENesin (MUCINEX DM) 30-600 MG TB12 Take 1 tablet by mouth daily.     [provider]  doxycycline (VIBRA-TABS) 100 MG tablet Take 1 tablet (100 mg total) by mouth 2 (two) times daily. 11/20/21   Kathyrn Drown, MD  finasteride (PROSCAR) 5 MG tablet TAKE 1 TABLET BY MOUTH ONCE DAILY. 09/09/21   Kathyrn Drown, MD  fluticasone (FLONASE) 50 MCG/ACT nasal spray Place 2 sprays into both  nostrils at bedtime. 08/18/21   [provider]  hydrochlorothiazide (HYDRODIURIL) 12.5 MG tablet TAKE 1 TABLET BY MOUTH IN THE MORNING. 10/16/21   Kathyrn Drown, MD  HYDROcodone-acetaminophen (NORCO/VICODIN) 5-325 MG tablet Take 1 tablet by mouth every 8 (eight) hours as needed for moderate pain. Patient not taking: Reported on 11/20/2021 10/15/21   Coral Spikes, DO  mirabegron ER (MYRBETRIQ) 50 MG TB24 tablet Take 50 mg by mouth daily.    [provider]  oxymetazoline (AFRIN) 0.05 % nasal spray Place 1 spray into both nostrils at bedtime.    [provider]  polyethylene glycol (MIRALAX / GLYCOLAX) 17 g packet Take 17 g by mouth daily as needed.    [provider]  polyvinyl alcohol (LIQUIFILM TEARS) 1.4 % ophthalmic solution Administer 1 drop to both eyes Two (2) times a day.    [provider]  potassium chloride (KLOR-CON) 10 MEQ tablet TAKE 1 TABLET BY MOUTH TWICE DAILY 06/11/21   Kathyrn Drown, MD  predniSONE (DELTASONE) 20 MG tablet 2 qd for 5 days 11/20/21   Kathyrn Drown, MD  rOPINIRole (REQUIP) 2 MG tablet TAKE 1 TABLET BY MOUTH AT LATE AFTERNOON AND AT BEDTIME 09/09/21   Luking, Elayne Snare, MD  tamsulosin (FLOMAX) 0.4 MG CAPS capsule TAKE (1) CAPSULE BY MOUTH TWICE DAILY. 09/09/21   Kathyrn Drown, MD  traMADol (ULTRAM) 50 MG tablet TAKE (1) TABLET BY MOUTH EVERY TWELVE HOURS AS NEEDED. 11/18/21   Kathyrn Drown, MD  vitamin C (ASCORBIC ACID) 500 MG tablet Take 500 mg by mouth daily.    [provider]  Zinc Acetate, Oral, (ZINC ACETATE PO) Take 1 tablet by mouth daily.    [provider]                                                                                                                                    Past Surgical History Past Surgical History:  Procedure Laterality Date   BACK SURGERY  2010   [place]     CATARACT EXTRACTION Bilateral 07/30/14, 08/13/2014   CERVICAL SPINE SURGERY  09/01/15   C 4-5 , [facility]   COLONOSCOPY  12/19/2007   RMR: 1. External hemorrohids, otherwise normal rectum.2. Normal colon. 3. Normal terminal ileum.   COLONOSCOPY N/A 12/13/2014   Procedure: COLONOSCOPY;  Surgeon: Daneil Dolin, MD;  Location: [facility];  Service: Endoscopy;  Laterality: N/A;  11:15 Pt Request Time   KNEE SURGERY Left    around 2000, arthroscopic   TOTAL HIP ARTHROPLASTY Right 02/23/2017   Procedure: TOTAL HIP ARTHROPLASTY  ANTERIOR APPROACH;  Surgeon: Hessie Knows, MD;  Location: [facility] ORS;  Service: Orthopedics;  Laterality: Right;   Family History Family History  Problem Relation Age of Onset   Colon cancer Brother    Diabetes Brother    Diabetes Brother  Social History Social History   Tobacco Use   Smoking status: Every Day    Packs/day: 0.50    Types: Cigarettes   Smokeless tobacco: Never   Tobacco comments:    08/18/15 1/2 - 3/4 pack cigarettes daily  Vaping Use   Vaping Use: Never used  Substance Use Topics   Alcohol use: Yes    Alcohol/week: 0.0 standard drinks    Comment: mixed drink 1-2 times per month, social   Drug use: No   Allergies Cephalexin and Penicillins  Review of Systems Review of Systems  Respiratory:  Positive for cough and shortness of breath.    Physical Exam Vital Signs  I have reviewed the triage vital signs BP (!) 160/74 (BP Location: Left Arm)    Pulse 82    Temp 98.6 F (37 C) (Oral)    Resp (!) 22    Ht 5\' 8"  (1.727 m)    Wt 88 kg    SpO2 98%    BMI 29.48 kg/m   Physical Exam Vitals and nursing note reviewed.  Constitutional:      General: He is not in acute distress.    Appearance: He is well-developed.  HENT:     Head: Normocephalic and atraumatic.  Eyes:     Conjunctiva/sclera: Conjunctivae normal.  Cardiovascular:     Rate and Rhythm: Normal rate and regular rhythm.     Heart sounds: No murmur heard. Pulmonary:     Effort: Pulmonary effort is normal. No respiratory distress.     Breath sounds: Wheezing present.  Abdominal:     Palpations: Abdomen is soft.     Tenderness: There is no abdominal tenderness.  Musculoskeletal:        General: No swelling.     Cervical back: Neck supple.  Skin:    General: Skin is warm and dry.     Capillary Refill: Capillary refill takes less than 2 seconds.  Neurological:     Mental Status: He is alert.  Psychiatric:        Mood and Affect: Mood normal.    ED Results and Treatments Labs (all labs  ordered are listed, but only abnormal results are displayed) Labs Reviewed  COMPREHENSIVE METABOLIC PANEL - Abnormal; Notable for the following components:      Result Value   Potassium 3.4 (*)    Glucose, Bld 108 (*)    BUN 25 (*)    Creatinine, Ser 1.31 (*)    GFR, Estimated 54 (*)    All other components within normal limits  BLOOD GAS, VENOUS - Abnormal; Notable for the following components:   Bicarbonate 28.4 (*)    Acid-Base Excess 6.1 (*)    All other components within normal limits  RESP PANEL BY RT-PCR (FLU A&B, COVID) ARPGX2  CBC  COMPREHENSIVE METABOLIC PANEL  CBC  APTT  MAGNESIUM  PHOSPHORUS  TROPONIN I (HIGH SENSITIVITY)  TROPONIN I (HIGH SENSITIVITY)  Radiology DG Chest 2 View  Result Date: 11/20/2021 CLINICAL DATA:  Shortness of Breath EXAM: CHEST - 2 VIEW COMPARISON:  Chest x-ray 04/17/2019. FINDINGS: The heart and mediastinal contours are unchanged.  Aortic calcific No focal consolidation. No pulmonary edema. No pleural effusion. No pneumothorax. No acute osseous abnormality. Multilevel degenerative changes spine. IMPRESSION: No active cardiopulmonary disease. Electronically Signed   By: Iven Finn M.D.   On: 11/20/2021 17:35    Pertinent labs & imaging results that were available during my care of the patient were reviewed by me and considered in my medical decision making (see MDM for details).  Medications Ordered in ED Medications  mometasone-formoterol (DULERA) 200-5 MCG/ACT inhaler 2 puff (2 puffs Inhalation Not Given 11/20/21 2338)  ipratropium-albuterol (DUONEB) 0.5-2.5 (3) MG/3ML nebulizer solution 3 mL (has no administration in time range)  enoxaparin (LOVENOX) injection 40 mg (has no administration in time range)  finasteride (PROSCAR) tablet 5 mg (has no administration in time range)  tamsulosin (FLOMAX) capsule 0.4 mg (has no  administration in time range)  potassium chloride (KLOR-CON M) CR tablet 10 mEq (has no administration in time range)  amiodarone (PACERONE) tablet 200 mg (has no administration in time range)  carvedilol (COREG) tablet 3.125 mg (has no administration in time range)  mirabegron ER (MYRBETRIQ) tablet 50 mg (has no administration in time range)  rOPINIRole (REQUIP) tablet 2 mg (has no administration in time range)  ipratropium-albuterol (DUONEB) 0.5-2.5 (3) MG/3ML nebulizer solution 3 mL (3 mLs Nebulization Given 11/20/21 2255)  dextromethorphan-guaiFENesin (MUCINEX DM) 30-600 MG per 12 hr tablet 1 tablet (1 tablet Oral Given 11/20/21 2259)  methylPREDNISolone sodium succinate (SOLU-MEDROL) 125 mg/2 mL injection 80 mg (has no administration in time range)  azithromycin (ZITHROMAX) tablet 500 mg (500 mg Oral Given 11/20/21 2300)    Followed by  azithromycin (ZITHROMAX) tablet 250 mg (has no administration in time range)  pantoprazole (PROTONIX) EC tablet 40 mg (has no administration in time range)  ipratropium-albuterol (DUONEB) 0.5-2.5 (3) MG/3ML nebulizer solution 9 mL (9 mLs Nebulization Given 11/20/21 1830)  methylPREDNISolone sodium succinate (SOLU-MEDROL) 125 mg/2 mL injection 125 mg (125 mg Intravenous Given 11/20/21 1752)  albuterol (PROVENTIL) (2.5 MG/3ML) 0.083% nebulizer solution (7.5 mg  Given 11/20/21 1937)                                                                                                                                     Procedures .Critical Care Performed by: Teressa Lower, MD Authorized by: Teressa Lower, MD   Critical care provider statement:    Critical care time (minutes):  30   Critical care was necessary to treat or prevent imminent or life-threatening deterioration of the following conditions:  Respiratory failure   Critical care was time spent personally by me on the following activities:  Development of treatment plan with patient or surrogate, discussions  with consultants, evaluation of patient's response to treatment, examination of  patient, ordering and review of laboratory studies, ordering and review of radiographic studies, ordering and performing treatments and interventions, pulse oximetry, re-evaluation of patient's condition and review of old charts  (including critical care time)  Medical Decision Making / ED Course   This patient presents to the ED for concern of COPD exacerbation, this involves an extensive number of treatment options, and is a complaint that carries with it a high risk of complications and morbidity.  The differential diagnosis includes COPD exacerbation, pneumonia, ACS, PE  MDM: She is in the emergency department for evaluation of shortness of breath and wheezing.  Physical exam reveals patient with accessory muscle use, significant end expiratory wheezing.  Chest x-ray unremarkable.  Laboratory evaluation unremarkable outside of mild hypokalemia to 3.4, creatinine 1.31 which is near the patient's baseline.  Troponin is negative.  COVID and flu negative.  Patient given 3 back-to-back DuoNeb's and methylprednisolone but on reevaluation patient has persistent wheezing and accessory muscle use.  Patient given an hour-long treatment of albuterol and on reevaluation wheezing trending towards improvement.  Patient is not safe for discharge at this time and require every 4 albuterol's in the hospital.  Patient was then admitted to the hospital service for further management of his shortness of breath.   Additional history obtained: -Additional history obtained from daughter -External records from outside source obtained and reviewed including: Chart review including previous notes, labs, imaging, consultation notes   Lab Tests: -I ordered, reviewed, and interpreted labs.   The pertinent results include:   Labs Reviewed  COMPREHENSIVE METABOLIC PANEL - Abnormal; Notable for the following components:      Result Value    Potassium 3.4 (*)    Glucose, Bld 108 (*)    BUN 25 (*)    Creatinine, Ser 1.31 (*)    GFR, Estimated 54 (*)    All other components within normal limits  BLOOD GAS, VENOUS - Abnormal; Notable for the following components:   Bicarbonate 28.4 (*)    Acid-Base Excess 6.1 (*)    All other components within normal limits  RESP PANEL BY RT-PCR (FLU A&B, COVID) ARPGX2  CBC  COMPREHENSIVE METABOLIC PANEL  CBC  APTT  MAGNESIUM  PHOSPHORUS  TROPONIN I (HIGH SENSITIVITY)  TROPONIN I (HIGH SENSITIVITY)      EKG   EKG Interpretation  Date/Time:  Friday November 20 2021 17:09:19 EST Ventricular Rate:  64 PR Interval:  326 QRS Duration: 116 QT Interval:  468 QTC Calculation: 482 R Axis:   72 Text Interpretation: Sinus rhythm with 1st degree A-V block Low voltage QRS Borderline ECG When compared with ECG of 24-Feb-2019 05:13, PREVIOUS ECG IS PRESENT Confirmed by [place] ([id number]) on 11/20/2021 11:41:47 PM         Imaging Studies ordered: I ordered imaging studies including CXR I independently visualized and interpreted imaging. I agree with the radiologist interpretation   Medicines ordered and prescription drug management: Meds ordered this encounter  Medications   ipratropium-albuterol (DUONEB) 0.5-2.5 (3) MG/3ML nebulizer solution 9 mL   methylPREDNISolone sodium succinate (SOLU-MEDROL) 125 mg/2 mL injection 125 mg   DISCONTD: albuterol (PROVENTIL,VENTOLIN) solution continuous neb   albuterol (PROVENTIL) (2.5 MG/3ML) 0.083% nebulizer solution    Hali Marry F: cabinet override   mometasone-formoterol (DULERA) 200-5 MCG/ACT inhaler 2 puff   ipratropium-albuterol (DUONEB) 0.5-2.5 (3) MG/3ML nebulizer solution 3 mL   enoxaparin (LOVENOX) injection 40 mg   finasteride (PROSCAR) tablet 5 mg   tamsulosin (FLOMAX) capsule 0.4 mg  TAKE (1) CAPSULE BY MOUTH TWICE DAILY.     potassium chloride (KLOR-CON M) CR tablet 10 mEq   DISCONTD: Mucinex DM 30-600 MG TB12 1 tablet    amiodarone (PACERONE) tablet 200 mg   carvedilol (COREG) tablet 3.125 mg    TAKE (1) TABLET BY MOUTH TWICE A DAY WITH MEALS (BREAKFAST AND SUPPER)     mirabegron ER (MYRBETRIQ) tablet 50 mg   rOPINIRole (REQUIP) tablet 2 mg   ipratropium-albuterol (DUONEB) 0.5-2.5 (3) MG/3ML nebulizer solution 3 mL   dextromethorphan-guaiFENesin (MUCINEX DM) 30-600 MG per 12 hr tablet 1 tablet   methylPREDNISolone sodium succinate (SOLU-MEDROL) 125 mg/2 mL injection 80 mg   FOLLOWED BY Linked Order Group    azithromycin (ZITHROMAX) tablet 500 mg    azithromycin (ZITHROMAX) tablet 250 mg   pantoprazole (PROTONIX) EC tablet 40 mg    -I have reviewed the patients home medicines and have made adjustments as needed  Critical interventions Multiple duo nebs, multiple reevaluations, steroids    Cardiac Monitoring: The patient was maintained on a cardiac monitor.  I personally viewed and interpreted the cardiac monitored which showed an underlying rhythm of: NSR  Social Determinants of Health:  Factors impacting patients care include: none   Reevaluation: After the interventions noted above, I reevaluated the patient and found that they have :improved  Co morbidities that complicate the patient evaluation  Past Medical History:  Diagnosis Date   Arthritis    Balance problem    Uses a walker   Blood in urine    Sees urology yearly   Deafness in right ear age 73   Hyperlipidemia    Hypertension    PSVT (paroxysmal supraventricular tachycardia) ([place])    Temporal arteritis ([place]) 2016   Urinary incontinence       Dispostion: I considered admission for this patient, and due to persistent wheezing despite multiple duo nebs patient will be admitted     Final Clinical Impression(s) / ED Diagnoses Final diagnoses:  None     @PCDICTATION @    Teressa Lower, MD 11/20/21 2342

## 2021-11-21 DIAGNOSIS — J441 Chronic obstructive pulmonary disease with (acute) exacerbation: Secondary | ICD-10-CM | POA: Diagnosis not present

## 2021-11-21 LAB — MAGNESIUM: Magnesium: 2.1 mg/dL (ref 1.7–2.4)

## 2021-11-21 LAB — COMPREHENSIVE METABOLIC PANEL
ALT: 23 U/L (ref 0–44)
AST: 18 U/L (ref 15–41)
Albumin: 3.5 g/dL (ref 3.5–5.0)
Alkaline Phosphatase: 73 U/L (ref 38–126)
Anion gap: 7 (ref 5–15)
BUN: 27 mg/dL — ABNORMAL HIGH (ref 8–23)
CO2: 29 mmol/L (ref 22–32)
Calcium: 9 mg/dL (ref 8.9–10.3)
Chloride: 102 mmol/L (ref 98–111)
Creatinine, Ser: 1.35 mg/dL — ABNORMAL HIGH (ref 0.61–1.24)
GFR, Estimated: 52 mL/min — ABNORMAL LOW (ref >60)
Glucose, Bld: 154 mg/dL — ABNORMAL HIGH (ref 70–99)
Potassium: 3.2 mmol/L — ABNORMAL LOW (ref 3.5–5.1)
Sodium: 138 mmol/L (ref 135–145)
Total Bilirubin: 0.3 mg/dL (ref 0.3–1.2)
Total Protein: 6.8 g/dL (ref 6.5–8.1)

## 2021-11-21 LAB — CBC
HCT: 45.7 % (ref 39.0–52.0)
Hemoglobin: 15.1 g/dL (ref 13.0–17.0)
MCH: 32.5 pg (ref 26.0–34.0)
MCHC: 33 g/dL (ref 30.0–36.0)
MCV: 98.3 fL (ref 80.0–100.0)
Platelets: 160 10*3/uL (ref 150–400)
RBC: 4.65 MIL/uL (ref 4.22–5.81)
RDW: 14.2 % (ref 11.5–15.5)
WBC: 5.5 10*3/uL (ref 4.0–10.5)
nRBC: 0 % (ref 0.0–0.2)

## 2021-11-21 LAB — APTT: aPTT: 32 seconds (ref 24–36)

## 2021-11-21 LAB — PHOSPHORUS: Phosphorus: 3.3 mg/dL (ref 2.5–4.6)

## 2021-11-21 MED ORDER — OXYMETAZOLINE HCL 0.05 % NA SOLN
1.0000 | Freq: Two times a day (BID) | NASAL | Status: DC | PRN
  Administered 2021-11-21: 1 via NASAL
  Filled 2021-11-21: qty 30

## 2021-11-21 NOTE — Care Management Obs Status (Signed)
[facility] NOTIFICATION   Patient Details  Name: Gerald Hall MRN: [id number] Date of Birth: [birth year]   Medicare Observation Status Notification Given:  Yes    Iona Beard, [place] 11/21/2021, 10:54 AM

## 2021-11-21 NOTE — Progress Notes (Signed)
HPI: Gerald Hall is a 87 y.o. male with medical history significant for hypertension, COPD, BPH who presents to the emergency department due to 5 to 6 days of increasing shortness of breath associated with cough and runny nose which worsened within the last 2 days, he followed up with his PCP (Dr. Wolfgang Phoenix) today and was told to go to the ED for chest x-ray and further work-up.  He complains of left-sided chest soreness when he coughs, but denies fever, chills, nausea, vomiting, headache, blurry vision, numbness or tingling  ED Course:  In the emergency department, he was tachypneic, BP was 161/81 and other vital signs were within normal range.  Work-up in the ED showed normal CBC, BMP showed hypokalemia, BUNs/creatinine at 25/1.31 (creatinine is within baseline range).,  Troponin x1 was negative. Chest x-ray showed no active cardiopulmonary disease Breathing treatment was provided.  Hospitalist was asked to admit.  For further evaluation and management.  Subjective Patient states he is feeling better and wants to go home.  He still appears short of breath and tachypneic and is on 3 L of oxygen.  Physical Exam: BP 137/74 (BP Location: Left Arm)    Pulse 80    Temp 97.9 F (36.6 C)    Resp (!) 22    Ht 5\' 8"  (1.727 m)    Wt 88 kg    SpO2 97%    BMI 29.48 kg/m   General: 87 y.o. year-old male well developed well nourished in no acute distress.  Alert and oriented x3. HEENT: NCAT, EOMI Neck: Supple, trachea medial Cardiovascular: Regular rate and rhythm with no rubs or gallops.  No thyromegaly or JVD noted.  No lower extremity edema. 2/4 pulses in all 4 extremities. Respiratory: Tachypnea.  Mild wheezing in all lobes on auscultation.   Abdomen: Soft, nontender nondistended with normal bowel sounds x4 quadrants. Muskuloskeletal: No cyanosis, clubbing or edema noted bilaterally Neuro: CN II-XII intact, strength 5/5 x 4, sensation, reflexes intact Skin: No ulcerative lesions noted or  rashes Psychiatry: Mood is appropriate for condition and setting          Labs on Admission:  Basic Metabolic Panel: Recent Labs  Lab 11/20/21 1742 11/21/21 0445  NA 137 138  K 3.4* 3.2*  CL 99 102  CO2 29 29  GLUCOSE 108* 154*  BUN 25* 27*  CREATININE 1.31* 1.35*  CALCIUM 9.0 9.0  MG  --  2.1  PHOS  --  3.3   Liver Function Tests: Recent Labs  Lab 11/20/21 1742 11/21/21 0445  AST 20 18  ALT 25 23  ALKPHOS 86 73  BILITOT 0.3 0.3  PROT 6.9 6.8  ALBUMIN 3.6 3.5   No results for input(s): LIPASE, AMYLASE in the last 168 hours. No results for input(s): AMMONIA in the last 168 hours. CBC: Recent Labs  Lab 11/20/21 1734 11/21/21 0445  WBC 6.9 5.5  HGB 15.2 15.1  HCT 47.2 45.7  MCV 96.7 98.3  PLT 163 160   Cardiac Enzymes: No results for input(s): CKTOTAL, CKMB, CKMBINDEX, TROPONINI in the last 168 hours.  BNP (last 3 results) No results for input(s): BNP in the last 8760 hours.  ProBNP (last 3 results) No results for input(s): PROBNP in the last 8760 hours.  CBG: No results for input(s): GLUCAP in the last 168 hours.  Radiological Exams on Admission: DG Chest 2 View  Result Date: 11/20/2021 CLINICAL DATA:  Shortness of Breath EXAM: CHEST - 2 VIEW COMPARISON:  Chest x-ray 04/17/2019.  FINDINGS: The heart and mediastinal contours are unchanged.  Aortic calcific No focal consolidation. No pulmonary edema. No pleural effusion. No pneumothorax. No acute osseous abnormality. Multilevel degenerative changes spine. IMPRESSION: No active cardiopulmonary disease. Electronically Signed   By: Iven Finn M.D.   On: 11/20/2021 17:35    EKG: I independently viewed the EKG done and my findings are as followed: Normal sinus rhythm at a rate of 64 bpm with first-degree AV block  Assessment/Plan Present on Admission:  COPD with acute exacerbation ([place])  Principal Problem:   COPD with acute exacerbation ([place]) Active Problems:   Hypokalemia  Acute exacerbation of  COPD with acute hypoxic respiratory failure Initial O2 sats on room air less than 88% Continue duo nebs, Mucinex, Solu-Medrol, azithromycin. Continue Protonix to prevent steroid-induced ulcer Continue incentive spirometry and flutter valve Overall improving do not think he is ready to go home today. Wean oxygen down to room air as tolerates, not on home oxygen normally Still smoking Out of bed  Essential hypertension Continue Coreg  Chronic atrial fibrillation Continue Coreg and amiodarone  BPH Continue Proscar and Flomax  RLS Continue Requip  Overactive bladder Continue Myrbetriq  Tobacco abuse Patient counseled on tobacco abuse cessation   DVT prophylaxis: Lovenox  Code Status: Full code  Family Communication: Daughter at bedside  Discharge disposition pending on oxygen wean and being able ambulate without significant shortness of breath.  Lives alone.  Already has home health PT at home previous to admission.  Ambulate.  Daughter updated at bedside.   Layla Gramm A MD [facility]  11/21/2021, 9:02 AM

## 2021-11-21 NOTE — Progress Notes (Signed)
Q4 PRN Nebulizer treatment was requested,  at approximately 1600, last administration was at 1412. Patient was reclined in bed, SpO2 96% on 2L/m [place], RR 23, HR 80. Family mentioned he has had several vistitors, a meal and seem SOB. Patient appeared to be comfortable and agreeable to having his next treatment within the ordered scheduled administration window. RN was notified.

## 2021-11-21 NOTE — TOC Initial Note (Signed)
Transition of Care [facility]) - Initial/Assessment Note    Patient Details  Name: Gerald Hall MRN: [id number] Date of Birth: [birth year]  Transition of Care [facility]) CM/SW Contact:    Iona Beard, [place] Phone Number: 11/21/2021, 12:44 PM  Clinical Narrative:                 TOC made aware that pt has [place] services at home. CSW spoke with pt about this. Pt states that his PCP set up [facility] for him but he has been waiting for the agency to call him to make the appointment for 2 weeks. Pt states he is not sure what company he was set up with. CSW to reach out to local [facility] agencies to see who got his referral. Pt will need resumption orders prior to D/C.   Expected Discharge Plan: [place] Barriers to Discharge: Continued Medical Work up   Patient Goals and CMS Choice Patient states their goals for this hospitalization and ongoing recovery are:: Home with [facility] CMS Medicare.gov Compare Post Acute Care list provided to:: Patient Choice offered to / list presented to : Patient  Expected Discharge Plan and Services Expected Discharge Plan: [place] In-house Referral: Clinical Social Work Discharge Planning Services: CM Consult Post Acute Care Choice: [place] arrangements for the past 2 months: Single Family Home                                      Prior Living Arrangements/Services Living arrangements for the past 2 months: Single Family Home Lives with:: Spouse Patient language and need for interpreter reviewed:: Yes Do you feel safe going back to the place where you live?: Yes      Need for Family Participation in Patient Care: Yes (Comment) Care giver support system in place?: Yes (comment) Current home services: DME Criminal Activity/Legal Involvement Pertinent to Current Situation/Hospitalization: No - Comment as needed  Activities of Daily Living Home Assistive Devices/Equipment: [place] Rile (specify type) ADL Screening (condition  at time of admission) Patient's cognitive ability adequate to safely complete daily activities?: Yes Is the patient deaf or have difficulty hearing?: No Does the patient have difficulty seeing, even when wearing glasses/contacts?: No Does the patient have difficulty concentrating, remembering, or making decisions?: No Patient able to express need for assistance with ADLs?: Yes Does the patient have difficulty dressing or bathing?: No Independently performs ADLs?: Yes (appropriate for developmental age) Does the patient have difficulty walking or climbing stairs?: Yes Weakness of Legs: Both Weakness of Arms/Hands: None  Permission Sought/Granted                  Emotional Assessment Appearance:: Appears stated age Attitude/Demeanor/Rapport: Engaged Affect (typically observed): Accepting Orientation: : Oriented to Self, Oriented to Place, Oriented to  Time, Oriented to Situation Alcohol / Substance Use: Not Applicable Psych Involvement: No (comment)  Admission diagnosis:  COPD with acute exacerbation ([place]) [J44.1] Patient Active Problem List   Diagnosis Date Noted   COPD with acute exacerbation ([place]) 11/20/2021   Hypokalemia 11/20/2021   Acute left-sided low back pain without sciatica 10/15/2021   Centrilobular emphysema ([place]) 11/12/2020   Secondary cardiomyopathy ([place]) 11/12/2020   Subacute thyroiditis 09/25/2020   Primary osteoarthritis involving multiple joints 02/21/2017   Smoker 02/21/2017   AMD (age-related macular degeneration), bilateral 11/10/2016   Insomnia 07/20/2016   Restless legs 07/20/2016  Spinal stenosis in cervical region 10/14/2015   Senile purpura ([place]) 04/18/2015   Osteopenia 01/28/2015   Spinal stenosis of lumbar region 01/20/2015   BPH (benign prostatic hyperplasia) 01/20/2015   HTN (hypertension), benign 04/17/2014   Lumbar disc disease 03/05/2014   PCP:  Kathyrn Drown, MD Pharmacy:   [place], [place] - [address] [place] [address] Phone: [phone] Fax: [place], [place] [address] [place] [address] Phone: [phone] Fax: [phone]     Social Determinants of Health (SDOH) Interventions    Readmission Risk Interventions No flowsheet data found.

## 2021-11-21 NOTE — Progress Notes (Signed)
Dulera placed in room

## 2021-11-22 DIAGNOSIS — Z7982 Long term (current) use of aspirin: Secondary | ICD-10-CM | POA: Diagnosis not present

## 2021-11-22 DIAGNOSIS — G2581 Restless legs syndrome: Secondary | ICD-10-CM | POA: Diagnosis present

## 2021-11-22 DIAGNOSIS — Z79899 Other long term (current) drug therapy: Secondary | ICD-10-CM | POA: Diagnosis not present

## 2021-11-22 DIAGNOSIS — Z96641 Presence of right artificial hip joint: Secondary | ICD-10-CM | POA: Diagnosis present

## 2021-11-22 DIAGNOSIS — J441 Chronic obstructive pulmonary disease with (acute) exacerbation: Secondary | ICD-10-CM | POA: Diagnosis present

## 2021-11-22 DIAGNOSIS — Z716 Tobacco abuse counseling: Secondary | ICD-10-CM | POA: Diagnosis not present

## 2021-11-22 DIAGNOSIS — J449 Chronic obstructive pulmonary disease, unspecified: Secondary | ICD-10-CM | POA: Diagnosis present

## 2021-11-22 DIAGNOSIS — M1990 Unspecified osteoarthritis, unspecified site: Secondary | ICD-10-CM | POA: Diagnosis present

## 2021-11-22 DIAGNOSIS — E876 Hypokalemia: Secondary | ICD-10-CM | POA: Diagnosis not present

## 2021-11-22 DIAGNOSIS — Z20822 Contact with and (suspected) exposure to covid-19: Secondary | ICD-10-CM | POA: Diagnosis present

## 2021-11-22 DIAGNOSIS — I10 Essential (primary) hypertension: Secondary | ICD-10-CM | POA: Diagnosis present

## 2021-11-22 DIAGNOSIS — N3281 Overactive bladder: Secondary | ICD-10-CM | POA: Diagnosis present

## 2021-11-22 DIAGNOSIS — Z7951 Long term (current) use of inhaled steroids: Secondary | ICD-10-CM | POA: Diagnosis not present

## 2021-11-22 DIAGNOSIS — H9191 Unspecified hearing loss, right ear: Secondary | ICD-10-CM | POA: Diagnosis present

## 2021-11-22 DIAGNOSIS — E785 Hyperlipidemia, unspecified: Secondary | ICD-10-CM | POA: Diagnosis present

## 2021-11-22 DIAGNOSIS — I428 Other cardiomyopathies: Secondary | ICD-10-CM | POA: Diagnosis present

## 2021-11-22 DIAGNOSIS — I482 Chronic atrial fibrillation, unspecified: Secondary | ICD-10-CM | POA: Diagnosis present

## 2021-11-22 DIAGNOSIS — N400 Enlarged prostate without lower urinary tract symptoms: Secondary | ICD-10-CM | POA: Diagnosis present

## 2021-11-22 DIAGNOSIS — F17210 Nicotine dependence, cigarettes, uncomplicated: Secondary | ICD-10-CM | POA: Diagnosis present

## 2021-11-22 DIAGNOSIS — Z7952 Long term (current) use of systemic steroids: Secondary | ICD-10-CM | POA: Diagnosis not present

## 2021-11-22 DIAGNOSIS — J9601 Acute respiratory failure with hypoxia: Secondary | ICD-10-CM | POA: Diagnosis present

## 2021-11-22 DIAGNOSIS — Z79891 Long term (current) use of opiate analgesic: Secondary | ICD-10-CM | POA: Diagnosis not present

## 2021-11-22 MED ORDER — ROPINIROLE HCL 1 MG PO TABS
2.0000 mg | ORAL_TABLET | Freq: Once | ORAL | Status: AC
  Administered 2021-11-22: 2 mg via ORAL
  Filled 2021-11-22: qty 2

## 2021-11-22 MED ORDER — TRAZODONE HCL 50 MG PO TABS
25.0000 mg | ORAL_TABLET | Freq: Every evening | ORAL | Status: DC | PRN
  Administered 2021-11-22: 25 mg via ORAL
  Filled 2021-11-22: qty 1

## 2021-11-22 MED ORDER — SALINE SPRAY 0.65 % NA SOLN: 1.0000 | NASAL | Status: DC | PRN

## 2021-11-22 MED ORDER — PREDNISONE 20 MG PO TABS
50.0000 mg | ORAL_TABLET | Freq: Every day | ORAL | Status: DC
  Administered 2021-11-23: 50 mg via ORAL
  Filled 2021-11-22: qty 1

## 2021-11-22 MED ORDER — VITAMIN D 25 MCG (1000 UNIT) PO TABS
1000.0000 [IU] | ORAL_TABLET | Freq: Every day | ORAL | Status: DC
  Administered 2021-11-22 – 2021-11-23 (×2): 1000 [IU] via ORAL
  Filled 2021-11-22 (×2): qty 1

## 2021-11-22 MED ORDER — ALBUTEROL SULFATE (2.5 MG/3ML) 0.083% IN NEBU
2.5000 mg | INHALATION_SOLUTION | RESPIRATORY_TRACT | Status: DC | PRN
  Administered 2021-11-22 – 2021-11-23 (×2): 2.5 mg via RESPIRATORY_TRACT
  Filled 2021-11-22 (×3): qty 3

## 2021-11-22 MED ORDER — BUDESONIDE 0.25 MG/2ML IN SUSP
0.2500 mg | Freq: Two times a day (BID) | RESPIRATORY_TRACT | Status: DC
  Administered 2021-11-22 – 2021-11-23 (×3): 0.25 mg via RESPIRATORY_TRACT
  Filled 2021-11-22 (×3): qty 2

## 2021-11-22 MED ORDER — FLUTICASONE PROPIONATE 50 MCG/ACT NA SUSP
2.0000 | Freq: Every day | NASAL | Status: DC
  Administered 2021-11-22: 2 via NASAL
  Filled 2021-11-22: qty 16

## 2021-11-22 MED ORDER — ASPIRIN EC 325 MG PO TBEC
325.0000 mg | DELAYED_RELEASE_TABLET | Freq: Every day | ORAL | Status: DC
  Administered 2021-11-22 – 2021-11-23 (×2): 325 mg via ORAL
  Filled 2021-11-22 (×2): qty 1

## 2021-11-22 NOTE — Plan of Care (Signed)

## 2021-11-22 NOTE — Progress Notes (Signed)
Patient resting well per family, no PRN nebulizer needed at this time.

## 2021-11-22 NOTE — TOC Progression Note (Signed)
Transition of Care [facility]) - Progression Note    Patient Details  Name: Gerald Hall MRN: [id number] Date of Birth: 1935/06/02  Transition of Care [facility]) CM/SW [place], [place] Phone Number: 11/22/2021, 11:39 AM  Clinical Narrative:    Pt in need of home O2 to be set up prior to dc. CSW spoke with pt who states that he would like to use [employer]. CSW reached out to [place] and they are no closed. Their hours on Sunday are 9-11. TOC to set up home O2 and nebulizer tomorrow.   Expected Discharge Plan: [place] Barriers to Discharge: Continued Medical Work up  Expected Discharge Plan and Services Expected Discharge Plan: [place] In-house Referral: Clinical Social Work Discharge Planning Services: CM Consult Post Acute Care Choice: [place] arrangements for the past 2 months: Single Family Home                                       Social Determinants of Health (SDOH) Interventions    Readmission Risk Interventions No flowsheet data found.

## 2021-11-22 NOTE — Progress Notes (Signed)
SATURATION QUALIFICATIONS: (This note is used to comply with regulatory documentation for home oxygen)  Patient Saturations on Room Air at Rest = 94%  Patient Saturations on Room Air while Ambulating = 87%  Patient Saturations on 2 Liters of oxygen while Ambulating = 95%  Please briefly explain why patient needs home oxygen: patient was SOB with ambulation and O2 dropped to 87%.

## 2021-11-22 NOTE — Progress Notes (Signed)
HPI: Gerald Hall is a 87 y.o. male with medical history significant for hypertension, COPD, BPH who presents to the emergency department due to 5 to 6 days of increasing shortness of breath associated with cough and runny nose which worsened within the last 2 days, he followed up with his PCP (Dr. Wolfgang Phoenix) today and was told to go to the ED for chest x-ray and further work-up.  Slow to improve-- still wheezing and dyspneic with speaking/movement  Subjective + cough, does not plan to stop smoking  Physical Exam: BP (!) 166/83    Pulse 77    Temp 98 F (36.7 C) (Oral)    Resp 19    Ht 5\' 8"  (1.727 m)    Wt 88 kg    SpO2 96%    BMI 29.48 kg/m    General: Appearance:     Overweight male in no acute distress     Lungs:     Mild increase in work of breathing with speaking, diffuse wheezing  Heart:    Normal heart rate.   MS:   All extremities are intact.    Neurologic:   Awake, alert, slightly hard of hearing             Labs on Admission:  Basic Metabolic Panel: Recent Labs  Lab 11/20/21 1742 11/21/21 0445  NA 137 138  K 3.4* 3.2*  CL 99 102  CO2 29 29  GLUCOSE 108* 154*  BUN 25* 27*  CREATININE 1.31* 1.35*  CALCIUM 9.0 9.0  MG  --  2.1  PHOS  --  3.3   Liver Function Tests: Recent Labs  Lab 11/20/21 1742 11/21/21 0445  AST 20 18  ALT 25 23  ALKPHOS 86 73  BILITOT 0.3 0.3  PROT 6.9 6.8  ALBUMIN 3.6 3.5   No results for input(s): LIPASE, AMYLASE in the last 168 hours. No results for input(s): AMMONIA in the last 168 hours. CBC: Recent Labs  Lab 11/20/21 1734 11/21/21 0445  WBC 6.9 5.5  HGB 15.2 15.1  HCT 47.2 45.7  MCV 96.7 98.3  PLT 163 160   Cardiac Enzymes: No results for input(s): CKTOTAL, CKMB, CKMBINDEX, TROPONINI in the last 168 hours.  BNP (last 3 results) No results for input(s): BNP in the last 8760 hours.  ProBNP (last 3 results) No results for input(s): PROBNP in the last 8760 hours.  CBG: No results for input(s): GLUCAP in the  last 168 hours.  Radiological Exams on Admission: DG Chest 2 View  Result Date: 11/20/2021 CLINICAL DATA:  Shortness of Breath EXAM: CHEST - 2 VIEW COMPARISON:  Chest x-ray 04/17/2019. FINDINGS: The heart and mediastinal contours are unchanged.  Aortic calcific No focal consolidation. No pulmonary edema. No pleural effusion. No pneumothorax. No acute osseous abnormality. Multilevel degenerative changes spine. IMPRESSION: No active cardiopulmonary disease. Electronically Signed   By: Iven Finn M.D.   On: 11/20/2021 17:35     Assessment/Plan Present on Admission:  COPD with acute exacerbation ([place])  COPD with exacerbation (H[facility])  Principal Problem:   COPD with acute exacerbation ([place]) Active Problems:   Hypokalemia   COPD with exacerbation (HCC)  Acute exacerbation of COPD with acute hypoxic respiratory failure Initial O2 sats on room air less than 88%- still drops with ambulation Continue duonebs scheduled, Mucinex, Solu-Medrol- wean to prednisone in the AM -trial of inhaled steroids Continue Protonix to prevent steroid-induced ulcer Continue incentive spirometry and flutter valve- aggressive pulmonary toilet and ambulation Wean oxygen down  to room air as tolerates, not on home oxygen normally -bnp in AM -suspect home in AM  Hypokalemia -repleted  Essential hypertension Continue Coreg  Chronic atrial fibrillation Continue Coreg and amiodarone  BPH Continue Proscar and Flomax  RLS Continue Requip  Overactive bladder Continue Myrbetriq  Tobacco abuse Patient counseled on tobacco abuse cessation -does not appear this will happen   DVT prophylaxis: Lovenox  Code Status: Full code  Family Communication: Daughter at bedside  Discharge disposition; in the AM with O2 and a nebulizer   Geradine Girt DO [facility]  11/22/2021, 12:40 PM

## 2021-11-23 ENCOUNTER — Telehealth: Payer: Self-pay

## 2021-11-23 LAB — BASIC METABOLIC PANEL
Anion gap: 7 (ref 5–15)
BUN: 36 mg/dL — ABNORMAL HIGH (ref 8–23)
CO2: 29 mmol/L (ref 22–32)
Calcium: 8.9 mg/dL (ref 8.9–10.3)
Chloride: 104 mmol/L (ref 98–111)
Creatinine, Ser: 1.36 mg/dL — ABNORMAL HIGH (ref 0.61–1.24)
GFR, Estimated: 51 mL/min — ABNORMAL LOW (ref >60)
Glucose, Bld: 113 mg/dL — ABNORMAL HIGH (ref 70–99)
Potassium: 4.1 mmol/L (ref 3.5–5.1)
Sodium: 140 mmol/L (ref 135–145)

## 2021-11-23 LAB — BRAIN NATRIURETIC PEPTIDE: B Natriuretic Peptide: 384 pg/mL — ABNORMAL HIGH (ref 0.0–100.0)

## 2021-11-23 MED ORDER — SALINE SPRAY 0.65 % NA SOLN: 1.0000 | NASAL | 0 refills | Status: DC | PRN

## 2021-11-23 MED ORDER — PANTOPRAZOLE SODIUM 40 MG PO TBEC: 40.0000 mg | DELAYED_RELEASE_TABLET | Freq: Every day | ORAL | 0 refills | Status: DC

## 2021-11-23 MED ORDER — FUROSEMIDE 10 MG/ML IJ SOLN
20.0000 mg | Freq: Once | INTRAMUSCULAR | Status: AC
  Administered 2021-11-23: 20 mg via INTRAVENOUS
  Filled 2021-11-23: qty 2

## 2021-11-23 MED ORDER — IPRATROPIUM-ALBUTEROL 0.5-2.5 (3) MG/3ML IN SOLN: 3.0000 mL | Freq: Four times a day (QID) | RESPIRATORY_TRACT | 0 refills | Status: DC

## 2021-11-23 NOTE — Discharge Summary (Signed)
Physician Discharge Summary  IZYAN EZZELL GLO:[id number] DOB: [birth year] DOA: 11/20/2021  PCP: Kathyrn Drown, MD  Admit date: 11/20/2021 Discharge date: 11/23/2021  Admitted From: home Discharge disposition: home   Recommendations for Outpatient Follow-Up:   Dme: O2 and nebulizer for this acute exacerbation-- maybe able to wean off (only dropped while ambulating) Would like patient to be re-evaled later this week regarding his respiratory status   Discharge Diagnosis:   Principal Problem:   COPD with acute exacerbation ([place]) Active Problems:   Restless legs   Smoker   Hypokalemia   COPD with exacerbation ([place])    Discharge Condition: Improved.  Diet recommendation: Low sodium, heart healthy.  Carbohydrate-modified.  Regular.  Wound care: None.  Code status: Full.   History of Present Illness:   Gerald Hall is a 87 y.o. male with medical history significant for hypertension, COPD, BPH who presents to the emergency department due to 5 to 6 days of increasing shortness of breath associated with cough and runny nose which worsened within the last 2 days, he followed up with his PCP (Dr. Wolfgang Phoenix) today and was told to go to the ED for chest x-ray and further work-up.  He complains of left-sided chest soreness when he coughs, but denies fever, chills, nausea, vomiting, headache, blurry vision, numbness or tingling   Hospital Course by Problem:   Acute exacerbation of COPD with acute hypoxic respiratory failure Initial O2 sats on room air less than 88%- still drops with ambulation Continue duonebs scheduled, Mucinex, Solu-Medrol- wean to prednisone in the AM- PCP called in [place] and he has already picked up Continue Protonix to prevent steroid-induced ulcer Continue incentive spirometry and flutter valve- aggressive pulmonary toilet and ambulation -bnp slightly elevated-- given low dose IV lasix x 1 without improvement so doubt volume overloaded -finish abx  course already called in (doxy) -arranged nebulizer -qualified for home O2 while ambulating   Hypokalemia -repleted   Essential hypertension Continue Coreg   Chronic atrial fibrillation Continue Coreg and amiodarone  BPH Continue Proscar and Flomax   RLS Continue Requip  Overactive bladder Continue Myrbetriq  Tobacco abuse Patient counseled on tobacco abuse cessation -does not appear this will happen- but he will not smoke while wearing O2        Medical Consultants:      Discharge Exam:   Vitals:   11/23/21 0516 11/23/21 0744  BP: (!) 169/86   Pulse: 73   Resp: 20   Temp: 97.6 F (36.4 C)   SpO2: 94% 96%   Vitals:   11/22/21 1931 11/22/21 2100 11/23/21 0516 11/23/21 0744  BP:  (!) 177/90 (!) 169/86   Pulse: 77 73 73   Resp: (!) 22 19 20    Temp:  97.9 F (36.6 C) 97.6 F (36.4 C)   TempSrc:  Oral Oral   SpO2: 95% 96% 94% 96%  Weight:      Height:        General exam: Appears calm and comfortable.    The results of significant diagnostics from this hospitalization (including imaging, microbiology, ancillary and laboratory) are listed below for reference.     Procedures and Diagnostic Studies:   DG Chest 2 View  Result Date: 11/20/2021 CLINICAL DATA:  Shortness of Breath EXAM: CHEST - 2 VIEW COMPARISON:  Chest x-ray 04/17/2019. FINDINGS: The heart and mediastinal contours are unchanged.  Aortic calcific No focal consolidation. No pulmonary edema. No pleural effusion. No pneumothorax. No acute osseous abnormality. Multilevel  degenerative changes spine. IMPRESSION: No active cardiopulmonary disease. Electronically Signed   By: Iven Finn M.D.   On: 11/20/2021 17:35     Labs:   Basic Metabolic Panel: Recent Labs  Lab 11/20/21 1742 11/21/21 0445 11/23/21 0421  NA 137 138 140  K 3.4* 3.2* 4.1  CL 99 102 104  CO2 29 29 29   GLUCOSE 108* 154* 113*  BUN 25* 27* 36*  CREATININE 1.31* 1.35* 1.36*  CALCIUM 9.0 9.0 8.9  MG  --  2.1  --    PHOS  --  3.3  --    GFR Estimated Creatinine Clearance: 43.6 mL/min (A) (by C-G formula based on SCr of 1.36 mg/dL (H)). Liver Function Tests: Recent Labs  Lab 11/20/21 1742 11/21/21 0445  AST 20 18  ALT 25 23  ALKPHOS 86 73  BILITOT 0.3 0.3  PROT 6.9 6.8  ALBUMIN 3.6 3.5   No results for input(s): LIPASE, AMYLASE in the last 168 hours. No results for input(s): AMMONIA in the last 168 hours. Coagulation profile No results for input(s): INR, PROTIME in the last 168 hours.  CBC: Recent Labs  Lab 11/20/21 1734 11/21/21 0445  WBC 6.9 5.5  HGB 15.2 15.1  HCT 47.2 45.7  MCV 96.7 98.3  PLT 163 160   Cardiac Enzymes: No results for input(s): CKTOTAL, CKMB, CKMBINDEX, TROPONINI in the last 168 hours. BNP: Invalid input(s): POCBNP CBG: No results for input(s): GLUCAP in the last 168 hours. D-Dimer No results for input(s): DDIMER in the last 72 hours. Hgb A1c No results for input(s): HGBA1C in the last 72 hours. Lipid Profile No results for input(s): CHOL, HDL, LDLCALC, TRIG, CHOLHDL, LDLDIRECT in the last 72 hours. Thyroid function studies No results for input(s): TSH, T4TOTAL, T3FREE, THYROIDAB in the last 72 hours.  Invalid input(s): FREET3 Anemia work up No results for input(s): VITAMINB12, FOLATE, FERRITIN, TIBC, IRON, RETICCTPCT in the last 72 hours. Microbiology Recent Results (from the past 240 hour(s))  Resp Panel by RT-PCR (Flu A&B, Covid) Nasopharyngeal Swab     Status: None   Collection Time: 11/20/21  7:34 PM   Specimen: Nasopharyngeal Swab; Nasopharyngeal(NP) swabs in vial transport medium  Result Value Ref Range Status   SARS Coronavirus 2 by RT PCR NEGATIVE NEGATIVE Final    Comment: (NOTE) SARS-CoV-2 target nucleic acids are NOT DETECTED.  The SARS-CoV-2 RNA is generally detectable in upper respiratory specimens during the acute phase of infection. The lowest concentration of SARS-CoV-2 viral copies this assay can detect is 138 copies/mL. A  negative result does not preclude SARS-Cov-2 infection and should not be used as the sole basis for treatment or other patient management decisions. A negative result may occur with  improper specimen collection/handling, submission of specimen other than nasopharyngeal swab, presence of viral mutation(s) within the areas targeted by this assay, and inadequate number of viral copies(<138 copies/mL). A negative result must be combined with clinical observations, patient history, and epidemiological information. The expected result is Negative.  Fact Sheet for Patients:  EntrepreneurPulse.com.au  Fact Sheet for Healthcare Providers:  IncredibleEmployment.be  This test is no t yet approved or cleared by the [place] FDA and  has been authorized for detection and/or diagnosis of SARS-CoV-2 by FDA under an Emergency Use Authorization (EUA). This EUA will remain  in effect (meaning this test can be used) for the duration of the COVID-19 declaration under Section 564(b)(1) of the Act, 21 U.S.C.section 360bbb-3(b)(1), unless the authorization is terminated  or revoked sooner.  Influenza A by PCR NEGATIVE NEGATIVE Final   Influenza B by PCR NEGATIVE NEGATIVE Final    Comment: (NOTE) The Xpert Xpress SARS-CoV-2/FLU/RSV plus assay is intended as an aid in the diagnosis of influenza from Nasopharyngeal swab specimens and should not be used as a sole basis for treatment. Nasal washings and aspirates are unacceptable for Xpert Xpress SARS-CoV-2/FLU/RSV testing.  Fact Sheet for Patients: EntrepreneurPulse.com.au  Fact Sheet for Healthcare Providers: IncredibleEmployment.be  This test is not yet approved or cleared by the [place] FDA and has been authorized for detection and/or diagnosis of SARS-CoV-2 by FDA under an Emergency Use Authorization (EUA). This EUA will remain in effect (meaning this test can  be used) for the duration of the COVID-19 declaration under Section 564(b)(1) of the Act, 21 U.S.C. section 360bbb-3(b)(1), unless the authorization is terminated or revoked.  Performed at [facility], [address] [place], [place] [address]      Discharge Instructions:   Discharge Instructions     Diet - low sodium heart healthy   Complete by: As directed    Discharge instructions   Complete by: As directed    Oxygen is highly flammable do NOT smoke with using.  Ideally please stop smoking   Increase activity slowly   Complete by: As directed       Allergies as of 11/23/2021       Reactions   Cephalexin Diarrhea   And general weakness   Penicillins    GI UPSET         Medication List     STOP taking these medications    HYDROcodone-acetaminophen 5-325 MG tablet Commonly known as: NORCO/VICODIN   potassium chloride 10 MEQ tablet Commonly known as: KLOR-CON       TAKE these medications    acetaminophen 325 MG tablet Commonly known as: TYLENOL Take 1,000 mg by mouth every 6 (six) hours as needed for mild pain or headache.   albuterol 108 (90 Base) MCG/ACT inhaler Commonly known as: VENTOLIN HFA Inhale 2 puffs into the lungs every 4 (four) hours as needed for wheezing.   amiodarone 200 MG tablet Commonly known as: PACERONE TAKE 1 TABLET BY MOUTH ONCE DAILY.   aspirin 325 MG EC tablet Take 1 tablet (325 mg total) by mouth daily.   budesonide-formoterol 160-4.5 MCG/ACT inhaler Commonly known as: Symbicort Inhale 2 puffs into the lungs 2 (two) times daily.   calcium carbonate 600 MG Tabs tablet Commonly known as: OS-CAL Take 600 mg by mouth daily with breakfast.   carvedilol 3.125 MG tablet Commonly known as: COREG TAKE (1) TABLET BY MOUTH TWICE A DAY WITH MEALS (BREAKFAST AND SUPPER)   cholecalciferol 10 MCG (400 UNIT) Tabs tablet Commonly known as: VITAMIN D3 Take 1,000 Units by mouth daily.   doxycycline 100 MG tablet Commonly known  as: VIBRA-TABS Take 1 tablet (100 mg total) by mouth 2 (two) times daily.   finasteride 5 MG tablet Commonly known as: PROSCAR TAKE 1 TABLET BY MOUTH ONCE DAILY.   fluticasone 50 MCG/ACT nasal spray Commonly known as: FLONASE Place 2 sprays into both nostrils at bedtime.   hydrochlorothiazide 12.5 MG tablet Commonly known as: HYDRODIURIL TAKE 1 TABLET BY MOUTH IN THE MORNING.   ipratropium-albuterol 0.5-2.5 (3) MG/3ML Soln Commonly known as: DUONEB Take 3 mLs by nebulization every 6 (six) hours for 9 days. Will have PCP re-assess to see if need to continue long-term   mirabegron ER 50 MG Tb24 tablet Commonly known as: MYRBETRIQ Take  50 mg by mouth daily.   Mucinex DM 30-600 MG Tb12 Take 1 tablet by mouth daily.   oxymetazoline 0.05 % nasal spray Commonly known as: AFRIN Place 1 spray into both nostrils at bedtime.   pantoprazole 40 MG tablet Commonly known as: PROTONIX Take 1 tablet (40 mg total) by mouth daily. While on steroids Start taking on: November 24, 2021   polyethylene glycol 17 g packet Commonly known as: MIRALAX / GLYCOLAX Take 17 g by mouth daily as needed.   polyvinyl alcohol 1.4 % ophthalmic solution Commonly known as: LIQUIFILM TEARS Place 1 drop into both eyes as needed for dry eyes.   potassium chloride 10 MEQ tablet Commonly known as: KLOR-CON M TAKE 1 TABLET BY MOUTH TWICE DAILY   predniSONE 20 MG tablet Commonly known as: DELTASONE 2 qd for 5 days   rOPINIRole 2 MG tablet Commonly known as: REQUIP TAKE 1 TABLET BY MOUTH AT LATE AFTERNOON AND AT BEDTIME What changed: See the new instructions.   sodium chloride 0.65 % Soln nasal spray Commonly known as: OCEAN Place 1 spray into both nostrils as needed for congestion.   tamsulosin 0.4 MG Caps capsule Commonly known as: FLOMAX TAKE (1) CAPSULE BY MOUTH TWICE DAILY. What changed: See the new instructions.   traMADol 50 MG tablet Commonly known as: ULTRAM TAKE (1) TABLET BY MOUTH EVERY  TWELVE HOURS AS NEEDED. What changed: See the new instructions.   vitamin C 500 MG tablet Commonly known as: ASCORBIC ACID Take 500 mg by mouth daily.   ZINC ACETATE PO Take 1 tablet by mouth daily.               Durable Medical Equipment  (From admission, onward)           Start     Ordered   11/22/21 1200  For home use only DME oxygen  Once       Question Answer Comment  Length of Need Lifetime   Mode or (Route) Nasal cannula   Liters per Minute 2   Oxygen delivery system Gas      11/22/21 1159   11/22/21 1003  For home use only DME Nebulizer machine  Once       Question Answer Comment  Patient needs a nebulizer to treat with the following condition COPD (chronic obstructive pulmonary disease) ([place])   Length of Need Lifetime      11/22/21 1003            Follow-up Information     Kathyrn Drown, MD Follow up.   Specialty: Family Medicine Why: ideally by the end of the week to assess your lung Contact information: [place] [address] [phone]         Satira Sark, MD .   Specialty: Cardiology Contact information: [place]  [address] [phone]                  Time coordinating discharge: 35 min  Signed:  Geradine Girt DO  [facility] 11/23/2021, 2:26 PM

## 2021-11-23 NOTE — Telephone Encounter (Signed)
Transition Care Management Follow-up Telephone Call Date of discharge and from where: 11/23/21 [facility] How have you been since you were released from the hospital? Pt states he is doing okay. Any questions or concerns? No  Items Reviewed: Did the pt receive and understand the discharge instructions provided? Yes  Medications obtained and verified? Yes  Other? No  Any new allergies since your discharge? No  Dietary orders reviewed? Yes Do you have support at home? Yes   Home Care and Equipment/Supplies: Were home health services ordered? no If so, what is the name of the agency? N/A  Has the agency set up a time to come to the patient's home? not applicable Were any new equipment or medical supplies ordered?  Yes: O2 and Nebulizer What is the name of the medical supply agency? [facility] Were you able to get the supplies/equipment? yes Do you have any questions related to the use of the equipment or supplies? No  Functional Questionnaire: (I = Independent and D = Dependent) ADLs: I  Bathing/Dressing- I  Meal Prep- I  Eating- I  Maintaining continence- I  Transferring/Ambulation- D, USES WALKER.  Managing Meds- I  Follow up appointments reviewed:  PCP Hospital f/u appt confirmed? Yes  Scheduled to see Dr. Wolfgang Phoenix on 11/27/2021 @ 11:20. Center Hospital f/u appt confirmed?  Dr. Domenic Polite  Appt  has not been scheduled yet. Are transportation arrangements needed? No  If their condition worsens, is the pt aware to call PCP or go to the Emergency Dept.? Yes Was the patient provided with contact information for the PCP's office or ED? Yes Was to pt encouraged to call back with questions or concerns? Yes

## 2021-11-23 NOTE — Progress Notes (Signed)
Patient Information  Patient Name  Gerald Hall, Gerald Hall ([id number]) Legal Sex  Male DOB  09-16-1935  Room Bed  A320 A320-01    Patient Demographics  Address  [place] [address] Phone  [phone] (Home)  [phone] (Mobile) *Preferred* E-mail Address  [email or web address]    Basic Information  Date Of Birth  [birth year] Gender Identity  Male Race  White or Caucasian Ethnic Group  Not Hispanic or Latino Preferred Language  English    Patient Contacts  Name Relation Home Work Mobile  [place] Daughter   [phone]  Hayes,Nancy Daughter   [phone]    Documents on File   Status Date Received Description  Documents for the Patient  EMR Patient Summary Not Received    Driver's License Not Received    [place] E-Signature HIPAA Notice of Privacy Received 02/21/14   [place] E-Signature HIPAA Notice of Privacy Spanish Not Received    [place] Not Received    Insurance Card Not Received    Advance Directives/Living Will/HCPOA/POA Not Received    Advanced Beneficiary Notice (ABN) Not Received    [place] E-Signature HIPAA Notice of Privacy Received 09/11/13   [place] E-Signature HIPAA Notice of Privacy Spanish Received 08/14/13   [place] Not Received    Driver's License Not Received    Insurance Card Not Received  RFM MEDICARE(NEW)  Advance Directives/Living Will/HCPOA/POA Not Received    Advanced Beneficiary Notice (ABN) Not Received    Insurance Card Received 02/21/17 mcr [facility] [place]   HIM ROI Authorization  (Expired) 02/25/14 [facility] needing records for review before appointment.  AMB Outside Consult Note  03/01/14 04/15  [facility]  AMB Correspondence  03/21/14 04/15 OFFICE NOTES [facility]  AMB Correspondence  04/03/14 05/15 OFFICE NOTES [facility] Record  07/31/14 PROGRESS NOTE  [facility]  E-Signature AOB  Spanish Not Received    Other Photo ID Not Received    Insurance Card     HIM ROI Authorization  (Expired) 01/30/15 rfm  Release of Information  01/31/15   AMB Outside Hospital Record  03/04/15 04/16 PROGRESS NOTES [facility] Record  02/28/15 PROGRESS NOTES [facility] Record  03/21/15 PROGRESS NOTES [facility]  Release of Information  05/29/15   AMB Outside Hospital Record  06/14/15 OFFICE NOTES [place] Card Received 22/48/25 [facility]  Driver's License Received 00/37/04 [place]  [place] E-Signature HIPAA Notice of Privacy Received 08/18/15   HIM ROI Authorization  09/24/15   AMB Outside Hospital Record  09/25/15 [facility] Record  01/22/16 NOTES [facility]  HIM ROI Authorization  (Expired) 10/04/16 [facility]  AMB Outside Hospital Record  11/05/16 LETTER [facility]  AMB Outside Hospital Record  01/18/17 PROGRESS NOTES [facility]  AMB HH/NH/Hospice  88/89/16 CERTIFICATION/POC [facility]  HIM ROI Authorization  02/09/19   AMB Correspondence Received 06/13/19 POC [facility]  AMB Correspondence Received 06/28/19 OFFICE VISIT REPORT [facility]  AMB Correspondence Received 07/30/19 OFFICE VISIT REPORT [facility],P  Release of Information Received 09/13/19   AMB Correspondence Received 07/30/19 OFFICE VISIT REPORT [facility]  AMB Correspondence Received 07/19/19 PROCEDURE VISIT REPORT [facility]  AMB Correspondence Received 01/30/20 [facility]  HIM ROI Authorization  (Expired) 01/30/20 [facility]  HIM ROI Authorization  (Expired) 03/03/20 Authorization for  batch [facility]/UHC 2021 MRA REVIEW OUTREACH NU#[id number]    fbg      03/03/20  AMB Correspondence Received 03/03/20 OFFICE VISIT REPORT [facility]  AMB Correspondence Received 04/10/20 OFFICE VISIT REPORT [facility]  HIM ROI  Authorization  (Expired) 05/22/20 [facility]  AMB HH/NH/Hospice Received 40/34/74 HH CERT & POC ENCOMPASS [facility] Record Received 07/03/20 [facility]  AMB Correspondence Received 07/11/20 OFFICE VISIT REPORT [facility]  AMB Referral Received 08/15/20   Release of Information Received 09/25/20 [facility] 2021  Release of Information Received 09/29/20   HIM ROI Authorization  02/12/21 [facility] - MRA AUDITS  AMB Correspondence Received 07/08/21 [place] 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  Insurance Card Received (Expired) 08/10/21 AARP Medicare  Insurance Card Received (Expired) 10/15/21 [facility] Medicare 2022  Insurance Card Received 11/10/21 [place] [employer] Card Received (Deleted) 09/11/13 Medicare/[place]  AMB Outside Consult Note  (Deleted) 03/01/14 04/15 OFFICE NOTES [facility]  AMB Correspondence  (Deleted) 04/01/14 05/15 OFFICE NOTES [facility] Record  (Deleted) 02/28/15 PROGRESS NOTES [facility] Record  (Deleted) 02/28/15 PROGRESS NOTES [facility] Record  (Deleted) 11/05/16 [facility]  AMB Provider Completed Forms  (Deleted) 01/18/17 PROGRESS NOTES [place]  HIM Release of Information Output  (Deleted) 02/09/19 Requested records  HIM Release of Information Output  01/30/20 Requested records  HIM Release of Information Output  05/22/20 Requested records  HIM Release of Information Output  (Deleted) 02/12/21 Requested records  Documents for the Encounter  Waiver of MSE E-sig Signed 11/20/21   AOB  (Assignment of Insurance Benefits) Not Received    E-signature AOB Signed 11/20/21   MEDICARE RIGHTS Received 11/22/21 Hayes,Nancy Daughter [phone] gave verbal consent for MRF. Annie Main 11/22/2021 7:33pm  Annual Exam - E-Signature PCP     Disclosure No Surprises Act Received 11/20/21 paper  ED Patient Billing Extract   ED PB Billing Extract  Cardiac Monitoring  Strip Received 11/21/21   Medicare Observation  11/21/21   Cardiac Monitoring Strip Shift Summary Received 11/21/21   E-signature Medicare Rights  (Deleted)    Medicare Observation  (Deleted)      Admission Information   Current Information  Attending Provider Admitting Provider Admission Type Admission Status  Selmer Dominion, DO Emergency Confirmed Admission        Admission Date/Time Discharge Date Hospital Service Auth/Cert Status  43/32/95  05:29 PM  [place] Unit Room/Bed   [facility] AP-DEPT 300 A320/A320-01             Admission  Complaint  [facility] Account  Name Acct ID Class Status Primary Coverage  Eyoel, [place] [id number] [place]        Guarantor Account (for Hospital Account [id number])  Name Relation to Pt Service Area Active? Acct Type  [name] Self CHSA Yes Personal/Family  Address Phone    [place], [place] [address] [phone])          Coverage Information (for Hospital Account [id number])  F/O Payor/Plan Precert #  [facility] [place] #  [place], Diltz [id number]  Address Phone  [place] [address] [phone]  Care Everywhere ID:  [id number]

## 2021-11-23 NOTE — TOC Transition Note (Signed)
Transition of Care [facility]) - CM/SW Discharge Note   Patient Details  Name: Gerald Hall MRN: [id number] Date of Birth: May 04, 1935  [facility]) CM/SW Contact:  Salome Arnt, LCSW Phone Number: 11/23/2021, 12:20 PM   Clinical Narrative:  Pt d/c today. Nebulizer and home O2 orders sent to [employer]. Per Maudie Mercury at [facility], will be delivered to room within hour. Pt notified. [place] will discuss with pt for home set up. No other needs reported by pt at this time. RN updated.      Final next level of care: Home/Self Care Barriers to Discharge: Barriers Resolved   Patient Goals and CMS Choice Patient states their goals for this hospitalization and ongoing recovery are:: Home with [facility] CMS Medicare.gov Compare Post Acute Care list provided to:: Patient Choice offered to / list presented to : Patient  Discharge Placement                  Name of family member notified: pt only Patient and family notified of of transfer: 11/23/21  Discharge Plan and Services In-house Referral: Clinical Social Work Discharge Planning Services: CM Consult Post Acute Care Choice: Home Health          DME Arranged: Nebulizer/meds, Oxygen DME Agency: [facility] Date DME Agency Contacted: 11/23/21 Time DME Agency Contacted: 1219 Representative spoke with at DME Agency: [place] ([place]) Interventions     Readmission Risk Interventions No flowsheet data found.

## 2021-11-27 ENCOUNTER — Other Ambulatory Visit: Payer: Self-pay

## 2021-11-27 ENCOUNTER — Encounter: Payer: Self-pay | Admitting: Family Medicine

## 2021-11-27 ENCOUNTER — Other Ambulatory Visit: Payer: Self-pay | Admitting: Family Medicine

## 2021-11-27 ENCOUNTER — Ambulatory Visit (INDEPENDENT_AMBULATORY_CARE_PROVIDER_SITE_OTHER): Payer: Medicare Other | Admitting: Family Medicine

## 2021-11-27 VITALS — BP 140/78 | HR 70 | Temp 97.6°F | Wt 189.6 lb

## 2021-11-27 DIAGNOSIS — R0609 Other forms of dyspnea: Secondary | ICD-10-CM

## 2021-11-27 DIAGNOSIS — J441 Chronic obstructive pulmonary disease with (acute) exacerbation: Secondary | ICD-10-CM

## 2021-11-27 DIAGNOSIS — N1831 Chronic kidney disease, stage 3a: Secondary | ICD-10-CM | POA: Diagnosis not present

## 2021-11-27 NOTE — Progress Notes (Signed)
° °  Subjective:    Patient ID: Gerald Hall, male    DOB: 03/10/1935, 87 y.o.   MRN: [id number]  HPI Pt was at [facility] from 11/20/21-11/23/21 for COPD flare up. Pt still having dry cough. Pt states doing well today. Pt has pain in lower left lung when beginning to cough. Pt is on oxygen (2L) and daughter is wanting to know if can order portable oxygen if PCP wants pt to stay on oxygen. Pt is not sleeping well.  Patient having significant shortness of breath but not as bad as it was when he went into the hospital.  When he sitting still shortness of breath is minimal when he is active it is worse he can clearly tell the difference with the oxygen and has more energy he does have a O2 sat meter at home.  He is trying to cut back on smoking we did discuss quitting  His hospital labs x-rays and results were reviewed in detail BNP was up in the hospital His daughter-Sarah was with him today  Review of Systems     Objective:   Physical Exam General-in no acute distress Eyes-no discharge Lungs-respiratory rate normal, CTA CV-no murmurs,RRR Extremities skin warm dry no edema Neuro grossly normal Behavior normal, alert        Assessment & Plan:  1. DOE (dyspnea on exertion) Recent hospitalization now currently on oxygen.  He will check oxygen at home at rest with the oxygen and without the oxygen He will keep a running log of this and send it to [place] or bring it with him on the next visit  He benefits from the oxygen especially with ambulation based on hospital documentation he qualifies They would like to have more portable O2 rather than the metal tanks we will check into this with Gwyneth Sprout  We did discuss possibly seeing a pulmonologist he is already on proper medicines and we are going to be doing up-to-date pulmonary function test.  We reviewed over the previous PFT from 2019.  Patient states that he does not want to see pulmonary currently  With his dyspnea on exertion  it is important for [place] to do echo as well because his BNP was elevated.  Need to rule out left ventricular dysfunction - Pulmonary function test  2. COPD with exacerbation ([place]) He is finishing up the prednisone I do not feel he needs more we will do the pulmonary function test - Pulmonary function test  3. Stage 3a chronic kidney disease ([place]) He has stage III chronic kidney disease avoid anti-inflammatories continue current measures keep blood pressure under good control smoking healthy diet will work with this.  Will discuss with Mr. Lakeman more on his follow-up visit what his thoughts are regarding his overall health and what he would like to have completed

## 2021-11-28 DIAGNOSIS — N183 Chronic kidney disease, stage 3 unspecified: Secondary | ICD-10-CM | POA: Insufficient documentation

## 2021-11-28 DIAGNOSIS — N1832 Chronic kidney disease, stage 3b: Secondary | ICD-10-CM | POA: Insufficient documentation

## 2021-11-28 DIAGNOSIS — N1831 Chronic kidney disease, stage 3a: Secondary | ICD-10-CM | POA: Insufficient documentation

## 2021-12-01 DIAGNOSIS — E061 Subacute thyroiditis: Secondary | ICD-10-CM | POA: Diagnosis not present

## 2021-12-02 ENCOUNTER — Other Ambulatory Visit: Payer: Self-pay

## 2021-12-02 ENCOUNTER — Encounter: Payer: Self-pay | Admitting: "Endocrinology

## 2021-12-02 ENCOUNTER — Ambulatory Visit (INDEPENDENT_AMBULATORY_CARE_PROVIDER_SITE_OTHER): Payer: Medicare Other | Admitting: "Endocrinology

## 2021-12-02 VITALS — BP 122/64 | HR 68 | Ht 68.0 in | Wt 197.4 lb

## 2021-12-02 DIAGNOSIS — E0590 Thyrotoxicosis, unspecified without thyrotoxic crisis or storm: Secondary | ICD-10-CM

## 2021-12-02 LAB — T4, FREE: Free T4: 2.18 ng/dL — ABNORMAL HIGH (ref 0.82–1.77)

## 2021-12-02 LAB — TSH: TSH: 0.109 u[IU]/mL — ABNORMAL LOW (ref 0.450–4.500)

## 2021-12-02 LAB — T3, FREE: T3, Free: 2 pg/mL (ref 2.0–4.4)

## 2021-12-02 MED ORDER — METHIMAZOLE 5 MG PO TABS: 5.0000 mg | ORAL_TABLET | Freq: Every day | ORAL | 3 refills | Status: DC

## 2021-12-02 NOTE — Progress Notes (Signed)
12/02/2021, 7:14 PM  Endocrinology follow-up note   Subjective:    Patient ID: Gerald Hall, male    DOB: 1935/04/08, PCP Kathyrn Drown, MD   Past Medical History:  Diagnosis Date   Arthritis    Balance problem    Uses a walker   Blood in urine    Sees urology yearly   Deafness in right ear age 87   Hyperlipidemia    Hypertension    PSVT (paroxysmal supraventricular tachycardia) ([place])    Temporal arteritis ([place]) 2016   Urinary incontinence    Past Surgical History:  Procedure Laterality Date   BACK SURGERY  2010   [place]     CATARACT EXTRACTION Bilateral 07/30/14, 08/13/2014   CERVICAL SPINE SURGERY  09/01/15   C 4-5 , [place]   COLONOSCOPY  12/19/2007   RMR: 1. External hemorrohids, otherwise normal rectum.2. Normal colon. 3. Normal terminal ileum.   COLONOSCOPY N/A 12/13/2014   Procedure: COLONOSCOPY;  Surgeon: Daneil Dolin, MD;  Location[facility];  Service: Endoscopy;  Laterality: N/A;  11:15 Pt Request Time   KNEE SURGERY Left    around 2000, arthroscopic   TOTAL HIP ARTHROPLASTY Right 02/23/2017   Procedure: TOTAL HIP ARTHROPLASTY ANTERIOR APPROACH;  Surgeon: Hessie Knows, MD;  Location: [facility] ORS;  Service: Orthopedics;  Laterality: Right;   Social History   Socioeconomic History   Marital status: Married    Spouse name: Pamala Hurry   Number of children: 2   Years of education: 16   Highest education level: Not on file  Occupational History    Comment: retired Software engineer  Tobacco Use   Smoking status: Every Day    Packs/day: 0.50    Types: Cigarettes   Smokeless tobacco: Never   Tobacco comments:    08/18/15 1/2 - 3/4 pack cigarettes daily  Vaping Use   Vaping Use: Never used  Substance and Sexual Activity   Alcohol use: Yes    Alcohol/week: 0.0 standard drinks    Comment: mixed drink 1-2 times per month, social   Drug use: No   Sexual activity: Not on file   Other Topics Concern   Not on file  Social History Narrative   Lives at home with wife, who has PD   Caffeine use- coffee 8-10 cups daily   Social Determinants of Health   Financial Resource Strain: Low Risk    Difficulty of Paying Living Expenses: Not hard at all  Food Insecurity: No Food Insecurity   Worried About Charity fundraiser in the Last Year: Never true   [place] in the Last Year: Never true  Transportation Needs: No Transportation Needs   Lack of Transportation (Medical): No   Lack of Transportation (Non-Medical): No  Physical Activity: Inactive   Days of Exercise per Week: 0 days   Minutes of Exercise per Session: 0 min  Stress: No Stress Concern Present   Feeling of Stress : Not at all  Social Connections: Moderately Isolated   Frequency of Communication with Friends and Family: More than three times a week   Frequency of Social Gatherings with Friends and Family: Once a week   Attends Religious  Services: More than 4 times per year   Active Member of Clubs or Organizations: No   Attends Archivist Meetings: Never   Marital Status: Widowed   Family History  Problem Relation Age of Onset   Colon cancer Brother    Diabetes Brother    Diabetes Brother    Outpatient Encounter Medications as of 12/02/2021  Medication Sig   methimazole (TAPAZOLE) 5 MG tablet Take 1 tablet (5 mg total) by mouth daily.   acetaminophen (TYLENOL) 325 MG tablet Take 1,000 mg by mouth every 6 (six) hours as needed for mild pain or headache.   albuterol (VENTOLIN HFA) 108 (90 Base) MCG/ACT inhaler Inhale 2 puffs into the lungs every 4 (four) hours as needed for wheezing.   amiodarone (PACERONE) 200 MG tablet TAKE 1 TABLET BY MOUTH ONCE DAILY.   aspirin EC 325 MG EC tablet Take 1 tablet (325 mg total) by mouth daily.   budesonide-formoterol (SYMBICORT) 160-4.5 MCG/ACT inhaler Inhale 2 puffs into the lungs 2 (two) times daily.   calcium carbonate (OS-CAL) 600 MG TABS tablet  Take 600 mg by mouth daily with breakfast.   carvedilol (COREG) 3.125 MG tablet TAKE (1) TABLET BY MOUTH TWICE A DAY WITH MEALS (BREAKFAST AND SUPPER)   cholecalciferol (VITAMIN D) 400 units TABS tablet Take 1,000 Units by mouth daily.   Dextromethorphan-guaiFENesin (MUCINEX DM) 30-600 MG TB12 Take 1 tablet by mouth daily.    finasteride (PROSCAR) 5 MG tablet TAKE 1 TABLET BY MOUTH ONCE DAILY.   fluticasone (FLONASE) 50 MCG/ACT nasal spray Place 2 sprays into both nostrils at bedtime.   hydrochlorothiazide (HYDRODIURIL) 12.5 MG tablet TAKE 1 TABLET BY MOUTH IN THE MORNING.   ipratropium-albuterol (DUONEB) 0.5-2.5 (3) MG/3ML SOLN Take 3 mLs by nebulization every 6 (six) hours for 9 days. Will have PCP re-assess to see if need to continue long-term   mirabegron ER (MYRBETRIQ) 50 MG TB24 tablet Take 50 mg by mouth daily.   oxymetazoline (AFRIN) 0.05 % nasal spray Place 1 spray into both nostrils at bedtime.   polyethylene glycol (MIRALAX / GLYCOLAX) 17 g packet Take 17 g by mouth daily as needed.   polyvinyl alcohol (LIQUIFILM TEARS) 1.4 % ophthalmic solution Place 1 drop into both eyes as needed for dry eyes.   potassium chloride (KLOR-CON) 10 MEQ tablet TAKE 1 TABLET BY MOUTH TWICE DAILY   rOPINIRole (REQUIP) 2 MG tablet TAKE 1 TABLET BY MOUTH AT LATE AFTERNOON AND AT BEDTIME (Patient taking differently: Take 2 mg by mouth at bedtime.)   sodium chloride (OCEAN) 0.65 % SOLN nasal spray Place 1 spray into both nostrils as needed for congestion.   tamsulosin (FLOMAX) 0.4 MG CAPS capsule TAKE (1) CAPSULE BY MOUTH TWICE DAILY. (Patient taking differently: Take 0.4 mg by mouth 2 (two) times daily. TAKE (1) CAPSULE BY MOUTH TWICE DAILY.)   traMADol (ULTRAM) 50 MG tablet TAKE (1) TABLET BY MOUTH EVERY TWELVE HOURS AS NEEDED. (Patient taking differently: Take 50 mg by mouth 2 (two) times daily as needed for moderate pain.)   vitamin C (ASCORBIC ACID) 500 MG tablet Take 500 mg by mouth daily.   Zinc Acetate,  Oral, (ZINC ACETATE PO) Take 1 tablet by mouth daily.   [DISCONTINUED] doxycycline (VIBRA-TABS) 100 MG tablet Take 1 tablet (100 mg total) by mouth 2 (two) times daily.   [DISCONTINUED] pantoprazole (PROTONIX) 40 MG tablet Take 1 tablet (40 mg total) by mouth daily. While on steroids   [DISCONTINUED] predniSONE (DELTASONE) 20 MG tablet  2 qd for 5 days   No facility-administered encounter medications on file as of 12/02/2021.   ALLERGIES: Allergies  Allergen Reactions   Cephalexin Diarrhea    And general weakness   Penicillins     GI UPSET     VACCINATION STATUS: Immunization History  Administered Date(s) Administered   Fluad Quad(high Dose 65+) 08/12/2020, 08/10/2021   Influenza,inj,Quad PF,6+ Mos 07/20/2016, 08/07/2018, 08/22/2019   Influenza-Unspecified 09/03/2013   Moderna Sars-Covid-2 Vaccination 10/30/2019, 11/27/2019, 08/28/2020   Pneumococcal Conjugate-13 09/03/2014   Pneumococcal Polysaccharide-23 07/20/2016   Zoster Recombinat (Shingrix) 09/26/2017, 02/23/2018    HPI Gerald Hall is 87 y.o. male who presents today with repeat thyroid function tests.  He was seen in consultation last year due to abnormal thyroid function tests consistent with transient thyrotoxicosis related to thyroiditis.  He remains on treatment with amiodarone due to cardiac dysrhythmia. TDD:UKGURK, Elayne Snare, MD.  He is not currently on antithyroid intervention.  He has no new complaints today.  He denies palpitations, tremors, significant weight change.  He is currently on 200 mg of amiodarone daily.   -  His thyroid uptake and scan on September 09, 2020 revealed low uptake of 1.3% in 24 hours. -His pre-visit labs are c/w significant hyperthyroidism.  He remains active smoker.  He is currently being treated for squamous cell carcinoma of the skin over his left upper extremity.  He does not have acute complaints today.  Review of Systems Limited as above.  Objective:    Vitals with BMI 12/02/2021  11/27/2021 11/23/2021  Height 5\' 8"  - -  Weight 197 lbs 6 oz 189 lbs 10 oz -  BMI 27.06 23.76 -  Systolic 283 151 761  Diastolic 64 78 86  Pulse 68 70 73    BP 122/64    Pulse 68    Ht 5\' 8"  (1.727 m)    Wt 197 lb 6.4 oz (89.5 kg)    BMI 30.01 kg/m   Wt Readings from Last 3 Encounters:  12/02/21 197 lb 6.4 oz (89.5 kg)  11/27/21 189 lb 9.6 oz (86 kg)  11/21/21 193 lb 14.4 oz (88 kg)    Physical Exam  Constitutional:  Body mass index is 30.01 kg/m.,  not in acute distress, normal state of mind, + relates with a walker. Eyes: PERRLA, EOMI, no exophthalmos   CMP ( most recent) CMP     Component Value Date/Time   NA 140 11/23/2021 0421   NA 140 07/16/2021 1105   K 4.1 11/23/2021 0421   CL 104 11/23/2021 0421   CO2 29 11/23/2021 0421   GLUCOSE 113 (H) 11/23/2021 0421   BUN 36 (H) 11/23/2021 0421   BUN 30 (H) 07/16/2021 1105   CREATININE 1.36 (H) 11/23/2021 0421   CREATININE 1.14 09/06/2014 1259   CALCIUM 8.9 11/23/2021 0421   PROT 6.8 11/21/2021 0445   PROT 6.6 07/16/2021 1105   ALBUMIN 3.5 11/21/2021 0445   ALBUMIN 4.1 07/16/2021 1105   AST 18 11/21/2021 0445   ALT 23 11/21/2021 0445   ALKPHOS 73 11/21/2021 0445   BILITOT 0.3 11/21/2021 0445   BILITOT 0.5 07/16/2021 1105   GFRNONAA 51 (L) 11/23/2021 0421   GFRAA 48 (L) 11/24/2020 1519     Diabetic Labs (most recent): Lab Results  Component Value Date   HGBA1C 5.6 08/18/2015     Lipid Panel ( most recent) Lipid Panel     Component Value Date/Time   CHOL 183 08/05/2020 0932   TRIG 113  08/05/2020 0932   HDL 40 08/05/2020 0932   CHOLHDL 4.6 08/05/2020 0932   CHOLHDL 3.7 09/06/2014 1259   VLDL 29 09/06/2014 1259   LDLCALC 122 (H) 08/05/2020 0932   LABVLDL 21 08/05/2020 0932      Lab Results  Component Value Date   TSH 0.109 (L) 12/01/2021   TSH 0.578 05/26/2021   TSH 0.482 02/20/2021   TSH 0.48 02/20/2021   TSH 0.652 11/24/2020   TSH 0.379 (L) 08/05/2020   TSH 0.658 05/21/2020   TSH 0.376 (L)  01/23/2020   TSH 1.047 02/23/2019   TSH 0.752 08/06/2015   FREET4 2.18 (H) 12/01/2021   FREET4 1.85 (H) 05/26/2021   FREET4 1.76 02/20/2021   FREET4 1.60 11/24/2020   FREET4 1.79 (H) 08/05/2020   FREET4 1.81 (H) 05/21/2020        Thyroid uptake and scan from September 09, 2020  FINDINGS: Very low uptake within thyroid gland.  4 hour I-123 uptake = 1.3% (normal 5-20%),  24 hour I-123 uptake = 1.3% (normal 10-30%)   IMPRESSION: Very low uptake within the thyroid gland. Recommend clinical correlation for subacute thyroiditis.   Assessment & Plan:   1. Subacute thyroiditis -I reviewed his recent thyroid function tests which are consistent hyperthyroidism.  Previously, he has  had subacute thyroiditis most likely related to the background history of amiodarone treatment.   He will benefit from intervention. He will not need ablative therapy for now. I discussed and initiated low dose methimazole 5 mg po qam.     -I discussed the fact that amiodarone can still be utilized as his cardiologist sees fit and will manage the thyroid dysfunction on the side as necessary. Smoking puts this patient at higher risk of health complications including COPD. T The patient was counseled on the dangers of tobacco use, and was advised to quit.  Reviewed strategies to maximize success, including removing cigarettes and smoking materials from environment.   - I did not initiate any new prescriptions today.  He will return with repeat thyroid function test in 6 months. - he is advised to maintain close follow up with Kathyrn Drown, MD for primary care needs.  The patient was counseled on the dangers of tobacco use, and was advised to quit.  Reviewed strategies to maximize success, including removing cigarettes and smoking materials from environment.  Follow up plan: Return in about 3 months (around 03/01/2022) for F/U with Pre-visit Labs.   Glade Lloyd, MD [facility] [address] [place], [place] [address] Phone: [phone]  Fax: [phone]     12/02/2021, 7:14 PM  This note was partially dictated with voice recognition software. Similar sounding words can be transcribed inadequately or may not  be corrected upon review.

## 2021-12-04 ENCOUNTER — Other Ambulatory Visit: Payer: Self-pay | Admitting: Family Medicine

## 2021-12-09 ENCOUNTER — Telehealth: Payer: Self-pay | Admitting: Family Medicine

## 2021-12-09 ENCOUNTER — Telehealth: Payer: Self-pay

## 2021-12-09 NOTE — Telephone Encounter (Signed)
error 

## 2021-12-09 NOTE — Telephone Encounter (Signed)
Daughter Gerald Hall) had some concerns about patient using albuterol. He was suppose to use for one week but was given a 2 month supply. She wants to if he should continue to use. Is it ok to use O2 only sitting does not want to use while walking. Please advise

## 2021-12-11 NOTE — Telephone Encounter (Signed)
Daughter Nancy(DPR) advised per Dr Nicki Reaper: So he is on Symbicort twice daily which is meant to be a maintenance medicine The purpose of this is to try to keep his COPD within reasonable range Albuterol can be used as a rescue inhaler In some individuals on any given day they may have to use it more often than on other days hardly use at all   Certainly they can keep track of how often he is having to utilize the albuterol.  When he comes in for his visit in early March we can discuss further or if they would like to discuss further before then let me know  As for the oxygen it is more within reason optional.  If he is moving from 1 room to another room in the house he may not necessarily need it but once he gets to the other room he should use his oxygen.  If he is getting out to do longer walking it would be beneficial for him to utilize.  Ideally we would like to see the O2 saturations staying in the 90s not in the 80s  Daughter verbalized understanding and stated her dad has been using his albuterol every 6 hrs around the clock since getting out of the hospital (thinks he didn't understand ) but she will talk to him about only using as rescue inhaler and make sure the Symbicort is being used twice a day. O2 sats staying in low 90s even without o2 but will keep close eye on it. Daughter will call back if any problems

## 2021-12-11 NOTE — Telephone Encounter (Signed)
So he is on Symbicort twice daily which is meant to be a maintenance medicine The purpose of this is to try to keep his COPD within reasonable range Albuterol can be used as a rescue inhaler In some individuals on any given day they may have to use it more often than on other days hardly use at all  Certainly they can keep track of how often he is having to utilize the albuterol.  When he comes in for his visit in early March we can discuss further or if they would like to discuss further before then let me know  As for the oxygen it is more within reason optional.  If he is moving from 1 room to another room in the house he may not necessarily need it but once he gets to the other room he should use his oxygen.  If he is getting out to do longer walking it would be beneficial for him to utilize.  Ideally we would like to see the O2 saturations staying in the 90s not in the 80s

## 2021-12-15 ENCOUNTER — Other Ambulatory Visit: Payer: Self-pay | Admitting: Family Medicine

## 2021-12-17 DIAGNOSIS — Z872 Personal history of diseases of the skin and subcutaneous tissue: Secondary | ICD-10-CM | POA: Diagnosis not present

## 2021-12-17 DIAGNOSIS — L570 Actinic keratosis: Secondary | ICD-10-CM | POA: Diagnosis not present

## 2021-12-17 DIAGNOSIS — L578 Other skin changes due to chronic exposure to nonionizing radiation: Secondary | ICD-10-CM | POA: Diagnosis not present

## 2021-12-17 DIAGNOSIS — Z85828 Personal history of other malignant neoplasm of skin: Secondary | ICD-10-CM | POA: Diagnosis not present

## 2021-12-17 DIAGNOSIS — Z859 Personal history of malignant neoplasm, unspecified: Secondary | ICD-10-CM | POA: Diagnosis not present

## 2021-12-22 DIAGNOSIS — J440 Chronic obstructive pulmonary disease with acute lower respiratory infection: Secondary | ICD-10-CM | POA: Diagnosis not present

## 2021-12-25 ENCOUNTER — Other Ambulatory Visit: Payer: Self-pay

## 2021-12-25 ENCOUNTER — Ambulatory Visit (INDEPENDENT_AMBULATORY_CARE_PROVIDER_SITE_OTHER): Payer: Medicare Other | Admitting: Family Medicine

## 2021-12-25 ENCOUNTER — Encounter: Payer: Self-pay | Admitting: Family Medicine

## 2021-12-25 VITALS — BP 138/72 | HR 63 | Temp 98.3°F | Ht 68.0 in | Wt 201.0 lb

## 2021-12-25 DIAGNOSIS — J432 Centrilobular emphysema: Secondary | ICD-10-CM | POA: Diagnosis not present

## 2021-12-25 DIAGNOSIS — J449 Chronic obstructive pulmonary disease, unspecified: Secondary | ICD-10-CM

## 2021-12-25 DIAGNOSIS — G2581 Restless legs syndrome: Secondary | ICD-10-CM

## 2021-12-25 DIAGNOSIS — R0902 Hypoxemia: Secondary | ICD-10-CM

## 2021-12-25 DIAGNOSIS — R531 Weakness: Secondary | ICD-10-CM

## 2021-12-25 DIAGNOSIS — Z720 Tobacco use: Secondary | ICD-10-CM

## 2021-12-25 DIAGNOSIS — G4700 Insomnia, unspecified: Secondary | ICD-10-CM

## 2021-12-25 NOTE — Progress Notes (Signed)
epic:EPIC?ACTIVITY2&AC_REPORT_VIEWER_FLOATING&&MR_REPORTS%7C%7C%[id number]%[id number]%[id number]%[id number]&&&&&False&Default&False ? ?Subjective:  ? ? Patient ID: Gerald Hall, male    DOB: June 16, 1935, 87 y.o.   MRN: [id number] ? ?HPI ? ?DOE follow up is currently on 2 L continuous O2  ?Patient has severe underlying COPD ?He is oxygen dependent with the oxygen he is hypoxic ?He does get out of breath with moderate activity and moving around and walking.  Denies PND denies orthopnea.  No chest tightness pressure pain ?No recent fever chills or coughing ?Review of Systems ? ?   ?Objective:  ? Physical Exam ? ?Lungs sound clear bilateral extremities no edema skin warm dry muscle mass is small-losing muscle mass ? ? ?   ?Assessment & Plan:  ?1. Tobacco use ?Patient was counseled to quit smoking could be difficult for him to do so ? ?2. Centrilobular emphysema ([place]) ?Continue oxygen.  Continue inhalers. ? ?3. COPD with hypoxia ([place]) ?Continue oxygen and benefits from this ? ?4. Weakness ?Increase walking to every other day for exercise within the household he is limited by severe COPD ? ?5. Restless legs ?Currently medication he states he has to take it earlier in the day because he has symptoms but then he states medicine will make him sleepy but then at night he wants sleep we did discuss some ways to try to avoid that issue we will look into see if there are other options for restless legs ? ?6. Insomnia, unspecified type ?Perhaps there might be some medicine help with restless legs and also help with his sleep we will look into this ? ? ?

## 2021-12-26 ENCOUNTER — Other Ambulatory Visit: Payer: Self-pay | Admitting: Family Medicine

## 2022-01-05 ENCOUNTER — Ambulatory Visit (HOSPITAL_COMMUNITY)
Admission: RE | Admit: 2022-01-05 | Discharge: 2022-01-05 | Disposition: A | Discharge status: Home / Self Care | Payer: Medicare Other | Source: Ambulatory Visit | Attending: Family Medicine | Admitting: Family Medicine

## 2022-01-05 DIAGNOSIS — R0609 Other forms of dyspnea: Secondary | ICD-10-CM | POA: Diagnosis not present

## 2022-01-05 LAB — ECHOCARDIOGRAM COMPLETE
Area-P 1/2: 4.15 cm2
MV M vel: 4.36 m/s
MV Peak grad: 76 mmHg
Radius: 0.65 cm
S' Lateral: 4.2 cm

## 2022-01-05 NOTE — Progress Notes (Signed)
*  PRELIMINARY RESULTS* ?Echocardiogram ?2D Echocardiogram has been performed. ? ?Gerald Hall ?01/05/2022, 12:38 PM ?

## 2022-01-06 ENCOUNTER — Other Ambulatory Visit: Payer: Self-pay | Admitting: Family Medicine

## 2022-01-06 DIAGNOSIS — I519 Heart disease, unspecified: Secondary | ICD-10-CM

## 2022-01-06 DIAGNOSIS — N289 Disorder of kidney and ureter, unspecified: Secondary | ICD-10-CM

## 2022-01-22 DIAGNOSIS — J449 Chronic obstructive pulmonary disease, unspecified: Secondary | ICD-10-CM | POA: Diagnosis not present

## 2022-02-01 ENCOUNTER — Ambulatory Visit: Payer: Medicare Other | Admitting: Family Medicine

## 2022-02-03 ENCOUNTER — Telehealth: Payer: Self-pay | Admitting: *Deleted

## 2022-02-03 ENCOUNTER — Ambulatory Visit: Payer: Medicare Other | Admitting: *Deleted

## 2022-02-03 NOTE — Telephone Encounter (Signed)
?  Care Management  ? ?Follow Up Note ? ? ?02/03/2022 ?Name: Gerald Hall MRN: [id number] DOB: Jun 17, 1935 ? ? ?Referred by: Kathyrn Drown, MD ?Reason for referral : No chief complaint on file. ? ? ?An unsuccessful telephone outreach was attempted today. The patient was referred to the case management team for assistance with care management and care coordination.  ? ?Follow Up Plan: Telephone follow up appointment with care management team member scheduled for: upon care guide rescheduling. ? ?Jacqlyn Larsen RNC, BSN ?RN Case Manager ?[place] ?[phone] ? ? ?

## 2022-02-03 NOTE — Chronic Care Management (AMB) (Signed)
? ?  02/03/2022 ? ?[name] ?August 29, 1935 ?[id number] ? ? ?Care Management  ? ?Follow Up Note ? ? ?02/03/2022 ?Name: CARLESS SLATTEN MRN: [id number] DOB: 1935/04/03 ? ? ?Referred by: Kathyrn Drown, MD ?Reason for referral : Chronic Care Management (COPD) ? ? ?Patient called back and requested return phone call, RN care manager spoke with patient and explained program and patient feels he does not want to participate in program at present. ?Case closed. ? ?Jacqlyn Larsen RNC, BSN ?RN Case Manager ?[place] ?[phone] ? ? ? ?

## 2022-02-12 ENCOUNTER — Ambulatory Visit (HOSPITAL_COMMUNITY)
Admission: RE | Admit: 2022-02-12 | Discharge: 2022-02-12 | Disposition: A | Discharge status: Home / Self Care | Payer: Medicare Other | Source: Ambulatory Visit | Attending: Family Medicine | Admitting: Family Medicine

## 2022-02-12 ENCOUNTER — Ambulatory Visit (INDEPENDENT_AMBULATORY_CARE_PROVIDER_SITE_OTHER): Payer: Medicare Other | Admitting: Family Medicine

## 2022-02-12 VITALS — BP 128/69 | HR 64 | Temp 98.1°F | Wt 183.0 lb

## 2022-02-12 DIAGNOSIS — M5136 Other intervertebral disc degeneration, lumbar region: Secondary | ICD-10-CM | POA: Diagnosis not present

## 2022-02-12 DIAGNOSIS — G2581 Restless legs syndrome: Secondary | ICD-10-CM | POA: Diagnosis not present

## 2022-02-12 DIAGNOSIS — S2242XA Multiple fractures of ribs, left side, initial encounter for closed fracture: Secondary | ICD-10-CM | POA: Insufficient documentation

## 2022-02-12 DIAGNOSIS — S300XXA Contusion of lower back and pelvis, initial encounter: Secondary | ICD-10-CM

## 2022-02-12 DIAGNOSIS — M25512 Pain in left shoulder: Secondary | ICD-10-CM | POA: Insufficient documentation

## 2022-02-12 DIAGNOSIS — M4316 Spondylolisthesis, lumbar region: Secondary | ICD-10-CM | POA: Diagnosis not present

## 2022-02-12 DIAGNOSIS — M79602 Pain in left arm: Secondary | ICD-10-CM | POA: Diagnosis not present

## 2022-02-12 DIAGNOSIS — M545 Low back pain, unspecified: Secondary | ICD-10-CM

## 2022-02-12 DIAGNOSIS — M19012 Primary osteoarthritis, left shoulder: Secondary | ICD-10-CM | POA: Insufficient documentation

## 2022-02-12 DIAGNOSIS — M898X1 Other specified disorders of bone, shoulder: Secondary | ICD-10-CM | POA: Insufficient documentation

## 2022-02-12 DIAGNOSIS — M47816 Spondylosis without myelopathy or radiculopathy, lumbar region: Secondary | ICD-10-CM | POA: Insufficient documentation

## 2022-02-12 DIAGNOSIS — S46009A Unspecified injury of muscle(s) and tendon(s) of the rotator cuff of unspecified shoulder, initial encounter: Secondary | ICD-10-CM | POA: Diagnosis not present

## 2022-02-12 DIAGNOSIS — W19XXXA Unspecified fall, initial encounter: Secondary | ICD-10-CM | POA: Diagnosis not present

## 2022-02-12 MED ORDER — PREGABALIN 25 MG PO CAPS: 25.0000 mg | ORAL_CAPSULE | Freq: Two times a day (BID) | ORAL | 1 refills | Status: DC

## 2022-02-12 NOTE — Progress Notes (Signed)
? ?  Subjective:  ? ? Patient ID: Gerald Hall, male    DOB: [birth year], 87 y.o.   MRN: [id number] ? ?HPI ? ?Patient here for follow up. ? ?Patient fell 3 Fridays ago, fell backwards, and landed on back. He is bruised up. Patient is also having pelvic pain. ?Acute pain of left shoulder - Plan: DG Shoulder Left, DG Clavicle Left, DG Lumbar Spine Complete ? ?Pain of left clavicle - Plan: DG Shoulder Left, DG Clavicle Left, DG Lumbar Spine Complete ? ?Lumbar pain - Plan: DG Shoulder Left, DG Clavicle Left, DG Lumbar Spine Complete ? ?Fall, initial encounter - Plan: DG Shoulder Left, DG Clavicle Left, DG Lumbar Spine Complete ? ?Contusion of lower back, initial encounter - Plan: DG Shoulder Left, DG Clavicle Left, DG Lumbar Spine Complete ? ?Restless legs ? ?Complains of bruising on his back bruising in his shoulder difficulty moving the left shoulder relates pain and discomfort.  States he just lost his balance when he bent over and tried to stand up ?His COPD still the same ?Restless legs still bothersome but is willing to try Lyrica ? ?Review of Systems ? ?   ?Objective:  ? Physical Exam ?Contusions noted on the mid and lower back tenderness in the lower back able to walk has difficult time raising left shoulder but does have pain and discomfort on palpation ? ? ? ?   ?Assessment & Plan:  ? ?1. Acute pain of left shoulder ?Discomfort left shoulder ?Range of motion exercises to try ?Home physical therapy ?Orthopedic consult ?Possible torn rotator cuff ?- DG Shoulder Left ?- DG Clavicle Left ?- DG Lumbar Spine Complete ? ?2. Pain of left clavicle ?We will x-ray ?- DG Shoulder Left ?- DG Clavicle Left ?- DG Lumbar Spine Complete ? ?3. Lumbar pain ?Bruising will x-ray ?- DG Shoulder Left ?- DG Clavicle Left ?- DG Lumbar Spine Complete ? ?4. Fall, initial encounter ?Balance discussion held uses walker yes assist ?- DG Shoulder Left ?- DG Clavicle Left ?- DG Lumbar Spine Complete ? ?5. Contusion of lower back, initial  encounter ?Contusion bruising noted ?- DG Shoulder Left ?- DG Clavicle Left ?- DG Lumbar Spine Complete ? ?6. Restless legs ?Switch to Lyrica stop Requip let us know if it is helping ?Follow-up in 2 months ?

## 2022-02-15 NOTE — Progress Notes (Signed)
Referrals placed in Epic ?

## 2022-02-15 NOTE — Addendum Note (Signed)
Addended by: Vicente Males on: 02/15/2022 09:25 AM ? ? Modules accepted: Orders ? ?

## 2022-02-16 ENCOUNTER — Telehealth: Payer: Self-pay

## 2022-02-16 ENCOUNTER — Other Ambulatory Visit: Payer: Self-pay | Admitting: Family Medicine

## 2022-02-16 DIAGNOSIS — S46009A Unspecified injury of muscle(s) and tendon(s) of the rotator cuff of unspecified shoulder, initial encounter: Secondary | ICD-10-CM

## 2022-02-16 DIAGNOSIS — W19XXXA Unspecified fall, initial encounter: Secondary | ICD-10-CM

## 2022-02-16 DIAGNOSIS — M25512 Pain in left shoulder: Secondary | ICD-10-CM

## 2022-02-16 NOTE — Telephone Encounter (Signed)
Caller name:Gerald Hall  ? ?On DPR? :Yes ? ?Call back number:[phone] ? ?Provider they see: Wolfgang Phoenix  ? ?Reason for call:Pt is calling back about test results  ? ?

## 2022-02-16 NOTE — Telephone Encounter (Signed)
Result notes discussed with patient. See result note ?

## 2022-02-16 NOTE — Telephone Encounter (Signed)
Pt is calling back

## 2022-02-21 DIAGNOSIS — J449 Chronic obstructive pulmonary disease, unspecified: Secondary | ICD-10-CM | POA: Diagnosis not present

## 2022-02-22 ENCOUNTER — Telehealth: Payer: Self-pay | Admitting: Family Medicine

## 2022-02-22 NOTE — Telephone Encounter (Signed)
Pt returned call and states that he has already had a wellness visit done in the home. Pt declines at this time. ?

## 2022-02-22 NOTE — Telephone Encounter (Signed)
Left message for patient to call back and schedule Medicare Annual Wellness Visit (AWV) in office.  ? ?If unable to come into the office for AWV,  please offer to do virtually or by telephone. ? ?Last AWV: 02/10/2021 ? ?Please schedule at anytime with [facility]. ? ?30 minute appointment for Virtual or phone ? ?45 minute appointment for in office or Initial virtual/phone ? ?Any questions, please contact me at [phone]  ?  ?  ?

## 2022-02-24 ENCOUNTER — Other Ambulatory Visit: Payer: Self-pay | Admitting: Family Medicine

## 2022-02-24 DIAGNOSIS — Z7982 Long term (current) use of aspirin: Secondary | ICD-10-CM | POA: Diagnosis not present

## 2022-02-24 DIAGNOSIS — M5136 Other intervertebral disc degeneration, lumbar region: Secondary | ICD-10-CM | POA: Diagnosis not present

## 2022-02-24 DIAGNOSIS — N1831 Chronic kidney disease, stage 3a: Secondary | ICD-10-CM | POA: Diagnosis not present

## 2022-02-24 DIAGNOSIS — F17210 Nicotine dependence, cigarettes, uncomplicated: Secondary | ICD-10-CM | POA: Diagnosis not present

## 2022-02-24 DIAGNOSIS — S301XXD Contusion of abdominal wall, subsequent encounter: Secondary | ICD-10-CM | POA: Diagnosis not present

## 2022-02-24 DIAGNOSIS — Z9981 Dependence on supplemental oxygen: Secondary | ICD-10-CM | POA: Diagnosis not present

## 2022-02-24 DIAGNOSIS — W19XXXD Unspecified fall, subsequent encounter: Secondary | ICD-10-CM | POA: Diagnosis not present

## 2022-02-24 DIAGNOSIS — Z9181 History of falling: Secondary | ICD-10-CM | POA: Diagnosis not present

## 2022-02-24 DIAGNOSIS — E785 Hyperlipidemia, unspecified: Secondary | ICD-10-CM | POA: Diagnosis not present

## 2022-02-24 DIAGNOSIS — J432 Centrilobular emphysema: Secondary | ICD-10-CM | POA: Diagnosis not present

## 2022-02-24 DIAGNOSIS — Z7951 Long term (current) use of inhaled steroids: Secondary | ICD-10-CM | POA: Diagnosis not present

## 2022-02-24 DIAGNOSIS — E876 Hypokalemia: Secondary | ICD-10-CM | POA: Diagnosis not present

## 2022-02-24 DIAGNOSIS — S46002D Unspecified injury of muscle(s) and tendon(s) of the rotator cuff of left shoulder, subsequent encounter: Secondary | ICD-10-CM | POA: Diagnosis not present

## 2022-02-24 DIAGNOSIS — G4700 Insomnia, unspecified: Secondary | ICD-10-CM | POA: Diagnosis not present

## 2022-02-24 DIAGNOSIS — M8580 Other specified disorders of bone density and structure, unspecified site: Secondary | ICD-10-CM | POA: Diagnosis not present

## 2022-02-24 DIAGNOSIS — I471 Supraventricular tachycardia: Secondary | ICD-10-CM | POA: Diagnosis not present

## 2022-02-24 DIAGNOSIS — G2581 Restless legs syndrome: Secondary | ICD-10-CM | POA: Diagnosis not present

## 2022-02-24 DIAGNOSIS — Z79891 Long term (current) use of opiate analgesic: Secondary | ICD-10-CM | POA: Diagnosis not present

## 2022-02-24 DIAGNOSIS — I129 Hypertensive chronic kidney disease with stage 1 through stage 4 chronic kidney disease, or unspecified chronic kidney disease: Secondary | ICD-10-CM | POA: Diagnosis not present

## 2022-02-24 DIAGNOSIS — M48061 Spinal stenosis, lumbar region without neurogenic claudication: Secondary | ICD-10-CM | POA: Diagnosis not present

## 2022-02-24 DIAGNOSIS — M19012 Primary osteoarthritis, left shoulder: Secondary | ICD-10-CM | POA: Diagnosis not present

## 2022-02-24 DIAGNOSIS — M4802 Spinal stenosis, cervical region: Secondary | ICD-10-CM | POA: Diagnosis not present

## 2022-02-24 DIAGNOSIS — H3530 Unspecified macular degeneration: Secondary | ICD-10-CM | POA: Diagnosis not present

## 2022-02-25 ENCOUNTER — Other Ambulatory Visit: Payer: Self-pay | Admitting: "Endocrinology

## 2022-02-26 ENCOUNTER — Telehealth: Payer: Medicare Other

## 2022-02-26 ENCOUNTER — Encounter: Payer: Self-pay | Admitting: Surgical

## 2022-02-26 ENCOUNTER — Ambulatory Visit (INDEPENDENT_AMBULATORY_CARE_PROVIDER_SITE_OTHER): Payer: Medicare Other | Admitting: Surgical

## 2022-02-26 DIAGNOSIS — R29898 Other symptoms and signs involving the musculoskeletal system: Secondary | ICD-10-CM

## 2022-02-26 DIAGNOSIS — M25512 Pain in left shoulder: Secondary | ICD-10-CM

## 2022-02-26 DIAGNOSIS — M19012 Primary osteoarthritis, left shoulder: Secondary | ICD-10-CM

## 2022-02-26 DIAGNOSIS — M545 Low back pain, unspecified: Secondary | ICD-10-CM | POA: Diagnosis not present

## 2022-02-26 MED ORDER — BUPIVACAINE HCL 0.5 % IJ SOLN
9.0000 mL | INTRAMUSCULAR | Status: AC | PRN
  Administered 2022-02-26: 9 mL via INTRA_ARTICULAR

## 2022-02-26 MED ORDER — METHYLPREDNISOLONE ACETATE 40 MG/ML IJ SUSP
40.0000 mg | INTRAMUSCULAR | Status: AC | PRN
  Administered 2022-02-26: 40 mg via INTRA_ARTICULAR

## 2022-02-26 MED ORDER — LIDOCAINE HCL 1 % IJ SOLN
5.0000 mL | INTRAMUSCULAR | Status: AC | PRN
  Administered 2022-02-26: 5 mL

## 2022-02-26 NOTE — Progress Notes (Signed)
? ?Office Visit Note ?  ?Patient: [name]           ?Date of Birth: Apr 09, 1935           ?MRN: [id number] ?Visit Date: 02/26/2022 ?Requested by: Kathyrn Drown, MD ?[address] ?[address] ?[place],  [place] [address] ?PCP: Kathyrn Drown, MD ? ?Subjective: ?Chief Complaint  ?Patient presents with  ? Shoulder Pain  ? Back Pain  ? ? ?HPI: Gerald Hall is a 87 y.o. male who presents to the office complaining of left shoulder and low back pain.  He had a fall about a month ago where he lost his balance and fell backwards, hitting his lower back into a dining table behind him.  Ever since then he has experienced increased low back pain along with new radicular pain down bilateral legs.  He was not having any radicular pain down either leg prior to the injury.  He notes subjective weakness of both legs but denies any red flag symptoms such as bowel/bladder incontinence or saddle anesthesia.  Pain wakes him up at night.  He is taking hydrocodone that he got from a friend for the pain control.  Ambulates with a walker.  He does have history of prior L-spine and C-spine surgery but cannot recall who his surgeon was or the exact procedure performed. ? ?Additionally, since the fall he complains of left shoulder pain and weakness.  He denies any history of prior injury to the shoulder or any history of surgery to the shoulder.  He did not have any pain in the shoulder prior to the fall.  Does not really remember how he hurt his shoulder.  He has difficulty AB ducting his arm to the side due to a combination of pain and weakness.  Localizes most of his pain to the axillary and anterior aspect of the left shoulder.  No increased neck pain.  No radiation of pain down the arm.  No numbness or tingling..   ?             ?ROS: All systems reviewed are negative as they relate to the chief complaint within the history of present illness.  Patient denies fevers or chills. ? ?Assessment & Plan: ?Visit Diagnoses:  ?1. Shoulder  weakness   ?2. Acute pain of left shoulder   ?3. Low back pain, unspecified back pain laterality, unspecified chronicity, unspecified whether sciatica present   ? ? ?Plan: Patient is an 87 year old male who presents for evaluation of left shoulder pain and low back pain with radicular pain.  He has new onset of shoulder pain and weakness since a fall about 1 month ago where he fell backwards, hitting his back into a dining room table.  He has radiographs demonstrating no acute fracture or dislocation but he does have moderate glenohumeral and acromioclavicular arthritis.  On exam today, he has significant rotator cuff weakness with difficulty AB ducting his arm actively which is new for him.  Discussed the options available to patient regarding his shoulder.  He can try an injection today which may help with the pain but will likely not help with his weakness which is likely due to a massive rotator cuff tear.  He would like to try injection and obtain MRI scan for further evaluation.  Options will likely come down to either living with the shoulder as is with occasional injections versus pursuing reverse shoulder arthroplasty and this was discussed with the patient.  He wants to see  how the injection does and follow-up after MRI to discuss further options. ? ?Low back pain is giving him a lot more trouble than the shoulder.  No red flag symptoms on exam or by history today but he does have new radicular pain ever since he hit his lumbar spine into the table on his fall.  No such pain prior to the injury.  Increased low back pain that is continuing to wake him up at night.  No significant improvement since the injury.  Plan is order MRI of the lumbar spine for further evaluation of increased low back pain with new radicular pain.  No weakness on exam today.  Follow-up after both MRI scans to review results.  Anticipate likely ESI's to follow based on MRI result. ? ?Follow-Up Instructions: No follow-ups on file.   ? ?Orders:  ?Orders Placed This Encounter  ?Procedures  ? MR Shoulder Left w/o contrast  ? MR Lumbar Spine w/o contrast  ? ?No orders of the defined types were placed in this encounter. ? ? ? ? Procedures: ?Large Joint Inj: L glenohumeral on 02/26/2022 3:03 PM ?Indications: diagnostic evaluation and pain ?Details: 18 G 1.5 in needle, posterior approach ? ?Arthrogram: No ? ?Medications: 9 mL bupivacaine 0.5 %; 40 mg methylPREDNISolone acetate 40 MG/ML; 5 mL lidocaine 1 % ?Outcome: tolerated well, no immediate complications ?Procedure, treatment alternatives, risks and benefits explained, specific risks discussed. Consent was given by the patient. Immediately prior to procedure a time out was called to verify the correct patient, procedure, equipment, support staff and site/side marked as required. Patient was prepped and draped in the usual sterile fashion.  ? ? ? ? ?Clinical Data: ?No additional findings. ? ?Objective: ?Vital Signs: There were no vitals taken for this visit. ? ?Physical Exam:  ?Constitutional: Patient appears well-developed ?HEENT:  ?Head: Normocephalic ?Eyes:EOM are normal ?Neck: Normal range of motion ?Cardiovascular: Normal rate ?Pulmonary/chest: Effort normal ?Neurologic: Patient is alert ?Skin: Skin is warm ?Psychiatric: Patient has normal mood and affect ? ?Ortho Exam: Ortho exam demonstrates left shoulder with 40 degrees external rotation, 90 degrees abduction, 130 degrees forward flexion passively.  Active abduction to 50 degrees with great exertion.  5/5 motor strength of bilateral grip strength, finger abduction, pronation/supination, bicep, tricep.  Significant weakness of external rotation of the left shoulder compared with the right as well as weakness of supraspinatus compared with the right side.  Supraspinatus strength rated 4/5.  External rotation strength rated 3/5.  Positive external rotation lag sign.  Positive Hornblower sign.  Excellent subscapularis  strength. ? ?Bruising/ecchymosis noted in the left flank.  Moderate tenderness throughout the axial lumbar spine diffusely throughout.  +5 motor strength of bilateral hip flexion, quadricep, dorsiflexion.  5 -/5 hamstring and plantarflexion strength of the left side compared with 5/5 on the right.  2+ patellar tendon reflexes bilaterally.  Negative straight leg raise bilaterally. ? ?Specialty Comments:  ?No specialty comments available. ? ?Imaging: ?No results found. ? ? ?PMFS History: ?Patient Active Problem List  ? Diagnosis Date Noted  ? Stage 3a chronic kidney disease ([place]) 11/28/2021  ? COPD with exacerbation ([place]) 11/22/2021  ? COPD with acute exacerbation ([place]) 11/20/2021  ? Hypokalemia 11/20/2021  ? Acute left-sided low back pain without sciatica 10/15/2021  ? Centrilobular emphysema ([place]) 11/12/2020  ? Secondary cardiomyopathy ([place]) 11/12/2020  ? Subacute thyroiditis 09/25/2020  ? Primary osteoarthritis involving multiple joints 02/21/2017  ? Smoker 02/21/2017  ? AMD (age-related macular degeneration), bilateral 11/10/2016  ? Insomnia 07/20/2016  ?  Restless legs 07/20/2016  ? Spinal stenosis in cervical region 10/14/2015  ? Senile purpura ([place]) 04/18/2015  ? Osteopenia 01/28/2015  ? Spinal stenosis of lumbar region 01/20/2015  ? BPH (benign prostatic hyperplasia) 01/20/2015  ? HTN (hypertension), benign 04/17/2014  ? Lumbar disc disease 03/05/2014  ? ?Past Medical History:  ?Diagnosis Date  ? Arthritis   ? Balance problem   ? Uses a walker  ? Blood in urine   ? Sees urology yearly  ? Deafness in right ear age 57  ? Hyperlipidemia   ? Hypertension   ? PSVT (paroxysmal supraventricular tachycardia) ([place])   ? Temporal arteritis ([place]) 2016  ? Urinary incontinence   ?  ?Family History  ?Problem Relation Age of Onset  ? Colon cancer Brother   ? Diabetes Brother   ? Diabetes Brother   ?  ?Past Surgical History:  ?Procedure Laterality Date  ? BACK SURGERY  2010  ? [facility]    ? CATARACT EXTRACTION  Bilateral 07/30/14, 08/13/2014  ? CERVICAL SPINE SURGERY  09/01/15  ? C 4-5 , Needles  ? COLONOSCOPY  12/19/2007  ? RMR: 1. External hemorrohids, otherwise normal rectum.2. Normal colon. 3. Normal terminal ileum.  ? COLONOSCOPY

## 2022-03-01 DIAGNOSIS — E0590 Thyrotoxicosis, unspecified without thyrotoxic crisis or storm: Secondary | ICD-10-CM | POA: Diagnosis not present

## 2022-03-02 ENCOUNTER — Telehealth: Payer: Self-pay | Admitting: Family Medicine

## 2022-03-02 LAB — TSH: TSH: 0.186 u[IU]/mL — ABNORMAL LOW (ref 0.450–4.500)

## 2022-03-02 LAB — T4, FREE: Free T4: 1.71 ng/dL (ref 0.82–1.77)

## 2022-03-02 LAB — T3, FREE: T3, Free: 2.3 pg/mL (ref 2.0–4.4)

## 2022-03-02 NOTE — Telephone Encounter (Signed)
Patient is requesting to have MRI for his back pain at [facility] because its two weeks out and states cant wait that long . Please advise ?

## 2022-03-02 NOTE — Telephone Encounter (Signed)
Please advise. Thank you

## 2022-03-02 NOTE — Telephone Encounter (Signed)
I certainly understand his desire to have at local, I am very sympathetic with his situation ?this MRI was approved by the insurance company after the specialist ?Ordered the test ?Therefore the authorization is under the specialist name.  Specialist have the ability to get these type of MRIs approved quickly whereas for primary care it would require 6 to 8 weeks of physical therapy first ?Therefore based upon this I would stick with the MRI that the specialist ordered ?Secondly the specialist can transfer the MRI to [facility] if they so desire.[facility] is part of [facility] so therefore a specialist who can order tests at [facility] health can also order them at [facility] ?I am uncertain if MRIs could get done any quicker at [facility] again he will have to go through the specialist thank you ?

## 2022-03-03 ENCOUNTER — Ambulatory Visit: Payer: Medicare Other | Admitting: Family Medicine

## 2022-03-03 DIAGNOSIS — M8580 Other specified disorders of bone density and structure, unspecified site: Secondary | ICD-10-CM | POA: Diagnosis not present

## 2022-03-03 DIAGNOSIS — F17210 Nicotine dependence, cigarettes, uncomplicated: Secondary | ICD-10-CM | POA: Diagnosis not present

## 2022-03-03 DIAGNOSIS — J432 Centrilobular emphysema: Secondary | ICD-10-CM | POA: Diagnosis not present

## 2022-03-03 DIAGNOSIS — Z7982 Long term (current) use of aspirin: Secondary | ICD-10-CM | POA: Diagnosis not present

## 2022-03-03 DIAGNOSIS — S46002D Unspecified injury of muscle(s) and tendon(s) of the rotator cuff of left shoulder, subsequent encounter: Secondary | ICD-10-CM | POA: Diagnosis not present

## 2022-03-03 DIAGNOSIS — S301XXD Contusion of abdominal wall, subsequent encounter: Secondary | ICD-10-CM | POA: Diagnosis not present

## 2022-03-03 DIAGNOSIS — G4700 Insomnia, unspecified: Secondary | ICD-10-CM | POA: Diagnosis not present

## 2022-03-03 DIAGNOSIS — M5136 Other intervertebral disc degeneration, lumbar region: Secondary | ICD-10-CM | POA: Diagnosis not present

## 2022-03-03 DIAGNOSIS — I129 Hypertensive chronic kidney disease with stage 1 through stage 4 chronic kidney disease, or unspecified chronic kidney disease: Secondary | ICD-10-CM | POA: Diagnosis not present

## 2022-03-03 DIAGNOSIS — N1831 Chronic kidney disease, stage 3a: Secondary | ICD-10-CM | POA: Diagnosis not present

## 2022-03-03 DIAGNOSIS — W19XXXD Unspecified fall, subsequent encounter: Secondary | ICD-10-CM | POA: Diagnosis not present

## 2022-03-03 DIAGNOSIS — Z7951 Long term (current) use of inhaled steroids: Secondary | ICD-10-CM | POA: Diagnosis not present

## 2022-03-03 DIAGNOSIS — M48061 Spinal stenosis, lumbar region without neurogenic claudication: Secondary | ICD-10-CM | POA: Diagnosis not present

## 2022-03-03 DIAGNOSIS — G2581 Restless legs syndrome: Secondary | ICD-10-CM | POA: Diagnosis not present

## 2022-03-03 DIAGNOSIS — Z79891 Long term (current) use of opiate analgesic: Secondary | ICD-10-CM | POA: Diagnosis not present

## 2022-03-03 DIAGNOSIS — M19012 Primary osteoarthritis, left shoulder: Secondary | ICD-10-CM | POA: Diagnosis not present

## 2022-03-03 DIAGNOSIS — H3530 Unspecified macular degeneration: Secondary | ICD-10-CM | POA: Diagnosis not present

## 2022-03-03 DIAGNOSIS — E785 Hyperlipidemia, unspecified: Secondary | ICD-10-CM | POA: Diagnosis not present

## 2022-03-03 DIAGNOSIS — I471 Supraventricular tachycardia: Secondary | ICD-10-CM | POA: Diagnosis not present

## 2022-03-03 DIAGNOSIS — E876 Hypokalemia: Secondary | ICD-10-CM | POA: Diagnosis not present

## 2022-03-03 DIAGNOSIS — Z9181 History of falling: Secondary | ICD-10-CM | POA: Diagnosis not present

## 2022-03-03 DIAGNOSIS — Z9981 Dependence on supplemental oxygen: Secondary | ICD-10-CM | POA: Diagnosis not present

## 2022-03-03 DIAGNOSIS — M4802 Spinal stenosis, cervical region: Secondary | ICD-10-CM | POA: Diagnosis not present

## 2022-03-03 NOTE — Telephone Encounter (Signed)
Patient notified of provider's recommendations and verbalized understanding.  

## 2022-03-04 ENCOUNTER — Encounter: Payer: Self-pay | Admitting: "Endocrinology

## 2022-03-04 ENCOUNTER — Ambulatory Visit: Payer: Medicare Other | Admitting: "Endocrinology

## 2022-03-04 VITALS — BP 140/78 | HR 60 | Ht 68.0 in | Wt 202.8 lb

## 2022-03-04 DIAGNOSIS — F17200 Nicotine dependence, unspecified, uncomplicated: Secondary | ICD-10-CM

## 2022-03-04 DIAGNOSIS — E0590 Thyrotoxicosis, unspecified without thyrotoxic crisis or storm: Secondary | ICD-10-CM | POA: Diagnosis not present

## 2022-03-04 MED ORDER — METHIMAZOLE 5 MG PO TABS: 5.0000 mg | ORAL_TABLET | Freq: Every day | ORAL | 0 refills | Status: DC

## 2022-03-04 NOTE — Progress Notes (Signed)
? ?     ?                                           03/04/2022, 1:35 PM ? ?Endocrinology follow-up note ? ? ?Subjective:  ? ? Patient ID: Gerald Hall, male    DOB: 1935-05-31, PCP Gerald Drown, MD ? ? ?Past Medical History:  ?Diagnosis Date  ? Arthritis   ? Balance problem   ? Uses a walker  ? Blood in urine   ? Sees urology yearly  ? Deafness in right ear age 87  ? Hyperlipidemia   ? Hypertension   ? PSVT (paroxysmal supraventricular tachycardia) ([place])   ? Temporal arteritis ([place]) 2016  ? Urinary incontinence   ? ?Past Surgical History:  ?Procedure Laterality Date  ? BACK SURGERY  2010  ? [place]    ? CATARACT EXTRACTION Bilateral 07/30/14, 08/13/2014  ? CERVICAL SPINE SURGERY  09/01/15  ? C 4-5 , [place]  ? COLONOSCOPY  12/19/2007  ? RMR: 1. External hemorrohids, otherwise normal rectum.2. Normal colon. 3. Normal terminal ileum.  ? COLONOSCOPY N/A 12/13/2014  ? Procedure: COLONOSCOPY;  Surgeon: Daneil Dolin, MD;  [facility];  Service: Endoscopy;  Laterality: N/A;  11:15 Pt Request Time  ? KNEE SURGERY Left   ? around 2000, arthroscopic  ? TOTAL HIP ARTHROPLASTY Right 02/23/2017  ? Procedure: TOTAL HIP ARTHROPLASTY ANTERIOR APPROACH;  Surgeon: Hessie Knows, MD;  Location: [facility] ORS;  Service: Orthopedics;  Laterality: Right;  ? ?Social History  ? ?Socioeconomic History  ? Marital status: Married  ?  Spouse name: Pamala Hurry  ? Number of children: 2  ? Years of education: 41  ? Highest education level: Not on file  ?Occupational History  ?  Comment: retired Software engineer  ?Tobacco Use  ? Smoking status: Every Day  ?  Packs/day: 0.50  ?  Types: Cigarettes  ? Smokeless tobacco: Never  ? Tobacco comments:  ?  08/18/15 1/2 - 3/4 pack cigarettes daily  ?Vaping Use  ? Vaping Use: Never used  ?Substance and Sexual Activity  ? Alcohol use: Yes  ?  Alcohol/week: 0.0 standard drinks  ?  Comment: mixed drink 1-2 times per month, social  ? Drug use: No  ? Sexual activity: Not on file   ?Other Topics Concern  ? Not on file  ?Social History Narrative  ? Lives at home with wife, who has PD  ? Caffeine use- coffee 8-10 cups daily  ? ?Social Determinants of Health  ? ?Financial Resource Strain: Not on file  ?Food Insecurity: Not on file  ?Transportation Needs: Not on file  ?Physical Activity: Not on file  ?Stress: Not on file  ?Social Connections: Not on file  ? ?Family History  ?Problem Relation Age of Onset  ? Colon cancer Brother   ? Diabetes Brother   ? Diabetes Brother   ? ?Outpatient Encounter Medications as of 03/04/2022  ?Medication Sig  ? acetaminophen (TYLENOL) 325 MG tablet Take 1,000 mg by mouth every 6 (six) hours as needed for mild pain or headache.  ? albuterol (VENTOLIN HFA) 108 (90 Base) MCG/ACT inhaler Inhale 2 puffs into the lungs every 4 (four) hours as needed for wheezing.  ? amiodarone (PACERONE) 200 MG tablet TAKE 1 TABLET BY MOUTH ONCE DAILY.  ? aspirin EC 325 MG EC tablet Take 1  tablet (325 mg total) by mouth daily.  ? calcium carbonate (OS-CAL) 600 MG TABS tablet Take 600 mg by mouth daily with breakfast.  ? carvedilol (COREG) 3.125 MG tablet TAKE (1) TABLET BY MOUTH TWICE A DAY WITH MEALS (BREAKFAST AND SUPPER)  ? cholecalciferol (VITAMIN D) 400 units TABS tablet Take 1,000 Units by mouth daily.  ? Dextromethorphan-guaiFENesin (MUCINEX DM) 30-600 MG TB12 Take 1 tablet by mouth daily.   ? finasteride (PROSCAR) 5 MG tablet TAKE 1 TABLET BY MOUTH ONCE DAILY.  ? fluticasone (FLONASE) 50 MCG/ACT nasal spray Place 2 sprays into both nostrils at bedtime.  ? hydrochlorothiazide (HYDRODIURIL) 12.5 MG tablet TAKE 1 TABLET BY MOUTH IN THE MORNING.  ? methimazole (TAPAZOLE) 5 MG tablet TAKE 1 TABLET BY MOUTH ONCE DAILY.  ? mirabegron ER (MYRBETRIQ) 50 MG TB24 tablet Take 50 mg by mouth daily.  ? OXYGEN Inhale 2 L into the lungs continuous.  ? oxymetazoline (AFRIN) 0.05 % nasal spray Place 1 spray into both nostrils at bedtime.  ? polyethylene glycol (MIRALAX / GLYCOLAX) 17 g packet Take  17 g by mouth daily as needed.  ? polyvinyl alcohol (LIQUIFILM TEARS) 1.4 % ophthalmic solution Place 1 drop into both eyes as needed for dry eyes.  ? potassium chloride (KLOR-CON) 10 MEQ tablet TAKE 1 TABLET BY MOUTH TWICE DAILY  ? potassium chloride (KLOR-CON) 10 MEQ tablet TAKE 1 TABLET BY MOUTH TWICE DAILY  ? pregabalin (LYRICA) 25 MG capsule Take 1 capsule (25 mg total) by mouth 2 (two) times daily.  ? rOPINIRole (REQUIP) 2 MG tablet TAKE 1 TABLET BY MOUTH AT LATE AFTERNOON AND AT BEDTIME (Patient taking differently: Take 2 mg by mouth at bedtime.)  ? sodium chloride (OCEAN) 0.65 % SOLN nasal spray Place 1 spray into both nostrils as needed for congestion. (Patient not taking: Reported on 02/12/2022)  ? SYMBICORT 160-4.5 MCG/ACT inhaler INHALE 2 PUFFS BY MOUTH TWICE DAILY  ? tamsulosin (FLOMAX) 0.4 MG CAPS capsule TAKE (1) CAPSULE BY MOUTH TWICE DAILY.  ? traMADol (ULTRAM) 50 MG tablet TAKE (1) TABLET BY MOUTH EVERY TWELVE HOURS AS NEEDED. (Patient taking differently: Take 50 mg by mouth 2 (two) times daily as needed for moderate pain.)  ? vitamin C (ASCORBIC ACID) 500 MG tablet Take 500 mg by mouth daily.  ? Zinc Acetate, Oral, (ZINC ACETATE PO) Take 1 tablet by mouth daily.  ? [DISCONTINUED] ipratropium-albuterol (DUONEB) 0.5-2.5 (3) MG/3ML SOLN Take 3 mLs by nebulization every 6 (six) hours for 9 days. Will have PCP re-assess to see if need to continue long-term  ? ?No facility-administered encounter medications on file as of 03/04/2022.  ? ?ALLERGIES: ?Allergies  ?Allergen Reactions  ? Cephalexin Diarrhea  ?  And general weakness  ? Penicillins   ?  GI UPSET   ? ? ?VACCINATION STATUS: ?Immunization History  ?Administered Date(s) Administered  ? Fluad Quad(high Dose 65+) 08/12/2020, 08/10/2021  ? Influenza,inj,Quad PF,6+ Mos 07/20/2016, 08/07/2018, 08/22/2019  ? Influenza-Unspecified 09/03/2013  ? Moderna Sars-Covid-2 Vaccination 10/30/2019, 11/27/2019, 08/28/2020  ? Pneumococcal Conjugate-13 09/03/2014  ?  Pneumococcal Polysaccharide-23 07/20/2016  ? Zoster Recombinat (Shingrix) 09/26/2017, 02/23/2018  ? ? ?HPI ?Gerald Hall is 87 y.o. male who presents today with repeat thyroid function tests.  He was seen in consultation last year due to abnormal thyroid function tests consistent with transient thyrotoxicosis related to thyroiditis.  He remains on treatment with amiodarone due to cardiac dysrhythmia. ?ZOX:WRUEAV, Elayne Snare, MD.  ?He is not currently on low-dose methimazole 5 mg  p.o. daily.  His previsit labs are consistent with treatment effect.  He presents with symptomatic improvement clinically.  He denies palpitations, current pulse rate is 60. ? He is currently on 200 mg of amiodarone daily.   ?-  His thyroid uptake and scan on September 09, 2020 revealed low uptake of 1.3% in 24 hours. ?-His pre-visit labs are c/w significant hyperthyroidism. ? ?He remains active smoker.  He is currently being treated for squamous cell carcinoma of the skin over his left upper extremity.  He does not have acute complaints today. ? ?Review of Systems ?Limited as above. ? ?Objective:  ?  ? ?  03/04/2022  ?  1:27 PM 02/12/2022  ?  2:12 PM 12/25/2021  ? 12:06 PM  ?Vitals with BMI  ?Height '5\' 8"'$     ?Weight 202 lbs 13 oz 183 lbs   ?BMI 30.84    ?Systolic  177 116  ?Diastolic  69 72  ?Pulse  64   ? ? ?Ht '5\' 8"'$  (1.727 m)   Wt 202 lb 12.8 oz (92 kg)   BMI 30.84 kg/m?   ?Wt Readings from Last 3 Encounters:  ?03/04/22 202 lb 12.8 oz (92 kg)  ?02/12/22 183 lb (83 kg)  ?12/25/21 201 lb (91.2 kg)  ?  ?Physical Exam ? ?Constitutional:  Body mass index is 30.84 kg/m?.,  not in acute distress, normal state of mind, + relates with a walker. ?Eyes: PERRLA, EOMI, no exophthalmos ? ? ?CMP ( most recent) ?CMP  ?   ?Component Value Date/Time  ? NA 140 11/23/2021 0421  ? NA 140 07/16/2021 1105  ? K 4.1 11/23/2021 0421  ? CL 104 11/23/2021 0421  ? CO2 29 11/23/2021 0421  ? GLUCOSE 113 (H) 11/23/2021 0421  ? BUN 36 (H) 11/23/2021 0421  ? BUN 30 (H)  07/16/2021 1105  ? CREATININE 1.36 (H) 11/23/2021 0421  ? CREATININE 1.14 09/06/2014 1259  ? CALCIUM 8.9 11/23/2021 0421  ? PROT 6.8 11/21/2021 0445  ? PROT 6.6 07/16/2021 1105  ? ALBUMIN 3.5 11/21/2021 0445  ? ALBU

## 2022-03-05 DIAGNOSIS — I471 Supraventricular tachycardia: Secondary | ICD-10-CM | POA: Diagnosis not present

## 2022-03-05 DIAGNOSIS — Z79891 Long term (current) use of opiate analgesic: Secondary | ICD-10-CM | POA: Diagnosis not present

## 2022-03-05 DIAGNOSIS — S46002D Unspecified injury of muscle(s) and tendon(s) of the rotator cuff of left shoulder, subsequent encounter: Secondary | ICD-10-CM | POA: Diagnosis not present

## 2022-03-05 DIAGNOSIS — W19XXXD Unspecified fall, subsequent encounter: Secondary | ICD-10-CM | POA: Diagnosis not present

## 2022-03-05 DIAGNOSIS — M4802 Spinal stenosis, cervical region: Secondary | ICD-10-CM | POA: Diagnosis not present

## 2022-03-05 DIAGNOSIS — I129 Hypertensive chronic kidney disease with stage 1 through stage 4 chronic kidney disease, or unspecified chronic kidney disease: Secondary | ICD-10-CM | POA: Diagnosis not present

## 2022-03-05 DIAGNOSIS — Z9981 Dependence on supplemental oxygen: Secondary | ICD-10-CM | POA: Diagnosis not present

## 2022-03-05 DIAGNOSIS — E785 Hyperlipidemia, unspecified: Secondary | ICD-10-CM | POA: Diagnosis not present

## 2022-03-05 DIAGNOSIS — Z7982 Long term (current) use of aspirin: Secondary | ICD-10-CM | POA: Diagnosis not present

## 2022-03-05 DIAGNOSIS — Z9181 History of falling: Secondary | ICD-10-CM | POA: Diagnosis not present

## 2022-03-05 DIAGNOSIS — Z7951 Long term (current) use of inhaled steroids: Secondary | ICD-10-CM | POA: Diagnosis not present

## 2022-03-05 DIAGNOSIS — E876 Hypokalemia: Secondary | ICD-10-CM | POA: Diagnosis not present

## 2022-03-05 DIAGNOSIS — N1831 Chronic kidney disease, stage 3a: Secondary | ICD-10-CM | POA: Diagnosis not present

## 2022-03-05 DIAGNOSIS — J432 Centrilobular emphysema: Secondary | ICD-10-CM | POA: Diagnosis not present

## 2022-03-05 DIAGNOSIS — H3530 Unspecified macular degeneration: Secondary | ICD-10-CM | POA: Diagnosis not present

## 2022-03-05 DIAGNOSIS — F17210 Nicotine dependence, cigarettes, uncomplicated: Secondary | ICD-10-CM | POA: Diagnosis not present

## 2022-03-05 DIAGNOSIS — M19012 Primary osteoarthritis, left shoulder: Secondary | ICD-10-CM | POA: Diagnosis not present

## 2022-03-05 DIAGNOSIS — G4700 Insomnia, unspecified: Secondary | ICD-10-CM | POA: Diagnosis not present

## 2022-03-05 DIAGNOSIS — M8580 Other specified disorders of bone density and structure, unspecified site: Secondary | ICD-10-CM | POA: Diagnosis not present

## 2022-03-05 DIAGNOSIS — G2581 Restless legs syndrome: Secondary | ICD-10-CM | POA: Diagnosis not present

## 2022-03-05 DIAGNOSIS — S301XXD Contusion of abdominal wall, subsequent encounter: Secondary | ICD-10-CM | POA: Diagnosis not present

## 2022-03-05 DIAGNOSIS — M5136 Other intervertebral disc degeneration, lumbar region: Secondary | ICD-10-CM | POA: Diagnosis not present

## 2022-03-05 DIAGNOSIS — M48061 Spinal stenosis, lumbar region without neurogenic claudication: Secondary | ICD-10-CM | POA: Diagnosis not present

## 2022-03-10 ENCOUNTER — Other Ambulatory Visit: Payer: Self-pay | Admitting: Surgical

## 2022-03-10 ENCOUNTER — Telehealth: Payer: Self-pay | Admitting: Surgical

## 2022-03-10 DIAGNOSIS — Z79891 Long term (current) use of opiate analgesic: Secondary | ICD-10-CM | POA: Diagnosis not present

## 2022-03-10 DIAGNOSIS — Z7951 Long term (current) use of inhaled steroids: Secondary | ICD-10-CM | POA: Diagnosis not present

## 2022-03-10 DIAGNOSIS — J432 Centrilobular emphysema: Secondary | ICD-10-CM | POA: Diagnosis not present

## 2022-03-10 DIAGNOSIS — N1831 Chronic kidney disease, stage 3a: Secondary | ICD-10-CM | POA: Diagnosis not present

## 2022-03-10 DIAGNOSIS — M19012 Primary osteoarthritis, left shoulder: Secondary | ICD-10-CM | POA: Diagnosis not present

## 2022-03-10 DIAGNOSIS — W19XXXD Unspecified fall, subsequent encounter: Secondary | ICD-10-CM | POA: Diagnosis not present

## 2022-03-10 DIAGNOSIS — M4802 Spinal stenosis, cervical region: Secondary | ICD-10-CM | POA: Diagnosis not present

## 2022-03-10 DIAGNOSIS — H3530 Unspecified macular degeneration: Secondary | ICD-10-CM | POA: Diagnosis not present

## 2022-03-10 DIAGNOSIS — E785 Hyperlipidemia, unspecified: Secondary | ICD-10-CM | POA: Diagnosis not present

## 2022-03-10 DIAGNOSIS — Z7982 Long term (current) use of aspirin: Secondary | ICD-10-CM | POA: Diagnosis not present

## 2022-03-10 DIAGNOSIS — I471 Supraventricular tachycardia: Secondary | ICD-10-CM | POA: Diagnosis not present

## 2022-03-10 DIAGNOSIS — Z9181 History of falling: Secondary | ICD-10-CM | POA: Diagnosis not present

## 2022-03-10 DIAGNOSIS — M8580 Other specified disorders of bone density and structure, unspecified site: Secondary | ICD-10-CM | POA: Diagnosis not present

## 2022-03-10 DIAGNOSIS — I129 Hypertensive chronic kidney disease with stage 1 through stage 4 chronic kidney disease, or unspecified chronic kidney disease: Secondary | ICD-10-CM | POA: Diagnosis not present

## 2022-03-10 DIAGNOSIS — F17210 Nicotine dependence, cigarettes, uncomplicated: Secondary | ICD-10-CM | POA: Diagnosis not present

## 2022-03-10 DIAGNOSIS — S46002D Unspecified injury of muscle(s) and tendon(s) of the rotator cuff of left shoulder, subsequent encounter: Secondary | ICD-10-CM | POA: Diagnosis not present

## 2022-03-10 DIAGNOSIS — G2581 Restless legs syndrome: Secondary | ICD-10-CM | POA: Diagnosis not present

## 2022-03-10 DIAGNOSIS — E876 Hypokalemia: Secondary | ICD-10-CM | POA: Diagnosis not present

## 2022-03-10 DIAGNOSIS — G4700 Insomnia, unspecified: Secondary | ICD-10-CM | POA: Diagnosis not present

## 2022-03-10 DIAGNOSIS — Z9981 Dependence on supplemental oxygen: Secondary | ICD-10-CM | POA: Diagnosis not present

## 2022-03-10 DIAGNOSIS — S301XXD Contusion of abdominal wall, subsequent encounter: Secondary | ICD-10-CM | POA: Diagnosis not present

## 2022-03-10 DIAGNOSIS — M5136 Other intervertebral disc degeneration, lumbar region: Secondary | ICD-10-CM | POA: Diagnosis not present

## 2022-03-10 DIAGNOSIS — M48061 Spinal stenosis, lumbar region without neurogenic claudication: Secondary | ICD-10-CM | POA: Diagnosis not present

## 2022-03-10 NOTE — Telephone Encounter (Signed)
He is already taking Tramadol from Dr Wolfgang Phoenix, is he taking that still and is that not helping?

## 2022-03-10 NOTE — Telephone Encounter (Signed)
Pt called requesting pain medication. Please send to [facility]. Please call pt when meds have be sent in. Pt phone number is [phone]. ?

## 2022-03-12 DIAGNOSIS — H3530 Unspecified macular degeneration: Secondary | ICD-10-CM | POA: Diagnosis not present

## 2022-03-12 DIAGNOSIS — M5136 Other intervertebral disc degeneration, lumbar region: Secondary | ICD-10-CM | POA: Diagnosis not present

## 2022-03-12 DIAGNOSIS — E785 Hyperlipidemia, unspecified: Secondary | ICD-10-CM | POA: Diagnosis not present

## 2022-03-12 DIAGNOSIS — Z79891 Long term (current) use of opiate analgesic: Secondary | ICD-10-CM | POA: Diagnosis not present

## 2022-03-12 DIAGNOSIS — M4802 Spinal stenosis, cervical region: Secondary | ICD-10-CM | POA: Diagnosis not present

## 2022-03-12 DIAGNOSIS — G4700 Insomnia, unspecified: Secondary | ICD-10-CM | POA: Diagnosis not present

## 2022-03-12 DIAGNOSIS — G2581 Restless legs syndrome: Secondary | ICD-10-CM | POA: Diagnosis not present

## 2022-03-12 DIAGNOSIS — F17210 Nicotine dependence, cigarettes, uncomplicated: Secondary | ICD-10-CM | POA: Diagnosis not present

## 2022-03-12 DIAGNOSIS — E876 Hypokalemia: Secondary | ICD-10-CM | POA: Diagnosis not present

## 2022-03-12 DIAGNOSIS — I471 Supraventricular tachycardia: Secondary | ICD-10-CM | POA: Diagnosis not present

## 2022-03-12 DIAGNOSIS — N1831 Chronic kidney disease, stage 3a: Secondary | ICD-10-CM | POA: Diagnosis not present

## 2022-03-12 DIAGNOSIS — W19XXXD Unspecified fall, subsequent encounter: Secondary | ICD-10-CM | POA: Diagnosis not present

## 2022-03-12 DIAGNOSIS — M8580 Other specified disorders of bone density and structure, unspecified site: Secondary | ICD-10-CM | POA: Diagnosis not present

## 2022-03-12 DIAGNOSIS — Z9981 Dependence on supplemental oxygen: Secondary | ICD-10-CM | POA: Diagnosis not present

## 2022-03-12 DIAGNOSIS — S301XXD Contusion of abdominal wall, subsequent encounter: Secondary | ICD-10-CM | POA: Diagnosis not present

## 2022-03-12 DIAGNOSIS — Z7951 Long term (current) use of inhaled steroids: Secondary | ICD-10-CM | POA: Diagnosis not present

## 2022-03-12 DIAGNOSIS — J432 Centrilobular emphysema: Secondary | ICD-10-CM | POA: Diagnosis not present

## 2022-03-12 DIAGNOSIS — I129 Hypertensive chronic kidney disease with stage 1 through stage 4 chronic kidney disease, or unspecified chronic kidney disease: Secondary | ICD-10-CM | POA: Diagnosis not present

## 2022-03-12 DIAGNOSIS — Z7982 Long term (current) use of aspirin: Secondary | ICD-10-CM | POA: Diagnosis not present

## 2022-03-12 DIAGNOSIS — Z9181 History of falling: Secondary | ICD-10-CM | POA: Diagnosis not present

## 2022-03-12 DIAGNOSIS — M48061 Spinal stenosis, lumbar region without neurogenic claudication: Secondary | ICD-10-CM | POA: Diagnosis not present

## 2022-03-12 DIAGNOSIS — M19012 Primary osteoarthritis, left shoulder: Secondary | ICD-10-CM | POA: Diagnosis not present

## 2022-03-12 DIAGNOSIS — S46002D Unspecified injury of muscle(s) and tendon(s) of the rotator cuff of left shoulder, subsequent encounter: Secondary | ICD-10-CM | POA: Diagnosis not present

## 2022-03-13 ENCOUNTER — Ambulatory Visit
Admission: RE | Admit: 2022-03-13 | Discharge: 2022-03-13 | Disposition: A | Discharge status: Home / Self Care | Payer: Medicare Other | Source: Ambulatory Visit | Attending: Surgical | Admitting: Surgical

## 2022-03-13 DIAGNOSIS — M47816 Spondylosis without myelopathy or radiculopathy, lumbar region: Secondary | ICD-10-CM | POA: Diagnosis not present

## 2022-03-13 DIAGNOSIS — M545 Low back pain, unspecified: Secondary | ICD-10-CM

## 2022-03-13 DIAGNOSIS — M25512 Pain in left shoulder: Secondary | ICD-10-CM

## 2022-03-13 DIAGNOSIS — M48061 Spinal stenosis, lumbar region without neurogenic claudication: Secondary | ICD-10-CM | POA: Diagnosis not present

## 2022-03-16 ENCOUNTER — Other Ambulatory Visit: Payer: Self-pay | Admitting: Cardiology

## 2022-03-16 ENCOUNTER — Ambulatory Visit (INDEPENDENT_AMBULATORY_CARE_PROVIDER_SITE_OTHER): Payer: Medicare Other

## 2022-03-16 ENCOUNTER — Other Ambulatory Visit: Payer: Self-pay | Admitting: Family Medicine

## 2022-03-16 ENCOUNTER — Telehealth: Payer: Self-pay

## 2022-03-16 VITALS — BP 132/80 | HR 67 | Ht 68.0 in | Wt 201.6 lb

## 2022-03-16 DIAGNOSIS — Z0000 Encounter for general adult medical examination without abnormal findings: Secondary | ICD-10-CM

## 2022-03-16 MED ORDER — HYDROCODONE-ACETAMINOPHEN 5-325 MG PO TABS: 1.0000 | ORAL_TABLET | Freq: Four times a day (QID) | ORAL | 0 refills | Status: DC | PRN

## 2022-03-16 NOTE — Progress Notes (Signed)
Subjective:   Gerald Hall is a 87 y.o. male who presents for Medicare Annual/Subsequent preventive examination.  Review of Systems     Cardiac Risk Factors include: advanced age (>82mn, >>49women);hypertension;dyslipidemia;male gender;sedentary lifestyle;smoking/ tobacco exposure;obesity (BMI >30kg/m2)     Objective:    Today's Vitals   03/16/22 1117 03/16/22 1122  BP: 132/80   Pulse: 67   SpO2: 99%   Weight: 201 lb 9.6 oz (91.4 kg)   Height: '5\' 8"'$  (1.727 m)   PainSc:  8    Body mass index is 30.65 kg/m.     03/16/2022   11:48 AM 03/16/2022   11:30 AM 11/20/2021    5:06 PM 02/10/2021   11:18 AM 02/24/2019    8:45 AM 02/23/2019    6:10 PM 08/31/2017   10:34 AM  Advanced Directives  Does Patient Have a Medical Advance Directive? Yes Yes No Yes Yes Yes Yes  Type of AParamedicof [place] will [place] will  [place] will Healthcare Power of [place] CityLiving will  Does patient want to make changes to medical advance directive?    No - Patient declined No - Patient declined    Copy of HPens[place]olain Chart? No - copy requested Yes - validated most recent copy scanned in chart (See row information)  No - copy requested   No - copy requested  Would patient like information on creating a medical advance directive?   No - Patient declined    No - Patient declined    Current Medications (verified) Outpatient Encounter Medications as of 03/16/2022  Medication Sig   acetaminophen (TYLENOL) 325 MG tablet Take 1,000 mg by mouth every 6 (six) hours as needed for mild pain or headache.   albuterol (VENTOLIN HFA) 108 (90 Base) MCG/ACT inhaler Inhale 2 puffs into the lungs every 4 (four) hours as needed for wheezing.   amiodarone (PACERONE) 200 MG tablet TAKE 1 TABLET BY MOUTH ONCE DAILY.   aspirin EC 325 MG EC tablet Take 1 tablet (325  mg total) by mouth daily.   calcium carbonate (OS-CAL) 600 MG TABS tablet Take 600 mg by mouth daily with breakfast.   carvedilol (COREG) 3.125 MG tablet TAKE (1) TABLET BY MOUTH TWICE A DAY WITH MEALS (BREAKFAST AND SUPPER)   cholecalciferol (VITAMIN D) 400 units TABS tablet Take 1,000 Units by mouth daily.   Dextromethorphan-guaiFENesin (MUCINEX DM) 30-600 MG TB12 Take 1 tablet by mouth daily.    finasteride (PROSCAR) 5 MG tablet TAKE 1 TABLET BY MOUTH ONCE DAILY.   fluticasone (FLONASE) 50 MCG/ACT nasal spray Place 2 sprays into both nostrils at bedtime.   hydrochlorothiazide (HYDRODIURIL) 12.5 MG tablet TAKE 1 TABLET BY MOUTH IN THE MORNING.   methimazole (TAPAZOLE) 5 MG tablet Take 1 tablet (5 mg total) by mouth daily.   mirabegron ER (MYRBETRIQ) 50 MG TB24 tablet Take 50 mg by mouth daily.   OXYGEN Inhale 2 L into the lungs continuous.   oxymetazoline (AFRIN) 0.05 % nasal spray Place 1 spray into both nostrils at bedtime.   polyethylene glycol (MIRALAX / GLYCOLAX) 17 g packet Take 17 g by mouth daily as needed.   polyvinyl alcohol (LIQUIFILM TEARS) 1.4 % ophthalmic solution Place 1 drop into both eyes as needed for dry eyes.   potassium chloride (KLOR-CON) 10 MEQ tablet TAKE 1 TABLET BY MOUTH TWICE DAILY   potassium chloride (KLOR-CON) 10  MEQ tablet TAKE 1 TABLET BY MOUTH TWICE DAILY   pregabalin (LYRICA) 25 MG capsule Take 1 capsule (25 mg total) by mouth 2 (two) times daily.   rOPINIRole (REQUIP) 2 MG tablet TAKE 1 TABLET BY MOUTH AT LATE AFTERNOON AND AT BEDTIME (Patient taking differently: Take 2 mg by mouth at bedtime.)   sodium chloride (OCEAN) 0.65 % SOLN nasal spray Place 1 spray into both nostrils as needed for congestion.   SYMBICORT 160-4.5 MCG/ACT inhaler INHALE 2 PUFFS BY MOUTH TWICE DAILY   tamsulosin (FLOMAX) 0.4 MG CAPS capsule TAKE (1) CAPSULE BY MOUTH TWICE DAILY.   traMADol (ULTRAM) 50 MG tablet TAKE (1) TABLET BY MOUTH EVERY TWELVE HOURS AS NEEDED. (Patient taking  differently: Take 50 mg by mouth 2 (two) times daily as needed for moderate pain.)   vitamin C (ASCORBIC ACID) 500 MG tablet Take 500 mg by mouth daily.   Zinc Acetate, Oral, (ZINC ACETATE PO) Take 1 tablet by mouth daily.   No facility-administered encounter medications on file as of 03/16/2022.    Allergies (verified) Cephalexin and Penicillins   History: Past Medical History:  Diagnosis Date   Arthritis    Balance problem    Uses a walker   Blood in urine    Sees urology yearly   Deafness in right ear age 86   Hyperlipidemia    Hypertension    PSVT (paroxysmal supraventricular tachycardia) ([place])    Temporal arteritis ([place]) 2016   Urinary incontinence    Past Surgical History:  Procedure Laterality Date   BACK SURGERY  2010   [place]     CATARACT EXTRACTION Bilateral 07/30/14, 08/13/2014   CERVICAL SPINE SURGERY  09/01/15   C 4-5 , [place]   COLONOSCOPY  12/19/2007   RMR: 1. External hemorrohids, otherwise normal rectum.2. Normal colon. 3. Normal terminal ileum.   COLONOSCOPY N/A 12/13/2014   Procedure: COLONOSCOPY;  Surgeon: Daneil Dolin, MD;  [facility];  Service: Endoscopy;  Laterality: N/A;  11:15 Pt Request Time   KNEE SURGERY Left    around 2000, arthroscopic   TOTAL HIP ARTHROPLASTY Right 02/23/2017   Procedure: TOTAL HIP ARTHROPLASTY ANTERIOR APPROACH;  Surgeon: Hessie Knows, MD;  Location: [facility] ORS;  Service: Orthopedics;  Laterality: Right;   Family History  Problem Relation Age of Onset   Colon cancer Brother    Diabetes Brother    Diabetes Brother    Social History   Socioeconomic History   Marital status: Widowed    Spouse name: Pamala Hurry   Number of children: 2   Years of education: 16   Highest education level: Not on file  Occupational History    Comment: retired Software engineer  Tobacco Use   Smoking status: Every Day    Packs/day: 0.50    Types: Cigarettes   Smokeless tobacco: Never   Tobacco comments:     08/18/15 1/2 - 3/4 pack cigarettes daily  Vaping Use   Vaping Use: Never used  Substance and Sexual Activity   Alcohol use: Yes    Alcohol/week: 0.0 standard drinks    Comment: mixed drink 1-2 times per month, social   Drug use: No   Sexual activity: Not on file  Other Topics Concern   Not on file  Social History Narrative   Lives at home. Has home health aids to assist with housekeeping/appointments.   Caffeine use- coffee 8-10 cups daily   2 daughters, one lives locally, other lives in [place].  2 granddaughters.   1 great granddaughter-age 41.   Social Determinants of Health   Financial Resource Strain: Low Risk    Difficulty of Paying Living Expenses: Not hard at all  Food Insecurity: No Food Insecurity   Worried About Charity fundraiser in the Last Year: Never true   [place] in the Last Year: Never true  Transportation Needs: No Transportation Needs   Lack of Transportation (Medical): No   Lack of Transportation (Non-Medical): No  Physical Activity: Sufficiently Active   Days of Exercise per Week: 3 days   Minutes of Exercise per Session: 60 min  Stress: No Stress Concern Present   Feeling of Stress : Only a little  Social Connections: Moderately Integrated   Frequency of Communication with Friends and Family: More than three times a week   Frequency of Social Gatherings with Friends and Family: More than three times a week   Attends Religious Services: More than 4 times per year   Active Member of [employer] or Organizations: Yes   Attends Archivist Meetings: More than 4 times per year   Marital Status: Widowed    Tobacco Counseling Ready to quit: Not Answered Counseling given: Not Answered Tobacco comments: 08/18/15 1/2 - 3/4 pack cigarettes daily   Clinical Intake:  Pre-visit preparation completed: Yes  Pain : 0-10 Pain Score: 8  Pain Type: Acute pain Pain Location: Back Pain Descriptors / Indicators: Radiating, Spasm Pain Onset: 1 to 4  weeks ago Pain Frequency: Intermittent     BMI - recorded: 30.65 Nutritional Status: BMI > 30  Obese Nutritional Risks: None Diabetes: No  How often do you need to have someone help you when you read instructions, pamphlets, or other written materials from your doctor or pharmacy?: 1 - Never  Diabetic?NO  Interpreter Needed?: No  Information entered by :: mj Caden Fukushima, lpn   Activities of Daily Living    03/16/2022   11:49 AM 03/05/2022    8:59 AM  In your present state of health, do you have any difficulty performing the following activities:  Hearing? '1 1   1  '$ Vision? 0 0   0  Difficulty concentrating or making decisions? '1 1   1  '$ Walking or climbing stairs? '1 1   1  '$ Dressing or bathing? '1 1   1  '$ Doing errands, shopping? '1 1   1  '$ Preparing Food and eating ? Y Y   Y  Using the Toilet? N N   N  In the past six months, have you accidently leaked urine? Y Y   Y  Do you have problems with loss of bowel control? N N   N  Managing your Medications? N N   N  Managing your Finances? N N   N  Housekeeping or managing your Housekeeping? Judyann Munson    Patient Care Team: Kathyrn Drown, MD as PCP - General (Family Medicine) Satira Sark, MD as PCP - Cardiology (Cardiology) Kathyrn Drown, MD (Family Medicine) Gala Romney Cristopher Estimable, MD as Consulting Physician (Gastroenterology) Minna Merritts, MD as Consulting Physician (Cardiology)  Indicate any recent Medical Services you may have received from other than [facility] providers in the past year (date may be approximate).     Assessment:   This is a routine wellness examination for [facility].  Hearing/Vision screen Hearing Screening - Comments:: Hearing loss R ear. Vision Screening - Comments:: Glasses. Dr. Lonzo Cloud in [place]. 2022.  Dietary issues and exercise activities discussed: Current Exercise Habits: Home exercise routine, Type of exercise: Other - see comments (Currently getting PT at home.), Time (Minutes):  60, Frequency (Times/Week): 3, Weekly Exercise (Minutes/Week): 180, Intensity: Mild, Exercise limited by: cardiac condition(s);orthopedic condition(s);respiratory conditions(s)   Goals Addressed             This Visit's Progress    Medication Management   On track    Patient Goals/Self-Care Activities Patient will:  Take medications as prescribed Check blood pressure at least once daily, document, and provide at future appointments     Prevent falls   On track    Continue to use walker to help prevent falls        Depression Screen    03/16/2022   11:26 AM 11/10/2021   11:06 AM 07/16/2021   10:06 AM 02/10/2021   11:25 AM 11/12/2020    1:14 PM 05/12/2020    1:11 PM 05/08/2018    1:24 PM  PHQ 2/9 Scores  PHQ - 2 Score 0 0 0 0 0 0 0    Fall Risk    03/16/2022   11:30 AM 03/05/2022    8:59 AM 02/12/2022    2:09 PM 11/10/2021   11:05 AM 08/10/2021   11:09 AM  Fall Risk   Falls in the past year? '1 1   1 1 '$ 0 0  Number falls in past yr: '1 1   1 '$ 0 0 0  Injury with Fall? '1 1   1 1 '$ 0 0  Risk for fall due to : Impaired balance/gait;Impaired mobility;History of fall(s)  Impaired balance/gait No Fall Risks No Fall Risks  Follow up Falls prevention discussed  Falls evaluation completed;Education provided;Falls prevention discussed Falls evaluation completed Falls evaluation completed    FALL RISK PREVENTION PERTAINING TO THE HOME:  Any stairs in or around the home? No  If so, are there any without handrails? No  Home free of loose throw rugs in walkways, pet beds, electrical cords, etc? Yes  Adequate lighting in your home to reduce risk of falls? Yes   ASSISTIVE DEVICES UTILIZED TO PREVENT FALLS:  Life alert? Yes  Use of a cane, walker or w/c? Yes  Grab bars in the bathroom? Yes  Shower chair or bench in shower? Yes  Elevated toilet seat or a handicapped toilet? No   TIMED UP AND GO:  Was the test performed? Yes .  Length of time to ambulate 10 feet: 12 sec.   Gait slow  and steady with assistive device  Cognitive Function:      01/26/2020    8:49 AM  Montreal Cognitive Assessment   Visuospatial/ Executive (0/5) 5  Naming (0/3) 3  Attention: Read list of digits (0/2) 2  Attention: Read list of letters (0/1) 1  Attention: Serial 7 subtraction starting at 100 (0/3) 3  Language: Repeat phrase (0/2) 2  Language : Fluency (0/1) 1  Abstraction (0/2) 2  Delayed Recall (0/5) 2  Orientation (0/6) 6  Total 27  Adjusted Score (based on education) 27      03/16/2022   11:52 AM 02/10/2021   11:27 AM  6CIT Screen  What Year? 0 points 0 points  What month? 0 points 0 points  What time? 0 points 0 points  Count back from 20 0 points 0 points  Months in reverse 0 points 0 points  Repeat phrase 2 points 0 points  Total Score 2 points 0 points    Immunizations  Immunization History  Administered Date(s) Administered   Fluad Quad(high Dose 65+) 08/12/2020, 08/10/2021   Influenza,inj,Quad PF,6+ Mos 07/20/2016, 08/07/2018, 08/22/2019   Influenza-Unspecified 09/03/2013   Moderna Sars-Covid-2 Vaccination 10/30/2019, 11/27/2019, 08/28/2020   Pneumococcal Conjugate-13 09/03/2014   Pneumococcal Polysaccharide-23 07/20/2016   Zoster Recombinat (Shingrix) 09/26/2017, 02/23/2018    TDAP status: Due, Education has been provided regarding the importance of this vaccine. Advised may receive this vaccine at local pharmacy or Health Dept. Aware to provide a copy of the vaccination record if obtained from local pharmacy or Health Dept. Verbalized acceptance and understanding.  Flu Vaccine status: Up to date  Pneumococcal vaccine status: Up to date  Covid-19 vaccine status: Completed vaccines  Qualifies for Shingles Vaccine? Yes   Zostavax completed Yes   Shingrix Completed?: Yes  Screening Tests Health Maintenance  Topic Date Due   TETANUS/TDAP  10/22/2022 (Originally 08/08/1954)   INFLUENZA VACCINE  05/25/2022   Pneumonia Vaccine 37+ Years old  Completed    Zoster Vaccines- Shingrix  Completed   HPV VACCINES  Aged Out   COVID-19 Vaccine  Discontinued    Health Maintenance  There are no preventive care reminders to display for this patient.   Colorectal cancer screening: No longer required.   Lung Cancer Screening: (Low Dose CT Chest recommended if Age 63-80 years, 30 pack-year currently smoking OR have quit w/in 15years.) does not qualify.    Additional Screening:  Hepatitis C Screening: does not qualify;  Vision Screening: Recommended annual ophthalmology exams for early detection of glaucoma and other disorders of the eye. Is the patient up to date with their annual eye exam?  Yes  Who is the provider or what is the name of the office in which the patient attends annual eye exams? Dr. Lonzo Cloud at [facility] If pt is not established with a provider, would they like to be referred to a provider to establish care? No .   Dental Screening: Recommended annual dental exams for proper oral hygiene  Community Resource Referral / Chronic Care Management: CRR required this visit?  No   CCM required this visit?  No      Plan:     I have personally reviewed and noted the following in the patient's chart:   Medical and social history Use of alcohol, tobacco or illicit drugs  Current medications and supplements including opioid prescriptions. Patient is currently taking opioid prescriptions. Information provided to patient regarding non-opioid alternatives. Patient advised to discuss non-opioid treatment plan with their provider. Functional ability and status Nutritional status Physical activity Advanced directives List of other physicians Hospitalizations, surgeries, and ER visits in previous 12 months Vitals Screenings to include cognitive, depression, and falls Referrals and appointments  In addition, I have reviewed and discussed with patient certain preventive protocols, quality metrics, and best practice recommendations. A  written personalized care plan for preventive services as well as general preventive health recommendations were provided to patient.     Chriss Driver, LPN   5/73/2202   Nurse Notes: Discussed Tdap and how to obtain.

## 2022-03-16 NOTE — Addendum Note (Signed)
Addended by: Sallee Lange A on: 03/16/2022 04:36 PM   Modules accepted: Orders

## 2022-03-16 NOTE — Telephone Encounter (Signed)
Pt in for AWV today. Pt states he is still having issues with back pain, s/p fall 4 weeks ago. Pt states he had MRI done on 03/13/21 and has follow up with orthopedic PA on Friday 5/26. Pt asks if he can get an rx for Hydrocodone to help with his pain, that he rates at an 8. Pt states the Tramadol is not helping him rest at night and he feels like the Hydrocodone will. Thank you.

## 2022-03-16 NOTE — Telephone Encounter (Signed)
Patient notified prescription sent in. Verbalized understanding.

## 2022-03-16 NOTE — Addendum Note (Signed)
Addended by: Dairl Ponder on: 03/16/2022 02:21 PM   Modules accepted: Orders

## 2022-03-16 NOTE — Telephone Encounter (Signed)
Nurses May do short prescription of hydrocodone Please let patient know we could send some into his pharmacy this can cause drowsiness not for frequent use Hydrocodone 5 mg / 325 mg 1 every 6 hours as needed severe pain #20 Please pend Please verify pharmacy

## 2022-03-16 NOTE — Telephone Encounter (Signed)
Patient notified and would like script sent to [facility].

## 2022-03-16 NOTE — Patient Instructions (Signed)
Gerald Hall , Thank you for taking time to come for your Medicare Wellness Visit. I appreciate your ongoing commitment to your health goals. Please review the following plan we discussed and let me know if I can assist you in the future.   Screening recommendations/referrals: Colonoscopy: No longer required due age 87.  Recommended yearly ophthalmology/optometry visit for glaucoma screening and checkup Recommended yearly dental visit for hygiene and checkup  Vaccinations: Influenza vaccine: Done 08/10/2021. Repeat annually  Pneumococcal vaccine: Done 09/03/2014 and 07/20/2016. Tdap vaccine: Due. Repeat in 10 years  Shingles vaccine: Done 09/26/2017 and 02/23/2018. Covid-19: Done 08/28/2020, 12/15/2019 and 10/30/2019.    Advanced directives: Please bring a copy of your health care power of attorney and living will to the office to be added to your chart at your convenience.   Conditions/risks identified: Aim for 30 minutes of exercise or brisk walking, 6-8 glasses of water, and 5 servings of fruits and vegetables each day. KEEP UP THE GOOD WORK!!  Next appointment: Follow up in one year for your annual wellness visit. 2024.  Preventive Care 85 Years and Older, Male  Preventive care refers to lifestyle choices and visits with your health care provider that can promote health and wellness. What does preventive care include? A yearly physical exam. This is also called an annual well check. Dental exams once or twice a year. Routine eye exams. Ask your health care provider how often you should have your eyes checked. Personal lifestyle choices, including: Daily care of your teeth and gums. Regular physical activity. Eating a healthy diet. Avoiding tobacco and drug use. Limiting alcohol use. Practicing safe sex. Taking low doses of aspirin every day. Taking vitamin and mineral supplements as recommended by your health care provider. What happens during an annual well check? The services and  screenings done by your health care provider during your annual well check will depend on your age, overall health, lifestyle risk factors, and family history of disease. Counseling  Your health care provider may ask you questions about your: Alcohol use. Tobacco use. Drug use. Emotional well-being. Home and relationship well-being. Sexual activity. Eating habits. History of falls. Memory and ability to understand (cognition). Work and work Statistician. Screening  You may have the following tests or measurements: Height, weight, and BMI. Blood pressure. Lipid and cholesterol levels. These may be checked every 5 years, or more frequently if you are over 85 years old. Skin check. Lung cancer screening. You may have this screening every year starting at age 85 if you have a 30-pack-year history of smoking and currently smoke or have quit within the past 15 years. Fecal occult blood test (FOBT) of the stool. You may have this test every year starting at age 85. Flexible sigmoidoscopy or colonoscopy. You may have a sigmoidoscopy every 5 years or a colonoscopy every 10 years starting at age 85. Prostate cancer screening. Recommendations will vary depending on your family history and other risks. Hepatitis C blood test. Hepatitis B blood test. Sexually transmitted disease (STD) testing. Diabetes screening. This is done by checking your blood sugar (glucose) after you have not eaten for a while (fasting). You may have this done every 1-3 years. Abdominal aortic aneurysm (AAA) screening. You may need this if you are a current or former smoker. Osteoporosis. You may be screened starting at age 85 if you are at high risk. Talk with your health care provider about your test results, treatment options, and if necessary, the need for more tests. Vaccines  Your  health care provider may recommend certain vaccines, such as: Influenza vaccine. This is recommended every year. Tetanus, diphtheria, and  acellular pertussis (Tdap, Td) vaccine. You may need a Td booster every 10 years. Zoster vaccine. You may need this after age 85. Pneumococcal 13-valent conjugate (PCV13) vaccine. One dose is recommended after age 85. Pneumococcal polysaccharide (PPSV23) vaccine. One dose is recommended after age 85. Talk to your health care provider about which screenings and vaccines you need and how often you need them. This information is not intended to replace advice given to you by your health care provider. Make sure you discuss any questions you have with your health care provider. Document Released: 11/07/2015 Document Revised: 06/30/2016 Document Reviewed: 08/12/2015 Elsevier Interactive Patient Education  2017 [place] Prevention in the Home Falls can cause injuries. They can happen to people of all ages. There are many things you can do to make your home safe and to help prevent falls. What can I do on the outside of my home? Regularly fix the edges of walkways and driveways and fix any cracks. Remove anything that might make you trip as you walk through a door, such as a raised step or threshold. Trim any bushes or trees on the path to your home. Use bright outdoor lighting. Clear any walking paths of anything that might make someone trip, such as rocks or tools. Regularly check to see if handrails are loose or broken. Make sure that both sides of any steps have handrails. Any raised decks and porches should have guardrails on the edges. Have any leaves, snow, or ice cleared regularly. Use sand or salt on walking paths during winter. Clean up any spills in your garage right away. This includes oil or grease spills. What can I do in the bathroom? Use night lights. Install grab bars by the toilet and in the tub and shower. Do not use towel bars as grab bars. Use non-skid mats or decals in the tub or shower. If you need to sit down in the shower, use a plastic, non-slip stool. Keep  the floor dry. Clean up any water that spills on the floor as soon as it happens. Remove soap buildup in the tub or shower regularly. Attach bath mats securely with double-sided non-slip rug tape. Do not have throw rugs and other things on the floor that can make you trip. What can I do in the bedroom? Use night lights. Make sure that you have a light by your bed that is easy to reach. Do not use any sheets or blankets that are too big for your bed. They should not hang down onto the floor. Have a firm chair that has side arms. You can use this for support while you get dressed. Do not have throw rugs and other things on the floor that can make you trip. What can I do in the kitchen? Clean up any spills right away. Avoid walking on wet floors. Keep items that you use a lot in easy-to-reach places. If you need to reach something above you, use a strong step stool that has a grab bar. Keep electrical cords out of the way. Do not use floor polish or wax that makes floors slippery. If you must use wax, use non-skid floor wax. Do not have throw rugs and other things on the floor that can make you trip. What can I do with my stairs? Do not leave any items on the stairs. Make sure that there are handrails  on both sides of the stairs and use them. Fix handrails that are broken or loose. Make sure that handrails are as long as the stairways. Check any carpeting to make sure that it is firmly attached to the stairs. Fix any carpet that is loose or worn. Avoid having throw rugs at the top or bottom of the stairs. If you do have throw rugs, attach them to the floor with carpet tape. Make sure that you have a light switch at the top of the stairs and the bottom of the stairs. If you do not have them, ask someone to add them for you. What else can I do to help prevent falls? Wear shoes that: Do not have high heels. Have rubber bottoms. Are comfortable and fit you well. Are closed at the toe. Do not  wear sandals. If you use a stepladder: Make sure that it is fully opened. Do not climb a closed stepladder. Make sure that both sides of the stepladder are locked into place. Ask someone to hold it for you, if possible. Clearly mark and make sure that you can see: Any grab bars or handrails. First and last steps. Where the edge of each step is. Use tools that help you move around (mobility aids) if they are needed. These include: Canes. Walkers. Scooters. Crutches. Turn on the lights when you go into a dark area. Replace any light bulbs as soon as they burn out. Set up your furniture so you have a clear path. Avoid moving your furniture around. If any of your floors are uneven, fix them. If there are any pets around you, be aware of where they are. Review your medicines with your doctor. Some medicines can make you feel dizzy. This can increase your chance of falling. Ask your doctor what other things that you can do to help prevent falls. This information is not intended to replace advice given to you by your health care provider. Make sure you discuss any questions you have with your health care provider. Document Released: 08/07/2009 Document Revised: 03/18/2016 Document Reviewed: 11/15/2014 Elsevier Interactive Patient Education  2017 [employer].

## 2022-03-16 NOTE — Telephone Encounter (Signed)
Prescription was sent in.

## 2022-03-18 DIAGNOSIS — M8580 Other specified disorders of bone density and structure, unspecified site: Secondary | ICD-10-CM | POA: Diagnosis not present

## 2022-03-18 DIAGNOSIS — J432 Centrilobular emphysema: Secondary | ICD-10-CM | POA: Diagnosis not present

## 2022-03-18 DIAGNOSIS — G4700 Insomnia, unspecified: Secondary | ICD-10-CM | POA: Diagnosis not present

## 2022-03-18 DIAGNOSIS — S46002D Unspecified injury of muscle(s) and tendon(s) of the rotator cuff of left shoulder, subsequent encounter: Secondary | ICD-10-CM | POA: Diagnosis not present

## 2022-03-18 DIAGNOSIS — H3530 Unspecified macular degeneration: Secondary | ICD-10-CM | POA: Diagnosis not present

## 2022-03-18 DIAGNOSIS — N1831 Chronic kidney disease, stage 3a: Secondary | ICD-10-CM | POA: Diagnosis not present

## 2022-03-18 DIAGNOSIS — Z9181 History of falling: Secondary | ICD-10-CM | POA: Diagnosis not present

## 2022-03-18 DIAGNOSIS — I129 Hypertensive chronic kidney disease with stage 1 through stage 4 chronic kidney disease, or unspecified chronic kidney disease: Secondary | ICD-10-CM | POA: Diagnosis not present

## 2022-03-18 DIAGNOSIS — M48061 Spinal stenosis, lumbar region without neurogenic claudication: Secondary | ICD-10-CM | POA: Diagnosis not present

## 2022-03-18 DIAGNOSIS — Z7982 Long term (current) use of aspirin: Secondary | ICD-10-CM | POA: Diagnosis not present

## 2022-03-18 DIAGNOSIS — M19012 Primary osteoarthritis, left shoulder: Secondary | ICD-10-CM | POA: Diagnosis not present

## 2022-03-18 DIAGNOSIS — F17210 Nicotine dependence, cigarettes, uncomplicated: Secondary | ICD-10-CM | POA: Diagnosis not present

## 2022-03-18 DIAGNOSIS — S301XXD Contusion of abdominal wall, subsequent encounter: Secondary | ICD-10-CM | POA: Diagnosis not present

## 2022-03-18 DIAGNOSIS — E876 Hypokalemia: Secondary | ICD-10-CM | POA: Diagnosis not present

## 2022-03-18 DIAGNOSIS — G2581 Restless legs syndrome: Secondary | ICD-10-CM | POA: Diagnosis not present

## 2022-03-18 DIAGNOSIS — Z9981 Dependence on supplemental oxygen: Secondary | ICD-10-CM | POA: Diagnosis not present

## 2022-03-18 DIAGNOSIS — W19XXXD Unspecified fall, subsequent encounter: Secondary | ICD-10-CM | POA: Diagnosis not present

## 2022-03-18 DIAGNOSIS — M4802 Spinal stenosis, cervical region: Secondary | ICD-10-CM | POA: Diagnosis not present

## 2022-03-18 DIAGNOSIS — I471 Supraventricular tachycardia: Secondary | ICD-10-CM | POA: Diagnosis not present

## 2022-03-18 DIAGNOSIS — M5136 Other intervertebral disc degeneration, lumbar region: Secondary | ICD-10-CM | POA: Diagnosis not present

## 2022-03-18 DIAGNOSIS — E785 Hyperlipidemia, unspecified: Secondary | ICD-10-CM | POA: Diagnosis not present

## 2022-03-18 DIAGNOSIS — Z7951 Long term (current) use of inhaled steroids: Secondary | ICD-10-CM | POA: Diagnosis not present

## 2022-03-18 DIAGNOSIS — Z79891 Long term (current) use of opiate analgesic: Secondary | ICD-10-CM | POA: Diagnosis not present

## 2022-03-18 NOTE — Progress Notes (Signed)
Can you have Gerald Hall follow-up with Dr Marlou Sa? He should see him since he saw me last time and currently is scheduled to see me tomorrow

## 2022-03-19 ENCOUNTER — Ambulatory Visit: Payer: Medicare Other | Admitting: Surgical

## 2022-03-19 ENCOUNTER — Telehealth: Payer: Self-pay

## 2022-03-19 DIAGNOSIS — M545 Low back pain, unspecified: Secondary | ICD-10-CM | POA: Diagnosis not present

## 2022-03-19 NOTE — Telephone Encounter (Signed)
Error

## 2022-03-22 ENCOUNTER — Encounter: Payer: Self-pay | Admitting: Surgical

## 2022-03-22 NOTE — Progress Notes (Signed)
Office Visit Note   Patient: Gerald Hall           Date of Birth: 1935/09/22           MRN: [id number] Visit Date: 03/19/2022 Requested by: Kathyrn Drown, MD [address] [place],  [place] [address] PCP: Kathyrn Drown, MD  Subjective: Chief Complaint  Patient presents with   Lower Back - Follow-up    HPI: Gerald Hall is a 87 y.o. male who presents to the office for MRI review.  Continues to complain mainly of low back pain.  He notes bilateral lumbar spine pain that radiates down both legs to the back of his knees.  He denies any red flag symptoms such as bowel/bladder incontinence or saddle anesthesia.  He had an MRI scan of his lumbar spine done and is here to review that today.  Unfortunately, he was not able to have MRI of his shoulder done due to severe claustrophobia after the initial MRI of the lumbar spine.  Fortunately, his shoulder is feeling 60% better and he can lift his arm further and further up as he gets further out from the cortisone injection that he had at his last appointment.  Still having to occasionally take opioid pain medication for his back pain.  MRI results revealed: MR Lumbar Spine w/o contrast  Result Date: 03/15/2022 CLINICAL DATA:  Low back pain for the past month. History of prior surgery. EXAM: MRI LUMBAR SPINE WITHOUT CONTRAST TECHNIQUE: Multiplanar, multisequence MR imaging of the lumbar spine was performed. No intravenous contrast was administered. COMPARISON:  Lumbar spine x-rays dated February 12, 2022. MRI lumbar spine dated February 07, 2015. FINDINGS: Segmentation:  Standard. Alignment: Unchanged 5 mm anterolisthesis at L4-L5. Unchanged trace retrolisthesis at L5-S1. Vertebrae: No fracture, evidence of discitis, or bone lesion. New degenerative endplate marrow edema at L2-L3. Conus medullaris and cauda equina: Conus extends to the L1 level. Conus and cauda equina appear normal. Paraspinal and other soft tissues: Bilateral renal simple  cysts. No follow-up imaging is recommended. Otherwise negative. Disc levels: T12-L1:  Unchanged minimal disc bulging.  No stenosis. L1-L2:  Unchanged mild disc bulging.  No stenosis. L2-L3: Progressive moderate disc bulging and endplate spurring with new superimposed small right subarticular extruded disc fragment posterior to the L3 superior endplate (series 13, image 18; series 5, image 6). Progressive bilateral facet arthropathy. Worsened now severe spinal canal stenosis and moderate bilateral neuroforaminal stenosis. L3-L4: Progressive moderate disc bulging and bilateral facet arthropathy with worsened now moderate spinal canal stenosis. Unchanged mild bilateral neuroforaminal stenosis. L4-L5: Prior posterior decompression. Progressive moderate disc bulging with new superimposed left subarticular disc extrusion (series 13, image 28; series 5, image 9). Continued large left foraminal disc protrusion. Progressive severe bilateral facet arthropathy. New severe left lateral recess stenosis. Unchanged severe left and mild right neuroforaminal stenosis. L5-S1: Unchanged small circumferential disc osteophyte complex and mild bilateral facet arthropathy. Unchanged mild bilateral neuroforaminal stenosis. No spinal canal stenosis. IMPRESSION: 1. Progressive lumbar spondylosis as described above. New severe spinal canal stenosis and moderate bilateral neuroforaminal stenosis at L2-L3. 2. Progressive now moderate spinal canal stenosis at L3-L4. 3. New severe left lateral recess stenosis at L4-L5 due to new large left subarticular disc extrusion. Unchanged large left foraminal disc protrusion at this level with severe left neuroforaminal stenosis. Electronically Signed   By: Titus Dubin M.D.   On: 03/15/2022 10:27  ROS: All systems reviewed are negative as they relate to the chief complaint within the history of present illness.  Patient denies fevers or chills.  Assessment & Plan: Visit Diagnoses:   1. Low back pain, unspecified back pain laterality, unspecified chronicity, unspecified whether sciatica present     Plan: Gerald Hall is a 87 y.o. male who presents to the office for evaluation of low back pain with bilateral leg radicular symptoms.  He is here to review MRI lumbar spine today.  MRI images were reviewed and reveal moderate to severe spinal stenosis at multiple levels including L2-L3 and L3-L4 with moderate bilateral foraminal stenosis at L2-L3.  Also has a new severe left lateral recess stenosis at L4-L5 due to large left subarticular disc extrusion.  Plan to refer patient to Dr. Laurence Spates for lumbar spine ESI.  He will be placed on the cancellation list due to his severe symptoms.  Also encouraged him to reach out to his neurosurgeon in [place], Dr. Nicoletta Ba, for his review of the MRI scan and whether or not this appears to be something that can be managed with just ESI's and physical therapy versus operative management.  He will set up appointment with his neurosurgeon but encouraged him to reach out to this office if he has any difficulties.  Recommended he obtain MRI disc from [facility] imaging to bring to his appointment with Dr. Nicoletta Ba.  He is able to actively lift his shoulder up a lot higher than on previous exam and has about 60% improvement of his symptoms from the shoulder injection.  Plan to focus on the back pain and bilateral radicular pain for now but encouraged him to return if he would like to retry obtaining the MRI of the shoulder.  Follow-Up Instructions: No follow-ups on file.   Orders:  Orders Placed This Encounter  Procedures   Ambulatory referral to Physical Medicine Rehab   No orders of the defined types were placed in this encounter.     Procedures: No procedures performed   Clinical Data: No additional findings.  Objective: Vital Signs: There were no vitals taken for this visit.  Physical Exam:  Constitutional: Patient appears  well-developed HEENT:  Head: Normocephalic Eyes:EOM are normal Neck: Normal range of motion Cardiovascular: Normal rate Pulmonary/chest: Effort normal Neurologic: Patient is alert Skin: Skin is warm Psychiatric: Patient has normal mood and affect  Ortho Exam: Ortho exam demonstrates 5/5 motor strength of bilateral hip flexion, quadricep, hamstring, dorsiflexion, plantarflexion.  No clonus noted in either extremity.  2+ patellar tendon reflexes in both lower extremities.  Specialty Comments:  No specialty comments available.  Imaging: No results found.   PMFS History: Patient Active Problem List   Diagnosis Date Noted   Hyperthyroidism 03/04/2022   Stage 3a chronic kidney disease ([place]) 11/28/2021   COPD with exacerbation ([place]) 11/22/2021   COPD with acute exacerbation ([place]) 11/20/2021   Hypokalemia 11/20/2021   Acute left-sided low back pain without sciatica 10/15/2021   Centrilobular emphysema ([place]) 11/12/2020   Secondary cardiomyopathy ([place]) 11/12/2020   Subacute thyroiditis 09/25/2020   Brow ptosis, bilateral 07/03/2020   Dermatochalasis of both upper eyelids 07/03/2020   Ptosis, both eyelids 07/03/2020   Impacted cerumen of right ear 03/06/2020   Sensorineural hearing loss (SNHL) of right ear with restricted hearing of left ear 03/06/2020   Primary osteoarthritis involving multiple joints 02/21/2017   Current smoker 02/21/2017   AMD (age-related macular degeneration), bilateral 11/10/2016   Insomnia 07/20/2016   Restless legs 07/20/2016  Spinal stenosis in cervical region 10/14/2015   Senile purpura ([place]) 04/18/2015   Osteopenia 01/28/2015   Spinal stenosis of lumbar region 01/20/2015   BPH (benign prostatic hyperplasia) 01/20/2015   HTN (hypertension), benign 04/17/2014   Lumbar disc disease 03/05/2014   Past Medical History:  Diagnosis Date   Arthritis    Balance problem    Uses a walker   Blood in urine    Sees urology yearly   Deafness in right ear age  70   Hyperlipidemia    Hypertension    PSVT (paroxysmal supraventricular tachycardia) ([place])    Temporal arteritis ([place]) 2016   Urinary incontinence     Family History  Problem Relation Age of Onset   Colon cancer Brother    Diabetes Brother    Diabetes Brother     Past Surgical History:  Procedure Laterality Date   BACK SURGERY  2010   [place]     CATARACT EXTRACTION Bilateral 07/30/14, 08/13/2014   CERVICAL SPINE SURGERY  09/01/15   C 4-5 , [place]   COLONOSCOPY  12/19/2007   RMR: 1. External hemorrohids, otherwise normal rectum.2. Normal colon. 3. Normal terminal ileum.   COLONOSCOPY N/A 12/13/2014   Procedure: COLONOSCOPY;  Surgeon: Daneil Dolin, MD;  Location: [facility];  Service: Endoscopy;  Laterality: N/A;  11:15 Pt Request Time   KNEE SURGERY Left    around 2000, arthroscopic   TOTAL HIP ARTHROPLASTY Right 02/23/2017   Procedure: TOTAL HIP ARTHROPLASTY ANTERIOR APPROACH;  Surgeon: Hessie Knows, MD;  Location: [facility] ORS;  Service: Orthopedics;  Laterality: Right;   Social History   Occupational History    Comment: retired Software engineer  Tobacco Use   Smoking status: Every Day    Packs/day: 0.50    Types: Cigarettes   Smokeless tobacco: Never   Tobacco comments:    08/18/15 1/2 - 3/4 pack cigarettes daily  Vaping Use   Vaping Use: Never used  Substance and Sexual Activity   Alcohol use: Yes    Alcohol/week: 0.0 standard drinks    Comment: mixed drink 1-2 times per month, social   Drug use: No   Sexual activity: Not on file

## 2022-03-24 ENCOUNTER — Telehealth: Payer: Self-pay | Admitting: *Deleted

## 2022-03-24 DIAGNOSIS — Z7951 Long term (current) use of inhaled steroids: Secondary | ICD-10-CM | POA: Diagnosis not present

## 2022-03-24 DIAGNOSIS — M5136 Other intervertebral disc degeneration, lumbar region: Secondary | ICD-10-CM | POA: Diagnosis not present

## 2022-03-24 DIAGNOSIS — M4802 Spinal stenosis, cervical region: Secondary | ICD-10-CM | POA: Diagnosis not present

## 2022-03-24 DIAGNOSIS — M8580 Other specified disorders of bone density and structure, unspecified site: Secondary | ICD-10-CM | POA: Diagnosis not present

## 2022-03-24 DIAGNOSIS — N1831 Chronic kidney disease, stage 3a: Secondary | ICD-10-CM | POA: Diagnosis not present

## 2022-03-24 DIAGNOSIS — E785 Hyperlipidemia, unspecified: Secondary | ICD-10-CM | POA: Diagnosis not present

## 2022-03-24 DIAGNOSIS — I471 Supraventricular tachycardia: Secondary | ICD-10-CM | POA: Diagnosis not present

## 2022-03-24 DIAGNOSIS — Z9981 Dependence on supplemental oxygen: Secondary | ICD-10-CM | POA: Diagnosis not present

## 2022-03-24 DIAGNOSIS — S301XXD Contusion of abdominal wall, subsequent encounter: Secondary | ICD-10-CM | POA: Diagnosis not present

## 2022-03-24 DIAGNOSIS — E876 Hypokalemia: Secondary | ICD-10-CM | POA: Diagnosis not present

## 2022-03-24 DIAGNOSIS — G4700 Insomnia, unspecified: Secondary | ICD-10-CM | POA: Diagnosis not present

## 2022-03-24 DIAGNOSIS — F17210 Nicotine dependence, cigarettes, uncomplicated: Secondary | ICD-10-CM | POA: Diagnosis not present

## 2022-03-24 DIAGNOSIS — M19012 Primary osteoarthritis, left shoulder: Secondary | ICD-10-CM | POA: Diagnosis not present

## 2022-03-24 DIAGNOSIS — J432 Centrilobular emphysema: Secondary | ICD-10-CM | POA: Diagnosis not present

## 2022-03-24 DIAGNOSIS — I129 Hypertensive chronic kidney disease with stage 1 through stage 4 chronic kidney disease, or unspecified chronic kidney disease: Secondary | ICD-10-CM | POA: Diagnosis not present

## 2022-03-24 DIAGNOSIS — S46002D Unspecified injury of muscle(s) and tendon(s) of the rotator cuff of left shoulder, subsequent encounter: Secondary | ICD-10-CM | POA: Diagnosis not present

## 2022-03-24 DIAGNOSIS — W19XXXD Unspecified fall, subsequent encounter: Secondary | ICD-10-CM | POA: Diagnosis not present

## 2022-03-24 DIAGNOSIS — H3530 Unspecified macular degeneration: Secondary | ICD-10-CM | POA: Diagnosis not present

## 2022-03-24 DIAGNOSIS — Z79891 Long term (current) use of opiate analgesic: Secondary | ICD-10-CM | POA: Diagnosis not present

## 2022-03-24 DIAGNOSIS — Z7982 Long term (current) use of aspirin: Secondary | ICD-10-CM | POA: Diagnosis not present

## 2022-03-24 DIAGNOSIS — M48061 Spinal stenosis, lumbar region without neurogenic claudication: Secondary | ICD-10-CM | POA: Diagnosis not present

## 2022-03-24 DIAGNOSIS — J449 Chronic obstructive pulmonary disease, unspecified: Secondary | ICD-10-CM | POA: Diagnosis not present

## 2022-03-24 DIAGNOSIS — G2581 Restless legs syndrome: Secondary | ICD-10-CM | POA: Diagnosis not present

## 2022-03-24 DIAGNOSIS — Z9181 History of falling: Secondary | ICD-10-CM | POA: Diagnosis not present

## 2022-03-24 NOTE — Telephone Encounter (Signed)
Appreciate the info If he does need an office visit tomorrow we can do an office visit

## 2022-03-24 NOTE — Telephone Encounter (Signed)
FYI from home health:  Patient reported this morning around 4am his back was hurting him so bad he could not sleep so he got up and went to living room and sat on the arm of his chair and the chair tipped over and he fell. Patient reports no injury and home health sees no evidence of injury but wanted to report the incident.

## 2022-03-31 ENCOUNTER — Other Ambulatory Visit: Payer: Self-pay | Admitting: Family Medicine

## 2022-04-01 DIAGNOSIS — S46002D Unspecified injury of muscle(s) and tendon(s) of the rotator cuff of left shoulder, subsequent encounter: Secondary | ICD-10-CM | POA: Diagnosis not present

## 2022-04-01 DIAGNOSIS — M8580 Other specified disorders of bone density and structure, unspecified site: Secondary | ICD-10-CM | POA: Diagnosis not present

## 2022-04-01 DIAGNOSIS — E876 Hypokalemia: Secondary | ICD-10-CM | POA: Diagnosis not present

## 2022-04-01 DIAGNOSIS — I129 Hypertensive chronic kidney disease with stage 1 through stage 4 chronic kidney disease, or unspecified chronic kidney disease: Secondary | ICD-10-CM | POA: Diagnosis not present

## 2022-04-01 DIAGNOSIS — Z7951 Long term (current) use of inhaled steroids: Secondary | ICD-10-CM | POA: Diagnosis not present

## 2022-04-01 DIAGNOSIS — Z9981 Dependence on supplemental oxygen: Secondary | ICD-10-CM | POA: Diagnosis not present

## 2022-04-01 DIAGNOSIS — Z7982 Long term (current) use of aspirin: Secondary | ICD-10-CM | POA: Diagnosis not present

## 2022-04-01 DIAGNOSIS — G2581 Restless legs syndrome: Secondary | ICD-10-CM | POA: Diagnosis not present

## 2022-04-01 DIAGNOSIS — I471 Supraventricular tachycardia: Secondary | ICD-10-CM | POA: Diagnosis not present

## 2022-04-01 DIAGNOSIS — M19012 Primary osteoarthritis, left shoulder: Secondary | ICD-10-CM | POA: Diagnosis not present

## 2022-04-01 DIAGNOSIS — G4700 Insomnia, unspecified: Secondary | ICD-10-CM | POA: Diagnosis not present

## 2022-04-01 DIAGNOSIS — H3530 Unspecified macular degeneration: Secondary | ICD-10-CM | POA: Diagnosis not present

## 2022-04-01 DIAGNOSIS — M48061 Spinal stenosis, lumbar region without neurogenic claudication: Secondary | ICD-10-CM | POA: Diagnosis not present

## 2022-04-01 DIAGNOSIS — N1831 Chronic kidney disease, stage 3a: Secondary | ICD-10-CM | POA: Diagnosis not present

## 2022-04-01 DIAGNOSIS — Z9181 History of falling: Secondary | ICD-10-CM | POA: Diagnosis not present

## 2022-04-01 DIAGNOSIS — F17210 Nicotine dependence, cigarettes, uncomplicated: Secondary | ICD-10-CM | POA: Diagnosis not present

## 2022-04-01 DIAGNOSIS — Z79891 Long term (current) use of opiate analgesic: Secondary | ICD-10-CM | POA: Diagnosis not present

## 2022-04-01 DIAGNOSIS — E785 Hyperlipidemia, unspecified: Secondary | ICD-10-CM | POA: Diagnosis not present

## 2022-04-01 DIAGNOSIS — S301XXD Contusion of abdominal wall, subsequent encounter: Secondary | ICD-10-CM | POA: Diagnosis not present

## 2022-04-01 DIAGNOSIS — M5136 Other intervertebral disc degeneration, lumbar region: Secondary | ICD-10-CM | POA: Diagnosis not present

## 2022-04-01 DIAGNOSIS — W19XXXD Unspecified fall, subsequent encounter: Secondary | ICD-10-CM | POA: Diagnosis not present

## 2022-04-01 DIAGNOSIS — J432 Centrilobular emphysema: Secondary | ICD-10-CM | POA: Diagnosis not present

## 2022-04-01 DIAGNOSIS — M4802 Spinal stenosis, cervical region: Secondary | ICD-10-CM | POA: Diagnosis not present

## 2022-04-13 ENCOUNTER — Ambulatory Visit (HOSPITAL_COMMUNITY): Payer: Medicare Other

## 2022-04-13 DIAGNOSIS — M47816 Spondylosis without myelopathy or radiculopathy, lumbar region: Secondary | ICD-10-CM | POA: Diagnosis not present

## 2022-04-16 ENCOUNTER — Ambulatory Visit (HOSPITAL_COMMUNITY)
Admission: RE | Admit: 2022-04-16 | Discharge: 2022-04-16 | Disposition: A | Discharge status: Home / Self Care | Payer: Medicare Other | Source: Ambulatory Visit | Attending: Family Medicine | Admitting: Family Medicine

## 2022-04-16 DIAGNOSIS — R0609 Other forms of dyspnea: Secondary | ICD-10-CM | POA: Insufficient documentation

## 2022-04-16 DIAGNOSIS — J441 Chronic obstructive pulmonary disease with (acute) exacerbation: Secondary | ICD-10-CM | POA: Insufficient documentation

## 2022-04-16 LAB — PULMONARY FUNCTION TEST
DL/VA % pred: 67 %
DL/VA: 2.61 ml/min/mmHg/L
DLCO unc % pred: 47 %
DLCO unc: 10.55 ml/min/mmHg
FEF 25-75 Post: 1.43 L/sec
FEF 25-75 Pre: 1.4 L/sec
FEF2575-%Change-Post: 2 %
FEF2575-%Pred-Post: 89 %
FEF2575-%Pred-Pre: 87 %
FEV1-%Change-Post: 0 %
FEV1-%Pred-Post: 74 %
FEV1-%Pred-Pre: 75 %
FEV1-Post: 1.84 L
FEV1-Pre: 1.84 L
FEV1FVC-%Change-Post: 4 %
FEV1FVC-%Pred-Pre: 106 %
FEV6-%Change-Post: -3 %
FEV6-%Pred-Post: 71 %
FEV6-%Pred-Pre: 74 %
FEV6-Post: 2.34 L
FEV6-Pre: 2.42 L
FEV6FVC-%Change-Post: 0 %
FEV6FVC-%Pred-Post: 108 %
FEV6FVC-%Pred-Pre: 107 %
FVC-%Change-Post: -4 %
FVC-%Pred-Post: 66 %
FVC-%Pred-Pre: 69 %
FVC-Post: 2.34 L
FVC-Pre: 2.44 L
Post FEV1/FVC ratio: 78 %
Post FEV6/FVC ratio: 100 %
Pre FEV1/FVC ratio: 75 %
Pre FEV6/FVC Ratio: 99 %
RV % pred: 91 %
RV: 2.4 L
TLC % pred: 71 %
TLC: 4.79 L

## 2022-04-16 MED ORDER — ALBUTEROL SULFATE (2.5 MG/3ML) 0.083% IN NEBU
2.5000 mg | INHALATION_SOLUTION | Freq: Once | RESPIRATORY_TRACT | Status: AC
  Administered 2022-04-16: 2.5 mg via RESPIRATORY_TRACT

## 2022-04-19 ENCOUNTER — Ambulatory Visit (INDEPENDENT_AMBULATORY_CARE_PROVIDER_SITE_OTHER): Payer: Medicare Other | Admitting: Family Medicine

## 2022-04-19 VITALS — BP 125/66 | HR 59 | Temp 97.5°F | Wt 205.0 lb

## 2022-04-19 DIAGNOSIS — R600 Localized edema: Secondary | ICD-10-CM | POA: Diagnosis not present

## 2022-04-19 DIAGNOSIS — E876 Hypokalemia: Secondary | ICD-10-CM | POA: Diagnosis not present

## 2022-04-19 DIAGNOSIS — R0609 Other forms of dyspnea: Secondary | ICD-10-CM

## 2022-04-19 DIAGNOSIS — J8410 Pulmonary fibrosis, unspecified: Secondary | ICD-10-CM | POA: Diagnosis not present

## 2022-04-19 MED ORDER — HYDROCHLOROTHIAZIDE 25 MG PO TABS: ORAL_TABLET | ORAL | 5 refills | Status: DC

## 2022-04-19 NOTE — Progress Notes (Signed)
   Subjective:    Patient ID: Gerald Hall, male    DOB: [birth year], 87 y.o.   MRN: [id number]  HPI 2 month follow up for Left shoulder and lumbar pain Ankle swelling  General oxygen questions on 2 L- w  Review of Systems     Objective:   Physical Exam  General-in no acute distress Eyes-no discharge Lungs-respiratory rate normal, CTA-distant distant air sounds no respiratory distress on 2 L of O2 CV-no murmurs,RRR Extremities skin warm dry no edema Neuro grossly normal Behavior normal, alert       Assessment & Plan:  1. DOE (dyspnea on exertion) I suspect that this could be underlying pulmonary fibrosis pulmonary function test showed a possibility of fibrosis going on therefore we need to do CT scan - Basic metabolic panel - Magnesium - Brain natriuretic peptide  2. Pedal edema We need to rule out the possibility of heart failure and ejection fraction was 40-45% check BNP Bump up dose of HCTZ to 25 mg check labs within 10 to 14 days - Basic metabolic panel - Magnesium - Brain natriuretic peptide  3. Hypokalemia Check labs within 10 to 14 days continue current medication with bump up dose of HCTZ and potassium - Basic metabolic panel - Magnesium - Brain natriuretic peptide  4. Pulmonary fibrosis (HCC) CT scan recommended because of abnormal pulmonary function test along with dyspnea on exertion and hypoxia no COPD on pulmonary function test - CT Chest Wo Contrast  Follow-up 2 months  Should be noted that patient has been counseled to quit smoking but has no desire to do so currently

## 2022-04-21 ENCOUNTER — Ambulatory Visit: Payer: Medicare Other | Admitting: Physical Medicine and Rehabilitation

## 2022-04-21 ENCOUNTER — Encounter: Payer: Self-pay | Admitting: Physical Medicine and Rehabilitation

## 2022-04-21 ENCOUNTER — Ambulatory Visit: Payer: Medicare Other

## 2022-04-21 VITALS — BP 167/77 | HR 68

## 2022-04-21 DIAGNOSIS — M961 Postlaminectomy syndrome, not elsewhere classified: Secondary | ICD-10-CM

## 2022-04-21 DIAGNOSIS — M5416 Radiculopathy, lumbar region: Secondary | ICD-10-CM | POA: Diagnosis not present

## 2022-04-21 DIAGNOSIS — M5116 Intervertebral disc disorders with radiculopathy, lumbar region: Secondary | ICD-10-CM

## 2022-04-21 MED ORDER — METHYLPREDNISOLONE ACETATE 80 MG/ML IJ SUSP
80.0000 mg | Freq: Once | INTRAMUSCULAR | Status: AC
  Administered 2022-04-21: 80 mg

## 2022-04-21 NOTE — Patient Instructions (Signed)

## 2022-04-21 NOTE — Progress Notes (Signed)
Pt state lower back pain that travels down both legs. Pt state walking and standing makes the pain worse. Pt state he take [place] meds and uses heat and ice to help eas ehis pain.  Numeric Pain Rating Scale and Functional Assessment Average Pain 1   In the last MONTH (on 0-10 scale) has pain interfered with the following?  1. General activity like being  able to carry out your everyday physical activities such as walking, climbing stairs, carrying groceries, or moving a chair?  Rating(8)   +Driver, -BT, -Dye Allergies.

## 2022-04-23 DIAGNOSIS — J449 Chronic obstructive pulmonary disease, unspecified: Secondary | ICD-10-CM | POA: Diagnosis not present

## 2022-05-10 ENCOUNTER — Telehealth: Payer: Self-pay | Admitting: Physical Medicine and Rehabilitation

## 2022-05-10 ENCOUNTER — Other Ambulatory Visit (HOSPITAL_COMMUNITY): Payer: Medicare Other

## 2022-05-10 ENCOUNTER — Encounter (HOSPITAL_COMMUNITY): Payer: Self-pay

## 2022-05-10 NOTE — Telephone Encounter (Signed)
Patient called wanting Dr. Ernestina Patches to know he is a Librarian, academic and would like to speak with him to thank him. Patient said he feels great. The number to contact patient is [phone]

## 2022-05-11 NOTE — Procedures (Signed)
Lumbar Epidural Steroid Injection - Interlaminar Approach with Fluoroscopic Guidance  Patient: Gerald Hall      Date of Birth: 08/12/1935 MRN: [id number] PCP: Kathyrn Drown, MD      Visit Date: 04/21/2022   Universal Protocol:     Consent Given By: the patient  Position: PRONE  Additional Comments: Vital signs were monitored before and after the procedure. Patient was prepped and draped in the usual sterile fashion. The correct patient, procedure, and site was verified.   Injection Procedure Details:   Procedure diagnoses: Lumbar radiculopathy [M54.16]   Meds Administered:  Meds ordered this encounter  Medications   methylPREDNISolone acetate (DEPO-MEDROL) injection 80 mg     Laterality: Midline  Location/Site:  L3-4  Needle: 3.5 in., 20 ga. Tuohy  Needle Placement: Paramedian epidural  Findings:   -Comments: Excellent flow of contrast into the epidural space.  Procedure Details: Using a paramedian approach from the side mentioned above, the region overlying the inferior lamina was localized under fluoroscopic visualization and the soft tissues overlying this structure were infiltrated with 4 ml. of 1% Lidocaine without Epinephrine. The Tuohy needle was inserted into the epidural space using a paramedian approach.   The epidural space was localized using loss of resistance along with counter oblique bi-planar fluoroscopic views.  After negative aspirate for air, blood, and CSF, a 2 ml. volume of Isovue-250 was injected into the epidural space and the flow of contrast was observed. Radiographs were obtained for documentation purposes.    The injectate was administered into the level noted above.   Additional Comments:  The patient tolerated the procedure well Dressing: 2 x 2 sterile gauze and Band-Aid    Post-procedure details: Patient was observed during the procedure. Post-procedure instructions were reviewed.  Patient left the clinic in stable  condition.

## 2022-05-11 NOTE — Progress Notes (Signed)
Gerald Hall - 87 y.o. male MRN [id number]  Date of birth: May 21, 1935  Office Visit Note: Visit Date: 04/21/2022 PCP: Kathyrn Drown, MD Referred by: Kathyrn Drown, MD  Subjective: Chief Complaint  Patient presents with   Lower Back - Pain   Right Leg - Pain   Left Leg - Pain   HPI:  Gerald Hall is a 87 y.o. male who comes in today at the request of Annie Main, PA-C for planned Midline L3-4 Lumbar Interlaminar epidural steroid injection with fluoroscopic guidance.  The patient has failed conservative care including home exercise, medications, time and activity modification.  This injection will be diagnostic and hopefully therapeutic.  Please see requesting physician notes for further details and justification.   ROS Otherwise per HPI.  Assessment & Plan: Visit Diagnoses:    ICD-10-CM   1. Lumbar radiculopathy  M54.16 XR C-ARM NO REPORT    Epidural Steroid injection    methylPREDNISolone acetate (DEPO-MEDROL) injection 80 mg    2. Radiculopathy due to lumbar intervertebral disc disorder  M51.16     3. Post laminectomy syndrome  M96.1       Plan: No additional findings.   Meds & Orders:  Meds ordered this encounter  Medications   methylPREDNISolone acetate (DEPO-MEDROL) injection 80 mg    Orders Placed This Encounter  Procedures   XR C-ARM NO REPORT   Epidural Steroid injection    Follow-up: Return for visit to requesting provider as needed.   Procedures: No procedures performed  Lumbar Epidural Steroid Injection - Interlaminar Approach with Fluoroscopic Guidance  Patient: Gerald Hall      Date of Birth: 1935-02-14 MRN: [id number] PCP: Kathyrn Drown, MD      Visit Date: 04/21/2022   Universal Protocol:     Consent Given By: the patient  Position: PRONE  Additional Comments: Vital signs were monitored before and after the procedure. Patient was prepped and draped in the usual sterile fashion. The correct patient, procedure, and site was  verified.   Injection Procedure Details:   Procedure diagnoses: Lumbar radiculopathy [M54.16]   Meds Administered:  Meds ordered this encounter  Medications   methylPREDNISolone acetate (DEPO-MEDROL) injection 80 mg     Laterality: Midline  Location/Site:  L3-4  Needle: 3.5 in., 20 ga. Tuohy  Needle Placement: Paramedian epidural  Findings:   -Comments: Excellent flow of contrast into the epidural space.  Procedure Details: Using a paramedian approach from the side mentioned above, the region overlying the inferior lamina was localized under fluoroscopic visualization and the soft tissues overlying this structure were infiltrated with 4 ml. of 1% Lidocaine without Epinephrine. The Tuohy needle was inserted into the epidural space using a paramedian approach.   The epidural space was localized using loss of resistance along with counter oblique bi-planar fluoroscopic views.  After negative aspirate for air, blood, and CSF, a 2 ml. volume of Isovue-250 was injected into the epidural space and the flow of contrast was observed. Radiographs were obtained for documentation purposes.    The injectate was administered into the level noted above.   Additional Comments:  The patient tolerated the procedure well Dressing: 2 x 2 sterile gauze and Band-Aid    Post-procedure details: Patient was observed during the procedure. Post-procedure instructions were reviewed.  Patient left the clinic in stable condition.   Clinical History: MRI LUMBAR SPINE WITHOUT CONTRAST   TECHNIQUE: Multiplanar, multisequence MR imaging of the lumbar spine was performed. No intravenous contrast was  administered.   COMPARISON:  Lumbar spine x-rays dated February 12, 2022. MRI lumbar spine dated February 07, 2015.   FINDINGS: Segmentation:  Standard.   Alignment: Unchanged 5 mm anterolisthesis at L4-L5. Unchanged trace retrolisthesis at L5-S1.   Vertebrae: No fracture, evidence of discitis, or bone  lesion. New degenerative endplate marrow edema at L2-L3.   Conus medullaris and cauda equina: Conus extends to the L1 level. Conus and cauda equina appear normal.   Paraspinal and other soft tissues: Bilateral renal simple cysts. No follow-up imaging is recommended. Otherwise negative.   Disc levels:   T12-L1:  Unchanged minimal disc bulging.  No stenosis.   L1-L2:  Unchanged mild disc bulging.  No stenosis.   L2-L3: Progressive moderate disc bulging and endplate spurring with new superimposed small right subarticular extruded disc fragment posterior to the L3 superior endplate (series 13, image 18; series 5, image 6). Progressive bilateral facet arthropathy. Worsened now severe spinal canal stenosis and moderate bilateral neuroforaminal stenosis.   L3-L4: Progressive moderate disc bulging and bilateral facet arthropathy with worsened now moderate spinal canal stenosis. Unchanged mild bilateral neuroforaminal stenosis.   L4-L5: Prior posterior decompression. Progressive moderate disc bulging with new superimposed left subarticular disc extrusion (series 13, image 28; series 5, image 9). Continued large left foraminal disc protrusion. Progressive severe bilateral facet arthropathy. New severe left lateral recess stenosis. Unchanged severe left and mild right neuroforaminal stenosis.   L5-S1: Unchanged small circumferential disc osteophyte complex and mild bilateral facet arthropathy. Unchanged mild bilateral neuroforaminal stenosis. No spinal canal stenosis.   IMPRESSION: 1. Progressive lumbar spondylosis as described above. New severe spinal canal stenosis and moderate bilateral neuroforaminal stenosis at L2-L3. 2. Progressive now moderate spinal canal stenosis at L3-L4. 3. New severe left lateral recess stenosis at L4-L5 due to new large left subarticular disc extrusion. Unchanged large left foraminal disc protrusion at this level with severe left  neuroforaminal stenosis.     Electronically Signed   By: Titus Dubin M.D.   On: 03/15/2022 10:27     Objective:  VS:  HT:    WT:   BMI:     BP:(!) 167/77  HR:68bpm  TEMP: ( )  RESP:  Physical Exam Vitals and nursing note reviewed.  Constitutional:      General: He is not in acute distress.    Appearance: Normal appearance. He is not ill-appearing.  HENT:     Head: Normocephalic and atraumatic.     Right Ear: External ear normal.     Left Ear: External ear normal.     Nose: No congestion.  Eyes:     Extraocular Movements: Extraocular movements intact.  Cardiovascular:     Rate and Rhythm: Normal rate.     Pulses: Normal pulses.  Pulmonary:     Effort: Pulmonary effort is normal. No respiratory distress.  Abdominal:     General: There is no distension.     Palpations: Abdomen is soft.  Musculoskeletal:        General: No tenderness or signs of injury.     Cervical back: Neck supple.     Right lower leg: No edema.     Left lower leg: No edema.     Comments: Patient has good distal strength without clonus.  Skin:    Findings: No erythema or rash.  Neurological:     General: No focal deficit present.     Mental Status: He is alert and oriented to person, place, and time.  Sensory: No sensory deficit.     Motor: No weakness or abnormal muscle tone.     Coordination: Coordination normal.  Psychiatric:        Mood and Affect: Mood normal.        Behavior: Behavior normal.      Imaging: No results found.

## 2022-05-13 ENCOUNTER — Other Ambulatory Visit: Payer: Self-pay | Admitting: Family Medicine

## 2022-05-18 ENCOUNTER — Encounter (HOSPITAL_COMMUNITY): Payer: Medicare Other

## 2022-05-24 DIAGNOSIS — J449 Chronic obstructive pulmonary disease, unspecified: Secondary | ICD-10-CM | POA: Diagnosis not present

## 2022-06-01 ENCOUNTER — Ambulatory Visit (HOSPITAL_COMMUNITY)
Admission: RE | Admit: 2022-06-01 | Discharge: 2022-06-01 | Disposition: A | Discharge status: Home / Self Care | Payer: Medicare Other | Source: Ambulatory Visit | Attending: Family Medicine | Admitting: Family Medicine

## 2022-06-01 ENCOUNTER — Encounter: Payer: Self-pay | Admitting: Family Medicine

## 2022-06-01 ENCOUNTER — Other Ambulatory Visit (HOSPITAL_COMMUNITY)
Admission: RE | Admit: 2022-06-01 | Discharge: 2022-06-01 | Disposition: A | Discharge status: Home / Self Care | Payer: Medicare Other | Source: Ambulatory Visit | Attending: *Deleted | Admitting: *Deleted

## 2022-06-01 ENCOUNTER — Ambulatory Visit (INDEPENDENT_AMBULATORY_CARE_PROVIDER_SITE_OTHER): Payer: Medicare Other | Admitting: Family Medicine

## 2022-06-01 VITALS — BP 162/77 | HR 65 | Temp 98.1°F | Ht 68.0 in | Wt 210.0 lb

## 2022-06-01 DIAGNOSIS — J189 Pneumonia, unspecified organism: Secondary | ICD-10-CM | POA: Diagnosis not present

## 2022-06-01 DIAGNOSIS — J449 Chronic obstructive pulmonary disease, unspecified: Secondary | ICD-10-CM

## 2022-06-01 DIAGNOSIS — R0902 Hypoxemia: Secondary | ICD-10-CM | POA: Diagnosis not present

## 2022-06-01 DIAGNOSIS — J441 Chronic obstructive pulmonary disease with (acute) exacerbation: Secondary | ICD-10-CM | POA: Insufficient documentation

## 2022-06-01 DIAGNOSIS — R600 Localized edema: Secondary | ICD-10-CM | POA: Insufficient documentation

## 2022-06-01 DIAGNOSIS — R0602 Shortness of breath: Secondary | ICD-10-CM | POA: Diagnosis not present

## 2022-06-01 LAB — CBC WITH DIFFERENTIAL/PLATELET
Abs Immature Granulocytes: 0.07 10*3/uL (ref 0.00–0.07)
Basophils Absolute: 0 10*3/uL (ref 0.0–0.1)
Basophils Relative: 0 %
Eosinophils Absolute: 0.2 10*3/uL (ref 0.0–0.5)
Eosinophils Relative: 2 %
HCT: 42.8 % (ref 39.0–52.0)
Hemoglobin: 13.6 g/dL (ref 13.0–17.0)
Immature Granulocytes: 1 %
Lymphocytes Relative: 12 %
Lymphs Abs: 1.2 10*3/uL (ref 0.7–4.0)
MCH: 31.1 pg (ref 26.0–34.0)
MCHC: 31.8 g/dL (ref 30.0–36.0)
MCV: 97.7 fL (ref 80.0–100.0)
Monocytes Absolute: 1.4 10*3/uL — ABNORMAL HIGH (ref 0.1–1.0)
Monocytes Relative: 14 %
Neutro Abs: 7.1 10*3/uL (ref 1.7–7.7)
Neutrophils Relative %: 71 %
Platelets: 191 10*3/uL (ref 150–400)
RBC: 4.38 MIL/uL (ref 4.22–5.81)
RDW: 14.9 % (ref 11.5–15.5)
WBC: 10 10*3/uL (ref 4.0–10.5)
nRBC: 0 % (ref 0.0–0.2)

## 2022-06-01 LAB — BASIC METABOLIC PANEL
Anion gap: 6 (ref 5–15)
BUN: 21 mg/dL (ref 8–23)
CO2: 31 mmol/L (ref 22–32)
Calcium: 9 mg/dL (ref 8.9–10.3)
Chloride: 105 mmol/L (ref 98–111)
Creatinine, Ser: 1.36 mg/dL — ABNORMAL HIGH (ref 0.61–1.24)
GFR, Estimated: 51 mL/min — ABNORMAL LOW (ref >60)
Glucose, Bld: 105 mg/dL — ABNORMAL HIGH (ref 70–99)
Potassium: 3.8 mmol/L (ref 3.5–5.1)
Sodium: 142 mmol/L (ref 135–145)

## 2022-06-01 LAB — BRAIN NATRIURETIC PEPTIDE: B Natriuretic Peptide: 260 pg/mL — ABNORMAL HIGH (ref 0.0–100.0)

## 2022-06-01 MED ORDER — CEFPROZIL 500 MG PO TABS
500.0000 mg | ORAL_TABLET | Freq: Two times a day (BID) | ORAL | 0 refills | Status: DC
Start: 2022-06-01 — End: 2022-06-22

## 2022-06-01 MED ORDER — IPRATROPIUM-ALBUTEROL 0.5-2.5 (3) MG/3ML IN SOLN
3.0000 mL | RESPIRATORY_TRACT | 6 refills | Status: DC | PRN
Start: 2022-06-01 — End: 2022-08-27

## 2022-06-01 MED ORDER — TORSEMIDE 10 MG PO TABS: 10.0000 mg | ORAL_TABLET | Freq: Every day | ORAL | 4 refills | Status: DC

## 2022-06-01 MED ORDER — DOXYCYCLINE HYCLATE 100 MG PO TABS: 100.0000 mg | ORAL_TABLET | Freq: Two times a day (BID) | ORAL | 0 refills | Status: DC

## 2022-06-01 NOTE — Patient Instructions (Addendum)
Hi Gerald Hall  It was good to see you today We will call you tonight with your results  First do blood work at the hospital this is checking for infection, your kidney function, and a heart test called BNP that looks for heart failure  Second do your chest x-ray this is to look for any type of fluid in your lungs or pneumonia  Third I sent in your medication to the pharmacy for DuoNeb that you may use by the nebulizer every 4 hours as needed  Fourth stop HCTZ in place of it use torsemide 10 mg each morning  Fifth our staff will set you up for a follow-up visit next week to recheck you  We will talk soon [facility]-Dr. Nicki Reaper

## 2022-06-01 NOTE — Progress Notes (Signed)
   Subjective:    Patient ID: Gerald Hall, male    DOB: 1935/05/04, 87 y.o.   MRN: [id number]  HPI Chest congestion, wheezing, sob, weakness, leg swelling Red blotches on legs  going on x 4 to 5 days Patient has a history of COPD He does smoke and has been counseled to quit In addition to all this he relates ongoing chest congestion wheezing coughing shortness of breath over the past several days at times feels a little bit cold.  Denies high fevers denies chills does relate some intermittent wheezing in addition to the swelling in the legs that he feels is been mildly progressive but present for quite some time he is on oxygen he denies PND denies orthopnea denies chest pressure  Review of Systems     Objective:   Physical Exam General-in no acute distress Eyes-no discharge Lungs-respiratory rate normal, CTA CV-no murmurs,RRR Extremities skin warm dry no edema Neuro grossly normal Behavior normal, alert        Assessment & Plan:  1. COPD with acute exacerbation ([place]) Will go ahead with 2 courses of antibiotics Patient to follow-up if progressive troubles Warning signs discussed with patient O2 saturation okay on 2 L of oxygen may go up to 3 L if necessary will recheck next week ER if worse - DG Chest 2 View - Brain natriuretic peptide - Basic metabolic panel - CBC with Differential/Platelet - Novel Coronavirus, NAA ([facility])  2. Pedal edema BNP ordered came back slightly elevated but I believe that is related to his COPD we will adjust by stopping HCTZ and starting torsemide 10 mg every morning patient will follow-up next week for repeat lab work at that time - DG Chest 2 View - Brain natriuretic peptide - Basic metabolic panel - CBC with Differential/Platelet - Novel Coronavirus, NAA ([facility])  3. COPD with hypoxia ([place]) Continue 2 L per nasal cannula if necessary may move up to 3 no more than that if having to use more than that go to ER or follow-up here  ASAP  4. Community acquired pneumonia of left lower lobe of lung 2 rounds of antibiotics intolerant to penicillins go with Cefzil and doxycycline twice daily

## 2022-06-02 ENCOUNTER — Ambulatory Visit: Payer: Medicare Other | Admitting: "Endocrinology

## 2022-06-02 LAB — NOVEL CORONAVIRUS, NAA: SARS-CoV-2, NAA: NOT DETECTED

## 2022-06-07 ENCOUNTER — Other Ambulatory Visit: Payer: Self-pay | Admitting: "Endocrinology

## 2022-06-07 DIAGNOSIS — E0590 Thyrotoxicosis, unspecified without thyrotoxic crisis or storm: Secondary | ICD-10-CM | POA: Diagnosis not present

## 2022-06-08 LAB — TSH: TSH: 0.376 u[IU]/mL — ABNORMAL LOW (ref 0.450–4.500)

## 2022-06-08 LAB — T3, FREE: T3, Free: 1.8 pg/mL — ABNORMAL LOW (ref 2.0–4.4)

## 2022-06-08 LAB — T4, FREE: Free T4: 1.47 ng/dL (ref 0.82–1.77)

## 2022-06-09 ENCOUNTER — Ambulatory Visit: Payer: Medicare Other | Admitting: "Endocrinology

## 2022-06-10 ENCOUNTER — Ambulatory Visit (INDEPENDENT_AMBULATORY_CARE_PROVIDER_SITE_OTHER): Payer: Medicare Other | Admitting: Family Medicine

## 2022-06-10 ENCOUNTER — Encounter: Payer: Self-pay | Admitting: "Endocrinology

## 2022-06-10 ENCOUNTER — Ambulatory Visit: Payer: Medicare Other | Admitting: "Endocrinology

## 2022-06-10 ENCOUNTER — Encounter: Payer: Self-pay | Admitting: Family Medicine

## 2022-06-10 VITALS — BP 137/77 | HR 57 | Temp 97.3°F | Wt 211.6 lb

## 2022-06-10 VITALS — BP 132/78 | HR 60 | Ht 68.0 in | Wt 210.0 lb

## 2022-06-10 DIAGNOSIS — R600 Localized edema: Secondary | ICD-10-CM | POA: Diagnosis not present

## 2022-06-10 DIAGNOSIS — J449 Chronic obstructive pulmonary disease, unspecified: Secondary | ICD-10-CM | POA: Diagnosis not present

## 2022-06-10 DIAGNOSIS — E0590 Thyrotoxicosis, unspecified without thyrotoxic crisis or storm: Secondary | ICD-10-CM

## 2022-06-10 DIAGNOSIS — R0902 Hypoxemia: Secondary | ICD-10-CM

## 2022-06-10 MED ORDER — METHIMAZOLE 5 MG PO TABS: 5.0000 mg | ORAL_TABLET | Freq: Every day | ORAL | 1 refills | Status: DC

## 2022-06-10 MED ORDER — TORSEMIDE 20 MG PO TABS: 20.0000 mg | ORAL_TABLET | Freq: Every day | ORAL | 5 refills | Status: DC

## 2022-06-10 NOTE — Progress Notes (Signed)
   Subjective:    Patient ID: Gerald Hall, male    DOB: 1935/01/30, 87 y.o.   MRN: [id number]  HPI Pt arrives for follow up on COPD. Pt was seen last week. Pt states he is still short of breath, tired, chest congestion and ankle swelling. Pt has red rash area on right leg.  He does have the underlying trouble of recent COPD flareup having some cough congestion we will do a follow-up in the near future  Review of Systems     Objective:   Physical Exam  General-in no acute distress Eyes-no discharge Lungs-respiratory rate normal, CTA CV-no murmurs,RRR Extremities skin warm dry no edema Neuro grossly normal Behavior normal, alert Pedal edema I believe is more dependent I do not feel he has CHF His BNP was elevated we will bump up the dose of the torsemide to 20 mg We will repeat the metabolic 7 in 7 to 10 days     Assessment & Plan:   Patient has follow-up again in a few weeks time I think it is wise for him to keep that his health is somewhat fragile

## 2022-06-10 NOTE — Progress Notes (Signed)
06/10/2022, 6:43 PM  Endocrinology follow-up note   Subjective:    Patient ID: Gerald Hall, male    DOB: 1935-10-19, PCP Kathyrn Drown, MD   Past Medical History:  Diagnosis Date   Arthritis    Balance problem    Uses a walker   Blood in urine    Sees urology yearly   Deafness in right ear age 87   Hyperlipidemia    Hypertension    PSVT (paroxysmal supraventricular tachycardia) ([place])    Temporal arteritis ([place]) 2016   Urinary incontinence    Past Surgical History:  Procedure Laterality Date   BACK SURGERY  2010   [place]     CATARACT EXTRACTION Bilateral 07/30/14, 08/13/2014   CERVICAL SPINE SURGERY  09/01/15   C 4-5 , [facility] , [place]   COLONOSCOPY  12/19/2007   RMR: 1. External hemorrohids, otherwise normal rectum.2. Normal colon. 3. Normal terminal ileum.   COLONOSCOPY N/A 12/13/2014   Procedure: COLONOSCOPY;  Surgeon: Daneil Dolin, MD;  Location: [facility];  Service: Endoscopy;  Laterality: N/A;  11:15 Pt Request Time   KNEE SURGERY Left    around 2000, arthroscopic   TOTAL HIP ARTHROPLASTY Right 02/23/2017   Procedure: TOTAL HIP ARTHROPLASTY ANTERIOR APPROACH;  Surgeon: Hessie Knows, MD;  Location: [facility] ORS;  Service: Orthopedics;  Laterality: Right;   Social History   Socioeconomic History   Marital status: Widowed    Spouse name: Pamala Hurry   Number of children: 2   Years of education: 16   Highest education level: Not on file  Occupational History    Comment: retired Software engineer  Tobacco Use   Smoking status: Every Day    Packs/day: 0.50    Types: Cigarettes   Smokeless tobacco: Never   Tobacco comments:    08/18/15 1/2 - 3/4 pack cigarettes daily  Vaping Use   Vaping Use: Never used  Substance and Sexual Activity   Alcohol use: Yes    Alcohol/week: 0.0 standard drinks of alcohol    Comment: mixed drink 1-2 times per month, social   Drug use: No   Sexual activity: Not  on file  Other Topics Concern   Not on file  Social History Narrative   Lives at home. Has home health aids to assist with housekeeping/appointments.   Caffeine use- coffee 8-10 cups daily   2 daughters, one lives locally, other lives in [place].   2 granddaughters.   1 great granddaughter-age 75.   Social Determinants of Health   Financial Resource Strain: Low Risk  (03/16/2022)   Overall Financial Resource Strain (CARDIA)    Difficulty of Paying Living Expenses: Not hard at all  Food Insecurity: No Food Insecurity (03/16/2022)   Hunger Vital Sign    Worried About Running Out of Food in the Last Year: Never true    Ran Out of Food in the Last Year: Never true  Transportation Needs: No Transportation Needs (03/16/2022)   PRAPARE - Hydrologist (Medical): No    Lack of Transportation (Non-Medical): No  Physical Activity: Sufficiently Active (03/16/2022)   Exercise Vital Sign    Days of Exercise per Week: 3 days  Minutes of Exercise per Session: 60 min  Stress: No Stress Concern Present (03/16/2022)   [place]    Feeling of Stress : Only a little  Social Connections: Moderately Integrated (03/16/2022)   Social Connection and Isolation Panel [NHANES]    Frequency of Communication with Friends and Family: More than three times a week    Frequency of Social Gatherings with Friends and Family: More than three times a week    Attends Religious Services: More than 4 times per year    Active Member of [employer] or Organizations: Yes    Attends Archivist Meetings: More than 4 times per year    Marital Status: Widowed   Family History  Problem Relation Age of Onset   Colon cancer Brother    Diabetes Brother    Diabetes Brother    Outpatient Encounter Medications as of 06/10/2022  Medication Sig   acetaminophen (TYLENOL) 325 MG tablet Take 1,000 mg by mouth every 6 (six) hours as needed for  mild pain or headache.   albuterol (VENTOLIN HFA) 108 (90 Base) MCG/ACT inhaler Inhale 2 puffs into the lungs every 4 (four) hours as needed for wheezing.   amiodarone (PACERONE) 200 MG tablet TAKE 1 TABLET BY MOUTH ONCE DAILY.   aspirin EC 325 MG EC tablet Take 1 tablet (325 mg total) by mouth daily.   calcium carbonate (OS-CAL) 600 MG TABS tablet Take 600 mg by mouth daily with breakfast.   carvedilol (COREG) 3.125 MG tablet TAKE (1) TABLET BY MOUTH TWICE A DAY WITH MEALS (BREAKFAST AND SUPPER)   cefPROZIL (CEFZIL) 500 MG tablet Take 1 tablet (500 mg total) by mouth 2 (two) times daily.   cholecalciferol (VITAMIN D) 400 units TABS tablet Take 1,000 Units by mouth daily.   Dextromethorphan-guaiFENesin (MUCINEX DM) 30-600 MG TB12 Take 1 tablet by mouth daily.    finasteride (PROSCAR) 5 MG tablet TAKE 1 TABLET BY MOUTH ONCE DAILY.   fluticasone (FLONASE) 50 MCG/ACT nasal spray Place 2 sprays into both nostrils at bedtime.   ipratropium-albuterol (DUONEB) 0.5-2.5 (3) MG/3ML SOLN Take 3 mLs by nebulization every 4 (four) hours as needed.   methimazole (TAPAZOLE) 5 MG tablet Take 1 tablet (5 mg total) by mouth daily.   mirabegron ER (MYRBETRIQ) 50 MG TB24 tablet Take 50 mg by mouth daily.   OXYGEN Inhale 2 L into the lungs continuous.   oxymetazoline (AFRIN) 0.05 % nasal spray Place 1 spray into both nostrils at bedtime.   polyethylene glycol (MIRALAX / GLYCOLAX) 17 g packet Take 17 g by mouth daily as needed.   polyvinyl alcohol (LIQUIFILM TEARS) 1.4 % ophthalmic solution Place 1 drop into both eyes as needed for dry eyes.   potassium chloride (KLOR-CON) 10 MEQ tablet TAKE 1 TABLET BY MOUTH TWICE DAILY   potassium chloride (KLOR-CON) 10 MEQ tablet TAKE 1 TABLET BY MOUTH TWICE DAILY   pregabalin (LYRICA) 25 MG capsule Take 1 capsule (25 mg total) by mouth 2 (two) times daily.   rOPINIRole (REQUIP) 2 MG tablet TAKE 1 TABLET BY MOUTH AT LATE AFTERNOON AND AT BEDTIME (Patient taking differently: Take 2  mg by mouth at bedtime.)   sodium chloride (OCEAN) 0.65 % SOLN nasal spray Place 1 spray into both nostrils as needed for congestion.   SYMBICORT 160-4.5 MCG/ACT inhaler INHALE 2 PUFFS BY MOUTH TWICE DAILY   tamsulosin (FLOMAX) 0.4 MG CAPS capsule TAKE (1) CAPSULE BY MOUTH TWICE DAILY.   torsemide (  DEMADEX) 20 MG tablet Take 1 tablet (20 mg total) by mouth daily.   traMADol (ULTRAM) 50 MG tablet TAKE (1) TABLET BY MOUTH EVERY TWELVE HOURS AS NEEDED. (Patient taking differently: Take 50 mg by mouth 2 (two) times daily as needed for moderate pain.)   vitamin C (ASCORBIC ACID) 500 MG tablet Take 500 mg by mouth daily.   Zinc Acetate, Oral, (ZINC ACETATE PO) Take 1 tablet by mouth daily.   [DISCONTINUED] methimazole (TAPAZOLE) 5 MG tablet Take 1 tablet (5 mg total) by mouth daily.   No facility-administered encounter medications on file as of 06/10/2022.   ALLERGIES: Allergies  Allergen Reactions   Cephalexin Diarrhea    And general weakness   Penicillins     GI UPSET     VACCINATION STATUS: Immunization History  Administered Date(s) Administered   Fluad Quad(high Dose 65+) 08/12/2020, 08/10/2021   Influenza,inj,Quad PF,6+ Mos 07/20/2016, 08/07/2018, 08/22/2019   Influenza-Unspecified 09/03/2013   Moderna Sars-Covid-2 Vaccination 10/30/2019, 11/27/2019, 08/28/2020   Pneumococcal Conjugate-13 09/03/2014   Pneumococcal Polysaccharide-23 07/20/2016   Zoster Recombinat (Shingrix) 09/26/2017, 02/23/2018    HPI ADRIEL KESSEN is 87 y.o. male who presents today with repeat thyroid function tests.  He was seen in consultation last year due to abnormal thyroid function tests consistent with transient thyrotoxicosis related to thyroiditis.  He was put on low-dose methimazole due to clinical symptoms.  He remains on treatment with amiodarone due to cardiac dysrhythmia.   His previsit labs are consistent with treatment effect.  He presents with symptomatic improvement clinically.  He denies  palpitations, current pulse rate is 60.  He is currently on 200 mg of amiodarone daily.   -  His thyroid uptake and scan on September 09, 2020 revealed low uptake of 1.3% in 24 hours. -His pre-visit labs are c/w significant hyperthyroidism.  He remains active smoker.  He is currently being treated for squamous cell carcinoma of the skin over his left upper extremity.  He does not have acute complaints today.  Review of Systems Limited as above.  Objective:       06/10/2022   12:59 PM 06/10/2022   12:18 PM 06/10/2022   11:16 AM  Vitals with BMI  Height '5\' 8"'$     Weight 210 lbs  211 lbs 10 oz  BMI 19.14  78.29  Systolic 562 130 865  Diastolic 78 77 77  Pulse 60  57    BP 132/78   Pulse 60   Ht '5\' 8"'$  (1.727 m)   Wt 210 lb (95.3 kg)   BMI 31.93 kg/m   Wt Readings from Last 3 Encounters:  06/10/22 210 lb (95.3 kg)  06/10/22 211 lb 9.6 oz (96 kg)  06/01/22 210 lb (95.3 kg)    Physical Exam  Constitutional:  Body mass index is 31.93 kg/m.,  not in acute distress, normal state of mind, + relates with a walker. Eyes: PERRLA, EOMI, no exophthalmos   CMP ( most recent) CMP     Component Value Date/Time   NA 142 06/01/2022 1729   NA 140 07/16/2021 1105   K 3.8 06/01/2022 1729   CL 105 06/01/2022 1729   CO2 31 06/01/2022 1729   GLUCOSE 105 (H) 06/01/2022 1729   BUN 21 06/01/2022 1729   BUN 30 (H) 07/16/2021 1105   CREATININE 1.36 (H) 06/01/2022 1729   CREATININE 1.14 09/06/2014 1259   CALCIUM 9.0 06/01/2022 1729   PROT 6.8 11/21/2021 0445   PROT 6.6 07/16/2021 1105  ALBUMIN 3.5 11/21/2021 0445   ALBUMIN 4.1 07/16/2021 1105   AST 18 11/21/2021 0445   ALT 23 11/21/2021 0445   ALKPHOS 73 11/21/2021 0445   BILITOT 0.3 11/21/2021 0445   BILITOT 0.5 07/16/2021 1105   GFRNONAA 51 (L) 06/01/2022 1729   GFRAA 48 (L) 11/24/2020 1519     Diabetic Labs (most recent): Lab Results  Component Value Date   HGBA1C 5.6 08/18/2015     Lipid Panel ( most recent) Lipid Panel      Component Value Date/Time   CHOL 183 08/05/2020 0932   TRIG 113 08/05/2020 0932   HDL 40 08/05/2020 0932   CHOLHDL 4.6 08/05/2020 0932   CHOLHDL 3.7 09/06/2014 1259   VLDL 29 09/06/2014 1259   LDLCALC 122 (H) 08/05/2020 0932   LABVLDL 21 08/05/2020 0932      Lab Results  Component Value Date   TSH 0.376 (L) 06/07/2022   TSH 0.186 (L) 03/01/2022   TSH 0.109 (L) 12/01/2021   TSH 0.578 05/26/2021   TSH 0.482 02/20/2021   TSH 0.48 02/20/2021   TSH 0.652 11/24/2020   TSH 0.379 (L) 08/05/2020   TSH 0.658 05/21/2020   TSH 0.376 (L) 01/23/2020   FREET4 1.47 06/07/2022   FREET4 1.71 03/01/2022   FREET4 2.18 (H) 12/01/2021   FREET4 1.85 (H) 05/26/2021   FREET4 1.76 02/20/2021   FREET4 1.60 11/24/2020   FREET4 1.79 (H) 08/05/2020   FREET4 1.81 (H) 05/21/2020        Thyroid uptake and scan from September 09, 2020  FINDINGS: Very low uptake within thyroid gland.  4 hour I-123 uptake = 1.3% (normal 5-20%),  24 hour I-123 uptake = 1.3% (normal 10-30%)   IMPRESSION: Very low uptake within the thyroid gland. Recommend clinical correlation for subacute thyroiditis.   Assessment & Plan:   1. Subacute thyroiditis -I reviewed his recent thyroid function tests which are consistent with treatment effect.  He will continue to benefit from low-dose methimazole.  I advised him to continue methimazole 5 mg p.o. daily at breakfast.    Based on the findings of thyroid uptake and scan, he will not need ablative therapy for now.     -I discussed the fact that amiodarone can still be utilized as his cardiologist sees fit and will manage the thyroid dysfunction on the side as necessary. Smoking puts this patient at higher risk of health complications including COPD.  The patient was counseled on the dangers of tobacco use, and was advised to quit.  Reviewed strategies to maximize success, including removing cigarettes and smoking materials from environment. He is not ready to quit.   He  will return with repeat thyroid function test in  3  months. - he is advised to maintain close follow up with Kathyrn Drown, MD for primary care needs.    Follow up plan: Return in about 6 months (around 12/11/2022) for F/U with Pre-visit Labs.  I spent 21 minutes in the care of the patient today including review of labs from Thyroid Function, CMP, and other relevant labs ; imaging/biopsy records (current and previous including abstractions from other facilities); face-to-face time discussing  his lab results and symptoms, medications doses, his options of short and long term treatment based on the latest standards of care / guidelines;   and documenting the encounter.  Gerald Hall  participated in the discussions, expressed understanding, and voiced agreement with the above plans.  All questions were answered to his satisfaction. he is  encouraged to contact clinic should he have any questions or concerns prior to his return visit.   Glade Lloyd, MD [facility] [address] [place], [place] [address] Phone: [phone]  Fax: [phone]     06/10/2022, 6:43 PM  This note was partially dictated with voice recognition software. Similar sounding words can be transcribed inadequately or may not  be corrected upon review.

## 2022-06-10 NOTE — Patient Instructions (Signed)
New dose '20mg'$  toresemide We will see you at the end of August

## 2022-06-11 ENCOUNTER — Other Ambulatory Visit: Payer: Self-pay | Admitting: *Deleted

## 2022-06-11 DIAGNOSIS — Z79899 Other long term (current) drug therapy: Secondary | ICD-10-CM

## 2022-06-11 NOTE — Progress Notes (Signed)
Blood work ordered in [facility]- patient aware

## 2022-06-16 DIAGNOSIS — L570 Actinic keratosis: Secondary | ICD-10-CM | POA: Diagnosis not present

## 2022-06-16 DIAGNOSIS — Z872 Personal history of diseases of the skin and subcutaneous tissue: Secondary | ICD-10-CM | POA: Diagnosis not present

## 2022-06-16 DIAGNOSIS — Z85828 Personal history of other malignant neoplasm of skin: Secondary | ICD-10-CM | POA: Diagnosis not present

## 2022-06-16 DIAGNOSIS — L578 Other skin changes due to chronic exposure to nonionizing radiation: Secondary | ICD-10-CM | POA: Diagnosis not present

## 2022-06-16 DIAGNOSIS — D485 Neoplasm of uncertain behavior of skin: Secondary | ICD-10-CM | POA: Diagnosis not present

## 2022-06-16 DIAGNOSIS — Z859 Personal history of malignant neoplasm, unspecified: Secondary | ICD-10-CM | POA: Diagnosis not present

## 2022-06-16 DIAGNOSIS — C44219 Basal cell carcinoma of skin of left ear and external auricular canal: Secondary | ICD-10-CM | POA: Diagnosis not present

## 2022-06-22 ENCOUNTER — Telehealth: Payer: Self-pay | Admitting: "Endocrinology

## 2022-06-22 ENCOUNTER — Encounter: Payer: Self-pay | Admitting: Family Medicine

## 2022-06-22 ENCOUNTER — Ambulatory Visit (INDEPENDENT_AMBULATORY_CARE_PROVIDER_SITE_OTHER): Payer: Medicare Other | Admitting: Family Medicine

## 2022-06-22 VITALS — BP 134/69 | Wt 205.0 lb

## 2022-06-22 DIAGNOSIS — R9389 Abnormal findings on diagnostic imaging of other specified body structures: Secondary | ICD-10-CM | POA: Diagnosis not present

## 2022-06-22 DIAGNOSIS — E0590 Thyrotoxicosis, unspecified without thyrotoxic crisis or storm: Secondary | ICD-10-CM

## 2022-06-22 DIAGNOSIS — R0609 Other forms of dyspnea: Secondary | ICD-10-CM | POA: Diagnosis not present

## 2022-06-22 DIAGNOSIS — M7989 Other specified soft tissue disorders: Secondary | ICD-10-CM

## 2022-06-22 DIAGNOSIS — R931 Abnormal findings on diagnostic imaging of heart and coronary circulation: Secondary | ICD-10-CM

## 2022-06-22 DIAGNOSIS — R9430 Abnormal result of cardiovascular function study, unspecified: Secondary | ICD-10-CM | POA: Diagnosis not present

## 2022-06-22 DIAGNOSIS — R0902 Hypoxemia: Secondary | ICD-10-CM | POA: Diagnosis not present

## 2022-06-22 DIAGNOSIS — J449 Chronic obstructive pulmonary disease, unspecified: Secondary | ICD-10-CM

## 2022-06-22 NOTE — Progress Notes (Signed)
   Subjective:    Patient ID: Gerald Hall, male    DOB: [birth year], 87 y.o.   MRN: [id number]  HPI Pt arrives for follow up. Pt states he is still short of breath and weak. Has not worsened. Has been exercising and taking nebulizer treatments. Fluid pill has working per patient.   Patient relates a lot of fatigue tiredness getting out of breath feeling winded at times has underlying COPD with hypoxia is on O2 Review of Systems     Objective:   Physical Exam No respiratory distress today heart regular pulse normal he does have some edema in the right leg This has been chronic more likely venous insufficiency but cannot rule out DVT       Assessment & Plan:  Underlying COPD Persistent opacity in the left lower lung Persistent shortness of breath with cough Recommend chest CT scan await the results  D-dimer ordered if comes back positive will need stat ultrasound of the leg otherwise we will set up CT scan and ultrasound to be done somewhere in the next 2 weeks  Reduced ejection fraction with the heart along with fatigue tiredness and getting out of breath easily-I believe the patient is having mild level of heart failure with reduced ejection fraction may well benefit from being on Entresto but we will see what the CAT scan shows first and then move forward

## 2022-06-22 NOTE — Telephone Encounter (Signed)
F/u   Patient is asking for a 90 days supply

## 2022-06-22 NOTE — Telephone Encounter (Signed)
New message     1. Which medications need to be refilled? (please list name of each medication and dose if known) methimazole (TAPAZOLE) 5 MG tablet  2. Which pharmacy/location (including street and city if local pharmacy) is medication to be sent to?[facility]   3. Do they need a 30 day or 90 day supply? 30 days supply

## 2022-06-23 ENCOUNTER — Encounter (HOSPITAL_COMMUNITY): Payer: Self-pay | Admitting: Radiology

## 2022-06-23 ENCOUNTER — Encounter: Payer: Self-pay | Admitting: Family Medicine

## 2022-06-23 ENCOUNTER — Ambulatory Visit (HOSPITAL_COMMUNITY)
Admission: RE | Admit: 2022-06-23 | Discharge: 2022-06-23 | Disposition: A | Discharge status: Home / Self Care | Payer: Medicare Other | Source: Ambulatory Visit | Attending: Family Medicine | Admitting: Family Medicine

## 2022-06-23 DIAGNOSIS — R0609 Other forms of dyspnea: Secondary | ICD-10-CM | POA: Diagnosis not present

## 2022-06-23 DIAGNOSIS — M7989 Other specified soft tissue disorders: Secondary | ICD-10-CM | POA: Insufficient documentation

## 2022-06-23 DIAGNOSIS — R9389 Abnormal findings on diagnostic imaging of other specified body structures: Secondary | ICD-10-CM | POA: Diagnosis not present

## 2022-06-23 DIAGNOSIS — R509 Fever, unspecified: Secondary | ICD-10-CM | POA: Diagnosis not present

## 2022-06-23 DIAGNOSIS — R0602 Shortness of breath: Secondary | ICD-10-CM | POA: Diagnosis not present

## 2022-06-23 DIAGNOSIS — R911 Solitary pulmonary nodule: Secondary | ICD-10-CM | POA: Diagnosis not present

## 2022-06-23 DIAGNOSIS — I700 Atherosclerosis of aorta: Secondary | ICD-10-CM

## 2022-06-23 DIAGNOSIS — I5022 Chronic systolic (congestive) heart failure: Secondary | ICD-10-CM

## 2022-06-23 HISTORY — DX: Atherosclerosis of aorta: I70.0

## 2022-06-23 LAB — BASIC METABOLIC PANEL (7)
BUN/Creatinine Ratio: 18 (ref 10–24)
BUN: 28 mg/dL — ABNORMAL HIGH (ref 8–27)
CO2: 29 mmol/L (ref 20–29)
Chloride: 97 mmol/L (ref 96–106)
Creatinine, Ser: 1.56 mg/dL — ABNORMAL HIGH (ref 0.76–1.27)
Glucose: 77 mg/dL (ref 70–99)
Potassium: 3.9 mmol/L (ref 3.5–5.2)
Sodium: 140 mmol/L (ref 134–144)
eGFR: 44 mL/min/{1.73_m2} — ABNORMAL LOW (ref >59)

## 2022-06-23 LAB — D-DIMER, QUANTITATIVE: D-DIMER: 1.93 mg/L FEU — ABNORMAL HIGH (ref 0.00–0.49)

## 2022-06-23 MED ORDER — IOHEXOL 350 MG/ML SOLN
60.0000 mL | Freq: Once | INTRAVENOUS | Status: AC | PRN
  Administered 2022-06-23: 60 mL via INTRAVENOUS

## 2022-06-23 MED ORDER — METHIMAZOLE 5 MG PO TABS: 5.0000 mg | ORAL_TABLET | Freq: Every day | ORAL | 1 refills | Status: DC

## 2022-06-23 NOTE — Addendum Note (Signed)
Addended by: Vicente Males on: 06/23/2022 11:04 AM   Modules accepted: Orders

## 2022-06-23 NOTE — Telephone Encounter (Signed)
Rx sent 

## 2022-06-24 ENCOUNTER — Telehealth: Payer: Self-pay

## 2022-06-24 DIAGNOSIS — J449 Chronic obstructive pulmonary disease, unspecified: Secondary | ICD-10-CM | POA: Diagnosis not present

## 2022-06-24 NOTE — Telephone Encounter (Signed)
Patient calls saying cardiology office with Dr Domenic Polite first available would be in march 2024, would like to know if needs to be referred elsewhere, please advise

## 2022-06-24 NOTE — Telephone Encounter (Signed)
Please let the patient know we will see what we can do to try to help with the situation

## 2022-06-24 NOTE — Addendum Note (Signed)
Addended by: Dairl Ponder on: 06/24/2022 09:44 AM   Modules accepted: Orders

## 2022-06-25 NOTE — Telephone Encounter (Signed)
Pt contacted and verbalized understanding. Pt states he is going out of town for Labor Day. Pt states is he can not get a sooner appt, he would like a new cardiologist.

## 2022-06-30 NOTE — Telephone Encounter (Signed)
Please touch base with Dr. Myles Gip office.  Apparently the patient has an appointment for a standard follow-up next year but he is now suffering with reduced ejection fraction with mild congestive heart failure and DOE.  We have done is much as we can do here in the office but he needs a follow-up appointment with cardiology preferably in September or early October at the latest.  His echo which was done earlier this year was 84 to 45% but his clinical condition is getting worse so that is why he needs to be seen earlier thank you-Dr. Nicki Reaper  Please let me know how far out they could get his appointment set up thanks

## 2022-07-01 NOTE — Telephone Encounter (Signed)
Contacted cardiology office and patient scheduled 08/17/22 at 1pm in [place] office with Dr Domenic Polite

## 2022-07-01 NOTE — Telephone Encounter (Signed)
Patient notified

## 2022-07-14 ENCOUNTER — Other Ambulatory Visit: Payer: Self-pay | Admitting: *Deleted

## 2022-07-14 ENCOUNTER — Other Ambulatory Visit: Payer: Self-pay

## 2022-07-14 MED ORDER — AMIODARONE HCL 200 MG PO TABS: 200.0000 mg | ORAL_TABLET | Freq: Every day | ORAL | 1 refills | Status: DC

## 2022-07-14 MED ORDER — ROPINIROLE HCL 2 MG PO TABS: 2.0000 mg | ORAL_TABLET | Freq: Every day | ORAL | 0 refills | Status: DC

## 2022-07-15 DIAGNOSIS — L578 Other skin changes due to chronic exposure to nonionizing radiation: Secondary | ICD-10-CM | POA: Diagnosis not present

## 2022-07-15 DIAGNOSIS — Z85828 Personal history of other malignant neoplasm of skin: Secondary | ICD-10-CM | POA: Diagnosis not present

## 2022-07-15 DIAGNOSIS — L814 Other melanin hyperpigmentation: Secondary | ICD-10-CM | POA: Diagnosis not present

## 2022-07-15 DIAGNOSIS — C44219 Basal cell carcinoma of skin of left ear and external auricular canal: Secondary | ICD-10-CM | POA: Diagnosis not present

## 2022-07-24 DIAGNOSIS — J449 Chronic obstructive pulmonary disease, unspecified: Secondary | ICD-10-CM | POA: Diagnosis not present

## 2022-07-30 ENCOUNTER — Other Ambulatory Visit: Payer: Self-pay | Admitting: Family Medicine

## 2022-07-30 MED ORDER — TRAMADOL HCL 50 MG PO TABS: 50.0000 mg | ORAL_TABLET | Freq: Two times a day (BID) | ORAL | 3 refills | Status: DC | PRN

## 2022-08-03 ENCOUNTER — Ambulatory Visit (INDEPENDENT_AMBULATORY_CARE_PROVIDER_SITE_OTHER): Payer: Medicare Other | Admitting: Family Medicine

## 2022-08-03 ENCOUNTER — Encounter: Payer: Self-pay | Admitting: Family Medicine

## 2022-08-03 VITALS — BP 134/88 | Wt 207.0 lb

## 2022-08-03 DIAGNOSIS — Z23 Encounter for immunization: Secondary | ICD-10-CM | POA: Diagnosis not present

## 2022-08-03 DIAGNOSIS — Z79899 Other long term (current) drug therapy: Secondary | ICD-10-CM

## 2022-08-03 DIAGNOSIS — N1831 Chronic kidney disease, stage 3a: Secondary | ICD-10-CM | POA: Diagnosis not present

## 2022-08-03 DIAGNOSIS — G4700 Insomnia, unspecified: Secondary | ICD-10-CM | POA: Diagnosis not present

## 2022-08-03 DIAGNOSIS — R0609 Other forms of dyspnea: Secondary | ICD-10-CM | POA: Diagnosis not present

## 2022-08-03 DIAGNOSIS — I5022 Chronic systolic (congestive) heart failure: Secondary | ICD-10-CM

## 2022-08-03 DIAGNOSIS — J432 Centrilobular emphysema: Secondary | ICD-10-CM | POA: Diagnosis not present

## 2022-08-03 MED ORDER — CLONAZEPAM 0.5 MG PO TABS: ORAL_TABLET | ORAL | 2 refills | Status: DC

## 2022-08-03 MED ORDER — TORSEMIDE 20 MG PO TABS: ORAL_TABLET | ORAL | 5 refills | Status: DC

## 2022-08-03 MED ORDER — CARVEDILOL 6.25 MG PO TABS: ORAL_TABLET | ORAL | 5 refills | Status: DC

## 2022-08-03 NOTE — Patient Instructions (Addendum)
Hi Gerald Hall  This of breath could be related to the lungs but could also be related to the reduced heart function.  As for torsemide-Demadex-I would recommend taking 1 each morning but on Mondays and Fridays 2 tablets  As for the Coreg we will increase the dose   Do your blood work next week  We will send notification to Dr. Domenic Polite regarding these recent changes  You may try the clonazepam at nighttime to see if it helps your sleep if it causes any side effects or problems please stop the medicine please be careful if you have to get up in the middle the night.  We will see you back in 8 weeks sooner if any problems  TakeCare-Dr. Nicki Reaper

## 2022-08-03 NOTE — Progress Notes (Addendum)
   Subjective:    Patient ID: Gerald Hall, male    DOB: 1935-07-23, 87 y.o.   MRN: [id number]  HPI Pt arrives for follow up. Pt states his shortness of breath has worsened. Pt still having some bilateral leg swelling. Pt still having some fatigue but not as bad. Pt states he is still not sleeping.  Reduced ejection fraction concurrent with and due to chronic heart failure ([place]) - Plan: Microalbumin/Creatinine Ratio, Urine, Basic metabolic panel, Brain natriuretic peptide  Centrilobular emphysema (HCC)  DOE (dyspnea on exertion)  Stage 3a chronic kidney disease (HCC) - Plan: Microalbumin/Creatinine Ratio, Urine, Basic metabolic panel, Brain natriuretic peptide  Need for vaccination - Plan: Flu Vaccine QUAD High Dose(Fluad)  High risk medication use - Plan: Microalbumin/Creatinine Ratio, Urine, Basic metabolic panel, Brain natriuretic peptide  Insomnia, unspecified type   Review of Systems     Objective:   Physical Exam  General-in no acute distress Eyes-no discharge Lungs-respiratory rate normal, CTA CV-no murmurs,RRR Extremities skin warm dry  Neuro grossly normal Behavior normal, alert  He does have some edema in the legs not severe I do not hear any crackles in the lungs     Assessment & Plan:  1. Reduced ejection fraction concurrent with and due to chronic heart failure ([place]) At this point we will bump up the Coreg.  Patient will be seeing Dr. Domenic Polite in the near future.  Not sure if he is a good candidate for [employer].  Also asked for Demadex we will do 1 each morning and on Mondays and Fridays due 2  In addition to this do lab work next week - Microalbumin/Creatinine Ratio, Urine - Basic metabolic panel - Brain natriuretic peptide  2. Centrilobular emphysema ([place]) Certainly this could be contributing to his shortness of breath but I do not feel that this is the main issue causing him DOE I think is more likely is heart but certainly multifactorial  It should  be noted that this patient does have hypoxia.  O2 saturation on room air 87%. Patient benefits from continuous nasal cannula oxygen. Without oxygen this would be very difficult for him from a medical perspective 3. DOE (dyspnea on exertion) Multifactorial no frank CHF currently  4. Stage 3a chronic kidney disease (HCC) Stage IIIa with Demadex we need to monitor kidney function hold off on ACE and ARB currently - Microalbumin/Creatinine Ratio, Urine - Basic metabolic panel - Brain natriuretic peptide  5. Need for vaccination Flu shot - Flu Vaccine QUAD High Dose(Fluad2 In addition to this check lab work next week 6. High risk medication use Lab work - Microalbumin/Creatinine Ratio, Urine - Basic metabolic panel - Brain natriuretic peptide We will plan to see him back in 8 weeks to recheck how he is doing  Insomnia issue there is no good solution for this we discussed various approaches including just watching He feels that this is a significant impediment on his quality of life he would like to try something he is not a good candidate for Ambien not a good candidate for Seroquel we will try low-dose clonazepam risk and benefits were discussed he will try using a bedside urinal to help with urination at night

## 2022-08-04 LAB — BASIC METABOLIC PANEL
BUN/Creatinine Ratio: 15 (ref 10–24)
BUN: 24 mg/dL (ref 8–27)
CO2: 27 mmol/L (ref 20–29)
Calcium: 9.4 mg/dL (ref 8.6–10.2)
Chloride: 101 mmol/L (ref 96–106)
Creatinine, Ser: 1.58 mg/dL — ABNORMAL HIGH (ref 0.76–1.27)
Glucose: 93 mg/dL (ref 70–99)
Potassium: 3.9 mmol/L (ref 3.5–5.2)
Sodium: 143 mmol/L (ref 134–144)
eGFR: 43 mL/min/{1.73_m2} — ABNORMAL LOW (ref >59)

## 2022-08-04 LAB — MICROALBUMIN / CREATININE URINE RATIO
Creatinine, Urine: 39.6 mg/dL
Microalb/Creat Ratio: 79 mg/g creat — ABNORMAL HIGH (ref 0–29)
Microalbumin, Urine: 31.4 ug/mL

## 2022-08-04 LAB — BRAIN NATRIURETIC PEPTIDE: BNP: 208 pg/mL — ABNORMAL HIGH (ref 0.0–100.0)

## 2022-08-05 NOTE — Addendum Note (Signed)
Addended by: Vicente Males on: 08/05/2022 03:51 PM   Modules accepted: Orders

## 2022-08-17 ENCOUNTER — Encounter: Payer: Self-pay | Admitting: Cardiology

## 2022-08-17 ENCOUNTER — Ambulatory Visit: Payer: Medicare Other | Attending: Cardiology | Admitting: Cardiology

## 2022-08-17 VITALS — BP 128/82 | HR 56 | Ht 68.0 in | Wt 209.0 lb

## 2022-08-17 DIAGNOSIS — I471 Supraventricular tachycardia, unspecified: Secondary | ICD-10-CM | POA: Diagnosis not present

## 2022-08-17 DIAGNOSIS — I5022 Chronic systolic (congestive) heart failure: Secondary | ICD-10-CM

## 2022-08-17 DIAGNOSIS — I10 Essential (primary) hypertension: Secondary | ICD-10-CM | POA: Diagnosis not present

## 2022-08-17 MED ORDER — CARVEDILOL 3.125 MG PO TABS: 6.2500 mg | ORAL_TABLET | Freq: Two times a day (BID) | ORAL | 1 refills | Status: DC

## 2022-08-17 MED ORDER — TORSEMIDE 20 MG PO TABS: 20.0000 mg | ORAL_TABLET | Freq: Every day | ORAL | 1 refills | Status: DC

## 2022-08-17 MED ORDER — AMIODARONE HCL 100 MG PO TABS: 100.0000 mg | ORAL_TABLET | Freq: Every day | ORAL | 1 refills | Status: DC

## 2022-08-17 NOTE — Patient Instructions (Addendum)
Medication Instructions:  Your physician has recommended you make the following change in your medication:  Decrease coreg to 3.125 mg twice a day Decrease amiodarone to 100 mg once a day Decrease torsemide 20 mg to once a day, may take additional tablet as needed for swelling Continue all other medications as directed   Labwork: BMET (2-3 weeks @ Dr Wolfgang Phoenix)  Testing/Procedures: none  Follow-Up:  Your physician recommends that you schedule a follow-up appointment in: 6 weeks  Any Other Special Instructions Will Be Listed Below (If Applicable).  If you need a refill on your cardiac medications before your next appointment, please call your pharmacy.

## 2022-08-17 NOTE — Progress Notes (Signed)
Cardiology Office Note  Date: 08/17/2022   ID: Gerald Hall Nov 17, 1934, MRN [id number]  PCP:  Gerald Drown, MD  Cardiologist:  Gerald Lesches, MD Electrophysiologist:  None   Chief Complaint  Patient presents with   Cardiac follow-up    History of Present Illness: Gerald Hall is an 87 y.o. male last seen in October 2022.  He is referred back to the office by Dr. Wolfgang Hall, recent visit note reviewed.  States that he has had more leg swelling in the last 6 weeks, switch from HCTZ to torsemide with potassium supplement.  Echocardiogram from March revealed LVEF 40 to 45% with global hypokinesis, mild diastolic dysfunction, moderately dilated left atrium, mild to moderate mitral regurgitation, trivial aortic regurgitation with aortic root 38 mm.  Prior LVEF was in the 45 to 50% range as of 2020.  We went over his medications today and discussed gradual adjustments in terms of GDMT with cardiomyopathy.  No invasive cardiac testing is anticipated however.  States that his scales are broken at home.  Generally leg swelling has been better on Demadex.  Recent lab work showed creatinine 1.58 and potassium 3.9.  Past Medical History:  Diagnosis Date   Aortic atherosclerosis ([place]) 06/23/2022   Seen on CAT scan   Arthritis    Balance problem    Uses a walker   Blood in urine    Sees urology yearly   Deafness in right ear age 88   Hyperlipidemia    Hypertension    PSVT (paroxysmal supraventricular tachycardia)    Reduced ejection fraction concurrent with and due to chronic heart failure ([place]) 06/23/2022   See echo spring 2023 patient with DOE and some pedal edema has chronic kidney disease stage IIIb   Temporal arteritis ([place]) 2016   Urinary incontinence     Past Surgical History:  Procedure Laterality Date   BACK SURGERY  2010   [place]     CATARACT EXTRACTION Bilateral 07/30/14, 08/13/2014   CERVICAL SPINE SURGERY  09/01/15   C 4-5 , [place]   COLONOSCOPY  12/19/2007   RMR: 1. External hemorrohids, otherwise normal rectum.2. Normal colon. 3. Normal terminal ileum.   COLONOSCOPY N/A 12/13/2014   Procedure: COLONOSCOPY;  Surgeon: Daneil Dolin, MD;  Location: [facility];  Service: Endoscopy;  Laterality: N/A;  11:15 Pt Request Time   KNEE SURGERY Left    around 2000, arthroscopic   TOTAL HIP ARTHROPLASTY Right 02/23/2017   Procedure: TOTAL HIP ARTHROPLASTY ANTERIOR APPROACH;  Surgeon: Hessie Knows, MD;  Location: [facility] ORS;  Service: Orthopedics;  Laterality: Right;    Current Outpatient Medications  Medication Sig Dispense Refill   acetaminophen (TYLENOL) 325 MG tablet Take 1,000 mg by mouth every 6 (six) hours as needed for mild pain or headache.     albuterol (VENTOLIN HFA) 108 (90 Base) MCG/ACT inhaler Inhale 2 puffs into the lungs every 4 (four) hours as needed for wheezing. 18 g 5   aspirin EC 325 MG EC tablet Take 1 tablet (325 mg total) by mouth daily. 30 tablet 0   calcium carbonate (OS-CAL) 600 MG TABS tablet Take 600 mg by mouth daily with breakfast.     cholecalciferol (VITAMIN D) 400 units TABS tablet Take 1,000 Units by mouth daily.     clonazePAM (KLONOPIN) 0.5 MG tablet 1 nightly as needed.  Insomnia caution drowsiness 30 tablet 2   Dextromethorphan-guaiFENesin (MUCINEX DM) 30-600 MG TB12 Take 1 tablet by  mouth daily.      finasteride (PROSCAR) 5 MG tablet TAKE 1 TABLET BY MOUTH ONCE DAILY. 90 tablet 0   fluticasone (FLONASE) 50 MCG/ACT nasal spray Place 2 sprays into both nostrils at bedtime.     ipratropium-albuterol (DUONEB) 0.5-2.5 (3) MG/3ML SOLN Take 3 mLs by nebulization every 4 (four) hours as needed. 360 mL 6   methimazole (TAPAZOLE) 5 MG tablet Take 1 tablet (5 mg total) by mouth daily. 90 tablet 1   mirabegron ER (MYRBETRIQ) 50 MG TB24 tablet Take 50 mg by mouth daily.     OXYGEN Inhale 2 L into the lungs continuous.     oxymetazoline (AFRIN) 0.05 % nasal spray Place 1 spray into both nostrils  at bedtime.     polyethylene glycol (MIRALAX / GLYCOLAX) 17 g packet Take 17 g by mouth daily as needed.     polyvinyl alcohol (LIQUIFILM TEARS) 1.4 % ophthalmic solution Place 1 drop into both eyes as needed for dry eyes.     potassium chloride (KLOR-CON) 10 MEQ tablet TAKE 1 TABLET BY MOUTH TWICE DAILY 180 tablet 0   rOPINIRole (REQUIP) 2 MG tablet Take 1 tablet (2 mg total) by mouth at bedtime. 180 tablet 0   sodium chloride (OCEAN) 0.65 % SOLN nasal spray Place 1 spray into both nostrils as needed for congestion.  0   SYMBICORT 160-4.5 MCG/ACT inhaler INHALE 2 PUFFS BY MOUTH TWICE DAILY 10.2 g 0   tamsulosin (FLOMAX) 0.4 MG CAPS capsule TAKE (1) CAPSULE BY MOUTH TWICE DAILY. 180 capsule 1   traMADol (ULTRAM) 50 MG tablet Take 1 tablet (50 mg total) by mouth every 12 (twelve) hours as needed. 60 tablet 3   vitamin C (ASCORBIC ACID) 500 MG tablet Take 500 mg by mouth daily.     Zinc Acetate, Oral, (ZINC ACETATE PO) Take 1 tablet by mouth daily.     amiodarone (PACERONE) 100 MG tablet Take 1 tablet (100 mg total) by mouth daily. 90 tablet 1   carvedilol (COREG) 3.125 MG tablet Take 2 tablets (6.25 mg total) by mouth 2 (two) times daily with a meal. 1 taken twice daily 180 tablet 1   torsemide (DEMADEX) 20 MG tablet Take 1 tablet (20 mg total) by mouth daily. 90 tablet 1   No current facility-administered medications for this visit.   Allergies:  Cephalexin and Penicillins   ROS: No orthopnea or PND.  Physical Exam: VS:  BP 128/82   Pulse (!) 56   Ht '5\' 8"'$  (1.727 m)   Wt 209 lb (94.8 kg)   SpO2 92%   BMI 31.78 kg/m , BMI Body mass index is 31.78 kg/m.  Wt Readings from Last 3 Encounters:  08/17/22 209 lb (94.8 kg)  08/03/22 207 lb (93.9 kg)  06/22/22 205 lb (93 kg)    General: Elderly male using a walker.   HEENT: Conjunctiva and lids normal. Neck: Supple, no elevated JVP or carotid bruits, no thyromegaly. Lungs: Scattered rhonchi, decreased breath sounds at the  bases. Cardiac: Regular rate and rhythm, no S3 or significant systolic murmur. Abdomen: Protuberant, bowel sounds present. Extremities: Mild ankle edema.  ECG:  An ECG dated 11/20/2021 was personally reviewed today and demonstrated:  Sinus rhythm with prolonged PR interval.  Recent Labwork: 11/21/2021: ALT 23; AST 18; Magnesium 2.1 06/01/2022: Hemoglobin 13.6; Platelets 191 06/07/2022: TSH 0.376 08/03/2022: BNP 208.0; BUN 24; Creatinine, Ser 1.58; Potassium 3.9; Sodium 143     Component Value Date/Time   CHOL 183 08/05/2020 0932  TRIG 113 08/05/2020 0932   HDL 40 08/05/2020 0932   CHOLHDL 4.6 08/05/2020 0932   CHOLHDL 3.7 09/06/2014 1259   VLDL 29 09/06/2014 1259   LDLCALC 122 (H) 08/05/2020 0932    Other Studies Reviewed Today:  Echocardiogram 01/05/2022:  1. Left ventricular ejection fraction, by estimation, is 40 to 45%. The  left ventricle has mildly decreased function. The left ventricle  demonstrates global hypokinesis. The left ventricular internal cavity size  was mildly dilated. There is mild  concentric left ventricular hypertrophy. Left ventricular diastolic  parameters are consistent with Grade I diastolic dysfunction (impaired  relaxation).   2. Right ventricular systolic function is normal. The right ventricular  size is normal. Tricuspid regurgitation signal is inadequate for assessing  PA pressure.   3. Left atrial size was moderately dilated.   4. The mitral valve is abnormal. Mild to moderate mitral valve  regurgitation. Moderate mitral annular calcification.   5. The aortic valve is tricuspid. Aortic valve regurgitation is trivial.   6. Aortic dilatation noted. There is borderline dilatation of the aortic  root, measuring 38 mm.   7. The inferior vena cava is normal in size with greater than 50%  respiratory variability, suggesting right atrial pressure of 3 mmHg.   Assessment and Plan:  1.  HFmrEF, LVEF 40 to 45% by echocardiogram in March.  Also mild  diastolic dysfunction, normal RV contraction.  Possible nonischemic etiology given global hypokinesis, although further invasive cardiac testing is not planned at this time.  We will adjust medications as able in terms of GDMT.  Reduce Coreg back to 3.125 mg twice daily.  Start SGLT2 inhibitor and keep Demadex with potassium supplement daily use with only occasional doubling depending on weight gain or fluid reaccumulation.  We will follow-up BMET at next office visit and may be able to add further therapy from there depending on blood pressure and clinical status.  2.  PSVT, well controlled on amiodarone which will be reduced to 200 mg daily for now.  Recent lab work showed normal TSH and LFTs.  3.  Essential hypertension, continue to follow.  Medication Adjustments/Labs and Tests Ordered: Current medicines are reviewed at length with the patient today.  Concerns regarding medicines are outlined above.   Tests Ordered: Orders Placed This Encounter  Procedures   Basic metabolic panel    Medication Changes: Meds ordered this encounter  Medications   amiodarone (PACERONE) 100 MG tablet    Sig: Take 1 tablet (100 mg total) by mouth daily.    Dispense:  90 tablet    Refill:  1    08/17/22 Dose decrease   carvedilol (COREG) 3.125 MG tablet    Sig: Take 2 tablets (6.25 mg total) by mouth 2 (two) times daily with a meal. 1 taken twice daily    Dispense:  180 tablet    Refill:  1    08/17/22 Dose decrease   torsemide (DEMADEX) 20 MG tablet    Sig: Take 1 tablet (20 mg total) by mouth daily.    Dispense:  90 tablet    Refill:  1    08/17/22 New directions    Disposition:  Follow up  6 weeks in the [place] office.  Signed, Satira Sark, MD, [employer] 08/17/2022 1:48 PM    [facility] [place], [place], [place] [address] Phone: [phone]; Fax: [phone]

## 2022-08-18 ENCOUNTER — Telehealth: Payer: Self-pay | Admitting: Pharmacist

## 2022-08-18 MED ORDER — EMPAGLIFLOZIN 10 MG PO TABS: 10.0000 mg | ORAL_TABLET | Freq: Every day | ORAL | 1 refills | Status: DC

## 2022-08-18 NOTE — Telephone Encounter (Signed)
PA for Jardiance submitted. Key: [id number].   This medication or product is on your plan's list of covered drugs. Prior authorization is not required at this time

## 2022-08-18 NOTE — Addendum Note (Signed)
Addended by: Sung Amabile on: 08/18/2022 01:30 PM   Modules accepted: Orders

## 2022-08-18 NOTE — Telephone Encounter (Addendum)
Patient made aware that jardiance 10 mg will be sent to [facility].

## 2022-08-18 NOTE — Telephone Encounter (Signed)
-----   Message from Sung Amabile, [place] sent at 08/18/2022  7:19 AM EDT ----- Good morning,  Could someone tell me if this patients insurance would cover Farxiga 10 mg or Jardiance 10 mg?  Thank you,  Nicholes Stairs, CMA

## 2022-08-24 ENCOUNTER — Telehealth: Payer: Self-pay | Admitting: *Deleted

## 2022-08-24 DIAGNOSIS — R0609 Other forms of dyspnea: Secondary | ICD-10-CM | POA: Diagnosis not present

## 2022-08-24 DIAGNOSIS — J432 Centrilobular emphysema: Secondary | ICD-10-CM | POA: Diagnosis not present

## 2022-08-24 DIAGNOSIS — J449 Chronic obstructive pulmonary disease, unspecified: Secondary | ICD-10-CM | POA: Diagnosis not present

## 2022-08-24 NOTE — Telephone Encounter (Signed)
Patient called and stated he is having ongoing issues with SOB and wheezing and breathing issues. Patient states he is also having weakness and wondered if he was dehydrated so he has been drinking a lot of water today. Patient also states Dr Domenic Polite put him on Jardiance and he wonders the off label uses for this med and why he was put on it- he also read it should not be taken with kidney disease and he has kidney disease.    [phone]

## 2022-08-25 NOTE — Telephone Encounter (Signed)
Pt contacted and states Dr.Scott called him that one time. Pt set up for 11 on Friday morning.

## 2022-08-25 NOTE — Telephone Encounter (Signed)
Please reach out to the patient I tried calling him twice once yes yesterday evening once this morning did not get an answer and left a message I will be more than happy to see him Friday [place].  We could see him and do blood work and review over his medicines.  He can bring his medicines with him.  Also if he is having severe or worrisome issues to immediately go to ER

## 2022-08-27 ENCOUNTER — Other Ambulatory Visit: Payer: Self-pay | Admitting: *Deleted

## 2022-08-27 ENCOUNTER — Ambulatory Visit (INDEPENDENT_AMBULATORY_CARE_PROVIDER_SITE_OTHER): Payer: Medicare Other | Admitting: Family Medicine

## 2022-08-27 ENCOUNTER — Other Ambulatory Visit (HOSPITAL_COMMUNITY)
Admission: RE | Admit: 2022-08-27 | Discharge: 2022-08-27 | Disposition: A | Discharge status: Home / Self Care | Payer: Medicare Other | Source: Ambulatory Visit | Attending: Family Medicine | Admitting: Family Medicine

## 2022-08-27 VITALS — BP 126/82 | HR 61 | Temp 98.0°F | Ht 68.0 in | Wt 208.0 lb

## 2022-08-27 DIAGNOSIS — I10 Essential (primary) hypertension: Secondary | ICD-10-CM | POA: Diagnosis not present

## 2022-08-27 DIAGNOSIS — I129 Hypertensive chronic kidney disease with stage 1 through stage 4 chronic kidney disease, or unspecified chronic kidney disease: Secondary | ICD-10-CM | POA: Diagnosis not present

## 2022-08-27 DIAGNOSIS — J441 Chronic obstructive pulmonary disease with (acute) exacerbation: Secondary | ICD-10-CM | POA: Diagnosis not present

## 2022-08-27 DIAGNOSIS — N1832 Chronic kidney disease, stage 3b: Secondary | ICD-10-CM | POA: Diagnosis not present

## 2022-08-27 DIAGNOSIS — I429 Cardiomyopathy, unspecified: Secondary | ICD-10-CM | POA: Insufficient documentation

## 2022-08-27 DIAGNOSIS — J432 Centrilobular emphysema: Secondary | ICD-10-CM | POA: Diagnosis not present

## 2022-08-27 DIAGNOSIS — I5022 Chronic systolic (congestive) heart failure: Secondary | ICD-10-CM

## 2022-08-27 LAB — BRAIN NATRIURETIC PEPTIDE: B Natriuretic Peptide: 396 pg/mL — ABNORMAL HIGH (ref 0.0–100.0)

## 2022-08-27 LAB — BASIC METABOLIC PANEL
Anion gap: 7 (ref 5–15)
BUN: 36 mg/dL — ABNORMAL HIGH (ref 8–23)
CO2: 31 mmol/L (ref 22–32)
Calcium: 9.2 mg/dL (ref 8.9–10.3)
Chloride: 101 mmol/L (ref 98–111)
Creatinine, Ser: 1.51 mg/dL — ABNORMAL HIGH (ref 0.61–1.24)
GFR, Estimated: 45 mL/min — ABNORMAL LOW (ref >60)
Glucose, Bld: 93 mg/dL (ref 70–99)
Potassium: 4.1 mmol/L (ref 3.5–5.1)
Sodium: 139 mmol/L (ref 135–145)

## 2022-08-27 MED ORDER — FLUTICASONE PROPIONATE 50 MCG/ACT NA SUSP: 2.0000 | Freq: Every day | NASAL | 12 refills | Status: DC

## 2022-08-27 MED ORDER — DOXYCYCLINE HYCLATE 100 MG PO TABS: 100.0000 mg | ORAL_TABLET | Freq: Two times a day (BID) | ORAL | 0 refills | Status: DC

## 2022-08-27 MED ORDER — IPRATROPIUM-ALBUTEROL 0.5-2.5 (3) MG/3ML IN SOLN: 3.0000 mL | RESPIRATORY_TRACT | 12 refills | Status: DC | PRN

## 2022-08-27 MED ORDER — FLUTICASONE PROPIONATE 50 MCG/ACT NA SUSP: 2.0000 | Freq: Every day | NASAL | 0 refills | Status: DC

## 2022-08-27 MED ORDER — BUDESONIDE-FORMOTEROL FUMARATE 160-4.5 MCG/ACT IN AERO: 2.0000 | INHALATION_SPRAY | Freq: Two times a day (BID) | RESPIRATORY_TRACT | 0 refills | Status: DC

## 2022-08-27 MED ORDER — BUDESONIDE-FORMOTEROL FUMARATE 160-4.5 MCG/ACT IN AERO: 2.0000 | INHALATION_SPRAY | Freq: Two times a day (BID) | RESPIRATORY_TRACT | 12 refills | Status: DC

## 2022-08-27 MED ORDER — ROPINIROLE HCL 2 MG PO TABS: ORAL_TABLET | ORAL | 3 refills | Status: DC

## 2022-08-27 NOTE — Progress Notes (Signed)
   Subjective:    Patient ID: Gerald Hall, male    DOB: 1934-12-28, 87 y.o.   MRN: [id number]  HPI Follow up COPD and hypoxia Weakness and wheezing, all the time when off the oxygen Coughing congestion wheezing denies high fever chills  Review of Systems     Objective:   Physical Exam  Crackles more on the left base in the right base intermittent wheezing noted bilateral but no respiratory distress      Assessment & Plan:  Continue [name] keep O2 saturation between 92-95% Continue breathing treatments and Symbicort Refill sent in Check BNP because of heart failure issues and crackles Patient has not started Jardiance yet Await the results of the labs so that he could start this with follow-up labs 10 to 14 days later Short course of antibiotics because of COPD exacerbation

## 2022-09-01 ENCOUNTER — Encounter: Payer: Self-pay | Admitting: Family Medicine

## 2022-09-01 ENCOUNTER — Ambulatory Visit (INDEPENDENT_AMBULATORY_CARE_PROVIDER_SITE_OTHER): Payer: Medicare Other | Admitting: Family Medicine

## 2022-09-01 VITALS — BP 110/70 | Wt 206.6 lb

## 2022-09-01 DIAGNOSIS — I5022 Chronic systolic (congestive) heart failure: Secondary | ICD-10-CM | POA: Diagnosis not present

## 2022-09-01 DIAGNOSIS — I10 Essential (primary) hypertension: Secondary | ICD-10-CM

## 2022-09-01 DIAGNOSIS — J432 Centrilobular emphysema: Secondary | ICD-10-CM | POA: Diagnosis not present

## 2022-09-01 DIAGNOSIS — N1832 Chronic kidney disease, stage 3b: Secondary | ICD-10-CM

## 2022-09-01 DIAGNOSIS — M5432 Sciatica, left side: Secondary | ICD-10-CM

## 2022-09-01 MED ORDER — ALBUTEROL SULFATE HFA 108 (90 BASE) MCG/ACT IN AERS: 2.0000 | INHALATION_SPRAY | RESPIRATORY_TRACT | 5 refills | Status: DC | PRN

## 2022-09-01 NOTE — Progress Notes (Signed)
   Subjective:    Patient ID: Gerald Hall, male    DOB: 1935/01/17, 87 y.o.   MRN: [id number]  HPI Pt arrives with daughter [name] for follow up. Pt states things have not improved but not worsened either. Pt currently on 2L of oxygen.   Pt having off and on soreness in left leg.  HTN (hypertension), benign - Plan: Basic metabolic panel, Magnesium  Stage 3b chronic kidney disease ([place]) - Plan: Basic metabolic panel, Magnesium  Reduced ejection fraction concurrent with and due to chronic heart failure (HCC)  Centrilobular emphysema (HCC)  Sciatica, left side  Patient with underlying blood pressure issues but today's look very good He relates some weakness getting out of a chair He uses a cane to get around or walker to get around He also has underlying heart failure but has not flared up recently Follows with specialist   Review of Systems     Objective:   Physical Exam  General-in no acute distress Eyes-no discharge Lungs-respiratory rate normal, CTA CV-no murmurs,RRR Extremities skin warm dry no edema Neuro grossly normal Behavior normal, alert Blood pressure sitting and standing stable      Assessment & Plan:   1. HTN (hypertension), benign Blood pressure stable currently follow-up in a couple weeks we will check blood pressure sitting and standing may need to adjust torsemide if he starts becoming hypotensive - Basic metabolic panel - Magnesium  2. Stage 3b chronic kidney disease ([name]) Recheck kidney function in 7 to 10 days we are starting Jardiance with clinical indications of CKD as well as heart failure - Basic metabolic panel - Magnesium  3. Reduced ejection fraction concurrent with and due to chronic heart failure ([name]) After discussion of how the medication works patient is on board with starting Jardiance to help his heart and perhaps help his CKD as well  4. Centrilobular emphysema ([name]) Patient was encouraged to quit smoking but it is unlikely  he will do so he tries to keep the cigarettes down to 6 or less per day  5. Sciatica, left side Gentle stretching if progressive troubles physical therapy but for right now he will just do gentle stretching

## 2022-09-03 ENCOUNTER — Other Ambulatory Visit: Payer: Self-pay | Admitting: Family Medicine

## 2022-09-10 DIAGNOSIS — I10 Essential (primary) hypertension: Secondary | ICD-10-CM | POA: Diagnosis not present

## 2022-09-10 DIAGNOSIS — N1832 Chronic kidney disease, stage 3b: Secondary | ICD-10-CM | POA: Diagnosis not present

## 2022-09-11 LAB — BASIC METABOLIC PANEL
BUN/Creatinine Ratio: 18 (ref 10–24)
BUN: 27 mg/dL (ref 8–27)
CO2: 28 mmol/L (ref 20–29)
Calcium: 9.9 mg/dL (ref 8.6–10.2)
Chloride: 98 mmol/L (ref 96–106)
Creatinine, Ser: 1.53 mg/dL — ABNORMAL HIGH (ref 0.76–1.27)
Glucose: 92 mg/dL (ref 70–99)
Potassium: 3.6 mmol/L (ref 3.5–5.2)
Sodium: 142 mmol/L (ref 134–144)
eGFR: 44 mL/min/{1.73_m2} — ABNORMAL LOW (ref >59)

## 2022-09-11 LAB — MAGNESIUM: Magnesium: 2.3 mg/dL (ref 1.6–2.3)

## 2022-09-13 ENCOUNTER — Encounter: Payer: Self-pay | Admitting: Family Medicine

## 2022-09-13 ENCOUNTER — Ambulatory Visit (INDEPENDENT_AMBULATORY_CARE_PROVIDER_SITE_OTHER): Payer: Medicare Other | Admitting: Family Medicine

## 2022-09-13 VITALS — BP 118/62 | Wt 207.2 lb

## 2022-09-13 DIAGNOSIS — I5022 Chronic systolic (congestive) heart failure: Secondary | ICD-10-CM | POA: Diagnosis not present

## 2022-09-13 NOTE — Progress Notes (Signed)
   Subjective:    Patient ID: Gerald Hall, male    DOB: April 17, 1935, 87 y.o.   MRN: [id number]  HPI Pt arrives for follow up. Pt states he has not seen any improvement since taking medicine. Pt states breathing is fine; on 2 L oxygen.  Denies dizziness or feeling weak with standing blood pressure overall is good heart sounds good lungs sound good Pt arrives with [employer].  Tolerate the medicine well  He states his moods are doing well currently Review of Systems     Objective:   Physical Exam  General-in no acute distress Eyes-no discharge Lungs-respiratory rate normal, CTA CV-no murmurs,RRR Extremities skin warm dry no edema Neuro grossly normal Behavior normal, alert  No significant edema     Assessment & Plan:  Ejection fraction declined as well as tendency toward heart failure you are doing well on current medications.  Not having any side effects with the medicine.  Continue current measures  Patient feels better about following up in approximately 2 months time to be rechecked

## 2022-09-23 DIAGNOSIS — J449 Chronic obstructive pulmonary disease, unspecified: Secondary | ICD-10-CM | POA: Diagnosis not present

## 2022-09-23 DIAGNOSIS — R0609 Other forms of dyspnea: Secondary | ICD-10-CM | POA: Diagnosis not present

## 2022-09-23 DIAGNOSIS — R69 Illness, unspecified: Secondary | ICD-10-CM | POA: Diagnosis not present

## 2022-09-23 DIAGNOSIS — J432 Centrilobular emphysema: Secondary | ICD-10-CM | POA: Diagnosis not present

## 2022-09-28 ENCOUNTER — Ambulatory Visit (INDEPENDENT_AMBULATORY_CARE_PROVIDER_SITE_OTHER): Payer: Medicare Other | Admitting: Family Medicine

## 2022-09-28 VITALS — BP 129/73 | Ht 67.0 in | Wt 207.0 lb

## 2022-09-28 DIAGNOSIS — N1832 Chronic kidney disease, stage 3b: Secondary | ICD-10-CM

## 2022-09-28 NOTE — Progress Notes (Signed)
   Subjective:    Patient ID: Gerald Hall, male    DOB: March 30, 1935, 87 y.o.   MRN: [id number]  HPI Patient arrives with frequent falls this week- has fallen 4 times so far this week because his legs keep giving out on him Patient relates a lot of fatigue tiredness legs giving out he is fallen several times because his legs give out he thinks it is because his left knee is giving him trouble but he denies pain in the knee he has difficult time arising out of a chair he has recently been put on Jardiance blood work and looked good but we need to do follow-up blood pressures he denies any chest tightness pressure pain denies swelling in the legs.  Review of Systems     Objective:   Physical Exam On examination hearts regular extremities trace edema skin warm dry lungs are clear patient does exhibit muscle wasting Blood pressure sitting blood pressure sitting 129/70 when he stood up it dropped 118/64       Assessment & Plan:  Orthostatic hypotension Back down on the torsemide Hold off on it for a few days then just do it Monday Wednesday Friday Do lab work today if possible May continue Jardiance for now Follow-up blood pressure in 2 weeks I believe that the weakness is coming from overdiuresis  Physical therapy may be of some benefit but I think more importantly is to avoid excessive diuresis and low blood pressure

## 2022-09-29 ENCOUNTER — Other Ambulatory Visit: Payer: Self-pay | Admitting: Family Medicine

## 2022-09-29 ENCOUNTER — Ambulatory Visit: Payer: Medicare Other | Admitting: Family Medicine

## 2022-09-29 LAB — BASIC METABOLIC PANEL
BUN/Creatinine Ratio: 15 (ref 10–24)
BUN: 25 mg/dL (ref 8–27)
CO2: 27 mmol/L (ref 20–29)
Calcium: 9.8 mg/dL (ref 8.6–10.2)
Chloride: 103 mmol/L (ref 96–106)
Creatinine, Ser: 1.66 mg/dL — ABNORMAL HIGH (ref 0.76–1.27)
Glucose: 116 mg/dL — ABNORMAL HIGH (ref 70–99)
Potassium: 3.8 mmol/L (ref 3.5–5.2)
Sodium: 146 mmol/L — ABNORMAL HIGH (ref 134–144)
eGFR: 40 mL/min/{1.73_m2} — ABNORMAL LOW (ref >59)

## 2022-10-05 ENCOUNTER — Other Ambulatory Visit: Payer: Self-pay | Admitting: Family Medicine

## 2022-10-11 ENCOUNTER — Other Ambulatory Visit: Payer: Self-pay | Admitting: *Deleted

## 2022-10-11 DIAGNOSIS — R918 Other nonspecific abnormal finding of lung field: Secondary | ICD-10-CM

## 2022-10-12 ENCOUNTER — Encounter: Payer: Self-pay | Admitting: Cardiology

## 2022-10-12 ENCOUNTER — Ambulatory Visit (INDEPENDENT_AMBULATORY_CARE_PROVIDER_SITE_OTHER): Payer: Medicare Other | Admitting: Family Medicine

## 2022-10-12 ENCOUNTER — Ambulatory Visit: Payer: Medicare Other | Attending: Cardiology | Admitting: Cardiology

## 2022-10-12 VITALS — BP 126/72 | HR 66 | Temp 97.2°F | Ht 67.0 in | Wt 210.0 lb

## 2022-10-12 VITALS — BP 142/72 | HR 73 | Ht 67.0 in | Wt 210.0 lb

## 2022-10-12 DIAGNOSIS — I5022 Chronic systolic (congestive) heart failure: Secondary | ICD-10-CM | POA: Diagnosis not present

## 2022-10-12 DIAGNOSIS — G2581 Restless legs syndrome: Secondary | ICD-10-CM | POA: Diagnosis not present

## 2022-10-12 DIAGNOSIS — I951 Orthostatic hypotension: Secondary | ICD-10-CM | POA: Diagnosis not present

## 2022-10-12 DIAGNOSIS — I471 Supraventricular tachycardia, unspecified: Secondary | ICD-10-CM | POA: Diagnosis not present

## 2022-10-12 DIAGNOSIS — N1832 Chronic kidney disease, stage 3b: Secondary | ICD-10-CM

## 2022-10-12 NOTE — Progress Notes (Signed)
Cardiology Office Note  Date: 10/12/2022   ID: Gerald, Hall 08-May-1935, MRN [id number]  PCP:  Kathyrn Drown, MD  Cardiologist:  Rozann Lesches, MD Electrophysiologist:  None   Chief Complaint  Patient presents with   Cardiac follow-up    History of Present Illness: Gerald Hall is an 87 y.o. male last seen in October.  He is here today for a follow-up visit.  I reviewed interval notes per Dr. Wolfgang Phoenix.  We went over his current medications.  He is tolerating Jardiance, Demadex was cut back in the interim due to relative orthostasis.  He otherwise remains stable on Coreg and amiodarone.  Reports improved leg edema overall.  I did review his lab work, BUN and creatinine were 25 and 1.66 respectively, potassium 3.8.  Echocardiogram from March revealed LVEF 40 to 45% range with global hypokinesis.  Past Medical History:  Diagnosis Date   Aortic atherosclerosis ([place]) 06/23/2022   Seen on CAT scan   Arthritis    Balance problem    Uses a walker   Blood in urine    Sees urology yearly   Deafness in right ear age 82   Hyperlipidemia    Hypertension    PSVT (paroxysmal supraventricular tachycardia)    Reduced ejection fraction concurrent with and due to chronic heart failure ([name]) 06/23/2022   See echo spring 2023 patient with DOE and some pedal edema has chronic kidney disease stage IIIb   Temporal arteritis ([name]) 2016   Urinary incontinence     Past Surgical History:  Procedure Laterality Date   BACK SURGERY  2010   [place]     CATARACT EXTRACTION Bilateral 07/30/14, 08/13/2014   CERVICAL SPINE SURGERY  09/01/15   C 4-5 , [place]   COLONOSCOPY  12/19/2007   RMR: 1. External hemorrohids, otherwise normal rectum.2. Normal colon. 3. Normal terminal ileum.   COLONOSCOPY N/A 12/13/2014   Procedure: COLONOSCOPY;  Surgeon: Daneil Dolin, MD;  Location: [facility];  Service: Endoscopy;  Laterality: N/A;  11:15 Pt Request Time   KNEE  SURGERY Left    around 2000, arthroscopic   TOTAL HIP ARTHROPLASTY Right 02/23/2017   Procedure: TOTAL HIP ARTHROPLASTY ANTERIOR APPROACH;  Surgeon: Hessie Knows, MD;  Location: [facility] ORS;  Service: Orthopedics;  Laterality: Right;    Current Outpatient Medications  Medication Sig Dispense Refill   acetaminophen (TYLENOL) 325 MG tablet Take 1,000 mg by mouth every 6 (six) hours as needed for mild pain or headache.     albuterol (VENTOLIN HFA) 108 (90 Base) MCG/ACT inhaler Inhale 2 puffs into the lungs every 4 (four) hours as needed for wheezing. 18 g 5   amiodarone (PACERONE) 200 MG tablet Take 100 mg by mouth daily.     aspirin EC 325 MG EC tablet Take 1 tablet (325 mg total) by mouth daily. 30 tablet 0   budesonide-formoterol (SYMBICORT) 160-4.5 MCG/ACT inhaler Inhale 2 puffs into the lungs 2 (two) times daily. 10.2 g 12   calcium carbonate (OS-CAL) 600 MG TABS tablet Take 600 mg by mouth daily with breakfast.     carvedilol (COREG) 6.25 MG tablet Take 6.25 mg by mouth 2 (two) times daily.     cholecalciferol (VITAMIN D) 400 units TABS tablet Take 1,000 Units by mouth daily.     clonazePAM (KLONOPIN) 0.5 MG tablet 1 nightly as needed.  Insomnia caution drowsiness 30 tablet 2   Dextromethorphan-guaiFENesin (MUCINEX DM) 30-600 MG TB12 Take 1 tablet  by mouth daily.      empagliflozin (JARDIANCE) 10 MG TABS tablet Take 1 tablet (10 mg total) by mouth daily before breakfast. 90 tablet 1   finasteride (PROSCAR) 5 MG tablet TAKE ONE TABLET BY MOUTH EVERY DAY 90 tablet 0   fluticasone (FLONASE) 50 MCG/ACT nasal spray Place 2 sprays into both nostrils at bedtime. 16 g 12   ipratropium-albuterol (DUONEB) 0.5-2.5 (3) MG/3ML SOLN Take 3 mLs by nebulization every 4 (four) hours as needed. 360 mL 12   methimazole (TAPAZOLE) 5 MG tablet Take 1 tablet (5 mg total) by mouth daily. 90 tablet 1   mirabegron ER (MYRBETRIQ) 50 MG TB24 tablet Take 50 mg by mouth daily.     OXYGEN Inhale 2 L into the lungs  continuous.     oxymetazoline (AFRIN) 0.05 % nasal spray Place 1 spray into both nostrils at bedtime.     polyethylene glycol (MIRALAX / GLYCOLAX) 17 g packet Take 17 g by mouth daily as needed.     polyvinyl alcohol (LIQUIFILM TEARS) 1.4 % ophthalmic solution Place 1 drop into both eyes as needed for dry eyes.     potassium chloride (KLOR-CON) 10 MEQ tablet TAKE 1 TABLET BY MOUTH TWICE DAILY 180 tablet 0   potassium chloride (KLOR-CON) 10 MEQ tablet TAKE 1 TABLET BY MOUTH TWICE DAILY 180 tablet 1   rOPINIRole (REQUIP) 2 MG tablet 1 q afternnon and 1 qhs 180 tablet 3   sodium chloride (OCEAN) 0.65 % SOLN nasal spray Place 1 spray into both nostrils as needed for congestion.  0   tamsulosin (FLOMAX) 0.4 MG CAPS capsule TAKE (1) CAPSULE BY MOUTH TWICE DAILY. 180 capsule 1   torsemide (DEMADEX) 20 MG tablet Take 1 tablet (20 mg total) by mouth daily. 90 tablet 1   traMADol (ULTRAM) 50 MG tablet Take 1 tablet (50 mg total) by mouth every 12 (twelve) hours as needed. 60 tablet 3   vitamin C (ASCORBIC ACID) 500 MG tablet Take 500 mg by mouth daily.     Zinc Acetate, Oral, (ZINC ACETATE PO) Take 1 tablet by mouth daily.     No current facility-administered medications for this visit.   Allergies:  Cephalexin and Penicillins   ROS: No syncope.  Physical Exam: VS:  BP (!) 142/72   Pulse 73   Ht '5\' 7"'$  (1.702 m)   Wt 210 lb (95.3 kg)   SpO2 95%   BMI 32.89 kg/m , BMI Body mass index is 32.89 kg/m.  Wt Readings from Last 3 Encounters:  10/12/22 210 lb (95.3 kg)  10/12/22 210 lb (95.3 kg)  09/28/22 207 lb (93.9 kg)    General: Patient appears comfortable at rest.  Using supplemental oxygen via nasal cannula. HEENT: Conjunctiva and lids normal. Neck: Supple, no elevated JVP or carotid bruits. Lungs: Decreased breath sounds without wheezing. Cardiac: Regular rate and rhythm, no S3 or significant systolic murmur. Extremities: No pitting edema.  ECG:  An ECG dated 11/20/2021 was personally  reviewed today and demonstrated:  Sinus rhythm with prolonged PR interval.  Recent Labwork: 11/21/2021: ALT 23; AST 18 06/01/2022: Hemoglobin 13.6; Platelets 191 06/07/2022: TSH 0.376 08/27/2022: B Natriuretic Peptide 396.0 09/10/2022: Magnesium 2.3 09/28/2022: BUN 25; Creatinine, Ser 1.66; Potassium 3.8; Sodium 146     Component Value Date/Time   CHOL 183 08/05/2020 0932   TRIG 113 08/05/2020 0932   HDL 40 08/05/2020 0932   CHOLHDL 4.6 08/05/2020 0932   CHOLHDL 3.7 09/06/2014 1259   VLDL 29 09/06/2014  [place] (H) 08/05/2020 0932    Other Studies Reviewed Today:  Echocardiogram 01/05/2022:  1. Left ventricular ejection fraction, by estimation, is 40 to 45%. The  left ventricle has mildly decreased function. The left ventricle  demonstrates global hypokinesis. The left ventricular internal cavity size  was mildly dilated. There is mild  concentric left ventricular hypertrophy. Left ventricular diastolic  parameters are consistent with Grade I diastolic dysfunction (impaired  relaxation).   2. Right ventricular systolic function is normal. The right ventricular  size is normal. Tricuspid regurgitation signal is inadequate for assessing  PA pressure.   3. Left atrial size was moderately dilated.   4. The mitral valve is abnormal. Mild to moderate mitral valve  regurgitation. Moderate mitral annular calcification.   5. The aortic valve is tricuspid. Aortic valve regurgitation is trivial.   6. Aortic dilatation noted. There is borderline dilatation of the aortic  root, measuring 38 mm.   7. The inferior vena cava is normal in size with greater than 50%  respiratory variability, suggesting right atrial pressure of 3 mmHg.   Assessment and Plan:  1.  HFmrEF with LVEF 40 to 45% range.  He is doing better overall in terms of volume status.  Did have intermittent orthostasis resulting in decrease in Demadex dosing.  Recent blood pressures have been stable.  He is currently on  Coreg, Jardiance, Demadex, and potassium supplement.  Recent creatinine 1.66 with potassium 3.8.  For now we will hold off on further addition in therapy, still might be a candidate for either MRA or Entresto although would like to get a better sense of his blood pressure trend over time first.  Plan to bring him back with a repeat echocardiogram in the next few months.  2.  PSVT, has done well with rhythm suppression on amiodarone.  TSH and LFTs normal this year.  Medication Adjustments/Labs and Tests Ordered: Current medicines are reviewed at length with the patient today.  Concerns regarding medicines are outlined above.   Tests Ordered: Orders Placed This Encounter  Procedures   ECHOCARDIOGRAM COMPLETE    Medication Changes: No orders of the defined types were placed in this encounter.   Disposition:  Follow up  2 months.  Signed, Satira Sark, MD, [facility] 10/12/2022 3:37 PM    [facility] at [facility] [address] [place], [place] [address] Phone: [phone]; Fax: [phone]

## 2022-10-12 NOTE — Patient Instructions (Signed)
Medication Instructions: Your physician recommends that you continue on your current medications as directed. Please refer to the Current Medication list given to you today.   Labwork: None today  Testing/Procedures: Echo in 2 months  Follow-Up: 2 months   Any Other Special Instructions Will Be Listed Below (If Applicable).  If you need a refill on your cardiac medications before your next appointment, please call your pharmacy.

## 2022-10-12 NOTE — Progress Notes (Signed)
   Subjective:    Patient ID: Gerald Hall, male    DOB: [birth year], 87 y.o.   MRN: [id number]  HPI Patient here for follow-up Has chronic kidney disease Also has underlying COPD In addition to this frequent falls along with orthostatic hypotension Recently we adjusted the diuretic to just be 3 days/week He states that when he takes the diuretic (tends to urinate a lot but not as much on Wednesdays and Fridays States breathing is baseline He does state his restless legs giving him some difficulties but it seems that this is about where it normally is he is already on medication currently  Review of Systems     Objective:   Physical Exam General-in no acute distress Eyes-no discharge Lungs-respiratory rate normal, CTA CV-no murmurs,RRR Extremities skin warm dry no edema Neuro grossly normal Behavior normal, alert  Blood pressure check sitting and standing no significant drop      Assessment & Plan:   Orthostatic hypotension-this is now corrected  Chronic renal insufficiency lab review Follow-up in 1 month  Restless legs patient already on medication.

## 2022-10-15 DIAGNOSIS — H3589 Other specified retinal disorders: Secondary | ICD-10-CM | POA: Diagnosis not present

## 2022-10-15 DIAGNOSIS — H3530 Unspecified macular degeneration: Secondary | ICD-10-CM | POA: Diagnosis not present

## 2022-10-24 DIAGNOSIS — J432 Centrilobular emphysema: Secondary | ICD-10-CM | POA: Diagnosis not present

## 2022-10-24 DIAGNOSIS — R0609 Other forms of dyspnea: Secondary | ICD-10-CM | POA: Diagnosis not present

## 2022-10-31 ENCOUNTER — Other Ambulatory Visit: Payer: Self-pay | Admitting: Family Medicine

## 2022-11-04 ENCOUNTER — Ambulatory Visit (HOSPITAL_COMMUNITY)
Admission: RE | Admit: 2022-11-04 | Discharge: 2022-11-04 | Disposition: A | Discharge status: Home / Self Care | Payer: Medicare Other | Source: Ambulatory Visit | Attending: Family Medicine | Admitting: Family Medicine

## 2022-11-04 DIAGNOSIS — R918 Other nonspecific abnormal finding of lung field: Secondary | ICD-10-CM | POA: Insufficient documentation

## 2022-11-04 DIAGNOSIS — J9811 Atelectasis: Secondary | ICD-10-CM | POA: Diagnosis not present

## 2022-11-04 DIAGNOSIS — J439 Emphysema, unspecified: Secondary | ICD-10-CM | POA: Diagnosis not present

## 2022-11-16 ENCOUNTER — Ambulatory Visit: Payer: Medicare Other | Admitting: Family Medicine

## 2022-11-16 ENCOUNTER — Ambulatory Visit (INDEPENDENT_AMBULATORY_CARE_PROVIDER_SITE_OTHER): Payer: Medicare Other | Admitting: Family Medicine

## 2022-11-16 VITALS — BP 142/90 | HR 71 | Temp 97.0°F | Ht 67.0 in | Wt 207.0 lb

## 2022-11-16 DIAGNOSIS — D692 Other nonthrombocytopenic purpura: Secondary | ICD-10-CM | POA: Diagnosis not present

## 2022-11-16 DIAGNOSIS — N433 Hydrocele, unspecified: Secondary | ICD-10-CM

## 2022-11-16 DIAGNOSIS — J432 Centrilobular emphysema: Secondary | ICD-10-CM | POA: Diagnosis not present

## 2022-11-16 DIAGNOSIS — I5022 Chronic systolic (congestive) heart failure: Secondary | ICD-10-CM

## 2022-11-16 DIAGNOSIS — E785 Hyperlipidemia, unspecified: Secondary | ICD-10-CM

## 2022-11-16 DIAGNOSIS — N1832 Chronic kidney disease, stage 3b: Secondary | ICD-10-CM | POA: Diagnosis not present

## 2022-11-16 DIAGNOSIS — S40021A Contusion of right upper arm, initial encounter: Secondary | ICD-10-CM

## 2022-11-16 DIAGNOSIS — I700 Atherosclerosis of aorta: Secondary | ICD-10-CM

## 2022-11-16 DIAGNOSIS — M5432 Sciatica, left side: Secondary | ICD-10-CM

## 2022-11-16 MED ORDER — TRAMADOL HCL 50 MG PO TABS: ORAL_TABLET | ORAL | 3 refills | Status: DC

## 2022-11-16 MED ORDER — TORSEMIDE 20 MG PO TABS: ORAL_TABLET | ORAL | 1 refills | Status: DC

## 2022-11-16 MED ORDER — CARVEDILOL 3.125 MG PO TABS: 3.1250 mg | ORAL_TABLET | Freq: Two times a day (BID) | ORAL | 5 refills | Status: DC

## 2022-11-16 MED ORDER — AMLODIPINE BESYLATE 2.5 MG PO TABS: 2.5000 mg | ORAL_TABLET | Freq: Every day | ORAL | 3 refills | Status: DC

## 2022-11-16 NOTE — Progress Notes (Addendum)
   Subjective:    Patient ID: Gerald Hall, male    DOB: February 06, 1935, 88 y.o.   MRN: [id number]  HPI Follow up for CKD, COPD, orthostatic hypotension Patient reports a right upper arm bruise first observed while going to the restroom on 11/11/22 - bruising has spread since then - no falls or injuries reported  Sciatica, left side - Plan: Ambulatory referral to Orthopedics  Contusion of right upper extremity, initial encounter  Hydrocele of testis  Centrilobular emphysema (HCC), Chronic  Heart failure with mildly reduced ejection fraction (HFmrEF) (HCC), Chronic  Aortic atherosclerosis (HCC), Chronic  Senile purpura (HCC), Chronic  Stage 3b chronic kidney disease ([place]), Chronic History of heart failure under the care of specialist Bruising of the right arm occurred out of the blue settling toward his elbow Sciatica symptoms in the left leg intermittently over the past several days intermittently before that for off and on for weeks has had previous injections Swelling of the right testes present for the past few weeks no pain History of emphysema still smokes knows he needs to quit uses his inhalers History of heart failure under the care of specialist takes his medicine on a regular basis History of aortic atherosclerosis on cholesterol medicines for treatment Review of Systems     Objective:   Physical Exam  General-in no acute distress Eyes-no discharge Lungs-respiratory rate normal, CTA CV-no murmurs,RRR Extremities skin warm dry no edema Neuro grossly normal Behavior normal, alert       Assessment & Plan:  1. Sciatica, left side Did discuss various options including gabapentin he has tried Lyrica before and it never helped therefore we will bump up tramadol instead of 2/day he can go to a maximum of 3/day he will also go back to see Dr. Ernestina Patches who is done a previous injections with him - Ambulatory referral to Orthopedics  2. Contusion of right upper extremity,  initial encounter Reduce aspirin to 81 mg senile purpura related with this issue  3. Hydrocele of testis If this gets progressively worse referral to urology but currently hold off on this patient encourage to notify us if problems  4. Centrilobular emphysema ([place]) Recommend quitting smoking continue inhalers follow-up with problems  5. Heart failure with mildly reduced ejection fraction (HFmrEF) (HCC) No heart failure seen currently.  Continue current measures.   6. Aortic atherosclerosis (HCC) Aortic atherosclerosis seen on CAT scan.  Will discuss with patient whether or not he is willing to start a statin  7. Senile purpura (HCC) Bruising as noted above  8. Stage 3b chronic kidney disease ([place]) Kidney function stable Will monitor closely  Beta-blocker ordered lower dose per previous adjustment

## 2022-11-16 NOTE — Addendum Note (Signed)
Addended by: Sallee Lange A on: 11/16/2022 07:45 PM   Modules accepted: Orders

## 2022-11-17 ENCOUNTER — Other Ambulatory Visit: Payer: Self-pay | Admitting: Cardiology

## 2022-11-22 NOTE — Addendum Note (Signed)
Addended by: Vicente Males on: 11/22/2022 11:20 AM   Modules accepted: Orders

## 2022-11-22 NOTE — Progress Notes (Signed)
Lab orders placed and mailed to pt with note to have done before follow up visit

## 2022-11-24 DIAGNOSIS — J432 Centrilobular emphysema: Secondary | ICD-10-CM | POA: Diagnosis not present

## 2022-11-24 DIAGNOSIS — R0609 Other forms of dyspnea: Secondary | ICD-10-CM | POA: Diagnosis not present

## 2022-12-08 ENCOUNTER — Ambulatory Visit: Payer: Medicare Other | Admitting: Physical Medicine and Rehabilitation

## 2022-12-08 ENCOUNTER — Ambulatory Visit: Payer: Self-pay

## 2022-12-08 VITALS — BP 135/71 | HR 68

## 2022-12-08 DIAGNOSIS — M5416 Radiculopathy, lumbar region: Secondary | ICD-10-CM

## 2022-12-08 MED ORDER — METHYLPREDNISOLONE ACETATE 80 MG/ML IJ SUSP
80.0000 mg | Freq: Once | INTRAMUSCULAR | Status: AC
  Administered 2022-12-08: 80 mg

## 2022-12-08 NOTE — Patient Instructions (Signed)

## 2022-12-08 NOTE — Progress Notes (Signed)
Functional Pain Scale - descriptive words and definitions  Distressing (6)    Pain is present/unable to complete most ADLs limited by pain/sleep is difficult and active distraction is only marginal. Moderate range order  Average Pain  varies   +Driver, -BT, -Dye Allergies.  Lower back pain on left side that radiates into left leg. Standing and walking makes pain worse

## 2022-12-09 DIAGNOSIS — E0590 Thyrotoxicosis, unspecified without thyrotoxic crisis or storm: Secondary | ICD-10-CM | POA: Diagnosis not present

## 2022-12-10 LAB — T4, FREE: Free T4: 1.35 ng/dL (ref 0.82–1.77)

## 2022-12-10 LAB — TSH: TSH: 0.994 u[IU]/mL (ref 0.450–4.500)

## 2022-12-10 LAB — T3, FREE: T3, Free: 2 pg/mL (ref 2.0–4.4)

## 2022-12-13 ENCOUNTER — Ambulatory Visit: Payer: Medicare Other | Admitting: "Endocrinology

## 2022-12-13 ENCOUNTER — Telehealth: Payer: Self-pay | Admitting: "Endocrinology

## 2022-12-13 NOTE — Telephone Encounter (Signed)
Pt rescheduled his appointment for April 1, will his labs still be good or will he need them redone?

## 2022-12-14 NOTE — Telephone Encounter (Signed)
Called pt and lvm to let him know labs did not need to be redone

## 2022-12-15 ENCOUNTER — Telehealth: Payer: Self-pay

## 2022-12-15 ENCOUNTER — Ambulatory Visit (HOSPITAL_COMMUNITY)
Admission: RE | Admit: 2022-12-15 | Discharge: 2022-12-15 | Disposition: A | Discharge status: Home / Self Care | Payer: Medicare Other | Source: Ambulatory Visit | Attending: Cardiology | Admitting: Cardiology

## 2022-12-15 DIAGNOSIS — I5022 Chronic systolic (congestive) heart failure: Secondary | ICD-10-CM | POA: Diagnosis not present

## 2022-12-15 LAB — ECHOCARDIOGRAM COMPLETE
Area-P 1/2: 6.54 cm2
Calc EF: 34.5 %
S' Lateral: 4.8 cm
Single Plane A2C EF: 33.4 %
Single Plane A4C EF: 36.7 %

## 2022-12-15 NOTE — Progress Notes (Signed)
*  PRELIMINARY RESULTS* Echocardiogram 2D Echocardiogram has been performed.  Gerald Hall 12/15/2022, 2:11 PM

## 2022-12-15 NOTE — Telephone Encounter (Signed)
-----   Message from Satira Sark, MD sent at 12/15/2022  3:10 PM EST ----- Results reviewed.  Follow-up echocardiogram shows LVEF has decreased into the range of 30 to 35% in comparison to prior study.  Please make sure that he has office follow-up pending so that we can review his medications and see if there are any adjustments that could be made.

## 2022-12-15 NOTE — Telephone Encounter (Signed)
Patient notified and verbalized understanding. Patient had no questions or concerns at this time.  

## 2022-12-17 ENCOUNTER — Ambulatory Visit: Payer: Medicare Other | Attending: Cardiology | Admitting: Cardiology

## 2022-12-17 ENCOUNTER — Encounter: Payer: Self-pay | Admitting: Cardiology

## 2022-12-17 VITALS — BP 135/82 | HR 67 | Ht 69.0 in | Wt 201.6 lb

## 2022-12-17 DIAGNOSIS — I5020 Unspecified systolic (congestive) heart failure: Secondary | ICD-10-CM | POA: Diagnosis not present

## 2022-12-17 DIAGNOSIS — I471 Supraventricular tachycardia, unspecified: Secondary | ICD-10-CM

## 2022-12-17 DIAGNOSIS — Z79899 Other long term (current) drug therapy: Secondary | ICD-10-CM | POA: Diagnosis not present

## 2022-12-17 DIAGNOSIS — N1832 Chronic kidney disease, stage 3b: Secondary | ICD-10-CM

## 2022-12-17 MED ORDER — ENTRESTO 24-26 MG PO TABS: 1.0000 | ORAL_TABLET | Freq: Two times a day (BID) | ORAL | 6 refills | Status: DC

## 2022-12-17 NOTE — Patient Instructions (Addendum)
Medication Instructions:   STOP Amlodipine   START Entresto 24/26 mg Twice a day  [address] Offer given to you-present to pharmacist   Labwork:  BMET in 10 days (March 5th)   Testing/Procedures:  None today Follow-Up:  1 month   Any Other Special Instructions Will Be Listed Below (If Applicable).  If you need a refill on your cardiac medications before your next appointment, please call your pharmacy.

## 2022-12-17 NOTE — Progress Notes (Signed)
Cardiology Office Note  Date: 12/17/2022   ID: Gerald, Hall 1935-06-17, MRN [id number]  PCP:  Kathyrn Drown, MD  Cardiologist:  Rozann Lesches, MD Electrophysiologist:  None   Chief Complaint  Patient presents with   Cardiac follow-up    History of Present Illness: Gerald Hall is an 88 y.o. male last seen in December 2023.  He is here today with his son-in-law for follow-up visit.  We went over his medications which are outlined below.  He does not necessarily report any worsening breathlessness, but feels weak overall.  No sense of palpitations or chest pain.  He was concerned about the recent echocardiogram report and we discussed this today. Recent follow-up echocardiogram showed reduction in LVEF to the range of 30 to 35% in comparison to the prior study.  Weight is down about 6 pounds, he does not report any progressive leg edema.  Currently taking Demadex 20 mg on Mondays, Wednesdays, and Fridays as before.  We discussed attempting a transition from Norvasc to [place].  I reviewed some of his recent home blood pressure checks, no hypotension documented.  I personally reviewed his ECG today which shows sinus rhythm with prolonged PR interval, inferior Q waves, decreased R wave progression.  We did discuss the progressive nature of cardiomyopathy and heart failure, although will attempt to adjust therapy to help his symptoms and potentially improve LVEF if possible.  He does seem to be fairly realistic about his health at age 19.  Past Medical History:  Diagnosis Date   Aortic atherosclerosis ([place]) 06/23/2022   Seen on CAT scan   Arthritis    Balance problem    Uses a walker   Blood in urine    Sees urology yearly   Deafness in right ear age 30   Hyperlipidemia    Hypertension    PSVT (paroxysmal supraventricular tachycardia)    Reduced ejection fraction concurrent with and due to chronic heart failure ([place]) 06/23/2022   See echo spring 2023 patient with DOE  and some pedal edema has chronic kidney disease stage IIIb   Temporal arteritis ([place]) 2016   Urinary incontinence     Current Outpatient Medications  Medication Sig Dispense Refill   acetaminophen (TYLENOL) 325 MG tablet Take 1,000 mg by mouth every 6 (six) hours as needed for mild pain or headache.     albuterol (VENTOLIN HFA) 108 (90 Base) MCG/ACT inhaler Inhale 2 puffs into the lungs every 4 (four) hours as needed for wheezing. 18 g 5   amiodarone (PACERONE) 200 MG tablet Take 100 mg by mouth daily.     aspirin EC 81 MG tablet Take 81 mg by mouth daily. Swallow whole.     budesonide-formoterol (SYMBICORT) 160-4.5 MCG/ACT inhaler Inhale 2 puffs into the lungs 2 (two) times daily. 10.2 g 12   calcium carbonate (OS-CAL) 600 MG TABS tablet Take 600 mg by mouth daily with breakfast.     carvedilol (COREG) 3.125 MG tablet Take 1 tablet (3.125 mg total) by mouth 2 (two) times daily with a meal. 60 tablet 5   cholecalciferol (VITAMIN D) 400 units TABS tablet Take 1,000 Units by mouth daily.     clonazePAM (KLONOPIN) 0.5 MG tablet TAKE ONE TABLET BY MOUTH AT BEDTIME AS NEEDED FOR INSOMNIA. 30 tablet 2   Dextromethorphan-guaiFENesin (MUCINEX DM) 30-600 MG TB12 Take 1 tablet by mouth daily.      finasteride (PROSCAR) 5 MG tablet TAKE ONE TABLET BY MOUTH EVERY DAY 90 tablet  0   fluticasone (FLONASE) 50 MCG/ACT nasal spray Place 2 sprays into both nostrils at bedtime. 16 g 12   ipratropium-albuterol (DUONEB) 0.5-2.5 (3) MG/3ML SOLN Take 3 mLs by nebulization every 4 (four) hours as needed. 360 mL 12   JARDIANCE 10 MG TABS tablet TAKE ONE TABLET BY MOUTH ONCE DAILY BEFORE BREAKFAST 90 tablet 1   methimazole (TAPAZOLE) 5 MG tablet Take 1 tablet (5 mg total) by mouth daily. 90 tablet 1   mirabegron ER (MYRBETRIQ) 50 MG TB24 tablet Take 50 mg by mouth daily.     OXYGEN Inhale 2 L into the lungs continuous.     oxymetazoline (AFRIN) 0.05 % nasal spray Place 1 spray into both nostrils at bedtime.      polyethylene glycol (MIRALAX / GLYCOLAX) 17 g packet Take 17 g by mouth daily as needed.     polyvinyl alcohol (LIQUIFILM TEARS) 1.4 % ophthalmic solution Place 1 drop into both eyes as needed for dry eyes.     potassium chloride (KLOR-CON) 10 MEQ tablet TAKE 1 TABLET BY MOUTH TWICE DAILY 180 tablet 0   rOPINIRole (REQUIP) 2 MG tablet 1 q afternnon and 1 qhs 180 tablet 3   sacubitril-valsartan (ENTRESTO) 24-26 MG Take 1 tablet by mouth 2 (two) times daily. 60 tablet 6   tamsulosin (FLOMAX) 0.4 MG CAPS capsule TAKE (1) CAPSULE BY MOUTH TWICE DAILY. 180 capsule 1   torsemide (DEMADEX) 20 MG tablet TAKE ONE TAB BY MOUTH MONDAY, WEDNESDAY, FRIDAY     traMADol (ULTRAM) 50 MG tablet One tid prn pain 90 tablet 3   vitamin C (ASCORBIC ACID) 500 MG tablet Take 500 mg by mouth daily.     Zinc Acetate, Oral, (ZINC ACETATE PO) Take 1 tablet by mouth daily.     Current Facility-Administered Medications  Medication Dose Route Frequency Provider Last Rate Last Admin   methylPREDNISolone acetate (DEPO-MEDROL) injection 80 mg  80 mg Other Once Magnus Sinning, MD       Allergies:  Cephalexin and Penicillins   ROS: No syncope.  Physical Exam: VS:  BP 135/82   Pulse 67   Ht '5\' 9"'$  (1.753 m)   Wt 201 lb 9.6 oz (91.4 kg)   SpO2 95%   BMI 29.77 kg/m , BMI Body mass index is 29.77 kg/m.  Wt Readings from Last 3 Encounters:  12/17/22 201 lb 9.6 oz (91.4 kg)  11/16/22 207 lb (93.9 kg)  10/12/22 210 lb (95.3 kg)    General: Patient appears comfortable at rest.  Wearing oxygen via nasal cannula. HEENT: Conjunctiva and lids normal. Neck: Supple, no elevated JVP or carotid bruits. Lungs: Clear to auscultation, nonlabored breathing at rest. Cardiac: Regular rate and rhythm, no S3 or significant systolic murmur. Extremities: No pitting edema.  ECG:  An ECG dated 11/20/2021 was personally reviewed today and demonstrated:  Sinus rhythm with prolonged PR interval.  Recent Labwork: 06/01/2022: Hemoglobin 13.6;  Platelets 191 08/27/2022: B Natriuretic Peptide 396.0 09/10/2022: Magnesium 2.3 09/28/2022: BUN 25; Creatinine, Ser 1.66; Potassium 3.8; Sodium 146 12/09/2022: TSH 0.994     Component Value Date/Time   CHOL 183 08/05/2020 0932   TRIG 113 08/05/2020 0932   HDL 40 08/05/2020 0932   CHOLHDL 4.6 08/05/2020 0932   CHOLHDL 3.7 09/06/2014 1259   VLDL 29 09/06/2014 1259   LDLCALC 122 (H) 08/05/2020 0932    Other Studies Reviewed Today:  Echocardiogram 12/15/2022:  1. Left ventricular ejection fraction, by estimation, is 30 to 35%. The  left ventricle  has moderately decreased function. The left ventricle has  no regional wall motion abnormalities. There is mild left ventricular  hypertrophy. Left ventricular  diastolic parameters are consistent with Grade I diastolic dysfunction  (impaired relaxation).   2. Right ventricular systolic function is normal. The right ventricular  size is mildly enlarged. Tricuspid regurgitation signal is inadequate for  assessing PA pressure.   3. Left atrial size was moderately dilated.   4. Right atrial size was moderately dilated.   5. The mitral valve is normal in structure. Mild mitral valve  regurgitation. No evidence of mitral stenosis.   6. The aortic valve is tricuspid. Aortic valve regurgitation is not  visualized. No aortic stenosis is present.   7. The inferior vena cava is normal in size with greater than 50%  respiratory variability, suggesting right atrial pressure of 3 mmHg.   Assessment and Plan:  1.  HFrEF, recent echocardiogram shows decrease in LVEF to the range of 30 to 35%, RV contraction normal.  Presently with NYHA class II-III dyspnea, weight is down in comparison to last healthcare visit and he does not have any progressive leg edema at this point.  Plan is to stop Norvasc and attempt to transition to Entresto 24/26 mg twice daily.  Follow-up BMET in 10 days thereafter.  Otherwise continue Coreg, Jardiance, and Demadex with potassium  supplement.  Plan to hold off on MRA most likely in light of his renal insufficiency.  We will plan a follow-up visit for clinical reevaluation.  2.  CKD stage IIIb, recent creatinine 1.66 and potassium normal.  3.  History of PSVT, rhythm has been fairly well suppressed on amiodarone, he is in sinus rhythm today by ECG.  Last TSH and LFTs normal.  Medication Adjustments/Labs and Tests Ordered: Current medicines are reviewed at length with the patient today.  Concerns regarding medicines are outlined above.   Tests Ordered: Orders Placed This Encounter  Procedures   Basic metabolic panel   EKG [id number]    Medication Changes: Meds ordered this encounter  Medications   sacubitril-valsartan (ENTRESTO) 24-26 MG    Sig: Take 1 tablet by mouth 2 (two) times daily.    Dispense:  60 tablet    Refill:  6    12/17/22 Amlodipine stopped at OV    Disposition:  Follow up  1 month.  Signed, Satira Sark, MD, [facility] 12/17/2022 1:58 PM    [facility] [address] [place], [place] [address] Phone: [phone]; Fax: [phone]

## 2022-12-18 ENCOUNTER — Other Ambulatory Visit: Payer: Self-pay | Admitting: "Endocrinology

## 2022-12-18 DIAGNOSIS — E0590 Thyrotoxicosis, unspecified without thyrotoxic crisis or storm: Secondary | ICD-10-CM

## 2022-12-20 DIAGNOSIS — Z85828 Personal history of other malignant neoplasm of skin: Secondary | ICD-10-CM | POA: Diagnosis not present

## 2022-12-20 DIAGNOSIS — Z859 Personal history of malignant neoplasm, unspecified: Secondary | ICD-10-CM | POA: Diagnosis not present

## 2022-12-20 DIAGNOSIS — L570 Actinic keratosis: Secondary | ICD-10-CM | POA: Diagnosis not present

## 2022-12-20 DIAGNOSIS — Z872 Personal history of diseases of the skin and subcutaneous tissue: Secondary | ICD-10-CM | POA: Diagnosis not present

## 2022-12-20 DIAGNOSIS — L578 Other skin changes due to chronic exposure to nonionizing radiation: Secondary | ICD-10-CM | POA: Diagnosis not present

## 2022-12-20 NOTE — Progress Notes (Signed)
ABDULSALAM BERDINE - 88 y.o. male MRN [id number]  Date of birth: 02-15-1935  Office Visit Note: Visit Date: 12/08/2022 PCP: Kathyrn Drown, MD Referred by: Kathyrn Drown, MD  Subjective: Chief Complaint  Patient presents with   Lower Back - Pain   HPI:  LAILA CONDIT is a 88 y.o. male who comes in today at the request of Annie Main, PA-C for planned Left L5-S1 Lumbar Interlaminar epidural steroid injection with fluoroscopic guidance.  The patient has failed conservative care including home exercise, medications, time and activity modification.  This injection will be diagnostic and hopefully therapeutic.  Please see requesting physician notes for further details and justification.   ROS Otherwise per HPI.  Assessment & Plan: Visit Diagnoses:    ICD-10-CM   1. Lumbar radiculopathy  M54.16 XR C-ARM NO REPORT    Epidural Steroid injection    methylPREDNISolone acetate (DEPO-MEDROL) injection 80 mg      Plan: No additional findings.   Meds & Orders:  Meds ordered this encounter  Medications   methylPREDNISolone acetate (DEPO-MEDROL) injection 80 mg    Orders Placed This Encounter  Procedures   XR C-ARM NO REPORT   Epidural Steroid injection    Follow-up: Return for visit to requesting provider as needed.   Procedures: No procedures performed  Lumbar Epidural Steroid Injection - Interlaminar Approach with Fluoroscopic Guidance  Patient: AKIM STEGER      Date of Birth: 1935-06-05 MRN: [id number] PCP: Kathyrn Drown, MD      Visit Date: 12/08/2022   Universal Protocol:     Consent Given By: the patient  Position: PRONE  Additional Comments: Vital signs were monitored before and after the procedure. Patient was prepped and draped in the usual sterile fashion. The correct patient, procedure, and site was verified.   Injection Procedure Details:   Procedure diagnoses: Lumbar radiculopathy [M54.16]   Meds Administered:  Meds ordered this encounter   Medications   methylPREDNISolone acetate (DEPO-MEDROL) injection 80 mg     Laterality: Left  Location/Site:  L5-S1  Needle: 3.5 in., 20 ga. Tuohy  Needle Placement: Paramedian epidural  Findings:   -Comments: Excellent flow of contrast into the epidural space.  Procedure Details: Using a paramedian approach from the side mentioned above, the region overlying the inferior lamina was localized under fluoroscopic visualization and the soft tissues overlying this structure were infiltrated with 4 ml. of 1% Lidocaine without Epinephrine. The Tuohy needle was inserted into the epidural space using a paramedian approach.   The epidural space was localized using loss of resistance along with counter oblique bi-planar fluoroscopic views.  After negative aspirate for air, blood, and CSF, a 2 ml. volume of Isovue-250 was injected into the epidural space and the flow of contrast was observed. Radiographs were obtained for documentation purposes.    The injectate was administered into the level noted above.   Additional Comments:  No complications occurred Dressing: 2 x 2 sterile gauze and Band-Aid    Post-procedure details: Patient was observed during the procedure. Post-procedure instructions were reviewed.  Patient left the clinic in stable condition.   Clinical History: MRI LUMBAR SPINE WITHOUT CONTRAST   TECHNIQUE: Multiplanar, multisequence MR imaging of the lumbar spine was performed. No intravenous contrast was administered.   COMPARISON:  Lumbar spine x-rays dated February 12, 2022. MRI lumbar spine dated February 07, 2015.   FINDINGS: Segmentation:  Standard.   Alignment: Unchanged 5 mm anterolisthesis at L4-L5. Unchanged trace retrolisthesis at L5-S1.  Vertebrae: No fracture, evidence of discitis, or bone lesion. New degenerative endplate marrow edema at L2-L3.   Conus medullaris and cauda equina: Conus extends to the L1 level. Conus and cauda equina appear normal.    Paraspinal and other soft tissues: Bilateral renal simple cysts. No follow-up imaging is recommended. Otherwise negative.   Disc levels:   T12-L1:  Unchanged minimal disc bulging.  No stenosis.   L1-L2:  Unchanged mild disc bulging.  No stenosis.   L2-L3: Progressive moderate disc bulging and endplate spurring with new superimposed small right subarticular extruded disc fragment posterior to the L3 superior endplate (series 13, image 18; series 5, image 6). Progressive bilateral facet arthropathy. Worsened now severe spinal canal stenosis and moderate bilateral neuroforaminal stenosis.   L3-L4: Progressive moderate disc bulging and bilateral facet arthropathy with worsened now moderate spinal canal stenosis. Unchanged mild bilateral neuroforaminal stenosis.   L4-L5: Prior posterior decompression. Progressive moderate disc bulging with new superimposed left subarticular disc extrusion (series 13, image 28; series 5, image 9). Continued large left foraminal disc protrusion. Progressive severe bilateral facet arthropathy. New severe left lateral recess stenosis. Unchanged severe left and mild right neuroforaminal stenosis.   L5-S1: Unchanged small circumferential disc osteophyte complex and mild bilateral facet arthropathy. Unchanged mild bilateral neuroforaminal stenosis. No spinal canal stenosis.   IMPRESSION: 1. Progressive lumbar spondylosis as described above. New severe spinal canal stenosis and moderate bilateral neuroforaminal stenosis at L2-L3. 2. Progressive now moderate spinal canal stenosis at L3-L4. 3. New severe left lateral recess stenosis at L4-L5 due to new large left subarticular disc extrusion. Unchanged large left foraminal disc protrusion at this level with severe left neuroforaminal stenosis.     Electronically Signed   By: Titus Dubin M.D.   On: 03/15/2022 10:27     Objective:  VS:  HT:    WT:   BMI:     BP:135/71  HR:68bpm  TEMP: ( )   RESP:  Physical Exam Vitals and nursing note reviewed.  Constitutional:      General: He is not in acute distress.    Appearance: Normal appearance. He is not ill-appearing.  HENT:     Head: Normocephalic and atraumatic.     Right Ear: External ear normal.     Left Ear: External ear normal.     Nose: No congestion.  Eyes:     Extraocular Movements: Extraocular movements intact.  Cardiovascular:     Rate and Rhythm: Normal rate.     Pulses: Normal pulses.  Pulmonary:     Effort: Pulmonary effort is normal. No respiratory distress.  Abdominal:     General: There is no distension.     Palpations: Abdomen is soft.  Musculoskeletal:        General: No tenderness or signs of injury.     Cervical back: Neck supple.     Right lower leg: No edema.     Left lower leg: No edema.     Comments: Patient has good distal strength without clonus.  Skin:    Findings: No erythema or rash.  Neurological:     General: No focal deficit present.     Mental Status: He is alert and oriented to person, place, and time.     Sensory: No sensory deficit.     Motor: No weakness or abnormal muscle tone.     Coordination: Coordination normal.  Psychiatric:        Mood and Affect: Mood normal.  Behavior: Behavior normal.      Imaging: No results found.

## 2022-12-20 NOTE — Procedures (Signed)
Lumbar Epidural Steroid Injection - Interlaminar Approach with Fluoroscopic Guidance  Patient: Gerald Hall      Date of Birth: 04-29-1935 MRN: [id number] PCP: Kathyrn Drown, MD      Visit Date: 12/08/2022   Universal Protocol:     Consent Given By: the patient  Position: PRONE  Additional Comments: Vital signs were monitored before and after the procedure. Patient was prepped and draped in the usual sterile fashion. The correct patient, procedure, and site was verified.   Injection Procedure Details:   Procedure diagnoses: Lumbar radiculopathy [M54.16]   Meds Administered:  Meds ordered this encounter  Medications   methylPREDNISolone acetate (DEPO-MEDROL) injection 80 mg     Laterality: Left  Location/Site:  L5-S1  Needle: 3.5 in., 20 ga. Tuohy  Needle Placement: Paramedian epidural  Findings:   -Comments: Excellent flow of contrast into the epidural space.  Procedure Details: Using a paramedian approach from the side mentioned above, the region overlying the inferior lamina was localized under fluoroscopic visualization and the soft tissues overlying this structure were infiltrated with 4 ml. of 1% Lidocaine without Epinephrine. The Tuohy needle was inserted into the epidural space using a paramedian approach.   The epidural space was localized using loss of resistance along with counter oblique bi-planar fluoroscopic views.  After negative aspirate for air, blood, and CSF, a 2 ml. volume of Isovue-250 was injected into the epidural space and the flow of contrast was observed. Radiographs were obtained for documentation purposes.    The injectate was administered into the level noted above.   Additional Comments:  No complications occurred Dressing: 2 x 2 sterile gauze and Band-Aid    Post-procedure details: Patient was observed during the procedure. Post-procedure instructions were reviewed.  Patient left the clinic in stable condition.

## 2022-12-23 DIAGNOSIS — R0609 Other forms of dyspnea: Secondary | ICD-10-CM | POA: Diagnosis not present

## 2022-12-23 DIAGNOSIS — J432 Centrilobular emphysema: Secondary | ICD-10-CM | POA: Diagnosis not present

## 2022-12-27 ENCOUNTER — Other Ambulatory Visit: Payer: Self-pay | Admitting: Family Medicine

## 2022-12-28 DIAGNOSIS — Z79899 Other long term (current) drug therapy: Secondary | ICD-10-CM | POA: Diagnosis not present

## 2022-12-28 DIAGNOSIS — I5020 Unspecified systolic (congestive) heart failure: Secondary | ICD-10-CM | POA: Diagnosis not present

## 2022-12-29 ENCOUNTER — Telehealth: Payer: Self-pay | Admitting: Family Medicine

## 2022-12-29 ENCOUNTER — Telehealth: Payer: Self-pay | Admitting: Cardiology

## 2022-12-29 LAB — BASIC METABOLIC PANEL
BUN/Creatinine Ratio: 18 (ref 10–24)
BUN: 26 mg/dL (ref 8–27)
CO2: 23 mmol/L (ref 20–29)
Calcium: 9.9 mg/dL (ref 8.6–10.2)
Chloride: 100 mmol/L (ref 96–106)
Creatinine, Ser: 1.45 mg/dL — ABNORMAL HIGH (ref 0.76–1.27)
Glucose: 92 mg/dL (ref 70–99)
Potassium: 4.1 mmol/L (ref 3.5–5.2)
Sodium: 141 mmol/L (ref 134–144)
eGFR: 47 mL/min/{1.73_m2} — ABNORMAL LOW (ref >59)

## 2022-12-29 NOTE — Telephone Encounter (Signed)
No abnormal problems or breathing issues or swelling- on usual meds  Patient states he also gave readings to cardiology and they called him and said they were good- one spell of tachycardia was fine not to worry about it and continue current treatment.

## 2022-12-29 NOTE — Telephone Encounter (Signed)
Patient returned CMA's call. 

## 2022-12-29 NOTE — Telephone Encounter (Signed)
Nurses Please reach out to Gerald Hall Please let the patient know that I did receive his tax and I have instructed you all to reach out to him to see how he is doing to find out if he is having any abnormal symptoms  We will be able to help him be of this phone call-I am not allowed to send medical information via texting.  I did look at his readouts his blood pressure readings look good heart rate being elevated on the last reading but on all the other readings look good.  Has anything changed? Any breathing difficulties such as PND orthopnea? Any swelling issues in the lower legs?  On his usual medicines currently?

## 2022-12-29 NOTE — Telephone Encounter (Signed)
I agree, keep everything as is, when he comes for his office visit on 19th March please bring his log book sheet with him thank you

## 2022-12-29 NOTE — Telephone Encounter (Signed)
Patient notified and verbalized understanding. Patient had no questions or concerns at this time. 

## 2022-12-29 NOTE — Telephone Encounter (Signed)
-----   Message from Satira Sark, MD sent at 12/29/2022  9:49 AM EST ----- Results reviewed.  Follow-up lab work shows relatively stable renal function, creatinine actually improved to 1.45 down from 1.66 and potassium normal.  Continue with current medications and follow-up plan.

## 2023-01-05 NOTE — Progress Notes (Unsigned)
Cardiology Office Note  Date: 01/06/2023   ID: Gerald Hall, DOB May 22, 1935, MRN [id number]  History of Present Illness: Gerald Hall is an 88 y.o. male last seen in February.  He is here for a follow-up visit.  Reports stable NYHA class II-III dyspnea, no chest pain, no dizziness or syncope.  His weight has fluctuated some over the last few months, currently up 4 pounds.  No leg swelling however, no orthopnea or PND.  He continues to use supplemental oxygen as before.  We went over his medications, he has tolerated switch from Norvasc to [place].  Does mention that he had a spell of about 4 days at home and his heart rate was 115 consistent with his history of PSVT.  We discussed uptitrating amiodarone to 200 mg daily, also adjustments in his Demadex dosing.  I reviewed his follow-up lab work.  Physical Exam: VS:  BP (!) 142/78   Pulse 68   Ht '5\' 8"'$  (1.727 m)   Wt 205 lb (93 kg)   SpO2 98%   BMI 31.17 kg/m , BMI Body mass index is 31.17 kg/m.  Wt Readings from Last 3 Encounters:  01/06/23 205 lb (93 kg)  12/17/22 201 lb 9.6 oz (91.4 kg)  11/16/22 207 lb (93.9 kg)    General: Patient appears comfortable at rest. HEENT: Conjunctiva and lids normal. Neck: Supple, no elevated JVP or carotid bruits. Lungs: Clear to auscultation, nonlabored breathing at rest. Cardiac: Regular rate and rhythm, no S3 or significant systolic murmur. Extremities: No pitting edema.  ECG:  An ECG dated 12/17/2022 was personally reviewed today and demonstrated:  Minus rhythm with prolonged PR interval, inferior Q waves, decreased R wave progression.  Labwork: 06/01/2022: Hemoglobin 13.6; Platelets 191 08/27/2022: B Natriuretic Peptide 396.0 09/10/2022: Magnesium 2.3 12/09/2022: TSH 0.994 12/28/2022: BUN 26; Creatinine, Ser 1.45; Potassium 4.1; Sodium 141   Other Studies Reviewed Today:  Echocardiogram 12/15/2022:  1. Left ventricular ejection fraction, by estimation, is 30 to 35%. The  left  ventricle has moderately decreased function. The left ventricle has  no regional wall motion abnormalities. There is mild left ventricular  hypertrophy. Left ventricular  diastolic parameters are consistent with Grade I diastolic dysfunction  (impaired relaxation).   2. Right ventricular systolic function is normal. The right ventricular  size is mildly enlarged. Tricuspid regurgitation signal is inadequate for  assessing PA pressure.   3. Left atrial size was moderately dilated.   4. Right atrial size was moderately dilated.   5. The mitral valve is normal in structure. Mild mitral valve  regurgitation. No evidence of mitral stenosis.   6. The aortic valve is tricuspid. Aortic valve regurgitation is not  visualized. No aortic stenosis is present.   7. The inferior vena cava is normal in size with greater than 50%  respiratory variability, suggesting right atrial pressure of 3 mmHg.  Assessment and Plan:  1.  HFrEF with LVEF 30 to 35% by recent echocardiogram.  GDMT has been adjusted since last visit.  Currently on Coreg, Entresto, Jardiance and Demadex with potassium supplement.  Plan to hold off on MRA in light of fluctuating renal insufficiency and advanced age.  Adjusting Demadex to 10 mg every other day, continue potassium supplement.  Check BMET for next clinical visit.  2.  CKD stage IIIb, recent creatinine down to 1.45 with normal potassium.  3.  History of PSVT.  He remains on amiodarone for rhythm suppression.  Plan to increase amiodarone to 200 mg  daily for now.  Disposition:  Follow up  4 to 6 weeks.  Signed, Satira Sark, M.D., F.A.C.C.

## 2023-01-06 ENCOUNTER — Encounter: Payer: Self-pay | Admitting: Cardiology

## 2023-01-06 ENCOUNTER — Ambulatory Visit: Payer: Medicare Other | Attending: Cardiology | Admitting: Cardiology

## 2023-01-06 VITALS — BP 142/78 | HR 68 | Ht 68.0 in | Wt 205.0 lb

## 2023-01-06 DIAGNOSIS — I5020 Unspecified systolic (congestive) heart failure: Secondary | ICD-10-CM

## 2023-01-06 DIAGNOSIS — I471 Supraventricular tachycardia, unspecified: Secondary | ICD-10-CM | POA: Diagnosis not present

## 2023-01-06 DIAGNOSIS — N1832 Chronic kidney disease, stage 3b: Secondary | ICD-10-CM

## 2023-01-06 MED ORDER — AMIODARONE HCL 200 MG PO TABS: 200.0000 mg | ORAL_TABLET | Freq: Every day | ORAL | 1 refills | Status: DC

## 2023-01-06 MED ORDER — TORSEMIDE 20 MG PO TABS: 10.0000 mg | ORAL_TABLET | ORAL | 3 refills | Status: DC

## 2023-01-06 NOTE — Patient Instructions (Signed)
Medication Instructions:  INCREASE Amiodarone to 200 mg ( 1 tablet) daily  CHANGE Torsemide to 10 mg ( 1/2 tablet) every OTHER day  Labwork: BMET in 4- 6 weeks  Testing/Procedures: None today  Follow-Up: 4-6 weeks, Get lab work done [place] appointment  Any Other Special Instructions Will Be Listed Below (If Applicable).  If you need a refill on your cardiac medications before your next appointment, please call your pharmacy.

## 2023-01-11 ENCOUNTER — Ambulatory Visit: Payer: Medicare Other | Admitting: Family Medicine

## 2023-01-12 ENCOUNTER — Ambulatory Visit (INDEPENDENT_AMBULATORY_CARE_PROVIDER_SITE_OTHER): Payer: Medicare Other | Admitting: Family Medicine

## 2023-01-12 VITALS — BP 135/84 | HR 59 | Ht 68.0 in | Wt 205.0 lb

## 2023-01-12 DIAGNOSIS — E785 Hyperlipidemia, unspecified: Secondary | ICD-10-CM | POA: Diagnosis not present

## 2023-01-12 DIAGNOSIS — E061 Subacute thyroiditis: Secondary | ICD-10-CM

## 2023-01-12 DIAGNOSIS — N1832 Chronic kidney disease, stage 3b: Secondary | ICD-10-CM | POA: Diagnosis not present

## 2023-01-12 DIAGNOSIS — I482 Chronic atrial fibrillation, unspecified: Secondary | ICD-10-CM | POA: Diagnosis not present

## 2023-01-12 DIAGNOSIS — I700 Atherosclerosis of aorta: Secondary | ICD-10-CM

## 2023-01-12 DIAGNOSIS — M5432 Sciatica, left side: Secondary | ICD-10-CM | POA: Diagnosis not present

## 2023-01-12 DIAGNOSIS — I5022 Chronic systolic (congestive) heart failure: Secondary | ICD-10-CM

## 2023-01-12 MED ORDER — PREGABALIN 25 MG PO CAPS: 25.0000 mg | ORAL_CAPSULE | Freq: Two times a day (BID) | ORAL | 2 refills | Status: DC

## 2023-01-12 NOTE — Progress Notes (Signed)
   Subjective:    Patient ID: Gerald Hall, male    DOB: 10-Nov-1934, 88 y.o.   MRN: [id number]  HPI  Patient arrives for a follow up on blood pressure. Patient states he would like to stop going to endocrinologist and have care done here Patient states it is difficult for him to get to frequent appointments with other doctors because of no transportation  Patient also wants to know how often he can get an epidural- he got one last month and it did not help like previous one did Previous injection did not help as much is the one before if he would like to be considered to have it again Also would like ears checked His helper who is with him today is concerned he cannot hear as well as he used to Review of Systems     Objective:   Physical Exam General-in no acute distress Eyes-no discharge Lungs-respiratory rate normal, CTA CV-no murmurs, rate controlled Extremities skin warm dry no edema Neuro grossly normal Behavior normal, alert Eardrums are normal       Assessment & Plan:  1. Subacute thyroiditis Patient does not want to go back to endocrinology.  He states he currently just wants to be followed here in cardiology.  We will refill his medicines.  Will repeat lab work again later this year  2. Hyperlipidemia, unspecified hyperlipidemia type Continue statin healthy diet - Lipid panel  3. Sciatica, left side Will reach out to Dr. Ernestina Patches to see if he can get another injection  4. Chronic atrial fibrillation, unspecified (HCC) Rate is controlled continue current medication  5. Stage 3b chronic kidney disease ([place]) Disease process explained to the patient  6. Aortic atherosclerosis (HCC) Healthy diet  7. Heart failure with mildly reduced ejection fraction (HFmrEF) (HCC) Continue medications.  Healthy diet.  Patient taking too much torsemide he was instructed to cut back to half tablet daily  Will follow-up patient in 4 weeks because of his complexity I did send a  staff message to Dr. Ernestina Patches regarding possible additional back injection

## 2023-01-13 ENCOUNTER — Encounter: Payer: Self-pay | Admitting: Family Medicine

## 2023-01-13 LAB — LIPID PANEL
Chol/HDL Ratio: 4.1 ratio (ref 0.0–5.0)
Cholesterol, Total: 189 mg/dL (ref 100–199)
HDL: 46 mg/dL (ref >39)
LDL Chol Calc (NIH): 111 mg/dL — ABNORMAL HIGH (ref 0–99)
Triglycerides: 181 mg/dL — ABNORMAL HIGH (ref 0–149)
VLDL Cholesterol Cal: 32 mg/dL (ref 5–40)

## 2023-01-13 NOTE — Progress Notes (Signed)
Please mail to the patient 

## 2023-01-16 ENCOUNTER — Telehealth: Payer: Self-pay | Admitting: Family Medicine

## 2023-01-16 ENCOUNTER — Other Ambulatory Visit: Payer: Self-pay | Admitting: Family Medicine

## 2023-01-16 NOTE — Telephone Encounter (Signed)
Nurses Please touch base with patient to let him know that we did touch base with Dr. Ernestina Patches  in his office will reach out to him to help schedule for follow-up regarding his back troubles and whether or not they can do an injection in the near future-they will have to work through his insurance to find out when the next 1 would be "allowed"  Below is communication from Dr. Chriss Czar, Haze Rushing, MD  Kathyrn Drown, MD; Casilda Carls, CMA Dr. Wolfgang Phoenix,  I have copied Brittney my CMA on this so she can schedule but best would be for [place] to see him as follow up appointment to gauge what options we have ie., maybe different type of injection. Insurance sometimes does dictate timing but best to shim and talk through options. Likely can get injection done quickly however.  Laurence Spates

## 2023-01-17 NOTE — Telephone Encounter (Signed)
Patient notified of provider's recommendations and verbalized understanding.  

## 2023-01-20 ENCOUNTER — Telehealth: Payer: Self-pay

## 2023-01-20 NOTE — Telephone Encounter (Signed)
Patient called. I scheduled patient with [name] on April 3rd at 1:00 for 30 min.

## 2023-01-20 NOTE — Telephone Encounter (Signed)
IC LMVM for patient to return call--Needs OV 30 min appt with Megan to be scheduled to discuss back pain/injection options.

## 2023-01-20 NOTE — Telephone Encounter (Signed)
-----   Message from Magnus Sinning, MD sent at 01/13/2023  8:52 AM EDT ----- Dr. Wolfgang Phoenix,  I have copied Brittney my CMA on this so she can schedule but best would be for [place] to see him as follow up appointment to gauge what options we have ie., maybe different type of injection. Insurance sometimes does dictate timing but best to shim and talk through options. Likely can get injection done quickly however.  Laurence Spates  ----- Message ----- From: Kathyrn Drown, MD Sent: 01/12/2023   8:23 PM EDT To: Magnus Sinning, MD  Hi Dr.Newton  Mr. Gerald Hall is a mutual patient.  He relates he is having ongoing back pain with sciatica.  He states his last injection did not seem to help as much as the previous one.  He is interested in receiving another injection but does not know when he can do so based upon guidelines and insurance.  From your knowledge of all of this-when would he be eligible again for another injection?  Is this something you would need a new referral for or would your office be able to connect with him to help set this up?  Thank you for your input and your ongoing care  [facility]

## 2023-01-23 DIAGNOSIS — J432 Centrilobular emphysema: Secondary | ICD-10-CM | POA: Diagnosis not present

## 2023-01-23 DIAGNOSIS — R0609 Other forms of dyspnea: Secondary | ICD-10-CM | POA: Diagnosis not present

## 2023-01-24 ENCOUNTER — Ambulatory Visit: Payer: Medicare Other | Admitting: "Endocrinology

## 2023-01-26 ENCOUNTER — Ambulatory Visit: Payer: Medicare Other | Admitting: Physical Medicine and Rehabilitation

## 2023-01-26 ENCOUNTER — Inpatient Hospital Stay (HOSPITAL_COMMUNITY)
Admission: EM | Admit: 2023-01-26 | Discharge: 2023-02-04 | DRG: 521 | Disposition: A | Discharge status: Skilled Nursing Facility | Payer: Medicare Other | Attending: Internal Medicine | Admitting: Internal Medicine

## 2023-01-26 ENCOUNTER — Inpatient Hospital Stay (HOSPITAL_COMMUNITY): Payer: Medicare Other

## 2023-01-26 ENCOUNTER — Emergency Department (HOSPITAL_COMMUNITY): Payer: Medicare Other

## 2023-01-26 ENCOUNTER — Encounter (HOSPITAL_COMMUNITY): Payer: Self-pay

## 2023-01-26 ENCOUNTER — Other Ambulatory Visit: Payer: Self-pay

## 2023-01-26 DIAGNOSIS — K5900 Constipation, unspecified: Secondary | ICD-10-CM | POA: Diagnosis not present

## 2023-01-26 DIAGNOSIS — J42 Unspecified chronic bronchitis: Secondary | ICD-10-CM

## 2023-01-26 DIAGNOSIS — I471 Supraventricular tachycardia, unspecified: Secondary | ICD-10-CM | POA: Diagnosis not present

## 2023-01-26 DIAGNOSIS — Z7982 Long term (current) use of aspirin: Secondary | ICD-10-CM | POA: Diagnosis not present

## 2023-01-26 DIAGNOSIS — Z9981 Dependence on supplemental oxygen: Secondary | ICD-10-CM

## 2023-01-26 DIAGNOSIS — Z7951 Long term (current) use of inhaled steroids: Secondary | ICD-10-CM

## 2023-01-26 DIAGNOSIS — E785 Hyperlipidemia, unspecified: Secondary | ICD-10-CM | POA: Diagnosis present

## 2023-01-26 DIAGNOSIS — Z881 Allergy status to other antibiotic agents status: Secondary | ICD-10-CM

## 2023-01-26 DIAGNOSIS — H9190 Unspecified hearing loss, unspecified ear: Secondary | ICD-10-CM | POA: Diagnosis not present

## 2023-01-26 DIAGNOSIS — N179 Acute kidney failure, unspecified: Secondary | ICD-10-CM | POA: Diagnosis not present

## 2023-01-26 DIAGNOSIS — Z96652 Presence of left artificial knee joint: Secondary | ICD-10-CM | POA: Diagnosis not present

## 2023-01-26 DIAGNOSIS — I2510 Atherosclerotic heart disease of native coronary artery without angina pectoris: Secondary | ICD-10-CM | POA: Diagnosis not present

## 2023-01-26 DIAGNOSIS — S72002D Fracture of unspecified part of neck of left femur, subsequent encounter for closed fracture with routine healing: Secondary | ICD-10-CM | POA: Diagnosis not present

## 2023-01-26 DIAGNOSIS — I5023 Acute on chronic systolic (congestive) heart failure: Secondary | ICD-10-CM | POA: Diagnosis present

## 2023-01-26 DIAGNOSIS — J309 Allergic rhinitis, unspecified: Secondary | ICD-10-CM | POA: Diagnosis not present

## 2023-01-26 DIAGNOSIS — Z800 Family history of malignant neoplasm of digestive organs: Secondary | ICD-10-CM

## 2023-01-26 DIAGNOSIS — F17210 Nicotine dependence, cigarettes, uncomplicated: Secondary | ICD-10-CM | POA: Diagnosis present

## 2023-01-26 DIAGNOSIS — H04123 Dry eye syndrome of bilateral lacrimal glands: Secondary | ICD-10-CM | POA: Diagnosis not present

## 2023-01-26 DIAGNOSIS — I429 Cardiomyopathy, unspecified: Secondary | ICD-10-CM | POA: Diagnosis not present

## 2023-01-26 DIAGNOSIS — F17200 Nicotine dependence, unspecified, uncomplicated: Secondary | ICD-10-CM | POA: Diagnosis not present

## 2023-01-26 DIAGNOSIS — M47814 Spondylosis without myelopathy or radiculopathy, thoracic region: Secondary | ICD-10-CM | POA: Diagnosis not present

## 2023-01-26 DIAGNOSIS — Z96641 Presence of right artificial hip joint: Secondary | ICD-10-CM | POA: Diagnosis present

## 2023-01-26 DIAGNOSIS — Z833 Family history of diabetes mellitus: Secondary | ICD-10-CM

## 2023-01-26 DIAGNOSIS — J984 Other disorders of lung: Secondary | ICD-10-CM | POA: Diagnosis not present

## 2023-01-26 DIAGNOSIS — I4719 Other supraventricular tachycardia: Secondary | ICD-10-CM | POA: Diagnosis not present

## 2023-01-26 DIAGNOSIS — N401 Enlarged prostate with lower urinary tract symptoms: Secondary | ICD-10-CM | POA: Diagnosis present

## 2023-01-26 DIAGNOSIS — R112 Nausea with vomiting, unspecified: Secondary | ICD-10-CM | POA: Diagnosis not present

## 2023-01-26 DIAGNOSIS — E876 Hypokalemia: Secondary | ICD-10-CM | POA: Diagnosis not present

## 2023-01-26 DIAGNOSIS — R9431 Abnormal electrocardiogram [ECG] [EKG]: Secondary | ICD-10-CM

## 2023-01-26 DIAGNOSIS — Z471 Aftercare following joint replacement surgery: Secondary | ICD-10-CM | POA: Diagnosis not present

## 2023-01-26 DIAGNOSIS — J9611 Chronic respiratory failure with hypoxia: Secondary | ICD-10-CM | POA: Diagnosis not present

## 2023-01-26 DIAGNOSIS — W010XXA Fall on same level from slipping, tripping and stumbling without subsequent striking against object, initial encounter: Secondary | ICD-10-CM | POA: Diagnosis present

## 2023-01-26 DIAGNOSIS — N1831 Chronic kidney disease, stage 3a: Secondary | ICD-10-CM | POA: Diagnosis present

## 2023-01-26 DIAGNOSIS — E0590 Thyrotoxicosis, unspecified without thyrotoxic crisis or storm: Secondary | ICD-10-CM | POA: Diagnosis not present

## 2023-01-26 DIAGNOSIS — G4700 Insomnia, unspecified: Secondary | ICD-10-CM | POA: Diagnosis not present

## 2023-01-26 DIAGNOSIS — Z01810 Encounter for preprocedural cardiovascular examination: Secondary | ICD-10-CM | POA: Diagnosis not present

## 2023-01-26 DIAGNOSIS — E559 Vitamin D deficiency, unspecified: Secondary | ICD-10-CM | POA: Diagnosis not present

## 2023-01-26 DIAGNOSIS — Y92009 Unspecified place in unspecified non-institutional (private) residence as the place of occurrence of the external cause: Secondary | ICD-10-CM | POA: Diagnosis not present

## 2023-01-26 DIAGNOSIS — G8929 Other chronic pain: Secondary | ICD-10-CM | POA: Diagnosis not present

## 2023-01-26 DIAGNOSIS — Z6830 Body mass index (BMI) 30.0-30.9, adult: Secondary | ICD-10-CM

## 2023-01-26 DIAGNOSIS — J449 Chronic obstructive pulmonary disease, unspecified: Secondary | ICD-10-CM | POA: Diagnosis not present

## 2023-01-26 DIAGNOSIS — I10 Essential (primary) hypertension: Secondary | ICD-10-CM | POA: Diagnosis not present

## 2023-01-26 DIAGNOSIS — Z880 Allergy status to penicillin: Secondary | ICD-10-CM

## 2023-01-26 DIAGNOSIS — W19XXXD Unspecified fall, subsequent encounter: Secondary | ICD-10-CM | POA: Diagnosis not present

## 2023-01-26 DIAGNOSIS — Z7401 Bed confinement status: Secondary | ICD-10-CM | POA: Diagnosis not present

## 2023-01-26 DIAGNOSIS — Z66 Do not resuscitate: Secondary | ICD-10-CM | POA: Diagnosis not present

## 2023-01-26 DIAGNOSIS — I959 Hypotension, unspecified: Secondary | ICD-10-CM | POA: Diagnosis not present

## 2023-01-26 DIAGNOSIS — I130 Hypertensive heart and chronic kidney disease with heart failure and stage 1 through stage 4 chronic kidney disease, or unspecified chronic kidney disease: Secondary | ICD-10-CM | POA: Diagnosis not present

## 2023-01-26 DIAGNOSIS — Z720 Tobacco use: Secondary | ICD-10-CM | POA: Diagnosis not present

## 2023-01-26 DIAGNOSIS — Z79899 Other long term (current) drug therapy: Secondary | ICD-10-CM

## 2023-01-26 DIAGNOSIS — I440 Atrioventricular block, first degree: Secondary | ICD-10-CM | POA: Diagnosis present

## 2023-01-26 DIAGNOSIS — E669 Obesity, unspecified: Secondary | ICD-10-CM | POA: Diagnosis present

## 2023-01-26 DIAGNOSIS — R279 Unspecified lack of coordination: Secondary | ICD-10-CM | POA: Diagnosis not present

## 2023-01-26 DIAGNOSIS — S72002A Fracture of unspecified part of neck of left femur, initial encounter for closed fracture: Secondary | ICD-10-CM | POA: Diagnosis not present

## 2023-01-26 DIAGNOSIS — G9341 Metabolic encephalopathy: Secondary | ICD-10-CM | POA: Diagnosis not present

## 2023-01-26 DIAGNOSIS — N1832 Chronic kidney disease, stage 3b: Secondary | ICD-10-CM | POA: Diagnosis not present

## 2023-01-26 DIAGNOSIS — F05 Delirium due to known physiological condition: Secondary | ICD-10-CM | POA: Diagnosis not present

## 2023-01-26 DIAGNOSIS — I482 Chronic atrial fibrillation, unspecified: Secondary | ICD-10-CM | POA: Diagnosis present

## 2023-01-26 DIAGNOSIS — Z96642 Presence of left artificial hip joint: Secondary | ICD-10-CM | POA: Diagnosis not present

## 2023-01-26 DIAGNOSIS — S72042A Displaced fracture of base of neck of left femur, initial encounter for closed fracture: Secondary | ICD-10-CM | POA: Diagnosis not present

## 2023-01-26 DIAGNOSIS — Z7984 Long term (current) use of oral hypoglycemic drugs: Secondary | ICD-10-CM

## 2023-01-26 DIAGNOSIS — H9191 Unspecified hearing loss, right ear: Secondary | ICD-10-CM | POA: Diagnosis present

## 2023-01-26 DIAGNOSIS — I4510 Unspecified right bundle-branch block: Secondary | ICD-10-CM | POA: Diagnosis present

## 2023-01-26 DIAGNOSIS — I5022 Chronic systolic (congestive) heart failure: Secondary | ICD-10-CM | POA: Diagnosis not present

## 2023-01-26 DIAGNOSIS — R5381 Other malaise: Secondary | ICD-10-CM | POA: Diagnosis not present

## 2023-01-26 DIAGNOSIS — Z01818 Encounter for other preprocedural examination: Secondary | ICD-10-CM | POA: Diagnosis not present

## 2023-01-26 DIAGNOSIS — M25552 Pain in left hip: Secondary | ICD-10-CM | POA: Diagnosis not present

## 2023-01-26 LAB — CBC WITH DIFFERENTIAL/PLATELET
Abs Immature Granulocytes: 0.06 10*3/uL (ref 0.00–0.07)
Basophils Absolute: 0 10*3/uL (ref 0.0–0.1)
Basophils Relative: 0 %
Eosinophils Absolute: 0.2 10*3/uL (ref 0.0–0.5)
Eosinophils Relative: 1 %
HCT: 50.3 % (ref 39.0–52.0)
Hemoglobin: 16 g/dL (ref 13.0–17.0)
Immature Granulocytes: 1 %
Lymphocytes Relative: 9 %
Lymphs Abs: 1.2 10*3/uL (ref 0.7–4.0)
MCH: 31.4 pg (ref 26.0–34.0)
MCHC: 31.8 g/dL (ref 30.0–36.0)
MCV: 98.6 fL (ref 80.0–100.0)
Monocytes Absolute: 1.8 10*3/uL — ABNORMAL HIGH (ref 0.1–1.0)
Monocytes Relative: 14 %
Neutro Abs: 10 10*3/uL — ABNORMAL HIGH (ref 1.7–7.7)
Neutrophils Relative %: 75 %
Platelets: 202 10*3/uL (ref 150–400)
RBC: 5.1 MIL/uL (ref 4.22–5.81)
RDW: 14.7 % (ref 11.5–15.5)
WBC: 13.2 10*3/uL — ABNORMAL HIGH (ref 4.0–10.5)
nRBC: 0 % (ref 0.0–0.2)

## 2023-01-26 LAB — COMPREHENSIVE METABOLIC PANEL
ALT: 16 U/L (ref 0–44)
AST: 16 U/L (ref 15–41)
Albumin: 3.8 g/dL (ref 3.5–5.0)
Alkaline Phosphatase: 95 U/L (ref 38–126)
Anion gap: 9 (ref 5–15)
BUN: 29 mg/dL — ABNORMAL HIGH (ref 8–23)
CO2: 26 mmol/L (ref 22–32)
Calcium: 9.3 mg/dL (ref 8.9–10.3)
Chloride: 102 mmol/L (ref 98–111)
Creatinine, Ser: 1.38 mg/dL — ABNORMAL HIGH (ref 0.61–1.24)
GFR, Estimated: 50 mL/min — ABNORMAL LOW (ref >60)
Glucose, Bld: 127 mg/dL — ABNORMAL HIGH (ref 70–99)
Potassium: 3.9 mmol/L (ref 3.5–5.1)
Sodium: 137 mmol/L (ref 135–145)
Total Bilirubin: 0.6 mg/dL (ref 0.3–1.2)
Total Protein: 7.3 g/dL (ref 6.5–8.1)

## 2023-01-26 LAB — SURGICAL PCR SCREEN
MRSA, PCR: NEGATIVE
Staphylococcus aureus: NEGATIVE

## 2023-01-26 LAB — BRAIN NATRIURETIC PEPTIDE: B Natriuretic Peptide: 253 pg/mL — ABNORMAL HIGH (ref 0.0–100.0)

## 2023-01-26 MED ORDER — METHIMAZOLE 5 MG PO TABS
5.0000 mg | ORAL_TABLET | Freq: Every day | ORAL | Status: DC
  Administered 2023-01-27 – 2023-02-04 (×9): 5 mg via ORAL
  Filled 2023-01-26 (×9): qty 1

## 2023-01-26 MED ORDER — POLYETHYLENE GLYCOL 3350 17 G PO PACK
17.0000 g | PACK | Freq: Every day | ORAL | Status: DC | PRN
  Filled 2023-01-26: qty 1

## 2023-01-26 MED ORDER — CARVEDILOL 3.125 MG PO TABS
3.1250 mg | ORAL_TABLET | Freq: Two times a day (BID) | ORAL | Status: DC
  Administered 2023-01-27 – 2023-02-04 (×15): 3.125 mg via ORAL
  Filled 2023-01-26 (×15): qty 1

## 2023-01-26 MED ORDER — IPRATROPIUM-ALBUTEROL 0.5-2.5 (3) MG/3ML IN SOLN
3.0000 mL | RESPIRATORY_TRACT | Status: DC | PRN
  Administered 2023-01-28 – 2023-02-04 (×4): 3 mL via RESPIRATORY_TRACT
  Filled 2023-01-26 (×4): qty 3

## 2023-01-26 MED ORDER — TRANEXAMIC ACID-NACL 1000-0.7 MG/100ML-% IV SOLN
1000.0000 mg | INTRAVENOUS | Status: AC
  Administered 2023-01-27: 1000 mg via INTRAVENOUS
  Filled 2023-01-26: qty 100

## 2023-01-26 MED ORDER — ACETAMINOPHEN 650 MG RE SUPP: 650.0000 mg | Freq: Four times a day (QID) | RECTAL | Status: DC | PRN

## 2023-01-26 MED ORDER — AMIODARONE HCL 200 MG PO TABS
200.0000 mg | ORAL_TABLET | Freq: Every day | ORAL | Status: DC
  Administered 2023-01-27: 200 mg via ORAL
  Filled 2023-01-26: qty 1

## 2023-01-26 MED ORDER — FINASTERIDE 5 MG PO TABS
5.0000 mg | ORAL_TABLET | Freq: Every day | ORAL | Status: DC
  Administered 2023-01-27 – 2023-02-04 (×9): 5 mg via ORAL
  Filled 2023-01-26 (×9): qty 1

## 2023-01-26 MED ORDER — CEFAZOLIN SODIUM-DEXTROSE 2-4 GM/100ML-% IV SOLN
2.0000 g | INTRAVENOUS | Status: AC
  Administered 2023-01-27: 2 g via INTRAVENOUS
  Filled 2023-01-26: qty 100

## 2023-01-26 MED ORDER — MOMETASONE FURO-FORMOTEROL FUM 200-5 MCG/ACT IN AERO
2.0000 | INHALATION_SPRAY | Freq: Two times a day (BID) | RESPIRATORY_TRACT | Status: DC
  Administered 2023-01-26 – 2023-01-27 (×2): 2 via RESPIRATORY_TRACT
  Filled 2023-01-26: qty 8.8

## 2023-01-26 MED ORDER — ONDANSETRON HCL 4 MG/2ML IJ SOLN
4.0000 mg | Freq: Once | INTRAMUSCULAR | Status: AC
  Administered 2023-01-26: 4 mg via INTRAVENOUS
  Filled 2023-01-26: qty 2

## 2023-01-26 MED ORDER — TORSEMIDE 20 MG PO TABS: 10.0000 mg | ORAL_TABLET | ORAL | Status: DC

## 2023-01-26 MED ORDER — IPRATROPIUM-ALBUTEROL 0.5-2.5 (3) MG/3ML IN SOLN
3.0000 mL | Freq: Once | RESPIRATORY_TRACT | Status: AC
  Administered 2023-01-26: 3 mL via RESPIRATORY_TRACT
  Filled 2023-01-26: qty 3

## 2023-01-26 MED ORDER — MORPHINE SULFATE (PF) 2 MG/ML IV SOLN
2.0000 mg | INTRAVENOUS | Status: DC | PRN
  Administered 2023-01-26 – 2023-01-30 (×9): 2 mg via INTRAVENOUS
  Filled 2023-01-26 (×9): qty 1

## 2023-01-26 MED ORDER — ACETAMINOPHEN 325 MG PO TABS
650.0000 mg | ORAL_TABLET | Freq: Four times a day (QID) | ORAL | Status: DC | PRN
  Administered 2023-01-30 – 2023-01-31 (×3): 650 mg via ORAL
  Filled 2023-01-26 (×3): qty 2

## 2023-01-26 MED ORDER — MORPHINE SULFATE (PF) 4 MG/ML IV SOLN
4.0000 mg | Freq: Once | INTRAVENOUS | Status: AC
  Administered 2023-01-26: 4 mg via INTRAVENOUS
  Filled 2023-01-26: qty 1

## 2023-01-26 MED ORDER — IPRATROPIUM-ALBUTEROL 0.5-2.5 (3) MG/3ML IN SOLN
3.0000 mL | Freq: Three times a day (TID) | RESPIRATORY_TRACT | Status: AC
  Administered 2023-01-27: 3 mL via RESPIRATORY_TRACT
  Filled 2023-01-26: qty 3

## 2023-01-26 MED ORDER — SACUBITRIL-VALSARTAN 24-26 MG PO TABS
1.0000 | ORAL_TABLET | Freq: Two times a day (BID) | ORAL | Status: DC
  Administered 2023-01-26 – 2023-01-27 (×3): 1 via ORAL
  Filled 2023-01-26 (×3): qty 1

## 2023-01-26 NOTE — Assessment & Plan Note (Addendum)
AKI on CKD   Renal function at the time of discharge with serum cr at 1,51 with K at 3,8 and serum bicarbonate at 29. Na is 138 and Mg 2,8  Follow up renal function and electrolytes as outpatient.

## 2023-01-26 NOTE — Assessment & Plan Note (Signed)
Resume methimazole

## 2023-01-26 NOTE — Assessment & Plan Note (Addendum)
Chronic hypoxemic respiratory failure.  No clinical signs of exacerbation.  Continue with bronchodilator therapy.

## 2023-01-26 NOTE — Assessment & Plan Note (Addendum)
Patient with intermittent hypotension, plan to continue holding entresto until follow up as outpatient.   Systolic blood pressure 122 to 102 mmHg.

## 2023-01-26 NOTE — H&P (Addendum)
History and Physical    Gerald Hall [id number] DOB: 08-May-1935 DOA: 01/26/2023  PCP: Kathyrn Drown, MD   Patient coming from: Home  I have personally briefly reviewed patient's old medical records in [place]  Chief Complaint: Fall, left hip pain  HPI: Gerald Hall is a 88 y.o. male with medical history significant for BPH, atria fib, COPD with chronic respiratory failure, CKD3.  Patient was brought to the ED with reports of a fall and subsequent left hip pain.  At baseline patient ambulates with a walker.  Today he bent over to pick something from the floor (Tissue) and fell over onto his left hip. He otherwise has no chest pain or difficulty breathing, no dizziness.  No memory problems.  He has a caregiver that lives with him.  No cough.  Swelling to his bilateral lower extremities that is at baseline today.  He is on chronic O2.  ED Course: Temperature 98.3.  Heart rate 70s.  Respiratory rate 16-19.  Blood pressure systolic 0000000 to [id number].  O2 sats 91 to 97% on 2 L. X-ray shows acute displaced left femoral neck fracture.  EDP talked to ortho-Dr. Amedeo Kinsman, recommended hospitalist admission, plan for surgery tomorrow afternoon, n.p.o. midnight.  Review of Systems: As per HPI all other systems reviewed and negative.  Past Medical History:  Diagnosis Date   Aortic atherosclerosis 06/23/2022   Seen on CAT scan   Arthritis    Balance problem    Uses a walker   Blood in urine    Sees urology yearly   Deafness in right ear age 37   Hyperlipidemia    Hypertension    PSVT (paroxysmal supraventricular tachycardia)    Reduced ejection fraction concurrent with and due to chronic heart failure 06/23/2022   See echo spring 2023 patient with DOE and some pedal edema has chronic kidney disease stage IIIb   Temporal arteritis 2016   Urinary incontinence     Past Surgical History:  Procedure Laterality Date   BACK SURGERY  2010   [place]     CATARACT EXTRACTION  Bilateral 07/30/14, 08/13/2014   CERVICAL SPINE SURGERY  09/01/15   C 4-5 , [place]   COLONOSCOPY  12/19/2007   RMR: 1. External hemorrohids, otherwise normal rectum.2. Normal colon. 3. Normal terminal ileum.   COLONOSCOPY N/A 12/13/2014   Procedure: COLONOSCOPY;  Surgeon: Daneil Dolin, MD;  Location: [facility];  Service: Endoscopy;  Laterality: N/A;  11:15 Pt Request Time   KNEE SURGERY Left    around 2000, arthroscopic   TOTAL HIP ARTHROPLASTY Right 02/23/2017   Procedure: TOTAL HIP ARTHROPLASTY ANTERIOR APPROACH;  Surgeon: Hessie Knows, MD;  Location: [facility] ORS;  Service: Orthopedics;  Laterality: Right;     reports that he has been smoking cigarettes. He has been smoking an average of .5 packs per day. He has never used smokeless tobacco. He reports current alcohol use. He reports that he does not use drugs.  Allergies  Allergen Reactions   Cephalexin Diarrhea    And general weakness   Penicillins Other (See Comments)    GI UPSET    Family History  Problem Relation Age of Onset   Colon cancer Brother    Diabetes Brother    Diabetes Brother     Prior to Admission medications   Medication Sig Start Date End Date Taking? Authorizing Provider  acetaminophen (TYLENOL) 325 MG tablet Take 1,000 mg by mouth every 6 (six) hours  as needed for mild pain or headache.    [provider]  albuterol (VENTOLIN HFA) 108 (90 Base) MCG/ACT inhaler Inhale 2 puffs into the lungs every 4 (four) hours as needed for wheezing. 09/01/22   Kathyrn Drown, MD  amiodarone (PACERONE) 200 MG tablet Take 1 tablet (200 mg total) by mouth daily. 01/06/23   Satira Sark, MD  aspirin EC 81 MG tablet Take 81 mg by mouth daily. Swallow whole.    [provider]  budesonide-formoterol (SYMBICORT) 160-4.5 MCG/ACT inhaler Inhale 2 puffs into the lungs 2 (two) times daily. 08/27/22   Kathyrn Drown, MD  calcium carbonate (OS-CAL) 600 MG TABS tablet Take 600 mg by mouth daily with  breakfast.    [provider]  carvedilol (COREG) 3.125 MG tablet Take 1 tablet (3.125 mg total) by mouth 2 (two) times daily with a meal. 11/16/22   Luking, Elayne Snare, MD  cholecalciferol (VITAMIN D) 400 units TABS tablet Take 1,000 Units by mouth daily.    [provider]  clonazePAM (KLONOPIN) 0.5 MG tablet TAKE ONE TABLET BY MOUTH AT BEDTIME AS NEEDED FOR INSOMNIA. 01/19/23   Kathyrn Drown, MD  Dextromethorphan-guaiFENesin (MUCINEX DM) 30-600 MG TB12 Take 1 tablet by mouth daily.     [provider]  finasteride (PROSCAR) 5 MG tablet TAKE ONE TABLET BY MOUTH EVERY DAY 12/30/22   Kathyrn Drown, MD  fluticasone (FLONASE) 50 MCG/ACT nasal spray Place 2 sprays into both nostrils at bedtime. 08/27/22   Kathyrn Drown, MD  ipratropium-albuterol (DUONEB) 0.5-2.5 (3) MG/3ML SOLN Take 3 mLs by nebulization every 4 (four) hours as needed. 08/27/22   Kathyrn Drown, MD  JARDIANCE 10 MG TABS tablet TAKE ONE TABLET BY MOUTH ONCE DAILY BEFORE BREAKFAST 11/17/22   Satira Sark, MD  methimazole (TAPAZOLE) 5 MG tablet TAKE ONE TABLET BY MOUTH EVERY DAY 12/21/22   Cassandria Anger, MD  mirabegron ER (MYRBETRIQ) 50 MG TB24 tablet Take 50 mg by mouth daily.    [provider]  OXYGEN Inhale 2 L into the lungs continuous.    [provider]  oxymetazoline (AFRIN) 0.05 % nasal spray Place 1 spray into both nostrils at bedtime.    [provider]  polyethylene glycol (MIRALAX / GLYCOLAX) 17 g packet Take 17 g by mouth daily as needed.    [provider]  polyvinyl alcohol (LIQUIFILM TEARS) 1.4 % ophthalmic solution Place 1 drop into both eyes as needed for dry eyes.    [provider]  potassium chloride (KLOR-CON) 10 MEQ tablet TAKE 1 TABLET BY MOUTH TWICE DAILY 06/11/21   Kathyrn Drown, MD  pregabalin (LYRICA) 25 MG capsule Take 1 capsule (25 mg total) by mouth 2 (two) times daily. 01/12/23   Kathyrn Drown, MD  rOPINIRole (REQUIP) 2 MG  tablet 1 q afternnon and 1 qhs 08/27/22   Luking, Scott A, MD  sacubitril-valsartan (ENTRESTO) 24-26 MG Take 1 tablet by mouth 2 (two) times daily. 12/17/22   Satira Sark, MD  tamsulosin (FLOMAX) 0.4 MG CAPS capsule TAKE (1) CAPSULE BY MOUTH TWICE DAILY. 09/03/22   Kathyrn Drown, MD  torsemide (DEMADEX) 20 MG tablet Take 0.5 tablets (10 mg total) by mouth every other day. 01/06/23 01/06/24  Satira Sark, MD  traMADol Veatrice Bourbon) 50 MG tablet One tid prn pain 11/16/22   Kathyrn Drown, MD  vitamin C (ASCORBIC ACID) 500 MG tablet Take 500 mg by mouth daily.  [provider]  Zinc Acetate, Oral, (ZINC ACETATE PO) Take 1 tablet by mouth daily.    [provider]    Physical Exam: Vitals:   01/26/23 1708 01/26/23 1709 01/26/23 1813 01/26/23 1940  BP: (!) 178/92  (!) 171/96 (!) 161/95  Pulse: 79  78 76  Resp: 18  19 18   Temp: 98.3 F (36.8 C)     TempSrc: Oral     SpO2: 94%  97% 93%  Weight:  90.7 kg    Height:  5\' 8"  (1.727 m)      Constitutional: NAD, calm, comfortable Vitals:   01/26/23 1708 01/26/23 1709 01/26/23 1813 01/26/23 1940  BP: (!) 178/92  (!) 171/96 (!) 161/95  Pulse: 79  78 76  Resp: 18  19 18   Temp: 98.3 F (36.8 C)     TempSrc: Oral     SpO2: 94%  97% 93%  Weight:  90.7 kg    Height:  5\' 8"  (1.727 m)     Eyes: PERRL, lids and conjunctivae normal ENMT: Hearing loss right ear, limited hearing from left ear, mucous membranes are moist.  Neck: normal, supple, no masses, no thyromegaly Respiratory: Diffuse expiratory wheezing, no crackles. Normal respiratory effort. No accessory muscle use.  Cardiovascular: Regular rate and rhythm, no murmurs / rubs / gallops.  1+ pitting bilateral lower extremity edema to mid leg .  Extremities warm. Abdomen: no tenderness, no masses palpated. No hepatosplenomegaly. Bowel sounds positive.  Musculoskeletal: no clubbing / cyanosis. No joint deformity upper and lower extremities.  Skin: no rashes, lesions,  ulcers. No induration Neurologic: 4+5 bilateral upper extremities, able to move right lower extremity, left not tested.  No facial asymmetry, speech without aphasia. Psychiatric: Normal judgment and insight. Alert and oriented x 3. Normal mood.   Labs on Admission: I have personally reviewed following labs and imaging studies  CBC: Recent Labs  Lab 01/26/23 1823  WBC 13.2*  NEUTROABS 10.0*  HGB 16.0  HCT 50.3  MCV 98.6  PLT [id number]   Basic Metabolic Panel: Recent Labs  Lab 01/26/23 1823  NA 137  K 3.9  CL 102  CO2 26  GLUCOSE 127*  BUN 29*  CREATININE 1.38*  CALCIUM 9.3   GFR: Estimated Creatinine Clearance: 42.8 mL/min (A) (by C-G formula based on SCr of 1.38 mg/dL (H)). Liver Function Tests: Recent Labs  Lab 01/26/23 1823  AST 16  ALT 16  ALKPHOS 95  BILITOT 0.6  PROT 7.3  ALBUMIN 3.8    Radiological Exams on Admission: DG Hip Unilat W or Wo Pelvis 2-3 Views Left  Result Date: 01/26/2023 CLINICAL DATA:  Fall with left hip pain EXAM: DG HIP (WITH OR WITHOUT PELVIS) 2-3V LEFT COMPARISON:  10/14/2015 FINDINGS: Right hip replacement. Pubic symphysis and rami appear intact. Acute left femoral neck fracture with superior displacement of the trochanter. No femoral head dislocation IMPRESSION: Acute displaced left femoral neck fracture. Electronically Signed   By: Donavan Foil M.D.   On: 01/26/2023 17:43    EKG: Independently reviewed.  Prolonged PR interval at 345, regular rhythm.  QTc 536.  Assessment/Plan Principal Problem:   Closed displaced fracture of left femoral neck Active Problems:   Secondary cardiomyopathy   Chronic atrial fibrillation, unspecified   COPD (chronic obstructive pulmonary disease)   HTN (hypertension), benign   Hyperthyroidism   CKD stage 3a, GFR 45-59 ml/min   Chronic respiratory failure with hypoxia  Assessment and Plan: * Closed displaced fracture of left femoral neck Status  post mechanical fall.  Pelvic x-ray shows acute displaced  left femoral neck fracture. - EDP to Dr. Amedeo Kinsman, will see in consult in a.m., n.p.o. midnight -IV morphine 2 mg every 4 hourly as needed -Preop chest x-ray -Will request cardiology consult for clearance  Secondary cardiomyopathy Chronic bilateral 1+ pitting edema, unchanged.  Last echo shows EF of 30 to 35%, with grade 1 DD. -Resume Entresto, Coreg, Jardiance,  -Resume torsemide 10mg  every 48 hours  -Check BNP -Chest x-ray  COPD (chronic obstructive pulmonary disease) Diffuse wheezing on exam, no cough, denies dyspnea, reports he missed 2 doses of his bronchodilators.  On 2 L chronically. -Resume DuoNebs, and home regimen  Chronic atrial fibrillation, unspecified Sinus rhythm, rate Controlled on amiodarone and carvedilol.  Not on anticoagulation.  Follows with Dr. Conni Elliot.  QTc prolonged at 536, also with first-degree heart block with PR interval of 345-  Not new. -Resume amiodarone, carvedilol  CKD stage 3a, GFR 45-59 ml/min Creatinine stable- 1.38. -Resume Entresto  Hyperthyroidism Resume methimazole  HTN (hypertension), benign Systolic 0000000 to [id number]. -Resume carvedilol, tamsulosin  Chronic respiratory failure with hypoxia On 2 L.   DVT prophylaxis: SCDS Code Status: DNR, confirmed with patient and daughter Judson Roch at bedside. Family Communication: Daughter Judson Roch at bedside.  She and her Sister Bethel Born. Disposition Plan:  > 2 days Consults called: Cardiology, Ortho Admission status: INpt tele I certify that at the point of admission it is my clinical judgment that the patient will require inpatient hospital care spanning beyond 2 midnights from the point of admission due to high intensity of service, high risk for further deterioration and high frequency of surveillance required.   Author: Bethena Roys, MD 01/26/2023 9:50 PM  For on call review [email or web address].

## 2023-01-26 NOTE — ED Notes (Signed)
Dark green, light green, lav, blue, and gold tubes sent to lab.  Pink tube at bedside.

## 2023-01-26 NOTE — ED Triage Notes (Signed)
Pt reports walking up hall this morning using walker, fell, injuring left hip. Pt has had right hip replacement, but says he has been pain and weakness in the left hip for some time prior to fall today.

## 2023-01-26 NOTE — Assessment & Plan Note (Deleted)
On 2 L. ?

## 2023-01-26 NOTE — Assessment & Plan Note (Addendum)
Continue with amiodarone and carvedilol.  Currently not on anticoagulation, follow up as outpatient with Cardiology.

## 2023-01-26 NOTE — Assessment & Plan Note (Deleted)
Chronic bilateral 1+ pitting edema, unchanged.  Last echo shows EF of 30 to 35%, with grade 1 DD. -Resume Entresto, Coreg, Jardiance,  -Resume torsemide 10mg  every 48 hours  -Check BNP -Chest x-ray

## 2023-01-26 NOTE — Assessment & Plan Note (Addendum)
Patient was admitted to the medical ward. He had a pre op cardiology evaluation.  Underwent left hip hemiarthroplasty with no major complications. Pain control with oral and IV analgesics.  PT and OT have recommended to continue therapy at SNF.

## 2023-01-26 NOTE — ED Provider Notes (Signed)
[place] EMERGENCY DEPARTMENT AT [facility] Provider Note   CSN: [id number] Arrival date & time: 01/26/23  1553     History  Chief Complaint  Patient presents with   Fall    Left hip pain    Gerald Hall is a 88 y.o. male with a history significant for CHF, hypertension, history of chronic atrial fibrillation on baby aspirin only also on chronic home O2 presenting for evaluation of left hip pain after he lost his balance and fell in his home around 8 AM this morning landing on his left hip.  He states he bent over to simply pick up a piece of paper which caused his injury.  He denies any other complaint of pain, he did not hit his head, denies headache, neck pain, back pain.  He has been able to ambulate for brief periods throughout the course of the day, he has hydrocodone at home for chronic pain, took 2 tablets around 1 PM with significant pain relief.  He denies numbness in the left leg.  The history is provided by the patient (Daughter and son at bedside).       Home Medications Prior to Admission medications   Medication Sig Start Date End Date Taking? Authorizing Provider  acetaminophen (TYLENOL) 325 MG tablet Take 1,000 mg by mouth every 6 (six) hours as needed for mild pain or headache.    [provider]  albuterol (VENTOLIN HFA) 108 (90 Base) MCG/ACT inhaler Inhale 2 puffs into the lungs every 4 (four) hours as needed for wheezing. 09/01/22   Babs Sciara, MD  amiodarone (PACERONE) 200 MG tablet Take 1 tablet (200 mg total) by mouth daily. 01/06/23   Jonelle Sidle, MD  aspirin EC 81 MG tablet Take 81 mg by mouth daily. Swallow whole.    [provider]  budesonide-formoterol (SYMBICORT) 160-4.5 MCG/ACT inhaler Inhale 2 puffs into the lungs 2 (two) times daily. 08/27/22   Babs Sciara, MD  calcium carbonate (OS-CAL) 600 MG TABS tablet Take 600 mg by mouth daily with breakfast.    [provider]  carvedilol (COREG) 3.125 MG  tablet Take 1 tablet (3.125 mg total) by mouth 2 (two) times daily with a meal. 11/16/22   Luking, Jonna Coup, MD  cholecalciferol (VITAMIN D) 400 units TABS tablet Take 1,000 Units by mouth daily.    [provider]  clonazePAM (KLONOPIN) 0.5 MG tablet TAKE ONE TABLET BY MOUTH AT BEDTIME AS NEEDED FOR INSOMNIA. 01/19/23   Babs Sciara, MD  Dextromethorphan-guaiFENesin (MUCINEX DM) 30-600 MG TB12 Take 1 tablet by mouth daily.     [provider]  finasteride (PROSCAR) 5 MG tablet TAKE ONE TABLET BY MOUTH EVERY DAY 12/30/22   Babs Sciara, MD  fluticasone (FLONASE) 50 MCG/ACT nasal spray Place 2 sprays into both nostrils at bedtime. 08/27/22   Babs Sciara, MD  ipratropium-albuterol (DUONEB) 0.5-2.5 (3) MG/3ML SOLN Take 3 mLs by nebulization every 4 (four) hours as needed. 08/27/22   Babs Sciara, MD  JARDIANCE 10 MG TABS tablet TAKE ONE TABLET BY MOUTH ONCE DAILY BEFORE BREAKFAST 11/17/22   Jonelle Sidle, MD  methimazole (TAPAZOLE) 5 MG tablet TAKE ONE TABLET BY MOUTH EVERY DAY 12/21/22   Roma Kayser, MD  mirabegron ER (MYRBETRIQ) 50 MG TB24 tablet Take 50 mg by mouth daily.    [provider]  OXYGEN Inhale 2 L into the lungs continuous.    [provider]  oxymetazoline (AFRIN) 0.05 % nasal spray Place 1 spray into both nostrils at bedtime.    [provider]  polyethylene glycol (MIRALAX / GLYCOLAX) 17 g packet Take 17 g by mouth daily as needed.    [provider]  polyvinyl alcohol (LIQUIFILM TEARS) 1.4 % ophthalmic solution Place 1 drop into both eyes as needed for dry eyes.    [provider]  potassium chloride (KLOR-CON) 10 MEQ tablet TAKE 1 TABLET BY MOUTH TWICE DAILY 06/11/21   Babs Sciara, MD  pregabalin (LYRICA) 25 MG capsule Take 1 capsule (25 mg total) by mouth 2 (two) times daily. 01/12/23   Babs Sciara, MD  rOPINIRole (REQUIP) 2 MG tablet 1 q afternnon and 1 qhs 08/27/22   Luking, Scott A, MD   sacubitril-valsartan (ENTRESTO) 24-26 MG Take 1 tablet by mouth 2 (two) times daily. 12/17/22   Jonelle Sidle, MD  tamsulosin (FLOMAX) 0.4 MG CAPS capsule TAKE (1) CAPSULE BY MOUTH TWICE DAILY. 09/03/22   Babs Sciara, MD  torsemide (DEMADEX) 20 MG tablet Take 0.5 tablets (10 mg total) by mouth every other day. 01/06/23 01/06/24  Jonelle Sidle, MD  traMADol Janean Sark) 50 MG tablet One tid prn pain 11/16/22   Babs Sciara, MD  vitamin C (ASCORBIC ACID) 500 MG tablet Take 500 mg by mouth daily.    [provider]  Zinc Acetate, Oral, (ZINC ACETATE PO) Take 1 tablet by mouth daily.    [provider]      Allergies    Cephalexin and Penicillins    Review of Systems   Review of Systems  Constitutional:  Negative for fever.  HENT:  Negative for congestion and sore throat.   Eyes: Negative.   Respiratory:  Negative for chest tightness and shortness of breath.   Cardiovascular:  Negative for chest pain.  Gastrointestinal:  Negative for abdominal pain and nausea.  Genitourinary: Negative.   Musculoskeletal:  Positive for arthralgias. Negative for joint swelling and neck pain.  Skin: Negative.  Negative for rash and wound.  Neurological:  Negative for dizziness, weakness, light-headedness, numbness and headaches.  Psychiatric/Behavioral: Negative.    All other systems reviewed and are negative.   Physical Exam Updated Vital Signs BP (!) 171/96   Pulse 78   Temp 98.3 F (36.8 C) (Oral)   Resp 19   Ht 5\' 8"  (1.727 m)   Wt 90.7 kg   SpO2 97%   BMI 30.41 kg/m  Physical Exam Vitals and nursing note reviewed.  Constitutional:      Appearance: He is well-developed.  HENT:     Head: Normocephalic and atraumatic.  Eyes:     Conjunctiva/sclera: Conjunctivae normal.  Cardiovascular:     Rate and Rhythm: Normal rate and regular rhythm.     Heart sounds: Normal heart sounds.  Pulmonary:     Effort: Pulmonary effort is normal.     Breath sounds: Normal  breath sounds. No wheezing.  Abdominal:     General: Abdomen is protuberant. Bowel sounds are normal.     Palpations: Abdomen is soft.     Tenderness: There is no abdominal tenderness. There is no guarding.  Musculoskeletal:        General: Normal range of motion.     Cervical back: Normal range of motion.     Left hip: Deformity and bony tenderness present.     Comments: Tender to palpation around the left hip.  His foot is held in external rotation  and is left leg is shortened.  Dorsalis pedal pulses are present bilaterally.  He moves all other extremities without pain.  Skin:    General: Skin is warm and dry.  Neurological:     Mental Status: He is alert.     ED Results / Procedures / Treatments   Labs (all labs ordered are listed, but only abnormal results are displayed) Labs Reviewed  CBC WITH DIFFERENTIAL/PLATELET  COMPREHENSIVE METABOLIC PANEL    EKG None  Radiology DG Hip Unilat W or Wo Pelvis 2-3 Views Left  Result Date: 01/26/2023 CLINICAL DATA:  Fall with left hip pain EXAM: DG HIP (WITH OR WITHOUT PELVIS) 2-3V LEFT COMPARISON:  10/14/2015 FINDINGS: Right hip replacement. Pubic symphysis and rami appear intact. Acute left femoral neck fracture with superior displacement of the trochanter. No femoral head dislocation IMPRESSION: Acute displaced left femoral neck fracture. Electronically Signed   By: Jasmine Pang M.D.   On: 01/26/2023 17:43    Procedures Procedures    Medications Ordered in ED Medications  morphine (PF) 4 MG/ML injection 4 mg (4 mg Intravenous Given 01/26/23 1853)  ondansetron (ZOFRAN) injection 4 mg (4 mg Intravenous Given 01/26/23 1853)    ED Course/ Medical Decision Making/ A&P                             Medical Decision Making Amount and/or Complexity of Data Reviewed Labs: ordered.    Details: Labs for admission have been collected, Radiology: ordered.    Details: Left hip imaging reviewed, agree with interpretation, there is a displaced  left femoral neck fracture. Discussion of management or test interpretation with external provider(s): Patient was discussed with Dr. Dallas Schimke of Ortho, he will plan to see patient in the morning and if he is medically cleared for surgery may consider surgery tomorrow afternoon, he advised patient remain n.p.o. after midnight so that he would be prepared for surgery.  Call placed to the hospitalist for admission.  Risk Prescription drug management.           Final Clinical Impression(s) / ED Diagnoses Final diagnoses:  Closed fracture of left hip, initial encounter    Rx / DC Orders ED Discharge Orders     None         Victoriano Lain 01/26/23 2027    Lonell Grandchild, MD 01/27/23 2138

## 2023-01-26 NOTE — Assessment & Plan Note (Deleted)
Creatinine stable- 1.38. -Resume Gerald Hall

## 2023-01-27 ENCOUNTER — Inpatient Hospital Stay (HOSPITAL_COMMUNITY): Payer: Medicare Other

## 2023-01-27 ENCOUNTER — Other Ambulatory Visit: Payer: Self-pay

## 2023-01-27 ENCOUNTER — Encounter (HOSPITAL_COMMUNITY): Payer: Self-pay | Admitting: Internal Medicine

## 2023-01-27 ENCOUNTER — Ambulatory Visit: Payer: Medicare Other | Admitting: Family Medicine

## 2023-01-27 ENCOUNTER — Encounter (HOSPITAL_COMMUNITY)
Admission: EM | Disposition: A | Discharge status: Skilled Nursing Facility | Payer: Self-pay | Source: Home / Self Care | Attending: Internal Medicine

## 2023-01-27 DIAGNOSIS — I471 Supraventricular tachycardia, unspecified: Secondary | ICD-10-CM

## 2023-01-27 DIAGNOSIS — I5022 Chronic systolic (congestive) heart failure: Secondary | ICD-10-CM

## 2023-01-27 DIAGNOSIS — S72002A Fracture of unspecified part of neck of left femur, initial encounter for closed fracture: Secondary | ICD-10-CM

## 2023-01-27 DIAGNOSIS — F17210 Nicotine dependence, cigarettes, uncomplicated: Secondary | ICD-10-CM | POA: Diagnosis not present

## 2023-01-27 DIAGNOSIS — J449 Chronic obstructive pulmonary disease, unspecified: Secondary | ICD-10-CM

## 2023-01-27 DIAGNOSIS — I10 Essential (primary) hypertension: Secondary | ICD-10-CM | POA: Diagnosis not present

## 2023-01-27 DIAGNOSIS — R9431 Abnormal electrocardiogram [ECG] [EKG]: Secondary | ICD-10-CM

## 2023-01-27 DIAGNOSIS — Z01810 Encounter for preprocedural cardiovascular examination: Secondary | ICD-10-CM

## 2023-01-27 HISTORY — PX: HIP ARTHROPLASTY: SHX981

## 2023-01-27 LAB — BASIC METABOLIC PANEL
Anion gap: 8 (ref 5–15)
BUN: 24 mg/dL — ABNORMAL HIGH (ref 8–23)
CO2: 28 mmol/L (ref 22–32)
Calcium: 9.1 mg/dL (ref 8.9–10.3)
Chloride: 104 mmol/L (ref 98–111)
Creatinine, Ser: 1.32 mg/dL — ABNORMAL HIGH (ref 0.61–1.24)
GFR, Estimated: 53 mL/min — ABNORMAL LOW (ref >60)
Glucose, Bld: 107 mg/dL — ABNORMAL HIGH (ref 70–99)
Potassium: 4 mmol/L (ref 3.5–5.1)
Sodium: 140 mmol/L (ref 135–145)

## 2023-01-27 LAB — MAGNESIUM: Magnesium: 2.4 mg/dL (ref 1.7–2.4)

## 2023-01-27 LAB — CBC
HCT: 47.9 % (ref 39.0–52.0)
Hemoglobin: 15 g/dL (ref 13.0–17.0)
MCH: 31.1 pg (ref 26.0–34.0)
MCHC: 31.3 g/dL (ref 30.0–36.0)
MCV: 99.4 fL (ref 80.0–100.0)
Platelets: 199 10*3/uL (ref 150–400)
RBC: 4.82 MIL/uL (ref 4.22–5.81)
RDW: 14.9 % (ref 11.5–15.5)
WBC: 10.9 10*3/uL — ABNORMAL HIGH (ref 4.0–10.5)
nRBC: 0 % (ref 0.0–0.2)

## 2023-01-27 SURGERY — HEMIARTHROPLASTY, HIP, DIRECT ANTERIOR APPROACH, FOR FRACTURE
Anesthesia: General | Site: Hip | Laterality: Left

## 2023-01-27 MED ORDER — FENTANYL CITRATE (PF) 250 MCG/5ML IJ SOLN
INTRAMUSCULAR | Status: DC | PRN
  Administered 2023-01-27: 50 ug via INTRAVENOUS
  Administered 2023-01-27 (×2): 25 ug via INTRAVENOUS

## 2023-01-27 MED ORDER — GLYCOPYRROLATE PF 0.2 MG/ML IJ SOSY
PREFILLED_SYRINGE | INTRAMUSCULAR | Status: DC | PRN
  Administered 2023-01-27: .2 mg via INTRAVENOUS

## 2023-01-27 MED ORDER — CEFAZOLIN SODIUM-DEXTROSE 2-4 GM/100ML-% IV SOLN
2.0000 g | Freq: Three times a day (TID) | INTRAVENOUS | Status: AC
  Administered 2023-01-27: 2 g via INTRAVENOUS
  Filled 2023-01-27 (×2): qty 100

## 2023-01-27 MED ORDER — SODIUM CHLORIDE 0.9 % IV SOLN: INTRAVENOUS | Status: DC

## 2023-01-27 MED ORDER — ORAL CARE MOUTH RINSE: 15.0000 mL | Freq: Once | OROMUCOSAL | Status: AC

## 2023-01-27 MED ORDER — BUDESONIDE 0.25 MG/2ML IN SUSP
0.2500 mg | Freq: Two times a day (BID) | RESPIRATORY_TRACT | Status: DC
  Administered 2023-01-27 – 2023-02-04 (×15): 0.25 mg via RESPIRATORY_TRACT
  Filled 2023-01-27 (×16): qty 2

## 2023-01-27 MED ORDER — ONDANSETRON HCL 4 MG/2ML IJ SOLN: 4.0000 mg | Freq: Once | INTRAMUSCULAR | Status: DC | PRN

## 2023-01-27 MED ORDER — FENTANYL CITRATE PF 50 MCG/ML IJ SOSY: 25.0000 ug | PREFILLED_SYRINGE | INTRAMUSCULAR | Status: DC | PRN

## 2023-01-27 MED ORDER — ROCURONIUM BROMIDE 10 MG/ML (PF) SYRINGE
PREFILLED_SYRINGE | INTRAVENOUS | Status: DC | PRN
  Administered 2023-01-27: 60 mg via INTRAVENOUS
  Administered 2023-01-27: 20 mg via INTRAVENOUS

## 2023-01-27 MED ORDER — MAGNESIUM SULFATE 2 GM/50ML IV SOLN
2.0000 g | Freq: Once | INTRAVENOUS | Status: AC
  Administered 2023-01-27: 2 g via INTRAVENOUS
  Filled 2023-01-27: qty 50

## 2023-01-27 MED ORDER — SODIUM CHLORIDE 0.9 % IR SOLN
Status: DC | PRN
  Administered 2023-01-27: 3000 mL

## 2023-01-27 MED ORDER — IPRATROPIUM-ALBUTEROL 0.5-2.5 (3) MG/3ML IN SOLN
RESPIRATORY_TRACT | Status: AC
  Filled 2023-01-27: qty 3

## 2023-01-27 MED ORDER — PHENYLEPHRINE HCL-NACL 20-0.9 MG/250ML-% IV SOLN
INTRAVENOUS | Status: DC | PRN
  Administered 2023-01-27: 30 ug/min via INTRAVENOUS
  Administered 2023-01-27: 50 ug/min via INTRAVENOUS

## 2023-01-27 MED ORDER — PHENYLEPHRINE HCL-NACL 20-0.9 MG/250ML-% IV SOLN
INTRAVENOUS | Status: AC
  Filled 2023-01-27: qty 250

## 2023-01-27 MED ORDER — BUPIVACAINE-EPINEPHRINE (PF) 0.5% -1:200000 IJ SOLN
INTRAMUSCULAR | Status: AC
  Filled 2023-01-27: qty 30

## 2023-01-27 MED ORDER — LIDOCAINE 2% (20 MG/ML) 5 ML SYRINGE
INTRAMUSCULAR | Status: DC | PRN
  Administered 2023-01-27: 40 mg via INTRAVENOUS
  Administered 2023-01-27 (×2): 50 mg via INTRAVENOUS
  Administered 2023-01-27: 100 mg via INTRAVENOUS

## 2023-01-27 MED ORDER — LACTATED RINGERS IV SOLN: INTRAVENOUS | Status: DC

## 2023-01-27 MED ORDER — PHENYLEPHRINE 80 MCG/ML (10ML) SYRINGE FOR IV PUSH (FOR BLOOD PRESSURE SUPPORT)
PREFILLED_SYRINGE | INTRAVENOUS | Status: DC | PRN
  Administered 2023-01-27: 160 ug via INTRAVENOUS
  Administered 2023-01-27: 80 ug via INTRAVENOUS
  Administered 2023-01-27: 160 ug via INTRAVENOUS
  Administered 2023-01-27: 80 ug via INTRAVENOUS
  Administered 2023-01-27 (×2): 160 ug via INTRAVENOUS
  Administered 2023-01-27: 100 ug via INTRAVENOUS
  Administered 2023-01-27: 80 ug via INTRAVENOUS
  Administered 2023-01-27: 160 ug via INTRAVENOUS

## 2023-01-27 MED ORDER — IPRATROPIUM-ALBUTEROL 0.5-2.5 (3) MG/3ML IN SOLN
3.0000 mL | RESPIRATORY_TRACT | Status: DC
  Administered 2023-01-27: 3 mL via RESPIRATORY_TRACT

## 2023-01-27 MED ORDER — 0.9 % SODIUM CHLORIDE (POUR BTL) OPTIME
TOPICAL | Status: DC | PRN
  Administered 2023-01-27: 1000 mL

## 2023-01-27 MED ORDER — FUROSEMIDE 10 MG/ML IJ SOLN
80.0000 mg | Freq: Once | INTRAMUSCULAR | Status: AC
  Administered 2023-01-27: 80 mg via INTRAVENOUS
  Filled 2023-01-27: qty 8

## 2023-01-27 MED ORDER — PROPOFOL 10 MG/ML IV BOLUS
INTRAVENOUS | Status: DC | PRN
  Administered 2023-01-27: 50 mg via INTRAVENOUS

## 2023-01-27 MED ORDER — LIDOCAINE HCL (PF) 2 % IJ SOLN
INTRAMUSCULAR | Status: AC
  Filled 2023-01-27: qty 5

## 2023-01-27 MED ORDER — CHLORHEXIDINE GLUCONATE 0.12 % MT SOLN: 15.0000 mL | Freq: Once | OROMUCOSAL | Status: AC

## 2023-01-27 MED ORDER — SUGAMMADEX SODIUM 200 MG/2ML IV SOLN
INTRAVENOUS | Status: DC | PRN
  Administered 2023-01-27: 200 mg via INTRAVENOUS

## 2023-01-27 MED ORDER — BUPIVACAINE-EPINEPHRINE (PF) 0.5% -1:200000 IJ SOLN
INTRAMUSCULAR | Status: DC | PRN
  Administered 2023-01-27: 30 mL

## 2023-01-27 MED ORDER — GLYCOPYRROLATE PF 0.2 MG/ML IJ SOSY
PREFILLED_SYRINGE | INTRAMUSCULAR | Status: AC
  Filled 2023-01-27: qty 1

## 2023-01-27 MED ORDER — ETOMIDATE 2 MG/ML IV SOLN
INTRAVENOUS | Status: DC | PRN
  Administered 2023-01-27: 10 mg via INTRAVENOUS

## 2023-01-27 MED ORDER — VANCOMYCIN HCL 1000 MG IV SOLR
INTRAVENOUS | Status: AC
  Filled 2023-01-27: qty 20

## 2023-01-27 MED ORDER — AMIODARONE HCL 200 MG PO TABS: 100.0000 mg | ORAL_TABLET | Freq: Every day | ORAL | Status: DC

## 2023-01-27 MED ORDER — MIDAZOLAM HCL 2 MG/2ML IJ SOLN
INTRAMUSCULAR | Status: AC
  Filled 2023-01-27: qty 2

## 2023-01-27 MED ORDER — CHLORHEXIDINE GLUCONATE 0.12 % MT SOLN
OROMUCOSAL | Status: AC
  Administered 2023-01-27: 15 mL via OROMUCOSAL
  Filled 2023-01-27: qty 15

## 2023-01-27 MED ORDER — DEXAMETHASONE SODIUM PHOSPHATE 4 MG/ML IJ SOLN
INTRAMUSCULAR | Status: DC | PRN
  Administered 2023-01-27: 5 mg via INTRAVENOUS

## 2023-01-27 MED ORDER — PHENYLEPHRINE 80 MCG/ML (10ML) SYRINGE FOR IV PUSH (FOR BLOOD PRESSURE SUPPORT)
PREFILLED_SYRINGE | INTRAVENOUS | Status: AC
  Filled 2023-01-27: qty 20

## 2023-01-27 MED ORDER — FENTANYL CITRATE (PF) 100 MCG/2ML IJ SOLN
INTRAMUSCULAR | Status: AC
  Filled 2023-01-27: qty 2

## 2023-01-27 MED ORDER — VANCOMYCIN HCL 1 G IV SOLR
INTRAVENOUS | Status: DC | PRN
  Administered 2023-01-27: 1000 mg

## 2023-01-27 SURGICAL SUPPLY — 57 items
APL PRP STRL LF DISP 70% ISPRP (MISCELLANEOUS) ×2
BIPOLAR DEPUY 49 (Hips) ×1 IMPLANT
BIT DRILL 2.8X128 (BIT) ×1 IMPLANT
BLADE SAGITTAL 25.0X1.27X90 (BLADE) ×1 IMPLANT
CHLORAPREP W/TINT 26 (MISCELLANEOUS) ×1 IMPLANT
CLOTH BEACON ORANGE TIMEOUT ST (SAFETY) ×1 IMPLANT
COVER LIGHT HANDLE STERIS (MISCELLANEOUS) ×2 IMPLANT
DECANTER SPIKE VIAL GLASS SM (MISCELLANEOUS) ×1 IMPLANT
DRAPE HIP W/POCKET STRL (MISCELLANEOUS) ×1 IMPLANT
DRAPE SURG 17X23 STRL (DRAPES) ×1 IMPLANT
DRAPE U-SHAPE 47X51 STRL (DRAPES) ×1 IMPLANT
DRESSING AQUACEL AG ADV 3.5X12 (MISCELLANEOUS) ×1 IMPLANT
DRSG AQUACEL AG ADV 3.5X10 (GAUZE/BANDAGES/DRESSINGS) IMPLANT
DRSG AQUACEL AG ADV 3.5X12 (MISCELLANEOUS) ×1
DRSG MEPILEX SACRM 8.7X9.8 (GAUZE/BANDAGES/DRESSINGS) ×1 IMPLANT
ELECT REM PT RETURN 9FT ADLT (ELECTROSURGICAL) ×1
ELECTRODE REM PT RTRN 9FT ADLT (ELECTROSURGICAL) ×1 IMPLANT
GLOVE BIO SURGEON STRL SZ7 (GLOVE) IMPLANT
GLOVE BIO SURGEON STRL SZ8 (GLOVE) ×4 IMPLANT
GLOVE BIOGEL PI IND STRL 7.0 (GLOVE) ×2 IMPLANT
GLOVE ECLIPSE 6.5 STRL STRAW (GLOVE) IMPLANT
GLOVE SRG 8 PF TXTR STRL LF DI (GLOVE) ×1 IMPLANT
GLOVE SURG UNDER POLY LF SZ8 (GLOVE) ×1
GOWN STRL REUS W/ TWL XL LVL3 (GOWN DISPOSABLE) ×1 IMPLANT
GOWN STRL REUS W/TWL LRG LVL3 (GOWN DISPOSABLE) ×2 IMPLANT
GOWN STRL REUS W/TWL XL LVL3 (GOWN DISPOSABLE) ×1
HANDPIECE INTERPULSE COAX TIP (DISPOSABLE) ×1
HEAD BIPOLAR DEPUY 49 (Hips) IMPLANT
HEAD FEM STD 28X+1.5 STRL (Hips) IMPLANT
INST SET MAJOR BONE (KITS) ×1 IMPLANT
IV NS IRRIG 3000ML ARTHROMATIC (IV SOLUTION) ×1 IMPLANT
KIT BLADEGUARD II DBL (SET/KITS/TRAYS/PACK) ×1 IMPLANT
KIT TURNOVER KIT A (KITS) ×1 IMPLANT
MANIFOLD NEPTUNE II (INSTRUMENTS) ×1 IMPLANT
MARKER SKIN DUAL TIP RULER LAB (MISCELLANEOUS) ×1 IMPLANT
NDL HYPO 18GX1.5 BLUNT FILL (NEEDLE) ×1 IMPLANT
NDL HYPO 21X1.5 SAFETY (NEEDLE) ×1 IMPLANT
NEEDLE HYPO 18GX1.5 BLUNT FILL (NEEDLE) ×1 IMPLANT
NEEDLE HYPO 21X1.5 SAFETY (NEEDLE) ×1 IMPLANT
NS IRRIG 1000ML POUR BTL (IV SOLUTION) ×1 IMPLANT
PACK SURGICAL SETUP 50X90 (CUSTOM PROCEDURE TRAY) ×1 IMPLANT
PACK TOTAL JOINT (CUSTOM PROCEDURE TRAY) ×1 IMPLANT
PAD ARMBOARD 7.5X6 YLW CONV (MISCELLANEOUS) ×1 IMPLANT
SET BASIN LINEN APH (SET/KITS/TRAYS/PACK) ×1 IMPLANT
SET HNDPC FAN SPRY TIP SCT (DISPOSABLE) ×1 IMPLANT
STAPLER VISISTAT (STAPLE) IMPLANT
STEM SUMMIT PRESSFIT SZ 6 (Hips) IMPLANT
SUT MNCRL AB 4-0 PS2 18 (SUTURE) ×2 IMPLANT
SUT MON AB 2-0 CT1 36 (SUTURE) ×1 IMPLANT
SUT VIC AB 1 CT1 27 (SUTURE) ×6
SUT VIC AB 1 CT1 27XBRD ANTBC (SUTURE) ×4 IMPLANT
SYR 30ML LL (SYRINGE) ×2 IMPLANT
SYR BULB IRRIG 60ML STRL (SYRINGE) ×2 IMPLANT
TOWER CARTRIDGE SMART MIX (DISPOSABLE) IMPLANT
TRAY FOLEY MTR SLVR 16FR STAT (SET/KITS/TRAYS/PACK) ×1 IMPLANT
WATER STERILE IRR 1000ML POUR (IV SOLUTION) ×2 IMPLANT
YANKAUER SUCT 12FT TUBE ARGYLE (SUCTIONS) ×1 IMPLANT

## 2023-01-27 NOTE — TOC Initial Note (Signed)
Transition of Care [facility]) - Initial/Assessment Note    Patient Details  Name: Gerald Hall MRN: [id number] Date of Birth: 03/20/1935  [facility]) CM/SW Contact:    Salome Arnt, LCSW Phone Number: 01/27/2023, 10:30 AM  Clinical Narrative:  Pt admitted due to hip fracture. Assessment completed with pt's daughter, Judson Roch as pt deferred to her. Judson Roch reports pt has a live in caregiver. He ambulates with a walker at baseline. Pt is on 2L home O2 ([employer]). Discussed with Judson Roch that pt will likely need SNF at d/c. She would like to see how pt does with PT after surgery. TOC will follow.                  Expected Discharge Plan: Skilled Nursing Facility Barriers to Discharge: Continued Medical Work up   Patient Goals and CMS Choice Patient states their goals for this hospitalization and ongoing recovery are:: unsure at this time   Choice offered to / list presented to : Adult Children      Expected Discharge Plan and Services In-house Referral: Clinical Social Work     Living arrangements for the past 2 months: Single Family Home                                      Prior Living Arrangements/Services Living arrangements for the past 2 months: Single Family Home Lives with:: Other (Comment) (caregiver) Patient language and need for interpreter reviewed:: Yes Do you feel safe going back to the place where you live?: Yes      Need for Family Participation in Patient Care: Yes (Comment) Care giver support system in place?: Yes (comment) Current home services: DME (walker, home O2) Criminal Activity/Legal Involvement Pertinent to Current Situation/Hospitalization: No - Comment as needed  Activities of Daily Living Home Assistive Devices/Equipment: Hearing aid, Oxygen, Wheelchair, Environmental consultant (specify type) ADL Screening (condition at time of admission) Patient's cognitive ability adequate to safely complete daily activities?: Yes Is the patient deaf or  have difficulty hearing?: Yes Does the patient have difficulty seeing, even when wearing glasses/contacts?: No Does the patient have difficulty concentrating, remembering, or making decisions?: No Patient able to express need for assistance with ADLs?: Yes Does the patient have difficulty dressing or bathing?: Yes Independently performs ADLs?: No Communication: Independent Dressing (OT): Needs assistance Is this a change from baseline?: Change from baseline, expected to last >3 days Grooming: Needs assistance Is this a change from baseline?: Change from baseline, expected to last >3 days Feeding: Independent Bathing: Needs assistance Is this a change from baseline?: Change from baseline, expected to last >3 days Toileting: Needs assistance Is this a change from baseline?: Change from baseline, expected to last >3days In/Out Bed: Needs assistance Is this a change from baseline?: Change from baseline, expected to last >3 days Walks in Home: Needs assistance Is this a change from baseline?: Change from baseline, expected to last >3 days Does the patient have difficulty walking or climbing stairs?: Yes Weakness of Legs: Both Weakness of Arms/Hands: None  Permission Sought/Granted                  Emotional Assessment       Orientation: : Oriented to Self, Oriented to Place, Oriented to  Time, Oriented to Situation Alcohol / Substance Use: Not Applicable Psych Involvement: No (comment)  Admission diagnosis:  Closed fracture of left hip, initial encounter [S72.002A]  Closed displaced fracture of left femoral neck [S72.002A] Patient Active Problem List   Diagnosis Date Noted   Closed displaced fracture of left femoral neck 01/26/2023   CKD stage 3a, GFR 45-59 ml/min 01/26/2023   Chronic respiratory failure with hypoxia 01/26/2023   Reduced ejection fraction concurrent with and due to chronic heart failure 06/23/2022   Aortic atherosclerosis 06/23/2022   Hyperthyroidism  03/04/2022   COPD (chronic obstructive pulmonary disease) 11/22/2021   COPD with acute exacerbation 11/20/2021   Hypokalemia 11/20/2021   Acute left-sided low back pain without sciatica 10/15/2021   Centrilobular emphysema 11/12/2020   Secondary cardiomyopathy 11/12/2020   Subacute thyroiditis 09/25/2020   Brow ptosis, bilateral 07/03/2020   Dermatochalasis of both upper eyelids 07/03/2020   Ptosis, both eyelids 07/03/2020   Impacted cerumen of right ear 03/06/2020   Sensorineural hearing loss (SNHL) of right ear with restricted hearing of left ear 03/06/2020   Chronic atrial fibrillation, unspecified 02/24/2019   Primary osteoarthritis involving multiple joints 02/21/2017   Current smoker 02/21/2017   AMD (age-related macular degeneration), bilateral 11/10/2016   Insomnia 07/20/2016   Restless legs 07/20/2016   Spinal stenosis in cervical region 10/14/2015   Senile purpura 04/18/2015   Osteopenia 01/28/2015   Spinal stenosis of lumbar region 01/20/2015   BPH (benign prostatic hyperplasia) 01/20/2015   HTN (hypertension), benign 04/17/2014   Lumbar disc disease 03/05/2014   Basal cell carcinoma of face 11/08/2011   PCP:  Kathyrn Drown, MD Pharmacy:   [place], [place] - [address] [place] [address] Phone: [phone] Fax: [phone]     Social Determinants of Health (SDOH) Social History: SDOH Screenings   Food Insecurity: No Food Insecurity (01/26/2023)  Housing: Low Risk  (01/26/2023)  Transportation Needs: No Transportation Needs (01/26/2023)  Utilities: Not At Risk (01/26/2023)  Alcohol Screen: Low Risk  (03/16/2022)  Depression (PHQ2-9): Low Risk  (01/12/2023)  Financial Resource Strain: Low Risk  (03/16/2022)  Physical Activity: Sufficiently Active (03/16/2022)  Social Connections: Moderately Integrated (03/16/2022)  Stress: No Stress Concern Present (03/16/2022)  Tobacco Use: High Risk (01/26/2023)   SDOH  Interventions:     Readmission Risk Interventions     No data to display

## 2023-01-27 NOTE — Progress Notes (Addendum)
  Amiodarone Drug - Drug Interaction Consult Note   *Coreg: increased risk of bradycaria or heart block  FOR QT prolongation: Dulera(formoterol component)  does have some fair risk of QT interval prolongation with combination. Especially, if hypokalemic.  Ondansetron may increase risk of QT interval prolongation. 2 doses received 4/3  Otherwise, do not see any other agents on current medication list  that may prolong QT interval K 4.0 and Mag 2.4, WNL range  Amiodarone is metabolized by the cytochrome P450 system and therefore has the potential to cause many drug interactions. Amiodarone has an average plasma half-life of 50 days (range 20 to 100 days).   There is potential for drug interactions to occur several weeks or months after stopping treatment and the onset of drug interactions may be slow after initiating amiodarone.   []  Statins: Increased risk of myopathy. Simvastatin- restrict dose to 20mg  daily. Other statins: counsel patients to report any muscle pain or weakness immediately.  []  Anticoagulants: Amiodarone can increase anticoagulant effect. Consider warfarin dose reduction. Patients should be monitored closely and the dose of anticoagulant altered accordingly, remembering that amiodarone levels take several weeks to stabilize.  []  Antiepileptics: Amiodarone can increase plasma concentration of phenytoin, the dose should be reduced. Note that small changes in phenytoin dose can result in large changes in levels. Monitor patient and counsel on signs of toxicity.  []  Beta blockers: increased risk of bradycardia, AV block and myocardial depression. Sotalol - avoid concomitant use.  []   Calcium channel blockers (diltiazem and verapamil): increased risk of bradycardia, AV block and myocardial depression.  []   Cyclosporine: Amiodarone increases levels of cyclosporine. Reduced dose of cyclosporine is recommended.  []  Digoxin dose should be halved when amiodarone is  started.  []  Diuretics: increased risk of cardiotoxicity if hypokalemia occurs.  []  Oral hypoglycemic agents (glyburide, glipizide, glimepiride): increased risk of hypoglycemia. Patient's glucose levels should be monitored closely when initiating amiodarone therapy.   [x]  Drugs that prolong the QT interval:  Torsades de pointes risk may be increased with concurrent use - avoid if possible.  Monitor QTc, also keep magnesium/potassium WNL if concurrent therapy can't be avoided.  Antibiotics: e.g. fluoroquinolones, erythromycin.  Antiarrhythmics: e.g. quinidine, procainamide, disopyramide, sotalol.  Antipsychotics: e.g. phenothiazines, haloperidol.   Lithium, tricyclic antidepressants, and methadone.   Thank You,  Trenton Gammon, Jeidy Hoerner L  01/27/2023 10:09 AM

## 2023-01-27 NOTE — Progress Notes (Signed)
Bladder scan greater than 380. MD notified. Ordered to place foley.

## 2023-01-27 NOTE — Progress Notes (Signed)
Gerald Hall  [id number] DOB: 02-Jul-1935 DOA: 01/26/2023 PCP: Kathyrn Drown, MD   Brief Narrative:    DMIR RIEF is a 88 y.o. male with medical history significant for BPH, atria fib, COPD with chronic respiratory failure, CKD3.  Patient was brought to the ED with reports of a fall and subsequent left hip pain.  At baseline patient ambulates with a walker.  Patient was admitted with closed displaced fracture of left femoral neck and orthopedics to see.  Cardiology consult for clearance completed.  Assessment & Plan:   Principal Problem:   Closed displaced fracture of left femoral neck Active Problems:   Secondary cardiomyopathy   Chronic atrial fibrillation, unspecified   COPD (chronic obstructive pulmonary disease)   HTN (hypertension), benign   Hyperthyroidism   CKD stage 3a, GFR 45-59 ml/min   Chronic respiratory failure with hypoxia  Assessment and Plan:   Closed displaced fracture of left femoral neck Status post mechanical fall.  Pelvic x-ray shows acute displaced left femoral neck fracture. -Orthopedics for ORIF this afternoon -IV morphine 2 mg every 4 hourly as needed -Appreciate cardiology consultation for clearance   Secondary cardiomyopathy Chronic bilateral 1+ pitting edema, unchanged.  Last echo shows EF of 30 to 35%, with grade 1 DD. -Resume Entresto, Coreg, Jardiance,  -Holding torsemide while NPO -BNP 253 -Chest x-ray with no acute findings   COPD (chronic obstructive pulmonary disease) Diffuse wheezing on exam, no cough, denies dyspnea, reports he missed 2 doses of his bronchodilators.  On 2 L chronically. -Resume DuoNebs, and home regimen   Chronic atrial fibrillation, unspecified Sinus rhythm, rate Controlled on amiodarone and carvedilol.  Not on anticoagulation.  Follows with Dr. Conni Elliot.  QTc prolonged at 536, also with first-degree heart block with PR interval of 345-  Not new. -Resume amiodarone, carvedilol   CKD  stage 3a, GFR 45-59 ml/min Creatinine stable- 1.38. -Resume Entresto   Hyperthyroidism Resume methimazole   HTN (hypertension), benign Systolic 0000000 to [id number]. -Resume carvedilol, tamsulosin   Chronic respiratory failure with hypoxia On 2 L.  Obesity BMI 30.77    DVT prophylaxis:SCDs Code Status: DNR Family Communication: Daughter at bedside 4/4 Disposition Plan: Admit for ORIF Status is: Inpatient Remains inpatient appropriate because: Need for IV medications  Consultants:  Orthopedics Cardiology  Procedures:  None  Antimicrobials:  Anti-infectives (From admission, onward)    Start     Dose/Rate Route Frequency Ordered Stop   01/27/23 1200  ceFAZolin (ANCEF) IVPB 2g/100 mL premix        2 g 200 mL/hr over 30 Minutes Intravenous On call to O.R. 01/26/23 1951 01/28/23 0559       Subjective: Patient seen and evaluated today with no new acute complaints or concerns. No acute concerns or events noted overnight.  He states that his hip pain is well-controlled.  Objective: Vitals:   01/26/23 2250 01/26/23 2252 01/27/23 0033 01/27/23 0447  BP:   (!) 150/82 114/63  Pulse:   86 87  Resp:   16 18  Temp:   98.8 F (37.1 C) 98 F (36.7 C)  TempSrc:      SpO2: 95% 95% 96% 94%  Weight:      Height:        Intake/Output Summary (Last 24 hours) at 01/27/2023 0645 Last data filed at 01/27/2023 0525 Gross per 24 hour  Intake --  Output 750 ml  Net -750 ml   Filed Weights   01/26/23 1709 01/26/23 2134  Weight: 90.7 kg 91.8 kg    Examination:  General exam: Appears calm and comfortable  Respiratory system: Clear to auscultation. Respiratory effort normal.  2 L nasal cannula Cardiovascular system: S1 & S2 heard, RRR.  Gastrointestinal system: Abdomen is soft Central nervous system: Alert and awake Extremities: No edema Skin: No significant lesions noted Psychiatry: Flat affect.    Data Reviewed: I have personally reviewed following labs and imaging  studies  CBC: Recent Labs  Lab 01/26/23 1823 01/27/23 0516  WBC 13.2* 10.9*  NEUTROABS 10.0*  --   HGB 16.0 15.0  HCT 50.3 47.9  MCV 98.6 99.4  PLT 202 [id number]   Basic Metabolic Panel: Recent Labs  Lab 01/26/23 1823 01/27/23 0516  NA 137 140  K 3.9 4.0  CL 102 104  CO2 26 28  GLUCOSE 127* 107*  BUN 29* 24*  CREATININE 1.38* 1.32*  CALCIUM 9.3 9.1   GFR: Estimated Creatinine Clearance: 45 mL/min (A) (by C-G formula based on SCr of 1.32 mg/dL (H)). Liver Function Tests: Recent Labs  Lab 01/26/23 1823  AST 16  ALT 16  ALKPHOS 95  BILITOT 0.6  PROT 7.3  ALBUMIN 3.8   No results for input(s): "LIPASE", "AMYLASE" in the last 168 hours. No results for input(s): "AMMONIA" in the last 168 hours. Coagulation Profile: No results for input(s): "INR", "PROTIME" in the last 168 hours. Cardiac Enzymes: No results for input(s): "CKTOTAL", "CKMB", "CKMBINDEX", "TROPONINI" in the last 168 hours. BNP (last 3 results) No results for input(s): "PROBNP" in the last 8760 hours. HbA1C: No results for input(s): "HGBA1C" in the last 72 hours. CBG: No results for input(s): "GLUCAP" in the last 168 hours. Lipid Profile: No results for input(s): "CHOL", "HDL", "LDLCALC", "TRIG", "CHOLHDL", "LDLDIRECT" in the last 72 hours. Thyroid Function Tests: No results for input(s): "TSH", "T4TOTAL", "FREET4", "T3FREE", "THYROIDAB" in the last 72 hours. Anemia Panel: No results for input(s): "VITAMINB12", "FOLATE", "FERRITIN", "TIBC", "IRON", "RETICCTPCT" in the last 72 hours. Sepsis Labs: No results for input(s): "PROCALCITON", "LATICACIDVEN" in the last 168 hours.  Recent Results (from the past 240 hour(s))  Surgical pcr screen     Status: None   Collection Time: 01/26/23  9:50 PM   Specimen: Nasal Mucosa; Nasal Swab  Result Value Ref Range Status   MRSA, PCR NEGATIVE NEGATIVE Final   Staphylococcus aureus NEGATIVE NEGATIVE Final    Comment: (NOTE) The Xpert SA Assay (FDA approved for  NASAL specimens in patients 38 years of age and older), is one component of a comprehensive surveillance program. It is not intended to diagnose infection nor to guide or monitor treatment. Performed at [facility], [address], [place], [place] [address]          Radiology Studies: DG CHEST PORT 1 VIEW  Result Date: 01/26/2023 CLINICAL DATA:  [id number], preoperative chest x-ray for left femoral neck fracture fixation. EXAM: PORTABLE CHEST 1 VIEW COMPARISON:  Chest CT no contrast 11/04/2022 FINDINGS: The lungs are clear of infiltrates. The sulci are sharp. Stable mediastinum with mild aortic calcification and tortuosity. There is mild cardiomegaly without CHF. C5-6 ACDF plating is again shown. Moderate thoracic spondylosis. IMPRESSION: 1. No acute cardiopulmonary findings. 2. Mild cardiomegaly. 3. Aortic atherosclerosis. Electronically Signed   By: Telford Nab M.D.   On: 01/26/2023 22:12   DG Hip Unilat W or Wo Pelvis 2-3 Views Left  Result Date: 01/26/2023 CLINICAL DATA:  Fall with left hip pain EXAM: DG HIP (WITH OR WITHOUT PELVIS) 2-3V LEFT COMPARISON:  10/14/2015 FINDINGS: Right hip replacement. Pubic symphysis and rami appear intact. Acute left femoral neck fracture with superior displacement of the trochanter. No femoral head dislocation IMPRESSION: Acute displaced left femoral neck fracture. Electronically Signed   By: Donavan Foil M.D.   On: 01/26/2023 17:43        Scheduled Meds:  amiodarone  200 mg Oral Daily   carvedilol  3.125 mg Oral BID WC   finasteride  5 mg Oral Daily   ipratropium-albuterol  3 mL Nebulization Q8H   methimazole  5 mg Oral Daily   mometasone-formoterol  2 puff Inhalation BID   sacubitril-valsartan  1 tablet Oral BID   torsemide  10 mg Oral QODAY   Continuous Infusions:   ceFAZolin (ANCEF) IV     tranexamic acid       LOS: 1 day    Time spent: 35 minutes    Dillian Feig Darleen Crocker, DO [facility]  If 7PM-7AM, please contact  night-coverage [email or web address] 01/27/2023, 6:45 AM

## 2023-01-27 NOTE — Progress Notes (Signed)
Patient admitted to floor from ER, alert and oriented x4. Oriented to room, call bell within reach, bed alarm on. Patient daughter at bedside.

## 2023-01-27 NOTE — Consult Note (Addendum)
Cardiology Consultation   Patient ID: Gerald Hall MRN: [id number]; DOB: 04-25-1935  Admit date: 01/26/2023 Date of Consult: 01/27/2023  PCP:  Kathyrn Drown, MD   [place] [facility] Cardiologist:  Rozann Lesches, MD        Patient Profile:   Gerald Hall is a 88 y.o. male with a hx of HFrEF (EF 45-50% in 02/2019, at 40-45% in 12/2021 and 30-35% in 11/2022), pSVT, COPD (on 2L [employer] at baseline), HTN, HLD, hyperthyroidism and temporal arteritis who is being seen 01/27/2023 for the evaluation of preoperative cardiac clearance at the request of Dr. Manuella Ghazi.  History of Present Illness:   Gerald Hall was recently examined by Dr. Domenic Polite in 12/2022 and reported NYHA class II-III dyspnea but denied any chest pain. Did report some episodes of elevated heart rate, therefore it was recommended to increase Amiodarone to 200 mg daily. In regards to his cardiomyopathy, he was continued on Coreg, Entresto, Jardiance and Torsemide 10 mg every other day but was not started on an MRA given fluctuating renal function.  He presented to [facility] ED on 01/26/2023 after having a mechanical fall at home. Imaging showed an acute displaced left femoral neck fracture and he is planning to undergo Hemiarthroplasty later today.  Initial labs showed WBC 13.2, Hgb 16.0, platelets 202, Na+ 137, K+ 3.9 and creatinine 1.38 (baseline 1.5 - 1.6). BNP 253. CXR showed mild cardiomegaly and aortic atherosclerosis with no acute cardiopulmonary findings. EKG shows NSR, HR 75 with 1st degree AV block and IVCD with calculated QTc at 536 ms and corrected very similar at 537 ms.   In talking with the patient and his daughter who is at the bedside, he reports he had overall been feeling well prior to his recent fall. He ambulates with a walker at baseline and is on 2 L nasal cannula (has been on supplemental oxygen for at least 1 year). Denies any specific orthopnea or PND. He does have intermittent lower extremity edema and  takes Torsemide 10 mg daily as he experienced significant weakness and frequent urination with taking 20 mg daily. He denies any recent chest pain or palpitations. Says he was overall asymptomatic with his SVT in the past. Reports he fell yesterday while trying to pick up a Kleenex. No associated dizziness or presyncope.   Past Medical History:  Diagnosis Date   Aortic atherosclerosis 06/23/2022   Seen on CAT scan   Arthritis    Balance problem    Uses a walker   Blood in urine    Sees urology yearly   Deafness in right ear age 80   Hyperlipidemia    Hypertension    PSVT (paroxysmal supraventricular tachycardia)    Reduced ejection fraction concurrent with and due to chronic heart failure 06/23/2022   See echo spring 2023 patient with DOE and some pedal edema has chronic kidney disease stage IIIb   Temporal arteritis 2016   Urinary incontinence     Past Surgical History:  Procedure Laterality Date   BACK SURGERY  2010   [place]     CATARACT EXTRACTION Bilateral 07/30/14, 08/13/2014   CERVICAL SPINE SURGERY  09/01/15   C 4-5 , [place]   COLONOSCOPY  12/19/2007   RMR: 1. External hemorrohids, otherwise normal rectum.2. Normal colon. 3. Normal terminal ileum.   COLONOSCOPY N/A 12/13/2014   Procedure: COLONOSCOPY;  Surgeon: Daneil Dolin, MD;  Location: [facility];  Service: Endoscopy;  Laterality: N/A;  11:15 Pt Request Time   KNEE SURGERY Left    around 2000, arthroscopic   TOTAL HIP ARTHROPLASTY Right 02/23/2017   Procedure: TOTAL HIP ARTHROPLASTY ANTERIOR APPROACH;  Surgeon: Hessie Knows, MD;  Location: [facility] ORS;  Service: Orthopedics;  Laterality: Right;     Home Medications:  Prior to Admission medications   Medication Sig Start Date End Date Taking? Authorizing Provider  senna-docusate (SENOKOT-S) 8.6-50 MG tablet Take by mouth. 03/06/20  Yes [provider]  acetaminophen (TYLENOL) 325 MG tablet Take 1,000 mg by mouth every 6 (six) hours  as needed for mild pain or headache.    [provider]  albuterol (VENTOLIN HFA) 108 (90 Base) MCG/ACT inhaler Inhale 2 puffs into the lungs every 4 (four) hours as needed for wheezing. 09/01/22   Kathyrn Drown, MD  amiodarone (PACERONE) 200 MG tablet Take 1 tablet (200 mg total) by mouth daily. 01/06/23   Satira Sark, MD  aspirin EC 81 MG tablet Take 81 mg by mouth daily. Swallow whole.    [provider]  budesonide-formoterol (SYMBICORT) 160-4.5 MCG/ACT inhaler Inhale 2 puffs into the lungs 2 (two) times daily. 08/27/22   Kathyrn Drown, MD  calcium carbonate (OS-CAL) 600 MG TABS tablet Take 600 mg by mouth daily with breakfast.    [provider]  carvedilol (COREG) 3.125 MG tablet Take 1 tablet (3.125 mg total) by mouth 2 (two) times daily with a meal. 11/16/22   Luking, Elayne Snare, MD  cholecalciferol (VITAMIN D) 400 units TABS tablet Take 1,000 Units by mouth daily.    [provider]  clonazePAM (KLONOPIN) 0.5 MG tablet TAKE ONE TABLET BY MOUTH AT BEDTIME AS NEEDED FOR INSOMNIA. 01/19/23   Kathyrn Drown, MD  Dextromethorphan-guaiFENesin (MUCINEX DM) 30-600 MG TB12 Take 1 tablet by mouth daily.     [provider]  finasteride (PROSCAR) 5 MG tablet TAKE ONE TABLET BY MOUTH EVERY DAY 12/30/22   Kathyrn Drown, MD  fluticasone (FLONASE) 50 MCG/ACT nasal spray Place 2 sprays into both nostrils at bedtime. 08/27/22   Kathyrn Drown, MD  ipratropium-albuterol (DUONEB) 0.5-2.5 (3) MG/3ML SOLN Take 3 mLs by nebulization every 4 (four) hours as needed. 08/27/22   Kathyrn Drown, MD  JARDIANCE 10 MG TABS tablet TAKE ONE TABLET BY MOUTH ONCE DAILY BEFORE BREAKFAST 11/17/22   Satira Sark, MD  methimazole (TAPAZOLE) 5 MG tablet TAKE ONE TABLET BY MOUTH EVERY DAY 12/21/22   Cassandria Anger, MD  mirabegron ER (MYRBETRIQ) 50 MG TB24 tablet Take 50 mg by mouth daily.    [provider]  OXYGEN Inhale 2 L into the lungs continuous.     [provider]  oxymetazoline (AFRIN) 0.05 % nasal spray Place 1 spray into both nostrils at bedtime.    [provider]  polyethylene glycol (MIRALAX / GLYCOLAX) 17 g packet Take 17 g by mouth daily as needed.    [provider]  polyvinyl alcohol (LIQUIFILM TEARS) 1.4 % ophthalmic solution Place 1 drop into both eyes as needed for dry eyes.    [provider]  potassium chloride (KLOR-CON) 10 MEQ tablet TAKE 1 TABLET BY MOUTH TWICE DAILY 06/11/21   Kathyrn Drown, MD  pregabalin (LYRICA) 25 MG capsule Take 1 capsule (25 mg total) by mouth 2 (two) times daily. 01/12/23   Kathyrn Drown, MD  rOPINIRole (REQUIP) 2 MG tablet 1 q afternnon and 1 qhs 08/27/22   Luking, Elayne Snare, MD  sacubitril-valsartan (ENTRESTO) 24-26 MG Take 1 tablet by mouth 2 (two) times daily. 12/17/22   Satira Sark, MD  tamsulosin (FLOMAX) 0.4 MG CAPS capsule TAKE (1) CAPSULE BY MOUTH TWICE DAILY. 09/03/22   Kathyrn Drown, MD  torsemide (DEMADEX) 20 MG tablet Take 0.5 tablets (10 mg total) by mouth every other day. 01/06/23 01/06/24  Satira Sark, MD  traMADol Veatrice Bourbon) 50 MG tablet One tid prn pain 11/16/22   Kathyrn Drown, MD  vitamin C (ASCORBIC ACID) 500 MG tablet Take 500 mg by mouth daily.    [provider]  Zinc Acetate, Oral, (ZINC ACETATE PO) Take 1 tablet by mouth daily.    [provider]    Inpatient Medications: Scheduled Meds:  amiodarone  200 mg Oral Daily   carvedilol  3.125 mg Oral BID WC   finasteride  5 mg Oral Daily   ipratropium-albuterol  3 mL Nebulization Q8H   methimazole  5 mg Oral Daily   mometasone-formoterol  2 puff Inhalation BID   sacubitril-valsartan  1 tablet Oral BID   Continuous Infusions:  sodium chloride      ceFAZolin (ANCEF) IV     tranexamic acid     PRN Meds: acetaminophen **OR** acetaminophen, ipratropium-albuterol, morphine injection, polyethylene glycol  Allergies:    Allergies  Allergen Reactions    Cephalexin Diarrhea    And general weakness   Penicillins Other (See Comments)    GI UPSET    Social History:   Social History   Socioeconomic History   Marital status: Widowed    Spouse name: Pamala Hurry   Number of children: 2   Years of education: 16   Highest education level: Not on file  Occupational History    Comment: retired Software engineer  Tobacco Use   Smoking status: Every Day    Packs/day: .5    Types: Cigarettes   Smokeless tobacco: Never   Tobacco comments:    08/18/15 1/2 - 3/4 pack cigarettes daily  Vaping Use   Vaping Use: Never used  Substance and Sexual Activity   Alcohol use: Yes    Alcohol/week: 0.0 standard drinks of alcohol    Comment: mixed drink 1-2 times per month, social   Drug use: No   Sexual activity: Not on file  Other Topics Concern   Not on file  Social History Narrative   Lives at home. Has home health aids to assist with housekeeping/appointments.   Caffeine use- coffee 8-10 cups daily   2 daughters, one lives locally, other lives in [place].   2 granddaughters.   1 great granddaughter-age 27.   Social Determinants of Health   Financial Resource Strain: Low Risk  (03/16/2022)   Overall Financial Resource Strain (CARDIA)    Difficulty of Paying Living Expenses: Not hard at all  Food Insecurity: No Food Insecurity (01/26/2023)   Hunger Vital Sign    Worried About Running Out of Food in the Last Year: Never true    Ran Out of Food in the Last Year: Never true  Transportation Needs: No Transportation Needs (01/26/2023)   PRAPARE - Hydrologist (Medical): No    Lack of Transportation (Non-Medical): No  Physical Activity: Sufficiently Active (03/16/2022)   Exercise Vital Sign    Days of Exercise per Week: 3 days    Minutes of Exercise per Session: 60 min  Stress: No Stress Concern Present (03/16/2022)   [place]  Feeling of Stress : Only a little   Social Connections: Moderately Integrated (03/16/2022)   Social Connection and Isolation Panel [NHANES]    Frequency of Communication with Friends and Family: More than three times a week    Frequency of Social Gatherings with Friends and Family: More than three times a week    Attends Religious Services: More than 4 times per year    Active Member of [employer] or Organizations: Yes    Attends Archivist Meetings: More than 4 times per year    Marital Status: Widowed  Intimate Partner Violence: Not At Risk (01/26/2023)   Humiliation, Afraid, Rape, and Kick questionnaire    Fear of Current or Ex-Partner: No    Emotionally Abused: No    Physically Abused: No    Sexually Abused: No    Family History:    Family History  Problem Relation Age of Onset   Colon cancer Brother    Diabetes Brother    Diabetes Brother      ROS:  Please see the history of present illness.   All other ROS reviewed and negative.     Physical Exam/Data:   Vitals:   01/26/23 2252 01/27/23 0033 01/27/23 0447 01/27/23 0746  BP:  (!) 150/82 114/63   Pulse:  86 87   Resp:  16 18   Temp:  98.8 F (37.1 C) 98 F (36.7 C)   TempSrc:      SpO2: 95% 96% 94% 95%  Weight:      Height:        Intake/Output Summary (Last 24 hours) at 01/27/2023 0749 Last data filed at 01/27/2023 0525 Gross per 24 hour  Intake --  Output 750 ml  Net -750 ml      01/26/2023    9:34 PM 01/26/2023    5:09 PM 01/12/2023    1:29 PM  Last 3 Weights  Weight (lbs) 202 lb 6.1 oz 200 lb 205 lb  Weight (kg) 91.8 kg 90.719 kg 92.987 kg     Body mass index is 30.77 kg/m.  General: Pleasant, elderly male appearing in no acute distress.  HEENT: normal Neck: no JVD Vascular: No carotid bruits; Distal pulses 2+ bilaterally Cardiac:  normal S1, S2; RRR; no murmur Lungs: Expiratory wheezing along upper lung fields. No rales Abd: soft, nontender, no hepatomegaly  Ext: trace lower extremity edema Musculoskeletal:  No deformities, BUE  and BLE strength normal and equal Skin: warm and dry  Neuro:  CNs 2-12 intact, no focal abnormalities noted Psych:  Normal affect   EKG:  The EKG was personally reviewed and demonstrates: NSR, HR 75 with 1st degree AV block and IVCD with calculated QTc at 536 ms and corrected at 537 ms.   Telemetry:  Telemetry was personally reviewed and demonstrates: NSR, HR in 80's with occasional PVC's and episodes of NSVT up to 3 beats.   Relevant CV Studies:  Echocardiogram: 11/2022 IMPRESSIONS     1. Left ventricular ejection fraction, by estimation, is 30 to 35%. The  left ventricle has moderately decreased function. The left ventricle has  no regional wall motion abnormalities. There is mild left ventricular  hypertrophy. Left ventricular  diastolic parameters are consistent with Grade I diastolic dysfunction  (impaired relaxation).   2. Right ventricular systolic function is normal. The right ventricular  size is mildly enlarged. Tricuspid regurgitation signal is inadequate for  assessing PA pressure.   3. Left atrial size was moderately dilated.   4. Right atrial  size was moderately dilated.   5. The mitral valve is normal in structure. Mild mitral valve  regurgitation. No evidence of mitral stenosis.   6. The aortic valve is tricuspid. Aortic valve regurgitation is not  visualized. No aortic stenosis is present.   7. The inferior vena cava is normal in size with greater than 50%  respiratory variability, suggesting right atrial pressure of 3 mmHg.   Comparison(s): Prior images reviewed side by side. Changes from prior  study are noted. LVEF worsened from 40-45% in 12/2021 to 30-35% now.   Laboratory Data:  High Sensitivity Troponin:  No results for input(s): "TROPONINIHS" in the last 720 hours.   Chemistry Recent Labs  Lab 01/26/23 1823 01/27/23 0516  NA 137 140  K 3.9 4.0  CL 102 104  CO2 26 28  GLUCOSE 127* 107*  BUN 29* 24*  CREATININE 1.38* 1.32*  CALCIUM 9.3 9.1   GFRNONAA 50* 53*  ANIONGAP 9 8    Recent Labs  Lab 01/26/23 1823  PROT 7.3  ALBUMIN 3.8  AST 16  ALT 16  ALKPHOS 95  BILITOT 0.6   Lipids No results for input(s): "CHOL", "TRIG", "HDL", "LABVLDL", "LDLCALC", "CHOLHDL" in the last 168 hours.  Hematology Recent Labs  Lab 01/26/23 1823 01/27/23 0516  WBC 13.2* 10.9*  RBC 5.10 4.82  HGB 16.0 15.0  HCT 50.3 47.9  MCV 98.6 99.4  MCH 31.4 31.1  MCHC 31.8 31.3  RDW 14.7 14.9  PLT 202 199   Thyroid No results for input(s): "TSH", "FREET4" in the last 168 hours.  BNP Recent Labs  Lab 01/26/23 1823  BNP 253.0*    DDimer No results for input(s): "DDIMER" in the last 168 hours.   Radiology/Studies:  DG CHEST PORT 1 VIEW  Result Date: 01/26/2023 CLINICAL DATA:  [id number], preoperative chest x-ray for left femoral neck fracture fixation. EXAM: PORTABLE CHEST 1 VIEW COMPARISON:  Chest CT no contrast 11/04/2022 FINDINGS: The lungs are clear of infiltrates. The sulci are sharp. Stable mediastinum with mild aortic calcification and tortuosity. There is mild cardiomegaly without CHF. C5-6 ACDF plating is again shown. Moderate thoracic spondylosis. IMPRESSION: 1. No acute cardiopulmonary findings. 2. Mild cardiomegaly. 3. Aortic atherosclerosis. Electronically Signed   By: Telford Nab M.D.   On: 01/26/2023 22:12   DG Hip Unilat W or Wo Pelvis 2-3 Views Left  Result Date: 01/26/2023 CLINICAL DATA:  Fall with left hip pain EXAM: DG HIP (WITH OR WITHOUT PELVIS) 2-3V LEFT COMPARISON:  10/14/2015 FINDINGS: Right hip replacement. Pubic symphysis and rami appear intact. Acute left femoral neck fracture with superior displacement of the trochanter. No femoral head dislocation IMPRESSION: Acute displaced left femoral neck fracture. Electronically Signed   By: Donavan Foil M.D.   On: 01/26/2023 17:43     Assessment and Plan:   1. Preoperative Cardiac Clearance for Hemiarthroplasty - While he is at increased risk given his known cardiomyopathy  and COPD requiring oxygen supplementation, there are no modifiable factors to address prior to undergoing surgery. He is of at least intermediate-risk given his cardiac history, COPD and advanced age. His RCRI risk is at 0.9% risk of a major cardiac event but again, feel that this is higher given the above factors. Given his recent echocardiogram in 11/2022, he does not require repeat imaging prior to surgery. Will obtain a follow-up EKG for reassessment of his QTc. Would continue to follow on telemetry in the postoperative setting given his history of arrhythmias.  2. Chronic HFrEF  -  His EF was at 45-50% in 02/2019, at 40-45% in 12/2021 and 30-35% in 11/2022. It does not appear that ischemic evaluation has been pursued given his age and no reported anginal symptoms. Of note, prior CTA chest in 05/2022 was read as showing only mild coronary artery calcification along the LM and LCx. - His BNP was at 253 on admission which is overall similar to prior values and he does not appear significantly volume overloaded by examination today. He has been continued on Coreg 3.125 mg twice daily and Entresto 24-26 mg twice daily. PTA Torsemide 10mg  daily and Jardiance 10mg  daily currently held given his NPO status.   3. SVT/Prolonged QTc - Prior notes mention atrial fibrillation but he has not had definitive documentation of this. Prior monitor in 04/2019 was felt to be most consistent with SVT. He has maintained normal sinus rhythm on telemetry with occasional PVC's and brief episodes of NSVT for 3 beats. K+ was at 4.0 today. Will check Mg.  - He is on Amiodarone 200 mg daily and given his prolonged QTc, will reduce to 100mg  daily and obtain a repeat EKG. Remains on Coreg 3.125mg  BID.   4. HTN - BP was initially elevated but improved to 114/63 on most recent check. Continue Coreg and Entresto.  5. COPD - He is on 2L [facility] at baseline. Does have wheezing on examination. Management per the admitting team.     For  questions or updates, please contact [place] Please consult [email or web address] for contact info under    Signed, Erma Heritage, PA-C  01/27/2023 7:49 AM   Attending attestation  Patient seen and independently examined with Bernerd Pho, PA-C. We discussed all aspects of the encounter. I agree with the assessment and plan as stated above.  Patient is a 87 year old M known to have cardiomyopathy LVEF 35% on medical management, SVT on amiodarone c/w prolonged QTc, HTN, COPD is currently admitted to hospitalist team for the management of acute displaced femoral neck fracture and he is planning to undergo left hemiarthroplasty later today. Patient denies having any symptoms of DOE, angina, dizziness, lightheadedness, syncope in the last 6 months. He had a mechanical fall resulting in the hip fracture. Is compliant with his medications and has no side effects.  Physical examination today is remarkable for, patient is not in acute respiratory distress, JVD elevated to just below angle of mandible, S1-S2 heard, fine Rales bilateral, nontender abdomen, trace pitting edema in bilateral lower extremities.  Labs remarkable for, BNP 253, C x-ray showed mild cardiomegaly with no acute cardiopulmonary findings, WBC 13.2, platelets 202, serum creatinine 1.38 (baseline 1.5 to 1.6) and hemoglobin 16.  EKG showed prolonged QTc interval, more than 500 ms and a new EKG showed prolonged QTc more than 600 ms. He got a Zofran yesterday and an inhaler this morning that could prolong QTc per pharmacy input.  # Preop cardiac risk stratification for L hip arthroplasty: Patient is mildly volume overloaded with JVD elevation to just below the angle of mandible and BNP elevation to 253, will administer IV Lasix 80 mg one-time. No angina. Echo showed gradually decreasing LV systolic dysfunction, LVEF 35%.unclear if this is ischemic cardiomyopathy as ischemia evaluation is deferred due to his age. EKG showed NSR,  prolonged QTc interval, more than 600 ms. just based on his comorbidities of advanced age and cardiomyopathy, he is at an increased risk for any perioperative cardiac complications. Avoid any medications prolonging QTc in the OR. # Prolonged QTc:He got a Zofran  yesterday and an inhaler this morning that could prolong QTc per pharmacy input. Discontinue amiodarone and continue to hold any further QTc prolonging medications. Administered magnesium 2 g IV one-time despite normal magnesium levels. Thankfully he does not have any frequent PVCs that could cause VT/V-fib. # Cardiomyopathy LVEF 35%: IV Lasix 80 mg one-time dose due to JVD elevation, BNP mildly elevated.  Will reassess the patient after the procedure and resume GDMT.  I have spent a total of 68 minutes with patient reviewing chart , telemetry, EKGs, labs and examining patient as well as establishing an assessment and plan that was discussed with the patient.  > 50% of time was spent in direct patient care.    Marji Kuehnel Fidel Levy, MD [place]  [facility]  11:14 AM

## 2023-01-27 NOTE — Anesthesia Preprocedure Evaluation (Signed)
Anesthesia Evaluation  Patient identified by MRN, date of birth, ID band Patient awake    Reviewed: Allergy & Precautions, H&P , NPO status , Patient's Chart, lab work & pertinent test results, reviewed documented beta blocker date and time   Airway Mallampati: II  TM Distance: >3 FB Neck ROM: full    Dental no notable dental hx.    Pulmonary neg pulmonary ROS, COPD, Current Smoker   Pulmonary exam normal breath sounds clear to auscultation       Cardiovascular Exercise Tolerance: Good hypertension, + Peripheral Vascular Disease  negative cardio ROS  Rhythm:regular Rate:Normal     Neuro/Psych negative neurological ROS  negative psych ROS   GI/Hepatic negative GI ROS, Neg liver ROS,,,  Endo/Other  negative endocrine ROSWell Controlled, Type 2 Hyperthyroidism   Renal/GU Renal diseasenegative Renal ROS  negative genitourinary   Musculoskeletal   Abdominal   Peds  Hematology negative hematology ROS (+)   Anesthesia Other Findings   Reproductive/Obstetrics negative OB ROS                             Anesthesia Physical Anesthesia Plan  ASA: 4 and emergent  Anesthesia Plan: General and General ETT   Post-op Pain Management:    Induction: Intravenous  PONV Risk Score and Plan: Ondansetron  Airway Management Planned:   Additional Equipment:   Intra-op Plan:   Post-operative Plan: Possible Post-op intubation/ventilation  Informed Consent: I have reviewed the patients History and Physical, chart, labs and discussed the procedure including the risks, benefits and alternatives for the proposed anesthesia with the patient or authorized representative who has indicated his/her understanding and acceptance.     Dental Advisory Given  Plan Discussed with: CRNA  Anesthesia Plan Comments:        Anesthesia Quick Evaluation

## 2023-01-27 NOTE — Transfer of Care (Signed)
Immediate Anesthesia Transfer of Care Note  Patient: Gerald Hall  Procedure(s) Performed: ARTHROPLASTY BIPOLAR HIP (HEMIARTHROPLASTY) (Left: Hip)  Patient Location: PACU  Anesthesia Type:General  Level of Consciousness: awake and patient cooperative  Airway & Oxygen Therapy: Patient Spontanous Breathing and Patient connected to nasal cannula oxygen  Post-op Assessment: Report given to RN and Post -op Vital signs reviewed and stable  Post vital signs: Reviewed and stable  Last Vitals:  Vitals Value Taken Time  BP 112/79 01/27/23 1700  Temp 98.5 01/27/23 1700  Pulse 89 01/27/23 1700  Resp 20 01/27/23 1700  SpO2 95 % 01/27/23 1700    Last Pain:  Vitals:   01/27/23 1320  TempSrc: Oral  PainSc: 2       Patients Stated Pain Goal: 0 ([id number])  Complications: No notable events documented.

## 2023-01-27 NOTE — Progress Notes (Addendum)
   Repeat EKG showed QTc is significantly prolonged. Reviewed with Dr. Dellia Cloud who recommended stopping Amiodarone and will order 2g Magnesium. Will ask Pharmacy to review for any other medications that could contribute to QTc prolongation. Repeat EKG tomorrow.   Signed, Erma Heritage, PA-C 01/27/2023, 9:46 AM   Reviewed with pharmacy and he did receive Zofran yesterday but this was already discontinued. Currently receiving Dulera and will see if the Hospitalist can switch this to a different agent. Dr. Dellia Cloud did want him to receive IV Lasix 80mg  and this has been ordered.   Signed, Erma Heritage, PA-C 01/27/2023, 10:58 AM

## 2023-01-27 NOTE — Anesthesia Procedure Notes (Signed)
Procedure Name: Intubation Date/Time: 01/27/2023 2:34 PM  Performed by: Mammie Russian, RNPre-anesthesia Checklist: Patient identified, Emergency Drugs available, Suction available and Patient being monitored Patient Re-evaluated:Patient Re-evaluated prior to induction Oxygen Delivery Method: Circle system utilized Preoxygenation: Pre-oxygenation with 100% oxygen Induction Type: IV induction Ventilation: Mask ventilation without difficulty and Oral airway inserted - appropriate to patient size Laryngoscope Size: Glidescope and 3 Grade View: Grade I Tube type: Oral Tube size: 7.5 mm Number of attempts: 1 Airway Equipment and Method: Stylet and Video-laryngoscopy Placement Confirmation: ETT inserted through vocal cords under direct vision, positive ETCO2 and breath sounds checked- equal and bilateral Secured at: 24 cm Tube secured with: Tape Dental Injury: Teeth and Oropharynx as per pre-operative assessment

## 2023-01-27 NOTE — Consult Note (Signed)
ORTHOPAEDIC CONSULTATION  REQUESTING PHYSICIAN: Heath Lark D, DO  ASSESSMENT AND PLAN: 88 y.o. male with the following: Left Hip Displaced femoral neck fracture  This patient requires inpatient admission to the hospitalist, to include preoperative clearance and perioperative medical management  - Weight Bearing Status/Activity: NWB Left lower extremity  - Additional recommended labs/tests: Preop Labs: CBC, BMP, PT/INR, Chest XR, and EKG  -VTE Prophylaxis: Please hold prior to OR; to resume POD#1 at the discretion of the primary team  - Pain control: Recommend PO pain medications PRN; judicious use of narcotics  - Follow-up plan: F/u 10-14 days postop  -Procedures: Plan for OR once patient has been medically optimized  Plan for Left Hip hemiarthroplasty  If medically stable, will plan to proceed with surgery 01/27/23  Chief Complaint: Left hip pain  HPI: Gerald Hall is a 88 y.o. male who presented to the ED for evaluation after sustaining a mechanical fall.  He uses a walker at baseline.  He was walking yesterday, bent over to pick up a kleenex and fell.  He landed on his left hip.  He has pain in his left hip.  He did not hit his head.  He is comfortable when not moving.  It hurts to move the hip.  He takes 81 mg aspirin daily.  No numbness or tingling.   Past Medical History:  Diagnosis Date   Aortic atherosclerosis 06/23/2022   Seen on CAT scan   Arthritis    Balance problem    Uses a walker   Blood in urine    Sees urology yearly   Deafness in right ear age 12   Hyperlipidemia    Hypertension    PSVT (paroxysmal supraventricular tachycardia)    Reduced ejection fraction concurrent with and due to chronic heart failure 06/23/2022   See echo spring 2023 patient with DOE and some pedal edema has chronic kidney disease stage IIIb   Temporal arteritis 2016   Urinary incontinence    Past Surgical History:  Procedure Laterality Date   BACK SURGERY  2010    [place]     CATARACT EXTRACTION Bilateral 07/30/14, 08/13/2014   CERVICAL SPINE SURGERY  09/01/15   C 4-5 , [place]   COLONOSCOPY  12/19/2007   RMR: 1. External hemorrohids, otherwise normal rectum.2. Normal colon. 3. Normal terminal ileum.   COLONOSCOPY N/A 12/13/2014   Procedure: COLONOSCOPY;  Surgeon: Daneil Dolin, MD;  Location: [facility];  Service: Endoscopy;  Laterality: N/A;  11:15 Pt Request Time   KNEE SURGERY Left    around 2000, arthroscopic   TOTAL HIP ARTHROPLASTY Right 02/23/2017   Procedure: TOTAL HIP ARTHROPLASTY ANTERIOR APPROACH;  Surgeon: Hessie Knows, MD;  Location: [facility] ORS;  Service: Orthopedics;  Laterality: Right;   Social History   Socioeconomic History   Marital status: Widowed    Spouse name: Pamala Hurry   Number of children: 2   Years of education: 16   Highest education level: Not on file  Occupational History    Comment: retired Software engineer  Tobacco Use   Smoking status: Every Day    Packs/day: .5    Types: Cigarettes   Smokeless tobacco: Never   Tobacco comments:    08/18/15 1/2 - 3/4 pack cigarettes daily  Vaping Use   Vaping Use: Never used  Substance and Sexual Activity   Alcohol use: Yes    Alcohol/week: 0.0 standard drinks of alcohol    Comment: mixed drink 1-2 times per  month, social   Drug use: No   Sexual activity: Not on file  Other Topics Concern   Not on file  Social History Narrative   Lives at home. Has home health aids to assist with housekeeping/appointments.   Caffeine use- coffee 8-10 cups daily   2 daughters, one lives locally, other lives in [place].   2 granddaughters.   1 great granddaughter-age 67.   Social Determinants of Health   Financial Resource Strain: Low Risk  (03/16/2022)   Overall Financial Resource Strain (CARDIA)    Difficulty of Paying Living Expenses: Not hard at all  Food Insecurity: No Food Insecurity (01/26/2023)   Hunger Vital Sign    Worried About Running Out of Food in the  Last Year: Never true    Ran Out of Food in the Last Year: Never true  Transportation Needs: No Transportation Needs (01/26/2023)   PRAPARE - Hydrologist (Medical): No    Lack of Transportation (Non-Medical): No  Physical Activity: Sufficiently Active (03/16/2022)   Exercise Vital Sign    Days of Exercise per Week: 3 days    Minutes of Exercise per Session: 60 min  Stress: No Stress Concern Present (03/16/2022)   [place]    Feeling of Stress : Only a little  Social Connections: Moderately Integrated (03/16/2022)   Social Connection and Isolation Panel [NHANES]    Frequency of Communication with Friends and Family: More than three times a week    Frequency of Social Gatherings with Friends and Family: More than three times a week    Attends Religious Services: More than 4 times per year    Active Member of [employer] or Organizations: Yes    Attends Archivist Meetings: More than 4 times per year    Marital Status: Widowed   Family History  Problem Relation Age of Onset   Colon cancer Brother    Diabetes Brother    Diabetes Brother    Allergies  Allergen Reactions   Cephalexin Diarrhea    And general weakness   Penicillins Other (See Comments)    GI UPSET   Prior to Admission medications   Medication Sig Start Date End Date Taking? Authorizing Provider  senna-docusate (SENOKOT-S) 8.6-50 MG tablet Take by mouth. 03/06/20  Yes [provider]  acetaminophen (TYLENOL) 325 MG tablet Take 1,000 mg by mouth every 6 (six) hours as needed for mild pain or headache.    [provider]  albuterol (VENTOLIN HFA) 108 (90 Base) MCG/ACT inhaler Inhale 2 puffs into the lungs every 4 (four) hours as needed for wheezing. 09/01/22   Kathyrn Drown, MD  amiodarone (PACERONE) 200 MG tablet Take 1 tablet (200 mg total) by mouth daily. 01/06/23   Satira Sark, MD  aspirin EC 81 MG  tablet Take 81 mg by mouth daily. Swallow whole.    [provider]  budesonide-formoterol (SYMBICORT) 160-4.5 MCG/ACT inhaler Inhale 2 puffs into the lungs 2 (two) times daily. 08/27/22   Kathyrn Drown, MD  calcium carbonate (OS-CAL) 600 MG TABS tablet Take 600 mg by mouth daily with breakfast.    [provider]  carvedilol (COREG) 3.125 MG tablet Take 1 tablet (3.125 mg total) by mouth 2 (two) times daily with a meal. 11/16/22   Luking, Elayne Snare, MD  cholecalciferol (VITAMIN D) 400 units TABS tablet Take 1,000 Units by mouth daily.    [provider]  clonazePAM (KLONOPIN) 0.5 MG tablet TAKE ONE TABLET BY MOUTH AT BEDTIME AS NEEDED FOR INSOMNIA. 01/19/23   Kathyrn Drown, MD  Dextromethorphan-guaiFENesin (MUCINEX DM) 30-600 MG TB12 Take 1 tablet by mouth daily.     [provider]  finasteride (PROSCAR) 5 MG tablet TAKE ONE TABLET BY MOUTH EVERY DAY 12/30/22   Kathyrn Drown, MD  fluticasone (FLONASE) 50 MCG/ACT nasal spray Place 2 sprays into both nostrils at bedtime. 08/27/22   Kathyrn Drown, MD  ipratropium-albuterol (DUONEB) 0.5-2.5 (3) MG/3ML SOLN Take 3 mLs by nebulization every 4 (four) hours as needed. 08/27/22   Kathyrn Drown, MD  JARDIANCE 10 MG TABS tablet TAKE ONE TABLET BY MOUTH ONCE DAILY BEFORE BREAKFAST 11/17/22   Satira Sark, MD  methimazole (TAPAZOLE) 5 MG tablet TAKE ONE TABLET BY MOUTH EVERY DAY 12/21/22   Cassandria Anger, MD  mirabegron ER (MYRBETRIQ) 50 MG TB24 tablet Take 50 mg by mouth daily.    [provider]  OXYGEN Inhale 2 L into the lungs continuous.    [provider]  oxymetazoline (AFRIN) 0.05 % nasal spray Place 1 spray into both nostrils at bedtime.    [provider]  polyethylene glycol (MIRALAX / GLYCOLAX) 17 g packet Take 17 g by mouth daily as needed.    [provider]  polyvinyl alcohol (LIQUIFILM TEARS) 1.4 % ophthalmic solution Place 1 drop into both eyes as needed for dry  eyes.    [provider]  potassium chloride (KLOR-CON) 10 MEQ tablet TAKE 1 TABLET BY MOUTH TWICE DAILY 06/11/21   Kathyrn Drown, MD  pregabalin (LYRICA) 25 MG capsule Take 1 capsule (25 mg total) by mouth 2 (two) times daily. 01/12/23   Kathyrn Drown, MD  rOPINIRole (REQUIP) 2 MG tablet 1 q afternnon and 1 qhs 08/27/22   Luking, Scott A, MD  sacubitril-valsartan (ENTRESTO) 24-26 MG Take 1 tablet by mouth 2 (two) times daily. 12/17/22   Satira Sark, MD  tamsulosin (FLOMAX) 0.4 MG CAPS capsule TAKE (1) CAPSULE BY MOUTH TWICE DAILY. 09/03/22   Kathyrn Drown, MD  torsemide (DEMADEX) 20 MG tablet Take 0.5 tablets (10 mg total) by mouth every other day. 01/06/23 01/06/24  Satira Sark, MD  traMADol Veatrice Bourbon) 50 MG tablet One tid prn pain 11/16/22   Kathyrn Drown, MD  vitamin C (ASCORBIC ACID) 500 MG tablet Take 500 mg by mouth daily.    [provider]  Zinc Acetate, Oral, (ZINC ACETATE PO) Take 1 tablet by mouth daily.    [provider]   DG CHEST PORT 1 VIEW  Result Date: 01/26/2023 CLINICAL DATA:  [id number], preoperative chest x-ray for left femoral neck fracture fixation. EXAM: PORTABLE CHEST 1 VIEW COMPARISON:  Chest CT no contrast 11/04/2022 FINDINGS: The lungs are clear of infiltrates. The sulci are sharp. Stable mediastinum with mild aortic calcification and tortuosity. There is mild cardiomegaly without CHF. C5-6 ACDF plating is again shown. Moderate thoracic spondylosis. IMPRESSION: 1. No acute cardiopulmonary findings. 2. Mild cardiomegaly. 3. Aortic atherosclerosis. Electronically Signed   By: Telford Nab M.D.   On: 01/26/2023 22:12   DG Hip Unilat W or Wo Pelvis 2-3 Views Left  Result Date: 01/26/2023 CLINICAL DATA:  Fall with left hip pain EXAM: DG HIP (WITH OR WITHOUT PELVIS) 2-3V LEFT COMPARISON:  10/14/2015 FINDINGS: Right hip replacement. Pubic symphysis and rami appear intact. Acute left femoral neck fracture with superior displacement of the  trochanter. No  femoral head dislocation IMPRESSION: Acute displaced left femoral neck fracture. Electronically Signed   By: Donavan Foil M.D.   On: 01/26/2023 17:43    Family History Reviewed and non-contributory, no pertinent history of problems with bleeding or anesthesia    Review of Systems No fevers or chills No numbness or tingling No chest pain + shortness of breath No bowel or bladder dysfunction No GI distress No headaches    OBJECTIVE  Vitals:Patient Vitals for the past 8 hrs:  BP Temp Pulse Resp SpO2  01/27/23 0447 114/63 98 F (36.7 C) 87 18 94 %  01/27/23 0033 (!) 150/82 98.8 F (37.1 C) 86 16 96 %   General: Alert, no acute distress Cardiovascular: Extremities are warm Respiratory: No cyanosis, no use of accessory musculature Skin: No lesions in the area of chief complaint  Neurologic: Sensation intact distally  Psychiatric: Patient is competent for consent with normal mood and affect Lymphatic: No swelling obvious and reported other than the area involved in the exam below Extremities  LLE: Extremity held in a fixed position.  ROM deferred due to known fracture.  Sensation is intact distally in the sural, saphenous, DP, SP, and plantar nerve distribution. 2+ DP pulse.  Toes are WWP.  Active motion intact in the TA/EHL/GS. RLE: Sensation is intact distally in the sural, saphenous, DP, SP, and plantar nerve distribution. 2+ DP pulse.  Toes are WWP.  Active motion intact in the TA/EHL/GS. Tolerates gentle ROM of the hip.  No pain with axial loading.     Test Results Imaging XR of the Left hip demonstrates a Displaced femoral neck fracture.  Labs cbc Recent Labs    01/26/23 1823 01/27/23 0516  WBC 13.2* 10.9*  HGB 16.0 15.0  HCT 50.3 47.9  PLT 202 199     Recent Labs    01/26/23 1823 01/27/23 0516  NA 137 140  K 3.9 4.0  CL 102 104  CO2 26 28  GLUCOSE 127* 107*  BUN 29* 24*  CREATININE 1.38* 1.32*  CALCIUM 9.3 9.1

## 2023-01-27 NOTE — Op Note (Signed)
Orthopaedic Surgery Operative Note (CSN: [id number])  EDWORD PRIDGEON  04/01/1935 Date of Surgery: 01/27/2023   Diagnoses:  Displaced left femoral neck fracture  Procedure: Left hip hemiarthroplasty   Operative Finding Successful completion of the planned procedure.  Stable left hip after completion of hemiarthroplasty    Post-Op Diagnosis: Same Surgeons:Primary: Mordecai Rasmussen, MD Assistants: Ammie Ferrier Location: [facility] OR ROOM 3 Anesthesia: General with local anesthesia Antibiotics: Ancef 2 g with local vancomycin powder 1 g at the surgical site Tourniquet time: N/A Estimated Blood Loss: [id number] cc Complications: None Specimens: None  Implants: Implant Name Type Inv. Item Serial No. Manufacturer Lot No. LRB No. Used Action  STEM SUMMIT PRESSFIT SZ 6 - [id number] Hips STEM SUMMIT PRESSFIT SZ 6  [employer] [id number] Left 1 Implanted  BIPOLAR DEPUY 49 - [id number] Hips BIPOLAR DEPUY 49  [employer] ORTH[employer] [id number] Left 1 Implanted  HEAD FEM STD 28X+1.5 STRL - [id number] Hips HEAD FEM STD 28X+1.5 [employer] [id number] Left 1 Implanted    Indications for Surgery:   TIMBERLAND ZEHRUNG is a 88 y.o. male with a displaced left femoral neck fracture.  IN order to restore form and function, I recommended a left hip hemiarthroplasty.  Procedure and alternatives were discussed in detail.  Benefits and risks of operative and nonoperative management were discussed prior to surgery with the patient and his family and informed consent form was completed.  Specific risks including infection, need for additional surgery, bleeding, damage to surrounding structures, poor healing, failure of the implants, left hip dislocation, persistent pain, and more severe complications associate with anesthesia.  All questions were answered.  He elected to proceed.  Surgical consent was finalized.   Procedure:   The patient was identified properly. Informed consent was obtained and the surgical site  was marked. The patient was taken to the OR where general anesthesia was induced.  The patient was positioned lateral, with the left hip up.  The left leg was prepped and draped in the usual sterile fashion.  Timeout was performed before the beginning of the case.  Ancef dosing was confirmed prior to incision.  Patient received 1 g of TXA before the start of the case  We started by making an incision centered over the greater trochanter with a scalpel. We used the scalpel to continue to dissect to the fascia.  Electrocautery was used to help with hemostasis.  A cobb elevator was used to clear the IT band and then it was pierced with Bovie electrocautery. Mayo scissors were used to cut the fascia in a longitudinal fashion. The gluteus maximus fibers were bluntly split in line with their fibers.   The femur was slowly internally rotated, putting tension on the posterior structures. The short external rotators were identified and tagged with #1 vicryl.  Bovie electrocautery was used to dissect the short external rotators off of the insertion onto the femur.  Next, we identified the capsule and made a T capsulotomy.  The capsule was then tagged with #1 Vicryl sutures.   Next, we visualized the femoral neck fracture. We identified the sciatic nerve by palpation and verified that it was not in danger during the dissection.    We then made a neck cut approximately 1 cm above the lesser trochanter with a saw. We removed the femoral head. We used the box cutter to cut away some of the greater trochanter for ease of insertion of the stem. We then used the canal finder to locate the  femoral canal.   Using the angle of the femoral neck as our guide for version, we broached sequentially. We trialed components and found appropriate fit and stability.  The patient would come to full extension of the hip. The hip was stable at 90 degrees of flexion, and 45 degrees of internal rotation.  Leg lengths were approximately equal.   We then removed the trials as well as the broach. We ensured there were no foreign bodies or bone within the acetabulum. We copiously irrigated the wound.  We measured and placed an appropriately sized cement restrictor within the canal.  Cement was mixed on the back table and when ready, cement was inserted into the canal and pressurized.  We then carefully placed our stem.  Excess cement was removed immediately.  We maintained pressure on the stem while the cement hardened.  An appropriately sized head was placed on to the stem and the hip was reduced. Again, we noted it was stable in the previously mentioned manipulations.   We repaired the posterior capsule.  Short external rotators repaired to the greater trochanter to bone tunnels. We closed the fascia of the iliotibial band and gluteus maximus with running and intterupted Vicryl sutures. We then closed Scarpa's fascia with Vicryl sutures. Skin was closed with 2-0 monocryl and staples.  An Aquacel dressing was placed.  A hib abduction pillow was secured.  Patient was awoken taken to PACU in stable condition.   Post-operative plan:  The patient will be WBAT on the operative extremity. Hip abduction pillow in place at all times when in bed Posterior hip precautions PT/OT    DVT prophylaxis per primary team, no orthopedic contraindications.    Pain control with PRN pain medication preferring oral medicines.   Follow up plan will be scheduled in approximately 14 days for incision check and XR.

## 2023-01-27 NOTE — Anesthesia Procedure Notes (Signed)
Arterial Line Insertion Start/End4/01/2023 2:45 PM, 01/27/2023 2:50 PM Performed by: Louann Sjogren, MD, Mammie Russian, RN, anesthesiologist  Patient location: OR. Preanesthetic checklist: patient identified, IV checked, site marked, risks and benefits discussed, surgical consent, monitors and equipment checked, pre-op evaluation, timeout performed and anesthesia consent Left, radial was placed Catheter size: 20 G Hand hygiene performed  and maximum sterile barriers used   Attempts: 1 Procedure performed without using ultrasound guided technique. Following insertion, dressing applied. Post procedure assessment: normal and unchanged  Patient tolerated the procedure well with no immediate complications. Additional procedure comments: Placed by Mammie Russian SRNA .

## 2023-01-28 DIAGNOSIS — R9431 Abnormal electrocardiogram [ECG] [EKG]: Secondary | ICD-10-CM | POA: Insufficient documentation

## 2023-01-28 DIAGNOSIS — I5022 Chronic systolic (congestive) heart failure: Secondary | ICD-10-CM | POA: Diagnosis present

## 2023-01-28 DIAGNOSIS — S72002A Fracture of unspecified part of neck of left femur, initial encounter for closed fracture: Secondary | ICD-10-CM | POA: Diagnosis not present

## 2023-01-28 DIAGNOSIS — I5023 Acute on chronic systolic (congestive) heart failure: Secondary | ICD-10-CM | POA: Diagnosis present

## 2023-01-28 LAB — CBC
HCT: 44.7 % (ref 39.0–52.0)
Hemoglobin: 14.3 g/dL (ref 13.0–17.0)
MCH: 31.3 pg (ref 26.0–34.0)
MCHC: 32 g/dL (ref 30.0–36.0)
MCV: 97.8 fL (ref 80.0–100.0)
Platelets: 206 10*3/uL (ref 150–400)
RBC: 4.57 MIL/uL (ref 4.22–5.81)
RDW: 15.1 % (ref 11.5–15.5)
WBC: 17.3 10*3/uL — ABNORMAL HIGH (ref 4.0–10.5)
nRBC: 0 % (ref 0.0–0.2)

## 2023-01-28 LAB — BASIC METABOLIC PANEL
Anion gap: 10 (ref 5–15)
BUN: 28 mg/dL — ABNORMAL HIGH (ref 8–23)
CO2: 27 mmol/L (ref 22–32)
Calcium: 8.9 mg/dL (ref 8.9–10.3)
Chloride: 102 mmol/L (ref 98–111)
Creatinine, Ser: 1.56 mg/dL — ABNORMAL HIGH (ref 0.61–1.24)
GFR, Estimated: 43 mL/min — ABNORMAL LOW (ref >60)
Glucose, Bld: 147 mg/dL — ABNORMAL HIGH (ref 70–99)
Potassium: 4.1 mmol/L (ref 3.5–5.1)
Sodium: 139 mmol/L (ref 135–145)

## 2023-01-28 LAB — MAGNESIUM: Magnesium: 2.6 mg/dL — ABNORMAL HIGH (ref 1.7–2.4)

## 2023-01-28 MED ORDER — PREGABALIN 25 MG PO CAPS
25.0000 mg | ORAL_CAPSULE | Freq: Two times a day (BID) | ORAL | Status: DC
  Administered 2023-01-28 – 2023-01-29 (×3): 25 mg via ORAL
  Filled 2023-01-28 (×3): qty 1

## 2023-01-28 MED ORDER — ASPIRIN 81 MG PO TBEC
81.0000 mg | DELAYED_RELEASE_TABLET | Freq: Two times a day (BID) | ORAL | Status: DC
  Administered 2023-01-28 – 2023-02-04 (×15): 81 mg via ORAL
  Filled 2023-01-28 (×16): qty 1

## 2023-01-28 MED ORDER — TORSEMIDE 20 MG PO TABS
10.0000 mg | ORAL_TABLET | ORAL | Status: DC
  Administered 2023-01-28 – 2023-01-30 (×2): 10 mg via ORAL
  Filled 2023-01-28 (×3): qty 1

## 2023-01-28 MED ORDER — SACUBITRIL-VALSARTAN 24-26 MG PO TABS
1.0000 | ORAL_TABLET | Freq: Two times a day (BID) | ORAL | Status: DC
  Administered 2023-01-28 – 2023-01-30 (×5): 1 via ORAL
  Filled 2023-01-28 (×5): qty 1

## 2023-01-28 MED ORDER — TORSEMIDE 20 MG PO TABS: 10.0000 mg | ORAL_TABLET | Freq: Once | ORAL | Status: DC

## 2023-01-28 MED ORDER — FLUTICASONE PROPIONATE 50 MCG/ACT NA SUSP
2.0000 | Freq: Every day | NASAL | Status: DC
  Administered 2023-01-28 – 2023-02-03 (×7): 2 via NASAL
  Filled 2023-01-28 (×5): qty 16

## 2023-01-28 MED ORDER — ROPINIROLE HCL 1 MG PO TABS
2.0000 mg | ORAL_TABLET | Freq: Two times a day (BID) | ORAL | Status: DC
  Administered 2023-01-28 – 2023-01-29 (×2): 2 mg via ORAL
  Filled 2023-01-28 (×4): qty 2

## 2023-01-28 MED ORDER — POLYETHYLENE GLYCOL 3350 17 G PO PACK
17.0000 g | PACK | Freq: Every day | ORAL | Status: DC
  Administered 2023-01-28 – 2023-02-04 (×5): 17 g via ORAL
  Filled 2023-01-28 (×5): qty 1

## 2023-01-28 MED ORDER — ROPINIROLE HCL 1 MG PO TABS: 2.0000 mg | ORAL_TABLET | Freq: Every evening | ORAL | Status: DC | PRN

## 2023-01-28 MED ORDER — IPRATROPIUM-ALBUTEROL 0.5-2.5 (3) MG/3ML IN SOLN
3.0000 mL | Freq: Three times a day (TID) | RESPIRATORY_TRACT | Status: DC
  Administered 2023-01-29 – 2023-02-01 (×12): 3 mL via RESPIRATORY_TRACT
  Filled 2023-01-28 (×12): qty 3

## 2023-01-28 MED ORDER — EMPAGLIFLOZIN 10 MG PO TABS
10.0000 mg | ORAL_TABLET | Freq: Every day | ORAL | Status: DC
  Administered 2023-01-28 – 2023-02-01 (×5): 10 mg via ORAL
  Filled 2023-01-28 (×5): qty 1

## 2023-01-28 MED ORDER — CLONAZEPAM 0.5 MG PO TABS
0.5000 mg | ORAL_TABLET | Freq: Every evening | ORAL | Status: DC | PRN
  Administered 2023-01-28 – 2023-01-30 (×2): 0.5 mg via ORAL
  Filled 2023-01-28 (×2): qty 1

## 2023-01-28 MED ORDER — SODIUM CHLORIDE 0.9 % IV SOLN: INTRAVENOUS | Status: DC

## 2023-01-28 MED ORDER — FUROSEMIDE 10 MG/ML IJ SOLN
80.0000 mg | Freq: Once | INTRAMUSCULAR | Status: AC
  Administered 2023-01-28: 80 mg via INTRAVENOUS
  Filled 2023-01-28: qty 8

## 2023-01-28 MED ORDER — TAMSULOSIN HCL 0.4 MG PO CAPS
0.4000 mg | ORAL_CAPSULE | Freq: Two times a day (BID) | ORAL | Status: DC
  Administered 2023-01-28 – 2023-02-04 (×15): 0.4 mg via ORAL
  Filled 2023-01-28 (×15): qty 1

## 2023-01-28 MED ORDER — MIRABEGRON ER 25 MG PO TB24
50.0000 mg | ORAL_TABLET | Freq: Every day | ORAL | Status: DC
  Administered 2023-01-28 – 2023-02-04 (×7): 50 mg via ORAL
  Filled 2023-01-28 (×8): qty 2

## 2023-01-28 MED ORDER — AMIODARONE HCL 200 MG PO TABS
100.0000 mg | ORAL_TABLET | Freq: Every day | ORAL | Status: DC
  Administered 2023-01-29 – 2023-02-04 (×7): 100 mg via ORAL
  Filled 2023-01-28 (×7): qty 1

## 2023-01-28 NOTE — Progress Notes (Signed)
PROGRESS NOTE    Gerald Hall  RZN:[id number] DOB: 1935-03-31 DOA: 01/26/2023 PCP: Babs Sciara, MD   Brief Narrative:    Gerald Hall is a 88 y.o. male with medical history significant for BPH, atria fib, COPD with chronic respiratory failure, CKD3.  Patient was brought to the ED with reports of a fall and subsequent left hip pain.  At baseline patient ambulates with a walker.  Patient was admitted with closed displaced fracture of left femoral neck and orthopedics to see.  Cardiology consult for clearance completed and he underwent left hip hemiarthroplasty 4/4.  Assessment & Plan:   Principal Problem:   Closed displaced fracture of left femoral neck Active Problems:   Secondary cardiomyopathy   Chronic atrial fibrillation, unspecified   COPD (chronic obstructive pulmonary disease)   HTN (hypertension), benign   Hyperthyroidism   CKD stage 3a, GFR 45-59 ml/min   Chronic respiratory failure with hypoxia  Assessment and Plan:   Closed displaced fracture of left femoral neck status post left hip hemiarthroplasty 4/4 Status post mechanical fall.  Pelvic x-ray shows acute displaced left femoral neck fracture. -Orthopedics for ORIF this afternoon -IV morphine 2 mg every 4 hourly as needed -PT/OT evaluation pending   Secondary cardiomyopathy Chronic bilateral 1+ pitting edema, unchanged.  Last echo shows EF of 30 to 35%, with grade 1 DD. -Continue Entresto, Coreg, Jardiance -Okay to resume torsemide and Jardiance 4/5 -BNP 253 -Chest x-ray with no acute findings   COPD (chronic obstructive pulmonary disease) Diffuse wheezing on exam, no cough, denies dyspnea, reports he missed 2 doses of his bronchodilators.  On 2 L chronically. -Resume DuoNebs, and home regimen   Chronic atrial fibrillation, unspecified Sinus rhythm, rate Controlled on amiodarone and carvedilol.  Not on anticoagulation.  Follows with Dr. Cindra Eves.  QTc prolonged at 536, also with first-degree heart  block with PR interval of 345-  Not new. -Resume amiodarone, carvedilol   CKD stage 3a, GFR 45-59 ml/min Creatinine stable- 1.38. -Continue Entresto   Hyperthyroidism Resume methimazole   HTN (hypertension), benign Systolic 140s to 301T. -Resume carvedilol, tamsulosin   Chronic respiratory failure with hypoxia On 2 L.  Obesity BMI 30.77    DVT prophylaxis:SCDs Code Status: DNR Family Communication: Daughter at bedside 4/4 Disposition Plan: Admit for ORIF Status is: Inpatient Remains inpatient appropriate because: Need for IV medications  Consultants:  Orthopedics Cardiology  Procedures:  None  Antimicrobials:  Anti-infectives (From admission, onward)    Start     Dose/Rate Route Frequency Ordered Stop   01/27/23 2300  ceFAZolin (ANCEF) IVPB 2g/100 mL premix        2 g 200 mL/hr over 30 Minutes Intravenous Every 8 hours 01/27/23 1819 01/28/23 1459   01/27/23 1614  vancomycin (VANCOCIN) powder  Status:  Discontinued          As needed 01/27/23 1614 01/27/23 1819   01/27/23 1200  ceFAZolin (ANCEF) IVPB 2g/100 mL premix        2 g 200 mL/hr over 30 Minutes Intravenous On call to O.R. 01/26/23 1951 01/27/23 1444       Subjective: Patient seen and evaluated today with no new acute complaints or concerns. No acute concerns or events noted overnight.  He states that his hip pain is well-controlled.  He did not sleep well overnight and requested his Requip and clonazepam be restarted.  Objective: Vitals:   01/27/23 1817 01/27/23 2021 01/27/23 2043 01/28/23 0522  BP: (!) 138/108 (!) 127/58  134/82  Pulse: 87 87  84  Resp: 20 17  18   Temp:  98.8 F (37.1 C)  98.4 F (36.9 C)  TempSrc:    Oral  SpO2: 97% 96% 95% 95%  Weight:      Height:        Intake/Output Summary (Last 24 hours) at 01/28/2023 0728 Last data filed at 01/28/2023 0527 Gross per 24 hour  Intake 1368.95 ml  Output 1450 ml  Net -81.05 ml   Filed Weights   01/26/23 1709 01/26/23 2134 01/27/23  1320  Weight: 90.7 kg 91.8 kg 91.8 kg    Examination:  General exam: Appears calm and comfortable  Respiratory system: Clear to auscultation. Respiratory effort normal.  2 L nasal cannula Cardiovascular system: S1 & S2 heard, RRR.  Gastrointestinal system: Abdomen is soft Central nervous system: Alert and awake Extremities: No edema Skin: No significant lesions noted Psychiatry: Flat affect.    Data Reviewed: I have personally reviewed following labs and imaging studies  CBC: Recent Labs  Lab 01/26/23 1823 01/27/23 0516 01/28/23 0500  WBC 13.2* 10.9* 17.3*  NEUTROABS 10.0*  --   --   HGB 16.0 15.0 14.3  HCT 50.3 47.9 44.7  MCV 98.6 99.4 97.8  PLT 202 199 206   Basic Metabolic Panel: Recent Labs  Lab 01/26/23 1823 01/27/23 0516 01/27/23 0550 01/28/23 0500  NA 137 140  --  139  K 3.9 4.0  --  4.1  CL 102 104  --  102  CO2 26 28  --  27  GLUCOSE 127* 107*  --  147*  BUN 29* 24*  --  28*  CREATININE 1.38* 1.32*  --  1.56*  CALCIUM 9.3 9.1  --  8.9  MG  --   --  2.4 2.6*   GFR: Estimated Creatinine Clearance: 38.1 mL/min (A) (by C-G formula based on SCr of 1.56 mg/dL (H)). Liver Function Tests: Recent Labs  Lab 01/26/23 1823  AST 16  ALT 16  ALKPHOS 95  BILITOT 0.6  PROT 7.3  ALBUMIN 3.8   No results for input(s): "LIPASE", "AMYLASE" in the last 168 hours. No results for input(s): "AMMONIA" in the last 168 hours. Coagulation Profile: No results for input(s): "INR", "PROTIME" in the last 168 hours. Cardiac Enzymes: No results for input(s): "CKTOTAL", "CKMB", "CKMBINDEX", "TROPONINI" in the last 168 hours. BNP (last 3 results) No results for input(s): "PROBNP" in the last 8760 hours. HbA1C: No results for input(s): "HGBA1C" in the last 72 hours. CBG: No results for input(s): "GLUCAP" in the last 168 hours. Lipid Profile: No results for input(s): "CHOL", "HDL", "LDLCALC", "TRIG", "CHOLHDL", "LDLDIRECT" in the last 72 hours. Thyroid Function  Tests: No results for input(s): "TSH", "T4TOTAL", "FREET4", "T3FREE", "THYROIDAB" in the last 72 hours. Anemia Panel: No results for input(s): "VITAMINB12", "FOLATE", "FERRITIN", "TIBC", "IRON", "RETICCTPCT" in the last 72 hours. Sepsis Labs: No results for input(s): "PROCALCITON", "LATICACIDVEN" in the last 168 hours.  Recent Results (from the past 240 hour(s))  Surgical pcr screen     Status: None   Collection Time: 01/26/23  9:50 PM   Specimen: Nasal Mucosa; Nasal Swab  Result Value Ref Range Status   MRSA, PCR NEGATIVE NEGATIVE Final   Staphylococcus aureus NEGATIVE NEGATIVE Final    Comment: (NOTE) The Xpert SA Assay (FDA approved for NASAL specimens in patients 69 years of age and older), is one component of a comprehensive surveillance program. It is not intended to diagnose infection nor to guide or monitor  treatment. Performed at [facility], [address] [place], [place] [address]          Radiology Studies: DG HIP UNILAT WITH PELVIS 2-3 VIEWS LEFT  Result Date: 01/27/2023 CLINICAL DATA:  Postop left hip arthroplasty EXAM: DG HIP (WITH OR WITHOUT PELVIS) 2-3V LEFT COMPARISON:  Yesterday FINDINGS: Interval left hip arthroplasty. Remote right hip arthroplasty. No new fracture. IMPRESSION: Expected appearance after left hip arthroplasty. Electronically Signed   By: Jeronimo GreavesKyle  Talbot M.D.   On: 01/27/2023 17:30   DG CHEST PORT 1 VIEW  Result Date: 01/26/2023 CLINICAL DATA:  [id number], preoperative chest x-ray for left femoral neck fracture fixation. EXAM: PORTABLE CHEST 1 VIEW COMPARISON:  Chest CT no contrast 11/04/2022 FINDINGS: The lungs are clear of infiltrates. The sulci are sharp. Stable mediastinum with mild aortic calcification and tortuosity. There is mild cardiomegaly without CHF. C5-6 ACDF plating is again shown. Moderate thoracic spondylosis. IMPRESSION: 1. No acute cardiopulmonary findings. 2. Mild cardiomegaly. 3. Aortic atherosclerosis. Electronically Signed   By:  Almira BarKeith  Chesser M.D.   On: 01/26/2023 22:12   DG Hip Unilat W or Wo Pelvis 2-3 Views Left  Result Date: 01/26/2023 CLINICAL DATA:  Fall with left hip pain EXAM: DG HIP (WITH OR WITHOUT PELVIS) 2-3V LEFT COMPARISON:  10/14/2015 FINDINGS: Right hip replacement. Pubic symphysis and rami appear intact. Acute left femoral neck fracture with superior displacement of the trochanter. No femoral head dislocation IMPRESSION: Acute displaced left femoral neck fracture. Electronically Signed   By: Jasmine PangKim  Fujinaga M.D.   On: 01/26/2023 17:43        Scheduled Meds:  budesonide (PULMICORT) nebulizer solution  0.25 mg Nebulization BID   carvedilol  3.125 mg Oral BID WC   finasteride  5 mg Oral Daily   methimazole  5 mg Oral Daily   Continuous Infusions:  sodium chloride      ceFAZolin (ANCEF) IV 2 g (01/27/23 2255)     LOS: 2 days    Time spent: 35 minutes    Rayann Jolley Hoover Brunette Chalmers Iddings, DO [facility]  If 7PM-7AM, please contact night-coverage [email or web address] 01/28/2023, 7:28 AM

## 2023-01-28 NOTE — Progress Notes (Addendum)
Progress Note  Patient Name: Gerald Hall Date of Encounter: 01/28/2023  Primary Cardiologist: Nona Dell, MD  Subjective   No acute events overnight, status post left hip arthroplasty  Inpatient Medications    Scheduled Meds:  amiodarone  100 mg Oral Daily   aspirin EC  81 mg Oral BID   budesonide (PULMICORT) nebulizer solution  0.25 mg Nebulization BID   carvedilol  3.125 mg Oral BID WC   empagliflozin  10 mg Oral Daily   finasteride  5 mg Oral Daily   fluticasone  2 spray Each Nare QHS   furosemide  80 mg Intravenous Once   methimazole  5 mg Oral Daily   mirabegron ER  50 mg Oral Daily   pregabalin  25 mg Oral BID   sacubitril-valsartan  1 tablet Oral BID   tamsulosin  0.4 mg Oral BID   torsemide  10 mg Oral QODAY   Continuous Infusions:   ceFAZolin (ANCEF) IV 2 g (01/27/23 2255)   PRN Meds: acetaminophen **OR** acetaminophen, clonazePAM, ipratropium-albuterol, morphine injection, polyethylene glycol, rOPINIRole   Vital Signs    Vitals:   01/27/23 2021 01/27/23 2043 01/28/23 0522 01/28/23 1034  BP: (!) 127/58  134/82   Pulse: 87  84   Resp: 17  18   Temp: 98.8 F (37.1 C)  98.4 F (36.9 C)   TempSrc:   Oral   SpO2: 96% 95% 95% 96%  Weight:      Height:        Intake/Output Summary (Last 24 hours) at 01/28/2023 1140 Last data filed at 01/28/2023 0527 Gross per 24 hour  Intake 1368.95 ml  Output 1000 ml  Net 368.95 ml   Filed Weights   01/26/23 1709 01/26/23 2134 01/27/23 1320  Weight: 90.7 kg 91.8 kg 91.8 kg    Telemetry     Personally reviewed, NSR, first-degree AV block and prolonged QTc interval  ECG    NSR, first-degree AV block and prolonged QTc interval  Physical Exam   GEN: No acute distress.   Neck: No JVD. Cardiac: RRR, no murmur, rub, or gallop.  Respiratory: Nonlabored. Clear to auscultation bilaterally. GI: Soft, nontender, bowel sounds present. MS: No edema; No deformity. Neuro:  Nonfocal. Psych: Alert and oriented x  3. Normal affect.  Labs    Chemistry Recent Labs  Lab 01/26/23 1823 01/27/23 0516 01/28/23 0500  NA 137 140 139  K 3.9 4.0 4.1  CL 102 104 102  CO2 26 28 27   GLUCOSE 127* 107* 147*  BUN 29* 24* 28*  CREATININE 1.38* 1.32* 1.56*  CALCIUM 9.3 9.1 8.9  PROT 7.3  --   --   ALBUMIN 3.8  --   --   AST 16  --   --   ALT 16  --   --   ALKPHOS 95  --   --   BILITOT 0.6  --   --   GFRNONAA 50* 53* 43*  ANIONGAP 9 8 10      Hematology Recent Labs  Lab 01/26/23 1823 01/27/23 0516 01/28/23 0500  WBC 13.2* 10.9* 17.3*  RBC 5.10 4.82 4.57  HGB 16.0 15.0 14.3  HCT 50.3 47.9 44.7  MCV 98.6 99.4 97.8  MCH 31.4 31.1 31.3  MCHC 31.8 31.3 32.0  RDW 14.7 14.9 15.1  PLT 202 199 206    Cardiac EnzymesNo results for input(s): "TROPONINIHS" in the last 720 hours.  BNP Recent Labs  Lab 01/26/23 1823  BNP 253.0*  DDimerNo results for input(s): "DDIMER" in the last 168 hours.   Radiology    DG HIP UNILAT WITH PELVIS 2-3 VIEWS LEFT  Result Date: 01/27/2023 CLINICAL DATA:  Postop left hip arthroplasty EXAM: DG HIP (WITH OR WITHOUT PELVIS) 2-3V LEFT COMPARISON:  Yesterday FINDINGS: Interval left hip arthroplasty. Remote right hip arthroplasty. No new fracture. IMPRESSION: Expected appearance after left hip arthroplasty. Electronically Signed   By: Jeronimo Greaves M.D.   On: 01/27/2023 17:30   DG CHEST PORT 1 VIEW  Result Date: 01/26/2023 CLINICAL DATA:  [id number], preoperative chest x-ray for left femoral neck fracture fixation. EXAM: PORTABLE CHEST 1 VIEW COMPARISON:  Chest CT no contrast 11/04/2022 FINDINGS: The lungs are clear of infiltrates. The sulci are sharp. Stable mediastinum with mild aortic calcification and tortuosity. There is mild cardiomegaly without CHF. C5-6 ACDF plating is again shown. Moderate thoracic spondylosis. IMPRESSION: 1. No acute cardiopulmonary findings. 2. Mild cardiomegaly. 3. Aortic atherosclerosis. Electronically Signed   By: Almira Bar M.D.   On:  01/26/2023 22:12   DG Hip Unilat W or Wo Pelvis 2-3 Views Left  Result Date: 01/26/2023 CLINICAL DATA:  Fall with left hip pain EXAM: DG HIP (WITH OR WITHOUT PELVIS) 2-3V LEFT COMPARISON:  10/14/2015 FINDINGS: Right hip replacement. Pubic symphysis and rami appear intact. Acute left femoral neck fracture with superior displacement of the trochanter. No femoral head dislocation IMPRESSION: Acute displaced left femoral neck fracture. Electronically Signed   By: Jasmine Pang M.D.   On: 01/26/2023 17:43    Cardiac Profile   Patient is 88 y/o M known to have cardiomyopathy LVEF 35% on medical management, SVT on amiodarone c/w prolonged QTc, HTN, COPD is currently admitted to hospitalist team for the management of acute displaced femoral neck fracture, s/p L hemiarthroplasty on 01/27/2023.  Assessment & Plan   # Acute systolic heart failure exacerbation LVEF 35%, mildly decompensated: Administer IV Lasix 80 mg one-time dose again today due to JVD elevation till the angle of mandible (he was given IV Lasix 80 mg yesterday with 1.5 L urine output). Initial plan was to administer additional dose of p.o. torsemide due to serum creatinine elevation but upon review of the prior BMP, his baseline creatinine ranges between 1.3 and 1.5. Continue GDMT with Entresto 24-26 mg twice daily and Jardiance 10 mg once daily. # Prolonged QTc: I reviewed all the EKGs on the EMR since 2021.  He has a baseline prolonged PR interval and mildly prolonged QTc interval around the range of 480 ms. QTc improved today on the EKG but still prolonged (more than 500 ms) compared to prior EKGs on the EMR. Will continue to hold amiodarone and any other QTc prolonging agents. # COPD on 2L home oxygen: Continue home oxygen 2L  I have spent a total of 30 minutes with patient reviewing chart , telemetry, EKGs, labs and examining patient as well as establishing an assessment and plan that was discussed with the patient.  > 50% of time was spent  in direct patient care.     Signed, Marjo Bicker, MD  01/28/2023, 11:40 AM

## 2023-01-28 NOTE — [place] FL2 (Signed)
[place] MEDICAID FL2 LEVEL OF CARE FORM     IDENTIFICATION  Patient Name: Gerald Hall Birthdate: [birth year] Sex: male Admission Date (Current Location): 01/26/2023  [facility] and [place] Number:  [employer] and Address:  [facility],  [address] [place] [address]      Provider Number: [id number]  Attending Physician Name and Address:  Erick Blinks, DO  Relative Name and Phone Number:       Current Level of Care: Hospital Recommended Level of Care: Skilled Nursing Facility Prior Approval Number:    Date Approved/Denied:   PASRR Number: [id number]  Discharge Plan: SNF    Current Diagnoses: Patient Active Problem List   Diagnosis Date Noted   Acute on chronic systolic heart failure 01/28/2023   Prolonged Q-T interval on ECG 01/28/2023   Closed displaced fracture of left femoral neck 01/26/2023   CKD stage 3a, GFR 45-59 ml/min 01/26/2023   Chronic respiratory failure with hypoxia 01/26/2023   Reduced ejection fraction concurrent with and due to chronic heart failure 06/23/2022   Aortic atherosclerosis 06/23/2022   Hyperthyroidism 03/04/2022   COPD (chronic obstructive pulmonary disease) 11/22/2021   COPD with acute exacerbation 11/20/2021   Hypokalemia 11/20/2021   Acute left-sided low back pain without sciatica 10/15/2021   Centrilobular emphysema 11/12/2020   Secondary cardiomyopathy 11/12/2020   Subacute thyroiditis 09/25/2020   Brow ptosis, bilateral 07/03/2020   Dermatochalasis of both upper eyelids 07/03/2020   Ptosis, both eyelids 07/03/2020   Impacted cerumen of right ear 03/06/2020   Sensorineural hearing loss (SNHL) of right ear with restricted hearing of left ear 03/06/2020   Chronic atrial fibrillation, unspecified 02/24/2019   Primary osteoarthritis involving multiple joints 02/21/2017   Current smoker 02/21/2017   AMD (age-related macular degeneration), bilateral 11/10/2016   Insomnia 07/20/2016   Restless legs  07/20/2016   Spinal stenosis in cervical region 10/14/2015   Senile purpura 04/18/2015   Osteopenia 01/28/2015   Spinal stenosis of lumbar region 01/20/2015   BPH (benign prostatic hyperplasia) 01/20/2015   HTN (hypertension), benign 04/17/2014   Lumbar disc disease 03/05/2014   Basal cell carcinoma of face 11/08/2011    Orientation RESPIRATION BLADDER Height & Weight     Self, Time, Situation, Place  O2 (2L) External catheter Weight: 202 lb 6.1 oz (91.8 kg) Height:  5\' 8"  (172.7 cm)  BEHAVIORAL SYMPTOMS/MOOD NEUROLOGICAL BOWEL NUTRITION STATUS      Continent Diet (NPO for surgery. See d/c summary for updates.)  AMBULATORY STATUS COMMUNICATION OF NEEDS Skin   Extensive Assist Verbally Bruising, Surgical wounds                       Personal Care Assistance Level of Assistance  Bathing, Feeding, Dressing Bathing Assistance: Maximum assistance Feeding assistance: Limited assistance Dressing Assistance: Maximum assistance     Functional Limitations Info  Hearing, Sight, Speech Sight Info: Impaired Hearing Info: Impaired Speech Info: Adequate    SPECIAL CARE FACTORS FREQUENCY  PT (By licensed PT)     PT Frequency: 5x weekly              Contractures      Additional Factors Info  Code Status, Allergies, Psychotropic Code Status Info: DNR Allergies Info: Cephalexin, Penicillins Psychotropic Info: Klonopin         Current Medications (01/28/2023):  This is the current hospital active medication list Current Facility-Administered Medications  Medication Dose Route Frequency Provider Last Rate Last Admin   acetaminophen (  TYLENOL) tablet 650 mg  650 mg Oral Q6H PRN Oliver Barre, MD       Or   acetaminophen (TYLENOL) suppository 650 mg  650 mg Rectal Q6H PRN Oliver Barre, MD       amiodarone (PACERONE) tablet 100 mg  100 mg Oral Daily Ellsworth Lennox, PA-C       aspirin EC tablet 81 mg  81 mg Oral BID Maurilio Lovely D, DO   81 mg at 01/28/23 1011    budesonide (PULMICORT) nebulizer solution 0.25 mg  0.25 mg Nebulization BID Oliver Barre, MD   0.25 mg at 01/28/23 1034   carvedilol (COREG) tablet 3.125 mg  3.125 mg Oral BID WC Oliver Barre, MD   3.125 mg at 01/28/23 0854   ceFAZolin (ANCEF) IVPB 2g/100 mL premix  2 g Intravenous Q8H Oliver Barre, MD 200 mL/hr at 01/27/23 2255 2 g at 01/27/23 2255   clonazePAM (KLONOPIN) tablet 0.5 mg  0.5 mg Oral QHS PRN Maurilio Lovely D, DO       empagliflozin (JARDIANCE) tablet 10 mg  10 mg Oral Daily Sherryll Burger, Pratik D, DO   10 mg at 01/28/23 1011   finasteride (PROSCAR) tablet 5 mg  5 mg Oral Daily Oliver Barre, MD   5 mg at 01/28/23 0854   fluticasone (FLONASE) 50 MCG/ACT nasal spray 2 spray  2 spray Each Nare QHS Shah, Pratik D, DO       furosemide (LASIX) injection 80 mg  80 mg Intravenous Once Mallipeddi, Vishnu P, MD       ipratropium-albuterol (DUONEB) 0.5-2.5 (3) MG/3ML nebulizer solution 3 mL  3 mL Nebulization Q4H PRN Oliver Barre, MD       methimazole (TAPAZOLE) tablet 5 mg  5 mg Oral Daily Thane Edu A, MD   5 mg at 01/28/23 0900   mirabegron ER (MYRBETRIQ) tablet 50 mg  50 mg Oral Daily Sherryll Burger, Pratik D, DO   50 mg at 01/28/23 1011   morphine (PF) 2 MG/ML injection 2 mg  2 mg Intravenous Q4H PRN Oliver Barre, MD   2 mg at 01/28/23 0855   polyethylene glycol (MIRALAX / GLYCOLAX) packet 17 g  17 g Oral Daily PRN Oliver Barre, MD       pregabalin (LYRICA) capsule 25 mg  25 mg Oral BID Sherryll Burger, Pratik D, DO   25 mg at 01/28/23 1011   rOPINIRole (REQUIP) tablet 2 mg  2 mg Oral QHS PRN Sherryll Burger, Pratik D, DO       sacubitril-valsartan (ENTRESTO) 24-26 mg per tablet  1 tablet Oral BID Maurilio Lovely D, DO   1 tablet at 01/28/23 0854   tamsulosin (FLOMAX) capsule 0.4 mg  0.4 mg Oral BID Maurilio Lovely D, DO   0.4 mg at 01/28/23 1012   torsemide (DEMADEX) tablet 10 mg  10 mg Oral Jasper Loser, Pratik D, DO   10 mg at 01/28/23 1011     Discharge Medications: Please see discharge summary for a list of  discharge medications.  Relevant Imaging Results:  Relevant Lab Results:   Additional Information SSN: [id number]  [name], [place]

## 2023-01-28 NOTE — Plan of Care (Signed)
  Problem: Acute Rehab OT Goals (only OT should resolve) Goal: Pt. Will Perform Grooming Flowsheets (Taken 01/28/2023 1146) Pt Will Perform Grooming:  with set-up  sitting Goal: Pt. Will Perform Lower Body Bathing Flowsheets (Taken 01/28/2023 1146) Pt Will Perform Lower Body Bathing:  with mod assist  with min assist  sitting/lateral leans  with adaptive equipment Goal: Pt. Will Perform Upper Body Dressing Flowsheets (Taken 01/28/2023 1146) Pt Will Perform Upper Body Dressing:  with set-up  sitting Goal: Pt. Will Perform Lower Body Dressing Flowsheets (Taken 01/28/2023 1146) Pt Will Perform Lower Body Dressing:  with mod assist  with min assist  with adaptive equipment  sitting/lateral leans Goal: Pt. Will Transfer To Toilet Flowsheets (Taken 01/28/2023 1146) Pt Will Transfer to Toilet:  with min assist  stand pivot transfer  with min guard assist Goal: Pt. Will Perform Toileting-Clothing Manipulation Flowsheets (Taken 01/28/2023 1146) Pt Will Perform Toileting - Clothing Manipulation and hygiene:  with min guard assist  sitting/lateral leans Goal: Pt/Caregiver Will Perform Home Exercise Program Flowsheets (Taken 01/28/2023 1146) Pt/caregiver will Perform Home Exercise Program:  Increased strength  Increased ROM  Both right and left upper extremity  Independently  With written HEP provided  Danie Chandler OT, MOT

## 2023-01-28 NOTE — Evaluation (Signed)
Occupational Therapy Evaluation Patient Details Name: Gerald Hall MRN: [id number] DOB: 1935/08/09 Today's Date: 01/28/2023   History of Present Illness Gerald Hall is a 88 y.o. male with medical history significant for BPH, atria fib, COPD with chronic respiratory failure, CKD3.  Patient was brought to the ED with reports of a fall and subsequent left hip pain.  At baseline patient ambulates with a walker.  Today he bent over to pick something from the floor (Tissue) and fell over onto his left hip.  He otherwise has no chest pain or difficulty breathing, no dizziness.  No memory problems.  He has a caregiver that lives with him.  No cough.  Swelling to his bilateral lower extremities that is at baseline today.  He is on chronic O2.   Clinical Impression   Pt agreeable to OT and PT co-evaluation. Pt is independent at baseline for ADL's and mobility using rollator. Pt reportedly has a Designer, multimedia24/7 sitter that will not be able to provide physical assist for the pt. Pt is needing mod to max A for standing and attempted steps laterally. Pt reports increased weakness in B UE with noted limitation in A/ROM. Pt given B UE active range of motion exercise handout at end of session. Pt will benefit from continued OT in the hospital and recommended venue below to increase strength, balance, and endurance for safe ADL's.         Recommendations for follow up therapy are one component of a multi-disciplinary discharge planning process, led by the attending physician.  Recommendations may be updated based on patient status, additional functional criteria and insurance authorization.   Assistance Recommended at Discharge Frequent or constant Supervision/Assistance  Patient can return home with the following Two people to help with walking and/or transfers;A lot of help with bathing/dressing/bathroom;Assistance with cooking/housework;Assist for transportation;Help with stairs or ramp for entrance    Functional  Status Assessment  Patient has had a recent decline in their functional status and demonstrates the ability to make significant improvements in function in a reasonable and predictable amount of time.  Equipment Recommendations  None recommended by OT    Recommendations for Other Services       Precautions / Restrictions Precautions Precautions: Fall;Posterior Hip Precaution Comments: adduction pillow in bed Restrictions Weight Bearing Restrictions: Yes LLE Weight Bearing: Weight bearing as tolerated      Mobility Bed Mobility Overal bed mobility: Needs Assistance Bed Mobility: Supine to Sit, Sit to Supine     Supine to sit: Mod assist, HOB elevated Sit to supine: Mod assist   General bed mobility comments: slow, labored, assist for LLE mobility and to pull to seated EOB; assist for BLE back into bed    Transfers Overall transfer level: Needs assistance Equipment used: Rolling walker (2 wheels) Transfers: Sit to/from Stand Sit to Stand: Mod assist to Max A           General transfer comment: cueing for sequencing and proper RW use, assist for weakness/pain      Balance Overall balance assessment: Needs assistance Sitting-balance support: No upper extremity supported, Feet supported Sitting balance-Leahy Scale: Good Sitting balance - Comments: seated EOB Postural control: Right lateral lean Standing balance support: Bilateral upper extremity supported, Reliant on assistive device for balance Standing balance-Leahy Scale: Poor                             ADL either performed or assessed with clinical judgement  ADL Overall ADL's : Needs assistance/impaired     Grooming: Minimal assistance;Moderate assistance;Sitting   Upper Body Bathing: Minimal assistance;Moderate assistance;Sitting   Lower Body Bathing: Total assistance;Maximal assistance;Sitting/lateral leans   Upper Body Dressing : Minimal assistance;Moderate assistance;Sitting   Lower  Body Dressing: Maximal assistance;Total assistance;Bed level Lower Body Dressing Details (indicate cue type and reason): Assisted to don socks supine in bed today. Toilet Transfer: Moderate assistance;Maximal assistance;Stand-pivot;Rolling walker (2 wheels) Toilet Transfer Details (indicate cue type and reason): Partially simulated via sit to stand from EOB. Toileting- Clothing Manipulation and Hygiene: Maximal assistance;Total assistance;Bed level               Vision Baseline Vision/History: 1 Wears glasses Ability to See in Adequate Light: 1 Impaired Patient Visual Report: No change from baseline Vision Assessment?: No apparent visual deficits                Pertinent Vitals/Pain Pain Assessment Pain Assessment: Faces Faces Pain Scale: Hurts little more Pain Location: L hip Pain Descriptors / Indicators: Grimacing, Guarding Pain Intervention(s): Limited activity within patient's tolerance, Monitored during session, Repositioned     Hand Dominance Right   Extremity/Trunk Assessment Upper Extremity Assessment Upper Extremity Assessment: Generalized weakness (LImited to ~75% AROM of B UE. Pt reported feeling weaker in is arms than is typical for him. Pt reports arthrits at baseline.)   Lower Extremity Assessment Lower Extremity Assessment: Defer to PT evaluation   Cervical / Trunk Assessment Cervical / Trunk Assessment: Kyphotic   Communication Communication Communication: HOH;Deaf   Cognition Arousal/Alertness: Awake/alert Behavior During Therapy: WFL for tasks assessed/performed Overall Cognitive Status: Within Functional Limits for tasks assessed                                                        Home Living Family/patient expects to be discharged to:: Private residence Living Arrangements: Alone;Other (Comment) (caregiver/sitter) Available Help at Discharge: Personal care attendant;Available 24 hours/day (sitter) Type of Home:  House Home Access: Ramped entrance     Home Layout: One level     Bathroom Shower/Tub: Producer, television/film/video: Standard Bathroom Accessibility: No (One bathroom can fit rollator per pt report.)   Home Equipment: Rolling Walker (2 wheels);Rollator (4 wheels);Shower seat;BSC/3in1;Cane - single point;Other (comment) (Lift chair.)          Prior Functioning/Environment Prior Level of Function : Independent/Modified Independent             Mobility Comments: Ambulates using rollator. ADLs Comments: Indepndent ADL: assist for IADL. patient has a caregiver who assists with cooking, cleaning, etc but does not provide patient with physical assist        OT Problem List: Decreased strength;Decreased range of motion;Decreased activity tolerance;Impaired balance (sitting and/or standing);Decreased safety awareness;Impaired UE functional use      OT Treatment/Interventions: Self-care/ADL training;Therapeutic exercise;DME and/or AE instruction;Therapeutic activities;Patient/family education;Balance training    OT Goals(Current goals can be found in the care plan section) Acute Rehab OT Goals Patient Stated Goal: Pt and family open to rehab to get stronger. OT Goal Formulation: With patient/family Time For Goal Achievement: 02/11/23 Potential to Achieve Goals: Good  OT Frequency: Min 2X/week    Co-evaluation PT/OT/SLP Co-Evaluation/Treatment: Yes Reason for Co-Treatment: To address functional/ADL transfers PT goals addressed during session: Mobility/safety with mobility;Balance;Proper use of DME;Strengthening/ROM OT goals  addressed during session: ADL's and self-care                       End of Session Equipment Utilized During Treatment: Rolling walker (2 wheels);Oxygen  Activity Tolerance: Patient tolerated treatment well Patient left: in bed;with call bell/phone within reach;with family/visitor present  OT Visit Diagnosis: Unsteadiness on feet  (R26.81);Other abnormalities of gait and mobility (R26.89);Muscle weakness (generalized) (M62.81)                Time: 2060-1561 OT Time Calculation (min): 36 min Charges:  OT General Charges $OT Visit: 1 Visit OT Evaluation $OT Eval Low Complexity: 1 Low  Lucca Greggs OT, MOT  Danie Chandler 01/28/2023, 11:42 AM

## 2023-01-28 NOTE — TOC Progression Note (Signed)
Transition of Care [facility]) - Progression Note    Patient Details  Name: NAMISH FENDT MRN: [id number] Date of Birth: [birth year]  Transition of Care [facility]) CM/SW Contact  [name], [place] Phone Number: 01/28/2023, 12:39 PM  Clinical Narrative:    CSW spoke with pts daughter who states pt and family are agreeable to SNF at D/C. CSW completed referral and sent out to [place] [facility] [place] facilities at family request. TOC to follow.   Expected Discharge Plan: Skilled Nursing Facility Barriers to Discharge: Continued Medical Work up  Expected Discharge Plan and Services In-house Referral: Clinical Social Work     Living arrangements for the past 2 months: Single Family Home                                       Social Determinants of Health (SDOH) Interventions SDOH Screenings   Food Insecurity: No Food Insecurity (01/26/2023)  Housing: Low Risk  (01/26/2023)  Transportation Needs: No Transportation Needs (01/26/2023)  Utilities: Not At Risk (01/26/2023)  Alcohol Screen: Low Risk  (03/16/2022)  Depression (PHQ2-9): Low Risk  (01/12/2023)  Financial Resource Strain: Low Risk  (03/16/2022)  Physical Activity: Sufficiently Active (03/16/2022)  Social Connections: Moderately Integrated (03/16/2022)  Stress: No Stress Concern Present (03/16/2022)  Tobacco Use: High Risk (01/27/2023)    Readmission Risk Interventions     No data to display

## 2023-01-28 NOTE — Evaluation (Signed)
Physical Therapy Evaluation Patient Details Name: Gerald Hall MRN: [id number] DOB: [birth year] Today's Date: 01/28/2023  History of Present Illness  Gerald Sandyhomas P Fenn is a 88 y.o. male with medical history significant for BPH, atria fib, COPD with chronic respiratory failure, CKD3.  Patient was brought to the ED with reports of a fall and subsequent left hip pain.  At baseline patient ambulates with a walker.  Today he bent over to pick something from the floor (Tissue) and fell over onto his left hip.  He otherwise has no chest pain or difficulty breathing, no dizziness.  No memory problems.  He has a caregiver that lives with him.  No cough.  Swelling to his bilateral lower extremities that is at baseline today.  He is on chronic O2.   Clinical Impression  Patient limited for functional mobility as stated below secondary to BLE weakness, fatigue and poor standing balance. Patient requires assist for LLE mobility to EOB and to pull to seated. He demonstrates good sitting tolerance and balance at EOB. Patient requires frequent cueing for proper RW use and sequencing and assist to transfer to standing. Patient demonstrates poor standing balance and requires assist along with RW. Patient with limited standing mobility and is only able to slide feet a little on floor rather than ambulate due to pain and weakness. Patient and daughter educated on precautions, frequent mobility, and exercises to perform. Patient will benefit from continued physical therapy in hospital and recommended venue below to increase strength, balance, endurance for safe ADLs and gait.        Recommendations for follow up therapy are one component of a multi-disciplinary discharge planning process, led by the attending physician.  Recommendations may be updated based on patient status, additional functional criteria and insurance authorization.  Follow Up Recommendations Can patient physically be transported by private vehicle:  No     Assistance Recommended at Discharge Frequent or constant Supervision/Assistance  Patient can return home with the following  A lot of help with walking and/or transfers;A lot of help with bathing/dressing/bathroom;Assistance with cooking/housework;Assist for transportation;Help with stairs or ramp for entrance    Equipment Recommendations None recommended by PT  Recommendations for Other Services       Functional Status Assessment Patient has had a recent decline in their functional status and demonstrates the ability to make significant improvements in function in a reasonable and predictable amount of time.     Precautions / Restrictions Precautions Precautions: Fall;Posterior Hip Precaution Comments: adduction pillow in bed Restrictions LLE Weight Bearing: Weight bearing as tolerated      Mobility  Bed Mobility Overal bed mobility: Needs Assistance Bed Mobility: Supine to Sit, Sit to Supine     Supine to sit: Mod assist, HOB elevated Sit to supine: Mod assist   General bed mobility comments: slow, labored, assist for LLE mobility and to pull to seated EOB; assist for BLE back into bed    Transfers Overall transfer level: Needs assistance Equipment used: Rolling walker (2 wheels) Transfers: Sit to/from Stand Sit to Stand: Mod assist           General transfer comment: cueing for sequencing and proper RW use, assist for weakness/pain    Ambulation/Gait Ambulation/Gait assistance: Mod assist, Max assist Gait Distance (Feet): 1 Feet Assistive device: Rolling walker (2 wheels)   Gait velocity: decreased     General Gait Details: limited to sliding feet some with frequent cueing  Stairs  Wheelchair Mobility    Modified Rankin (Stroke Patients Only)       Balance Overall balance assessment: Needs assistance Sitting-balance support: No upper extremity supported, Feet supported Sitting balance-Leahy Scale: Good Sitting balance -  Comments: seated EOB Postural control: Right lateral lean Standing balance support: Bilateral upper extremity supported, Reliant on assistive device for balance Standing balance-Leahy Scale: Poor                               Pertinent Vitals/Pain Pain Assessment Pain Assessment: Faces Faces Pain Scale: Hurts little more Pain Location: L hip Pain Descriptors / Indicators: Grimacing, Guarding Pain Intervention(s): Limited activity within patient's tolerance, Monitored during session, Repositioned    Home Living Family/patient expects to be discharged to:: Private residence Living Arrangements: Alone;Other (Comment) (Advertising copywriter) Available Help at Discharge: Personal care attendant;Available 24 hours/day (sitter) Type of Home: House Home Access: Ramped entrance       Home Layout: One level Home Equipment: Agricultural consultant (2 wheels);Rollator (4 wheels);Shower seat;BSC/3in1;Cane - single point      Prior Function Prior Level of Function : Independent/Modified Independent             Mobility Comments: independent with basic ADL, patient has a caregiver who assists with cooking, cleaning, etc but does not provide patient with physical assist       Hand Dominance        Extremity/Trunk Assessment   Upper Extremity Assessment Upper Extremity Assessment: Defer to OT evaluation    Lower Extremity Assessment Lower Extremity Assessment: Generalized weakness    Cervical / Trunk Assessment Cervical / Trunk Assessment: Kyphotic  Communication   Communication: HOH;Deaf (deaf R ear)  Cognition Arousal/Alertness: Awake/alert Behavior During Therapy: WFL for tasks assessed/performed Overall Cognitive Status: Within Functional Limits for tasks assessed                                          General Comments      Exercises     Assessment/Plan    PT Assessment Patient needs continued PT services  PT Problem List Decreased  strength;Decreased activity tolerance;Decreased balance;Decreased mobility       PT Treatment Interventions DME instruction;Therapeutic exercise;Gait training;Balance training;Stair training;Neuromuscular re-education;Functional mobility training;Therapeutic activities;Patient/family education    PT Goals (Current goals can be found in the Care Plan section)  Acute Rehab PT Goals Patient Stated Goal: return home PT Goal Formulation: With patient Time For Goal Achievement: 02/11/23 Potential to Achieve Goals: Good    Frequency Min 3X/week     Co-evaluation PT/OT/SLP Co-Evaluation/Treatment: Yes Reason for Co-Treatment: To address functional/ADL transfers PT goals addressed during session: Mobility/safety with mobility;Balance;Proper use of DME;Strengthening/ROM         AM-PAC PT "6 Clicks" Mobility  Outcome Measure Help needed turning from your back to your side while in a flat bed without using bedrails?: A Little Help needed moving from lying on your back to sitting on the side of a flat bed without using bedrails?: A Lot Help needed moving to and from a bed to a chair (including a wheelchair)?: A Lot Help needed standing up from a chair using your arms (e.g., wheelchair or bedside chair)?: A Lot Help needed to walk in hospital room?: A Lot Help needed climbing 3-5 steps with a railing? : A Lot 6 Click Score: 13  End of Session Equipment Utilized During Treatment: Gait belt;Oxygen Activity Tolerance: Patient tolerated treatment well;Patient limited by fatigue;Patient limited by pain Patient left: in bed;with call bell/phone within reach;with family/visitor present Nurse Communication: Mobility status PT Visit Diagnosis: Unsteadiness on feet (R26.81);Other abnormalities of gait and mobility (R26.89);Muscle weakness (generalized) (M62.81);Pain Pain - Right/Left: Left Pain - part of body: Hip    Time: 3557-3220 PT Time Calculation (min) (ACUTE ONLY): 36 min   Charges:    PT Evaluation $PT Eval Low Complexity: 1 Low PT Treatments $Therapeutic Activity: 8-22 mins        11:09 AM, 01/28/23 Wyman Songster PT, DPT Physical Therapist at [facility]

## 2023-01-28 NOTE — Anesthesia Postprocedure Evaluation (Signed)
Anesthesia Post Note  Patient: CHUKWUMA TORNES  Procedure(s) Performed: ARTHROPLASTY BIPOLAR HIP (HEMIARTHROPLASTY) (Left: Hip)  Patient location during evaluation: Phase II Anesthesia Type: General Level of consciousness: awake Pain management: pain level controlled Vital Signs Assessment: post-procedure vital signs reviewed and stable Respiratory status: spontaneous breathing and respiratory function stable Cardiovascular status: blood pressure returned to baseline and stable Postop Assessment: no headache and no apparent nausea or vomiting Anesthetic complications: no Comments: Late entry   No notable events documented.   Last Vitals:  Vitals:   01/28/23 0522 01/28/23 1034  BP: 134/82   Pulse: 84   Resp: 18   Temp: 36.9 C   SpO2: 95% 96%    Last Pain:  Vitals:   01/28/23 0950  TempSrc:   PainSc: 4                  Windell Norfolk

## 2023-01-28 NOTE — Patient Instructions (Signed)

## 2023-01-28 NOTE — Progress Notes (Signed)
   ORTHOPAEDIC PROGRESS NOTE  s/p Procedure(s): Left hip hemiarthroplasty  DOS: 01/27/2023  SUBJECTIVE: No issues over night.  Pain is controlled.  No numbness or tingling.  Waiting to work with PT.  OBJECTIVE: PE:  Alert and oriented, no acute distress Nasal cannula in place  Lateral left hip dressing in place; clean, dry and intact Hip abduction pillow in place Toes are warm and well perfused Sensation intact to the dorsum of the foot Active motion intact EHL/TA  Vitals:   01/27/23 2043 01/28/23 0522  BP:  134/82  Pulse:  84  Resp:  18  Temp:  98.4 F (36.9 C)  SpO2: 95% 95%      Latest Ref Rng & Units 01/28/2023    5:00 AM 01/27/2023    5:16 AM 01/26/2023    6:23 PM  CBC  WBC 4.0 - 10.5 K/uL 17.3  10.9  13.2   Hemoglobin 13.0 - 17.0 g/dL 65.7  90.3  83.3   Hematocrit 39.0 - 52.0 % 44.7  47.9  50.3   Platelets 150 - 400 K/uL 206  199  202       ASSESSMENT: Gerald Hall is a 88 y.o. male doing well postoperatively.  Waiting to work with PT/OT  PLAN: Weightbearing: WBAT LLE Insicional and dressing care: Reinforce dressings as needed Orthopedic device(s):  Hip abduction pillow at all times when in bed VTE prophylaxis: Aspirin 81mg  BID  Pain control: As needed; judicious use of narcotics Follow - up plan:  2 weeks; staples can be removed around that time.  Contact the office with any questions.   Contact information:     Kaleem Sartwell A. Dallas Schimke, MD MS [facility] [address] [place],  [place]  [address] Phone: [phone] Fax: [phone]

## 2023-01-28 NOTE — Plan of Care (Signed)
  Problem: Acute Rehab PT Goals(only PT should resolve) Goal: Patient Will Transfer Sit To/From Stand Outcome: Progressing Flowsheets (Taken 01/28/2023 1110) Patient will transfer sit to/from stand: with minimal assist Goal: Pt Will Transfer Bed To Chair/Chair To Bed Outcome: Progressing Flowsheets (Taken 01/28/2023 1110) Pt will Transfer Bed to Chair/Chair to Bed: with min assist Goal: Pt Will Ambulate Outcome: Progressing Flowsheets (Taken 01/28/2023 1110) Pt will Ambulate:  25 feet  with minimal assist  with moderate assist  with rolling walker Goal: Pt/caregiver will Perform Home Exercise Program Outcome: Progressing Flowsheets (Taken 01/28/2023 1110) Pt/caregiver will Perform Home Exercise Program:  For increased strengthening  For improved balance  With Supervision, verbal cues required/provided  11:11 AM, 01/28/23 Wyman Songster PT, DPT Physical Therapist at [facility]

## 2023-01-29 DIAGNOSIS — S72002A Fracture of unspecified part of neck of left femur, initial encounter for closed fracture: Secondary | ICD-10-CM | POA: Diagnosis not present

## 2023-01-29 LAB — BASIC METABOLIC PANEL
Anion gap: 9 (ref 5–15)
BUN: 34 mg/dL — ABNORMAL HIGH (ref 8–23)
CO2: 30 mmol/L (ref 22–32)
Calcium: 8.8 mg/dL — ABNORMAL LOW (ref 8.9–10.3)
Chloride: 101 mmol/L (ref 98–111)
Creatinine, Ser: 1.56 mg/dL — ABNORMAL HIGH (ref 0.61–1.24)
GFR, Estimated: 43 mL/min — ABNORMAL LOW (ref >60)
Glucose, Bld: 110 mg/dL — ABNORMAL HIGH (ref 70–99)
Potassium: 3.1 mmol/L — ABNORMAL LOW (ref 3.5–5.1)
Sodium: 140 mmol/L (ref 135–145)

## 2023-01-29 LAB — CBC
HCT: 42.5 % (ref 39.0–52.0)
Hemoglobin: 13.6 g/dL (ref 13.0–17.0)
MCH: 31.3 pg (ref 26.0–34.0)
MCHC: 32 g/dL (ref 30.0–36.0)
MCV: 97.7 fL (ref 80.0–100.0)
Platelets: 203 K/uL (ref 150–400)
RBC: 4.35 MIL/uL (ref 4.22–5.81)
RDW: 15.1 % (ref 11.5–15.5)
WBC: 12.8 K/uL — ABNORMAL HIGH (ref 4.0–10.5)
nRBC: 0 % (ref 0.0–0.2)

## 2023-01-29 LAB — MAGNESIUM: Magnesium: 2.4 mg/dL (ref 1.7–2.4)

## 2023-01-29 MED ORDER — POTASSIUM CHLORIDE CRYS ER 20 MEQ PO TBCR
40.0000 meq | EXTENDED_RELEASE_TABLET | Freq: Two times a day (BID) | ORAL | Status: AC
  Administered 2023-01-29 (×2): 40 meq via ORAL
  Filled 2023-01-29 (×2): qty 2

## 2023-01-29 MED ORDER — PREGABALIN 25 MG PO CAPS
25.0000 mg | ORAL_CAPSULE | Freq: Two times a day (BID) | ORAL | Status: DC
  Administered 2023-01-29: 25 mg via ORAL
  Filled 2023-01-29: qty 1

## 2023-01-29 NOTE — Progress Notes (Signed)
Went over Pressure Injury Prevention handout with patient and daughter at bedside. Pt and family member verbalized understanding, stated no further questions at this time. Handout left with family member to look over at their leisure.

## 2023-01-29 NOTE — Progress Notes (Signed)
Dr. Lenoria Farrier notified through secure chat of critical result EKG and repeat EKG performed by RT. Both copies placed in chart.

## 2023-01-29 NOTE — Progress Notes (Signed)
Physical Therapy Treatment Patient Details Name: Gerald Hall MRN: [id number] DOB: January 17, 1935 Today's Date: 01/29/2023   History of Present Illness Gerald Hall is a 88 y.o. male with medical history significant for BPH, atria fib, COPD with chronic respiratory failure, CKD3.  Patient was brought to the ED with reports of a fall and subsequent left hip pain.  At baseline patient ambulates with a walker.  Today he bent over to pick something from the floor (Tissue) and fell over onto his left hip.  He otherwise has no chest pain or difficulty breathing, no dizziness.  No memory problems.  He has a caregiver that lives with him.  No cough.  Swelling to his bilateral lower extremities that is at baseline today.  He is on chronic O2.    PT Comments    Pt tolerated today's treatment session, however requesting to and seated there ex after only 1 set of LAQ. Today's session addressed functional mobility at bed level, upright sitting activity tolerance and balance and BLE functional strengthening. Pt noted with continued reduced activity tolerance for balance in sitting with heavy right posterior lateral lean, doing well with increased encouragement from daughter and physical therapist.  Patient requiring heavy cueing and max assist this session with minimal use of bed rail.  Progressing old left lateral weight shift to ease pain with L side sitting.  Noted right lateral lean in supine and in sitting throughout session.  Continues to be limited due to weakness reduced activity tolerance balance deficits.  Patient and daughter were educated on possibility of being seen tomorrow based upon patient caseload.  Pt would continue to benefit from skilled acute physical therapy services in order to progress toward POC goals, safety/independence with functional mobility and QOL.     Recommendations for follow up therapy are one component of a multi-disciplinary discharge planning process, led by the attending  physician.  Recommendations may be updated based on patient status, additional functional criteria and insurance authorization.  Follow Up Recommendations  Can patient physically be transported by private vehicle: No    Assistance Recommended at Discharge Frequent or constant Supervision/Assistance  Patient can return home with the following A lot of help with walking and/or transfers;A lot of help with bathing/dressing/bathroom;Assistance with cooking/housework;Assist for transportation;Help with stairs or ramp for entrance   Equipment Recommendations       Recommendations for Other Services       Precautions / Restrictions Precautions Precautions: Fall;Posterior Hip Precaution Comments: adduction pillow in bed Restrictions Weight Bearing Restrictions: Yes LLE Weight Bearing: Weight bearing as tolerated     Mobility  Bed Mobility Overal bed mobility: Needs Assistance Bed Mobility: Supine to Sit, Sit to Supine     Supine to sit: Max assist Sit to supine: Max assist   General bed mobility comments: Daughter present in the room and assisting eagerly.  Slow, labored, assist for LLE mobility and to pull to seated EOB multiple rest breaks in between due to pain; assist for BLE back into bed; given trunk elevation this session, requiring increased rest breaks throughout various sections of bed mobility.  Tolerated sitting edge of bed session with LE flat, heavy posterior laterally right lean attempting to offload left hip.  Heavy cueing and constant redirection for proper hand placement on bed rail.  Supine slide for proper placement dependent x 2 with Trendelenburg assist    Transfers  Ambulation/Gait                   Stairs             Wheelchair Mobility    Modified Rankin (Stroke Patients Only)       Balance Overall balance assessment: Needs assistance Sitting-balance support: Bilateral upper extremity  supported Sitting balance-Leahy Scale: Poor Sitting balance - Comments: seated EOB; heavy posterior laterally right lean Postural control: Right lateral lean                                  Cognition Arousal/Alertness: Awake/alert Behavior During Therapy: WFL for tasks assessed/performed Overall Cognitive Status: Within Functional Limits for tasks assessed                                          Exercises General Exercises - Lower Extremity Long Arc Quad: 10 reps, AROM, Seated Heel Slides: AROM, Both, 10 reps, Supine    General Comments        Pertinent Vitals/Pain Pain Assessment Pain Score: 6  Pain Location: L hip Pain Descriptors / Indicators: Grimacing, Guarding    Home Living                          Prior Function            PT Goals (current goals can now be found in the care plan section) Acute Rehab PT Goals Patient Stated Goal: return home PT Goal Formulation: With patient Time For Goal Achievement: 02/11/23 Potential to Achieve Goals: Good Progress towards PT goals: Progressing toward goals    Frequency    Min 3X/week      PT Plan Current plan remains appropriate    Co-evaluation              AM-PAC PT "6 Clicks" Mobility   Outcome Measure  Help needed turning from your back to your side while in a flat bed without using bedrails?: A Lot Help needed moving from lying on your back to sitting on the side of a flat bed without using bedrails?: A Lot Help needed moving to and from a bed to a chair (including a wheelchair)?: A Lot Help needed standing up from a chair using your arms (e.g., wheelchair or bedside chair)?: A Lot Help needed to walk in hospital room?: A Lot Help needed climbing 3-5 steps with a railing? : A Lot 6 Click Score: 12    End of Session Equipment Utilized During Treatment: Gait belt;Oxygen Activity Tolerance: Patient tolerated treatment well;Patient limited by  fatigue;Patient limited by pain Patient left: in bed;with call bell/phone within reach;with family/visitor present Nurse Communication: Mobility status PT Visit Diagnosis: Unsteadiness on feet (R26.81);Other abnormalities of gait and mobility (R26.89);Muscle weakness (generalized) (M62.81);Pain Pain - Right/Left: Left Pain - part of body: Hip     Time: 1030-1100 PT Time Calculation (min) (ACUTE ONLY): 30 min  Charges:  $Therapeutic Exercise: 8-22 mins $Therapeutic Activity: 8-22 mins                     Nelida Meuse PT, DPT Physical Therapist with [facility] [phone] office    Nelida Meuse 01/29/2023, 11:04 AM

## 2023-01-29 NOTE — Progress Notes (Signed)
Patient alert and oriented x 4. Patient states that pain is better today. He states he is more comfortable and has not requested PRN pain meds this shift so far. Patient assisted to sitting position on side of bed to eat meals. Male purwick in place. Patient voiding. Daughter at bedside. Call bell within reach. Bed in lowest position.

## 2023-01-29 NOTE — Progress Notes (Signed)
PROGRESS NOTE    Gerald Hall  GLO:[id number] DOB: [birth year] DOA: 01/26/2023 PCP: Babs Sciara, MD   Brief Narrative:    SENG POLISHCHUK is a 88 y.o. male with medical history significant for BPH, atria fib, COPD with chronic respiratory failure, CKD3.  Patient was brought to the ED with reports of a fall and subsequent left hip pain.  At baseline patient ambulates with a walker.  Patient was admitted with closed displaced fracture of left femoral neck and orthopedics to see.  Cardiology consult for clearance completed and he underwent left hip hemiarthroplasty 4/4.  Assessment & Plan:   Principal Problem:   Closed displaced fracture of left femoral neck Active Problems:   Secondary cardiomyopathy   Chronic atrial fibrillation, unspecified   COPD (chronic obstructive pulmonary disease)   HTN (hypertension), benign   Hyperthyroidism   CKD stage 3a, GFR 45-59 ml/min   Chronic respiratory failure with hypoxia   Acute on chronic systolic heart failure   Prolonged Q-T interval on ECG  Assessment and Plan:   Closed displaced fracture of left femoral neck status post left hip hemiarthroplasty 4/4 Status post mechanical fall.  Pelvic x-ray shows acute displaced left femoral neck fracture. -Orthopedics for ORIF this afternoon -IV morphine 2 mg every 4 hourly as needed -PT/OT evaluation recommending SNF and placement is pending   Secondary cardiomyopathy Chronic bilateral 1+ pitting edema, unchanged.  Last echo shows EF of 30 to 35%, with grade 1 DD. -Continue Entresto, Coreg, Jardiance -Okay to resume torsemide and Jardiance 4/5 -BNP 253 -Chest x-ray with no acute findings  Hypokalemia Replete and reevaluate in a.m.   COPD (chronic obstructive pulmonary disease) Diffuse wheezing on exam, no cough, denies dyspnea, reports he missed 2 doses of his bronchodilators.  On 2 L chronically. -Resume DuoNebs, and home regimen   Chronic atrial fibrillation, unspecified Sinus  rhythm, rate Controlled on amiodarone and carvedilol.  Not on anticoagulation.  Follows with Dr. Cindra Eves.  QTc prolonged at 536, also with first-degree heart block with PR interval of 345-  Not new. -Resume amiodarone, carvedilol   CKD stage 3a, GFR 45-59 ml/min Creatinine stable- 1.38. -Continue Entresto   Hyperthyroidism Resume methimazole   HTN (hypertension), benign Systolic 140s to 188C. -Resume carvedilol, tamsulosin   Chronic respiratory failure with hypoxia On 2 L.  Obesity BMI 30.77    DVT prophylaxis:SCDs Code Status: DNR Family Communication: Daughter at bedside 4/6 Disposition Plan: Admit for ORIF Status is: Inpatient Remains inpatient appropriate because: Need for IV medications  Consultants:  Orthopedics Cardiology  Procedures:  None  Antimicrobials:  Anti-infectives (From admission, onward)    Start     Dose/Rate Route Frequency Ordered Stop   01/27/23 2300  ceFAZolin (ANCEF) IVPB 2g/100 mL premix        2 g 200 mL/hr over 30 Minutes Intravenous Every 8 hours 01/27/23 1819 01/28/23 1459   01/27/23 1614  vancomycin (VANCOCIN) powder  Status:  Discontinued          As needed 01/27/23 1614 01/27/23 1819   01/27/23 1200  ceFAZolin (ANCEF) IVPB 2g/100 mL premix        2 g 200 mL/hr over 30 Minutes Intravenous On call to O.R. 01/26/23 1951 01/27/23 1444       Subjective: Patient seen and evaluated today with no new acute complaints or concerns. No acute concerns or events noted overnight.  He states that his hip pain is well-controlled.  Requesting PT evaluation today.  Objective: Vitals:  01/28/23 2059 01/29/23 0531 01/29/23 0824 01/29/23 0832  BP: 124/72 128/70 104/67   Pulse: 78 81 89   Resp: (!) 22 (!) 22    Temp: 98.7 F (37.1 C) 98.7 F (37.1 C)    TempSrc: Oral Oral    SpO2: 94% 96% 96% 96%  Weight:      Height:        Intake/Output Summary (Last 24 hours) at 01/29/2023 1013 Last data filed at 01/29/2023 0452 Gross per 24 hour   Intake 240 ml  Output 1800 ml  Net -1560 ml   Filed Weights   01/26/23 1709 01/26/23 2134 01/27/23 1320  Weight: 90.7 kg 91.8 kg 91.8 kg    Examination:  General exam: Appears calm and comfortable  Respiratory system: Clear to auscultation. Respiratory effort normal.  2 L nasal cannula Cardiovascular system: S1 & S2 heard, RRR.  Gastrointestinal system: Abdomen is soft Central nervous system: Alert and awake Extremities: No edema Skin: No significant lesions noted Psychiatry: Flat affect.    Data Reviewed: I have personally reviewed following labs and imaging studies  CBC: Recent Labs  Lab 01/26/23 1823 01/27/23 0516 01/28/23 0500 01/29/23 0448  WBC 13.2* 10.9* 17.3* 12.8*  NEUTROABS 10.0*  --   --   --   HGB 16.0 15.0 14.3 13.6  HCT 50.3 47.9 44.7 42.5  MCV 98.6 99.4 97.8 97.7  PLT 202 199 206 203   Basic Metabolic Panel: Recent Labs  Lab 01/26/23 1823 01/27/23 0516 01/27/23 0550 01/28/23 0500 01/29/23 0448  NA 137 140  --  139 140  K 3.9 4.0  --  4.1 3.1*  CL 102 104  --  102 101  CO2 26 28  --  27 30  GLUCOSE 127* 107*  --  147* 110*  BUN 29* 24*  --  28* 34*  CREATININE 1.38* 1.32*  --  1.56* 1.56*  CALCIUM 9.3 9.1  --  8.9 8.8*  MG  --   --  2.4 2.6* 2.4   GFR: Estimated Creatinine Clearance: 38.1 mL/min (A) (by C-G formula based on SCr of 1.56 mg/dL (H)). Liver Function Tests: Recent Labs  Lab 01/26/23 1823  AST 16  ALT 16  ALKPHOS 95  BILITOT 0.6  PROT 7.3  ALBUMIN 3.8   No results for input(s): "LIPASE", "AMYLASE" in the last 168 hours. No results for input(s): "AMMONIA" in the last 168 hours. Coagulation Profile: No results for input(s): "INR", "PROTIME" in the last 168 hours. Cardiac Enzymes: No results for input(s): "CKTOTAL", "CKMB", "CKMBINDEX", "TROPONINI" in the last 168 hours. BNP (last 3 results) No results for input(s): "PROBNP" in the last 8760 hours. HbA1C: No results for input(s): "HGBA1C" in the last 72  hours. CBG: No results for input(s): "GLUCAP" in the last 168 hours. Lipid Profile: No results for input(s): "CHOL", "HDL", "LDLCALC", "TRIG", "CHOLHDL", "LDLDIRECT" in the last 72 hours. Thyroid Function Tests: No results for input(s): "TSH", "T4TOTAL", "FREET4", "T3FREE", "THYROIDAB" in the last 72 hours. Anemia Panel: No results for input(s): "VITAMINB12", "FOLATE", "FERRITIN", "TIBC", "IRON", "RETICCTPCT" in the last 72 hours. Sepsis Labs: No results for input(s): "PROCALCITON", "LATICACIDVEN" in the last 168 hours.  Recent Results (from the past 240 hour(s))  Surgical pcr screen     Status: None   Collection Time: 01/26/23  9:50 PM   Specimen: Nasal Mucosa; Nasal Swab  Result Value Ref Range Status   MRSA, PCR NEGATIVE NEGATIVE Final   Staphylococcus aureus NEGATIVE NEGATIVE Final    Comment: (  NOTE) The Xpert SA Assay (FDA approved for NASAL specimens in patients 86 years of age and older), is one component of a comprehensive surveillance program. It is not intended to diagnose infection nor to guide or monitor treatment. Performed at [facility], [address] [place], [place] [address]          Radiology Studies: DG HIP UNILAT WITH PELVIS 2-3 VIEWS LEFT  Result Date: 01/27/2023 CLINICAL DATA:  Postop left hip arthroplasty EXAM: DG HIP (WITH OR WITHOUT PELVIS) 2-3V LEFT COMPARISON:  Yesterday FINDINGS: Interval left hip arthroplasty. Remote right hip arthroplasty. No new fracture. IMPRESSION: Expected appearance after left hip arthroplasty. Electronically Signed   By: Jeronimo GreavesKyle  Talbot M.D.   On: 01/27/2023 17:30        Scheduled Meds:  amiodarone  100 mg Oral Daily   aspirin EC  81 mg Oral BID   budesonide (PULMICORT) nebulizer solution  0.25 mg Nebulization BID   carvedilol  3.125 mg Oral BID WC   empagliflozin  10 mg Oral Daily   finasteride  5 mg Oral Daily   fluticasone  2 spray Each Nare QHS   ipratropium-albuterol  3 mL Nebulization TID   methimazole  5 mg  Oral Daily   mirabegron ER  50 mg Oral Daily   polyethylene glycol  17 g Oral Daily   potassium chloride  40 mEq Oral BID   pregabalin  25 mg Oral BID   rOPINIRole  2 mg Oral BID   sacubitril-valsartan  1 tablet Oral BID   tamsulosin  0.4 mg Oral BID   torsemide  10 mg Oral QODAY       LOS: 3 days    Time spent: 35 minutes    Allyssa Abruzzese Hoover Brunette Alister Staver, DO [facility]  If 7PM-7AM, please contact night-coverage [email or web address] 01/29/2023, 10:13 AM

## 2023-01-29 NOTE — Progress Notes (Signed)
Morning routine EKG showed critical long Qtc , EKG repeated no critical value seen. Results of both given to nurse.

## 2023-01-29 NOTE — Progress Notes (Signed)
Went over Pressure Injury Prevention handout with patient and daughter at bedside. Pt and family member verbalized understanding, stated no further questions at this time. Handout left with family member to look over at their leisure.   Review of the students teaching and agree with note charted.

## 2023-01-30 DIAGNOSIS — S72002A Fracture of unspecified part of neck of left femur, initial encounter for closed fracture: Secondary | ICD-10-CM | POA: Diagnosis not present

## 2023-01-30 LAB — CBC
HCT: 41 % (ref 39.0–52.0)
Hemoglobin: 12.7 g/dL — ABNORMAL LOW (ref 13.0–17.0)
MCH: 30.8 pg (ref 26.0–34.0)
MCHC: 31 g/dL (ref 30.0–36.0)
MCV: 99.5 fL (ref 80.0–100.0)
Platelets: 193 10*3/uL (ref 150–400)
RBC: 4.12 MIL/uL — ABNORMAL LOW (ref 4.22–5.81)
RDW: 14.9 % (ref 11.5–15.5)
WBC: 11.3 10*3/uL — ABNORMAL HIGH (ref 4.0–10.5)
nRBC: 0 % (ref 0.0–0.2)

## 2023-01-30 LAB — BASIC METABOLIC PANEL
Anion gap: 8 (ref 5–15)
BUN: 37 mg/dL — ABNORMAL HIGH (ref 8–23)
CO2: 29 mmol/L (ref 22–32)
Calcium: 8.9 mg/dL (ref 8.9–10.3)
Chloride: 102 mmol/L (ref 98–111)
Creatinine, Ser: 1.45 mg/dL — ABNORMAL HIGH (ref 0.61–1.24)
GFR, Estimated: 47 mL/min — ABNORMAL LOW (ref >60)
Glucose, Bld: 107 mg/dL — ABNORMAL HIGH (ref 70–99)
Potassium: 4 mmol/L (ref 3.5–5.1)
Sodium: 139 mmol/L (ref 135–145)

## 2023-01-30 LAB — MAGNESIUM: Magnesium: 2.5 mg/dL — ABNORMAL HIGH (ref 1.7–2.4)

## 2023-01-30 MED ORDER — ROPINIROLE HCL 1 MG PO TABS
2.0000 mg | ORAL_TABLET | Freq: Two times a day (BID) | ORAL | Status: DC
  Administered 2023-01-30 – 2023-02-03 (×9): 2 mg via ORAL
  Filled 2023-01-30 (×9): qty 2

## 2023-01-30 MED ORDER — SACUBITRIL-VALSARTAN 24-26 MG PO TABS
1.0000 | ORAL_TABLET | Freq: Two times a day (BID) | ORAL | Status: DC
  Administered 2023-01-30 – 2023-02-01 (×5): 1 via ORAL
  Filled 2023-01-30 (×5): qty 1

## 2023-01-30 MED ORDER — PREGABALIN 25 MG PO CAPS
25.0000 mg | ORAL_CAPSULE | Freq: Two times a day (BID) | ORAL | Status: DC
  Administered 2023-01-30 – 2023-01-31 (×4): 25 mg via ORAL
  Filled 2023-01-30 (×4): qty 1

## 2023-01-30 MED ORDER — DM-GUAIFENESIN ER 30-600 MG PO TB12
1.0000 | ORAL_TABLET | Freq: Every day | ORAL | Status: DC
  Administered 2023-01-30 – 2023-02-04 (×6): 1 via ORAL
  Filled 2023-01-30 (×6): qty 1

## 2023-01-30 MED ORDER — TRAMADOL HCL 50 MG PO TABS
50.0000 mg | ORAL_TABLET | Freq: Three times a day (TID) | ORAL | Status: DC | PRN
  Administered 2023-01-31 – 2023-02-04 (×6): 50 mg via ORAL
  Filled 2023-01-30 (×6): qty 1

## 2023-01-30 NOTE — Progress Notes (Signed)
Patient had long QTc complex critical on first morning EKG,  Repeat EKG showed 1st degree AV block. Results given to nurse.

## 2023-01-30 NOTE — Progress Notes (Signed)
PROGRESS NOTE    Gerald Sandyhomas P Tena  ONG:[id number] DOB: 11-17-1934 DOA: 01/26/2023 PCP: Babs SciaraLuking, Scott A, MD   Brief Narrative:    Gerald Hall is a 88 y.o. male with medical history significant for BPH, atria fib, COPD with chronic respiratory failure, CKD3.  Patient was brought to the ED with reports of a fall and subsequent left hip pain.  At baseline patient ambulates with a walker.  Patient was admitted with closed displaced fracture of left femoral neck and orthopedics to see.  Cardiology consult for clearance completed and he underwent left hip hemiarthroplasty 4/4.  Assessment & Plan:   Principal Problem:   Closed displaced fracture of left femoral neck Active Problems:   Secondary cardiomyopathy   Chronic atrial fibrillation, unspecified   COPD (chronic obstructive pulmonary disease)   HTN (hypertension), benign   Hyperthyroidism   CKD stage 3a, GFR 45-59 ml/min   Chronic respiratory failure with hypoxia   Acute on chronic systolic heart failure   Prolonged Q-T interval on ECG  Assessment and Plan:   Closed displaced fracture of left femoral neck status post left hip hemiarthroplasty 4/4 Status post mechanical fall.  Pelvic x-ray shows acute displaced left femoral neck fracture. -Orthopedics for ORIF this afternoon -IV morphine 2 mg every 4 hourly as needed -PT/OT evaluation recommending SNF and placement is pending   Secondary cardiomyopathy Chronic bilateral 1+ pitting edema, unchanged.  Last echo shows EF of 30 to 35%, with grade 1 DD. -Continue Entresto, Coreg, Jardiance -Okay to resume torsemide and Jardiance 4/5 -BNP 253 -Chest x-ray with no acute findings   COPD (chronic obstructive pulmonary disease) Diffuse wheezing on exam, no cough, denies dyspnea, reports he missed 2 doses of his bronchodilators.  On 2 L chronically. -Resume DuoNebs, and home regimen   Chronic atrial fibrillation, unspecified Sinus rhythm, rate Controlled on amiodarone and carvedilol.   Not on anticoagulation.  Follows with Dr. Cindra EvesMacdowell.  QTc prolonged at 536, also with first-degree heart block with PR interval of 345-  Not new. -Resume amiodarone, carvedilol   CKD stage 3a, GFR 45-59 ml/min Creatinine stable- 1.38. -Continue Entresto   Hyperthyroidism Resume methimazole   HTN (hypertension), benign Systolic 140s to 440N180s. -Resume carvedilol, tamsulosin   Chronic respiratory failure with hypoxia On 2 L.  Obesity BMI 30.77    DVT prophylaxis:SCDs Code Status: DNR Family Communication: Daughter at bedside 4/7 Disposition Plan: Admit for ORIF Status is: Inpatient Remains inpatient appropriate because: Need for IV medications  Consultants:  Orthopedics Cardiology  Procedures:  None  Antimicrobials:  Anti-infectives (From admission, onward)    Start     Dose/Rate Route Frequency Ordered Stop   01/27/23 2300  ceFAZolin (ANCEF) IVPB 2g/100 mL premix        2 g 200 mL/hr over 30 Minutes Intravenous Every 8 hours 01/27/23 1819 01/28/23 1459   01/27/23 1614  vancomycin (VANCOCIN) powder  Status:  Discontinued          As needed 01/27/23 1614 01/27/23 1819   01/27/23 1200  ceFAZolin (ANCEF) IVPB 2g/100 mL premix        2 g 200 mL/hr over 30 Minutes Intravenous On call to O.R. 01/26/23 1951 01/27/23 1444       Subjective: Patient seen and evaluated today with no new acute complaints or concerns. No acute concerns or events noted overnight.  Requests adjustment to his home medications with Requip closer to bedtime.  Objective: Vitals:   01/29/23 1947 01/29/23 2026 01/30/23 [id number] 01/30/23 [id number]  BP:  117/62 (!) 114/53   Pulse:  73 78   Resp:  18 18   Temp:  98.6 F (37 C) 98.8 F (37.1 C)   TempSrc:  Oral Oral   SpO2: 96% 99% 96% 92%  Weight:      Height:        Intake/Output Summary (Last 24 hours) at 01/30/2023 0937 Last data filed at 01/29/2023 2100 Gross per 24 hour  Intake 240 ml  Output 950 ml  Net -710 ml   Filed Weights   01/26/23  1709 01/26/23 2134 01/27/23 1320  Weight: 90.7 kg 91.8 kg 91.8 kg    Examination:  General exam: Appears calm and comfortable  Respiratory system: Clear to auscultation. Respiratory effort normal.  2 L nasal cannula Cardiovascular system: S1 & S2 heard, RRR.  Gastrointestinal system: Abdomen is soft Central nervous system: Alert and awake Extremities: No edema Skin: No significant lesions noted Psychiatry: Flat affect.    Data Reviewed: I have personally reviewed following labs and imaging studies  CBC: Recent Labs  Lab 01/26/23 1823 01/27/23 0516 01/28/23 0500 01/29/23 0448 01/30/23 0422  WBC 13.2* 10.9* 17.3* 12.8* 11.3*  NEUTROABS 10.0*  --   --   --   --   HGB 16.0 15.0 14.3 13.6 12.7*  HCT 50.3 47.9 44.7 42.5 41.0  MCV 98.6 99.4 97.8 97.7 99.5  PLT 202 199 206 203 193   Basic Metabolic Panel: Recent Labs  Lab 01/26/23 1823 01/27/23 0516 01/27/23 0550 01/28/23 0500 01/29/23 0448 01/30/23 0422  NA 137 140  --  139 140 139  K 3.9 4.0  --  4.1 3.1* 4.0  CL 102 104  --  102 101 102  CO2 26 28  --  27 30 29   GLUCOSE 127* 107*  --  147* 110* 107*  BUN 29* 24*  --  28* 34* 37*  CREATININE 1.38* 1.32*  --  1.56* 1.56* 1.45*  CALCIUM 9.3 9.1  --  8.9 8.8* 8.9  MG  --   --  2.4 2.6* 2.4 2.5*   GFR: Estimated Creatinine Clearance: 41 mL/min (A) (by C-G formula based on SCr of 1.45 mg/dL (H)). Liver Function Tests: Recent Labs  Lab 01/26/23 1823  AST 16  ALT 16  ALKPHOS 95  BILITOT 0.6  PROT 7.3  ALBUMIN 3.8   No results for input(s): "LIPASE", "AMYLASE" in the last 168 hours. No results for input(s): "AMMONIA" in the last 168 hours. Coagulation Profile: No results for input(s): "INR", "PROTIME" in the last 168 hours. Cardiac Enzymes: No results for input(s): "CKTOTAL", "CKMB", "CKMBINDEX", "TROPONINI" in the last 168 hours. BNP (last 3 results) No results for input(s): "PROBNP" in the last 8760 hours. HbA1C: No results for input(s): "HGBA1C" in the  last 72 hours. CBG: No results for input(s): "GLUCAP" in the last 168 hours. Lipid Profile: No results for input(s): "CHOL", "HDL", "LDLCALC", "TRIG", "CHOLHDL", "LDLDIRECT" in the last 72 hours. Thyroid Function Tests: No results for input(s): "TSH", "T4TOTAL", "FREET4", "T3FREE", "THYROIDAB" in the last 72 hours. Anemia Panel: No results for input(s): "VITAMINB12", "FOLATE", "FERRITIN", "TIBC", "IRON", "RETICCTPCT" in the last 72 hours. Sepsis Labs: No results for input(s): "PROCALCITON", "LATICACIDVEN" in the last 168 hours.  Recent Results (from the past 240 hour(s))  Surgical pcr screen     Status: None   Collection Time: 01/26/23  9:50 PM   Specimen: Nasal Mucosa; Nasal Swab  Result Value Ref Range Status   MRSA, PCR NEGATIVE NEGATIVE Final  Staphylococcus aureus NEGATIVE NEGATIVE Final    Comment: (NOTE) The Xpert SA Assay (FDA approved for NASAL specimens in patients 23 years of age and older), is one component of a comprehensive surveillance program. It is not intended to diagnose infection nor to guide or monitor treatment. Performed at [facility], [address], [place], [place] [address]          Radiology Studies: No results found.      Scheduled Meds:  amiodarone  100 mg Oral Daily   aspirin EC  81 mg Oral BID   budesonide (PULMICORT) nebulizer solution  0.25 mg Nebulization BID   carvedilol  3.125 mg Oral BID WC   dextromethorphan-guaiFENesin  1 tablet Oral Daily   empagliflozin  10 mg Oral Daily   finasteride  5 mg Oral Daily   fluticasone  2 spray Each Nare QHS   ipratropium-albuterol  3 mL Nebulization TID   methimazole  5 mg Oral Daily   mirabegron ER  50 mg Oral Daily   polyethylene glycol  17 g Oral Daily   pregabalin  25 mg Oral BID   rOPINIRole  2 mg Oral BID   sacubitril-valsartan  1 tablet Oral BID   tamsulosin  0.4 mg Oral BID   torsemide  10 mg Oral QODAY       LOS: 4 days    Time spent: 35 minutes    Dayshaun Whobrey Hoover Brunette,  DO [facility]  If 7PM-7AM, please contact night-coverage [email or web address] 01/30/2023, 9:37 AM

## 2023-01-31 DIAGNOSIS — S72002A Fracture of unspecified part of neck of left femur, initial encounter for closed fracture: Secondary | ICD-10-CM | POA: Diagnosis not present

## 2023-01-31 LAB — CBC
HCT: 40.5 % (ref 39.0–52.0)
Hemoglobin: 12.8 g/dL — ABNORMAL LOW (ref 13.0–17.0)
MCH: 31.5 pg (ref 26.0–34.0)
MCHC: 31.6 g/dL (ref 30.0–36.0)
MCV: 99.8 fL (ref 80.0–100.0)
Platelets: 209 10*3/uL (ref 150–400)
RBC: 4.06 MIL/uL — ABNORMAL LOW (ref 4.22–5.81)
RDW: 14.7 % (ref 11.5–15.5)
WBC: 10.7 10*3/uL — ABNORMAL HIGH (ref 4.0–10.5)
nRBC: 0 % (ref 0.0–0.2)

## 2023-01-31 LAB — BASIC METABOLIC PANEL
Anion gap: 8 (ref 5–15)
BUN: 44 mg/dL — ABNORMAL HIGH (ref 8–23)
CO2: 28 mmol/L (ref 22–32)
Calcium: 9 mg/dL (ref 8.9–10.3)
Chloride: 101 mmol/L (ref 98–111)
Creatinine, Ser: 1.59 mg/dL — ABNORMAL HIGH (ref 0.61–1.24)
GFR, Estimated: 42 mL/min — ABNORMAL LOW (ref >60)
Glucose, Bld: 110 mg/dL — ABNORMAL HIGH (ref 70–99)
Potassium: 4 mmol/L (ref 3.5–5.1)
Sodium: 137 mmol/L (ref 135–145)

## 2023-01-31 LAB — MAGNESIUM: Magnesium: 2.7 mg/dL — ABNORMAL HIGH (ref 1.7–2.4)

## 2023-01-31 MED ORDER — CLONAZEPAM 0.5 MG PO TABS: 0.5000 mg | ORAL_TABLET | Freq: Every evening | ORAL | 0 refills | Status: DC | PRN

## 2023-01-31 MED ORDER — ASPIRIN 81 MG PO TBEC: 81.0000 mg | DELAYED_RELEASE_TABLET | Freq: Two times a day (BID) | ORAL | 0 refills | Status: DC

## 2023-01-31 MED ORDER — TORSEMIDE 10 MG PO TABS: 10.0000 mg | ORAL_TABLET | Freq: Every day | ORAL | 0 refills | Status: DC

## 2023-01-31 MED ORDER — TRAMADOL HCL 50 MG PO TABS: 50.0000 mg | ORAL_TABLET | Freq: Three times a day (TID) | ORAL | 0 refills | Status: DC | PRN

## 2023-01-31 MED ORDER — OXYCODONE HCL 5 MG PO TABS: 5.0000 mg | ORAL_TABLET | Freq: Three times a day (TID) | ORAL | 0 refills | Status: DC | PRN

## 2023-01-31 MED ORDER — SORBITOL 70 % SOLN
960.0000 mL | TOPICAL_OIL | Freq: Once | ORAL | Status: AC
  Administered 2023-01-31: 960 mL via RECTAL
  Filled 2023-01-31: qty 240

## 2023-01-31 MED ORDER — TORSEMIDE 20 MG PO TABS
10.0000 mg | ORAL_TABLET | Freq: Every day | ORAL | Status: DC
  Administered 2023-01-31: 10 mg via ORAL
  Filled 2023-01-31: qty 1

## 2023-01-31 MED ORDER — ROPINIROLE HCL 2 MG PO TABS: 2.0000 mg | ORAL_TABLET | Freq: Two times a day (BID) | ORAL | 0 refills | Status: DC

## 2023-01-31 MED ORDER — PREGABALIN 25 MG PO CAPS: 25.0000 mg | ORAL_CAPSULE | Freq: Two times a day (BID) | ORAL | 2 refills | Status: DC

## 2023-01-31 MED ORDER — ONDANSETRON HCL 4 MG/2ML IJ SOLN
4.0000 mg | Freq: Four times a day (QID) | INTRAMUSCULAR | Status: DC | PRN
  Administered 2023-01-31: 4 mg via INTRAVENOUS
  Filled 2023-01-31: qty 2

## 2023-01-31 NOTE — Progress Notes (Signed)
Report given to Marylene Land at [facility].

## 2023-01-31 NOTE — Progress Notes (Signed)
Physical Therapy Treatment Patient Details Name: Gerald Hall MRN: [id number] DOB: [birth year] Today's Date: 01/31/2023   History of Present Illness Gerald Hall is a 88 y.o. male with medical history significant for BPH, atria fib, COPD with chronic respiratory failure, CKD3.  Patient was brought to the ED with reports of a fall and subsequent left hip pain.  At baseline patient ambulates with a walker.  Today he bent over to pick something from the floor (Tissue) and fell over onto his left hip.  He otherwise has no chest pain or difficulty breathing, no dizziness.  No memory problems.  He has a caregiver that lives with him.  No cough.  Swelling to his bilateral lower extremities that is at baseline today.  He is on chronic O2.    PT Comments    Patient agreeable and motivated for therapy.  Patient demonstrates slow labored movement for sitting up at bedside with difficulty moving LLE and propping up on elbows during supine to sitting secondary to left hip pain. Patient had frequent leaning laterally to the right after sitting up at bedside, able to self correct after verbal/tactile cueing demonstrating fair/good return for completing for completing BLE ROM/strengthening exercises and limited to a few shuffling side steps to transfer to chair due to poor tolerance for weightbearing on LLE and flexed trunk leaning over walker.  Patient tolerated sitting up in chair after therapy with his daughter present in room.  Patient will benefit from continued skilled physical therapy in hospital and recommended venue below to increase strength, balance, endurance for safe ADLs and gait.       Recommendations for follow up therapy are one component of a multi-disciplinary discharge planning process, led by the attending physician.  Recommendations may be updated based on patient status, additional functional criteria and insurance authorization.  Follow Up Recommendations  Can patient physically be  transported by private vehicle: No    Assistance Recommended at Discharge Set up Supervision/Assistance  Patient can return home with the following A lot of help with walking and/or transfers;A lot of help with bathing/dressing/bathroom;Assistance with cooking/housework;Assist for transportation;Help with stairs or ramp for entrance   Equipment Recommendations  None recommended by PT    Recommendations for Other Services       Precautions / Restrictions Precautions Precautions: Fall;Posterior Hip Precaution Comments: adduction pillow in bed Restrictions Weight Bearing Restrictions: Yes LLE Weight Bearing: Weight bearing as tolerated     Mobility  Bed Mobility Overal bed mobility: Needs Assistance Bed Mobility: Supine to Sit     Supine to sit: Max assist, Mod assist     General bed mobility comments: slow labored movement with most diffiuclty attempting to prop up on elbows, required Mod/max assist for pulling patient to sitting from upper back    Transfers Overall transfer level: Needs assistance Equipment used: Rolling walker (2 wheels) Transfers: Sit to/from Stand, Bed to chair/wheelchair/BSC Sit to Stand: Mod assist   Step pivot transfers: Mod assist, Max assist       General transfer comment: unsteady labored movement with difficulty moving LLE due to pain/weakness    Ambulation/Gait Ambulation/Gait assistance: Mod assist, Max assist Gait Distance (Feet): 3 Feet Assistive device: Rolling walker (2 wheels) Gait Pattern/deviations: Decreased step length - right, Decreased step length - left, Decreased stance time - left, Decreased stride length, Antalgic, Shuffle, Trunk flexed Gait velocity: slow     General Gait Details: limited to a few shuffling side steps with poor carryover for advancing LLE due  to increased pain/weakness   Stairs             Wheelchair Mobility    Modified Rankin (Stroke Patients Only)       Balance Overall balance  assessment: Needs assistance Sitting-balance support: Feet supported, No upper extremity supported Sitting balance-Leahy Scale: Poor Sitting balance - Comments: fair/poor seated at EOB Postural control: Right lateral lean Standing balance support: Reliant on assistive device for balance, During functional activity, Bilateral upper extremity supported Standing balance-Leahy Scale: Poor Standing balance comment: using RW                            Cognition Arousal/Alertness: Awake/alert Behavior During Therapy: WFL for tasks assessed/performed Overall Cognitive Status: Within Functional Limits for tasks assessed                                          Exercises General Exercises - Lower Extremity Long Arc Quad: 10 reps, AROM, Seated, Strengthening, Both Hip Flexion/Marching: Seated, AROM, Strengthening, Both, 10 reps Toe Raises: AROM, Seated, Strengthening, Both, 10 reps Heel Raises: Seated, AROM, Strengthening, Both, 10 reps    General Comments        Pertinent Vitals/Pain Pain Assessment Pain Assessment: 0-10 Pain Score: 6  Pain Location: L hip Pain Descriptors / Indicators: Grimacing, Guarding, Sore Pain Intervention(s): Limited activity within patient's tolerance, Monitored during session, Premedicated before session, Repositioned    Home Living                          Prior Function            PT Goals (current goals can now be found in the care plan section) Acute Rehab PT Goals Patient Stated Goal: return home after rehab PT Goal Formulation: With patient/family Time For Goal Achievement: 02/11/23 Potential to Achieve Goals: Good Progress towards PT goals: Progressing toward goals    Frequency    Min 3X/week      PT Plan Current plan remains appropriate    Co-evaluation              AM-PAC PT "6 Clicks" Mobility   Outcome Measure  Help needed turning from your back to your side while in a flat bed  without using bedrails?: A Lot Help needed moving from lying on your back to sitting on the side of a flat bed without using bedrails?: A Lot Help needed moving to and from a bed to a chair (including a wheelchair)?: A Lot Help needed standing up from a chair using your arms (e.g., wheelchair or bedside chair)?: A Lot Help needed to walk in hospital room?: A Lot Help needed climbing 3-5 steps with a railing? : Total 6 Click Score: 11    End of Session Equipment Utilized During Treatment: Oxygen Activity Tolerance: Patient tolerated treatment well;Patient limited by fatigue;Patient limited by pain Patient left: in chair;with call bell/phone within reach;with family/visitor present Nurse Communication: Mobility status PT Visit Diagnosis: Unsteadiness on feet (R26.81);Other abnormalities of gait and mobility (R26.89);Muscle weakness (generalized) (M62.81);Pain Pain - Right/Left: Left Pain - part of body: Hip     Time: [id number] PT Time Calculation (min) (ACUTE ONLY): 30 min  Charges:  $Therapeutic Exercise: 8-22 mins $Therapeutic Activity: 8-22 mins  12:26 PM, 01/31/23 Ocie Bob, MPT Physical Therapist with [facility] 336 [phone] office [phone] mobile phone

## 2023-01-31 NOTE — Plan of Care (Signed)

## 2023-01-31 NOTE — TOC Transition Note (Signed)
Transition of Care [facility]) - CM/SW Discharge Note   Patient Details  Name: Gerald Hall MRN: [id number] Date of Birth: 02-12-1935  [facility] CM/SW Contact:  Karn Cassis, LCSW Phone Number: 01/31/2023, 1:39 PM   Clinical Narrative: Bed accepted at [facility] in [place]. D/C today. Authorization received. Pt's daughters are agreeable. LCSW spoke with Tiffany in admissions at [facility]. Facility is ready for pt. D/C summary sent to SNF. Pt will need EMS per physical therapist and daughters. LCSW will arrange. RN given number to call report.       Final next level of care: Skilled Nursing Facility Barriers to Discharge: Barriers Resolved   Patient Goals and CMS Choice   Choice offered to / list presented to : Adult Children  Discharge Placement     Existing PASRR number confirmed : 01/31/23          Patient chooses bed at: [facility] Patient to be transferred to facility by: [facility] Name of family member notified: Maralyn Sago- daughter Patient and family notified of of transfer: 01/31/23  Discharge Plan and Services Additional resources added to the After Visit Summary for   In-house Referral: Clinical Social Work                                   Social Determinants of Health (SDOH) Interventions SDOH Screenings   Food Insecurity: No Food Insecurity (01/26/2023)  Housing: Low Risk  (01/26/2023)  Transportation Needs: No Transportation Needs (01/26/2023)  Utilities: Not At Risk (01/26/2023)  Alcohol Screen: Low Risk  (03/16/2022)  Depression (PHQ2-9): Low Risk  (01/12/2023)  Financial Resource Strain: Low Risk  (03/16/2022)  Physical Activity: Sufficiently Active (03/16/2022)  Social Connections: Moderately Integrated (03/16/2022)  Stress: No Stress Concern Present (03/16/2022)  Tobacco Use: High Risk (01/27/2023)     Readmission Risk Interventions     No data to display

## 2023-01-31 NOTE — Progress Notes (Signed)
Message left for return call to [employer].

## 2023-01-31 NOTE — Progress Notes (Signed)
Patient experienced some vomiting and dry heaving, MD ordered zofran and wanted to check back. Patient still feeling nauseous an hour later. MD notified and lm for [facility] that patient would not be there tonight.

## 2023-01-31 NOTE — Progress Notes (Signed)
   ORTHOPAEDIC PROGRESS NOTE  s/p Procedure(s): Left hip hemiarthroplasty  DOS: 01/27/2023  SUBJECTIVE: Continues to do well.  Pain is controlled.  He has been limited with therapy, but is working with a Paramedic.  Plan for SNF.  He is waiting approval.  OBJECTIVE: PE:  Alert and oriented, no acute distress Nasal cannula in place  Lateral left hip dressing in place; clean, dry and intact Toes are warm and well perfused Sensation intact to the dorsum of the foot Active motion intact EHL/TA  Vitals:   01/30/23 2055 01/31/23 0500  BP: (!) 117/58 (!) 105/56  Pulse: 77 78  Resp: 19 18  Temp: 98.5 F (36.9 C) 98.4 F (36.9 C)  SpO2: 97% 96%      Latest Ref Rng & Units 01/31/2023    4:39 AM 01/30/2023    4:22 AM 01/29/2023    4:48 AM  CBC  WBC 4.0 - 10.5 K/uL 10.7  11.3  12.8   Hemoglobin 13.0 - 17.0 g/dL 60.1  09.3  23.5   Hematocrit 39.0 - 52.0 % 40.5  41.0  42.5   Platelets 150 - 400 K/uL 209  193  203       ASSESSMENT: Gerald Hall is a 88 y.o. male doing well postoperatively.  Stable.  Dispo pending.  Plan for SNF.  PLAN: Weightbearing: WBAT LLE Insicional and dressing care: Reinforce dressings as needed Orthopedic device(s):  Hip abduction pillow at all times when in bed VTE prophylaxis: Aspirin 81mg  BID  Pain control: As needed; judicious use of narcotics Follow - up plan:  2 weeks; staples can be removed around that time.  Contact the office with any questions.   Contact information:     Amoura Ransier A. Dallas Schimke, MD MS [facility] [address] [place],  [place]  [address] Phone: [phone] Fax: [phone]

## 2023-01-31 NOTE — Discharge Summary (Signed)
Physician Discharge Summary  Gerald Hall ZOX:[id number] DOB: 1935/02/04 DOA: 01/26/2023  PCP: Babs SciaraLuking, Scott A, MD  Admit date: 01/26/2023  Discharge date: 01/31/2023  Admitted From:Home  Disposition:  SNF  Recommendations for Outpatient Follow-up:  Follow up with PCP in 1-2 weeks Please obtain BMP/CBC in one week Continue aspirin 81 mg twice daily as recommended for DVT prophylaxis Continue other medications as noted below Follow-up with orthopedics as recommended in 2 weeks and staples may be removed in 2 weeks  Home Health: None  Equipment/Devices: None  Discharge Condition:Stable  CODE STATUS: DNR  Diet recommendation: Heart Healthy  Brief/Interim Summary:  Gerald Sandyhomas P Sheeley is a 88 y.o. male with medical history significant for BPH, atrial fib, COPD with chronic respiratory failure, CKD3.  Patient was brought to the ED with reports of a fall and subsequent left hip pain.  At baseline patient ambulates with a walker.  Patient was admitted with closed displaced fracture of left femoral neck and orthopedics to see.  Cardiology consult for clearance completed and he underwent left hip hemiarthroplasty 4/4.  He has done well postoperatively and is in stable condition for discharge today after having a weight for several days for SNF placement.  No other acute events or concerns noted throughout the course of this hospitalization.  Discharge Diagnoses:  Principal Problem:   Closed displaced fracture of left femoral neck Active Problems:   Secondary cardiomyopathy   Chronic atrial fibrillation, unspecified   COPD (chronic obstructive pulmonary disease)   HTN (hypertension), benign   Hyperthyroidism   CKD stage 3a, GFR 45-59 ml/min   Chronic respiratory failure with hypoxia   Acute on chronic systolic heart failure   Prolonged Q-T interval on ECG  Principal discharge diagnosis: Closed, displaced fracture of left femoral neck status post left hip hemiarthroplasty 4/4.  Discharge  Instructions  Discharge Instructions     Ambulatory referral to Orthopedics   Complete by: As directed    Diet - low sodium heart healthy   Complete by: As directed    Increase activity slowly   Complete by: As directed       Allergies as of 01/31/2023       Reactions   Cephalexin Diarrhea   And general weakness   Penicillins Other (See Comments)   GI UPSET        Medication List     TAKE these medications    acetaminophen 500 MG tablet Commonly known as: TYLENOL Take 1,000 mg by mouth every 6 (six) hours as needed for mild pain or headache.   albuterol 108 (90 Base) MCG/ACT inhaler Commonly known as: VENTOLIN HFA Inhale 2 puffs into the lungs every 4 (four) hours as needed for wheezing.   amiodarone 200 MG tablet Commonly known as: PACERONE Take 1 tablet (200 mg total) by mouth daily.   ascorbic acid 500 MG tablet Commonly known as: VITAMIN C Take 500 mg by mouth daily.   aspirin EC 81 MG tablet Take 1 tablet (81 mg total) by mouth 2 (two) times daily. Swallow whole. What changed: when to take this   budesonide-formoterol 160-4.5 MCG/ACT inhaler Commonly known as: Symbicort Inhale 2 puffs into the lungs 2 (two) times daily.   calcium carbonate 600 MG Tabs tablet Commonly known as: OS-CAL Take 600 mg by mouth daily with breakfast.   carvedilol 3.125 MG tablet Commonly known as: COREG Take 1 tablet (3.125 mg total) by mouth 2 (two) times daily with a meal.   cholecalciferol 10 MCG (400  UNIT) Tabs tablet Commonly known as: VITAMIN D3 Take 1,000 Units by mouth daily.   clonazePAM 0.5 MG tablet Commonly known as: KLONOPIN Take 1 tablet (0.5 mg total) by mouth at bedtime as needed (insomnia).   Entresto 24-26 MG Generic drug: sacubitril-valsartan Take 1 tablet by mouth 2 (two) times daily.   finasteride 5 MG tablet Commonly known as: PROSCAR TAKE ONE TABLET BY MOUTH EVERY DAY   fluticasone 50 MCG/ACT nasal spray Commonly known as: FLONASE Place  2 sprays into both nostrils at bedtime.   ipratropium-albuterol 0.5-2.5 (3) MG/3ML Soln Commonly known as: DUONEB Take 3 mLs by nebulization every 4 (four) hours as needed. What changed: reasons to take this   Jardiance 10 MG Tabs tablet Generic drug: empagliflozin TAKE ONE TABLET BY MOUTH ONCE DAILY BEFORE BREAKFAST What changed: See the new instructions.   methimazole 5 MG tablet Commonly known as: TAPAZOLE TAKE ONE TABLET BY MOUTH EVERY DAY   mirabegron ER 50 MG Tb24 tablet Commonly known as: MYRBETRIQ Take 50 mg by mouth daily.   Mucinex DM 30-600 MG Tb12 Take 1 tablet by mouth daily.   oxyCODONE 5 MG immediate release tablet Commonly known as: Roxicodone Take 1 tablet (5 mg total) by mouth every 8 (eight) hours as needed for severe pain or breakthrough pain.   OXYGEN Inhale 2 L into the lungs continuous.   oxymetazoline 0.05 % nasal spray Commonly known as: AFRIN Place 1 spray into both nostrils at bedtime.   polyethylene glycol 17 g packet Commonly known as: MIRALAX / GLYCOLAX Take 17 g by mouth daily as needed for mild constipation.   polyvinyl alcohol 1.4 % ophthalmic solution Commonly known as: LIQUIFILM TEARS Place 1 drop into both eyes as needed for dry eyes.   potassium chloride 10 MEQ tablet Commonly known as: KLOR-CON M TAKE 1 TABLET BY MOUTH TWICE DAILY   pregabalin 25 MG capsule Commonly known as: Lyrica Take 1 capsule (25 mg total) by mouth 2 (two) times daily.   rOPINIRole 2 MG tablet Commonly known as: REQUIP Take 1 tablet (2 mg total) by mouth 2 (two) times daily. What changed:  how much to take how to take this when to take this additional instructions   tamsulosin 0.4 MG Caps capsule Commonly known as: FLOMAX TAKE (1) CAPSULE BY MOUTH TWICE DAILY. What changed: See the new instructions.   torsemide 10 MG tablet Commonly known as: DEMADEX Take 1 tablet (10 mg total) by mouth daily. Start taking on: February 01, 2023 What changed:   medication strength when to take this   traMADol 50 MG tablet Commonly known as: ULTRAM Take 1 tablet (50 mg total) by mouth 3 (three) times daily as needed for moderate pain or severe pain. What changed:  how much to take how to take this when to take this reasons to take this additional instructions   ZINC ACETATE PO Take 1 tablet by mouth daily.        Follow-up Information     Luking, Jonna Coup, MD. Go in 4 week(s).   Specialty: Family Medicine Contact information: [address] [place] [address] [phone]         Oliver Barre, MD. Go in 2 week(s).   Specialties: Orthopedic Surgery, Sports Medicine Contact information: [address] [place] [address] [phone]                Allergies  Allergen Reactions   Cephalexin Diarrhea    And general  weakness   Penicillins Other (See Comments)    GI UPSET    Consultations: Orthopedics Cardiology   Procedures/Studies: DG HIP UNILAT WITH PELVIS 2-3 VIEWS LEFT  Result Date: 01/27/2023 CLINICAL DATA:  Postop left hip arthroplasty EXAM: DG HIP (WITH OR WITHOUT PELVIS) 2-3V LEFT COMPARISON:  Yesterday FINDINGS: Interval left hip arthroplasty. Remote right hip arthroplasty. No new fracture. IMPRESSION: Expected appearance after left hip arthroplasty. Electronically Signed   By: Jeronimo Greaves M.D.   On: 01/27/2023 17:30   DG CHEST PORT 1 VIEW  Result Date: 01/26/2023 CLINICAL DATA:  [id number], preoperative chest x-ray for left femoral neck fracture fixation. EXAM: PORTABLE CHEST 1 VIEW COMPARISON:  Chest CT no contrast 11/04/2022 FINDINGS: The lungs are clear of infiltrates. The sulci are sharp. Stable mediastinum with mild aortic calcification and tortuosity. There is mild cardiomegaly without CHF. C5-6 ACDF plating is again shown. Moderate thoracic spondylosis. IMPRESSION: 1. No acute cardiopulmonary findings. 2. Mild cardiomegaly. 3. Aortic atherosclerosis. Electronically Signed   By: Almira Bar M.D.   On: 01/26/2023 22:12   DG Hip Unilat W or Wo Pelvis 2-3 Views Left  Result Date: 01/26/2023 CLINICAL DATA:  Fall with left hip pain EXAM: DG HIP (WITH OR WITHOUT PELVIS) 2-3V LEFT COMPARISON:  10/14/2015 FINDINGS: Right hip replacement. Pubic symphysis and rami appear intact. Acute left femoral neck fracture with superior displacement of the trochanter. No femoral head dislocation IMPRESSION: Acute displaced left femoral neck fracture. Electronically Signed   By: Jasmine Pang M.D.   On: 01/26/2023 17:43     Discharge Exam: Vitals:   01/31/23 0500 01/31/23 0839  BP: (!) 105/56   Pulse: 78 92  Resp: 18 (!) 21  Temp: 98.4 F (36.9 C)   SpO2: 96% 93%   Vitals:   01/30/23 1926 01/30/23 2055 01/31/23 0500 01/31/23 0839  BP:  (!) 117/58 (!) 105/56   Pulse:  77 78 92  Resp:  19 18 (!) 21  Temp:  98.5 F (36.9 C) 98.4 F (36.9 C)   TempSrc:  Oral Oral   SpO2: 97% 97% 96% 93%  Weight:      Height:        General: Pt is alert, awake, not in acute distress Cardiovascular: RRR, S1/S2 +, no rubs, no gallops Respiratory: CTA bilaterally, no wheezing, no rhonchi, nasal cannula Abdominal: Soft, NT, ND, bowel sounds + Extremities: no edema, no cyanosis    The results of significant diagnostics from this hospitalization (including imaging, microbiology, ancillary and laboratory) are listed below for reference.     Microbiology: Recent Results (from the past 240 hour(s))  Surgical pcr screen     Status: None   Collection Time: 01/26/23  9:50 PM   Specimen: Nasal Mucosa; Nasal Swab  Result Value Ref Range Status   MRSA, PCR NEGATIVE NEGATIVE Final   Staphylococcus aureus NEGATIVE NEGATIVE Final    Comment: (NOTE) The Xpert SA Assay (FDA approved for NASAL specimens in patients 46 years of age and older), is one component of a comprehensive surveillance program. It is not intended to diagnose infection nor to guide or monitor treatment. Performed at [facility], [address], [place], [place] [address]      Labs: BNP (last 3 results) Recent Labs    08/03/22 1543 08/27/22 1221 01/26/23 1823  BNP 208.0* 396.0* 253.0*   Basic Metabolic Panel: Recent Labs  Lab 01/27/23 0516 01/27/23 0550 01/28/23 0500 01/29/23 0448 01/30/23 0422 01/31/23 0439  NA 140  --  139 140 139 137  K 4.0  --  4.1 3.1* 4.0 4.0  CL 104  --  102 101 102 101  CO2 28  --  GLUCOSE 107*  --  147* 110* 107* 110*  BUN 24*  --  28* 34* 37* 44*  CREATININE 1.32*  --  1.56* 1.56* 1.45* 1.59*  CALCIUM 9.1  --  8.9 8.8* 8.9 9.0  MG  --  2.4 2.6* 2.4 2.5* 2.7*   Liver Function Tests: Recent Labs  Lab 01/26/23 1823  AST 16  ALT 16  ALKPHOS 95  BILITOT 0.6  PROT 7.3  ALBUMIN 3.8   No results for input(s): "LIPASE", "AMYLASE" in the last 168 hours. No results for input(s): "AMMONIA" in the last 168 hours. CBC: Recent Labs  Lab 01/26/23 1823 01/27/23 0516 01/28/23 0500 01/29/23 0448 01/30/23 0422 01/31/23 0439  WBC 13.2* 10.9* 17.3* 12.8* 11.3* 10.7*  NEUTROABS 10.0*  --   --   --   --   --   HGB 16.0 15.0 14.3 13.6 12.7* 12.8*  HCT 50.3 47.9 44.7 42.5 41.0 40.5  MCV 98.6 99.4 97.8 97.7 99.5 99.8  PLT 202 199 206 203 193 209   Cardiac Enzymes: No results for input(s): "CKTOTAL", "CKMB", "CKMBINDEX", "TROPONINI" in the last 168 hours. BNP: Invalid input(s): "POCBNP" CBG: No results for input(s): "GLUCAP" in the last 168 hours. D-Dimer No results for input(s): "DDIMER" in the last 72 hours. Hgb A1c No results for input(s): "HGBA1C" in the last 72 hours. Lipid Profile No results for input(s): "CHOL", "HDL", "LDLCALC", "TRIG", "CHOLHDL", "LDLDIRECT" in the last 72 hours. Thyroid function studies No results for input(s): "TSH", "T4TOTAL", "T3FREE", "THYROIDAB" in the last 72 hours.  Invalid input(s): "FREET3" Anemia work up No results for input(s): "VITAMINB12", "FOLATE", "FERRITIN", "TIBC", "IRON", "RETICCTPCT" in the last 72  hours. Urinalysis    Component Value Date/Time   COLORURINE YELLOW (A) 02/22/2017 1314   APPEARANCEUR CLEAR (A) 02/22/2017 1314   LABSPEC 1.010 02/22/2017 1314   PHURINE 5.0 02/22/2017 1314   GLUCOSEU NEGATIVE 02/22/2017 1314   HGBUR MODERATE (A) 02/22/2017 1314   BILIRUBINUR NEGATIVE 02/22/2017 1314   KETONESUR NEGATIVE 02/22/2017 1314   PROTEINUR NEGATIVE 02/22/2017 1314   NITRITE NEGATIVE 02/22/2017 1314   LEUKOCYTESUR NEGATIVE 02/22/2017 1314   Sepsis Labs Recent Labs  Lab 01/28/23 0500 01/29/23 0448 01/30/23 0422 01/31/23 0439  WBC 17.3* 12.8* 11.3* 10.7*   Microbiology Recent Results (from the past 240 hour(s))  Surgical pcr screen     Status: None   Collection Time: 01/26/23  9:50 PM   Specimen: Nasal Mucosa; Nasal Swab  Result Value Ref Range Status   MRSA, PCR NEGATIVE NEGATIVE Final   Staphylococcus aureus NEGATIVE NEGATIVE Final    Comment: (NOTE) The Xpert SA Assay (FDA approved for NASAL specimens in patients 55 years of age and older), is one component of a comprehensive surveillance program. It is not intended to diagnose infection nor to guide or monitor treatment. Performed at [facility], [address], [place], [place] [address]      Time coordinating discharge: 35 minutes  SIGNED:   Erick Blinks, DO [facility] 01/31/2023, 10:35 AM  If 7PM-7AM, please contact night-coverage [email or web address]

## 2023-01-31 NOTE — Care Management Important Message (Signed)
Important Message  Patient Details  Name: Gerald Hall MRN: [id number] Date of Birth: 09-04-1935   Medicare Important Message Given:  Yes     [name] 01/31/2023, 10:32 AM

## 2023-01-31 NOTE — Progress Notes (Signed)
Rounding Note    Patient Name: Gerald Hall Date of Encounter: 01/31/2023  [facility] Cardiologist: Nona Dell, MD   Subjective   No complaints  Inpatient Medications    Scheduled Meds:  amiodarone  100 mg Oral Daily   aspirin EC  81 mg Oral BID   budesonide (PULMICORT) nebulizer solution  0.25 mg Nebulization BID   carvedilol  3.125 mg Oral BID WC   dextromethorphan-guaiFENesin  1 tablet Oral Daily   empagliflozin  10 mg Oral Daily   finasteride  5 mg Oral Daily   fluticasone  2 spray Each Nare QHS   ipratropium-albuterol  3 mL Nebulization TID   methimazole  5 mg Oral Daily   mirabegron ER  50 mg Oral Daily   polyethylene glycol  17 g Oral Daily   pregabalin  25 mg Oral BID   rOPINIRole  2 mg Oral BID   sacubitril-valsartan  1 tablet Oral BID   tamsulosin  0.4 mg Oral BID   torsemide  10 mg Oral QODAY   Continuous Infusions:  PRN Meds: acetaminophen **OR** acetaminophen, clonazePAM, ipratropium-albuterol, morphine injection, traMADol   Vital Signs    Vitals:   01/30/23 1924 01/30/23 1926 01/30/23 2055 01/31/23 0500  BP:   (!) 117/58 (!) 105/56  Pulse:   77 78  Resp:   19 18  Temp:   98.5 F (36.9 C) 98.4 F (36.9 C)  TempSrc:   Oral Oral  SpO2: 97% 97% 97% 96%  Weight:      Height:        Intake/Output Summary (Last 24 hours) at 01/31/2023 0832 Last data filed at 01/31/2023 0500 Gross per 24 hour  Intake 480 ml  Output 950 ml  Net -470 ml      01/27/2023    1:20 PM 01/26/2023    9:34 PM 01/26/2023    5:09 PM  Last 3 Weights  Weight (lbs) 202 lb 6.1 oz 202 lb 6.1 oz 200 lb  Weight (kg) 91.8 kg 91.8 kg 90.719 kg      Telemetry    SR, short runs SVT - Personally Reviewed  ECG    N/a - Personally Reviewed  Physical Exam   GEN: No acute distress.   Neck: No JVD Cardiac: RRR, no murmurs, rubs, or gallops.  Respiratory: coarse expiratory sounds, mild crackles.  GI: Soft, nontender, non-distended  MS: No edema; No  deformity. Neuro:  Nonfocal  Psych: Normal affect   Labs    High Sensitivity Troponin:  No results for input(s): "TROPONINIHS" in the last 720 hours.   Chemistry Recent Labs  Lab 01/26/23 1823 01/27/23 0516 01/29/23 0448 01/30/23 0422 01/31/23 0439  NA 137   < > 140 139 137  K 3.9   < > 3.1* 4.0 4.0  CL 102   < > 101 102 101  CO2 26   < > 30 29 28   GLUCOSE 127*   < > 110* 107* 110*  BUN 29*   < > 34* 37* 44*  CREATININE 1.38*   < > 1.56* 1.45* 1.59*  CALCIUM 9.3   < > 8.8* 8.9 9.0  MG  --    < > 2.4 2.5* 2.7*  PROT 7.3  --   --   --   --   ALBUMIN 3.8  --   --   --   --   AST 16  --   --   --   --   ALT  16  --   --   --   --   ALKPHOS 95  --   --   --   --   BILITOT 0.6  --   --   --   --   GFRNONAA 50*   < > 43* 47* 42*  ANIONGAP 9   < > 9 8 8    < > = values in this interval not displayed.    Lipids No results for input(s): "CHOL", "TRIG", "HDL", "LABVLDL", "LDLCALC", "CHOLHDL" in the last 168 hours.  Hematology Recent Labs  Lab 01/29/23 0448 01/30/23 0422 01/31/23 0439  WBC 12.8* 11.3* 10.7*  RBC 4.35 4.12* 4.06*  HGB 13.6 12.7* 12.8*  HCT 42.5 41.0 40.5  MCV 97.7 99.5 99.8  MCH 31.3 30.8 31.5  MCHC 32.0 31.0 31.6  RDW 15.1 14.9 14.7  PLT 203 193 209   Thyroid No results for input(s): "TSH", "FREET4" in the last 168 hours.  BNP Recent Labs  Lab 01/26/23 1823  BNP 253.0*    DDimer No results for input(s): "DDIMER" in the last 168 hours.   Radiology    No results found.  Cardiac Studies     Patient Profile     Patient is 88 y/o M known to have cardiomyopathy LVEF 35% on medical management, SVT on amiodarone c/w prolonged QTc, HTN, COPD is currently admitted to hospitalist team for the management of acute displaced femoral neck fracture, s/p L hemiarthroplasty on 01/27/2023.   Assessment & Plan    1.Acute on chronic HFrEF - 02/2019 echo LVEF 45-50% - 12/2021 echo LVEF 40-45%, grade I dd - 11/2022 echo LVEF: 30-35%  - negative yesterday,  negative 3.8 L since admission. Received oral torsemide 10mg  yesterday.Mild variations in Cr without clear trend. He is on torsemide 10mg , had been on 10mg  daily at home, will try changing to daily here.  - reports he is at his baseline from breathing standpoint  -medical therapy limited by soft bp's at times, renal dysfunction - medical therapy with coreg 3.125mg  bid, jardiance 10mg , entresto 24/26mg  bid. MRA has not been started given soft bp's, renal function  2. Long QTc - hold prolonging meds  3. COPD on home O2  4. S/p left hip hemiarthroplasty 01/27/23 - presented after mechanical fall  5. Afib - EKG with SR, QTc 480. Reasonable to continue low dose amio with this QTc on amio given lower risk of QT induced ventricular arrhythmias completed with other class III antiarrhythmics -nonsustaine druns of SVT now in SR, continue current meds  For questions or updates, please contact [facility] Please consult [email or web address] for contact info under        Signed, Dina Rich, MD  01/31/2023, 8:32 AM

## 2023-02-01 ENCOUNTER — Inpatient Hospital Stay (HOSPITAL_COMMUNITY): Payer: Medicare Other

## 2023-02-01 DIAGNOSIS — S72002A Fracture of unspecified part of neck of left femur, initial encounter for closed fracture: Secondary | ICD-10-CM | POA: Diagnosis not present

## 2023-02-01 LAB — CBC
HCT: 43.1 % (ref 39.0–52.0)
Hemoglobin: 13.5 g/dL (ref 13.0–17.0)
MCH: 31.4 pg (ref 26.0–34.0)
MCHC: 31.3 g/dL (ref 30.0–36.0)
MCV: 100.2 fL — ABNORMAL HIGH (ref 80.0–100.0)
Platelets: 240 10*3/uL (ref 150–400)
RBC: 4.3 MIL/uL (ref 4.22–5.81)
RDW: 14.6 % (ref 11.5–15.5)
WBC: 17.4 10*3/uL — ABNORMAL HIGH (ref 4.0–10.5)
nRBC: 0 % (ref 0.0–0.2)

## 2023-02-01 LAB — BASIC METABOLIC PANEL
Anion gap: 8 (ref 5–15)
BUN: 53 mg/dL — ABNORMAL HIGH (ref 8–23)
CO2: 28 mmol/L (ref 22–32)
Calcium: 9.4 mg/dL (ref 8.9–10.3)
Chloride: 101 mmol/L (ref 98–111)
Creatinine, Ser: 1.79 mg/dL — ABNORMAL HIGH (ref 0.61–1.24)
GFR, Estimated: 37 mL/min — ABNORMAL LOW (ref >60)
Glucose, Bld: 174 mg/dL — ABNORMAL HIGH (ref 70–99)
Potassium: 4.5 mmol/L (ref 3.5–5.1)
Sodium: 137 mmol/L (ref 135–145)

## 2023-02-01 LAB — MAGNESIUM: Magnesium: 3 mg/dL — ABNORMAL HIGH (ref 1.7–2.4)

## 2023-02-01 MED ORDER — SODIUM CHLORIDE 0.9 % IV BOLUS
250.0000 mL | Freq: Once | INTRAVENOUS | Status: AC
  Administered 2023-02-01: 250 mL via INTRAVENOUS

## 2023-02-01 MED ORDER — LACTATED RINGERS IV BOLUS
250.0000 mL | Freq: Once | INTRAVENOUS | Status: AC
  Administered 2023-02-01: 250 mL via INTRAVENOUS

## 2023-02-01 MED ORDER — LACTATED RINGERS IV SOLN: INTRAVENOUS | Status: DC

## 2023-02-01 NOTE — TOC Progression Note (Signed)
Transition of Care [facility]) - Progression Note    Patient Details  Name: Gerald Hall MRN: [id number] Date of Birth: 02-20-1935  Transition of Care [facility]) CM/SW Contact  Karn Cassis, [place] Phone Number: 02/01/2023, 10:05 AM  Clinical Narrative: Pt's d/c held yesterday due to vomiting. Possible d/c tomorrow. Tiffany at [facility] [employer] updated.    Expected Discharge Plan: Skilled Nursing Facility Barriers to Discharge: Barriers Resolved  Expected Discharge Plan and Services In-house Referral: Clinical Social Work     Living arrangements for the past 2 months: Single Family Home Expected Discharge Date: 01/31/23                                     Social Determinants of Health (SDOH) Interventions SDOH Screenings   Food Insecurity: No Food Insecurity (01/26/2023)  Housing: Low Risk  (01/26/2023)  Transportation Needs: No Transportation Needs (01/26/2023)  Utilities: Not At Risk (01/26/2023)  Alcohol Screen: Low Risk  (03/16/2022)  Depression (PHQ2-9): Low Risk  (01/12/2023)  Financial Resource Strain: Low Risk  (03/16/2022)  Physical Activity: Sufficiently Active (03/16/2022)  Social Connections: Moderately Integrated (03/16/2022)  Stress: No Stress Concern Present (03/16/2022)  Tobacco Use: High Risk (01/27/2023)    Readmission Risk Interventions     No data to display

## 2023-02-01 NOTE — Progress Notes (Signed)
PROGRESS NOTE    Gerald Hall  MPN:[id number] DOB: Aug 02, 1935 DOA: 01/26/2023 PCP: Babs Sciara, MD   Brief Narrative:    Gerald Hall is a 88 y.o. male with medical history significant for BPH, atria fib, COPD with chronic respiratory failure, CKD3.  Patient was brought to the ED with reports of a fall and subsequent left hip pain.  At baseline patient ambulates with a walker.  Patient was admitted with closed displaced fracture of left femoral neck and orthopedics to see.  Cardiology consult for clearance completed and he underwent left hip hemiarthroplasty 4/4.  He was being set up for discharge on 4/8 to SNF and then suddenly began having some dry heaving as well as some diarrhea after he received an enema to help with bowel movement.  Plan will be to further evaluate for GI pathogens.  Holding diuretics for now.  Family members are quite concerned and he does not appear to be feeling well today.  Assessment & Plan:   Principal Problem:   Closed displaced fracture of left femoral neck Active Problems:   Secondary cardiomyopathy   Chronic atrial fibrillation, unspecified   COPD (chronic obstructive pulmonary disease)   HTN (hypertension), benign   Hyperthyroidism   CKD stage 3a, GFR 45-59 ml/min   Chronic respiratory failure with hypoxia   Acute on chronic systolic heart failure   Prolonged Q-T interval on ECG  Assessment and Plan:   Closed displaced fracture of left femoral neck status post left hip hemiarthroplasty 4/4 Status post mechanical fall.  Pelvic x-ray shows acute displaced left femoral neck fracture. -IV morphine 2 mg every 4 hourly as needed -PT/OT evaluation recommending SNF and patient may discharge once more clinically stable  Intractable nausea and vomiting and diarrhea -Noted after enema yesterday and may just be side effects related to this -Continue to monitor today off of torsemide and ensure stability prior to discharge in a.m. to SNF   Secondary  cardiomyopathy Chronic bilateral 1+ pitting edema, unchanged.  Last echo shows EF of 30 to 35%, with grade 1 DD. -Continue Entresto, Coreg, Jardiance -Holding torsemide for now until dietary intake improves as well as diarrhea -BNP 253 -Chest x-ray with no acute findings   COPD (chronic obstructive pulmonary disease) Diffuse wheezing on exam, no cough, denies dyspnea, reports he missed 2 doses of his bronchodilators.  On 2 L chronically. -Resume DuoNebs, and home regimen   Chronic atrial fibrillation, unspecified Sinus rhythm, rate Controlled on amiodarone and carvedilol.  Not on anticoagulation.  Follows with Dr. Cindra Eves.  QTc prolonged at 536, also with first-degree heart block with PR interval of 345-  Not new. -Resume amiodarone, carvedilol   CKD stage 3a, GFR 45-59 ml/min Creatinine stable- 1.38. -Continue Entresto   Hyperthyroidism Resume methimazole   HTN (hypertension), benign Systolic 140s to 008Q. -Resume carvedilol, tamsulosin   Chronic respiratory failure with hypoxia On 2 L.  Obesity BMI 30.77    DVT prophylaxis:SCDs Code Status: DNR Family Communication: Daughters at bedside 4/9 Disposition Plan: Admit for ORIF Status is: Inpatient Remains inpatient appropriate because: Need for IV medications  Consultants:  Orthopedics Cardiology  Procedures:  None  Antimicrobials:  Anti-infectives (From admission, onward)    Start     Dose/Rate Route Frequency Ordered Stop   01/27/23 2300  ceFAZolin (ANCEF) IVPB 2g/100 mL premix        2 g 200 mL/hr over 30 Minutes Intravenous Every 8 hours 01/27/23 1819 01/28/23 1459   01/27/23 1614  vancomycin (  VANCOCIN) powder  Status:  Discontinued          As needed 01/27/23 1614 01/27/23 1819   01/27/23 1200  ceFAZolin (ANCEF) IVPB 2g/100 mL premix        2 g 200 mL/hr over 30 Minutes Intravenous On call to O.R. 01/26/23 1951 01/27/23 1444       Subjective: Patient seen and evaluated today and feels quite unwell.   He has had a busy night with dry heaving and multiple bowel movements.  He did not have supper last night and is not sure if he will have breakfast.  Objective: Vitals:   01/31/23 2113 02/01/23 0404 02/01/23 0724 02/01/23 0825  BP: (!) 114/58 (!) 94/56  114/66  Pulse:  91 91 88  Resp:  20 20 18   Temp: 98.7 F (37.1 C) 98.5 F (36.9 C)  98.6 F (37 C)  TempSrc: Oral Oral  Oral  SpO2:  94% 92% 94%  Weight:      Height:        Intake/Output Summary (Last 24 hours) at 02/01/2023 1010 Last data filed at 02/01/2023 0420 Gross per 24 hour  Intake --  Output 600 ml  Net -600 ml   Filed Weights   01/26/23 1709 01/26/23 2134 01/27/23 1320  Weight: 90.7 kg 91.8 kg 91.8 kg    Examination:  General exam: Appears calm and comfortable  Respiratory system: Clear to auscultation. Respiratory effort normal.  2 L nasal cannula Cardiovascular system: S1 & S2 heard, RRR.  Gastrointestinal system: Abdomen is soft Central nervous system: Alert and awake Extremities: No edema Skin: No significant lesions noted Psychiatry: Flat affect.    Data Reviewed: I have personally reviewed following labs and imaging studies  CBC: Recent Labs  Lab 01/26/23 1823 01/27/23 0516 01/28/23 0500 01/29/23 0448 01/30/23 0422 01/31/23 0439 02/01/23 0913  WBC 13.2*   < > 17.3* 12.8* 11.3* 10.7* 17.4*  NEUTROABS 10.0*  --   --   --   --   --   --   HGB 16.0   < > 14.3 13.6 12.7* 12.8* 13.5  HCT 50.3   < > 44.7 42.5 41.0 40.5 43.1  MCV 98.6   < > 97.8 97.7 99.5 99.8 100.2*  PLT 202   < > 206 203 193 209 240   < > = values in this interval not displayed.   Basic Metabolic Panel: Recent Labs  Lab 01/28/23 0500 01/29/23 0448 01/30/23 0422 01/31/23 0439 02/01/23 0913  NA 139 140 139 137 137  K 4.1 3.1* 4.0 4.0 4.5  CL 102 101 102 101 101  CO2 27 30 29 28 28   GLUCOSE 147* 110* 107* 110* 174*  BUN 28* 34* 37* 44* 53*  CREATININE 1.56* 1.56* 1.45* 1.59* 1.79*  CALCIUM 8.9 8.8* 8.9 9.0 9.4  MG  2.6* 2.4 2.5* 2.7* 3.0*   GFR: Estimated Creatinine Clearance: 33.2 mL/min (A) (by C-G formula based on SCr of 1.79 mg/dL (H)). Liver Function Tests: Recent Labs  Lab 01/26/23 1823  AST 16  ALT 16  ALKPHOS 95  BILITOT 0.6  PROT 7.3  ALBUMIN 3.8   No results for input(s): "LIPASE", "AMYLASE" in the last 168 hours. No results for input(s): "AMMONIA" in the last 168 hours. Coagulation Profile: No results for input(s): "INR", "PROTIME" in the last 168 hours. Cardiac Enzymes: No results for input(s): "CKTOTAL", "CKMB", "CKMBINDEX", "TROPONINI" in the last 168 hours. BNP (last 3 results) No results for input(s): "PROBNP" in the last  8760 hours. HbA1C: No results for input(s): "HGBA1C" in the last 72 hours. CBG: No results for input(s): "GLUCAP" in the last 168 hours. Lipid Profile: No results for input(s): "CHOL", "HDL", "LDLCALC", "TRIG", "CHOLHDL", "LDLDIRECT" in the last 72 hours. Thyroid Function Tests: No results for input(s): "TSH", "T4TOTAL", "FREET4", "T3FREE", "THYROIDAB" in the last 72 hours. Anemia Panel: No results for input(s): "VITAMINB12", "FOLATE", "FERRITIN", "TIBC", "IRON", "RETICCTPCT" in the last 72 hours. Sepsis Labs: No results for input(s): "PROCALCITON", "LATICACIDVEN" in the last 168 hours.  Recent Results (from the past 240 hour(s))  Surgical pcr screen     Status: None   Collection Time: 01/26/23  9:50 PM   Specimen: Nasal Mucosa; Nasal Swab  Result Value Ref Range Status   MRSA, PCR NEGATIVE NEGATIVE Final   Staphylococcus aureus NEGATIVE NEGATIVE Final    Comment: (NOTE) The Xpert SA Assay (FDA approved for NASAL specimens in patients 86 years of age and older), is one component of a comprehensive surveillance program. It is not intended to diagnose infection nor to guide or monitor treatment. Performed at [facility], [address] [place], [place] [address]          Radiology Studies: No results found.      Scheduled Meds:   amiodarone  100 mg Oral Daily   aspirin EC  81 mg Oral BID   budesonide (PULMICORT) nebulizer solution  0.25 mg Nebulization BID   carvedilol  3.125 mg Oral BID WC   dextromethorphan-guaiFENesin  1 tablet Oral Daily   empagliflozin  10 mg Oral Daily   finasteride  5 mg Oral Daily   fluticasone  2 spray Each Nare QHS   ipratropium-albuterol  3 mL Nebulization TID   methimazole  5 mg Oral Daily   mirabegron ER  50 mg Oral Daily   polyethylene glycol  17 g Oral Daily   rOPINIRole  2 mg Oral BID   sacubitril-valsartan  1 tablet Oral BID   tamsulosin  0.4 mg Oral BID        LOS: 6 days    Time spent: 35 minutes    Laneisha Mino Hoover Brunette Shonte Beutler, DO [facility]  If 7PM-7AM, please contact night-coverage [email or web address] 02/01/2023, 10:10 AM

## 2023-02-01 NOTE — Progress Notes (Addendum)
Rounding Note    Patient Name: Gerald Hall Date of Encounter: 02/01/2023  [facility] Cardiologist: Nona Dell, MD   Subjective   Was planning for discharge to SNF yesterday but delayed due to N/V. Feels better this morning. Eating breakfast. Feels that his breathing is back to baseline. No chest pain or palpitations.   Inpatient Medications    Scheduled Meds:  amiodarone  100 mg Oral Daily   aspirin EC  81 mg Oral BID   budesonide (PULMICORT) nebulizer solution  0.25 mg Nebulization BID   carvedilol  3.125 mg Oral BID WC   dextromethorphan-guaiFENesin  1 tablet Oral Daily   empagliflozin  10 mg Oral Daily   finasteride  5 mg Oral Daily   fluticasone  2 spray Each Nare QHS   ipratropium-albuterol  3 mL Nebulization TID   methimazole  5 mg Oral Daily   mirabegron ER  50 mg Oral Daily   polyethylene glycol  17 g Oral Daily   rOPINIRole  2 mg Oral BID   sacubitril-valsartan  1 tablet Oral BID   tamsulosin  0.4 mg Oral BID   Continuous Infusions:  PRN Meds: acetaminophen **OR** acetaminophen, ipratropium-albuterol, morphine injection, ondansetron (ZOFRAN) IV, traMADol   Vital Signs    Vitals:   01/31/23 2113 02/01/23 0404 02/01/23 0724 02/01/23 0825  BP: (!) 114/58 (!) 94/56  114/66  Pulse:  91 91 88  Resp:  20 20 18   Temp: 98.7 F (37.1 C) 98.5 F (36.9 C)  98.6 F (37 C)  TempSrc: Oral Oral  Oral  SpO2:  94% 92% 94%  Weight:      Height:        Intake/Output Summary (Last 24 hours) at 02/01/2023 0901 Last data filed at 02/01/2023 0420 Gross per 24 hour  Intake --  Output 600 ml  Net -600 ml      01/27/2023    1:20 PM 01/26/2023    9:34 PM 01/26/2023    5:09 PM  Last 3 Weights  Weight (lbs) 202 lb 6.1 oz 202 lb 6.1 oz 200 lb  Weight (kg) 91.8 kg 91.8 kg 90.719 kg      Telemetry    Not currently connected to telemetry.   ECG    NSR, HR 90 with 1st degree AV Block. Computer-generated QTc at 638 ms but corrected around 539 ms as  computer-generated read appears to be counting his p-wave as the t-wave at times given his 1st degree block.  - Personally Reviewed  Physical Exam   GEN: Pleasant, elderly male appearing in no acute distress.   Neck: No JVD Cardiac: RRR, no murmurs, rubs, or gallops.  Respiratory: Mild rales. No wheezing.  GI: Soft, nontender, non-distended  MS: No pitting edema; No deformity. Neuro:  Nonfocal  Psych: Normal affect   Labs    High Sensitivity Troponin:  No results for input(s): "TROPONINIHS" in the last 720 hours.   Chemistry Recent Labs  Lab 01/26/23 1823 01/27/23 0516 01/29/23 0448 01/30/23 0422 01/31/23 0439  NA 137   < > 140 139 137  K 3.9   < > 3.1* 4.0 4.0  CL 102   < > 101 102 101  CO2 26   < > 30 29 28   GLUCOSE 127*   < > 110* 107* 110*  BUN 29*   < > 34* 37* 44*  CREATININE 1.38*   < > 1.56* 1.45* 1.59*  CALCIUM 9.3   < > 8.8* 8.9 9.0  MG  --    < > 2.4 2.5* 2.7*  PROT 7.3  --   --   --   --   ALBUMIN 3.8  --   --   --   --   AST 16  --   --   --   --   ALT 16  --   --   --   --   ALKPHOS 95  --   --   --   --   BILITOT 0.6  --   --   --   --   GFRNONAA 50*   < > 43* 47* 42*  ANIONGAP 9   < > 9 8 8    < > = values in this interval not displayed.    Lipids No results for input(s): "CHOL", "TRIG", "HDL", "LABVLDL", "LDLCALC", "CHOLHDL" in the last 168 hours.  Hematology Recent Labs  Lab 01/29/23 0448 01/30/23 0422 01/31/23 0439  WBC 12.8* 11.3* 10.7*  RBC 4.35 4.12* 4.06*  HGB 13.6 12.7* 12.8*  HCT 42.5 41.0 40.5  MCV 97.7 99.5 99.8  MCH 31.3 30.8 31.5  MCHC 32.0 31.0 31.6  RDW 15.1 14.9 14.7  PLT 203 193 209   Thyroid No results for input(s): "TSH", "FREET4" in the last 168 hours.  BNP Recent Labs  Lab 01/26/23 1823  BNP 253.0*    DDimer No results for input(s): "DDIMER" in the last 168 hours.   Radiology    No results found.  Cardiac Studies   Echocardiogram: 11/2022 IMPRESSIONS     1. Left ventricular ejection fraction, by  estimation, is 30 to 35%. The  left ventricle has moderately decreased function. The left ventricle has  no regional wall motion abnormalities. There is mild left ventricular  hypertrophy. Left ventricular  diastolic parameters are consistent with Grade I diastolic dysfunction  (impaired relaxation).   2. Right ventricular systolic function is normal. The right ventricular  size is mildly enlarged. Tricuspid regurgitation signal is inadequate for  assessing PA pressure.   3. Left atrial size was moderately dilated.   4. Right atrial size was moderately dilated.   5. The mitral valve is normal in structure. Mild mitral valve  regurgitation. No evidence of mitral stenosis.   6. The aortic valve is tricuspid. Aortic valve regurgitation is not  visualized. No aortic stenosis is present.   7. The inferior vena cava is normal in size with greater than 50%  respiratory variability, suggesting right atrial pressure of 3 mmHg.   Patient Profile     88 y.o. male with a hx of HFrEF (EF 45-50% in 02/2019, at 40-45% in 12/2021 and 30-35% in 11/2022), pSVT, COPD (on 2L [place] at baseline), HTN, HLD, hyperthyroidism and temporal arteritis who is currently admitted after suffering a mechanical fall and having an acute displaced femoral neck fracture. Underwent repair on 01/27/2023.  Assessment & Plan    1. Preoperative Cardiac Clearance for Hemiarthroplasty - He underwent hemiarthroplasty on 01/27/2023 with no immediate complications noted. Awaiting discharge to SNF for rehab.   2. Acute HFrEF  - He has a known cardiomyopathy with EF of 30-35% by most recent imaging in 11/2022. Appears he has not undergone ischemic evaluation due to his age and no reported anginal symptoms. Received IV Lasix in the perioperative setting and has now been transitioned to Torsemide 10mg  daily. Recorded net output of -4.4 L thus far and weights have not been recorded. He has been continued on Coreg 3.125 mg twice daily,  Entresto  24-26 mg twice daily and Jardiance 10mg  daily. He has not been on an MRA given variable renal function and cannot add at this time due to intermittent hypotension. Labs pending for today but creatinine was at 1.59 yesterday which is close to baseline. Will need a repeat BMET as an outpatient (already has follow-up scheduled for 02/14/2023).   3. SVT/Prolonged QTc - He was on Amiodarone 200mg  daily prior to admission due to prior SVT and concerns that this was contributing to his cardiomyopathy. Dosing reduced to 100mg  daily this admission due to prolonged QTc. Repeat EKG this AM shows NSR, HR 90 with 1st degree AV Block and the computer-generated QTc at 638 ms but corrected around 539 ms as computer-generated read appears to be counting his p-wave as the t-wave at times given his 1st degree block. Noted on tracings last week as well. Had received Zofran then and received Zofran yesterday. Will again discontinue.    4. HTN - BP has been variable from 88/55 - 114/58 in the past 24 hours, at 144/66 on most recent check. If BP remains soft, could switch Coreg to Toprol-XL. Already on the lowest dose of Entresto.    5. COPD - On 2L  at baseline.   For questions or updates, please contact [facility] Please consult [email or web address] for contact info under        Signed, Ellsworth LennoxBrittany M Strader, PA-C  02/01/2023, 9:01 AM    Attending note Patient seen and discussed with PA Iran OuchStrader, I agree with her documentation   1.Acute on chronic HFrEF - 02/2019 echo LVEF 45-50% - 12/2021 echo LVEF 40-45%, grade I dd - 11/2022 echo LVEF: 30-35%   - I/Os incomplete yesterday, negative rougly 4.4 L since admission.  He is on oral torsemide 10mg  daily his home regimen. AKI, diuretic on hold.  - reports he is at his baseline from breathing standpoint   -medical therapy limited by soft bp's at times, renal dysfunction - medical therapy with coreg 3.125mg  bid, jardiance 10mg , entresto 24/26mg  bid. MRA has not  been started given soft bp's, renal function - hold diuretic today, would start oral back tomorrow torsemide 10mg  daily. Additional volume losses with N/V may have played a role in AKI   2. Long QTc - hold prolonging meds - difficult to calculate manually given first degree block with P wave on end of [place]. Accounting for RBBB my calculated QTc is 480ms, avoid additional prolonging agents.    3. COPD on home O2   4. S/p left hip hemiarthroplasty 01/27/23 - presented after mechanical fall   5. Afib - EKG with SR, QTc 480. Reasonable to continue low dose amio with this QTc on amio given lower risk of QT induced ventricular arrhythmias compared with other class III antiarrhythmics -nonsustained runs of SVT now in SR, continue current meds  6. N/V - per primary team   Dina RichJonathan Esmee Fallaw MD

## 2023-02-01 NOTE — Progress Notes (Signed)
Patient received a 250 cc Bolus of LR this afternoon due his blood pressure being 85/51, no c/o pain or discomfort noted , patient stated "I feel tired". After LR Bolus Blood pressure was 92/52. Dr Sherryll Burger Notified. Patient resting quietly ,family at bedside.Plan of care on going.

## 2023-02-01 NOTE — Progress Notes (Signed)
   02/01/23 1227  Vitals  BP (!) 85/51  MAP (mmHg) (!) 63  BP Location Left Arm  BP Method Automatic  Patient Position (if appropriate) Lying  Pulse Rate 81  Pulse Rate Source Dinamap  MEWS COLOR  MEWS Score Color Green  Oxygen Therapy  SpO2 95 %  O2 Device Nasal Cannula  O2 Flow Rate (L/min) 2 L/min  MEWS Score  MEWS Temp 0  MEWS Systolic 1  MEWS Pulse 0  MEWS RR 0  MEWS LOC 0  MEWS Score 1  Provider Notification  Provider Name/Title Dr Sherryll Burger  Date Provider Notified 02/01/23  Time Provider Notified 1230  Method of Notification Page  Notification Reason Other (Comment) (B/P 85/51)  Provider response See new orders  Date of Provider Response 02/01/23  Time of Provider Response 1232

## 2023-02-02 ENCOUNTER — Inpatient Hospital Stay (HOSPITAL_COMMUNITY): Payer: Medicare Other

## 2023-02-02 DIAGNOSIS — S72002A Fracture of unspecified part of neck of left femur, initial encounter for closed fracture: Secondary | ICD-10-CM | POA: Diagnosis not present

## 2023-02-02 LAB — CBC
HCT: 39.4 % (ref 39.0–52.0)
Hemoglobin: 12.1 g/dL — ABNORMAL LOW (ref 13.0–17.0)
MCH: 31 pg (ref 26.0–34.0)
MCHC: 30.7 g/dL (ref 30.0–36.0)
MCV: 101 fL — ABNORMAL HIGH (ref 80.0–100.0)
Platelets: 236 10*3/uL (ref 150–400)
RBC: 3.9 MIL/uL — ABNORMAL LOW (ref 4.22–5.81)
RDW: 14.7 % (ref 11.5–15.5)
WBC: 19.5 10*3/uL — ABNORMAL HIGH (ref 4.0–10.5)
nRBC: 0 % (ref 0.0–0.2)

## 2023-02-02 LAB — BASIC METABOLIC PANEL
Anion gap: 7 (ref 5–15)
BUN: 64 mg/dL — ABNORMAL HIGH (ref 8–23)
CO2: 28 mmol/L (ref 22–32)
Calcium: 9.2 mg/dL (ref 8.9–10.3)
Chloride: 103 mmol/L (ref 98–111)
Creatinine, Ser: 1.96 mg/dL — ABNORMAL HIGH (ref 0.61–1.24)
GFR, Estimated: 33 mL/min — ABNORMAL LOW (ref >60)
Glucose, Bld: 144 mg/dL — ABNORMAL HIGH (ref 70–99)
Potassium: 4 mmol/L (ref 3.5–5.1)
Sodium: 138 mmol/L (ref 135–145)

## 2023-02-02 LAB — MAGNESIUM: Magnesium: 3.1 mg/dL — ABNORMAL HIGH (ref 1.7–2.4)

## 2023-02-02 MED ORDER — SODIUM CHLORIDE 0.9 % IV SOLN: INTRAVENOUS | Status: AC

## 2023-02-02 MED ORDER — EMPAGLIFLOZIN 10 MG PO TABS: 10.0000 mg | ORAL_TABLET | Freq: Every day | ORAL | Status: DC

## 2023-02-02 NOTE — Progress Notes (Signed)
Rounding Note    Patient Name: Gerald Hall Date of Encounter: 02/02/2023  [facility] Cardiologist: Nona Dell, MD   Subjective   Breathing back to baseline  Inpatient Medications    Scheduled Meds:  amiodarone  100 mg Oral Daily   aspirin EC  81 mg Oral BID   budesonide (PULMICORT) nebulizer solution  0.25 mg Nebulization BID   carvedilol  3.125 mg Oral BID WC   dextromethorphan-guaiFENesin  1 tablet Oral Daily   finasteride  5 mg Oral Daily   fluticasone  2 spray Each Nare QHS   methimazole  5 mg Oral Daily   mirabegron ER  50 mg Oral Daily   polyethylene glycol  17 g Oral Daily   rOPINIRole  2 mg Oral BID   tamsulosin  0.4 mg Oral BID   Continuous Infusions:  PRN Meds: acetaminophen **OR** acetaminophen, ipratropium-albuterol, morphine injection, traMADol   Vital Signs    Vitals:   02/01/23 2108 02/01/23 2312 02/02/23 0429 02/02/23 0823  BP: (!) 87/52 (!) 107/55 (!) 91/53 (!) 113/59  Pulse: 79  78 77  Resp: 18  18 18   Temp: 98.7 F (37.1 C)  98.6 F (37 C) 98.2 F (36.8 C)  TempSrc: Oral  Oral Oral  SpO2: 91%  96%   Weight:      Height:        Intake/Output Summary (Last 24 hours) at 02/02/2023 1030 Last data filed at 02/02/2023 0500 Gross per 24 hour  Intake 372.61 ml  Output 1450 ml  Net -1077.39 ml      01/27/2023    1:20 PM 01/26/2023    9:34 PM 01/26/2023    5:09 PM  Last 3 Weights  Weight (lbs) 202 lb 6.1 oz 202 lb 6.1 oz 200 lb  Weight (kg) 91.8 kg 91.8 kg 90.719 kg      Telemetry    N/a - Personally Reviewed  ECG     - Personally Reviewed  Physical Exam   GEN: No acute distress.   Neck: No JVD Cardiac: RRR, no murmurs, rubs, or gallops.  Respiratory: Coarse bialterally GI: Soft, nontender, non-distended  MS: No edema; No deformity. Neuro:  Nonfocal  Psych: Normal affect   Labs    High Sensitivity Troponin:  No results for input(s): "TROPONINIHS" in the last 720 hours.   Chemistry Recent Labs  Lab  01/26/23 1823 01/27/23 0516 01/31/23 0439 02/01/23 0913 02/02/23 0439  NA 137   < > 137 137 138  K 3.9   < > 4.0 4.5 4.0  CL 102   < > 101 101 103  CO2 26   < > 28 28 28   GLUCOSE 127*   < > 110* 174* 144*  BUN 29*   < > 44* 53* 64*  CREATININE 1.38*   < > 1.59* 1.79* 1.96*  CALCIUM 9.3   < > 9.0 9.4 9.2  MG  --    < > 2.7* 3.0* 3.1*  PROT 7.3  --   --   --   --   ALBUMIN 3.8  --   --   --   --   AST 16  --   --   --   --   ALT 16  --   --   --   --   ALKPHOS 95  --   --   --   --   BILITOT 0.6  --   --   --   --  GFRNONAA 50*   < > 42* 37* 33*  ANIONGAP 9   < > 8 8 7    < > = values in this interval not displayed.    Lipids No results for input(s): "CHOL", "TRIG", "HDL", "LABVLDL", "LDLCALC", "CHOLHDL" in the last 168 hours.  Hematology Recent Labs  Lab 01/31/23 0439 02/01/23 0913 02/02/23 0439  WBC 10.7* 17.4* 19.5*  RBC 4.06* 4.30 3.90*  HGB 12.8* 13.5 12.1*  HCT 40.5 43.1 39.4  MCV 99.8 100.2* 101.0*  MCH 31.5 31.4 31.0  MCHC 31.6 31.3 30.7  RDW 14.7 14.6 14.7  PLT 209 240 236   Thyroid No results for input(s): "TSH", "FREET4" in the last 168 hours.  BNP Recent Labs  Lab 01/26/23 1823  BNP 253.0*    DDimer No results for input(s): "DDIMER" in the last 168 hours.   Radiology    DG CHEST PORT 1 VIEW  Result Date: 02/01/2023 CLINICAL DATA:  Aspiration into airway EXAM: PORTABLE CHEST 1 VIEW COMPARISON:  01/26/2023 FINDINGS: Transverse diameter of heart is increased. There is poor inspiration. Lung fields are clear of any infiltrates or pulmonary edema. Small linear densities are seen in left lower lung field. There is no pleural effusion or pneumothorax. Degenerative changes are noted in right shoulder. IMPRESSION: Cardiomegaly. Small linear densities in left lower lung field suggest subsegmental atelectasis. There are no signs of pulmonary edema or focal pulmonary consolidation. Electronically Signed   By: Ernie Avena M.D.   On: 02/01/2023 12:10     Cardiac Studies    Patient Profile     88 y.o. male with a hx of HFrEF (EF 45-50% in 02/2019, at 40-45% in 12/2021 and 30-35% in 11/2022), pSVT, COPD (on 2L [place] at baseline), HTN, HLD, hyperthyroidism and temporal arteritis who is currently admitted after suffering a mechanical fall and having an acute displaced femoral neck fracture. Underwent repair on 01/27/2023.   Assessment & Plan    1.Acute on chronic HFrEF - 02/2019 echo LVEF 45-50% - 12/2021 echo LVEF 40-45%, grade I dd - 11/2022 echo LVEF: 30-35%     - reports he is at his baseline from breathing standpoint. CXR yesterday no pulm edema.  -medical therapy limited by soft bp's at times, renal dysfunction  - medical therapy with coreg 3.125mg  bid, jardiance 10mg . Continue to hold entresto with AKI and soft bp's, have not started MRA as of yet. Had been diuresed then significant GI fluid losses with vomiting and diarrhea - gentle IVFs today NS 17ml/hr x 7 hours.    2. Long QTc - hold prolonging meds - difficult to calculate manually given first degree block with P wave on end of [place]. Accounting for RBBB my calculated QTc is , avoid additional prolonging agents.    3. COPD on home O2   4. S/p left hip hemiarthroplasty 01/27/23 - presented after mechanical fall   5. Afib - EKG with SR, QTc 480. Reasonable to continue low dose amio with this QTc on amio given lower risk of QT induced ventricular arrhythmias compared with other class III antiarrhythmics -nonsustained runs of SVT now in SR, continue current meds   6. N/V - per primary team  For questions or updates, please contact [facility] Please consult [email or web address] for contact info under        Signed, Dina Rich, MD  02/02/2023, 10:30 AM

## 2023-02-02 NOTE — Progress Notes (Signed)
Physical Therapy Treatment Patient Details Name: Gerald Hall MRN: [id number] DOB: August 05, 1935 Today's Date: 02/02/2023   History of Present Illness Gerald Hall is a 88 y.o. male with medical history significant for BPH, atria fib, COPD with chronic respiratory failure, CKD3.  Patient was brought to the ED with reports of a fall and subsequent left hip pain.  At baseline patient ambulates with a walker.  Today he bent over to pick something from the floor (Tissue) and fell over onto his left hip.  He otherwise has no chest pain or difficulty breathing, no dizziness.  No memory problems.  He has a caregiver that lives with him.  No cough.  Swelling to his bilateral lower extremities that is at baseline today.  He is on chronic O2.    PT Comments    Patient demonstrates slow labored movement for sitting up at bedside with difficulty moving LLE due to hip pain/discomfort, continues to have right lateral leaning once seated at bedside requiring verbal/tactile cueing to keep trunk in midline, limited to standing for up to 2-3 minutes with flexed trunk, buckling of knees and unable to transfer to chair.  Patient required Max assist to reposition when put back to bed.  Patient will benefit from continued skilled physical therapy in hospital and recommended venue below to increase strength, balance, endurance for safe ADLs and gait.     Recommendations for follow up therapy are one component of a multi-disciplinary discharge planning process, led by the attending physician.  Recommendations may be updated based on patient status, additional functional criteria and insurance authorization.  Follow Up Recommendations  Can patient physically be transported by private vehicle: No    Assistance Recommended at Discharge Set up Supervision/Assistance  Patient can return home with the following A lot of help with walking and/or transfers;A lot of help with bathing/dressing/bathroom;Assistance with  cooking/housework;Assist for transportation;Help with stairs or ramp for entrance   Equipment Recommendations  None recommended by PT    Recommendations for Other Services       Precautions / Restrictions Precautions Precautions: Fall Precaution Comments: adduction pillow in bed Restrictions Weight Bearing Restrictions: Yes LLE Weight Bearing: Weight bearing as tolerated     Mobility  Bed Mobility Overal bed mobility: Needs Assistance Bed Mobility: Supine to Sit, Sit to Supine     Supine to sit: Mod assist, Max assist Sit to supine: Max assist   General bed mobility comments: increased time, labored movement with difficulty moving LLE due to hip pain    Transfers Overall transfer level: Needs assistance Equipment used: Rolling walker (2 wheels) Transfers: Sit to/from Stand Sit to Stand: Max assist           General transfer comment: patient unable to extend trunk when standing using RW due to weakness    Ambulation/Gait                   Stairs             Wheelchair Mobility    Modified Rankin (Stroke Patients Only)       Balance Overall balance assessment: Needs assistance Sitting-balance support: Feet supported, No upper extremity supported Sitting balance-Leahy Scale: Poor Sitting balance - Comments: fair/poor seated at EOB Postural control: Right lateral lean Standing balance support: Reliant on assistive device for balance, No upper extremity supported, During functional activity Standing balance-Leahy Scale: Poor Standing balance comment: using RW  Cognition Arousal/Alertness: Awake/alert Behavior During Therapy: WFL for tasks assessed/performed Overall Cognitive Status: Within Functional Limits for tasks assessed                                          Exercises General Exercises - Lower Extremity Long Arc Quad: 10 reps, AROM, Seated, Strengthening, Both Toe Raises:  AROM, Seated, Strengthening, Both, 10 reps Heel Raises: Seated, AROM, Strengthening, Both, 10 reps    General Comments        Pertinent Vitals/Pain Pain Assessment Pain Assessment: Faces Faces Pain Scale: Hurts a little bit Pain Location: L hip Pain Descriptors / Indicators: Grimacing, Sore Pain Intervention(s): Limited activity within patient's tolerance, Monitored during session, Repositioned    Home Living                          Prior Function            PT Goals (current goals can now be found in the care plan section) Acute Rehab PT Goals Patient Stated Goal: return home after rehab Time For Goal Achievement: 02/11/23 Potential to Achieve Goals: Good Progress towards PT goals: Progressing toward goals    Frequency    Min 3X/week      PT Plan Current plan remains appropriate    Co-evaluation              AM-PAC PT "6 Clicks" Mobility   Outcome Measure  Help needed turning from your back to your side while in a flat bed without using bedrails?: A Lot Help needed moving from lying on your back to sitting on the side of a flat bed without using bedrails?: A Lot Help needed moving to and from a bed to a chair (including a wheelchair)?: A Lot Help needed standing up from a chair using your arms (e.g., wheelchair or bedside chair)?: A Lot Help needed to walk in hospital room?: A Lot Help needed climbing 3-5 steps with a railing? : Total 6 Click Score: 11    End of Session Equipment Utilized During Treatment: Oxygen Activity Tolerance: Patient tolerated treatment well;Patient limited by fatigue Patient left: in bed;with call bell/phone within reach;with family/visitor present Nurse Communication: Mobility status PT Visit Diagnosis: Unsteadiness on feet (R26.81);Other abnormalities of gait and mobility (R26.89);Muscle weakness (generalized) (M62.81);Pain Pain - Right/Left: Left Pain - part of body: Hip     Time: 4142-3953 PT Time  Calculation (min) (ACUTE ONLY): 23 min  Charges:  $Therapeutic Exercise: 8-22 mins $Therapeutic Activity: 8-22 mins                     2:48 PM, 02/02/23 Ocie Bob, MPT Physical Therapist with [facility] [phone] office [phone] mobile phone

## 2023-02-02 NOTE — TOC Progression Note (Signed)
Transition of Care [facility]) - Progression Note    Patient Details  Name: Gerald Hall MRN: [id number] Date of Birth: July 25, 1935  [facility] CM/SW Contact  Karn Cassis, [place] Phone Number: 02/02/2023, 1:49 PM  Clinical Narrative: LCSW updated [facility] and pt's daughter. Anticipate d/c to SNF tomorrow. Per CMA, authorization good through tomorrow night.       Expected Discharge Plan: Skilled Nursing Facility Barriers to Discharge: Continued Medical Work up  Expected Discharge Plan and Services In-house Referral: Clinical Social Work     Living arrangements for the past 2 months: Single Family Home Expected Discharge Date: 01/31/23                                     Social Determinants of Health (SDOH) Interventions SDOH Screenings   Food Insecurity: No Food Insecurity (01/26/2023)  Housing: Low Risk  (01/26/2023)  Transportation Needs: No Transportation Needs (01/26/2023)  Utilities: Not At Risk (01/26/2023)  Alcohol Screen: Low Risk  (03/16/2022)  Depression (PHQ2-9): Low Risk  (01/12/2023)  Financial Resource Strain: Low Risk  (03/16/2022)  Physical Activity: Sufficiently Active (03/16/2022)  Social Connections: Moderately Integrated (03/16/2022)  Stress: No Stress Concern Present (03/16/2022)  Tobacco Use: High Risk (01/27/2023)    Readmission Risk Interventions     No data to display

## 2023-02-02 NOTE — Progress Notes (Signed)
EKG done and given to nurse 

## 2023-02-02 NOTE — Progress Notes (Signed)
Occupational Therapy Treatment Patient Details Name: Gerald Hall MRN: [id number] DOB: [birth year] Today's Date: 02/02/2023   History of present illness Gerald Hall is a 88 y.o. male with medical history significant for BPH, atria fib, COPD with chronic respiratory failure, CKD3.  Patient was brought to the ED with reports of a fall and subsequent left hip pain.  At baseline patient ambulates with a walker.  Today he bent over to pick something from the floor (Tissue) and fell over onto his left hip.  He otherwise has no chest pain or difficulty breathing, no dizziness.  No memory problems.  He has a caregiver that lives with him.  No cough.  Swelling to his bilateral lower extremities that is at baseline today.  He is on chronic O2.   OT comments  Pt agreeable to OT treatment. Pt required mod A to max A for bed mobility today. Pt is still very weak with poor endurance. More difficulty noted today for seated balance. Pt leaning to R side much of the time at EOB with need for frequent verbal and tactile cuing to sit upright. Max A needed for sit to stand today. Pt struggled to boost from EOB and was unable to take steps when standing. Despite this, pt did stand twice and remained standing for >10 seconds each time. Pt will benefit from continued OT in the hospital and recommended venue below to increase strength, balance, and endurance for safe ADL's.      Recommendations for follow up therapy are one component of a multi-disciplinary discharge planning process, led by the attending physician.  Recommendations may be updated based on patient status, additional functional criteria and insurance authorization.    Assistance Recommended at Discharge Frequent or constant Supervision/Assistance  Patient can return home with the following  Two people to help with walking and/or transfers;A lot of help with bathing/dressing/bathroom;Assistance with cooking/housework;Assist for transportation;Help with  stairs or ramp for entrance   Equipment Recommendations  None recommended by OT          Precautions / Restrictions Precautions Precautions: Fall;Posterior Hip Precaution Comments: adduction pillow in bed Restrictions Weight Bearing Restrictions: Yes LLE Weight Bearing: Weight bearing as tolerated       Mobility Bed Mobility Overal bed mobility: Needs Assistance Bed Mobility: Supine to Sit, Sit to Supine     Supine to sit: Mod assist Sit to supine: Max assist   General bed mobility comments: Slow labored movment. Assist to move L LE and pull to sit.    Transfers Overall transfer level: Needs assistance Equipment used: Rolling walker (2 wheels) Transfers: Sit to/from Stand Sit to Stand: Max assist           General transfer comment: 2 reps of max A standing with RW today; One partial stand to scoot to head of bed without RW. Labored movement; weakness; poor endurance.     Balance Overall balance assessment: Needs assistance Sitting-balance support: Feet supported, No upper extremity supported Sitting balance-Leahy Scale: Poor Sitting balance - Comments: fair to poor seated at EOB Postural control: Right lateral lean Standing balance support: Reliant on assistive device for balance, During functional activity, Bilateral upper extremity supported Standing balance-Leahy Scale: Poor Standing balance comment: using RW                            Cognition Arousal/Alertness: Awake/alert Behavior During Therapy: WFL for tasks assessed/performed Overall Cognitive Status: Within Functional Limits for tasks assessed  Exercises Exercises: Other exercises Other Exercises Other Exercises: ~x3 reps of anterior leaning at EOB to strech back. Helf for a few seconds each time.                 Pertinent Vitals/ Pain       Pain Assessment Pain Assessment: No/denies pain                                                           Frequency  Min 2X/week        Progress Toward Goals  OT Goals(current goals can now be found in the care plan section)  Progress towards OT goals: Progressing toward goals  Acute Rehab OT Goals Patient Stated Goal: Pt and family open to rehab to get stronger. OT Goal Formulation: With patient/family Time For Goal Achievement: 02/11/23 Potential to Achieve Goals: Good ADL Goals Pt Will Perform Grooming: with set-up;sitting Pt Will Perform Lower Body Bathing: with mod assist;with min assist;sitting/lateral leans;with adaptive equipment Pt Will Perform Upper Body Dressing: with set-up;sitting Pt Will Perform Lower Body Dressing: with mod assist;with min assist;with adaptive equipment;sitting/lateral leans Pt Will Transfer to Toilet: with min assist;stand pivot transfer;with min guard assist Pt Will Perform Toileting - Clothing Manipulation and hygiene: with min guard assist;sitting/lateral leans Pt/caregiver will Perform Home Exercise Program: Increased strength;Increased ROM;Both right and left upper extremity;Independently;With written HEP provided  Plan Discharge plan remains appropriate                                    End of Session Equipment Utilized During Treatment: Rolling walker (2 wheels);Oxygen  OT Visit Diagnosis: Unsteadiness on feet (R26.81);Other abnormalities of gait and mobility (R26.89);Muscle weakness (generalized) (M62.81)   Activity Tolerance Patient tolerated treatment well   Patient Left in bed;with call bell/phone within reach;with family/visitor present   Nurse Communication          Time: 7215-8727 OT Time Calculation (min): 28 min  Charges: OT General Charges $OT Visit: 1 Visit OT Treatments $Therapeutic Exercise: 23-37 mins  Gerald Hall OT, MOT  Danie Chandler 02/02/2023, 9:59 AM

## 2023-02-02 NOTE — Progress Notes (Signed)
Pt's blood pressure was 87/52 and MAP 64. MD notified. 250 mL bolus given and BP came up to 107/55, MAP 71. Pt hypotensive this a.m. with a BP of 91/53. MAP 65. MD made aware. Pt had multiple bowel movements during the night.

## 2023-02-02 NOTE — Progress Notes (Signed)
PROGRESS NOTE    Gerald Hall  WUJ:[id number] DOB: [birth year] DOA: 01/26/2023 PCP: Babs SciaraLuking, Scott Marietta Sikkema, MD  Chief Complaint  Patient presents with   Fall    Left hip pain    Brief Narrative:   Gerald Hall is [name] 88 y.o. male with medical history significant for BPH, atria fib, COPD with chronic respiratory failure, CKD3.  Patient was brought to the ED with reports of Gerald Hall fall and subsequent left hip pain.  At baseline patient ambulates with Kapri Nero walker.  Patient was admitted with closed displaced fracture of left femoral neck and orthopedics to see.  Cardiology consult for clearance completed and he underwent left hip hemiarthroplasty 4/4.  He was being set up for discharge on 4/8 to SNF and then suddenly began having some dry heaving as well as some diarrhea after he received an enema to help with bowel movement.  Plan will be to further evaluate for GI pathogens.  Holding diuretics for now.  Family members are quite concerned and he does not appear to be feeling well today.   Assessment & Plan:   Principal Problem:   Closed displaced fracture of left femoral neck Active Problems:   Secondary cardiomyopathy   Chronic atrial fibrillation, unspecified   COPD (chronic obstructive pulmonary disease)   HTN (hypertension), benign   Hyperthyroidism   CKD stage 3a, GFR 45-59 ml/min   Chronic respiratory failure with hypoxia   Acute on chronic HFrEF (heart failure with reduced ejection fraction)   Prolonged Q-T interval on ECG  Closed displaced fracture of left femoral neck status post left hip hemiarthroplasty 4/4 Status post mechanical fall.  Pelvic x-ray shows acute displaced left femoral neck fracture. -IV morphine 2 mg every 4 hourly as needed -PT/OT evaluation recommending SNF and patient may discharge once more clinically stable   Intractable nausea and vomiting and diarrhea -Noted after enema -follow kub -GI pathogen panel is pending   Acute on Chronic HFrEF - continue coreg  (entresto and jardiance on hold for soft BP) - CXR 4/9 with no pulmonary edema or focal pulm consolidation - diuresis on hold  - appreciate cardiology recs - currently getting IVF   COPD (chronic obstructive pulmonary disease) Diffuse wheezing on exam, no cough, denies dyspnea, reports he missed 2 doses of his bronchodilators.  On 2 L chronically. -Resume DuoNebs, and home regimen   Chronic atrial fibrillation, unspecified amiodarone and carvedilol.  Not on anticoagulation.  Follows with Dr. Cindra EvesMacdowell.   -Resume amiodarone, carvedilol   QT prolongation - per cards - avoid QT prolonging meds  CKD stage 3a, GFR 45-59 ml/min Creatinine stable- 1.38. Entresto on hold with issues with BP (jardiance also on hold) IVF per cards, will follow in the AM   Hyperthyroidism Resume methimazole   HTN (hypertension), benign BP soft today Holding entresto, jardiance.  Continue coreg with holding parameters. Flomax.   Chronic respiratory failure with hypoxia On 2 L.   Obesity BMI 30.77    DVT prophylaxis: BID ASA Code Status: DNR Family Communication: daughter at bedside Disposition:   Status is: Inpatient Remains inpatient appropriate because: continued need for inpatient care   Consultants:  Ortho cards  Procedures:  4/4 Left hip hemiarthroplasty    Antimicrobials:  Anti-infectives (From admission, onward)    Start     Dose/Rate Route Frequency Ordered Stop   01/27/23 2300  ceFAZolin (ANCEF) IVPB 2g/100 mL premix        2 g 200 mL/hr over 30 Minutes Intravenous Every 8  hours 01/27/23 1819 01/28/23 1459   01/27/23 1614  vancomycin (VANCOCIN) powder  Status:  Discontinued          As needed 01/27/23 1614 01/27/23 1819   01/27/23 1200  ceFAZolin (ANCEF) IVPB 2g/100 mL premix        2 g 200 mL/hr over 30 Minutes Intravenous On call to O.R. 01/26/23 1951 01/27/23 1444       Subjective: No complaints  Objective: Vitals:   02/02/23 0429 02/02/23 0823 02/02/23  1321 02/02/23 1326  BP: (!) 91/53 (!) 113/59 (!) 99/52   Pulse: 78 77 74 76  Resp: 18 18  18   Temp: 98.6 F (37 C) 98.2 F (36.8 C) 99.2 F (37.3 C)   TempSrc: Oral Oral Oral   SpO2: 96%  95% 96%  Weight:      Height:        Intake/Output Summary (Last 24 hours) at 02/02/2023 1741 Last data filed at 02/02/2023 1300 Gross per 24 hour  Intake 480 ml  Output 1450 ml  Net -970 ml   Filed Weights   01/26/23 1709 01/26/23 2134 01/27/23 1320  Weight: 90.7 kg 91.8 kg 91.8 kg    Examination:  General exam: Appears calm and comfortable  Respiratory system: unlabored Cardiovascular system: RRR Gastrointestinal system: Abdomen is nondistended, soft and nontender Central nervous system: Alert and oriented. No focal neurological deficits. Extremities: no LEE   Data Reviewed: I have personally reviewed following labs and imaging studies  CBC: Recent Labs  Lab 01/26/23 1823 01/27/23 0516 01/29/23 0448 01/30/23 0422 01/31/23 0439 02/01/23 0913 02/02/23 0439  WBC 13.2*   < > 12.8* 11.3* 10.7* 17.4* 19.5*  NEUTROABS 10.0*  --   --   --   --   --   --   HGB 16.0   < > 13.6 12.7* 12.8* 13.5 12.1*  HCT 50.3   < > 42.5 41.0 40.5 43.1 39.4  MCV 98.6   < > 97.7 99.5 99.8 100.2* 101.0*  PLT 202   < > 203 193 209 240 236   < > = values in this interval not displayed.    Basic Metabolic Panel: Recent Labs  Lab 01/29/23 0448 01/30/23 0422 01/31/23 0439 02/01/23 0913 02/02/23 0439  NA 140 139 137 137 138  K 3.1* 4.0 4.0 4.5 4.0  CL 101 102 101 101 103  CO2 30 29 28 28 28   GLUCOSE 110* 107* 110* 174* 144*  BUN 34* 37* 44* 53* 64*  CREATININE 1.56* 1.45* 1.59* 1.79* 1.96*  CALCIUM 8.8* 8.9 9.0 9.4 9.2  MG 2.4 2.5* 2.7* 3.0* 3.1*    GFR: Estimated Creatinine Clearance: 30.3 mL/min (Nitika Jackowski) (by C-G formula based on SCr of 1.96 mg/dL (H)).  Liver Function Tests: Recent Labs  Lab 01/26/23 1823  AST 16  ALT 16  ALKPHOS 95  BILITOT 0.6  PROT 7.3  ALBUMIN 3.8    CBG: No  results for input(s): "GLUCAP" in the last 168 hours.   Recent Results (from the past 240 hour(s))  Surgical pcr screen     Status: None   Collection Time: 01/26/23  9:50 PM   Specimen: Nasal Mucosa; Nasal Swab  Result Value Ref Range Status   MRSA, PCR NEGATIVE NEGATIVE Final   Staphylococcus aureus NEGATIVE NEGATIVE Final    Comment: (NOTE) The Xpert SA Assay (FDA approved for NASAL specimens in patients 31 years of age and older), is one component of Karson Chicas comprehensive surveillance program. It is not intended  to diagnose infection nor to guide or monitor treatment. Performed at [facility], [address], [place], [place] [address]          Radiology Studies: DG CHEST PORT 1 VIEW  Result Date: 02/01/2023 CLINICAL DATA:  Aspiration into airway EXAM: PORTABLE CHEST 1 VIEW COMPARISON:  01/26/2023 FINDINGS: Transverse diameter of heart is increased. There is poor inspiration. Lung fields are clear of any infiltrates or pulmonary edema. Small linear densities are seen in left lower lung field. There is no pleural effusion or pneumothorax. Degenerative changes are noted in right shoulder. IMPRESSION: Cardiomegaly. Small linear densities in left lower lung field suggest subsegmental atelectasis. There are no signs of pulmonary edema or focal pulmonary consolidation. Electronically Signed   By: Ernie Avena M.D.   On: 02/01/2023 12:10        Scheduled Meds:  amiodarone  100 mg Oral Daily   aspirin EC  81 mg Oral BID   budesonide (PULMICORT) nebulizer solution  0.25 mg Nebulization BID   carvedilol  3.125 mg Oral BID WC   dextromethorphan-guaiFENesin  1 tablet Oral Daily   empagliflozin  10 mg Oral Daily   finasteride  5 mg Oral Daily   fluticasone  2 spray Each Nare QHS   methimazole  5 mg Oral Daily   mirabegron ER  50 mg Oral Daily   polyethylene glycol  17 g Oral Daily   rOPINIRole  2 mg Oral BID   tamsulosin  0.4 mg Oral BID   Continuous Infusions:  sodium chloride  75 mL/hr at 02/02/23 1309     LOS: 7 days    Time spent: over 30 min    Lacretia Nicks, MD [facility]   To contact the attending provider between 7A-7P or the covering provider during after hours 7P-7A, please log into the web site [email or web address] and access using universal [place] password for that web site. If you do not have the password, please call the hospital operator.  02/02/2023, 5:41 PM

## 2023-02-02 NOTE — Progress Notes (Signed)
   02/02/23 1321  Vitals  Temp 99.2 F (37.3 C)  Temp Source Oral  BP (!) 99/52  MAP (mmHg) 66  BP Location Left Arm  BP Method Automatic  Patient Position (if appropriate) Lying  Pulse Rate 74  MEWS COLOR  MEWS Score Color Green  Oxygen Therapy  SpO2 95 %  O2 Device Nasal Cannula  MEWS Score  MEWS Temp 0  MEWS Systolic 1  MEWS Pulse 0  MEWS RR 0  MEWS LOC 0  MEWS Score 1  Provider Notification  Provider Name/Title Dr Lowell Guitar  Date Provider Notified 02/02/23  Time Provider Notified 1230  Method of Notification Page  Notification Reason Other (Comment) (B/P 99/52,temp 99.2 oral)  Provider response No new orders

## 2023-02-03 DIAGNOSIS — S72002A Fracture of unspecified part of neck of left femur, initial encounter for closed fracture: Secondary | ICD-10-CM | POA: Diagnosis not present

## 2023-02-03 LAB — URINALYSIS, ROUTINE W REFLEX MICROSCOPIC
Bilirubin Urine: NEGATIVE
Glucose, UA: 150 mg/dL — AB
Hgb urine dipstick: NEGATIVE
Ketones, ur: NEGATIVE mg/dL
Leukocytes,Ua: NEGATIVE
Nitrite: NEGATIVE
Protein, ur: NEGATIVE mg/dL
Specific Gravity, Urine: 1.015 (ref 1.005–1.030)
pH: 5 (ref 5.0–8.0)

## 2023-02-03 LAB — CBC WITH DIFFERENTIAL/PLATELET
Abs Immature Granulocytes: 0.75 10*3/uL — ABNORMAL HIGH (ref 0.00–0.07)
Basophils Absolute: 0.1 10*3/uL (ref 0.0–0.1)
Basophils Relative: 0 %
Eosinophils Absolute: 0.2 10*3/uL (ref 0.0–0.5)
Eosinophils Relative: 1 %
HCT: 40.3 % (ref 39.0–52.0)
Hemoglobin: 12.1 g/dL — ABNORMAL LOW (ref 13.0–17.0)
Immature Granulocytes: 4 %
Lymphocytes Relative: 9 %
Lymphs Abs: 1.4 10*3/uL (ref 0.7–4.0)
MCH: 31 pg (ref 26.0–34.0)
MCHC: 30 g/dL (ref 30.0–36.0)
MCV: 103.3 fL — ABNORMAL HIGH (ref 80.0–100.0)
Monocytes Absolute: 2.2 10*3/uL — ABNORMAL HIGH (ref 0.1–1.0)
Monocytes Relative: 13 %
Neutro Abs: 12.2 10*3/uL — ABNORMAL HIGH (ref 1.7–7.7)
Neutrophils Relative %: 73 %
Platelets: 237 10*3/uL (ref 150–400)
RBC: 3.9 MIL/uL — ABNORMAL LOW (ref 4.22–5.81)
RDW: 14.6 % (ref 11.5–15.5)
WBC: 16.9 10*3/uL — ABNORMAL HIGH (ref 4.0–10.5)
nRBC: 0 % (ref 0.0–0.2)

## 2023-02-03 LAB — COMPREHENSIVE METABOLIC PANEL
ALT: 34 U/L (ref 0–44)
AST: 29 U/L (ref 15–41)
Albumin: 2.5 g/dL — ABNORMAL LOW (ref 3.5–5.0)
Alkaline Phosphatase: 118 U/L (ref 38–126)
Anion gap: 4 — ABNORMAL LOW (ref 5–15)
BUN: 55 mg/dL — ABNORMAL HIGH (ref 8–23)
CO2: 28 mmol/L (ref 22–32)
Calcium: 9.2 mg/dL (ref 8.9–10.3)
Chloride: 105 mmol/L (ref 98–111)
Creatinine, Ser: 1.69 mg/dL — ABNORMAL HIGH (ref 0.61–1.24)
GFR, Estimated: 39 mL/min — ABNORMAL LOW (ref >60)
Glucose, Bld: 98 mg/dL (ref 70–99)
Potassium: 3.6 mmol/L (ref 3.5–5.1)
Sodium: 137 mmol/L (ref 135–145)
Total Bilirubin: 1 mg/dL (ref 0.3–1.2)
Total Protein: 6.1 g/dL — ABNORMAL LOW (ref 6.5–8.1)

## 2023-02-03 LAB — BLOOD GAS, VENOUS
Acid-Base Excess: 6.3 mmol/L — ABNORMAL HIGH (ref 0.0–2.0)
Bicarbonate: 32.3 mmol/L — ABNORMAL HIGH (ref 20.0–28.0)
Drawn by: 432
O2 Saturation: 91.3 %
Patient temperature: 37.1
pCO2, Ven: 51 mmHg (ref 44–60)
pH, Ven: 7.41 (ref 7.25–7.43)
pO2, Ven: 62 mmHg — ABNORMAL HIGH (ref 32–45)

## 2023-02-03 LAB — PHOSPHORUS: Phosphorus: 2.9 mg/dL (ref 2.5–4.6)

## 2023-02-03 LAB — MAGNESIUM: Magnesium: 3 mg/dL — ABNORMAL HIGH (ref 1.7–2.4)

## 2023-02-03 MED ORDER — EMPAGLIFLOZIN 10 MG PO TABS
10.0000 mg | ORAL_TABLET | Freq: Every day | ORAL | Status: DC
  Administered 2023-02-03 – 2023-02-04 (×2): 10 mg via ORAL
  Filled 2023-02-03 (×2): qty 1

## 2023-02-03 MED ORDER — TORSEMIDE 20 MG PO TABS
10.0000 mg | ORAL_TABLET | ORAL | Status: DC
  Administered 2023-02-04: 10 mg via ORAL
  Filled 2023-02-03: qty 1

## 2023-02-03 NOTE — Progress Notes (Signed)
Rounding Note    Patient Name: Gerald Hall Date of Encounter: 02/03/2023   [facility] Cardiologist: Nona Dell, MD   Subjective  Breathing is at baseline.   Inpatient Medications    Scheduled Meds:  amiodarone  100 mg Oral Daily   aspirin EC  81 mg Oral BID   budesonide (PULMICORT) nebulizer solution  0.25 mg Nebulization BID   carvedilol  3.125 mg Oral BID WC   dextromethorphan-guaiFENesin  1 tablet Oral Daily   finasteride  5 mg Oral Daily   fluticasone  2 spray Each Nare QHS   methimazole  5 mg Oral Daily   mirabegron ER  50 mg Oral Daily   polyethylene glycol  17 g Oral Daily   rOPINIRole  2 mg Oral BID   tamsulosin  0.4 mg Oral BID   Continuous Infusions:  PRN Meds: acetaminophen **OR** acetaminophen, ipratropium-albuterol, morphine injection, traMADol   Vital Signs    Vitals:   02/02/23 1949 02/02/23 2130 02/02/23 2224 02/03/23 0444  BP:  109/71 (!) 105/55 (!) 122/56  Pulse:  72 70 78  Resp:  20 20 20   Temp:  98.1 F (36.7 C) 98.3 F (36.8 C) 98.7 F (37.1 C)  TempSrc:  Oral  Oral  SpO2: 95% 94% 94% 95%  Weight:      Height:        Intake/Output Summary (Last 24 hours) at 02/03/2023 0851 Last data filed at 02/03/2023 0700 Gross per 24 hour  Intake 1078 ml  Output 600 ml  Net 478 ml      01/27/2023    1:20 PM 01/26/2023    9:34 PM 01/26/2023    5:09 PM  Last 3 Weights  Weight (lbs) 202 lb 6.1 oz 202 lb 6.1 oz 200 lb  Weight (kg) 91.8 kg 91.8 kg 90.719 kg      Telemetry    N/a - Personally Reviewed  ECG    N/A - Personally Reviewed  Physical Exam   GEN: No acute distress.   Neck: No JVD Cardiac: RRR, no murmurs, rubs, or gallops.  Respiratory: bilateral wheezing GI: Soft, nontender, non-distended  MS: No edema; No deformity. Neuro:  Nonfocal  Psych: Normal affect   Labs    High Sensitivity Troponin:  No results for input(s): "TROPONINIHS" in the last 720 hours.   Chemistry Recent Labs  Lab 02/01/23 0913  02/02/23 0439 02/03/23 0451  NA 137 138 137  K 4.5 4.0 3.6  CL 101 103 105  CO2 28 28 28   GLUCOSE 174* 144* 98  BUN 53* 64* 55*  CREATININE 1.79* 1.96* 1.69*  CALCIUM 9.4 9.2 9.2  MG 3.0* 3.1* 3.0*  PROT  --   --  6.1*  ALBUMIN  --   --  2.5*  AST  --   --  29  ALT  --   --  34  ALKPHOS  --   --  118  BILITOT  --   --  1.0  GFRNONAA 37* 33* 39*  ANIONGAP 8 7 4*    Lipids No results for input(s): "CHOL", "TRIG", "HDL", "LABVLDL", "LDLCALC", "CHOLHDL" in the last 168 hours.  Hematology Recent Labs  Lab 02/01/23 0913 02/02/23 0439 02/03/23 0451  WBC 17.4* 19.5* 16.9*  RBC 4.30 3.90* 3.90*  HGB 13.5 12.1* 12.1*  HCT 43.1 39.4 40.3  MCV 100.2* 101.0* 103.3*  MCH 31.4 31.0 31.0  MCHC 31.3 30.7 30.0  RDW 14.6 14.7 14.6  PLT 240 236 237  Thyroid No results for input(s): "TSH", "FREET4" in the last 168 hours.  BNPNo results for input(s): "BNP", "PROBNP" in the last 168 hours.  DDimer No results for input(s): "DDIMER" in the last 168 hours.   Radiology    DG Abd 1 View  Result Date: 02/02/2023 CLINICAL DATA:  History of constipation EXAM: ABDOMEN - 1 VIEW COMPARISON:  None Available. FINDINGS: Scattered large and small bowel gas is noted. No obstructive changes are seen. Bilateral hip replacements are noted. Degenerative changes of the thoracolumbar spine are seen. No abnormal mass is noted. IMPRESSION: No obstructive changes are noted. Electronically Signed   By: Alcide Clever M.D.   On: 02/02/2023 18:55   DG CHEST PORT 1 VIEW  Result Date: 02/01/2023 CLINICAL DATA:  Aspiration into airway EXAM: PORTABLE CHEST 1 VIEW COMPARISON:  01/26/2023 FINDINGS: Transverse diameter of heart is increased. There is poor inspiration. Lung fields are clear of any infiltrates or pulmonary edema. Small linear densities are seen in left lower lung field. There is no pleural effusion or pneumothorax. Degenerative changes are noted in right shoulder. IMPRESSION: Cardiomegaly. Small linear  densities in left lower lung field suggest subsegmental atelectasis. There are no signs of pulmonary edema or focal pulmonary consolidation. Electronically Signed   By: Ernie Avena M.D.   On: 02/01/2023 12:10    Cardiac Studies    Patient Profile     88 y.o. male with a hx of HFrEF (EF 45-50% in 02/2019, at 40-45% in 12/2021 and 30-35% in 11/2022), pSVT, COPD (on 2L [place] at baseline), HTN, HLD, hyperthyroidism and temporal arteritis who is currently admitted after suffering a mechanical fall and having an acute displaced femoral neck fracture. Underwent repair on 01/27/2023.   Assessment & Plan    1.Acute on chronic HFrEF - 02/2019 echo LVEF 45-50% - 12/2021 echo LVEF 40-45%, grade I dd - 11/2022 echo LVEF: 30-35%     - reports he is at his baseline from breathing standpoint. Repeat CXR  no pulm edema.  -medical therapy limited by soft bp's at times, renal dysfunction     - medical therapy with coreg 3.125mg  bid, jardiance 10mg  which is ok to continue. Entresto on hold for AKI and soft bp's.  - would hold entresto until f/u, pending bp's and labs could restrt at that time vs low dose ARB.   - gentle IVFs yesterday, renal function improving with improved bp's.  - can start torsemide 10mg  qod starting tomorrow.    2. Long QTc - hold prolonging meds - difficult to calculate manually given first degree block with P wave on end of [place]. Accounting for RBBB my calculated QTc is , avoid additional prolonging agents.    3. COPD on home O2   4. S/p left hip hemiarthroplasty 01/27/23 - presented after mechanical fall   5. PSVT - EKG with SR, QTc 480. Reasonable to continue low dose amio with this QTc on amio given lower risk of QT induced ventricular arrhythmias compared with other class III antiarrhythmics -nonsustained runs of SVT now in SR, continue current meds   6. N/V/D - per primary team  We will sign off inpatient care     For questions or updates, please contact  [facility] Please consult [email or web address] for contact info under        Signed, Dina Rich, MD  02/03/2023, 8:51 AM

## 2023-02-03 NOTE — Progress Notes (Signed)
Found Gerald Hall sitting up in bed today and eating lunch. He had a smile on his face and shared that he felt a little better besides having hip pain. Chaplain engaged him in reflection today and he was grateful for support; provided prayer bedside. No immediate spiritual needs at this time. Chaplain will remain available in order to provide spiritual support and to assess for spiritual need.   Rev. Jolyn Lent, M.Div Chaplain

## 2023-02-03 NOTE — TOC Progression Note (Signed)
Transition of Care [facility]) - Progression Note    Patient Details  Name: Gerald Hall MRN: [id number] Date of Birth: 1934/11/01  [facility] CM/SW Contact  [name], [place] Phone Number: 02/03/2023, 1:59 PM  Clinical Narrative:    CSW updated that pt will not be ready for D/C today. CSW updated admissions with [facility] of plan for pt to stay overnight. TOC to restart auth. TOC to follow.   Expected Discharge Plan: Skilled Nursing Facility Barriers to Discharge: Continued Medical Work up  Expected Discharge Plan and Services In-house Referral: Clinical Social Work     Living arrangements for the past 2 months: Single Family Home Expected Discharge Date: 01/31/23                                     Social Determinants of Health (SDOH) Interventions SDOH Screenings   Food Insecurity: No Food Insecurity (01/26/2023)  Housing: Low Risk  (01/26/2023)  Transportation Needs: No Transportation Needs (01/26/2023)  Utilities: Not At Risk (01/26/2023)  Alcohol Screen: Low Risk  (03/16/2022)  Depression (PHQ2-9): Low Risk  (01/12/2023)  Financial Resource Strain: Low Risk  (03/16/2022)  Physical Activity: Sufficiently Active (03/16/2022)  Social Connections: Moderately Integrated (03/16/2022)  Stress: No Stress Concern Present (03/16/2022)  Tobacco Use: High Risk (01/27/2023)    Readmission Risk Interventions     No data to display

## 2023-02-03 NOTE — Progress Notes (Signed)
PROGRESS NOTE    Gerald Hall  OFV:[id number] DOB: 22-Sep-1935 DOA: 01/26/2023 PCP: Babs Sciara, MD  Chief Complaint  Patient presents with   Fall    Left hip pain    Brief Narrative:   Gerald Hall is Gerald Hall 88 y.o. male with medical history significant for BPH, atria fib, COPD with chronic respiratory failure, CKD3.  Patient was brought to the ED with reports of Gerald Hall fall and subsequent left hip pain.  At baseline patient ambulates with Gerald Hall walker.  Patient was admitted with closed displaced fracture of left femoral neck and orthopedics to see.  Cardiology consult for clearance completed and he underwent left hip hemiarthroplasty 4/4.  He was being set up for discharge on 4/8 to SNF and then suddenly began having some dry heaving as well as some diarrhea after he received an enema to help with bowel movement.   Some delirium today as well as diarrhea.  Evaluating as below.  Possible d/c 4/12 if he improves.  Assessment & Plan:   Principal Problem:   Closed displaced fracture of left femoral neck Active Problems:   Secondary cardiomyopathy   Chronic atrial fibrillation, unspecified   COPD (chronic obstructive pulmonary disease)   HTN (hypertension), benign   Hyperthyroidism   CKD stage 3a, GFR 45-59 ml/min   Chronic respiratory failure with hypoxia   Acute on chronic HFrEF (heart failure with reduced ejection fraction)   Prolonged Q-T interval on ECG  Acute Metabolic Encephalopathy  Delirium Has mild delirium yesterday afternoon and this AM per RN UA not c/w UTI, autopeeping, VBG without hypercarbia Continue delirium precautions  Abdominal Distension  Diarrhea Issues with diarrhea since enema given Gerald Hall few days ago ? Continued constipation and overflow diarrhea vs diarrhea illness No impaction on exam Will monitor for now, if leaking (small volumes of loose stool) I think aggressive bowel regimen would be reasonable to try If having significant episodes of diarrhea, would  probably send for c diff/gi path given abdominal distension, mildly elevated WBC's and encephalopathy  Closed displaced fracture of left femoral neck status post left hip hemiarthroplasty 4/4 Status post mechanical fall.  Pelvic x-ray shows acute displaced left femoral neck fracture. -IV morphine 2 mg every 4 hourly as needed -PT/OT evaluation recommending SNF and patient may discharge once more clinically stable   Intractable nausea and vomiting and diarrhea -Noted after enema -follow kub - no obstructive changes -GI pathogen panel is pending   Acute on Chronic HFrEF - continue coreg (entresto and jardiance on hold for soft BP) - CXR 4/9 with no pulmonary edema or focal pulm consolidation - diuresis on hold plan for every other day torsemide starting 4/12 - appreciate cardiology recs - continue jardiance, coreg.  Entresto currently on hold - follow outpatient for resumption   COPD (chronic obstructive pulmonary disease) Diffuse wheezing on exam, no cough, denies dyspnea, reports he missed 2 doses of his bronchodilators.  On 2 L chronically. -Resume DuoNebs, and home regimen   Chronic atrial fibrillation, unspecified amiodarone and carvedilol.  Not on anticoagulation.  Follows with Dr. Cindra Eves.   -Resume amiodarone, carvedilol   QT prolongation - per cards - avoid QT prolonging meds  AKI on CKD stage 3a, GFR 45-59 ml/min Baseline 1.38 Creatinine gradually improving  Entresto on hold with issues with BP (jardiance also on hold) IVF per cards, will follow in the AM  Hyperthyroidism Resume methimazole   HTN (hypertension), benign BP improved (soft yesterday).  Jardiance resumed.  Continue coreg, flomax.  Sherryll Burger is on hold. Planning for 10 mg torsemide 4/12 every other day   Chronic respiratory failure with hypoxia On 2 L.   Obesity BMI 30.77    DVT prophylaxis: BID ASA Code Status: DNR Family Communication: daughter at bedside Disposition:   Status is:  Inpatient Remains inpatient appropriate because: continued need for inpatient care   Consultants:  Ortho cards  Procedures:  4/4 Left hip hemiarthroplasty    Antimicrobials:  Anti-infectives (From admission, onward)    Start     Dose/Rate Route Frequency Ordered Stop   01/27/23 2300  ceFAZolin (ANCEF) IVPB 2g/100 mL premix        2 g 200 mL/hr over 30 Minutes Intravenous Every 8 hours 01/27/23 1819 01/28/23 1459   01/27/23 1614  vancomycin (VANCOCIN) powder  Status:  Discontinued          As needed 01/27/23 1614 01/27/23 1819   01/27/23 1200  ceFAZolin (ANCEF) IVPB 2g/100 mL premix        2 g 200 mL/hr over 30 Minutes Intravenous On call to O.R. 01/26/23 1951 01/27/23 1444       Subjective: No complaints  Objective: Vitals:   02/02/23 2130 02/02/23 2224 02/03/23 0444 02/03/23 1339  BP: 109/71 (!) 105/55 (!) 122/56 (!) 128/51  Pulse: 72 70 78 68  Resp: 20 20 20 20   Temp: 98.1 F (36.7 C) 98.3 F (36.8 C) 98.7 F (37.1 C) 98.7 F (37.1 C)  TempSrc: Oral  Oral Oral  SpO2: 94% 94% 95% 97%  Weight:      Height:        Intake/Output Summary (Last 24 hours) at 02/03/2023 1523 Last data filed at 02/03/2023 0900 Gross per 24 hour  Intake 718 ml  Output 600 ml  Net 118 ml   Filed Weights   01/26/23 1709 01/26/23 2134 01/27/23 1320  Weight: 90.7 kg 91.8 kg 91.8 kg    Examination:  General: No acute distress. Cardiovascular: RRR Lungs: autopeeping (normal per SIL), unlabored Abdomen: significantly distended, nontender Rectal exam with RN at bedside, no stool in rectal vault  Neurological: Alert and oriented 3 when I spoke to him, but intermittent delirium earlier per RN. Moves all extremities 4 with equal strength. Cranial nerves II through XII grossly intact. Extremities:dressing to LLE  Data Reviewed: I have personally reviewed following labs and imaging studies  CBC: Recent Labs  Lab 01/30/23 0422 01/31/23 0439 02/01/23 0913 02/02/23 0439  02/03/23 0451  WBC 11.3* 10.7* 17.4* 19.5* 16.9*  NEUTROABS  --   --   --   --  12.2*  HGB 12.7* 12.8* 13.5 12.1* 12.1*  HCT 41.0 40.5 43.1 39.4 40.3  MCV 99.5 99.8 100.2* 101.0* 103.3*  PLT 193 209 240 236 237    Basic Metabolic Panel: Recent Labs  Lab 01/30/23 0422 01/31/23 0439 02/01/23 0913 02/02/23 0439 02/03/23 0451  NA 139 137 137 138 137  K 4.0 4.0 4.5 4.0 3.6  CL 102 101 101 103 105  CO2 29 28 28 28 28   GLUCOSE 107* 110* 174* 144* 98  BUN 37* 44* 53* 64* 55*  CREATININE 1.45* 1.59* 1.79* 1.96* 1.69*  CALCIUM 8.9 9.0 9.4 9.2 9.2  MG 2.5* 2.7* 3.0* 3.1* 3.0*  PHOS  --   --   --   --  2.9    GFR: Estimated Creatinine Clearance: 35.2 mL/min (Ramelo Oetken) (by C-G formula based on SCr of 1.69 mg/dL (H)).  Liver Function Tests: Recent Labs  Lab 02/03/23 0451  AST 29  ALT 34  ALKPHOS 118  BILITOT 1.0  PROT 6.1*  ALBUMIN 2.5*    CBG: No results for input(s): "GLUCAP" in the last 168 hours.   Recent Results (from the past 240 hour(s))  Surgical pcr screen     Status: None   Collection Time: 01/26/23  9:50 PM   Specimen: Nasal Mucosa; Nasal Swab  Result Value Ref Range Status   MRSA, PCR NEGATIVE NEGATIVE Final   Staphylococcus aureus NEGATIVE NEGATIVE Final    Comment: (NOTE) The Xpert SA Assay (FDA approved for NASAL specimens in patients 86 years of age and older), is one component of Oyuki Hogan comprehensive surveillance program. It is not intended to diagnose infection nor to guide or monitor treatment. Performed at [facility], [address] [place], [place] [address]          Radiology Studies: DG Abd 1 View  Result Date: 02/02/2023 CLINICAL DATA:  History of constipation EXAM: ABDOMEN - 1 VIEW COMPARISON:  None Available. FINDINGS: Scattered large and small bowel gas is noted. No obstructive changes are seen. Bilateral hip replacements are noted. Degenerative changes of the thoracolumbar spine are seen. No abnormal mass is noted. IMPRESSION: No obstructive  changes are noted. Electronically Signed   By: Alcide CleverMark  Lukens M.D.   On: 02/02/2023 18:55        Scheduled Meds:  amiodarone  100 mg Oral Daily   aspirin EC  81 mg Oral BID   budesonide (PULMICORT) nebulizer solution  0.25 mg Nebulization BID   carvedilol  3.125 mg Oral BID WC   dextromethorphan-guaiFENesin  1 tablet Oral Daily   empagliflozin  10 mg Oral Daily   finasteride  5 mg Oral Daily   fluticasone  2 spray Each Nare QHS   methimazole  5 mg Oral Daily   mirabegron ER  50 mg Oral Daily   polyethylene glycol  17 g Oral Daily   rOPINIRole  2 mg Oral BID   tamsulosin  0.4 mg Oral BID   [START ON 02/04/2023] torsemide  10 mg Oral QODAY   Continuous Infusions:     LOS: 8 days    Time spent: over 30 min    Lacretia Nicksaldwell Powell, MD [facility]   To contact the attending provider between 7A-7P or the covering provider during after hours 7P-7A, please log into the web site [email or web address] and access using universal [place] password for that web site. If you do not have the password, please call the hospital operator.  02/03/2023, 3:23 PM

## 2023-02-04 ENCOUNTER — Encounter (HOSPITAL_COMMUNITY): Payer: Self-pay | Admitting: Internal Medicine

## 2023-02-04 DIAGNOSIS — R4182 Altered mental status, unspecified: Secondary | ICD-10-CM | POA: Diagnosis not present

## 2023-02-04 DIAGNOSIS — K5900 Constipation, unspecified: Secondary | ICD-10-CM | POA: Diagnosis not present

## 2023-02-04 DIAGNOSIS — F17210 Nicotine dependence, cigarettes, uncomplicated: Secondary | ICD-10-CM | POA: Diagnosis not present

## 2023-02-04 DIAGNOSIS — R413 Other amnesia: Secondary | ICD-10-CM | POA: Diagnosis not present

## 2023-02-04 DIAGNOSIS — H9191 Unspecified hearing loss, right ear: Secondary | ICD-10-CM | POA: Diagnosis not present

## 2023-02-04 DIAGNOSIS — Z96641 Presence of right artificial hip joint: Secondary | ICD-10-CM | POA: Diagnosis not present

## 2023-02-04 DIAGNOSIS — I959 Hypotension, unspecified: Secondary | ICD-10-CM | POA: Diagnosis not present

## 2023-02-04 DIAGNOSIS — Z7982 Long term (current) use of aspirin: Secondary | ICD-10-CM | POA: Diagnosis not present

## 2023-02-04 DIAGNOSIS — Z96652 Presence of left artificial knee joint: Secondary | ICD-10-CM | POA: Diagnosis not present

## 2023-02-04 DIAGNOSIS — Z6831 Body mass index (BMI) 31.0-31.9, adult: Secondary | ICD-10-CM | POA: Diagnosis not present

## 2023-02-04 DIAGNOSIS — Z9842 Cataract extraction status, left eye: Secondary | ICD-10-CM | POA: Diagnosis not present

## 2023-02-04 DIAGNOSIS — E119 Type 2 diabetes mellitus without complications: Secondary | ICD-10-CM | POA: Diagnosis not present

## 2023-02-04 DIAGNOSIS — G9341 Metabolic encephalopathy: Secondary | ICD-10-CM

## 2023-02-04 DIAGNOSIS — I5023 Acute on chronic systolic (congestive) heart failure: Secondary | ICD-10-CM | POA: Diagnosis not present

## 2023-02-04 DIAGNOSIS — G20B2 Parkinson's disease with dyskinesia, with fluctuations: Secondary | ICD-10-CM | POA: Diagnosis not present

## 2023-02-04 DIAGNOSIS — S72002A Fracture of unspecified part of neck of left femur, initial encounter for closed fracture: Secondary | ICD-10-CM | POA: Diagnosis not present

## 2023-02-04 DIAGNOSIS — R9431 Abnormal electrocardiogram [ECG] [EKG]: Secondary | ICD-10-CM | POA: Diagnosis not present

## 2023-02-04 DIAGNOSIS — E785 Hyperlipidemia, unspecified: Secondary | ICD-10-CM | POA: Diagnosis not present

## 2023-02-04 DIAGNOSIS — H04123 Dry eye syndrome of bilateral lacrimal glands: Secondary | ICD-10-CM | POA: Diagnosis not present

## 2023-02-04 DIAGNOSIS — I5020 Unspecified systolic (congestive) heart failure: Secondary | ICD-10-CM | POA: Diagnosis not present

## 2023-02-04 DIAGNOSIS — H9190 Unspecified hearing loss, unspecified ear: Secondary | ICD-10-CM | POA: Diagnosis not present

## 2023-02-04 DIAGNOSIS — E669 Obesity, unspecified: Secondary | ICD-10-CM

## 2023-02-04 DIAGNOSIS — E559 Vitamin D deficiency, unspecified: Secondary | ICD-10-CM | POA: Diagnosis not present

## 2023-02-04 DIAGNOSIS — I2510 Atherosclerotic heart disease of native coronary artery without angina pectoris: Secondary | ICD-10-CM | POA: Diagnosis not present

## 2023-02-04 DIAGNOSIS — G8928 Other chronic postprocedural pain: Secondary | ICD-10-CM | POA: Diagnosis not present

## 2023-02-04 DIAGNOSIS — I429 Cardiomyopathy, unspecified: Secondary | ICD-10-CM | POA: Diagnosis not present

## 2023-02-04 DIAGNOSIS — R400 Somnolence: Secondary | ICD-10-CM | POA: Diagnosis not present

## 2023-02-04 DIAGNOSIS — I480 Paroxysmal atrial fibrillation: Secondary | ICD-10-CM | POA: Diagnosis not present

## 2023-02-04 DIAGNOSIS — R5381 Other malaise: Secondary | ICD-10-CM | POA: Diagnosis not present

## 2023-02-04 DIAGNOSIS — I340 Nonrheumatic mitral (valve) insufficiency: Secondary | ICD-10-CM | POA: Diagnosis not present

## 2023-02-04 DIAGNOSIS — G4700 Insomnia, unspecified: Secondary | ICD-10-CM | POA: Diagnosis not present

## 2023-02-04 DIAGNOSIS — N1832 Chronic kidney disease, stage 3b: Secondary | ICD-10-CM | POA: Diagnosis not present

## 2023-02-04 DIAGNOSIS — Z79899 Other long term (current) drug therapy: Secondary | ICD-10-CM | POA: Diagnosis not present

## 2023-02-04 DIAGNOSIS — J301 Allergic rhinitis due to pollen: Secondary | ICD-10-CM | POA: Diagnosis not present

## 2023-02-04 DIAGNOSIS — I471 Supraventricular tachycardia, unspecified: Secondary | ICD-10-CM | POA: Diagnosis not present

## 2023-02-04 DIAGNOSIS — I5033 Acute on chronic diastolic (congestive) heart failure: Secondary | ICD-10-CM | POA: Diagnosis not present

## 2023-02-04 DIAGNOSIS — Z720 Tobacco use: Secondary | ICD-10-CM | POA: Diagnosis not present

## 2023-02-04 DIAGNOSIS — J42 Unspecified chronic bronchitis: Secondary | ICD-10-CM | POA: Diagnosis not present

## 2023-02-04 DIAGNOSIS — Z9841 Cataract extraction status, right eye: Secondary | ICD-10-CM | POA: Diagnosis not present

## 2023-02-04 DIAGNOSIS — E0590 Thyrotoxicosis, unspecified without thyrotoxic crisis or storm: Secondary | ICD-10-CM | POA: Diagnosis not present

## 2023-02-04 DIAGNOSIS — Z7401 Bed confinement status: Secondary | ICD-10-CM | POA: Diagnosis not present

## 2023-02-04 DIAGNOSIS — Z96642 Presence of left artificial hip joint: Secondary | ICD-10-CM | POA: Diagnosis not present

## 2023-02-04 DIAGNOSIS — K5901 Slow transit constipation: Secondary | ICD-10-CM | POA: Diagnosis not present

## 2023-02-04 DIAGNOSIS — J690 Pneumonitis due to inhalation of food and vomit: Secondary | ICD-10-CM | POA: Diagnosis not present

## 2023-02-04 DIAGNOSIS — F5101 Primary insomnia: Secondary | ICD-10-CM | POA: Diagnosis not present

## 2023-02-04 DIAGNOSIS — E441 Mild protein-calorie malnutrition: Secondary | ICD-10-CM | POA: Diagnosis not present

## 2023-02-04 DIAGNOSIS — I2584 Coronary atherosclerosis due to calcified coronary lesion: Secondary | ICD-10-CM | POA: Diagnosis not present

## 2023-02-04 DIAGNOSIS — J309 Allergic rhinitis, unspecified: Secondary | ICD-10-CM | POA: Diagnosis not present

## 2023-02-04 DIAGNOSIS — J189 Pneumonia, unspecified organism: Secondary | ICD-10-CM | POA: Diagnosis present

## 2023-02-04 DIAGNOSIS — R059 Cough, unspecified: Secondary | ICD-10-CM | POA: Diagnosis not present

## 2023-02-04 DIAGNOSIS — J9611 Chronic respiratory failure with hypoxia: Secondary | ICD-10-CM | POA: Diagnosis not present

## 2023-02-04 DIAGNOSIS — I5022 Chronic systolic (congestive) heart failure: Secondary | ICD-10-CM | POA: Diagnosis not present

## 2023-02-04 DIAGNOSIS — G2581 Restless legs syndrome: Secondary | ICD-10-CM | POA: Diagnosis not present

## 2023-02-04 DIAGNOSIS — S72002D Fracture of unspecified part of neck of left femur, subsequent encounter for closed fracture with routine healing: Secondary | ICD-10-CM | POA: Diagnosis not present

## 2023-02-04 DIAGNOSIS — R279 Unspecified lack of coordination: Secondary | ICD-10-CM | POA: Diagnosis not present

## 2023-02-04 DIAGNOSIS — E876 Hypokalemia: Secondary | ICD-10-CM | POA: Diagnosis not present

## 2023-02-04 DIAGNOSIS — R531 Weakness: Secondary | ICD-10-CM | POA: Diagnosis not present

## 2023-02-04 DIAGNOSIS — N1831 Chronic kidney disease, stage 3a: Secondary | ICD-10-CM | POA: Diagnosis not present

## 2023-02-04 DIAGNOSIS — I440 Atrioventricular block, first degree: Secondary | ICD-10-CM | POA: Diagnosis not present

## 2023-02-04 DIAGNOSIS — I63532 Cerebral infarction due to unspecified occlusion or stenosis of left posterior cerebral artery: Secondary | ICD-10-CM | POA: Diagnosis not present

## 2023-02-04 DIAGNOSIS — E1122 Type 2 diabetes mellitus with diabetic chronic kidney disease: Secondary | ICD-10-CM | POA: Diagnosis not present

## 2023-02-04 DIAGNOSIS — J449 Chronic obstructive pulmonary disease, unspecified: Secondary | ICD-10-CM | POA: Diagnosis not present

## 2023-02-04 DIAGNOSIS — I10 Essential (primary) hypertension: Secondary | ICD-10-CM | POA: Diagnosis not present

## 2023-02-04 DIAGNOSIS — W19XXXD Unspecified fall, subsequent encounter: Secondary | ICD-10-CM | POA: Diagnosis not present

## 2023-02-04 DIAGNOSIS — N390 Urinary tract infection, site not specified: Secondary | ICD-10-CM | POA: Diagnosis not present

## 2023-02-04 DIAGNOSIS — I482 Chronic atrial fibrillation, unspecified: Secondary | ICD-10-CM | POA: Diagnosis not present

## 2023-02-04 DIAGNOSIS — I639 Cerebral infarction, unspecified: Secondary | ICD-10-CM | POA: Diagnosis not present

## 2023-02-04 DIAGNOSIS — Z66 Do not resuscitate: Secondary | ICD-10-CM | POA: Diagnosis not present

## 2023-02-04 DIAGNOSIS — I130 Hypertensive heart and chronic kidney disease with heart failure and stage 1 through stage 4 chronic kidney disease, or unspecified chronic kidney disease: Secondary | ICD-10-CM | POA: Diagnosis not present

## 2023-02-04 DIAGNOSIS — Z9981 Dependence on supplemental oxygen: Secondary | ICD-10-CM | POA: Diagnosis not present

## 2023-02-04 DIAGNOSIS — E8809 Other disorders of plasma-protein metabolism, not elsewhere classified: Secondary | ICD-10-CM | POA: Diagnosis not present

## 2023-02-04 DIAGNOSIS — R262 Difficulty in walking, not elsewhere classified: Secondary | ICD-10-CM | POA: Diagnosis not present

## 2023-02-04 DIAGNOSIS — R112 Nausea with vomiting, unspecified: Secondary | ICD-10-CM

## 2023-02-04 DIAGNOSIS — R0602 Shortness of breath: Secondary | ICD-10-CM | POA: Diagnosis not present

## 2023-02-04 HISTORY — DX: Nausea with vomiting, unspecified: R11.2

## 2023-02-04 LAB — CBC WITH DIFFERENTIAL/PLATELET
Abs Immature Granulocytes: 0.63 10*3/uL — ABNORMAL HIGH (ref 0.00–0.07)
Basophils Absolute: 0.1 10*3/uL (ref 0.0–0.1)
Basophils Relative: 1 %
Eosinophils Absolute: 0.2 10*3/uL (ref 0.0–0.5)
Eosinophils Relative: 2 %
HCT: 38.2 % — ABNORMAL LOW (ref 39.0–52.0)
Hemoglobin: 11.6 g/dL — ABNORMAL LOW (ref 13.0–17.0)
Immature Granulocytes: 5 %
Lymphocytes Relative: 11 %
Lymphs Abs: 1.4 10*3/uL (ref 0.7–4.0)
MCH: 30.6 pg (ref 26.0–34.0)
MCHC: 30.4 g/dL (ref 30.0–36.0)
MCV: 100.8 fL — ABNORMAL HIGH (ref 80.0–100.0)
Monocytes Absolute: 1.7 10*3/uL — ABNORMAL HIGH (ref 0.1–1.0)
Monocytes Relative: 14 %
Neutro Abs: 8.3 10*3/uL — ABNORMAL HIGH (ref 1.7–7.7)
Neutrophils Relative %: 67 %
Platelets: 249 10*3/uL (ref 150–400)
RBC: 3.79 MIL/uL — ABNORMAL LOW (ref 4.22–5.81)
RDW: 14.6 % (ref 11.5–15.5)
WBC: 12.3 10*3/uL — ABNORMAL HIGH (ref 4.0–10.5)
nRBC: 0 % (ref 0.0–0.2)

## 2023-02-04 LAB — COMPREHENSIVE METABOLIC PANEL
ALT: 30 U/L (ref 0–44)
AST: 22 U/L (ref 15–41)
Albumin: 2.3 g/dL — ABNORMAL LOW (ref 3.5–5.0)
Alkaline Phosphatase: 109 U/L (ref 38–126)
Anion gap: 4 — ABNORMAL LOW (ref 5–15)
BUN: 47 mg/dL — ABNORMAL HIGH (ref 8–23)
CO2: 29 mmol/L (ref 22–32)
Calcium: 9.2 mg/dL (ref 8.9–10.3)
Chloride: 105 mmol/L (ref 98–111)
Creatinine, Ser: 1.51 mg/dL — ABNORMAL HIGH (ref 0.61–1.24)
GFR, Estimated: 45 mL/min — ABNORMAL LOW (ref >60)
Glucose, Bld: 92 mg/dL (ref 70–99)
Potassium: 3.8 mmol/L (ref 3.5–5.1)
Sodium: 138 mmol/L (ref 135–145)
Total Bilirubin: 0.8 mg/dL (ref 0.3–1.2)
Total Protein: 5.8 g/dL — ABNORMAL LOW (ref 6.5–8.1)

## 2023-02-04 LAB — MAGNESIUM: Magnesium: 2.8 mg/dL — ABNORMAL HIGH (ref 1.7–2.4)

## 2023-02-04 LAB — PHOSPHORUS: Phosphorus: 3.3 mg/dL (ref 2.5–4.6)

## 2023-02-04 MED ORDER — TORSEMIDE 10 MG PO TABS: 10.0000 mg | ORAL_TABLET | ORAL | 0 refills | Status: DC

## 2023-02-04 NOTE — Progress Notes (Signed)
6803 Stopped by to administer nebulizer, patient eating breakfast.

## 2023-02-04 NOTE — Progress Notes (Signed)
Report given to nurse at [facility].

## 2023-02-04 NOTE — Consult Note (Signed)
   [facility] CM Inpatient Consult   02/04/2023  Gerald Hall 1934-12-14 [id number]     Location: [facility] RN Hospital Liaison screened remotely [facility]).   [facility] Customer service manager [facility]) Accountable Care Organization [ACO] Patient: [name])    Primary Care Provider: Babs Sciara, MD with [facility]    Patient screened for days readmission hospitalization with noted medium risk score for unplanned readmission risk with 1 IP in 6 months. [facility]/Population Health RN liaison will assess for potential [facility]) Care Management service needs for post hospital transition for care coordination. Pt will be discharged to [facility]. Will collaborate with PAC-RN to follow as [facility] ACO patient.  Plan: Population Health RN Liaison will continue to follow ongoing disposition in assessing for post hospital community care coordination/management needs.  Referral request for community care coordination: pending disposition.   [facility] Care Management/Population Health does not replace or interfere with any arrangements made by the Inpatient Transition of Care team.   For questions contact:       [name], RN, BSN [facility] Liaison [place]   [facility]  Population Health Office Hours MTWF 8:00 am to 6 pm [phone] mobile [phone] [Office toll free line][facility] Office Hours are M-F 8:30 - 5 pm 24 hour nurse advise line [phone] Conceirge  [name]

## 2023-02-04 NOTE — Hospital Course (Addendum)
Gerald Hall was admitted to the hospital with the working diagnosis of closed left femoral neck fracture.   88 yo male with the past medical history of atrial fibrillation, COPD and CKD who presented with left hip post mechanical fall. Patient fell while trying to pick up a tissue from the floor. He landed on his left hip, producing severe pain. On his initial physical examination his blood pressure was 178/92, HR 79, RR 18 and 02 saturation 94%, lungs with diffuse expiratory wheezing, heart with S1 and S2 present and regular with no gallops, abdomen with no distention and positive lower extremity edema +.   Na 137, K 3.9 Cl 102 bicarbonate 16 glucose 126, bun 29, cr 1,38  BNP 253  Wbc 13,2 hgb 16,0 plt 202  Urine analysis SG 1,015, negative protein, negative leukocytes.   Left hip radiograph with acute displaced left femoral neck fracture.   Chest radiograph with cardiomegaly with no effusions or infiltrates.   EKG 75 bpm, normal axis, right bundle branch block, qtc 536, sinus rhythm with 1st degree AV block, with no significant ST segment or T wave changes.   04/04 left hip hemiarthroplasty. Physical therapy and occupational therapy recommended SNF. Prolonged hospitalization due to diarrhea and delirium.   04/12 clinically patient has improved, plan to transfer to SNF today and follow up as outpatient.

## 2023-02-04 NOTE — Progress Notes (Signed)
Physical Therapy Treatment Patient Details Name: Gerald Hall MRN: [id number] DOB: [birth year] Today's Date: 02/04/2023   History of Present Illness Gerald Hall is a 88 y.o. male with medical history significant for BPH, atria fib, COPD with chronic respiratory failure, CKD3.  Patient was brought to the ED with reports of a fall and subsequent left hip pain.  At baseline patient ambulates with a walker.  Today he bent over to pick something from the floor (Tissue) and fell over onto his left hip.  He otherwise has no chest pain or difficulty breathing, no dizziness.  No memory problems.  He has a caregiver that lives with him.  No cough.  Swelling to his bilateral lower extremities that is at baseline today.  He is on chronic O2.    PT Comments    Patient agreeable for therapy and demonstrates slight improvement for moving LLE during bed mobility, fair/good return for completing BLE exercises while seated at bedside with fair/good sitting balance and able to take a few side steps and tolerated up to 4-5 standing partial squats before having to sit.  Patient tolerated sitting up in chair  with family members present after therapy.  Patient will benefit from continued skilled physical therapy in hospital and recommended venue below to increase strength, balance, endurance for safe ADLs and gait.     Recommendations for follow up therapy are one component of a multi-disciplinary discharge planning process, led by the attending physician.  Recommendations may be updated based on patient status, additional functional criteria and insurance authorization.  Follow Up Recommendations  Can patient physically be transported by private vehicle: No    Assistance Recommended at Discharge Set up Supervision/Assistance  Patient can return home with the following A lot of help with walking and/or transfers;A lot of help with bathing/dressing/bathroom;Assistance with cooking/housework;Assist for  transportation;Help with stairs or ramp for entrance   Equipment Recommendations  None recommended by PT    Recommendations for Other Services       Precautions / Restrictions Precautions Precautions: Fall Precaution Comments: adduction pillow in bed Restrictions Weight Bearing Restrictions: Yes LLE Weight Bearing: Weight bearing as tolerated     Mobility  Bed Mobility Overal bed mobility: Needs Assistance Bed Mobility: Supine to Sit     Supine to sit: Mod assist     General bed mobility comments: increased time, labored movement    Transfers Overall transfer level: Needs assistance Equipment used: Rolling walker (2 wheels) Transfers: Sit to/from Stand, Bed to chair/wheelchair/BSC Sit to Stand: Mod assist, Max assist   Step pivot transfers: Mod assist, Max assist       General transfer comment: unsteady movement with diffiuclty advancing LLE    Ambulation/Gait Ambulation/Gait assistance: Mod assist, Max assist Gait Distance (Feet): 4 Feet Assistive device: Rolling walker (2 wheels) Gait Pattern/deviations: Decreased step length - right, Decreased step length - left, Decreased stance time - left, Decreased stride length, Antalgic, Shuffle, Trunk flexed Gait velocity: slow     General Gait Details: able to complete a few side steps with frequent buckling of LLE mostly having to shuffle left foot   Stairs             Wheelchair Mobility    Modified Rankin (Stroke Patients Only)       Balance Overall balance assessment: Needs assistance Sitting-balance support: Feet supported, No upper extremity supported Sitting balance-Leahy Scale: Fair Sitting balance - Comments: fair/good seated at EOB   Standing balance support: Reliant on assistive device  for balance, During functional activity, Bilateral upper extremity supported Standing balance-Leahy Scale: Poor Standing balance comment: using RW                            Cognition  Arousal/Alertness: Awake/alert Behavior During Therapy: WFL for tasks assessed/performed Overall Cognitive Status: Within Functional Limits for tasks assessed                                          Exercises General Exercises - Lower Extremity Long Arc Quad: 10 reps, AROM, Seated, Strengthening, Both Hip Flexion/Marching: Seated, AROM, Strengthening, Both, 10 reps Toe Raises: AROM, Seated, Strengthening, Both, 10 reps Heel Raises: Seated, AROM, Strengthening, Both, 10 reps    General Comments        Pertinent Vitals/Pain Pain Assessment Pain Assessment: Faces Faces Pain Scale: Hurts a little bit Pain Descriptors / Indicators: Guarding, Sore Pain Intervention(s): Limited activity within patient's tolerance, Monitored during session, Repositioned    Home Living                          Prior Function            PT Goals (current goals can now be found in the care plan section) Acute Rehab PT Goals Patient Stated Goal: return home after rehab PT Goal Formulation: With patient/family Time For Goal Achievement: 02/11/23 Potential to Achieve Goals: Good Progress towards PT goals: Progressing toward goals    Frequency    Min 3X/week      PT Plan Current plan remains appropriate    Co-evaluation PT/OT/SLP Co-Evaluation/Treatment: Yes Reason for Co-Treatment: To address functional/ADL transfers PT goals addressed during session: Mobility/safety with mobility;Balance;Proper use of DME;Strengthening/ROM        AM-PAC PT "6 Clicks" Mobility   Outcome Measure  Help needed turning from your back to your side while in a flat bed without using bedrails?: A Lot Help needed moving from lying on your back to sitting on the side of a flat bed without using bedrails?: A Lot Help needed moving to and from a bed to a chair (including a wheelchair)?: A Lot Help needed standing up from a chair using your arms (e.g., wheelchair or bedside chair)?: A  Lot Help needed to walk in hospital room?: A Lot Help needed climbing 3-5 steps with a railing? : Total 6 Click Score: 11    End of Session Equipment Utilized During Treatment: Oxygen Activity Tolerance: Patient tolerated treatment well;Patient limited by fatigue Patient left: in chair;with call bell/phone within reach;with family/visitor present Nurse Communication: Mobility status PT Visit Diagnosis: Unsteadiness on feet (R26.81);Other abnormalities of gait and mobility (R26.89);Muscle weakness (generalized) (M62.81);Pain Pain - Right/Left: Left Pain - part of body: Hip     Time: 9211-9417 PT Time Calculation (min) (ACUTE ONLY): 26 min  Charges:  $Therapeutic Exercise: 8-22 mins $Therapeutic Activity: 8-22 mins                     12:42 PM, 02/04/23 Ocie Bob, MPT Physical Therapist with [facility] 336 [phone] office [phone] mobile phone

## 2023-02-04 NOTE — Care Management Important Message (Signed)
Important Message  Patient Details  Name: Gerald Hall MRN: [id number] Date of Birth: April 27, 1935   Medicare Important Message Given:  Yes     [name] 02/04/2023, 9:45 AM

## 2023-02-04 NOTE — Progress Notes (Signed)
Occupational Therapy Treatment Patient Details Name: Gerald Hall MRN: [id number] DOB: Sep 30, 1935 Today's Date: 02/04/2023   History of present illness Gerald Hall is a 88 y.o. male with medical history significant for BPH, atria fib, COPD with chronic respiratory failure, CKD3.  Patient was brought to the ED with reports of a fall and subsequent left hip pain.  At baseline patient ambulates with a walker.  Today he bent over to pick something from the floor (Tissue) and fell over onto his left hip.  He otherwise has no chest pain or difficulty breathing, no dizziness.  No memory problems.  He has a caregiver that lives with him.  No cough.  Swelling to his bilateral lower extremities that is at baseline today.  He is on chronic O2.   OT comments  Pt agreeable to OT and PT co-treatment. Pt demonstrated improved ability to engage with bed mobility and standing today. Much improved postural stability in sitting today without any R side or posterior lean once upright at EOB. Pt was able to transfer to the chair with mod to max A today and even stood again from the chair and maintained standing for 15 to 30 seconds before sitting. While pt stood this therapist assisted with peri-care due to pt having some bowel movements while transferring to the chair. Small cut/skin tear noted on pt's R arm after transfer. PT applied a bandage. Pt is showing improved endurance and strength today. Pt will benefit from continued OT in the hospital and recommended venue below to increase strength, balance, and endurance for safe ADL's.      Recommendations for follow up therapy are one component of a multi-disciplinary discharge planning process, led by the attending physician.  Recommendations may be updated based on patient status, additional functional criteria and insurance authorization.    Assistance Recommended at Discharge Frequent or constant Supervision/Assistance  Patient can return home with the  following  Two people to help with walking and/or transfers;A lot of help with bathing/dressing/bathroom;Assistance with cooking/housework;Assist for transportation;Help with stairs or ramp for entrance   Equipment Recommendations  None recommended by OT    Recommendations for Other Services      Precautions / Restrictions Precautions Precautions: Fall Restrictions Weight Bearing Restrictions: Yes LLE Weight Bearing: Weight bearing as tolerated       Mobility Bed Mobility Overal bed mobility: Needs Assistance Bed Mobility: Supine to Sit     Supine to sit: Mod assist     General bed mobility comments: increased time, labored movement ; assist to move L LE and come to sitting at EOB; able to scoot to EOB with less assist.    Transfers Overall transfer level: Needs assistance Equipment used: Rolling walker (2 wheels) Transfers: Sit to/from Stand, Bed to chair/wheelchair/BSC Sit to Stand: Mod assist, Max assist     Step pivot transfers: Mod assist, Max assist     General transfer comment: Labored movement; extended time; x2 reps of sit to stand, one from EOB and one from chair.     Balance Overall balance assessment: Needs assistance Sitting-balance support: Feet supported, No upper extremity supported Sitting balance-Leahy Scale: Fair Sitting balance - Comments: fair to good seated at EOB; no lateral lean today.   Standing balance support: Reliant on assistive device for balance, No upper extremity supported, During functional activity Standing balance-Leahy Scale: Poor Standing balance comment: using RW  ADL either performed or assessed with clinical judgement   ADL    Max A for peri care with pt standing from chair with assist.                                             Cognition Arousal/Alertness: Awake/alert Behavior During Therapy: WFL for tasks assessed/performed Overall Cognitive Status: Within  Functional Limits for tasks assessed                                                       General Comments  R forearm skin tear/cut noted near pt's bracelet after transfer. PT applied a bandage.     Pertinent Vitals/ Pain       Pain Assessment Pain Assessment: Faces Faces Pain Scale: Hurts a little bit Pain Location: L hip Pain Descriptors / Indicators: Guarding Pain Intervention(s): Limited activity within patient's tolerance, Monitored during session, Repositioned                                                          Frequency  Min 2X/week        Progress Toward Goals  OT Goals(current goals can now be found in the care plan section)  Progress towards OT goals: Progressing toward goals  Acute Rehab OT Goals Patient Stated Goal: Pt and family open to rehab to get stronger. OT Goal Formulation: With patient/family Time For Goal Achievement: 02/11/23 Potential to Achieve Goals: Good ADL Goals Pt Will Perform Grooming: with set-up;sitting Pt Will Perform Lower Body Bathing: with mod assist;with min assist;sitting/lateral leans;with adaptive equipment Pt Will Perform Upper Body Dressing: with set-up;sitting Pt Will Perform Lower Body Dressing: with mod assist;with min assist;with adaptive equipment;sitting/lateral leans Pt Will Transfer to Toilet: with min assist;stand pivot transfer;with min guard assist Pt Will Perform Toileting - Clothing Manipulation and hygiene: with min guard assist;sitting/lateral leans Pt/caregiver will Perform Home Exercise Program: Increased strength;Increased ROM;Both right and left upper extremity;Independently;With written HEP provided  Plan Discharge plan remains appropriate    Co-evaluation    PT/OT/SLP Co-Evaluation/Treatment: Yes Reason for Co-Treatment: To address functional/ADL transfers   OT goals addressed during session: ADL's and self-care                           End of Session Equipment Utilized During Treatment: Rolling walker (2 wheels);Oxygen  OT Visit Diagnosis: Unsteadiness on feet (R26.81);Other abnormalities of gait and mobility (R26.89);Muscle weakness (generalized) (M62.81)   Activity Tolerance Patient tolerated treatment well   Patient Left in chair;with call bell/phone within reach;with family/visitor present             Time: 8657-8469 OT Time Calculation (min): 17 min  Charges: OT General Charges $OT Visit: 1 Visit  Danie Chandler OT, MOT  Danie Chandler 02/04/2023, 8:47 AM

## 2023-02-04 NOTE — Progress Notes (Signed)
1610 PT  working with patient at this time. Nebulizer treatment to be given at a later time.

## 2023-02-04 NOTE — Assessment & Plan Note (Signed)
Calculated BMI 30,7

## 2023-02-04 NOTE — Assessment & Plan Note (Signed)
Patient had mild delirium during his hospitalization. He had supportive medical therapy with improvement of his symptoms.  At the time of his discharge he is back to his baseline.   Positive decreased hearting on the right ear.

## 2023-02-04 NOTE — Discharge Summary (Addendum)
Physician Discharge Summary   Patient: Gerald Hall MRN: [id number] DOB: 1935/02/24  Admit date:     01/26/2023  Discharge date: 02/04/23  Discharge Physician: York Ram Kyuss Hale   PCP: Babs Sciara, MD   Recommendations at discharge:    Entreto on hold due to hypotension and recovering renal function, to assess resumption as outpatient. Diuresis with torsemide 10 mg every other day. Follow up renal function and electrolytes in 7 days.  Follow up with Cardiology as scheduled. Follow up with Gerald Hall in 7 to 10 days.   Discharge Diagnoses: Principal Problem:   Closed displaced fracture of left femoral neck Active Problems:   Acute on chronic systolic CHF (congestive heart failure)   COPD (chronic obstructive pulmonary disease)   Chronic atrial fibrillation, unspecified   HTN (hypertension), benign   Hyperthyroidism   CKD stage 3a, GFR 45-59 ml/min   Acute metabolic encephalopathy   Intractable nausea and vomiting   Class 1 obesity  Resolved Problems:   Stage 3b chronic kidney disease  Hospital Course: Gerald Hall was admitted to the hospital with the working diagnosis of closed left femoral neck fracture.   88 yo male with the past medical history of atrial fibrillation, COPD and CKD who presented with left hip post mechanical fall. Patient fell while trying to pick up a tissue from the floor. He landed on his left hip, producing severe pain. On his initial physical examination his blood pressure was 178/92, HR 79, RR 18 and 02 saturation 94%, lungs with diffuse expiratory wheezing, heart with S1 and S2 present and regular with no gallops, abdomen with no distention and positive lower extremity edema +.   Na 137, K 3.9 Cl 102 bicarbonate 16 glucose 126, bun 29, cr 1,38  BNP 253  Wbc 13,2 hgb 16,0 plt 202  Urine analysis SG 1,015, negative protein, negative leukocytes.   Left hip radiograph with acute displaced left femoral neck fracture.   Chest radiograph with  cardiomegaly with no effusions or infiltrates.   EKG 75 bpm, normal axis, right bundle branch block, qtc 536, sinus rhythm with 1st degree AV block, with no significant ST segment or T wave changes.   04/04 left hip hemiarthroplasty. Physical therapy and occupational therapy recommended SNF. Prolonged hospitalization due to diarrhea and delirium.   04/12 clinically patient has improved, plan to transfer to SNF today and follow up as outpatient.   Assessment and Plan: * Closed displaced fracture of left femoral neck Patient was admitted to the medical ward. He had a pre op cardiology evaluation.  Underwent left hip hemiarthroplasty with no major complications. Pain control with oral and IV analgesics.  PT and OT have recommended to continue therapy at SNF.   Acute on chronic systolic CHF (congestive heart failure) Patient received IV furosemide peri operative setting, and then transitioned to torsemide.  Intermittent hypotension.    Patient will continue medical therapy with carvedilol and SGLT 2 inh. Resume torsemide every 48 hrs.  Plan to resume entresto or arb as outpatient when renal function and blood pressure more stable.   Prolonged qtc, note patient has a right bundle branch block, manually calculated qrc approximate 480 ms.  PSVT, plan to continue amiodarone.   COPD (chronic obstructive pulmonary disease) Chronic hypoxemic respiratory failure.  No clinical signs of exacerbation.  Continue with bronchodilator therapy.   Chronic atrial fibrillation, unspecified Continue with amiodarone and carvedilol.  Currently not on anticoagulation, follow up as outpatient with Cardiology.   HTN (hypertension), benign  Patient with intermittent hypotension, plan to continue holding entresto until follow up as outpatient.   Systolic blood pressure 122 to 102 mmHg.  Hyperthyroidism Resume methimazole  CKD stage 3a, GFR 45-59 ml/min AKI on CKD   Renal function at the time of  discharge with serum cr at 1,51 with K at 3,8 and serum bicarbonate at 29. Na is 138 and Mg 2,8  Follow up renal function and electrolytes as outpatient.   Acute metabolic encephalopathy Patient had mild delirium during his hospitalization. He had supportive medical therapy with improvement of his symptoms.  At the time of his discharge he is back to his baseline.   Positive decreased hearting on the right ear.  Intractable nausea and vomiting Transitory nausea and vomiting, now improved.  Initially constipated and after bowel regimen he has developed diarrhea that seems to be improving.   Plan for close follow up as outpatient.   Class 1 obesity Calculated BMI 30,7          Consultants: orthopedics and cardiology  Procedures performed: left hip hemiarthroplasty   Disposition: Skilled nursing facility Diet recommendation:  Discharge Diet Orders (From admission, onward)     Start     Ordered   02/04/23 0000  Diet - low sodium heart healthy        02/04/23 1105   01/31/23 0000  Diet - low sodium heart healthy        01/31/23 1026           Cardiac diet DISCHARGE MEDICATION: Allergies as of 02/04/2023       Reactions   Cephalexin Diarrhea   And general weakness   Penicillins Other (See Comments)   GI UPSET        Medication List     STOP taking these medications    Entresto 24-26 MG Generic drug: sacubitril-valsartan   potassium chloride 10 MEQ tablet Commonly known as: KLOR-CON M       TAKE these medications    acetaminophen 500 MG tablet Commonly known as: TYLENOL Take 1,000 mg by mouth every 6 (six) hours as needed for mild pain or headache.   albuterol 108 (90 Base) MCG/ACT inhaler Commonly known as: VENTOLIN HFA Inhale 2 puffs into the lungs every 4 (four) hours as needed for wheezing.   amiodarone 200 MG tablet Commonly known as: PACERONE Take 1 tablet (200 mg total) by mouth daily.   ascorbic acid 500 MG tablet Commonly known  as: VITAMIN C Take 500 mg by mouth daily.   aspirin EC 81 MG tablet Take 1 tablet (81 mg total) by mouth 2 (two) times daily. Swallow whole. What changed: when to take this   budesonide-formoterol 160-4.5 MCG/ACT inhaler Commonly known as: Symbicort Inhale 2 puffs into the lungs 2 (two) times daily.   calcium carbonate 600 MG Tabs tablet Commonly known as: OS-CAL Take 600 mg by mouth daily with breakfast.   carvedilol 3.125 MG tablet Commonly known as: COREG Take 1 tablet (3.125 mg total) by mouth 2 (two) times daily with a meal.   cholecalciferol 10 MCG (400 UNIT) Tabs tablet Commonly known as: VITAMIN D3 Take 1,000 Units by mouth daily.   clonazePAM 0.5 MG tablet Commonly known as: KLONOPIN Take 1 tablet (0.5 mg total) by mouth at bedtime as needed (insomnia).   finasteride 5 MG tablet Commonly known as: PROSCAR TAKE ONE TABLET BY MOUTH EVERY DAY   fluticasone 50 MCG/ACT nasal spray Commonly known as: FLONASE Place 2 sprays into both nostrils  at bedtime.   ipratropium-albuterol 0.5-2.5 (3) MG/3ML Soln Commonly known as: DUONEB Take 3 mLs by nebulization every 4 (four) hours as needed. What changed: reasons to take this   Jardiance 10 MG Tabs tablet Generic drug: empagliflozin TAKE ONE TABLET BY MOUTH ONCE DAILY BEFORE BREAKFAST What changed: See the new instructions.   methimazole 5 MG tablet Commonly known as: TAPAZOLE TAKE ONE TABLET BY MOUTH EVERY DAY   mirabegron ER 50 MG Tb24 tablet Commonly known as: MYRBETRIQ Take 50 mg by mouth daily.   Mucinex DM 30-600 MG Tb12 Take 1 tablet by mouth daily.   oxyCODONE 5 MG immediate release tablet Commonly known as: Roxicodone Take 1 tablet (5 mg total) by mouth every 8 (eight) hours as needed for severe pain or breakthrough pain.   OXYGEN Inhale 2 L into the lungs continuous.   oxymetazoline 0.05 % nasal spray Commonly known as: AFRIN Place 1 spray into both nostrils at bedtime.   polyethylene glycol 17  g packet Commonly known as: MIRALAX / GLYCOLAX Take 17 g by mouth daily as needed for mild constipation.   polyvinyl alcohol 1.4 % ophthalmic solution Commonly known as: LIQUIFILM TEARS Place 1 drop into both eyes as needed for dry eyes.   pregabalin 25 MG capsule Commonly known as: Lyrica Take 1 capsule (25 mg total) by mouth 2 (two) times daily.   rOPINIRole 2 MG tablet Commonly known as: REQUIP Take 1 tablet (2 mg total) by mouth 2 (two) times daily. What changed:  how much to take how to take this when to take this additional instructions   tamsulosin 0.4 MG Caps capsule Commonly known as: FLOMAX TAKE (1) CAPSULE BY MOUTH TWICE DAILY. What changed: See the new instructions.   torsemide 10 MG tablet Commonly known as: DEMADEX Take 1 tablet (10 mg total) by mouth every other day. Start taking on: February 06, 2023 What changed: medication strength   traMADol 50 MG tablet Commonly known as: ULTRAM Take 1 tablet (50 mg total) by mouth 3 (three) times daily as needed for moderate pain or severe pain. What changed:  how much to take how to take this when to take this reasons to take this additional instructions   ZINC ACETATE PO Take 1 tablet by mouth daily.        Contact information for follow-up providers     Luking, Jonna Coup, MD. Go in 4 week(s).   Specialty: Family Medicine Contact information: [address] [place] [address] [phone]         Oliver Barre, MD. Go in 2 week(s).   Specialties: Orthopedic Surgery, Sports Medicine Contact information: [address] [place] [address] [phone]         Laurann Montana, PA-C Follow up on 02/14/2023.   Specialties: Cardiology, Radiology Why: Cardiology Hospital Follow-up on 02/14/2023 at 3:30 PM. Contact information: [address] [place] [address] [phone]              Contact information for after-discharge care     Destination     [facility] Preferred SNF .   Service: Skilled Nursing Contact information: [address] [place] [address] [phone]                    Discharge Exam: Ceasar Mons Weights   01/26/23 1709 01/26/23 2134 01/27/23 1320  Weight: 90.7 kg 91.8 kg 91.8  kg   BP (!) 102/51 (BP Location: Left Arm)   Pulse 62   Temp 97.7 F (36.5 C)   Resp 20   Ht  (1.727 m)   Wt 91.8 kg   SpO2 97%   BMI 30.77 kg/m   Patient with no chest pain or dyspnea, left hip pain is controlled with oral analgesics, no further confusion and diarrhea seems to be improving. No nausea or vomiting  Neurology awake and alert ENT with mild pallor Cardiovascular with S1 and S2 present and rhythmic with no gallops, rubs or murmurs Respiratory with no rales or wheezing Abdomen with no distention  No lower extremity edema   Condition at discharge: stable  The results of significant diagnostics from this hospitalization (including imaging, microbiology, ancillary and laboratory) are listed below for reference.   Imaging Studies: DG Abd 1 View  Result Date: 02/02/2023 CLINICAL DATA:  History of constipation EXAM: ABDOMEN - 1 VIEW COMPARISON:  None Available. FINDINGS: Scattered large and small bowel gas is noted. No obstructive changes are seen. Bilateral hip replacements are noted. Degenerative changes of the thoracolumbar spine are seen. No abnormal mass is noted. IMPRESSION: No obstructive changes are noted. Electronically Signed   By: Alcide Clever M.D.   On: 02/02/2023 18:55   DG CHEST PORT 1 VIEW  Result Date: 02/01/2023 CLINICAL DATA:  Aspiration into airway EXAM: PORTABLE CHEST 1 VIEW COMPARISON:  01/26/2023 FINDINGS: Transverse diameter of heart is increased. There is poor inspiration. Lung fields are clear of any infiltrates or pulmonary edema. Small linear densities are seen in left lower lung field. There is no pleural effusion  or pneumothorax. Degenerative changes are noted in right shoulder. IMPRESSION: Cardiomegaly. Small linear densities in left lower lung field suggest subsegmental atelectasis. There are no signs of pulmonary edema or focal pulmonary consolidation. Electronically Signed   By: Ernie Avena M.D.   On: 02/01/2023 12:10   DG HIP UNILAT WITH PELVIS 2-3 VIEWS LEFT  Result Date: 01/27/2023 CLINICAL DATA:  Postop left hip arthroplasty EXAM: DG HIP (WITH OR WITHOUT PELVIS) 2-3V LEFT COMPARISON:  Yesterday FINDINGS: Interval left hip arthroplasty. Remote right hip arthroplasty. No new fracture. IMPRESSION: Expected appearance after left hip arthroplasty. Electronically Signed   By: Jeronimo Greaves M.D.   On: 01/27/2023 17:30   DG CHEST PORT 1 VIEW  Result Date: 01/26/2023 CLINICAL DATA:  [id number], preoperative chest x-ray for left femoral neck fracture fixation. EXAM: PORTABLE CHEST 1 VIEW COMPARISON:  Chest CT no contrast 11/04/2022 FINDINGS: The lungs are clear of infiltrates. The sulci are sharp. Stable mediastinum with mild aortic calcification and tortuosity. There is mild cardiomegaly without CHF. C5-6 ACDF plating is again shown. Moderate thoracic spondylosis. IMPRESSION: 1. No acute cardiopulmonary findings. 2. Mild cardiomegaly. 3. Aortic atherosclerosis. Electronically Signed   By: Almira Bar M.D.   On: 01/26/2023 22:12   DG Hip Unilat W or Wo Pelvis 2-3 Views Left  Result Date: 01/26/2023 CLINICAL DATA:  Fall with left hip pain EXAM: DG HIP (WITH OR WITHOUT PELVIS) 2-3V LEFT COMPARISON:  10/14/2015 FINDINGS: Right hip replacement. Pubic symphysis and rami appear intact. Acute left femoral neck fracture with superior displacement of the trochanter. No femoral head dislocation IMPRESSION: Acute displaced left femoral neck fracture. Electronically Signed   By: Jasmine Pang M.D.   On: 01/26/2023 17:43    Microbiology: Results for orders placed or performed during the hospital encounter of 01/26/23   Surgical pcr screen     Status: None  Collection Time: 01/26/23  9:50 PM   Specimen: Nasal Mucosa; Nasal Swab  Result Value Ref Range Status   MRSA, PCR NEGATIVE NEGATIVE Final   Staphylococcus aureus NEGATIVE NEGATIVE Final    Comment: (NOTE) The Xpert SA Assay (FDA approved for NASAL specimens in patients 81 years of age and older), is one component of a comprehensive surveillance program. It is not intended to diagnose infection nor to guide or monitor treatment. Performed at [facility], [address] [place], [place] [address]     Labs: CBC: Recent Labs  Lab 01/31/23 [phone] 02/01/23 0913 02/02/23 0439 02/03/23 0451 02/04/23 0443  WBC 10.7* 17.4* 19.5* 16.9* 12.3*  NEUTROABS  --   --   --  12.2* 8.3*  HGB 12.8* 13.5 12.1* 12.1* 11.6*  HCT 40.5 43.1 39.4 40.3 38.2*  MCV 99.8 100.2* 101.0* 103.3* 100.8*  PLT 209 240 236 237 249   Basic Metabolic Panel: Recent Labs  Lab 01/31/23 0439 02/01/23 0913 02/02/23 0439 02/03/23 0451 02/04/23 0443  NA 137 137 138 137 138  K 4.0 4.5 4.0 3.6 3.8  CL 101 101 103 105 105  CO2 28 28 28 28 29   GLUCOSE 110* 174* 144* 98 92  BUN 44* 53* 64* 55* 47*  CREATININE 1.59* 1.79* 1.96* 1.69* 1.51*  CALCIUM 9.0 9.4 9.2 9.2 9.2  MG 2.7* 3.0* 3.1* 3.0* 2.8*  PHOS  --   --   --  2.9 3.3   Liver Function Tests: Recent Labs  Lab 02/03/23 0451 02/04/23 0443  AST 29 22  ALT 34 30  ALKPHOS 118 109  BILITOT 1.0 0.8  PROT 6.1* 5.8*  ALBUMIN 2.5* 2.3*   CBG: No results for input(s): "GLUCAP" in the last 168 hours.  Discharge time spent: greater than 30 minutes.  Signed: Coralie Keens, MD [facility] 02/04/2023

## 2023-02-04 NOTE — Assessment & Plan Note (Addendum)
Patient received IV furosemide peri operative setting, and then transitioned to torsemide.  Intermittent hypotension.    Patient will continue medical therapy with carvedilol and SGLT 2 inh. Resume torsemide every 48 hrs.  Plan to resume entresto or arb as outpatient when renal function and blood pressure more stable.   Prolonged qtc, note patient has a right bundle branch block, manually calculated qrc approximate 480 ms.  PSVT, plan to continue amiodarone.

## 2023-02-04 NOTE — TOC Transition Note (Signed)
Transition of Care [facility]) - CM/SW Discharge Note   Patient Details  Name: Gerald Hall MRN: [id number] Date of Birth: [birth year]  Transition of Care [facility]) CM/SW Contact:  [name], LCSWA Phone Number: 02/04/2023, 12:00 PM   Clinical Narrative:    CSW confirmed pt is medically ready for D/C to [facility] today. CSW confirmed [facility] is approved. CSW spoke to admissions at facility, they are ready to accept pt. RN provided room and report numbers. CSW updated pts daughter Maralyn Sago of plan for D/C today. Med necessity has been completed. EMS called for transport. TOC signing off.   Final next level of care: Skilled Nursing Facility Barriers to Discharge: Barriers Resolved   Patient Goals and CMS Choice CMS Medicare.gov Compare Post Acute Care list provided to:: Patient Represenative (must comment) Choice offered to / list presented to : Patient, Adult Children  Discharge Placement     Existing PASRR number confirmed : 01/31/23          Patient chooses bed at: [facility] Patient to be transferred to facility by: EMS Name of family member notified: Maralyn Sago Daughter Patient and family notified of of transfer: 02/04/23  Discharge Plan and Services Additional resources added to the After Visit Summary for   In-house Referral: Clinical Social Work                                   Social Determinants of Health (SDOH) Interventions SDOH Screenings   Food Insecurity: No Food Insecurity (01/26/2023)  Housing: Low Risk  (01/26/2023)  Transportation Needs: No Transportation Needs (01/26/2023)  Utilities: Not At Risk (01/26/2023)  Alcohol Screen: Low Risk  (03/16/2022)  Depression (PHQ2-9): Low Risk  (01/12/2023)  Financial Resource Strain: Low Risk  (03/16/2022)  Physical Activity: Sufficiently Active (03/16/2022)  Social Connections: Moderately Integrated (03/16/2022)  Stress: No Stress Concern Present (03/16/2022)  Tobacco Use: High Risk (02/04/2023)      Readmission Risk Interventions     No data to display

## 2023-02-04 NOTE — Assessment & Plan Note (Signed)
Transitory nausea and vomiting, now improved.  Initially constipated and after bowel regimen he has developed diarrhea that seems to be improving.   Plan for close follow up as outpatient.

## 2023-02-07 DIAGNOSIS — G8928 Other chronic postprocedural pain: Secondary | ICD-10-CM | POA: Diagnosis not present

## 2023-02-07 DIAGNOSIS — N1831 Chronic kidney disease, stage 3a: Secondary | ICD-10-CM | POA: Diagnosis not present

## 2023-02-07 DIAGNOSIS — H9191 Unspecified hearing loss, right ear: Secondary | ICD-10-CM | POA: Diagnosis not present

## 2023-02-07 DIAGNOSIS — J449 Chronic obstructive pulmonary disease, unspecified: Secondary | ICD-10-CM | POA: Diagnosis not present

## 2023-02-07 DIAGNOSIS — E119 Type 2 diabetes mellitus without complications: Secondary | ICD-10-CM | POA: Diagnosis not present

## 2023-02-07 DIAGNOSIS — S72002A Fracture of unspecified part of neck of left femur, initial encounter for closed fracture: Secondary | ICD-10-CM | POA: Diagnosis not present

## 2023-02-07 DIAGNOSIS — J301 Allergic rhinitis due to pollen: Secondary | ICD-10-CM | POA: Diagnosis not present

## 2023-02-07 DIAGNOSIS — I480 Paroxysmal atrial fibrillation: Secondary | ICD-10-CM | POA: Diagnosis not present

## 2023-02-07 DIAGNOSIS — I10 Essential (primary) hypertension: Secondary | ICD-10-CM | POA: Diagnosis not present

## 2023-02-07 DIAGNOSIS — I5023 Acute on chronic systolic (congestive) heart failure: Secondary | ICD-10-CM | POA: Diagnosis not present

## 2023-02-07 DIAGNOSIS — F5101 Primary insomnia: Secondary | ICD-10-CM | POA: Diagnosis not present

## 2023-02-08 ENCOUNTER — Telehealth: Payer: Self-pay | Admitting: Cardiology

## 2023-02-08 NOTE — Telephone Encounter (Signed)
Spoke to [employer], PA-C who stated that patient should be seen in office for HF management, fluid overload assessment, and EKG.   Spoke to pt's daughter who verbalized understanding and stated she will have pt in the office. Pt stated that pt is also suffering from new onset dementia and parkinson's disease.

## 2023-02-08 NOTE — Telephone Encounter (Signed)
Pts daughter, Harriett Sine, states the pt fell and broke his hip 2 wks ago. Was d/c'd from [facility] 4/12. He's now in rehab. Harriett Sine states that if the appt on 4/22 with Ronie Spies PA is a standard appt, then they will need to r/s. Please advise.

## 2023-02-09 ENCOUNTER — Telehealth: Payer: Self-pay | Admitting: Orthopedic Surgery

## 2023-02-09 DIAGNOSIS — S72002A Fracture of unspecified part of neck of left femur, initial encounter for closed fracture: Secondary | ICD-10-CM | POA: Diagnosis not present

## 2023-02-09 DIAGNOSIS — G20B2 Parkinson's disease with dyskinesia, with fluctuations: Secondary | ICD-10-CM | POA: Diagnosis not present

## 2023-02-09 DIAGNOSIS — K5901 Slow transit constipation: Secondary | ICD-10-CM | POA: Diagnosis not present

## 2023-02-09 NOTE — Telephone Encounter (Signed)
Dr. Dallas Schimke pt - pt's daughter Karrie Meres [phone] lvm stating that the patient has an appointment for 02/11/23.  She stated the patient is in a rehab facility in [place] and that they can removed the staples there if that's all he needs done on Friday.  The op notes states staple removal and xray.  Please advise.

## 2023-02-10 NOTE — Telephone Encounter (Signed)
The patient's daughter lvm to follow up on her call from yesterday.  She was slightly irritated because she had not gotten a response.  I advised, cancelled the appointment for tomorrow and made him an appointment for 03/11/23.  Per Dr. Dallas Schimke response, the facility can removed the staples/sutures and he can follow up six weeks from surgery.

## 2023-02-11 ENCOUNTER — Encounter: Payer: Medicare Other | Admitting: Orthopedic Surgery

## 2023-02-11 DIAGNOSIS — S72002A Fracture of unspecified part of neck of left femur, initial encounter for closed fracture: Secondary | ICD-10-CM | POA: Diagnosis not present

## 2023-02-11 DIAGNOSIS — I10 Essential (primary) hypertension: Secondary | ICD-10-CM | POA: Diagnosis not present

## 2023-02-11 DIAGNOSIS — E119 Type 2 diabetes mellitus without complications: Secondary | ICD-10-CM | POA: Diagnosis not present

## 2023-02-11 DIAGNOSIS — K5901 Slow transit constipation: Secondary | ICD-10-CM | POA: Diagnosis not present

## 2023-02-11 DIAGNOSIS — J449 Chronic obstructive pulmonary disease, unspecified: Secondary | ICD-10-CM | POA: Diagnosis not present

## 2023-02-11 DIAGNOSIS — F5101 Primary insomnia: Secondary | ICD-10-CM | POA: Diagnosis not present

## 2023-02-11 DIAGNOSIS — G20B2 Parkinson's disease with dyskinesia, with fluctuations: Secondary | ICD-10-CM | POA: Diagnosis not present

## 2023-02-13 ENCOUNTER — Encounter: Payer: Self-pay | Admitting: Physician Assistant

## 2023-02-13 NOTE — Progress Notes (Unsigned)
Cardiology Office Note    Date:  02/14/2023   ID:  Gerald Hall, Gerald Hall 15-Mar-1935, MRN [id number]  PCP:  Babs Sciara, MD  Cardiologist:  Nona Dell, MD  Electrophysiologist:  None   Chief Complaint: f/u CHF  History of Present Illness:   Gerald Hall is a 88 y.o. male with history of chronic HFrEF (EF 45-50% in 02/2019, at 40-45% in 12/2021 and 30-35% in 11/2022), transient Afib 2020, PSVT, COPD (with chronic respiratory failure on home O2), HTN, HLD, hyperthyroidism, temporal arteritis, coronary atherosclerosis by prior non-cardiac CT, CKD stage 3 borderline a-b seen for post-hospital follow-up.   He has known hx of CHF as above. It does not appear that ischemic evaluation has been pursued given his age and no reported anginal symptoms. Prior CTA chest in 05/2022 was read as showing only mild coronary artery calcification along the LM and LCx. He also had a transient episode of afib in 2020 in setting of PNA and patient was hesitant to consider anticoagulation in light of slowly healing leg wounds. It was recommended to follow for recurrence. He subsequent has been known to have PSVT, managed with amiodarone. Last echo 11/2022 EF 30-35%, G1DD, mildly enlarged RV, moerate BAE, mild MR. He saw Dr. Diona Browner in the office 12/2022 with elevated HR at home felt c/w h/o PSVT, prompting adjustments to his amiodarone and torsemide. He was then admitted 4/3-4/12/24 for mechanical fall and femoral neck fracture. He underwent repair 01/27/23. Cardiology followed for his CHF as well as prolonged QT interval for which amiodarone was temporarily held then restarted at lower dose 100mg  daily. Discharge AVS says 200mg  daily. Sherryll Burger was held due to hypotension with recommendation tor revisit as outpatient. He was discharged to a facility in [place].  He is accompanied by his son in law today. He has not been doing well. Prior to recent admission he was fairly independent at home and now requires max  assist to do nearly anything. He is falling asleep frequently during the interview today and has had some intermittent right upper arm jerking. Family has wondered if he has developed dementia or Parkinson's (he lost his wife to Lewy body), or even possibly had a stroke post-surgery due to the dramatic decline between pre/post hospitalization. He had a hard time participating in memory exercises this afternoon per son in law. He continues to have DOE with minimal activity. No chest pain or significant edema. Unfortunately we do not have an updated medication list from his facility. CBG is 128.   Labwork independently reviewed: 01/2023 Mg 2.8, K 3.8, Cr 1.51, albumin 2.3, AST ALT OK, Hgb 11.6 macro plt OK 12/2022 trig 181, LDL 111 followed by PCP 11/2022 TFTs wnl  Past History   Past Medical History:  Diagnosis Date   Aortic atherosclerosis 06/23/2022   Seen on CAT scan   Arthritis    Balance problem    Uses a walker   Blood in urine    Sees urology yearly   Chronic HFrEF (heart failure with reduced ejection fraction)    Chronic kidney disease, stage 3    Coronary artery calcification seen on CT scan    Deafness in right ear age 9   Hyperlipidemia    Hypertension    Intractable nausea and vomiting 02/04/2023   Prolonged QT interval    PSVT (paroxysmal supraventricular tachycardia)    Temporal arteritis 2016   Urinary incontinence     Past Surgical History:  Procedure Laterality Date  BACK SURGERY  2010   BELPHAROPTOSIS REPAIR     CATARACT EXTRACTION Bilateral 07/30/14, 08/13/2014   CERVICAL SPINE SURGERY  09/01/15   C 4-5 , [facility] [place]   COLONOSCOPY  12/19/2007   RMR: 1. External hemorrohids, otherwise normal rectum.2. Normal colon. 3. Normal terminal ileum.   COLONOSCOPY N/A 12/13/2014   Procedure: COLONOSCOPY;  Surgeon: Corbin Ade, MD;  Location: [facility] ENDO SUITE;  Service: Endoscopy;  Laterality: N/A;  11:15 Pt Request Time   HIP ARTHROPLASTY Left 01/27/2023    Procedure: ARTHROPLASTY BIPOLAR HIP (HEMIARTHROPLASTY);  Surgeon: Oliver Barre, MD;  Location: [facility] ORS;  Service: Orthopedics;  Laterality: Left;   KNEE SURGERY Left    around 2000, arthroscopic   TOTAL HIP ARTHROPLASTY Right 02/23/2017   Procedure: TOTAL HIP ARTHROPLASTY ANTERIOR APPROACH;  Surgeon: Kennedy Bucker, MD;  Location: [facility] ORS;  Service: Orthopedics;  Laterality: Right;    Current Medications: Current Meds  Medication Sig   acetaminophen (TYLENOL) 500 MG tablet Take 1,000 mg by mouth every 6 (six) hours as needed for mild pain or headache.   albuterol (VENTOLIN HFA) 108 (90 Base) MCG/ACT inhaler Inhale 2 puffs into the lungs every 4 (four) hours as needed for wheezing.   amiodarone (PACERONE) 200 MG tablet Take 1 tablet (200 mg total) by mouth daily.   aspirin EC 81 MG tablet Take 1 tablet (81 mg total) by mouth 2 (two) times daily. Swallow whole.   budesonide-formoterol (SYMBICORT) 160-4.5 MCG/ACT inhaler Inhale 2 puffs into the lungs 2 (two) times daily.   calcium carbonate (OS-CAL) 600 MG TABS tablet Take 600 mg by mouth daily with breakfast.   carvedilol (COREG) 3.125 MG tablet Take 1 tablet (3.125 mg total) by mouth 2 (two) times daily with a meal.   cholecalciferol (VITAMIN D) 400 units TABS tablet Take 1,000 Units by mouth daily.   clonazePAM (KLONOPIN) 0.5 MG tablet Take 1 tablet (0.5 mg total) by mouth at bedtime as needed (insomnia).   Dextromethorphan-guaiFENesin (MUCINEX DM) 30-600 MG TB12 Take 1 tablet by mouth daily.    finasteride (PROSCAR) 5 MG tablet TAKE ONE TABLET BY MOUTH EVERY DAY   fluticasone (FLONASE) 50 MCG/ACT nasal spray Place 2 sprays into both nostrils at bedtime.   ipratropium-albuterol (DUONEB) 0.5-2.5 (3) MG/3ML SOLN Take 3 mLs by nebulization every 4 (four) hours as needed. (Patient taking differently: Take 3 mLs by nebulization every 4 (four) hours as needed (shortness of breath).)   JARDIANCE 10 MG TABS tablet TAKE ONE TABLET BY MOUTH ONCE DAILY  BEFORE BREAKFAST (Patient taking differently: Take 10 mg by mouth daily.)   methimazole (TAPAZOLE) 5 MG tablet TAKE ONE TABLET BY MOUTH EVERY DAY   mirabegron ER (MYRBETRIQ) 50 MG TB24 tablet Take 50 mg by mouth daily.   oxyCODONE (ROXICODONE) 5 MG immediate release tablet Take 1 tablet (5 mg total) by mouth every 8 (eight) hours as needed for severe pain or breakthrough pain.   OXYGEN Inhale 2 L into the lungs continuous.   oxymetazoline (AFRIN) 0.05 % nasal spray Place 1 spray into both nostrils at bedtime.   polyethylene glycol (MIRALAX / GLYCOLAX) 17 g packet Take 17 g by mouth daily as needed for mild constipation.   polyvinyl alcohol (LIQUIFILM TEARS) 1.4 % ophthalmic solution Place 1 drop into both eyes as needed for dry eyes.   pregabalin (LYRICA) 25 MG capsule Take 1 capsule (25 mg total) by mouth 2 (two) times daily.   rOPINIRole (REQUIP) 2 MG tablet Take  1 tablet (2 mg total) by mouth 2 (two) times daily.   tamsulosin (FLOMAX) 0.4 MG CAPS capsule TAKE (1) CAPSULE BY MOUTH TWICE DAILY. (Patient taking differently: Take 0.4 mg by mouth 2 (two) times daily.)   torsemide (DEMADEX) 10 MG tablet Take 1 tablet (10 mg total) by mouth every other day.   traMADol (ULTRAM) 50 MG tablet Take 1 tablet (50 mg total) by mouth 3 (three) times daily as needed for moderate pain or severe pain.   vitamin C (ASCORBIC ACID) 500 MG tablet Take 500 mg by mouth daily.   Zinc Acetate, Oral, (ZINC ACETATE PO) Take 1 tablet by mouth daily.      Allergies:   Cephalexin and Penicillins   Social History   Socioeconomic History   Marital status: Widowed    Spouse name: Britta Mccreedy   Number of children: 2   Years of education: 16   Highest education level: Not on file  Occupational History    Comment: retired Teacher, early years/pre  Tobacco Use   Smoking status: Every Day    Packs/day: .5    Types: Cigarettes   Smokeless tobacco: Never   Tobacco comments:    08/18/15 1/2 - 3/4 pack cigarettes daily  Vaping Use    Vaping Use: Never used  Substance and Sexual Activity   Alcohol use: Yes    Alcohol/week: 0.0 standard drinks of alcohol    Comment: mixed drink 1-2 times per month, social   Drug use: No   Sexual activity: Not on file  Other Topics Concern   Not on file  Social History Narrative   Lives at home. Has home health aids to assist with housekeeping/appointments.   Caffeine use- coffee 8-10 cups daily   2 daughters, one lives locally, other lives in [place].   2 granddaughters.   1 great granddaughter-age 57.   Social Determinants of Health   Financial Resource Strain: Low Risk  (03/16/2022)   Overall Financial Resource Strain (CARDIA)    Difficulty of Paying Living Expenses: Not hard at all  Food Insecurity: No Food Insecurity (01/26/2023)   Hunger Vital Sign    Worried About Running Out of Food in the Last Year: Never true    Ran Out of Food in the Last Year: Never true  Transportation Needs: No Transportation Needs (01/26/2023)   PRAPARE - Administrator, Civil Service (Medical): No    Lack of Transportation (Non-Medical): No  Physical Activity: Sufficiently Active (03/16/2022)   Exercise Vital Sign    Days of Exercise per Week: 3 days    Minutes of Exercise per Session: 60 min  Stress: No Stress Concern Present (03/16/2022)   [employer] of Occupational Health - Occupational Stress Questionnaire    Feeling of Stress : Only a little  Social Connections: Moderately Integrated (03/16/2022)   Social Connection and Isolation Panel [NHANES]    Frequency of Communication with Friends and Family: More than three times a week    Frequency of Social Gatherings with Friends and Family: More than three times a week    Attends Religious Services: More than 4 times per year    Active Member of [employer] or Organizations: Yes    Attends Banker Meetings: More than 4 times per year    Marital Status: Widowed     Family History:  The patient's family history includes Colon  cancer in his brother; Diabetes in his brother and brother.  ROS:   Please see the history of present  illness.  All other systems are reviewed and otherwise negative.    EKG(s)/Additional Testing   EKG:  EKG is ordered today, personally reviewed, demonstrating  EKG today shows NSR 69bpm with NSIVCD/RBBB pattern with long first degree AVB, QTc hand calculated between 472-558ms. Reviewed with Dr. Eden Emms.  CV Studies: Cardiac studies reviewed are outlined and summarized above. Otherwise please see EMR for full report.  Recent Labs: 12/09/2022: TSH 0.994 01/26/2023: B Natriuretic Peptide 253.0 02/04/2023: ALT 30; BUN 47; Creatinine, Ser 1.51; Hemoglobin 11.6; Magnesium 2.8; Platelets 249; Potassium 3.8; Sodium 138  Recent Lipid Panel    Component Value Date/Time   CHOL 189 01/12/2023 1502   TRIG 181 (H) 01/12/2023 1502   HDL 46 01/12/2023 1502   CHOLHDL 4.1 01/12/2023 1502   CHOLHDL 3.7 09/06/2014 1259   VLDL 29 09/06/2014 1259   LDLCALC 111 (H) 01/12/2023 1502    PHYSICAL EXAM:    VS:  BP (!) 106/58   Pulse 70   Ht 5\' 8"  (1.727 m)   SpO2 95%   BMI 30.77 kg/m   BMI: Body mass index is 30.77 kg/m.  GEN: Well developed male in no acute distress HEENT: normocephalic, atraumatic Neck: no JVD, carotid bruits, or masses Cardiac: RRR; no murmurs, rubs, or gallops, no edema  Respiratory:  significantly decreased throughout, normal work of breathing on O2 , no wheezing, rales or rhonchi. GI: soft, nontender, nondistended, + BS MS: no deformity or atrophy Skin: warm and dry, no rash Neuro:  Alert and Oriented x 3, generalized weakness in wheelchair, frequently falls asleep during office visit but easily arousable, some sporadic RUE extremity jerking not related to episodes of somnolence, occurring while awake Psych: flat affect  Wt Readings from Last 3 Encounters:  01/27/23 202 lb 6.1 oz (91.8 kg)  01/12/23 205 lb (93 kg)  01/06/23 205 lb (93 kg)     ASSESSMENT & PLAN:   1.  Somnolence - this is my primary concern during the visit. Also some atypical RUE jerking. Family has wondered whether he has developed dementia or Parkinson's disease, has not had formal eval for this. I am concerned for a metabolic process contributing such as hypercarbia or elevated ammonia/uremia. Have recommended they proceed to the ED for medical workup. Son in law also concerned he may have had a stroke after his hip replacement so would consider brain imaging for evaluation given mental decline. EDP was in an emergency with another patient so I outlined my concerns on AVS for patient to present to triage and also sent secure chat to EDP.  2. Chronic HFrEF, mild MR - volume status looks OK, weight down 3lb. Still limited by class 3 dyspnea. Given tendency for soft BP and general decline, would not intensify regimen at this time, pending eval for #1. Also awaiting clarification of med list, nurse unable to obtain from facility today. Will arrange close f/u to reassess.  3. PSVT, also remote hx PAF complicated by QT prolongation - maintaining NSR with relatively stable QTC. Agree with Dr. Verna Czech prior commentary that this is difficult to discern given P wave on end of T wave. Per his rounding note 4/11, "Reasonable to continue low dose amio with this QTc on amio given lower risk of QT induced ventricular arrhythmias compared with other class III antiarrhythmics. Nonsustained runs of SVT now in SR, continue current meds." Patient was on 100mg  daily during the hospitalization with discharge AVS indicating 200mg  daily. Today the end of QT interval best seen in V2,  measured between 472-534ms between the two beats, reviewed with Dr. Eden Emms. QTC appears acceptable given his RBBB morphology appearance and comorbidities, but need to clarify amiodarone dose. Nurse tried to contact facility but could not get med list. We'll f/u ER visit to review. It looks like over the last few years he has had to go up and down on  the dose several times for breakthrough symptoms. Does not appear to be a great invasive candidate and BP has limited additional options.  4. Coronary calcification - hold off any acute med adjustment at present time. No recent chest pain. Do not anticipate ischemic evaluation at this time given comorbidities. Remains on baby ASA.     Disposition: F/u with [facility] in 1-2 weeks - rehab is closer to [facility] so they prefer to f/u there short term.   Medication Adjustments/Labs and Tests Ordered: Current medicines are reviewed at length with the patient today.  Concerns regarding medicines are outlined above. Medication changes, Labs and Tests ordered today are summarized above and listed in the Patient Instructions accessible in Encounters.    Signed, Laurann Montana, PA-C  02/14/2023 3:47 PM    [facility] [place] Location in [facility] [address] [place], [place] [address] Ph: [phone]; Fax [phone]

## 2023-02-14 ENCOUNTER — Other Ambulatory Visit: Payer: Self-pay

## 2023-02-14 ENCOUNTER — Inpatient Hospital Stay (HOSPITAL_COMMUNITY)
Admission: EM | Admit: 2023-02-14 | Discharge: 2023-02-18 | DRG: 178 | Disposition: A | Discharge status: Skilled Nursing Facility | Payer: Medicare Other | Source: Skilled Nursing Facility | Attending: Family Medicine | Admitting: Family Medicine

## 2023-02-14 ENCOUNTER — Ambulatory Visit: Payer: Medicare Other | Attending: Physician Assistant | Admitting: Physician Assistant

## 2023-02-14 ENCOUNTER — Emergency Department (HOSPITAL_COMMUNITY): Payer: Medicare Other

## 2023-02-14 ENCOUNTER — Encounter: Payer: Self-pay | Admitting: Physician Assistant

## 2023-02-14 ENCOUNTER — Encounter (HOSPITAL_COMMUNITY): Payer: Self-pay

## 2023-02-14 VITALS — BP 106/58 | HR 70 | Ht 68.0 in

## 2023-02-14 DIAGNOSIS — R4182 Altered mental status, unspecified: Secondary | ICD-10-CM | POA: Diagnosis not present

## 2023-02-14 DIAGNOSIS — I130 Hypertensive heart and chronic kidney disease with heart failure and stage 1 through stage 4 chronic kidney disease, or unspecified chronic kidney disease: Secondary | ICD-10-CM | POA: Diagnosis present

## 2023-02-14 DIAGNOSIS — R9431 Abnormal electrocardiogram [ECG] [EKG]: Secondary | ICD-10-CM

## 2023-02-14 DIAGNOSIS — E8809 Other disorders of plasma-protein metabolism, not elsewhere classified: Secondary | ICD-10-CM | POA: Diagnosis not present

## 2023-02-14 DIAGNOSIS — E876 Hypokalemia: Secondary | ICD-10-CM | POA: Diagnosis present

## 2023-02-14 DIAGNOSIS — I509 Heart failure, unspecified: Secondary | ICD-10-CM | POA: Diagnosis not present

## 2023-02-14 DIAGNOSIS — I2510 Atherosclerotic heart disease of native coronary artery without angina pectoris: Secondary | ICD-10-CM | POA: Diagnosis not present

## 2023-02-14 DIAGNOSIS — R262 Difficulty in walking, not elsewhere classified: Secondary | ICD-10-CM | POA: Diagnosis not present

## 2023-02-14 DIAGNOSIS — I6781 Acute cerebrovascular insufficiency: Secondary | ICD-10-CM | POA: Diagnosis not present

## 2023-02-14 DIAGNOSIS — E0590 Thyrotoxicosis, unspecified without thyrotoxic crisis or storm: Secondary | ICD-10-CM | POA: Diagnosis present

## 2023-02-14 DIAGNOSIS — Z79899 Other long term (current) drug therapy: Secondary | ICD-10-CM

## 2023-02-14 DIAGNOSIS — S7292XD Unspecified fracture of left femur, subsequent encounter for closed fracture with routine healing: Secondary | ICD-10-CM | POA: Diagnosis not present

## 2023-02-14 DIAGNOSIS — K5901 Slow transit constipation: Secondary | ICD-10-CM | POA: Diagnosis not present

## 2023-02-14 DIAGNOSIS — G2581 Restless legs syndrome: Secondary | ICD-10-CM | POA: Diagnosis present

## 2023-02-14 DIAGNOSIS — R29818 Other symptoms and signs involving the nervous system: Secondary | ICD-10-CM | POA: Diagnosis not present

## 2023-02-14 DIAGNOSIS — R413 Other amnesia: Secondary | ICD-10-CM | POA: Diagnosis not present

## 2023-02-14 DIAGNOSIS — R400 Somnolence: Secondary | ICD-10-CM

## 2023-02-14 DIAGNOSIS — I5022 Chronic systolic (congestive) heart failure: Secondary | ICD-10-CM | POA: Diagnosis not present

## 2023-02-14 DIAGNOSIS — I480 Paroxysmal atrial fibrillation: Secondary | ICD-10-CM | POA: Diagnosis not present

## 2023-02-14 DIAGNOSIS — I63532 Cerebral infarction due to unspecified occlusion or stenosis of left posterior cerebral artery: Secondary | ICD-10-CM | POA: Diagnosis not present

## 2023-02-14 DIAGNOSIS — R1030 Lower abdominal pain, unspecified: Secondary | ICD-10-CM | POA: Diagnosis present

## 2023-02-14 DIAGNOSIS — Z833 Family history of diabetes mellitus: Secondary | ICD-10-CM

## 2023-02-14 DIAGNOSIS — J439 Emphysema, unspecified: Secondary | ICD-10-CM | POA: Diagnosis not present

## 2023-02-14 DIAGNOSIS — I2584 Coronary atherosclerosis due to calcified coronary lesion: Secondary | ICD-10-CM

## 2023-02-14 DIAGNOSIS — Z800 Family history of malignant neoplasm of digestive organs: Secondary | ICD-10-CM

## 2023-02-14 DIAGNOSIS — Z6831 Body mass index (BMI) 31.0-31.9, adult: Secondary | ICD-10-CM | POA: Diagnosis not present

## 2023-02-14 DIAGNOSIS — N1831 Chronic kidney disease, stage 3a: Secondary | ICD-10-CM | POA: Diagnosis not present

## 2023-02-14 DIAGNOSIS — G20B2 Parkinson's disease with dyskinesia, with fluctuations: Secondary | ICD-10-CM | POA: Diagnosis not present

## 2023-02-14 DIAGNOSIS — J449 Chronic obstructive pulmonary disease, unspecified: Secondary | ICD-10-CM | POA: Diagnosis present

## 2023-02-14 DIAGNOSIS — R2681 Unsteadiness on feet: Secondary | ICD-10-CM | POA: Diagnosis not present

## 2023-02-14 DIAGNOSIS — I639 Cerebral infarction, unspecified: Secondary | ICD-10-CM | POA: Diagnosis not present

## 2023-02-14 DIAGNOSIS — I471 Supraventricular tachycardia, unspecified: Secondary | ICD-10-CM | POA: Diagnosis not present

## 2023-02-14 DIAGNOSIS — Z7982 Long term (current) use of aspirin: Secondary | ICD-10-CM

## 2023-02-14 DIAGNOSIS — J690 Pneumonitis due to inhalation of food and vomit: Secondary | ICD-10-CM | POA: Diagnosis not present

## 2023-02-14 DIAGNOSIS — I482 Chronic atrial fibrillation, unspecified: Secondary | ICD-10-CM | POA: Diagnosis present

## 2023-02-14 DIAGNOSIS — E119 Type 2 diabetes mellitus without complications: Secondary | ICD-10-CM | POA: Diagnosis not present

## 2023-02-14 DIAGNOSIS — I440 Atrioventricular block, first degree: Secondary | ICD-10-CM | POA: Diagnosis not present

## 2023-02-14 DIAGNOSIS — E669 Obesity, unspecified: Secondary | ICD-10-CM | POA: Diagnosis present

## 2023-02-14 DIAGNOSIS — N390 Urinary tract infection, site not specified: Secondary | ICD-10-CM | POA: Diagnosis not present

## 2023-02-14 DIAGNOSIS — Z7951 Long term (current) use of inhaled steroids: Secondary | ICD-10-CM

## 2023-02-14 DIAGNOSIS — Z9981 Dependence on supplemental oxygen: Secondary | ICD-10-CM | POA: Diagnosis not present

## 2023-02-14 DIAGNOSIS — Z66 Do not resuscitate: Secondary | ICD-10-CM | POA: Diagnosis not present

## 2023-02-14 DIAGNOSIS — J9621 Acute and chronic respiratory failure with hypoxia: Secondary | ICD-10-CM | POA: Diagnosis not present

## 2023-02-14 DIAGNOSIS — Z9841 Cataract extraction status, right eye: Secondary | ICD-10-CM

## 2023-02-14 DIAGNOSIS — H9191 Unspecified hearing loss, right ear: Secondary | ICD-10-CM | POA: Diagnosis present

## 2023-02-14 DIAGNOSIS — E441 Mild protein-calorie malnutrition: Secondary | ICD-10-CM | POA: Diagnosis not present

## 2023-02-14 DIAGNOSIS — F17210 Nicotine dependence, cigarettes, uncomplicated: Secondary | ICD-10-CM | POA: Diagnosis not present

## 2023-02-14 DIAGNOSIS — E1122 Type 2 diabetes mellitus with diabetic chronic kidney disease: Secondary | ICD-10-CM | POA: Diagnosis present

## 2023-02-14 DIAGNOSIS — Z7984 Long term (current) use of oral hypoglycemic drugs: Secondary | ICD-10-CM

## 2023-02-14 DIAGNOSIS — Z880 Allergy status to penicillin: Secondary | ICD-10-CM

## 2023-02-14 DIAGNOSIS — Y95 Nosocomial condition: Principal | ICD-10-CM

## 2023-02-14 DIAGNOSIS — R059 Cough, unspecified: Secondary | ICD-10-CM | POA: Diagnosis not present

## 2023-02-14 DIAGNOSIS — Z881 Allergy status to other antibiotic agents status: Secondary | ICD-10-CM

## 2023-02-14 DIAGNOSIS — J189 Pneumonia, unspecified organism: Principal | ICD-10-CM | POA: Diagnosis present

## 2023-02-14 DIAGNOSIS — J9611 Chronic respiratory failure with hypoxia: Secondary | ICD-10-CM | POA: Diagnosis not present

## 2023-02-14 DIAGNOSIS — Z96643 Presence of artificial hip joint, bilateral: Secondary | ICD-10-CM | POA: Diagnosis present

## 2023-02-14 DIAGNOSIS — E785 Hyperlipidemia, unspecified: Secondary | ICD-10-CM | POA: Diagnosis present

## 2023-02-14 DIAGNOSIS — M545 Low back pain, unspecified: Secondary | ICD-10-CM | POA: Diagnosis present

## 2023-02-14 DIAGNOSIS — S72002A Fracture of unspecified part of neck of left femur, initial encounter for closed fracture: Secondary | ICD-10-CM | POA: Diagnosis not present

## 2023-02-14 DIAGNOSIS — R531 Weakness: Secondary | ICD-10-CM

## 2023-02-14 DIAGNOSIS — Z9842 Cataract extraction status, left eye: Secondary | ICD-10-CM

## 2023-02-14 DIAGNOSIS — I340 Nonrheumatic mitral (valve) insufficiency: Secondary | ICD-10-CM

## 2023-02-14 DIAGNOSIS — R0602 Shortness of breath: Secondary | ICD-10-CM | POA: Diagnosis not present

## 2023-02-14 DIAGNOSIS — N400 Enlarged prostate without lower urinary tract symptoms: Secondary | ICD-10-CM | POA: Diagnosis present

## 2023-02-14 LAB — BLOOD GAS, VENOUS
Acid-Base Excess: 6.8 mmol/L — ABNORMAL HIGH (ref 0.0–2.0)
Bicarbonate: 34.2 mmol/L — ABNORMAL HIGH (ref 20.0–28.0)
Drawn by: 65579
O2 Saturation: 56.8 %
Patient temperature: 36.7
pCO2, Ven: 61 mmHg — ABNORMAL HIGH (ref 44–60)
pH, Ven: 7.35 (ref 7.25–7.43)
pO2, Ven: 35 mmHg (ref 32–45)

## 2023-02-14 LAB — CBC WITH DIFFERENTIAL/PLATELET
Abs Immature Granulocytes: 0.12 10*3/uL — ABNORMAL HIGH (ref 0.00–0.07)
Basophils Absolute: 0 10*3/uL (ref 0.0–0.1)
Basophils Relative: 0 %
Eosinophils Absolute: 0.4 10*3/uL (ref 0.0–0.5)
Eosinophils Relative: 3 %
HCT: 39 % (ref 39.0–52.0)
Hemoglobin: 12.1 g/dL — ABNORMAL LOW (ref 13.0–17.0)
Immature Granulocytes: 1 %
Lymphocytes Relative: 11 %
Lymphs Abs: 1.3 10*3/uL (ref 0.7–4.0)
MCH: 30.6 pg (ref 26.0–34.0)
MCHC: 31 g/dL (ref 30.0–36.0)
MCV: 98.7 fL (ref 80.0–100.0)
Monocytes Absolute: 1.5 10*3/uL — ABNORMAL HIGH (ref 0.1–1.0)
Monocytes Relative: 13 %
Neutro Abs: 8.7 10*3/uL — ABNORMAL HIGH (ref 1.7–7.7)
Neutrophils Relative %: 72 %
Platelets: 263 10*3/uL (ref 150–400)
RBC: 3.95 MIL/uL — ABNORMAL LOW (ref 4.22–5.81)
RDW: 14.4 % (ref 11.5–15.5)
WBC: 12 10*3/uL — ABNORMAL HIGH (ref 4.0–10.5)
nRBC: 0 % (ref 0.0–0.2)

## 2023-02-14 LAB — COMPREHENSIVE METABOLIC PANEL
ALT: 17 U/L (ref 0–44)
AST: 13 U/L — ABNORMAL LOW (ref 15–41)
Albumin: 2.7 g/dL — ABNORMAL LOW (ref 3.5–5.0)
Alkaline Phosphatase: 94 U/L (ref 38–126)
Anion gap: 10 (ref 5–15)
BUN: 32 mg/dL — ABNORMAL HIGH (ref 8–23)
CO2: 31 mmol/L (ref 22–32)
Calcium: 9.7 mg/dL (ref 8.9–10.3)
Chloride: 100 mmol/L (ref 98–111)
Creatinine, Ser: 1.52 mg/dL — ABNORMAL HIGH (ref 0.61–1.24)
GFR, Estimated: 45 mL/min — ABNORMAL LOW (ref >60)
Glucose, Bld: 107 mg/dL — ABNORMAL HIGH (ref 70–99)
Potassium: 3.2 mmol/L — ABNORMAL LOW (ref 3.5–5.1)
Sodium: 141 mmol/L (ref 135–145)
Total Bilirubin: 0.7 mg/dL (ref 0.3–1.2)
Total Protein: 7.3 g/dL (ref 6.5–8.1)

## 2023-02-14 LAB — CULTURE, BLOOD (ROUTINE X 2)

## 2023-02-14 LAB — LACTIC ACID, PLASMA: Lactic Acid, Venous: 0.9 mmol/L (ref 0.5–1.9)

## 2023-02-14 LAB — AMMONIA: Ammonia: <10 umol/L (ref 9–35)

## 2023-02-14 MED ORDER — SODIUM CHLORIDE 0.9 % IV SOLN
2.0000 g | Freq: Once | INTRAVENOUS | Status: AC
  Administered 2023-02-14: 2 g via INTRAVENOUS
  Filled 2023-02-14: qty 10

## 2023-02-14 MED ORDER — VANCOMYCIN HCL 750 MG/150ML IV SOLN
750.0000 mg | INTRAVENOUS | Status: DC
  Administered 2023-02-16: 750 mg via INTRAVENOUS
  Filled 2023-02-14 (×2): qty 150

## 2023-02-14 MED ORDER — VANCOMYCIN HCL 1500 MG/300ML IV SOLN
1500.0000 mg | Freq: Once | INTRAVENOUS | Status: AC
  Administered 2023-02-14: 1500 mg via INTRAVENOUS
  Filled 2023-02-14: qty 300

## 2023-02-14 MED ORDER — IOHEXOL 350 MG/ML SOLN
75.0000 mL | Freq: Once | INTRAVENOUS | Status: AC | PRN
  Administered 2023-02-14: 75 mL via INTRAVENOUS

## 2023-02-14 MED ORDER — SODIUM CHLORIDE 0.9 % IV BOLUS
1000.0000 mL | Freq: Once | INTRAVENOUS | Status: AC
  Administered 2023-02-14: 1000 mL via INTRAVENOUS

## 2023-02-14 NOTE — Progress Notes (Signed)
Pharmacy Antibiotic Note  Gerald Hall is a 88 y.o. male admitted on 02/14/2023 with pneumonia.  Pharmacy has been consulted for Vancomycin dosing.  Pt also received Aztreonam x 1 dose in ED.  PCN allergy = GI upset, Cephalexin allergy = diarrhea/weakness.  Has tolerated other cephalosporins in the past.  Plan: Vanc  IV x 1, then  IV q24h eAUC 478, SCr 1.52, Vd 0.5 for BMI >30  Follow-up for addition of gram negative coverage.  Weight: 95.3 kg (210 lb)  Temp (24hrs), Avg:97.6 F (36.4 C), Min:97.6 F (36.4 C), Max:97.6 F (36.4 C)  Recent Labs  Lab 02/14/23 1842 02/14/23 2127  WBC 12.0*  --   CREATININE 1.52*  --   LATICACIDVEN  --  0.9    Estimated Creatinine Clearance: 39.8 mL/min (A) (by C-G formula based on SCr of 1.52 mg/dL (H)).    Allergies  Allergen Reactions   Cephalexin Diarrhea    And general weakness   Penicillins Other (See Comments)    GI UPSET    Antimicrobials this admission: Aztreonam x 1 in ED 4/22 Vanc 4/22 >>   Dose adjustments this admission:   Microbiology results:   Thank you for allowing pharmacy to be a part of this patient's care.  [employer], Pharm.D., BCPS Clinical Pharmacist  02/14/2023 10:01 PM

## 2023-02-14 NOTE — ED Provider Notes (Signed)
[place] EMERGENCY DEPARTMENT AT [facility] Provider Note   CSN: [id number] Arrival date & time: 02/14/23  1630     History {Add pertinent medical, surgical, social history, OB history to HPI:1} Chief Complaint  Patient presents with   Fatigue    Gerald Hall is a 88 y.o. male.  Patient has a history of coronary artery disease, diabetes, kidney disease and recent fractured hip that operated on April 4.  He comes in today with worsening cough more fatigue and lethargy.   Weakness      Home Medications Prior to Admission medications   Medication Sig Start Date End Date Taking? Authorizing Provider  acetaminophen (TYLENOL) 500 MG tablet Take 1,000 mg by mouth every 6 (six) hours as needed for mild pain or headache.    [provider]  albuterol (VENTOLIN HFA) 108 (90 Base) MCG/ACT inhaler Inhale 2 puffs into the lungs every 4 (four) hours as needed for wheezing. 09/01/22   Babs Sciara, MD  amiodarone (PACERONE) 200 MG tablet Take 1 tablet (200 mg total) by mouth daily. 01/06/23   Jonelle Sidle, MD  aspirin EC 81 MG tablet Take 1 tablet (81 mg total) by mouth 2 (two) times daily. Swallow whole. 01/31/23   Sherryll Burger, Pratik D, DO  budesonide-formoterol (SYMBICORT) 160-4.5 MCG/ACT inhaler Inhale 2 puffs into the lungs 2 (two) times daily. 08/27/22   Babs Sciara, MD  calcium carbonate (OS-CAL) 600 MG TABS tablet Take 600 mg by mouth daily with breakfast.    [provider]  carvedilol (COREG) 3.125 MG tablet Take 1 tablet (3.125 mg total) by mouth 2 (two) times daily with a meal. 11/16/22   Luking, Jonna Coup, MD  cholecalciferol (VITAMIN D) 400 units TABS tablet Take 1,000 Units by mouth daily.    [provider]  clonazePAM (KLONOPIN) 0.5 MG tablet Take 1 tablet (0.5 mg total) by mouth at bedtime as needed (insomnia). 01/31/23   Sherryll Burger, Pratik D, DO  Dextromethorphan-guaiFENesin (MUCINEX DM) 30-600 MG TB12 Take 1 tablet by mouth daily.      [provider]  finasteride (PROSCAR) 5 MG tablet TAKE ONE TABLET BY MOUTH EVERY DAY 12/30/22   Babs Sciara, MD  fluticasone (FLONASE) 50 MCG/ACT nasal spray Place 2 sprays into both nostrils at bedtime. 08/27/22   Babs Sciara, MD  ipratropium-albuterol (DUONEB) 0.5-2.5 (3) MG/3ML SOLN Take 3 mLs by nebulization every 4 (four) hours as needed. Patient taking differently: Take 3 mLs by nebulization every 4 (four) hours as needed (shortness of breath). 08/27/22   Luking, Jonna Coup, MD  JARDIANCE 10 MG TABS tablet TAKE ONE TABLET BY MOUTH ONCE DAILY BEFORE BREAKFAST Patient taking differently: Take 10 mg by mouth daily. 11/17/22   Jonelle Sidle, MD  methimazole (TAPAZOLE) 5 MG tablet TAKE ONE TABLET BY MOUTH EVERY DAY 12/21/22   Roma Kayser, MD  mirabegron ER (MYRBETRIQ) 50 MG TB24 tablet Take 50 mg by mouth daily.    [provider]  oxyCODONE (ROXICODONE) 5 MG immediate release tablet Take 1 tablet (5 mg total) by mouth every 8 (eight) hours as needed for severe pain or breakthrough pain. 01/31/23 01/31/24  Sherryll Burger, Pratik D, DO  OXYGEN Inhale 2 L into the lungs continuous.    [provider]  oxymetazoline (AFRIN) 0.05 % nasal spray Place 1 spray into both nostrils at bedtime.    [provider]  polyethylene glycol (MIRALAX / GLYCOLAX) 17 g packet Take 17 g by  mouth daily as needed for mild constipation.    [provider]  polyvinyl alcohol (LIQUIFILM TEARS) 1.4 % ophthalmic solution Place 1 drop into both eyes as needed for dry eyes.    [provider]  pregabalin (LYRICA) 25 MG capsule Take 1 capsule (25 mg total) by mouth 2 (two) times daily. 01/31/23   Sherryll Burger, Pratik D, DO  rOPINIRole (REQUIP) 2 MG tablet Take 1 tablet (2 mg total) by mouth 2 (two) times daily. 01/31/23 03/02/23  Sherryll Burger, Pratik D, DO  tamsulosin (FLOMAX) 0.4 MG CAPS capsule TAKE (1) CAPSULE BY MOUTH TWICE DAILY. Patient taking differently: Take 0.4 mg by mouth 2 (two) times  daily. 09/03/22   Babs Sciara, MD  torsemide (DEMADEX) 10 MG tablet Take 1 tablet (10 mg total) by mouth every other day. 02/06/23 03/08/23  Arrien, York Ram, MD  traMADol (ULTRAM) 50 MG tablet Take 1 tablet (50 mg total) by mouth 3 (three) times daily as needed for moderate pain or severe pain. 01/31/23   Sherryll Burger, Pratik D, DO  vitamin C (ASCORBIC ACID) 500 MG tablet Take 500 mg by mouth daily.    [provider]  Zinc Acetate, Oral, (ZINC ACETATE PO) Take 1 tablet by mouth daily.    [provider]      Allergies    Cephalexin and Penicillins    Review of Systems   Review of Systems  Neurological:  Positive for weakness.    Physical Exam Updated Vital Signs BP (!) 121/58   Pulse 65   Temp 97.8 F (36.6 C)   Resp 17   Wt 95.3 kg   SpO2 95%   BMI 31.93 kg/m  Physical Exam  ED Results / Procedures / Treatments   Labs (all labs ordered are listed, but only abnormal results are displayed) Labs Reviewed  CBC WITH DIFFERENTIAL/PLATELET - Abnormal; Notable for the following components:      Result Value   WBC 12.0 (*)    RBC 3.95 (*)    Hemoglobin 12.1 (*)    Neutro Abs 8.7 (*)    Monocytes Absolute 1.5 (*)    Abs Immature Granulocytes 0.12 (*)    All other components within normal limits  COMPREHENSIVE METABOLIC PANEL - Abnormal; Notable for the following components:   Potassium 3.2 (*)    Glucose, Bld 107 (*)    BUN 32 (*)    Creatinine, Ser 1.52 (*)    Albumin 2.7 (*)    AST 13 (*)    GFR, Estimated 45 (*)    All other components within normal limits  BLOOD GAS, VENOUS - Abnormal; Notable for the following components:   pCO2, Ven 61 (*)    Bicarbonate 34.2 (*)    Acid-Base Excess 6.8 (*)    All other components within normal limits  CULTURE, BLOOD (ROUTINE X 2)  CULTURE, BLOOD (ROUTINE X 2)  AMMONIA  LACTIC ACID, PLASMA    EKG None  Radiology CT ANGIO HEAD NECK W WO CM  Result Date: 02/14/2023 CLINICAL DATA:  Initial evaluation for  neuro deficit, stroke. EXAM: CT ANGIOGRAPHY HEAD AND NECK WITH AND WITHOUT CONTRAST TECHNIQUE: Multidetector CT imaging of the head and neck was performed using the standard protocol during bolus administration of intravenous contrast. Multiplanar CT image reconstructions and MIPs were obtained to evaluate the vascular anatomy. Carotid stenosis measurements (when applicable) are obtained utilizing NASCET criteria, using the distal internal carotid diameter as the denominator. RADIATION DOSE REDUCTION: This exam was performed according  to the departmental dose-optimization program which includes automated exposure control, adjustment of the mA and/or kV according to patient size and/or use of iterative reconstruction technique. CONTRAST:  75mL OMNIPAQUE IOHEXOL 350 MG/ML SOLN COMPARISON:  CT from earlier the same day. FINDINGS: CTA NECK FINDINGS Aortic arch: Visualized aortic arch normal in caliber with standard branch pattern. Atheromatous change about the arch itself. No stenosis about the origin of the great vessels. Right carotid system: Right common and internal carotid arteries are tortuous without dissection. Mild atheromatous change about the right carotid bulb without hemodynamically significant greater than 50% stenosis. Left carotid system: Left common and internal carotid arteries are tortuous without evidence for dissection. Mild atheromatous change without hemodynamically significant stenosis. Vertebral arteries: Both vertebral arteries arise from subclavian arteries. No proximal subclavian artery stenosis. Vertebral arteries are patent without stenosis or dissection. Skeleton: No worrisome osseous lesions. Prior ACDF at C4-C5. Advanced spondylosis at C5-6 and C6-7. Large geode formation noted at the dens. Degenerative changes noted about the right TMJ. Other neck: No other acute soft tissue abnormality within the neck. 2 cm soft tissue mass present at the inferior left parotid gland (series 4, image  80), indeterminate. Similar 1.3 cm soft tissue lesion present within the contralateral right parotid gland (series 4, image 91). Upper chest: Hazy subsegmental atelectatic changes noted within the visualized lungs. Scattered small volume secretions noted within the mid esophageal lumen. Review of the MIP images confirms the above findings CTA HEAD FINDINGS Anterior circulation: Atheromatous irregularity within the carotid siphons without hemodynamically significant stenosis. 3 mm outpouching extending inferiorly from the supraclinoid right ICA felt to be most consistent with a vascular infundibulum related to a hypoplastic right PCOM. A1 segments patent bilaterally. Normal anterior communicating complex. Anterior cerebral arteries are patent without significant stenosis. Right M1 segment widely patent. Short-segment mild-to-moderate stenosis involving the mid left M1 segment noted (series 9, image 21). No proximal MCA branch occlusion or high-grade stenosis. Distal MCA branches are perfused and symmetric. Posterior circulation: Both V4 segments are tortuous but widely patent without stenosis. Both PICA patent at their origins. Basilar patent without stenosis. Superior cerebellar arteries patent bilaterally. Both PCAs primarily supplied via the basilar. Right PCA widely patent to its distal aspect. Atheromatous irregularity and tortuosity about the proximal left ECA with associated moderate left P1/P2 stenoses (series 9, image 20). Left PCA otherwise patent to its distal aspect. Venous sinuses: Patent allowing for timing the contrast bolus. Anatomic variants: None significant.  No aneurysm. Review of the MIP images confirms the above findings IMPRESSION: 1. Negative CTA for large vessel occlusion or other emergent finding. 2. Intracranial atherosclerotic disease with associated short-segment mild-to-moderate mid left M1 segment stenosis, with moderate proximal left PCA stenoses. 3. Diffuse tortuosity of the major  arterial vasculature of the head and neck, suggesting chronic underlying hypertension. 4. Bilateral parotid lesions measuring up to 2 cm on the left, indeterminate. Nonemergent outpatient ENT referral for further workup and evaluation could be performed for further evaluation as warranted. The Electronically Signed   By: Rise Mu M.D.   On: 02/14/2023 22:22   CT Head Wo Contrast  Result Date: 02/14/2023 CLINICAL DATA:  Memory loss, shortness of breath EXAM: CT HEAD WITHOUT CONTRAST TECHNIQUE: Contiguous axial images were obtained from the base of the skull through the vertex without intravenous contrast. RADIATION DOSE REDUCTION: This exam was performed according to the departmental dose-optimization program which includes automated exposure control, adjustment of the mA and/or kV according to patient size and/or use of  iterative reconstruction technique. COMPARISON:  None Available. FINDINGS: Brain: Periventricular white matter changes, likely the sequela of chronic small vessel ischemic disease. Additional hypodensity is noted throughout the bilateral basal ganglia and thalami, which is technically age indeterminate but may be the sequela of acute or subacute infarcts. No evidence of acute hemorrhage, mass, mass effect, or midline shift. No hydrocephalus or extra-axial fluid collection. Cerebral volume is overall within normal limits for age. Vascular: No hyperdense vessel. Skull: Negative for fracture or focal lesion. Sinuses/Orbits: Mucosal thickening in the ethmoid air cells and sphenoid sinuses, with left sphenoid sinus air-fluid level. Status post bilateral lens replacements. Other: Small amount of fluid in the right mastoid air cells. IMPRESSION: 1. Hypodensity throughout the bilateral basal ganglia and thalami, which is technically age indeterminate and favored to be chronic but could be the sequela of acute or subacute infarcts. Correlate with symptoms and consider MRI if there is clinical  concern for acute or subacute infarct. 2. Mucosal thickening in the ethmoid air cells and sphenoid sinuses, with left sphenoid sinus air-fluid level, which can be seen in the setting of acute sinusitis. Electronically Signed   By: Wiliam Ke M.D.   On: 02/14/2023 17:34   DG Chest 2 View  Result Date: 02/14/2023 CLINICAL DATA:  Hip surgery 3 weeks ago, decreased mental status, shortness of breath, cough EXAM: CHEST - 2 VIEW COMPARISON:  02/01/2023 FINDINGS: Frontal and lateral views of the chest demonstrate an unremarkable cardiac silhouette. There is left lower lobe consolidation, which could reflect pneumonia or aspiration. No significant pleural effusion. Right chest is clear. No pneumothorax. No acute bony abnormalities. IMPRESSION: 1. Left lower lobe airspace disease consistent with infection or aspiration. Electronically Signed   By: Sharlet Salina M.D.   On: 02/14/2023 17:04    Procedures Procedures  {Document cardiac monitor, telemetry assessment procedure when appropriate:1}  Medications Ordered in ED Medications  vancomycin (VANCOREADY) IVPB 1500 mg/300 mL (1,500 mg Intravenous New Bag/Given 02/14/23 2229)    Followed by  vancomycin (VANCOREADY) IVPB 750 mg/150 mL (has no administration in time range)  sodium chloride 0.9 % bolus 1,000 mL (0 mLs Intravenous Stopped 02/14/23 2317)  aztreonam (AZACTAM) 2 g in sodium chloride 0.9 % 100 mL IVPB (0 g Intravenous Stopped 02/14/23 2224)  iohexol (OMNIPAQUE) 350 MG/ML injection 75 mL (75 mLs Intravenous Contrast Given 02/14/23 2128)    ED Course/ Medical Decision Making/ A&P   {   Click here for ABCD2, HEART and other calculatorsREFRESH Note before signing :1}                          Medical Decision Making Amount and/or Complexity of Data Reviewed Labs: ordered. Radiology: ordered.  Risk Prescription drug management. Decision regarding hospitalization.   Patient with left lower lobe pneumonia.  He will be admitted to  medicine  {Document critical care time when appropriate:1} {Document review of labs and clinical decision tools ie heart score, Chads2Vasc2 etc:1}  {Document your independent review of radiology images, and any outside records:1} {Document your discussion with family members, caretakers, and with consultants:1} {Document social determinants of health affecting pt's care:1} {Document your decision making why or why not admission, treatments were needed:1} Final Clinical Impression(s) / ED Diagnoses Final diagnoses:  Hospital-acquired pneumonia    Rx / DC Orders ED Discharge Orders     None

## 2023-02-14 NOTE — H&P (Signed)
History and Physical    Patient: Gerald Hall WUJ:[id number] DOB: 1934-12-14 DOA: 02/14/2023 DOS: the patient was seen and examined on 02/15/2023 PCP: Babs Sciara, MD  Patient coming from: SNF  Chief Complaint:  Chief Complaint  Patient presents with   Fatigue   HPI: Gerald Hall is an 88 y.o. male with medical history significant of COPD on supplemental oxygen via [place] at 2 LPM at baseline, atrial fibrillation, CKD 3, BPH, HFrEF (echo done in February 2024 showed LVEF of 30 to 35%) who presents to the emergency department from cardiology office due to being somnolent during the office visit. Patient was admitted from 4/32/12 due to closed displaced fracture of the left femoral neck s/p left hip hemiarthroplasty and was discharged to a SNF.  Most of the history was obtained from son-in-law at bedside.  Per report, patient has not been able to return to his baseline of functioning since the surgery.  He used to be able to to take care of some of his ADLs, but now requires maximal assist to do almost anything, his mental cognition was also said to have declined and family was worried about possible stroke.  Patient was also reported not to have been able to stand or ambulate since the surgery (different from baseline).  He has several days of nonproductive cough with chest congestion.  ED Course:  In the emergency department, BP was 117/100, other vital signs were within normal range.  Workup in the ED showed normal CBC except for WBC of 12.0 and hemoglobin of 12.1.  BMP showed sodium 141, potassium 3.2, chloride 100, bicarb 31, glucose 107, BUN 32, creatinine 1.52 (creatinine is within baseline range), albumin 2.7, lactic acid was normal, ammonia was less than 10.  Blood culture pending. CT head without contrast showed 1. Hypodensity throughout the bilateral basal ganglia and thalami, which is technically age indeterminate and favored to be chronic but could be the sequela of acute or  subacute infarcts. Correlate with symptoms and consider MRI if there is clinical concern for acute or subacute infarct. 2. Mucosal thickening in the ethmoid air cells and sphenoid sinuses, with left sphenoid sinus air-fluid level, which can be seen in the setting of acute sinusitis. CT angiography of head and neck with and without contrast showed negative CTA for large vessel occlusion or other emergent finding. Chest x-ray showed left lower lobe airspace disease consistent with infection or aspiration Patient was treated with vancomycin and aztreonam, IV hydration was provided  Review of Systems: Review of systems as noted in the HPI. All other systems reviewed and are negative.   Past Medical History:  Diagnosis Date   Aortic atherosclerosis 06/23/2022   Seen on CAT scan   Arthritis    Balance problem    Uses a walker   Blood in urine    Sees urology yearly   Chronic HFrEF (heart failure with reduced ejection fraction)    Chronic kidney disease, stage 3    Coronary artery calcification seen on CT scan    Deafness in right ear age 56   Hyperlipidemia    Hypertension    Intractable nausea and vomiting 02/04/2023   Prolonged QT interval    PSVT (paroxysmal supraventricular tachycardia)    Temporal arteritis 2016   Urinary incontinence    Past Surgical History:  Procedure Laterality Date   BACK SURGERY  2010   BELPHAROPTOSIS REPAIR     CATARACT EXTRACTION Bilateral 07/30/14, 08/13/2014   CERVICAL SPINE SURGERY  09/01/15   C 4-5 , [facility] , [place]   COLONOSCOPY  12/19/2007   RMR: 1. External hemorrohids, otherwise normal rectum.2. Normal colon. 3. Normal terminal ileum.   COLONOSCOPY N/A 12/13/2014   Procedure: COLONOSCOPY;  Surgeon: Corbin Ade, MD;  Location: [facility] SUITE;  Service: Endoscopy;  Laterality: N/A;  11:15 Pt Request Time   HIP ARTHROPLASTY Left 01/27/2023   Procedure: ARTHROPLASTY BIPOLAR HIP (HEMIARTHROPLASTY);  Surgeon: Oliver Barre, MD;  Location: [facility]  ORS;  Service: Orthopedics;  Laterality: Left;   KNEE SURGERY Left    around 2000, arthroscopic   TOTAL HIP ARTHROPLASTY Right 02/23/2017   Procedure: TOTAL HIP ARTHROPLASTY ANTERIOR APPROACH;  Surgeon: Kennedy Bucker, MD;  Location: [facility] ORS;  Service: Orthopedics;  Laterality: Right;    Social History:  reports that he has been smoking cigarettes. He has been smoking an average of .5 packs per day. He has never used smokeless tobacco. He reports current alcohol use. He reports that he does not use drugs.   Allergies  Allergen Reactions   Cephalexin Diarrhea    And general weakness   Penicillins Other (See Comments)    GI UPSET    Family History  Problem Relation Age of Onset   Colon cancer Brother    Diabetes Brother    Diabetes Brother      Prior to Admission medications   Medication Sig Start Date End Date Taking? Authorizing Provider  acetaminophen (TYLENOL) 500 MG tablet Take 1,000 mg by mouth every 6 (six) hours as needed for mild pain or headache.    [provider]  albuterol (VENTOLIN HFA) 108 (90 Base) MCG/ACT inhaler Inhale 2 puffs into the lungs every 4 (four) hours as needed for wheezing. 09/01/22   Babs Sciara, MD  amiodarone (PACERONE) 200 MG tablet Take 1 tablet (200 mg total) by mouth daily. 01/06/23   Jonelle Sidle, MD  aspirin EC 81 MG tablet Take 1 tablet (81 mg total) by mouth 2 (two) times daily. Swallow whole. 01/31/23   Sherryll Burger, Pratik D, DO  budesonide-formoterol (SYMBICORT) 160-4.5 MCG/ACT inhaler Inhale 2 puffs into the lungs 2 (two) times daily. 08/27/22   Babs Sciara, MD  calcium carbonate (OS-CAL) 600 MG TABS tablet Take 600 mg by mouth daily with breakfast.    [provider]  carvedilol (COREG) 3.125 MG tablet Take 1 tablet (3.125 mg total) by mouth 2 (two) times daily with a meal. 11/16/22   Luking, Jonna Coup, MD  cholecalciferol (VITAMIN D) 400 units TABS tablet Take 1,000 Units by mouth daily.    [provider]   clonazePAM (KLONOPIN) 0.5 MG tablet Take 1 tablet (0.5 mg total) by mouth at bedtime as needed (insomnia). 01/31/23   Sherryll Burger, Pratik D, DO  Dextromethorphan-guaiFENesin (MUCINEX DM) 30-600 MG TB12 Take 1 tablet by mouth daily.     [provider]  finasteride (PROSCAR) 5 MG tablet TAKE ONE TABLET BY MOUTH EVERY DAY 12/30/22   Babs Sciara, MD  fluticasone (FLONASE) 50 MCG/ACT nasal spray Place 2 sprays into both nostrils at bedtime. 08/27/22   Babs Sciara, MD  ipratropium-albuterol (DUONEB) 0.5-2.5 (3) MG/3ML SOLN Take 3 mLs by nebulization every 4 (four) hours as needed. Patient taking differently: Take 3 mLs by nebulization every 4 (four) hours as needed (shortness of breath). 08/27/22   Luking, Jonna Coup, MD  JARDIANCE 10 MG TABS tablet TAKE ONE TABLET BY MOUTH ONCE DAILY BEFORE BREAKFAST Patient taking differently: Take 10 mg  by mouth daily. 11/17/22   Jonelle Sidle, MD  methimazole (TAPAZOLE) 5 MG tablet TAKE ONE TABLET BY MOUTH EVERY DAY 12/21/22   Roma Kayser, MD  mirabegron ER (MYRBETRIQ) 50 MG TB24 tablet Take 50 mg by mouth daily.    [provider]  oxyCODONE (ROXICODONE) 5 MG immediate release tablet Take 1 tablet (5 mg total) by mouth every 8 (eight) hours as needed for severe pain or breakthrough pain. 01/31/23 01/31/24  Sherryll Burger, Pratik D, DO  OXYGEN Inhale 2 L into the lungs continuous.    [provider]  oxymetazoline (AFRIN) 0.05 % nasal spray Place 1 spray into both nostrils at bedtime.    [provider]  polyethylene glycol (MIRALAX / GLYCOLAX) 17 g packet Take 17 g by mouth daily as needed for mild constipation.    [provider]  polyvinyl alcohol (LIQUIFILM TEARS) 1.4 % ophthalmic solution Place 1 drop into both eyes as needed for dry eyes.    [provider]  pregabalin (LYRICA) 25 MG capsule Take 1 capsule (25 mg total) by mouth 2 (two) times daily. 01/31/23   Sherryll Burger, Pratik D, DO  rOPINIRole (REQUIP) 2 MG tablet Take 1  tablet (2 mg total) by mouth 2 (two) times daily. 01/31/23 03/02/23  Sherryll Burger, Pratik D, DO  tamsulosin (FLOMAX) 0.4 MG CAPS capsule TAKE (1) CAPSULE BY MOUTH TWICE DAILY. Patient taking differently: Take 0.4 mg by mouth 2 (two) times daily. 09/03/22   Babs Sciara, MD  torsemide (DEMADEX) 10 MG tablet Take 1 tablet (10 mg total) by mouth every other day. 02/06/23 03/08/23  Arrien, York Ram, MD  traMADol (ULTRAM) 50 MG tablet Take 1 tablet (50 mg total) by mouth 3 (three) times daily as needed for moderate pain or severe pain. 01/31/23   Sherryll Burger, Pratik D, DO  vitamin C (ASCORBIC ACID) 500 MG tablet Take 500 mg by mouth daily.    [provider]  Zinc Acetate, Oral, (ZINC ACETATE PO) Take 1 tablet by mouth daily.    [provider]    Physical Exam: BP 129/61   Pulse 71   Temp 98 F (36.7 C)   Resp 18   Wt 95.3 kg   SpO2 97%   BMI 31.93 kg/m   General: 88 y.o. year-old male well developed well nourished in no acute distress.  Alert and oriented x3. HEENT: NCAT, EOMI, HOH Neck: Supple, trachea medial Cardiovascular: Regular rate and rhythm with no rubs or gallops.  No thyromegaly or JVD noted.  No lower extremity edema. 2/4 pulses in all 4 extremities. Respiratory: Rhonchi in LLL on auscultation with no wheezes.   Abdomen: Soft, nontender nondistended with normal bowel sounds x4 quadrants. Muskuloskeletal: No cyanosis, clubbing or edema noted bilaterally Neuro: CN II-XII intact, strength 5/5 x 4, sensation, reflexes intact Skin: No ulcerative lesions noted or rashes Psychiatry: Mood is appropriate for condition and setting          Labs on Admission:  Basic Metabolic Panel: Recent Labs  Lab 02/14/23 1842  NA 141  K 3.2*  CL 100  CO2 31  GLUCOSE 107*  BUN 32*  CREATININE 1.52*  CALCIUM 9.7   Liver Function Tests: Recent Labs  Lab 02/14/23 1842  AST 13*  ALT 17  ALKPHOS 94  BILITOT 0.7  PROT 7.3  ALBUMIN 2.7*   No results for input(s): "LIPASE",  "AMYLASE" in the last 168 hours. Recent Labs  Lab 02/14/23 1842  AMMONIA <10   CBC:  Recent Labs  Lab 02/14/23 1842  WBC 12.0*  NEUTROABS 8.7*  HGB 12.1*  HCT 39.0  MCV 98.7  PLT 263   Cardiac Enzymes: No results for input(s): "CKTOTAL", "CKMB", "CKMBINDEX", "TROPONINI" in the last 168 hours.  BNP (last 3 results) Recent Labs    08/03/22 1543 08/27/22 1221 01/26/23 1823  BNP 208.0* 396.0* 253.0*    ProBNP (last 3 results) No results for input(s): "PROBNP" in the last 8760 hours.  CBG: No results for input(s): "GLUCAP" in the last 168 hours.  Radiological Exams on Admission: CT ANGIO HEAD NECK W WO CM  Result Date: 02/14/2023 CLINICAL DATA:  Initial evaluation for neuro deficit, stroke. EXAM: CT ANGIOGRAPHY HEAD AND NECK WITH AND WITHOUT CONTRAST TECHNIQUE: Multidetector CT imaging of the head and neck was performed using the standard protocol during bolus administration of intravenous contrast. Multiplanar CT image reconstructions and MIPs were obtained to evaluate the vascular anatomy. Carotid stenosis measurements (when applicable) are obtained utilizing NASCET criteria, using the distal internal carotid diameter as the denominator. RADIATION DOSE REDUCTION: This exam was performed according to the departmental dose-optimization program which includes automated exposure control, adjustment of the mA and/or kV according to patient size and/or use of iterative reconstruction technique. CONTRAST:  75mL OMNIPAQUE IOHEXOL 350 MG/ML SOLN COMPARISON:  CT from earlier the same day. FINDINGS: CTA NECK FINDINGS Aortic arch: Visualized aortic arch normal in caliber with standard branch pattern. Atheromatous change about the arch itself. No stenosis about the origin of the great vessels. Right carotid system: Right common and internal carotid arteries are tortuous without dissection. Mild atheromatous change about the right carotid bulb without hemodynamically significant greater than 50%  stenosis. Left carotid system: Left common and internal carotid arteries are tortuous without evidence for dissection. Mild atheromatous change without hemodynamically significant stenosis. Vertebral arteries: Both vertebral arteries arise from subclavian arteries. No proximal subclavian artery stenosis. Vertebral arteries are patent without stenosis or dissection. Skeleton: No worrisome osseous lesions. Prior ACDF at C4-C5. Advanced spondylosis at C5-6 and C6-7. Large geode formation noted at the dens. Degenerative changes noted about the right TMJ. Other neck: No other acute soft tissue abnormality within the neck. 2 cm soft tissue mass present at the inferior left parotid gland (series 4, image 80), indeterminate. Similar 1.3 cm soft tissue lesion present within the contralateral right parotid gland (series 4, image 91). Upper chest: Hazy subsegmental atelectatic changes noted within the visualized lungs. Scattered small volume secretions noted within the mid esophageal lumen. Review of the MIP images confirms the above findings CTA HEAD FINDINGS Anterior circulation: Atheromatous irregularity within the carotid siphons without hemodynamically significant stenosis. 3 mm outpouching extending inferiorly from the supraclinoid right ICA felt to be most consistent with a vascular infundibulum related to a hypoplastic right PCOM. A1 segments patent bilaterally. Normal anterior communicating complex. Anterior cerebral arteries are patent without significant stenosis. Right M1 segment widely patent. Short-segment mild-to-moderate stenosis involving the mid left M1 segment noted (series 9, image 21). No proximal MCA branch occlusion or high-grade stenosis. Distal MCA branches are perfused and symmetric. Posterior circulation: Both V4 segments are tortuous but widely patent without stenosis. Both PICA patent at their origins. Basilar patent without stenosis. Superior cerebellar arteries patent bilaterally. Both PCAs  primarily supplied via the basilar. Right PCA widely patent to its distal aspect. Atheromatous irregularity and tortuosity about the proximal left ECA with associated moderate left P1/P2 stenoses (series 9, image 20). Left PCA otherwise patent to its distal aspect. Venous sinuses: Patent  allowing for timing the contrast bolus. Anatomic variants: None significant.  No aneurysm. Review of the MIP images confirms the above findings IMPRESSION: 1. Negative CTA for large vessel occlusion or other emergent finding. 2. Intracranial atherosclerotic disease with associated short-segment mild-to-moderate mid left M1 segment stenosis, with moderate proximal left PCA stenoses. 3. Diffuse tortuosity of the major arterial vasculature of the head and neck, suggesting chronic underlying hypertension. 4. Bilateral parotid lesions measuring up to 2 cm on the left, indeterminate. Nonemergent outpatient ENT referral for further workup and evaluation could be performed for further evaluation as warranted. The Electronically Signed   By: Rise Mu M.D.   On: 02/14/2023 22:22   CT Head Wo Contrast  Result Date: 02/14/2023 CLINICAL DATA:  Memory loss, shortness of breath EXAM: CT HEAD WITHOUT CONTRAST TECHNIQUE: Contiguous axial images were obtained from the base of the skull through the vertex without intravenous contrast. RADIATION DOSE REDUCTION: This exam was performed according to the departmental dose-optimization program which includes automated exposure control, adjustment of the mA and/or kV according to patient size and/or use of iterative reconstruction technique. COMPARISON:  None Available. FINDINGS: Brain: Periventricular white matter changes, likely the sequela of chronic small vessel ischemic disease. Additional hypodensity is noted throughout the bilateral basal ganglia and thalami, which is technically age indeterminate but may be the sequela of acute or subacute infarcts. No evidence of acute hemorrhage,  mass, mass effect, or midline shift. No hydrocephalus or extra-axial fluid collection. Cerebral volume is overall within normal limits for age. Vascular: No hyperdense vessel. Skull: Negative for fracture or focal lesion. Sinuses/Orbits: Mucosal thickening in the ethmoid air cells and sphenoid sinuses, with left sphenoid sinus air-fluid level. Status post bilateral lens replacements. Other: Small amount of fluid in the right mastoid air cells. IMPRESSION: 1. Hypodensity throughout the bilateral basal ganglia and thalami, which is technically age indeterminate and favored to be chronic but could be the sequela of acute or subacute infarcts. Correlate with symptoms and consider MRI if there is clinical concern for acute or subacute infarct. 2. Mucosal thickening in the ethmoid air cells and sphenoid sinuses, with left sphenoid sinus air-fluid level, which can be seen in the setting of acute sinusitis. Electronically Signed   By: Wiliam Ke M.D.   On: 02/14/2023 17:34   DG Chest 2 View  Result Date: 02/14/2023 CLINICAL DATA:  Hip surgery 3 weeks ago, decreased mental status, shortness of breath, cough EXAM: CHEST - 2 VIEW COMPARISON:  02/01/2023 FINDINGS: Frontal and lateral views of the chest demonstrate an unremarkable cardiac silhouette. There is left lower lobe consolidation, which could reflect pneumonia or aspiration. No significant pleural effusion. Right chest is clear. No pneumothorax. No acute bony abnormalities. IMPRESSION: 1. Left lower lobe airspace disease consistent with infection or aspiration. Electronically Signed   By: Sharlet Salina M.D.   On: 02/14/2023 17:04    EKG: I independently viewed the EKG done and my findings are as followed: Wide QRS at a rate of 69 bpm with Qtc  Assessment/Plan Present on Admission:  CAP (community acquired pneumonia)  Restless legs  Hypokalemia  Prolonged Q-T interval on ECG  CKD stage 3a, GFR 45-59 ml/min  Atrial fibrillation, chronic  Chronic  HFrEF (heart failure with reduced ejection fraction)  Hyperthyroidism  Principal Problem:   CAP (community acquired pneumonia) Active Problems:   Chronic HFrEF (heart failure with reduced ejection fraction)   Atrial fibrillation, chronic   Hyperthyroidism   CKD stage 3a, GFR 45-59 ml/min  Restless legs   Hypokalemia   Prolonged Q-T interval on ECG   Acute ischemic stroke   Generalized weakness   Ambulatory dysfunction  Community-acquired pneumonia Patient was started on vancomycin and aztreonam, we shall continue same at this time with plan to de-escalate/discontinue based on blood culture, sputum culture, urine Legionella, strep pneumo and procalcitonin Continue Tylenol as needed Continue Mucinex, incentive spirometry, flutter valve   Questionable acute/subacute ischemic infarcts CT of head was indeterminate and favored chronic infarcts, however, it was suspected of possibility of sequela of acute and subacute infarcts CT angiography of head and neck showed no LVO It does not appear that patient can have MRI here at [facility] due to recent hip replacement.  Consider CT of head 24 hours after last CT of head Patient will be admitted to telemetry unit  Echocardiogram done on 12/15/2022 showed LVEF of 30 to 35%.  No RWMA.  Mild LVH.  G1 DD.  Echocardiogram in the morning Continue fall precautions and neuro checks Lipid panel and hemoglobin A1c will be checked Continue PT/OT eval and treat Bedside swallow eval by nursing prior to diet Consider tele neurology consult status post imaging studies  Generalized weakness and ambulatory difficulty Patient has not been ambulating since he had left hip replacement Continue fall precaution Continue PT/OT eval and treat  Hypokalemia K+ 3.2, this will be replenished  Prolonged QT interval QTc Avoid QT prolonging drugs Magnesium level will be checked  Hypoalbuminemia possibly secondary to mild protein calorie malnutrition Albumin 2.7,  protein supplement will be provided  CKD stage IIIA Stable, renally adjust medications, avoid nephrotoxic agents/dehydration/hypotension  COPD Stable, DuoNebs currently held due to prolonged QT interval  Chronic atrial fibrillation Continue Coreg Amiodarone currently held due to prolonged QT interval  Chronic HFrEF Echo done in February 2024 showed LVEF of 30 to 35% Continue Coreg  Hyperthyroidism Continue methimazole  Restless leg syndrome Continue ropinirole   DVT prophylaxis: SCDs  Advance Care Planning: DNR  Consults: Teleneurology  Family Communication: Son-in-law at bedside (all questions answered to satisfaction)  Severity of Illness: The appropriate patient status for this patient is INPATIENT. Inpatient status is judged to be reasonable and necessary in order to provide the required intensity of service to ensure the patient's safety. The patient's presenting symptoms, physical exam findings, and initial radiographic and laboratory data in the context of their chronic comorbidities is felt to place them at high risk for further clinical deterioration. Furthermore, it is not anticipated that the patient will be medically stable for discharge from the hospital within 2 midnights of admission.   * I certify that at the point of admission it is my clinical judgment that the patient will require inpatient hospital care spanning beyond 2 midnights from the point of admission due to high intensity of service, high risk for further deterioration and high frequency of surveillance required.*  Author: Frankey Shown, DO 02/15/2023 8:06 AM  For on call review [email or web address].

## 2023-02-14 NOTE — ED Triage Notes (Signed)
Family reports hip surgery after a fall on 4/3 at this facility.  Family says pt is sleeping all the time and has never returned to his pre surgery mental status.  Pt saw cardiology for routine visit today and they recommended he follow up at ER.

## 2023-02-14 NOTE — Patient Instructions (Addendum)
You were seen today in the [place] office. Your cardiac status appears stable and EKG continues to show normal rhythm, but we are concerned about your somnolence and right upper extremity jerking. You were falling asleep multiple times during the conversation and your son-in-law also reported concern for worsening memory lately. We would recommend you be evaluated in the ER for causes of lethargy/somnolence.   Medication Instructions:  Your physician recommends that you continue on your current medications as directed. Please refer to the Current Medication list given to you today.   Labwork: None today  Testing/Procedures: None today  Follow-Up: 10 days at the [facility] office  Any Other Special Instructions Will Be Listed Below (If Applicable).   You will be evaluated in the Emergency Room today.  If you need a refill on your cardiac medications before your next appointment, please call your pharmacy.

## 2023-02-14 NOTE — ED Notes (Signed)
Patient transported to CT 

## 2023-02-15 ENCOUNTER — Encounter: Payer: Self-pay | Admitting: Physician Assistant

## 2023-02-15 ENCOUNTER — Inpatient Hospital Stay (HOSPITAL_COMMUNITY): Payer: Medicare Other

## 2023-02-15 DIAGNOSIS — R531 Weakness: Secondary | ICD-10-CM

## 2023-02-15 DIAGNOSIS — R4182 Altered mental status, unspecified: Secondary | ICD-10-CM

## 2023-02-15 DIAGNOSIS — R262 Difficulty in walking, not elsewhere classified: Secondary | ICD-10-CM | POA: Insufficient documentation

## 2023-02-15 DIAGNOSIS — I639 Cerebral infarction, unspecified: Secondary | ICD-10-CM | POA: Insufficient documentation

## 2023-02-15 LAB — HEMOGLOBIN A1C
Hgb A1c MFr Bld: 6.4 % — ABNORMAL HIGH (ref 4.8–5.6)
Mean Plasma Glucose: 136.98 mg/dL

## 2023-02-15 LAB — LIPID PANEL
Cholesterol: 124 mg/dL (ref 0–200)
HDL: 30 mg/dL — ABNORMAL LOW (ref >40)
LDL Cholesterol: 80 mg/dL (ref 0–99)
Total CHOL/HDL Ratio: 4.1 RATIO
Triglycerides: 69 mg/dL (ref <150)
VLDL: 14 mg/dL (ref 0–40)

## 2023-02-15 LAB — GLUCOSE, CAPILLARY: Glucose-Capillary: 85 mg/dL (ref 70–99)

## 2023-02-15 LAB — PROCALCITONIN: Procalcitonin: 0.13 ng/mL

## 2023-02-15 LAB — CULTURE, BLOOD (ROUTINE X 2): Special Requests: ADEQUATE

## 2023-02-15 MED ORDER — CARVEDILOL 3.125 MG PO TABS
3.1250 mg | ORAL_TABLET | Freq: Two times a day (BID) | ORAL | Status: DC
  Administered 2023-02-15 – 2023-02-18 (×6): 3.125 mg via ORAL
  Filled 2023-02-15 (×6): qty 1

## 2023-02-15 MED ORDER — GUAIFENESIN-DM 100-10 MG/5ML PO SYRP
5.0000 mL | ORAL_SOLUTION | ORAL | Status: DC | PRN
  Administered 2023-02-15: 5 mL via ORAL
  Filled 2023-02-15: qty 5

## 2023-02-15 MED ORDER — TAMSULOSIN HCL 0.4 MG PO CAPS: 0.4000 mg | ORAL_CAPSULE | Freq: Every day | ORAL | Status: DC

## 2023-02-15 MED ORDER — FLUTICASONE PROPIONATE 50 MCG/ACT NA SUSP
2.0000 | Freq: Every day | NASAL | Status: DC
  Administered 2023-02-16 – 2023-02-17 (×2): 2 via NASAL
  Filled 2023-02-15: qty 16

## 2023-02-15 MED ORDER — IPRATROPIUM-ALBUTEROL 0.5-2.5 (3) MG/3ML IN SOLN
3.0000 mL | RESPIRATORY_TRACT | Status: DC | PRN
  Administered 2023-02-15: 3 mL via RESPIRATORY_TRACT

## 2023-02-15 MED ORDER — POTASSIUM CHLORIDE CRYS ER 20 MEQ PO TBCR
40.0000 meq | EXTENDED_RELEASE_TABLET | Freq: Once | ORAL | Status: AC
  Administered 2023-02-15: 40 meq via ORAL
  Filled 2023-02-15: qty 2

## 2023-02-15 MED ORDER — TRAMADOL HCL 50 MG PO TABS
50.0000 mg | ORAL_TABLET | Freq: Two times a day (BID) | ORAL | Status: DC | PRN
  Administered 2023-02-16 – 2023-02-17 (×4): 50 mg via ORAL
  Filled 2023-02-15 (×5): qty 1

## 2023-02-15 MED ORDER — IPRATROPIUM-ALBUTEROL 0.5-2.5 (3) MG/3ML IN SOLN
3.0000 mL | Freq: Four times a day (QID) | RESPIRATORY_TRACT | Status: DC
  Administered 2023-02-15 – 2023-02-17 (×8): 3 mL via RESPIRATORY_TRACT
  Filled 2023-02-15 (×7): qty 3

## 2023-02-15 MED ORDER — PROCHLORPERAZINE EDISYLATE 10 MG/2ML IJ SOLN
10.0000 mg | Freq: Four times a day (QID) | INTRAMUSCULAR | Status: DC | PRN
  Administered 2023-02-15: 10 mg via INTRAVENOUS
  Filled 2023-02-15: qty 2

## 2023-02-15 MED ORDER — GLUCERNA SHAKE PO LIQD
237.0000 mL | Freq: Three times a day (TID) | ORAL | Status: DC
  Administered 2023-02-15 – 2023-02-18 (×8): 237 mL via ORAL
  Filled 2023-02-15 (×5): qty 237

## 2023-02-15 MED ORDER — CLONAZEPAM 0.5 MG PO TABS
0.5000 mg | ORAL_TABLET | Freq: Every evening | ORAL | Status: DC | PRN
  Administered 2023-02-17 (×2): 0.5 mg via ORAL
  Filled 2023-02-15 (×2): qty 1

## 2023-02-15 MED ORDER — FINASTERIDE 5 MG PO TABS
5.0000 mg | ORAL_TABLET | Freq: Every day | ORAL | Status: DC
  Administered 2023-02-15 – 2023-02-18 (×4): 5 mg via ORAL
  Filled 2023-02-15 (×4): qty 1

## 2023-02-15 MED ORDER — METHIMAZOLE 5 MG PO TABS
5.0000 mg | ORAL_TABLET | Freq: Every day | ORAL | Status: DC
  Administered 2023-02-15 – 2023-02-18 (×4): 5 mg via ORAL
  Filled 2023-02-15 (×4): qty 1

## 2023-02-15 MED ORDER — TAMSULOSIN HCL 0.4 MG PO CAPS
0.4000 mg | ORAL_CAPSULE | Freq: Two times a day (BID) | ORAL | Status: DC
  Administered 2023-02-15 – 2023-02-18 (×7): 0.4 mg via ORAL
  Filled 2023-02-15 (×7): qty 1

## 2023-02-15 MED ORDER — DM-GUAIFENESIN ER 30-600 MG PO TB12
1.0000 | ORAL_TABLET | Freq: Two times a day (BID) | ORAL | Status: DC
  Administered 2023-02-15 – 2023-02-18 (×7): 1 via ORAL
  Filled 2023-02-15 (×7): qty 1

## 2023-02-15 MED ORDER — SODIUM CHLORIDE 0.9 % IV SOLN
2.0000 g | Freq: Two times a day (BID) | INTRAVENOUS | Status: DC
  Administered 2023-02-15 – 2023-02-17 (×6): 2 g via INTRAVENOUS
  Filled 2023-02-15 (×6): qty 12.5

## 2023-02-15 MED ORDER — ACETAMINOPHEN 325 MG PO TABS
650.0000 mg | ORAL_TABLET | Freq: Four times a day (QID) | ORAL | Status: DC | PRN
  Administered 2023-02-15 – 2023-02-17 (×3): 650 mg via ORAL
  Filled 2023-02-15 (×4): qty 2

## 2023-02-15 MED ORDER — ROPINIROLE HCL 1 MG PO TABS
2.0000 mg | ORAL_TABLET | Freq: Two times a day (BID) | ORAL | Status: DC
  Administered 2023-02-15: 1 mg via ORAL
  Administered 2023-02-15 – 2023-02-17 (×5): 2 mg via ORAL
  Filled 2023-02-15 (×6): qty 2

## 2023-02-15 NOTE — Plan of Care (Signed)
  Problem: Acute Rehab PT Goals(only PT should resolve) Goal: Pt Will Go Supine/Side To Sit Outcome: Progressing Flowsheets (Taken 02/15/2023 1513) Pt will go Supine/Side to Sit:  with minimal assist  with moderate assist Goal: Patient Will Transfer Sit To/From Stand Outcome: Progressing Flowsheets (Taken 02/15/2023 1513) Patient will transfer sit to/from stand:  with minimal assist  with moderate assist Goal: Pt Will Transfer Bed To Chair/Chair To Bed Outcome: Progressing Flowsheets (Taken 02/15/2023 1513) Pt will Transfer Bed to Chair/Chair to Bed:  with min assist  with mod assist Goal: Pt Will Ambulate Outcome: Progressing Flowsheets (Taken 02/15/2023 1513) Pt will Ambulate:  15 feet  with minimal assist  with moderate assist  with rolling walker   3:13 PM, 02/15/23 Gerald Hall, MPT Physical Therapist with [facility] [phone] office [phone] mobile phone

## 2023-02-15 NOTE — [place] FL2 (Signed)
[place] MEDICAID FL2 LEVEL OF CARE FORM     IDENTIFICATION  Patient Name: Gerald Hall Birthdate: Aug 01, 1935 Sex: male Admission Date (Current Location): 02/14/2023  [facility] and [place] Number:  [employer] and Address:  [facility],  [address] [place] [address]      Provider Number: [id number]  Attending Physician Name and Address:  Joseph Art, DO  Relative Name and Phone Number:       Current Level of Care: Hospital Recommended Level of Care: Skilled Nursing Facility Prior Approval Number:    Date Approved/Denied:   PASRR Number: [id number]  Discharge Plan: SNF    Current Diagnoses: Patient Active Problem List   Diagnosis Date Noted   Acute ischemic stroke 02/15/2023   Generalized weakness 02/15/2023   Ambulatory dysfunction 02/15/2023   CAP (community acquired pneumonia) 02/14/2023   Acute metabolic encephalopathy 02/04/2023   Intractable nausea and vomiting 02/04/2023   Class 1 obesity 02/04/2023   Chronic HFrEF (heart failure with reduced ejection fraction) 01/28/2023   Prolonged Q-T interval on ECG 01/28/2023   Closed displaced fracture of left femoral neck 01/26/2023   CKD stage 3a, GFR 45-59 ml/min 01/26/2023   Chronic respiratory failure with hypoxia 01/26/2023   Reduced ejection fraction concurrent with and due to chronic heart failure 06/23/2022   Aortic atherosclerosis 06/23/2022   Hyperthyroidism 03/04/2022   COPD (chronic obstructive pulmonary disease) 11/22/2021   COPD with acute exacerbation 11/20/2021   Hypokalemia 11/20/2021   Acute left-sided low back pain without sciatica 10/15/2021   Centrilobular emphysema 11/12/2020   Secondary cardiomyopathy 11/12/2020   Subacute thyroiditis 09/25/2020   Brow ptosis, bilateral 07/03/2020   Dermatochalasis of both upper eyelids 07/03/2020   Ptosis, both eyelids 07/03/2020   Impacted cerumen of right ear 03/06/2020   Sensorineural hearing loss (SNHL) of right ear  with restricted hearing of left ear 03/06/2020   Atrial fibrillation, chronic 02/24/2019   Primary osteoarthritis involving multiple joints 02/21/2017   Current smoker 02/21/2017   AMD (age-related macular degeneration), bilateral 11/10/2016   Insomnia 07/20/2016   Restless legs 07/20/2016   Spinal stenosis in cervical region 10/14/2015   Senile purpura 04/18/2015   Osteopenia 01/28/2015   Spinal stenosis of lumbar region 01/20/2015   BPH (benign prostatic hyperplasia) 01/20/2015   HTN (hypertension), benign 04/17/2014   Lumbar disc disease 03/05/2014   Basal cell carcinoma of face 11/08/2011    Orientation RESPIRATION BLADDER Height & Weight     Self, Time, Situation, Place  O2 (2L) Incontinent Weight: 210 lb (95.3 kg) Height:     BEHAVIORAL SYMPTOMS/MOOD NEUROLOGICAL BOWEL NUTRITION STATUS      Continent Diet (see dc summaryu)  AMBULATORY STATUS COMMUNICATION OF NEEDS Skin   Extensive Assist Verbally                         Personal Care Assistance Level of Assistance    Bathing Assistance: Maximum assistance Feeding assistance: Limited assistance Dressing Assistance: Maximum assistance     Functional Limitations Info    Sight Info: Impaired Hearing Info: Impaired Speech Info: Adequate    SPECIAL CARE FACTORS FREQUENCY  PT (By licensed PT), OT (By licensed OT)     PT Frequency: 5x week OT Frequency: 3x week            Contractures Contractures Info: Not present    Additional Factors Info    Code Status Info: DNR Allergies Info: Cephalexin, Penicillins Psychotropic Info: Klonopin  Current Medications (02/15/2023):  This is the current hospital active medication list Current Facility-Administered Medications  Medication Dose Route Frequency Provider Last Rate Last Admin   acetaminophen (TYLENOL) tablet 650 mg  650 mg Oral Q6H PRN Adefeso, Oladapo, DO   650 mg at 02/15/23 0030   carvedilol (COREG) tablet 3.125 mg  3.125 mg Oral BID WC  Adefeso, Oladapo, DO   3.125 mg at 02/15/23 1047   ceFEPIme (MAXIPIME) 2 g in sodium chloride 0.9 % 100 mL IVPB  2 g Intravenous Q12H Earnie Larsson, RPH 200 mL/hr at 02/15/23 1058 2 g at 02/15/23 1058   clonazePAM (KLONOPIN) tablet 0.5 mg  0.5 mg Oral QHS PRN Joseph Art, DO       dextromethorphan-guaiFENesin (MUCINEX DM) 30-600 MG per 12 hr tablet 1 tablet  1 tablet Oral BID Adefeso, Oladapo, DO   1 tablet at 02/15/23 1048   feeding supplement (GLUCERNA SHAKE) (GLUCERNA SHAKE) liquid 237 mL  237 mL Oral TID BM Adefeso, Oladapo, DO   237 mL at 02/15/23 1048   finasteride (PROSCAR) tablet 5 mg  5 mg Oral Daily Adefeso, Oladapo, DO   5 mg at 02/15/23 1048   fluticasone (FLONASE) 50 MCG/ACT nasal spray 2 spray  2 spray Each Nare QHS Vann, Jessica U, DO       guaiFENesin-dextromethorphan (ROBITUSSIN DM) 100-10 MG/5ML syrup 5 mL  5 mL Oral Q4H PRN Adefeso, Oladapo, DO   5 mL at 02/15/23 0813   ipratropium-albuterol (DUONEB) 0.5-2.5 (3) MG/3ML nebulizer solution 3 mL  3 mL Nebulization Q4H PRN Marlin Canary U, DO   3 mL at 02/15/23 1451   methimazole (TAPAZOLE) tablet 5 mg  5 mg Oral Daily Adefeso, Oladapo, DO   5 mg at 02/15/23 1048   prochlorperazine (COMPAZINE) injection 10 mg  10 mg Intravenous Q6H PRN Adefeso, Oladapo, DO       rOPINIRole (REQUIP) tablet 2 mg  2 mg Oral BID Adefeso, Oladapo, DO   2 mg at 02/15/23 1048   tamsulosin (FLOMAX) capsule 0.4 mg  0.4 mg Oral BID Adefeso, Oladapo, DO   0.4 mg at 02/15/23 1048   traMADol (ULTRAM) tablet 50 mg  50 mg Oral Q12H PRN Vann, Jessica U, DO       vancomycin (VANCOREADY) IVPB 750 mg/150 mL  750 mg Intravenous Q24H Adefeso, Oladapo, DO         Discharge Medications: Please see discharge summary for a list of discharge medications.  Relevant Imaging Results:  Relevant Lab Results:   Additional Information SSN: [id number]  Elliot Gault, LCSW

## 2023-02-15 NOTE — Progress Notes (Addendum)
PROGRESS NOTE    Gerald Hall  TKZ:[id number] DOB: September 10, 1935 DOA: 02/14/2023 PCP: Babs Sciara, MD    Brief Narrative: Gerald Hall is an 88 y.o. male with medical history significant of COPD on supplemental oxygen via [place] at 2 LPM at baseline, atrial fibrillation, CKD 3, BPH, HFrEF (echo done in February 2024 showed LVEF of 30 to 35%) who presents to the emergency department from cardiology office due to being somnolent during the office visit. Patient was admitted from 4/3-4/12 due to closed displaced fracture of the left femoral neck s/p left hip hemiarthroplasty and was discharged to a SNF.  Most of the history was obtained from son-in-law at bedside.  Per report, patient has not been able to return to his baseline of functioning since the surgery.  He used to be able to to take care of some of his ADLs, but now requires maximal assist to do almost anything, his mental cognition was also said to have declined and family was worried about possible stroke.  Patient was also reported not to have been able to stand or ambulate since the surgery (different from baseline).  He has several days of nonproductive cough with chest congestion.    Assessment and Plan: Pneumonia- HCAP vs aspiration -IV abx Continue Tylenol as needed Continue Mucinex, incentive spirometry, flutter valve  -SLP eval   Chronic respiratory failure -wears 2L chronically  Questionable acute/subacute ischemic infarcts CT of head was indeterminate and favored chronic infarcts, however, it was suspected of possibility of sequela of acute and subacute infarcts CT angiography of head and neck showed no LVO Discussed with MRI and ortho-- ok to have MRI brain Echocardiogram done on 12/15/2022 showed LVEF of 30 to 35%.  No RWMA.  Mild LVH.  G1 DD.  -- hold on repeating for now Continue fall precautions and neuro checks -hemoglobin A1c -LDL: 80 Continue PT/OT eval and treat  Generalized weakness and ambulatory  difficulty Patient has not been ambulating since he had left hip replacement Continue fall precaution Continue PT/OT eval and treat-- from SNF and suspect he will need to return   Hypokalemia -replete   Prolonged QT interval Avoid QT prolonging drugs Magnesium level  -EKG in AM   Hypoalbuminemia possibly secondary to mild protein calorie malnutrition Albumin 2.7, protein supplement will be provided   CKD stage IIIA Stable, renally adjust medications, avoid nephrotoxic agents/dehydration/hypotension   COPD Stable, DuoNebs currently held due to prolonged QT interval   Chronic atrial fibrillation Continue Coreg Amiodarone currently held due to prolonged QT interval (would resume at 100 daily when able -- discussed with cardiology) EKG in AM   Chronic HFrEF Echo done in February 2024 showed LVEF of 30 to 35% Continue Coreg   Hyperthyroidism -Continue methimazole TSH in AM   Restless leg syndrome -Continue ropinirole  Obesity Estimated body mass index is 31.93 kg/m as calculated from the following:   Height as of an earlier encounter on 02/14/23:  (1.727 m).   Weight as of this encounter: 95.3 kg.     Addendum -MRI negative -holding lyrica (has been on 2 months)- wonder if contributing to symptoms   DVT prophylaxis: SCDs Start: 02/15/23 0700    Code Status: DNR Family Communication: daughter at bedside  Disposition Plan:  Level of care: Med-Surg Status is: Inpatient Remains inpatient appropriate because: needs MRI and IV abx  TOC consult for SNF placement  Consultants:  none   Subjective: Daughter says he is the most awake he has  ever been since d/c prior  Objective: Vitals:   02/15/23 0700 02/15/23 0730 02/15/23 0800 02/15/23 0830  BP: (!) 124/56  129/61 139/60  Pulse: 71 69 71 70  Resp:  (!) 26 18 20   Temp:    98.6 F (37 C)  TempSrc:    Oral  SpO2: 94% 95% 97% 98%  Weight:       No intake or output data in the 24 hours ending 02/15/23  0913 Filed Weights   02/14/23 1801  Weight: 95.3 kg    Examination:   General: Appearance:    Obese male in no acute distress     Lungs:     Diminished at b/l bases, respirations unlabored-- wears 2L O2 24/7  Heart:    Normal heart rate.    MS:   All extremities are intact.    Neurologic:   Awake, alert, pleasant and cooperative, appropriate        Data Reviewed: I have personally reviewed following labs and imaging studies  CBC: Recent Labs  Lab 02/14/23 1842  WBC 12.0*  NEUTROABS 8.7*  HGB 12.1*  HCT 39.0  MCV 98.7  PLT 263   Basic Metabolic Panel: Recent Labs  Lab 02/14/23 1842  NA 141  K 3.2*  CL 100  CO2 31  GLUCOSE 107*  BUN 32*  CREATININE 1.52*  CALCIUM 9.7   GFR: Estimated Creatinine Clearance: 39.8 mL/min (A) (by C-G formula based on SCr of 1.52 mg/dL (H)). Liver Function Tests: Recent Labs  Lab 02/14/23 1842  AST 13*  ALT 17  ALKPHOS 94  BILITOT 0.7  PROT 7.3  ALBUMIN 2.7*   No results for input(s): "LIPASE", "AMYLASE" in the last 168 hours. Recent Labs  Lab 02/14/23 1842  AMMONIA <10   Coagulation Profile: No results for input(s): "INR", "PROTIME" in the last 168 hours. Cardiac Enzymes: No results for input(s): "CKTOTAL", "CKMB", "CKMBINDEX", "TROPONINI" in the last 168 hours. BNP (last 3 results) No results for input(s): "PROBNP" in the last 8760 hours. HbA1C: No results for input(s): "HGBA1C" in the last 72 hours. CBG: No results for input(s): "GLUCAP" in the last 168 hours. Lipid Profile: Recent Labs    02/15/23 0750  CHOL 124  HDL 30*  LDLCALC 80  TRIG 69  CHOLHDL 4.1   Thyroid Function Tests: No results for input(s): "TSH", "T4TOTAL", "FREET4", "T3FREE", "THYROIDAB" in the last 72 hours. Anemia Panel: No results for input(s): "VITAMINB12", "FOLATE", "FERRITIN", "TIBC", "IRON", "RETICCTPCT" in the last 72 hours. Sepsis Labs: Recent Labs  Lab 02/14/23 2127 02/15/23 0750  PROCALCITON  --  0.13  LATICACIDVEN  0.9  --     Recent Results (from the past 240 hour(s))  Blood culture (routine x 2)     Status: None (Preliminary result)   Collection Time: 02/14/23  9:27 PM   Specimen: BLOOD  Result Value Ref Range Status   Specimen Description BLOOD RIGHT ANTECUBITAL  Final   Special Requests   Final    BOTTLES DRAWN AEROBIC AND ANAEROBIC Blood Culture adequate volume   Culture   Final    NO GROWTH < 12 HOURS Performed at [facility], [address], [place], [place] [address]    Report Status PENDING  Incomplete  Blood culture (routine x 2)     Status: None (Preliminary result)   Collection Time: 02/14/23  9:28 PM   Specimen: BLOOD  Result Value Ref Range Status   Specimen Description BLOOD BLOOD RIGHT HAND  Final   Special  Requests   Final    BOTTLES DRAWN AEROBIC AND ANAEROBIC Blood Culture adequate volume   Culture   Final    NO GROWTH < 12 HOURS Performed at [facility], [address], [place], [place] [address]    Report Status PENDING  Incomplete         Radiology Studies: CT ANGIO HEAD NECK W WO CM  Result Date: 02/14/2023 CLINICAL DATA:  Initial evaluation for neuro deficit, stroke. EXAM: CT ANGIOGRAPHY HEAD AND NECK WITH AND WITHOUT CONTRAST TECHNIQUE: Multidetector CT imaging of the head and neck was performed using the standard protocol during bolus administration of intravenous contrast. Multiplanar CT image reconstructions and MIPs were obtained to evaluate the vascular anatomy. Carotid stenosis measurements (when applicable) are obtained utilizing NASCET criteria, using the distal internal carotid diameter as the denominator. RADIATION DOSE REDUCTION: This exam was performed according to the departmental dose-optimization program which includes automated exposure control, adjustment of the mA and/or kV according to patient size and/or use of iterative reconstruction technique. CONTRAST:  75mL OMNIPAQUE IOHEXOL 350 MG/ML SOLN COMPARISON:  CT from earlier the same day. FINDINGS:  CTA NECK FINDINGS Aortic arch: Visualized aortic arch normal in caliber with standard branch pattern. Atheromatous change about the arch itself. No stenosis about the origin of the great vessels. Right carotid system: Right common and internal carotid arteries are tortuous without dissection. Mild atheromatous change about the right carotid bulb without hemodynamically significant greater than 50% stenosis. Left carotid system: Left common and internal carotid arteries are tortuous without evidence for dissection. Mild atheromatous change without hemodynamically significant stenosis. Vertebral arteries: Both vertebral arteries arise from subclavian arteries. No proximal subclavian artery stenosis. Vertebral arteries are patent without stenosis or dissection. Skeleton: No worrisome osseous lesions. Prior ACDF at C4-C5. Advanced spondylosis at C5-6 and C6-7. Large geode formation noted at the dens. Degenerative changes noted about the right TMJ. Other neck: No other acute soft tissue abnormality within the neck. 2 cm soft tissue mass present at the inferior left parotid gland (series 4, image 80), indeterminate. Similar 1.3 cm soft tissue lesion present within the contralateral right parotid gland (series 4, image 91). Upper chest: Hazy subsegmental atelectatic changes noted within the visualized lungs. Scattered small volume secretions noted within the mid esophageal lumen. Review of the MIP images confirms the above findings CTA HEAD FINDINGS Anterior circulation: Atheromatous irregularity within the carotid siphons without hemodynamically significant stenosis. 3 mm outpouching extending inferiorly from the supraclinoid right ICA felt to be most consistent with a vascular infundibulum related to a hypoplastic right PCOM. A1 segments patent bilaterally. Normal anterior communicating complex. Anterior cerebral arteries are patent without significant stenosis. Right M1 segment widely patent. Short-segment  mild-to-moderate stenosis involving the mid left M1 segment noted (series 9, image 21). No proximal MCA branch occlusion or high-grade stenosis. Distal MCA branches are perfused and symmetric. Posterior circulation: Both V4 segments are tortuous but widely patent without stenosis. Both PICA patent at their origins. Basilar patent without stenosis. Superior cerebellar arteries patent bilaterally. Both PCAs primarily supplied via the basilar. Right PCA widely patent to its distal aspect. Atheromatous irregularity and tortuosity about the proximal left ECA with associated moderate left P1/P2 stenoses (series 9, image 20). Left PCA otherwise patent to its distal aspect. Venous sinuses: Patent allowing for timing the contrast bolus. Anatomic variants: None significant.  No aneurysm. Review of the MIP images confirms the above findings IMPRESSION: 1. Negative CTA for large vessel occlusion or other emergent finding. 2. Intracranial atherosclerotic  disease with associated short-segment mild-to-moderate mid left M1 segment stenosis, with moderate proximal left PCA stenoses. 3. Diffuse tortuosity of the major arterial vasculature of the head and neck, suggesting chronic underlying hypertension. 4. Bilateral parotid lesions measuring up to 2 cm on the left, indeterminate. Nonemergent outpatient ENT referral for further workup and evaluation could be performed for further evaluation as warranted. The Electronically Signed   By: Rise Mu M.D.   On: 02/14/2023 22:22   CT Head Wo Contrast  Result Date: 02/14/2023 CLINICAL DATA:  Memory loss, shortness of breath EXAM: CT HEAD WITHOUT CONTRAST TECHNIQUE: Contiguous axial images were obtained from the base of the skull through the vertex without intravenous contrast. RADIATION DOSE REDUCTION: This exam was performed according to the departmental dose-optimization program which includes automated exposure control, adjustment of the mA and/or kV according to patient  size and/or use of iterative reconstruction technique. COMPARISON:  None Available. FINDINGS: Brain: Periventricular white matter changes, likely the sequela of chronic small vessel ischemic disease. Additional hypodensity is noted throughout the bilateral basal ganglia and thalami, which is technically age indeterminate but may be the sequela of acute or subacute infarcts. No evidence of acute hemorrhage, mass, mass effect, or midline shift. No hydrocephalus or extra-axial fluid collection. Cerebral volume is overall within normal limits for age. Vascular: No hyperdense vessel. Skull: Negative for fracture or focal lesion. Sinuses/Orbits: Mucosal thickening in the ethmoid air cells and sphenoid sinuses, with left sphenoid sinus air-fluid level. Status post bilateral lens replacements. Other: Small amount of fluid in the right mastoid air cells. IMPRESSION: 1. Hypodensity throughout the bilateral basal ganglia and thalami, which is technically age indeterminate and favored to be chronic but could be the sequela of acute or subacute infarcts. Correlate with symptoms and consider MRI if there is clinical concern for acute or subacute infarct. 2. Mucosal thickening in the ethmoid air cells and sphenoid sinuses, with left sphenoid sinus air-fluid level, which can be seen in the setting of acute sinusitis. Electronically Signed   By: Wiliam Ke M.D.   On: 02/14/2023 17:34   DG Chest 2 View  Result Date: 02/14/2023 CLINICAL DATA:  Hip surgery 3 weeks ago, decreased mental status, shortness of breath, cough EXAM: CHEST - 2 VIEW COMPARISON:  02/01/2023 FINDINGS: Frontal and lateral views of the chest demonstrate an unremarkable cardiac silhouette. There is left lower lobe consolidation, which could reflect pneumonia or aspiration. No significant pleural effusion. Right chest is clear. No pneumothorax. No acute bony abnormalities. IMPRESSION: 1. Left lower lobe airspace disease consistent with infection or aspiration.  Electronically Signed   By: Sharlet Salina M.D.   On: 02/14/2023 17:04        Scheduled Meds:  carvedilol  3.125 mg Oral BID WC   dextromethorphan-guaiFENesin  1 tablet Oral BID   feeding supplement (GLUCERNA SHAKE)  237 mL Oral TID BM   finasteride  5 mg Oral Daily   methimazole  5 mg Oral Daily   rOPINIRole  2 mg Oral BID   tamsulosin  0.4 mg Oral BID   Continuous Infusions:  ceFEPime (MAXIPIME) IV     vancomycin       LOS: 1 day    Time spent: 45 minutes spent on chart review, discussion with nursing staff, consultants, updating family and interview/physical exam; more than 50% of that time was spent in counseling and/or coordination of care.    Joseph Art, DO [facility] Available via Epic secure chat 7am-7pm After these hours, please  refer to coverage provider listed on [email or web address] 02/15/2023, 9:13 AM

## 2023-02-15 NOTE — Evaluation (Addendum)
Occupational Therapy Evaluation Patient Details Name: Gerald Hall MRN: [id number] DOB: Oct 08, 1935 Today's Date: 02/15/2023   History of Present Illness Gerald Hall is an 88 y.o. male with medical history significant of COPD on supplemental oxygen via [place] at 2 LPM at baseline, atrial fibrillation, CKD 3, BPH, HFrEF (echo done in February 2024 showed LVEF of 30 to 35%) who presents to the emergency department from cardiology office due to being somnolent during the office visit.  Patient was admitted from 4/32/12 due to closed displaced fracture of the left femoral neck s/p left hip hemiarthroplasty and was discharged to a SNF.  Most of the history was obtained from son-in-law at bedside.  Per report, patient has not been able to return to his baseline of functioning since the surgery.  He used to be able to to take care of some of his ADLs, but now requires maximal assist to do almost anything, his mental cognition was also said to have declined and family was worried about possible stroke.  Patient was also reported not to have been able to stand or ambulate since the surgery (different from baseline).  He has several days of nonproductive cough with chest congestion.   Clinical Impression   Pt agreeable to OT and PT co-evaluation. Pt presented with mild pain and need for mod A to sit at EOB. Mod A also needed for step pivot to chair with RW, but pt was able to take a couple steps to and from chair before sitting with use of RW and assist. General weakness noted in B UE. Possible mild increase in weakness to L side shoulder flexion but nothing significant. Assist needed for lower body ADL tasks but good A/ROM for upper body ADL's other than possible mild L elbow extension limitation. Pt will benefit from continued OT in the hospital and recommended venue below to increase strength, balance, and endurance for safe ADL's.        Recommendations for follow up therapy are one component of a  multi-disciplinary discharge planning process, led by the attending physician.  Recommendations may be updated based on patient status, additional functional criteria and insurance authorization.   Assistance Recommended at Discharge Frequent or constant Supervision/Assistance  Patient can return home with the following A lot of help with bathing/dressing/bathroom;Assistance with cooking/housework;Assist for transportation;Help with stairs or ramp for entrance;A lot of help with walking and/or transfers    Functional Status Assessment  Patient has had a recent decline in their functional status and demonstrates the ability to make significant improvements in function in a reasonable and predictable amount of time.  Equipment Recommendations  None recommended by OT    Recommendations for Other Services       Precautions / Restrictions Precautions Precautions: Fall Precaution Comments: Posterior hip precautions. Restrictions Weight Bearing Restrictions: Yes LLE Weight Bearing: Weight bearing as tolerated      Mobility Bed Mobility Overal bed mobility: Needs Assistance Bed Mobility: Supine to Sit     Supine to sit: Mod assist     General bed mobility comments: increased time, labored movement    Transfers Overall transfer level: Needs assistance Equipment used: Rolling walker (2 wheels) Transfers: Sit to/from Stand, Bed to chair/wheelchair/BSC Sit to Stand: Mod assist     Step pivot transfers: Mod assist     General transfer comment: Unsteady ; increased time      Balance Overall balance assessment: Needs assistance Sitting-balance support: Feet supported, No upper extremity supported Sitting balance-Leahy Scale: Fair Sitting  balance - Comments: fair to good   Standing balance support: Reliant on assistive device for balance, During functional activity, Bilateral upper extremity supported Standing balance-Leahy Scale: Poor Standing balance comment: poor to fair  using RW                           ADL either performed or assessed with clinical judgement   ADL Overall ADL's : Needs assistance/impaired     Grooming: Min guard;Set up;Sitting   Upper Body Bathing: Min guard;Minimal assistance;Sitting   Lower Body Bathing: Maximal assistance;Total assistance;Sitting/lateral leans   Upper Body Dressing : Min guard;Minimal assistance   Lower Body Dressing: Maximal assistance;Total assistance;Bed level Lower Body Dressing Details (indicate cue type and reason): Assisted to don socks supine in bed today. Toilet Transfer: Moderate assistance;Stand-pivot;Rolling walker (2 wheels) Toilet Transfer Details (indicate cue type and reason): simulated via EOB to chair transfer with RW Toileting- Clothing Manipulation and Hygiene: Maximal assistance;Total assistance;Bed level       Functional mobility during ADLs: Moderate assistance;Rolling walker (2 wheels) General ADL Comments: Pt able to take a couple steps forward and backward from chair with RW.     Vision Baseline Vision/History: 1 Wears glasses Ability to See in Adequate Light: 1 Impaired Patient Visual Report: No change from baseline Vision Assessment?: No apparent visual deficits                Pertinent Vitals/Pain Pain Assessment Pain Assessment: Faces Faces Pain Scale: Hurts a little bit Pain Location: L hip Pain Descriptors / Indicators: Discomfort Pain Intervention(s): Limited activity within patient's tolerance, Monitored during session, Repositioned     Hand Dominance Right   Extremity/Trunk Assessment Upper Extremity Assessment Upper Extremity Assessment: Generalized weakness   Lower Extremity Assessment Lower Extremity Assessment: Defer to PT evaluation   Cervical / Trunk Assessment Cervical / Trunk Assessment: Kyphotic   Communication Communication Communication: HOH;Deaf   Cognition Arousal/Alertness: Awake/alert Behavior During Therapy: WFL for  tasks assessed/performed Overall Cognitive Status: Within Functional Limits for tasks assessed                                                        Home Living Family/patient expects to be discharged to:: Skilled nursing facility Living Arrangements: Children                                      Prior Functioning/Environment Prior Level of Function : Needs assist       Physical Assist : Mobility (physical);ADLs (physical) Mobility (physical): Transfers;Gait;Stairs;Bed mobility ADLs (physical): Grooming;Bathing;Toileting;Dressing;IADLs Mobility Comments: Pt coming from SNF where he needed much assist for ambulation of ~5 feet with RW per daughter's report. ADLs Comments: Assisted by SNF staff.        OT Problem List: Decreased strength;Decreased range of motion;Decreased activity tolerance;Impaired balance (sitting and/or standing);Decreased safety awareness      OT Treatment/Interventions: Self-care/ADL training;Therapeutic exercise;DME and/or AE instruction;Therapeutic activities;Patient/family education;Balance training    OT Goals(Current goals can be found in the care plan section) Acute Rehab OT Goals Patient Stated Goal: Return to SNF; improve function. OT Goal Formulation: With patient/family Time For Goal Achievement: 03/01/23 Potential to Achieve Goals: Good  OT Frequency: Min 2X/week  Co-evaluation PT/OT/SLP Co-Evaluation/Treatment: Yes Reason for Co-Treatment: To address functional/ADL transfers   OT goals addressed during session: ADL's and self-care                       End of Session Equipment Utilized During Treatment: Rolling walker (2 wheels);Oxygen  Activity Tolerance: Patient tolerated treatment well Patient left: in chair;with call bell/phone within reach;with family/visitor present  OT Visit Diagnosis: Unsteadiness on feet (R26.81);Other abnormalities of gait and mobility (R26.89);Muscle weakness  (generalized) (M62.81)                Time: 5784-6962 OT Time Calculation (min): 16 min Charges:  OT General Charges $OT Visit: 1 Visit OT Evaluation $OT Eval Low Complexity: 1 Low  Cristofer Yaffe OT, MOT  Danie Chandler 02/15/2023, 10:09 AM

## 2023-02-15 NOTE — Progress Notes (Signed)
Upon this RN's assessment patient is sleeping comfortably. Pt was was awaken by touvh and voice. He then goes right back to sleep. It was reported in reported that patient had not rested well and had several bout of diarrhea. Personal sitter at bedside. Vital signs were obtained and are WNL. Gerald Hall

## 2023-02-15 NOTE — ED Notes (Signed)
Pt had a BM. Pt cleaned, new linen, new depends, and male purewick placed.

## 2023-02-15 NOTE — Progress Notes (Signed)
Chart followed up to re-eval ER outcome. Subsequently admitted to the hospital with abnormal CXR raising question of PNA and abnormal head CT with ? Subacute vs acute CVA. Home med rec not yet performed so relayed to pharmD to help assist with getting this done for the hospital team to review. Our team presently does not appear to need to follow him in-house but relayed to Dr. Benjamine Mola that if telemetry stable, would favor continuing amiodarone at the lower  daily dose as intended during last hospitalization. Patient has not been on telemetry since admission so they will begin following. Dr. Benjamine Mola aware to reach out if we can be of assistance during his stay. Otherwise patient has f/u appt arranged with [place].

## 2023-02-15 NOTE — TOC Initial Note (Signed)
Transition of Care (TOC) - Initial/Assessment Note    Patient Details  Name: Gerald Hall MRN: [id number] Date of Birth: 31-Jul-1935  Transition of Care [facility]) CM/SW Contact:    Elliot Gault, LCSW Phone Number: 02/15/2023, 3:42 PM  Clinical Narrative:                  Pt admitted from [facility]. Pt known to TOC from recent admission to [facility].  Spoke with pt's daughter, Harriett Sine, today and reviewed dc planning. Anticipating pt will return to rehab setting at dc. TOC did refer pt out to 4 alternative snfs at the request of family. Awaiting decision. Per Harriett Sine, they are agreeable for pt to return to [facility] if another facility is not able to accept. Harriett Sine states that they are trying to find a facility closer to family members in [place] area.  TOC will follow.  Expected Discharge Plan: Skilled Nursing Facility Barriers to Discharge: Continued Medical Work up   Patient Goals and CMS Choice Patient states their goals for this hospitalization and ongoing recovery are:: continue rehab CMS Medicare.gov Compare Post Acute Care list provided to:: Patient Represenative (must comment) Choice offered to / list presented to : Adult Children      Expected Discharge Plan and Services In-house Referral: Clinical Social Work   Post Acute Care Choice: Skilled Nursing Facility                                        Prior Living Arrangements/Services   Lives with:: Other (Comment) (caregiver) Patient language and need for interpreter reviewed:: Yes        Need for Family Participation in Patient Care: Yes (Comment) Care giver support system in place?: Yes (comment)   Criminal Activity/Legal Involvement Pertinent to Current Situation/Hospitalization: No - Comment as needed  Activities of Daily Living Home Assistive Devices/Equipment: Bedside commode/3-in-1, Dentures (specify type), Eyeglasses, Hospital bed, Oxygen, Walker (specify type),  Wheelchair ADL Screening (condition at time of admission) Patient's cognitive ability adequate to safely complete daily activities?: Yes Is the patient deaf or have difficulty hearing?: Yes Does the patient have difficulty seeing, even when wearing glasses/contacts?: No Does the patient have difficulty concentrating, remembering, or making decisions?: Yes Patient able to express need for assistance with ADLs?: Yes Does the patient have difficulty dressing or bathing?: Yes Independently performs ADLs?: No Communication: Independent Dressing (OT): Needs assistance Is this a change from baseline?: Pre-admission baseline Grooming: Needs assistance Is this a change from baseline?: Pre-admission baseline Feeding: Independent Bathing: Dependent Is this a change from baseline?: Pre-admission baseline Toileting: Dependent Is this a change from baseline?: Pre-admission baseline In/Out Bed: Dependent Is this a change from baseline?: Pre-admission baseline Walks in Home: Dependent Is this a change from baseline?: Pre-admission baseline Does the patient have difficulty walking or climbing stairs?: Yes Weakness of Legs: Both Weakness of Arms/Hands: Both  Permission Sought/Granted Permission sought to share information with : Oceanographer granted to share information with : Yes, Verbal Permission Granted     Permission granted to share info w AGENCY: snfs        Emotional Assessment       Orientation: : Oriented to Self, Oriented to Place, Oriented to  Time, Oriented to Situation Alcohol / Substance Use: Not Applicable Psych Involvement: No (comment)  Admission diagnosis:  Hospital-acquired pneumonia [J18.9, Y95] CAP (community acquired  pneumonia) [J18.9] Patient Active Problem List   Diagnosis Date Noted   Acute ischemic stroke 02/15/2023   Generalized weakness 02/15/2023   Ambulatory dysfunction 02/15/2023   CAP (community acquired pneumonia)  02/14/2023   Acute metabolic encephalopathy 02/04/2023   Intractable nausea and vomiting 02/04/2023   Class 1 obesity 02/04/2023   Chronic HFrEF (heart failure with reduced ejection fraction) 01/28/2023   Prolonged Q-T interval on ECG 01/28/2023   Closed displaced fracture of left femoral neck 01/26/2023   CKD stage 3a, GFR 45-59 ml/min 01/26/2023   Chronic respiratory failure with hypoxia 01/26/2023   Reduced ejection fraction concurrent with and due to chronic heart failure 06/23/2022   Aortic atherosclerosis 06/23/2022   Hyperthyroidism 03/04/2022   COPD (chronic obstructive pulmonary disease) 11/22/2021   COPD with acute exacerbation 11/20/2021   Hypokalemia 11/20/2021   Acute left-sided low back pain without sciatica 10/15/2021   Centrilobular emphysema 11/12/2020   Secondary cardiomyopathy 11/12/2020   Subacute thyroiditis 09/25/2020   Brow ptosis, bilateral 07/03/2020   Dermatochalasis of both upper eyelids 07/03/2020   Ptosis, both eyelids 07/03/2020   Impacted cerumen of right ear 03/06/2020   Sensorineural hearing loss (SNHL) of right ear with restricted hearing of left ear 03/06/2020   Atrial fibrillation, chronic 02/24/2019   Primary osteoarthritis involving multiple joints 02/21/2017   Current smoker 02/21/2017   AMD (age-related macular degeneration), bilateral 11/10/2016   Insomnia 07/20/2016   Restless legs 07/20/2016   Spinal stenosis in cervical region 10/14/2015   Senile purpura 04/18/2015   Osteopenia 01/28/2015   Spinal stenosis of lumbar region 01/20/2015   BPH (benign prostatic hyperplasia) 01/20/2015   HTN (hypertension), benign 04/17/2014   Lumbar disc disease 03/05/2014   Basal cell carcinoma of face 11/08/2011   PCP:  Babs Sciara, MD Pharmacy:   [facility] - [place], [place] - [address] [place] [address] Phone: [phone] Fax: [phone]     Social Determinants of Health (SDOH) Social  History: SDOH Screenings   Food Insecurity: No Food Insecurity (02/15/2023)  Housing: Low Risk  (02/15/2023)  Transportation Needs: No Transportation Needs (02/15/2023)  Utilities: Not At Risk (02/15/2023)  Alcohol Screen: Low Risk  (03/16/2022)  Depression (PHQ2-9): Low Risk  (01/12/2023)  Financial Resource Strain: Low Risk  (03/16/2022)  Physical Activity: Sufficiently Active (03/16/2022)  Social Connections: Moderately Integrated (03/16/2022)  Stress: No Stress Concern Present (03/16/2022)  Tobacco Use: High Risk (02/14/2023)   SDOH Interventions:     Readmission Risk Interventions     No data to display

## 2023-02-15 NOTE — ED Notes (Signed)
Pt having intermittent episodes of sleep apnea, O2 dropping to 78-89. Admitting MD messaged, awaiting response.

## 2023-02-15 NOTE — Plan of Care (Signed)
  Problem: Acute Rehab OT Goals (only OT should resolve) Goal: Pt. Will Perform Grooming Flowsheets (Taken 02/15/2023 1012) Pt Will Perform Grooming:  with modified independence  sitting Goal: Pt. Will Perform Lower Body Bathing Flowsheets (Taken 02/15/2023 1012) Pt Will Perform Lower Body Bathing:  with min assist  sitting/lateral leans  with adaptive equipment Goal: Pt. Will Perform Upper Body Dressing Flowsheets (Taken 02/15/2023 1012) Pt Will Perform Upper Body Dressing:  with modified independence  sitting Goal: Pt. Will Perform Lower Body Dressing Flowsheets (Taken 02/15/2023 1012) Pt Will Perform Lower Body Dressing:  with mod assist  with adaptive equipment  with min assist  sitting/lateral leans Goal: Pt. Will Transfer To Toilet Flowsheets (Taken 02/15/2023 1012) Pt Will Transfer to Toilet:  with min guard assist  stand pivot transfer Goal: Pt. Will Perform Toileting-Clothing Manipulation Flowsheets (Taken 02/15/2023 1012) Pt Will Perform Toileting - Clothing Manipulation and hygiene:  with min guard assist  sitting/lateral leans Goal: Pt/Caregiver Will Perform Home Exercise Program Flowsheets (Taken 02/15/2023 1012) Pt/caregiver will Perform Home Exercise Program:  Increased strength  Both right and left upper extremity  Independently  Ashyah Quizon OT, MOT

## 2023-02-15 NOTE — Evaluation (Signed)
Clinical/Bedside Swallow Evaluation Patient Details  Name: Gerald Hall MRN: [id number] Date of Birth: 1935/08/20  Today's Date: 02/15/2023 Time: SLP Start Time (ACUTE ONLY): 1255 SLP Stop Time (ACUTE ONLY): 1322 SLP Time Calculation (min) (ACUTE ONLY): 27 min  Past Medical History:  Past Medical History:  Diagnosis Date   Aortic atherosclerosis 06/23/2022   Seen on CAT scan   Arthritis    Balance problem    Uses a walker   Blood in urine    Sees urology yearly   Chronic HFrEF (heart failure with reduced ejection fraction)    Chronic kidney disease, stage 3    Coronary artery calcification seen on CT scan    Deafness in right ear age 59   Hyperlipidemia    Hypertension    Intractable nausea and vomiting 02/04/2023   Prolonged QT interval    PSVT (paroxysmal supraventricular tachycardia)    Temporal arteritis 2016   Urinary incontinence    Past Surgical History:  Past Surgical History:  Procedure Laterality Date   BACK SURGERY  2010   BELPHAROPTOSIS REPAIR     CATARACT EXTRACTION Bilateral 07/30/14, 08/13/2014   CERVICAL SPINE SURGERY  09/01/15   C 4-5 , [facility] ,    COLONOSCOPY  12/19/2007   RMR: 1. External hemorrohids, otherwise normal rectum.2. Normal colon. 3. Normal terminal ileum.   COLONOSCOPY N/A 12/13/2014   Procedure: COLONOSCOPY;  Surgeon: Corbin Ade, MD;  Location: [facility];  Service: Endoscopy;  Laterality: N/A;  11:15 Pt Request Time   HIP ARTHROPLASTY Left 01/27/2023   Procedure: ARTHROPLASTY BIPOLAR HIP (HEMIARTHROPLASTY);  Surgeon: Oliver Barre, MD;  Location: [facility] ORS;  Service: Orthopedics;  Laterality: Left;   KNEE SURGERY Left    around 2000, arthroscopic   TOTAL HIP ARTHROPLASTY Right 02/23/2017   Procedure: TOTAL HIP ARTHROPLASTY ANTERIOR APPROACH;  Surgeon: Kennedy Bucker, MD;  Location: [facility] ORS;  Service: Orthopedics;  Laterality: Right;   HPI:  Gerald Hall is an 88 y.o. male with medical history significant of COPD on  supplemental oxygen via [place] at 2 LPM at baseline, atrial fibrillation, CKD 3, BPH, HFrEF (echo done in February 2024 showed LVEF of 30 to 35%) who presents to the emergency department from cardiology office due to being somnolent during the office visit.  Patient was admitted from 4/3-4/12 due to closed displaced fracture of the left femoral neck s/p left hip hemiarthroplasty and was discharged to a SNF.  Most of the history was obtained from son-in-law at bedside.  Per report, patient has not been able to return to his baseline of functioning since the surgery.  He used to be able to to take care of some of his ADLs, but now requires maximal assist to do almost anything, his mental cognition was also said to have declined and family was worried about possible stroke.  Patient was also reported not to have been able to stand or ambulate since the surgery (different from baseline).  He has several days of nonproductive cough with chest congestion. Chest xray shows:Left lower lobe airspace disease consistent with infection or aspiration. Head CT shows: Hypodensity throughout the bilateral basal ganglia and thalami,  which is technically age indeterminate and favored to be chronic but  could be the sequela of acute or subacute infarcts. Correlate with  symptoms and consider MRI if there is clinical concern for acute or  subacute infarct. Mucosal thickening in the ethmoid air cells and sphenoid sinuses,  with left sphenoid sinus air-fluid  level, which can be seen in the  setting of acute sinusitis. MRI pending. BSE requested.    Assessment / Plan / Recommendation  Clinical Impression  Clinical swallow evaluation completed at bedside with daughter present. Pt had recent hip surgery in early April and has not returned to his baseline, per daughter. He was alert, talkative, and cooperative during my assessment today. He typically takes a handful of pills with water without difficulty, but daughter reports finding loose  pills among his bed sheets (at SNF). Oral motor exam is WNL. Pt wears U/L partials. He consumed regular textures and thin liquids via cup/straw sips without signs or symptoms of aspiration and no reports of globus. Pt does have COPD and receives 2L O2 via nasal cannula at baseline. Pt is at risk for aspiration due to deconditioning and compromised respiratory status. He and his daughter were encouraged to have Pt take small bites/sips, avoid talking during PO intake, and sit upright during and after meals. Continue regular textures and thin liquids with aspiration and reflux precautions, pills whole with water or in puree. SLP will sign off at this time.    SLP Visit Diagnosis: Dysphagia, unspecified (R13.10)    Aspiration Risk  Mild aspiration risk    Diet Recommendation Regular;Thin liquid   Liquid Administration via: Cup;Straw Medication Administration: Whole meds with liquid (or whole with puree) Supervision: Patient able to self feed;Intermittent supervision to cue for compensatory strategies Compensations: Slow rate;Small sips/bites Postural Changes: Seated upright at 90 degrees;Remain upright for at least 30 minutes after po intake    Other  Recommendations Oral Care Recommendations: Oral care BID;Staff/trained caregiver to provide oral care    Recommendations for follow up therapy are one component of a multi-disciplinary discharge planning process, led by the attending physician.  Recommendations may be updated based on patient status, additional functional criteria and insurance authorization.  Follow up Recommendations No SLP follow up      Assistance Recommended at Discharge    Functional Status Assessment Patient has not had a recent decline in their functional status  Frequency and Duration            Prognosis Prognosis for improved oropharyngeal function: Good      Swallow Study   General Date of Onset: 02/14/23 HPI: Gerald Hall is an 88 y.o. male with medical  history significant of COPD on supplemental oxygen via [place] at 2 LPM at baseline, atrial fibrillation, CKD 3, BPH, HFrEF (echo done in February 2024 showed LVEF of 30 to 35%) who presents to the emergency department from cardiology office due to being somnolent during the office visit.  Patient was admitted from 4/3-4/12 due to closed displaced fracture of the left femoral neck s/p left hip hemiarthroplasty and was discharged to a SNF.  Most of the history was obtained from son-in-law at bedside.  Per report, patient has not been able to return to his baseline of functioning since the surgery.  He used to be able to to take care of some of his ADLs, but now requires maximal assist to do almost anything, his mental cognition was also said to have declined and family was worried about possible stroke.  Patient was also reported not to have been able to stand or ambulate since the surgery (different from baseline).  He has several days of nonproductive cough with chest congestion. Chest xray shows:Left lower lobe airspace disease consistent with infection or aspiration. Head CT shows: Hypodensity throughout the bilateral basal ganglia and thalami,  which is technically age indeterminate and favored to be chronic but  could be the sequela of acute or subacute infarcts. Correlate with  symptoms and consider MRI if there is clinical concern for acute or  subacute infarct. Mucosal thickening in the ethmoid air cells and sphenoid sinuses,  with left sphenoid sinus air-fluid level, which can be seen in the  setting of acute sinusitis. MRI pending. BSE requested. Type of Study: Bedside Swallow Evaluation Previous Swallow Assessment: N/A Diet Prior to this Study: Regular;Thin liquids (Level 0) Temperature Spikes Noted: No Respiratory Status: Nasal cannula History of Recent Intubation: No Behavior/Cognition: Alert;Cooperative;Pleasant mood Oral Cavity Assessment: Within Functional Limits (very mild white coating on  tongue) Oral Care Completed by SLP: Yes Oral Cavity - Dentition:  (partials) Vision: Functional for self-feeding Self-Feeding Abilities: Able to feed self;Needs set up (new onset of recent tremor) Patient Positioning: Upright in bed Baseline Vocal Quality: Normal Volitional Cough: Strong;Congested Volitional Swallow: Able to elicit    Oral/Motor/Sensory Function Overall Oral Motor/Sensory Function: Within functional limits   Ice Chips Ice chips: Within functional limits Presentation: Spoon   Thin Liquid Thin Liquid: Within functional limits Presentation: Cup;Self Fed;Straw    Nectar Thick Nectar Thick Liquid: Not tested   Honey Thick Honey Thick Liquid: Not tested   Puree Puree: Within functional limits Presentation: Spoon   Solid     Solid: Within functional limits Presentation: Self Fed     Thank you,  Havery Moros, CCC-SLP [phone]  Haruka Kowaleski 02/15/2023,1:39 PM

## 2023-02-15 NOTE — Evaluation (Signed)
Physical Therapy Evaluation Patient Details Name: Gerald Hall MRN: [id number] DOB: October 20, 1935 Today's Date: 02/15/2023  History of Present Illness  Gerald Hall is an 88 y.o. male with medical history significant of COPD on supplemental oxygen via [place] at 2 LPM at baseline, atrial fibrillation, CKD 3, BPH, HFrEF (echo done in February 2024 showed LVEF of 30 to 35%) who presents to the emergency department from cardiology office due to being somnolent during the office visit.  Patient was admitted from 4/32/12 due to closed displaced fracture of the left femoral neck s/p left hip hemiarthroplasty and was discharged to a SNF.  Most of the history was obtained from son-in-law at bedside.  Per report, patient has not been able to return to his baseline of functioning since the surgery.  He used to be able to to take care of some of his ADLs, but now requires maximal assist to do almost anything, his mental cognition was also said to have declined and family was worried about possible stroke.  Patient was also reported not to have been able to stand or ambulate since the surgery (different from baseline).  He has several days of nonproductive cough with chest congestion.   Clinical Impression  Patient demonstrates slow labored movement for sitting up at bedside with fair/good return for scooting EOB after verbal/tactile cueing, able to take a few side steps, steps forward/backward at bedside before requesting to sit due to c/o fatigue and generalized weakness.  Patient tolerated sitting up in chair with family member present after therapy - RN notified.  Patient will benefit from continued skilled physical therapy in hospital and recommended venue below to increase strength, balance, endurance for safe ADLs and gait.          Recommendations for follow up therapy are one component of a multi-disciplinary discharge planning process, led by the attending physician.  Recommendations may be updated based on  patient status, additional functional criteria and insurance authorization.  Follow Up Recommendations Can patient physically be transported by private vehicle: No     Assistance Recommended at Discharge Set up Supervision/Assistance  Patient can return home with the following  A lot of help with walking and/or transfers;A lot of help with bathing/dressing/bathroom;Assistance with cooking/housework;Assist for transportation;Help with stairs or ramp for entrance    Equipment Recommendations None recommended by PT  Recommendations for Other Services       Functional Status Assessment Patient has had a recent decline in their functional status and demonstrates the ability to make significant improvements in function in a reasonable and predictable amount of time.     Precautions / Restrictions Precautions Precautions: Fall Precaution Comments: Posterior hip precautions. Restrictions Weight Bearing Restrictions: Yes LLE Weight Bearing: Weight bearing as tolerated      Mobility  Bed Mobility Overal bed mobility: Needs Assistance Bed Mobility: Supine to Sit     Supine to sit: Mod assist     General bed mobility comments: increased time, labored movement    Transfers Overall transfer level: Needs assistance Equipment used: Rolling walker (2 wheels) Transfers: Sit to/from Stand, Bed to chair/wheelchair/BSC Sit to Stand: Mod assist   Step pivot transfers: Mod assist       General transfer comment: unsteady labored movement    Ambulation/Gait Ambulation/Gait assistance: Mod assist, Max assist Gait Distance (Feet): 5 Feet Assistive device: Rolling walker (2 wheels) Gait Pattern/deviations: Decreased step length - right, Decreased step length - left, Decreased stance time - left, Decreased stride length, Antalgic, Trunk  flexed Gait velocity: slow     General Gait Details: limited to a few slow labored side steps and a couple of steps forward/backwards before having to  sit due to c/o fatigue  Stairs            Wheelchair Mobility    Modified Rankin (Stroke Patients Only)       Balance Overall balance assessment: Needs assistance Sitting-balance support: Feet supported, No upper extremity supported Sitting balance-Leahy Scale: Fair Sitting balance - Comments: fair to good   Standing balance support: During functional activity, Bilateral upper extremity supported Standing balance-Leahy Scale: Poor Standing balance comment: fair/poor using RW                             Pertinent Vitals/Pain Pain Assessment Pain Assessment: Faces Faces Pain Scale: Hurts a little bit Pain Location: L hip Pain Descriptors / Indicators: Discomfort Pain Intervention(s): Limited activity within patient's tolerance, Monitored during session, Repositioned    Home Living Family/patient expects to be discharged to:: Private residence Living Arrangements: Children Available Help at Discharge: Personal care attendant;Available 24 hours/day Type of Home: House Home Access: Ramped entrance       Home Layout: One level Home Equipment: Agricultural consultant (2 wheels);Rollator (4 wheels);Shower seat;BSC/3in1;Cane - single point;Other (comment)      Prior Function Prior Level of Function : Needs assist       Physical Assist : Mobility (physical);ADLs (physical) Mobility (physical): Transfers;Gait;Stairs;Bed mobility ADLs (physical): Grooming;Bathing;Toileting;Dressing;IADLs Mobility Comments: Pt coming from SNF where he needed much assist for ambulation of ~5 feet with RW per daughter's report. ADLs Comments: Assisted by SNF staff.     Hand Dominance   Dominant Hand: Right    Extremity/Trunk Assessment   Upper Extremity Assessment Upper Extremity Assessment: Defer to OT evaluation    Lower Extremity Assessment Lower Extremity Assessment: Generalized weakness;LLE deficits/detail LLE Deficits / Details: grossly -4/5 LLE: Unable to fully  assess due to pain LLE Sensation: WNL LLE Coordination: WNL    Cervical / Trunk Assessment Cervical / Trunk Assessment: Kyphotic  Communication   Communication: HOH;Deaf  Cognition Arousal/Alertness: Awake/alert Behavior During Therapy: WFL for tasks assessed/performed Overall Cognitive Status: Within Functional Limits for tasks assessed                                          General Comments      Exercises     Assessment/Plan    PT Assessment Patient needs continued PT services  PT Problem List Decreased strength;Decreased activity tolerance;Decreased balance;Decreased mobility       PT Treatment Interventions DME instruction;Therapeutic exercise;Gait training;Balance training;Stair training;Neuromuscular re-education;Functional mobility training;Therapeutic activities;Patient/family education    PT Goals (Current goals can be found in the Care Plan section)  Acute Rehab PT Goals Patient Stated Goal: return home after rehab PT Goal Formulation: With patient/family Time For Goal Achievement: 03/01/23 Potential to Achieve Goals: Good    Frequency Min 3X/week     Co-evaluation PT/OT/SLP Co-Evaluation/Treatment: Yes Reason for Co-Treatment: To address functional/ADL transfers PT goals addressed during session: Mobility/safety with mobility;Balance;Proper use of DME         AM-PAC PT "6 Clicks" Mobility  Outcome Measure Help needed turning from your back to your side while in a flat bed without using bedrails?: A Lot Help needed moving from lying on your back to sitting  on the side of a flat bed without using bedrails?: A Lot Help needed moving to and from a bed to a chair (including a wheelchair)?: A Lot Help needed standing up from a chair using your arms (e.g., wheelchair or bedside chair)?: A Lot Help needed to walk in hospital room?: A Lot Help needed climbing 3-5 steps with a railing? : Total 6 Click Score: 11    End of Session  Equipment Utilized During Treatment: Oxygen Activity Tolerance: Patient tolerated treatment well;Patient limited by fatigue Patient left: in chair;with call bell/phone within reach;with family/visitor present Nurse Communication: Mobility status PT Visit Diagnosis: Unsteadiness on feet (R26.81);Other abnormalities of gait and mobility (R26.89);Muscle weakness (generalized) (M62.81);Pain Pain - Right/Left: Left Pain - part of body: Hip    Time: 6045-4098 PT Time Calculation (min) (ACUTE ONLY): 20 min   Charges:   PT Evaluation $PT Eval Moderate Complexity: 1 Mod PT Treatments $Therapeutic Activity: 8-22 mins        3:12 PM, 02/15/23 Ocie Bob, MPT Physical Therapist with [facility] [phone] office [phone] mobile phone

## 2023-02-16 ENCOUNTER — Other Ambulatory Visit: Payer: Self-pay

## 2023-02-16 ENCOUNTER — Telehealth: Payer: Self-pay | Admitting: Physical Medicine and Rehabilitation

## 2023-02-16 DIAGNOSIS — J189 Pneumonia, unspecified organism: Secondary | ICD-10-CM | POA: Diagnosis not present

## 2023-02-16 DIAGNOSIS — I5022 Chronic systolic (congestive) heart failure: Secondary | ICD-10-CM | POA: Diagnosis not present

## 2023-02-16 DIAGNOSIS — E0590 Thyrotoxicosis, unspecified without thyrotoxic crisis or storm: Secondary | ICD-10-CM | POA: Diagnosis not present

## 2023-02-16 DIAGNOSIS — I482 Chronic atrial fibrillation, unspecified: Secondary | ICD-10-CM | POA: Diagnosis not present

## 2023-02-16 LAB — COMPREHENSIVE METABOLIC PANEL WITH GFR
ALT: 13 U/L (ref 0–44)
AST: 10 U/L — ABNORMAL LOW (ref 15–41)
Albumin: 2.2 g/dL — ABNORMAL LOW (ref 3.5–5.0)
Alkaline Phosphatase: 81 U/L (ref 38–126)
Anion gap: 8 (ref 5–15)
BUN: 26 mg/dL — ABNORMAL HIGH (ref 8–23)
CO2: 26 mmol/L (ref 22–32)
Calcium: 8.6 mg/dL — ABNORMAL LOW (ref 8.9–10.3)
Chloride: 108 mmol/L (ref 98–111)
Creatinine, Ser: 1.38 mg/dL — ABNORMAL HIGH (ref 0.61–1.24)
GFR, Estimated: 50 mL/min — ABNORMAL LOW
Glucose, Bld: 104 mg/dL — ABNORMAL HIGH (ref 70–99)
Potassium: 3.3 mmol/L — ABNORMAL LOW (ref 3.5–5.1)
Sodium: 142 mmol/L (ref 135–145)
Total Bilirubin: 0.8 mg/dL (ref 0.3–1.2)
Total Protein: 5.8 g/dL — ABNORMAL LOW (ref 6.5–8.1)

## 2023-02-16 LAB — STREP PNEUMONIAE URINARY ANTIGEN: Strep Pneumo Urinary Antigen: NEGATIVE

## 2023-02-16 LAB — MRSA NEXT GEN BY PCR, NASAL: MRSA by PCR Next Gen: NOT DETECTED

## 2023-02-16 LAB — CBC
HCT: 36.4 % — ABNORMAL LOW (ref 39.0–52.0)
Hemoglobin: 10.8 g/dL — ABNORMAL LOW (ref 13.0–17.0)
MCH: 29.8 pg (ref 26.0–34.0)
MCHC: 29.7 g/dL — ABNORMAL LOW (ref 30.0–36.0)
MCV: 100.3 fL — ABNORMAL HIGH (ref 80.0–100.0)
Platelets: 217 K/uL (ref 150–400)
RBC: 3.63 MIL/uL — ABNORMAL LOW (ref 4.22–5.81)
RDW: 14.4 % (ref 11.5–15.5)
WBC: 9.4 K/uL (ref 4.0–10.5)
nRBC: 0 % (ref 0.0–0.2)

## 2023-02-16 LAB — TSH: TSH: 0.658 u[IU]/mL (ref 0.350–4.500)

## 2023-02-16 LAB — MAGNESIUM: Magnesium: 1.8 mg/dL (ref 1.7–2.4)

## 2023-02-16 LAB — POTASSIUM: Potassium: 3.5 mmol/L (ref 3.5–5.1)

## 2023-02-16 LAB — PHOSPHORUS: Phosphorus: 3 mg/dL (ref 2.5–4.6)

## 2023-02-16 MED ORDER — BOOST / RESOURCE BREEZE PO LIQD CUSTOM
1.0000 | Freq: Every day | ORAL | Status: DC
  Administered 2023-02-18: 1 via ORAL

## 2023-02-16 MED ORDER — POTASSIUM CHLORIDE CRYS ER 20 MEQ PO TBCR
40.0000 meq | EXTENDED_RELEASE_TABLET | Freq: Once | ORAL | Status: AC
  Administered 2023-02-16: 40 meq via ORAL
  Filled 2023-02-16: qty 2

## 2023-02-16 MED ORDER — AMIODARONE HCL 200 MG PO TABS
100.0000 mg | ORAL_TABLET | Freq: Every day | ORAL | Status: DC
  Administered 2023-02-16 – 2023-02-18 (×3): 100 mg via ORAL
  Filled 2023-02-16 (×3): qty 1

## 2023-02-16 NOTE — TOC Progression Note (Signed)
Transition of Care [facility]) - Progression Note    Patient Details  Name: Gerald Hall MRN: [id number] Date of Birth: 11-23-1934  [facility] CM/SW Contact  Elliot Gault, LCSW Phone Number: 02/16/2023, 12:09 PM  Clinical Narrative:     TOC following. Spoke with pt's daughter, Harriett Sine, to update on bed search efforts. She is aware that none of the requested alternative SNFs made a bed offer. Per Harriett Sine, they are agreeable to pt returning to [facility] when stable for dc.  TOC to start insurance auth in anticipation of dc in 1-2 days. Updated Tiffany at [employer]. TOC will follow.  Expected Discharge Plan: Skilled Nursing Facility Barriers to Discharge: Continued Medical Work up  Expected Discharge Plan and Services In-house Referral: Clinical Social Work   Post Acute Care Choice: Skilled Nursing Facility                                         Social Determinants of Health (SDOH) Interventions SDOH Screenings   Food Insecurity: No Food Insecurity (02/15/2023)  Housing: Low Risk  (02/15/2023)  Transportation Needs: No Transportation Needs (02/15/2023)  Utilities: Not At Risk (02/15/2023)  Alcohol Screen: Low Risk  (03/16/2022)  Depression (PHQ2-9): Low Risk  (01/12/2023)  Financial Resource Strain: Low Risk  (03/16/2022)  Physical Activity: Sufficiently Active (03/16/2022)  Social Connections: Moderately Integrated (03/16/2022)  Stress: No Stress Concern Present (03/16/2022)  Tobacco Use: High Risk (02/14/2023)    Readmission Risk Interventions     No data to display

## 2023-02-16 NOTE — Progress Notes (Signed)
PROGRESS NOTE    [name]  ZOX:[id number] DOB: 1935/06/23 DOA: 02/14/2023 PCP: Babs Sciara, MD   Brief Narrative: Gerald Hall is a 88 y.o. male with a history of COPD, chronic respiratory failure, atrial fibrillation, CKD stage III, BPH, HFrEF. Patient presented from her cardiologist's office secondary somnolence and was found to have evidence of pneumonia. Empiric Vancomycin and Cefepime started on admission. Symptoms improving.   Assessment and Plan:  Aspiration pneumonia Patient started empirically on Vancomycin/Cefepime with improvement of symptoms. Leukocytosis improved. -Continue Vancomycin/Cefepime -MRSA swab and discontinue Vancomycin if negative -Transition to oral antibiotics in AM  Chronic respiratory failure with hypoxia Patient stable on home oxygen at 2 L/min  Abnormal CT Concern for possible subacute/acute infarcts on CT scan. MRI ruled out stroke.  Recent left hip arthroplasty Generalized weakness Patient evaluated by PT/OT. Recommendation for discharge to SNF.  Prolonged QT interval Resolved.  Hypokalemia Potassium of 3.3 this morning. -Potassium supplementation -Repeat potassium check this afternoon  Mild malnutrition -Continue feeding supplementation  Stage IIIa Stable.  COPD Stable. -Continue Duonebs  Chronic atrial fibrillation History of PSVT Patient is managed on amiodarone and Coreg as an outpatient. Coreg continued and amiodarone held secondary to prolonged QTc. QTc improved to 468 msec. Per cardiology notes, recommendation to reduce to amiodarone 100 mg daily. Currently in sinus rhythm -Continue Coreg -Restart at decreased dose of amiodarone 100 mg daily  Prolonged PR interval Appears to be chronic. Patient is on Coreg. Noted by cardiology on recent office visit notes. No bradycardia.  Chronic HFrEF Stable. LVEF of 30-35% from Transthoracic Echocardiogram in 11/2022 with associated grade 1 diastolic  dysfunction.  Hyperthyroidism -Continue methimazole  Restless leg syndrome -Continue Requip  Obesity Estimated body mass index is 31.93 kg/m as calculated from the following:   Height as of this encounter: 5\' 8"  (1.727 m).   Weight as of this encounter: 95.2 kg.    DVT prophylaxis: SCDs Code Status:   Code Status: DNR Family Communication: Daughter at bedside Disposition Plan: Discharge to SNF likely in AM once able to transition to oral antibiotic regimen   Consultants:  None  Procedures:  None  Antimicrobials: Vancomycin Cefepime    Subjective: Patient reports feeling much better than on admission. Still with some cough. Significant diarrhea yesterday.  Objective: BP (!) 121/54 (BP Location: Right Arm)   Pulse 65   Temp (!) 97.5 F (36.4 C)   Resp 20   Ht 5\' 8"  (1.727 m)   Wt 95.2 kg   SpO2 98%   BMI 31.93 kg/m   Examination:  General exam: Appears calm and comfortable Respiratory system: Clear to auscultation. Respiratory effort normal. Cardiovascular system: S1 & S2 heard, RRR. Gastrointestinal system: Abdomen is nondistended, soft and nontender. Normal bowel sounds heard. Central nervous system: Alert and oriented. No focal neurological deficits. Musculoskeletal: No edema. No calf tenderness Skin: No cyanosis. No rashes Psychiatry: Judgement and insight appear normal. Mood & affect appropriate.    Data Reviewed: I have personally reviewed following labs and imaging studies  CBC Lab Results  Component Value Date   WBC 9.4 02/16/2023   RBC 3.63 (L) 02/16/2023   HGB 10.8 (L) 02/16/2023   HCT 36.4 (L) 02/16/2023   MCV 100.3 (H) 02/16/2023   MCH 29.8 02/16/2023   PLT 217 02/16/2023   MCHC 29.7 (L) 02/16/2023   RDW 14.4 02/16/2023   LYMPHSABS 1.3 02/14/2023   MONOABS 1.5 (H) 02/14/2023   EOSABS 0.4 02/14/2023   BASOSABS 0.0 02/14/2023  Last metabolic panel Lab Results  Component Value Date   NA 142 02/16/2023   K 3.3 (L)  02/16/2023   CL 108 02/16/2023   CO2 26 02/16/2023   BUN 26 (H) 02/16/2023   CREATININE 1.38 (H) 02/16/2023   GLUCOSE 104 (H) 02/16/2023   GFRNONAA 50 (L) 02/16/2023   GFRAA 48 (L) 11/24/2020   CALCIUM 8.6 (L) 02/16/2023   PHOS 3.0 02/16/2023   PROT 5.8 (L) 02/16/2023   ALBUMIN 2.2 (L) 02/16/2023   BILITOT 0.8 02/16/2023   ALKPHOS 81 02/16/2023   AST 10 (L) 02/16/2023   ALT 13 02/16/2023   ANIONGAP 8 02/16/2023    GFR: Estimated Creatinine Clearance: 43.8 mL/min (A) (by C-G formula based on SCr of 1.38 mg/dL (H)).  Recent Results (from the past 240 hour(s))  Blood culture (routine x 2)     Status: None (Preliminary result)   Collection Time: 02/14/23  9:27 PM   Specimen: BLOOD  Result Value Ref Range Status   Specimen Description BLOOD RIGHT ANTECUBITAL  Final   Special Requests   Final    BOTTLES DRAWN AEROBIC AND ANAEROBIC Blood Culture adequate volume   Culture   Final    NO GROWTH 2 DAYS Performed at [facility], [address], [place], [place] [address]    Report Status PENDING  Incomplete  Blood culture (routine x 2)     Status: None (Preliminary result)   Collection Time: 02/14/23  9:28 PM   Specimen: BLOOD  Result Value Ref Range Status   Specimen Description BLOOD BLOOD RIGHT HAND  Final   Special Requests   Final    BOTTLES DRAWN AEROBIC AND ANAEROBIC Blood Culture adequate volume   Culture   Final    NO GROWTH 2 DAYS Performed at [facility], [address] [place], [place] [address]    Report Status PENDING  Incomplete      Radiology Studies: MR BRAIN WO CONTRAST  Result Date: 02/15/2023 CLINICAL DATA:  Neuro deficit, acute, stroke suspected. EXAM: MRI HEAD WITHOUT CONTRAST TECHNIQUE: Multiplanar, multiecho pulse sequences of the brain and surrounding structures were obtained without intravenous contrast. COMPARISON:  Brain MRI 08/08/2015. Head CT and CTA head/neck 02/14/2023. FINDINGS: Brain: No acute infarct or hemorrhage. Severe chronic  small-vessel disease. Old lacunar infarct in the left thalamus. Prominent perivascular spaces throughout the bilateral basal ganglia and thalami (etat crible). No hydrocephalus or extra-axial collection. No mass or midline shift. Vascular: Normal flow voids. Skull and upper cervical spine: Normal marrow signal. Sinuses/Orbits: Air-fluid level in the left sphenoid sinus. Orbits are unremarkable. Other: Unchanged bilateral parotid masses, better evaluated on the prior CTA. IMPRESSION: 1. No acute intracranial abnormality. 2. Severe chronic small-vessel disease. Electronically Signed   By: Orvan Falconer M.D.   On: 02/15/2023 14:34   CT ANGIO HEAD NECK W WO CM  Result Date: 02/14/2023 CLINICAL DATA:  Initial evaluation for neuro deficit, stroke. EXAM: CT ANGIOGRAPHY HEAD AND NECK WITH AND WITHOUT CONTRAST TECHNIQUE: Multidetector CT imaging of the head and neck was performed using the standard protocol during bolus administration of intravenous contrast. Multiplanar CT image reconstructions and MIPs were obtained to evaluate the vascular anatomy. Carotid stenosis measurements (when applicable) are obtained utilizing NASCET criteria, using the distal internal carotid diameter as the denominator. RADIATION DOSE REDUCTION: This exam was performed according to the departmental dose-optimization program which includes automated exposure control, adjustment of the mA and/or kV according to patient size and/or use of iterative reconstruction technique. CONTRAST:  75mL  OMNIPAQUE IOHEXOL 350 MG/ML SOLN COMPARISON:  CT from earlier the same day. FINDINGS: CTA NECK FINDINGS Aortic arch: Visualized aortic arch normal in caliber with standard branch pattern. Atheromatous change about the arch itself. No stenosis about the origin of the great vessels. Right carotid system: Right common and internal carotid arteries are tortuous without dissection. Mild atheromatous change about the right carotid bulb without hemodynamically  significant greater than 50% stenosis. Left carotid system: Left common and internal carotid arteries are tortuous without evidence for dissection. Mild atheromatous change without hemodynamically significant stenosis. Vertebral arteries: Both vertebral arteries arise from subclavian arteries. No proximal subclavian artery stenosis. Vertebral arteries are patent without stenosis or dissection. Skeleton: No worrisome osseous lesions. Prior ACDF at C4-C5. Advanced spondylosis at C5-6 and C6-7. Large geode formation noted at the dens. Degenerative changes noted about the right TMJ. Other neck: No other acute soft tissue abnormality within the neck. 2 cm soft tissue mass present at the inferior left parotid gland (series 4, image 80), indeterminate. Similar 1.3 cm soft tissue lesion present within the contralateral right parotid gland (series 4, image 91). Upper chest: Hazy subsegmental atelectatic changes noted within the visualized lungs. Scattered small volume secretions noted within the mid esophageal lumen. Review of the MIP images confirms the above findings CTA HEAD FINDINGS Anterior circulation: Atheromatous irregularity within the carotid siphons without hemodynamically significant stenosis. 3 mm outpouching extending inferiorly from the supraclinoid right ICA felt to be most consistent with a vascular infundibulum related to a hypoplastic right PCOM. A1 segments patent bilaterally. Normal anterior communicating complex. Anterior cerebral arteries are patent without significant stenosis. Right M1 segment widely patent. Short-segment mild-to-moderate stenosis involving the mid left M1 segment noted (series 9, image 21). No proximal MCA branch occlusion or high-grade stenosis. Distal MCA branches are perfused and symmetric. Posterior circulation: Both V4 segments are tortuous but widely patent without stenosis. Both PICA patent at their origins. Basilar patent without stenosis. Superior cerebellar arteries patent  bilaterally. Both PCAs primarily supplied via the basilar. Right PCA widely patent to its distal aspect. Atheromatous irregularity and tortuosity about the proximal left ECA with associated moderate left P1/P2 stenoses (series 9, image 20). Left PCA otherwise patent to its distal aspect. Venous sinuses: Patent allowing for timing the contrast bolus. Anatomic variants: None significant.  No aneurysm. Review of the MIP images confirms the above findings IMPRESSION: 1. Negative CTA for large vessel occlusion or other emergent finding. 2. Intracranial atherosclerotic disease with associated short-segment mild-to-moderate mid left M1 segment stenosis, with moderate proximal left PCA stenoses. 3. Diffuse tortuosity of the major arterial vasculature of the head and neck, suggesting chronic underlying hypertension. 4. Bilateral parotid lesions measuring up to 2 cm on the left, indeterminate. Nonemergent outpatient ENT referral for further workup and evaluation could be performed for further evaluation as warranted. The Electronically Signed   By: Rise Mu M.D.   On: 02/14/2023 22:22   CT Head Wo Contrast  Result Date: 02/14/2023 CLINICAL DATA:  Memory loss, shortness of breath EXAM: CT HEAD WITHOUT CONTRAST TECHNIQUE: Contiguous axial images were obtained from the base of the skull through the vertex without intravenous contrast. RADIATION DOSE REDUCTION: This exam was performed according to the departmental dose-optimization program which includes automated exposure control, adjustment of the mA and/or kV according to patient size and/or use of iterative reconstruction technique. COMPARISON:  None Available. FINDINGS: Brain: Periventricular white matter changes, likely the sequela of chronic small vessel ischemic disease. Additional hypodensity is noted throughout the bilateral  basal ganglia and thalami, which is technically age indeterminate but may be the sequela of acute or subacute infarcts. No evidence  of acute hemorrhage, mass, mass effect, or midline shift. No hydrocephalus or extra-axial fluid collection. Cerebral volume is overall within normal limits for age. Vascular: No hyperdense vessel. Skull: Negative for fracture or focal lesion. Sinuses/Orbits: Mucosal thickening in the ethmoid air cells and sphenoid sinuses, with left sphenoid sinus air-fluid level. Status post bilateral lens replacements. Other: Small amount of fluid in the right mastoid air cells. IMPRESSION: 1. Hypodensity throughout the bilateral basal ganglia and thalami, which is technically age indeterminate and favored to be chronic but could be the sequela of acute or subacute infarcts. Correlate with symptoms and consider MRI if there is clinical concern for acute or subacute infarct. 2. Mucosal thickening in the ethmoid air cells and sphenoid sinuses, with left sphenoid sinus air-fluid level, which can be seen in the setting of acute sinusitis. Electronically Signed   By: Wiliam Ke M.D.   On: 02/14/2023 17:34   DG Chest 2 View  Result Date: 02/14/2023 CLINICAL DATA:  Hip surgery 3 weeks ago, decreased mental status, shortness of breath, cough EXAM: CHEST - 2 VIEW COMPARISON:  02/01/2023 FINDINGS: Frontal and lateral views of the chest demonstrate an unremarkable cardiac silhouette. There is left lower lobe consolidation, which could reflect pneumonia or aspiration. No significant pleural effusion. Right chest is clear. No pneumothorax. No acute bony abnormalities. IMPRESSION: 1. Left lower lobe airspace disease consistent with infection or aspiration. Electronically Signed   By: Sharlet Salina M.D.   On: 02/14/2023 17:04      LOS: 2 days    Jacquelin Hawking, MD [facility] 02/16/2023, 7:55 AM   If 7PM-7AM, please contact night-coverage [email or web address]

## 2023-02-16 NOTE — Telephone Encounter (Signed)
Patient in hospital At [facility], family asking if patient is able to receive another back injsectio so he can continue wit P/T in hospital. Please call son-in-law-Ross [phone]

## 2023-02-16 NOTE — Hospital Course (Signed)
Gerald Hall is a 88 y.o. male with a history of COPD, chronic respiratory failure, atrial fibrillation, CKD stage III, BPH, HFrEF. Patient presented from her cardiologist's office secondary somnolence and was found to have evidence of pneumonia. Empiric Vancomycin and Cefepime started on admission. Symptoms improving.

## 2023-02-16 NOTE — Progress Notes (Signed)
Physical Therapy Treatment Patient Details Name: Gerald Hall MRN: [id number] DOB: 19-Aug-1935 Today's Date: 02/16/2023   History of Present Illness Gerald Hall is an 88 y.o. male with medical history significant of COPD on supplemental oxygen via [place] at 2 LPM at baseline, atrial fibrillation, CKD 3, BPH, HFrEF (echo done in February 2024 showed LVEF of 30 to 35%) who presents to the emergency department from cardiology office due to being somnolent during the office visit.  Patient was admitted from 4/32/12 due to closed displaced fracture of the left femoral neck s/p left hip hemiarthroplasty and was discharged to a SNF.  Most of the history was obtained from son-in-law at bedside.  Per report, patient has not been able to return to his baseline of functioning since the surgery.  He used to be able to to take care of some of his ADLs, but now requires maximal assist to do almost anything, his mental cognition was also said to have declined and family was worried about possible stroke.  Patient was also reported not to have been able to stand or ambulate since the surgery (different from baseline).  He has several days of nonproductive cough with chest congestion.    PT Comments    Pt supine in bed with family present, friendly and willing to participate with therapy today.  Pt required increased time, labored movements for mod I bed mobility.  Mod A required with transfer to standing and during gait.  Cueing for safety and assistance with hand placement during sit to stand with use of RW.  Pt limited to short distance during gait prior need to sit due to fatigue and weakness.  Reports of increased pain with weight bearing, monitored through session.  EOS pt left in chair with call bell within reach and family present.  No reports of pain while sitting.      Recommendations for follow up therapy are one component of a multi-disciplinary discharge planning process, led by the attending physician.   Recommendations may be updated based on patient status, additional functional criteria and insurance authorization.  Follow Up Recommendations       Assistance Recommended at Discharge    Patient can return home with the following     Equipment Recommendations       Recommendations for Other Services       Precautions / Restrictions Precautions Precautions: Fall Precaution Comments: Posterior hip precautions. Restrictions LLE Weight Bearing: Weight bearing as tolerated     Mobility  Bed Mobility Overal bed mobility: Modified Independent Bed Mobility: Supine to Sit     Supine to sit: Min guard     General bed mobility comments: increased time, labored movement    Transfers Overall transfer level: Needs assistance Equipment used: Rolling walker (2 wheels) Transfers: Sit to/from Stand, Bed to chair/wheelchair/BSC Sit to Stand: Mod assist           General transfer comment: unsteady labored movement, cueing for hand placement to assist to standing with RW for stability    Ambulation/Gait Ambulation/Gait assistance: Mod assist Gait Distance (Feet): 5 Feet Assistive device: Rolling walker (2 wheels) Gait Pattern/deviations: Decreased step length - right, Decreased step length - left, Decreased stance time - left, Decreased stride length, Antalgic, Trunk flexed Gait velocity: slow     General Gait Details: limited to a few slow labored side steps and a couple of steps forward/backwards before having to sit due to c/o fatigue   Stairs  Wheelchair Mobility    Modified Rankin (Stroke Patients Only)       Balance                                            Cognition Arousal/Alertness: Awake/alert Behavior During Therapy: WFL for tasks assessed/performed Overall Cognitive Status: Within Functional Limits for tasks assessed                                          Exercises General Exercises - Lower  Extremity Long Arc Quad: 10 reps, AROM, Seated, Strengthening, Both Heel Slides: AROM, Both, 10 reps, Supine Hip ABduction/ADduction: AROM, Both, 10 reps, Seated Hip Flexion/Marching: Seated, AROM, Strengthening, Both, 10 reps Toe Raises: AROM, Seated, Strengthening, Both, 10 reps Heel Raises: Seated, AROM, Strengthening, Both, 10 reps    General Comments        Pertinent Vitals/Pain Pain Assessment Pain Assessment: 0-10 Pain Score: 5  Pain Location: L hip weight bearing only Pain Descriptors / Indicators: Discomfort Pain Intervention(s): Monitored during session, Limited activity within patient's tolerance, Repositioned    Home Living                          Prior Function            PT Goals (current goals can now be found in the care plan section)      Frequency           PT Plan Current plan remains appropriate    Co-evaluation              AM-PAC PT "6 Clicks" Mobility   Outcome Measure  Help needed turning from your back to your side while in a flat bed without using bedrails?: A Lot Help needed moving from lying on your back to sitting on the side of a flat bed without using bedrails?: A Lot Help needed moving to and from a bed to a chair (including a wheelchair)?: A Lot Help needed standing up from a chair using your arms (e.g., wheelchair or bedside chair)?: A Lot Help needed to walk in hospital room?: A Lot Help needed climbing 3-5 steps with a railing? : Total 6 Click Score: 11    End of Session Equipment Utilized During Treatment: Oxygen Activity Tolerance: Patient tolerated treatment well;Patient limited by fatigue Patient left: in chair;with call bell/phone within reach;with family/visitor present Nurse Communication: Mobility status PT Visit Diagnosis: Unsteadiness on feet (R26.81);Other abnormalities of gait and mobility (R26.89);Muscle weakness (generalized) (M62.81);Pain     Time: 1610-9604 PT Time Calculation (min)  (ACUTE ONLY): 1434 min  Charges:  $Therapeutic Exercise: 8-22 mins $Therapeutic Activity: 8-22 mins                     Becky Sax, LPTA/CLT; CBIS [phone]  Juel Burrow 02/16/2023, 4:07 PM

## 2023-02-16 NOTE — Progress Notes (Signed)
   02/16/23 0858  Spiritual Encounters  Type of Visit Follow up  Care provided to: Patient;Family  Conversation partners present during encounter Nurse  Referral source IDT Rounds  Reason for visit Routine spiritual support  OnCall Visit No  Spiritual Framework  Presenting Themes Meaning/purpose/sources of inspiration;Goals in life/care;Impactful experiences and emotions;Significant life change  Community/Connection Family;Friend(s);Faith community  Patient Stress Factors Health changes;Loss;Loss of control;Major life changes  Family Stress Factors Family relationships;Loss  Interventions  Spiritual Care Interventions Made Reflective listening;Compassionate presence;Narrative/life review;Meaning making;Prayer  Intervention Outcomes  Outcomes Connection to spiritual care;Awareness of support;Reduced anxiety;Autonomy/agency;Connection to values and goals of care;Awareness around self/spiritual resourses  Spiritual Care Plan  Spiritual Care Issues Still Outstanding Chaplain will continue to follow   Referred to patient during IDT rounds today. Spoke with nurse who shared that patient was more clear than earlier in the day. 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 Chaplain welcomed warmly bedside and engaged Tommy in reflection around his illness story and decline over the last 6+ months. He was very open today sharing how he was certain that his time 'to go on' was near last night when he arrived in the ED. He stated that his health has declined significantly and he isn't sure what this may look like moving forward. We talked about how he has good family support and his internal spiritual resources and faith are integral to his character and meaning in life. Chaplain provided prayer bedside and will continue to follow in order to provide spiritual support and to assess for spiritual need.   Rev. Jolyn Lent, M.Div. Chaplain

## 2023-02-16 NOTE — Progress Notes (Signed)
Pt was lethargic first half of shift. He was noted to be febrile (100.9), tachpneic, and wheezing. Tylenol given. Overnight Dr Thomes Dinning was informed. One episode of stool overnight. He is more alert this AM. Personal sitter remains at bedside. Gerald Hall

## 2023-02-17 ENCOUNTER — Telehealth: Payer: Self-pay | Admitting: Orthopedic Surgery

## 2023-02-17 DIAGNOSIS — I482 Chronic atrial fibrillation, unspecified: Secondary | ICD-10-CM | POA: Diagnosis not present

## 2023-02-17 DIAGNOSIS — J189 Pneumonia, unspecified organism: Secondary | ICD-10-CM | POA: Diagnosis not present

## 2023-02-17 DIAGNOSIS — E0590 Thyrotoxicosis, unspecified without thyrotoxic crisis or storm: Secondary | ICD-10-CM | POA: Diagnosis not present

## 2023-02-17 DIAGNOSIS — I5022 Chronic systolic (congestive) heart failure: Secondary | ICD-10-CM | POA: Diagnosis not present

## 2023-02-17 LAB — CULTURE, BLOOD (ROUTINE X 2)
Culture: NO GROWTH
Special Requests: ADEQUATE

## 2023-02-17 LAB — LEGIONELLA PNEUMOPHILA SEROGP 1 UR AG: L. pneumophila Serogp 1 Ur Ag: NEGATIVE

## 2023-02-17 MED ORDER — METRONIDAZOLE 500 MG PO TABS
500.0000 mg | ORAL_TABLET | Freq: Two times a day (BID) | ORAL | Status: DC
  Administered 2023-02-17 – 2023-02-18 (×3): 500 mg via ORAL
  Filled 2023-02-17 (×3): qty 1

## 2023-02-17 MED ORDER — IPRATROPIUM-ALBUTEROL 0.5-2.5 (3) MG/3ML IN SOLN
3.0000 mL | Freq: Two times a day (BID) | RESPIRATORY_TRACT | Status: DC
  Administered 2023-02-18: 3 mL via RESPIRATORY_TRACT
  Filled 2023-02-17: qty 3

## 2023-02-17 NOTE — Telephone Encounter (Signed)
Pt is currently inpatient at [facility], please advise.

## 2023-02-17 NOTE — Progress Notes (Signed)
Physical Therapy Treatment Patient Details Name: Gerald Hall MRN: [id number] DOB: July 15, 1935 Today's Date: 02/17/2023   History of Present Illness Gerald Hall is an 88 y.o. male with medical history significant of COPD on supplemental oxygen via [place] at 2 LPM at baseline, atrial fibrillation, CKD 3, BPH, HFrEF (echo done in February 2024 showed LVEF of 30 to 35%) who presents to the emergency department from cardiology office due to being somnolent during the office visit.  Patient was admitted from 4/32/12 due to closed displaced fracture of the left femoral neck s/p left hip hemiarthroplasty and was discharged to a SNF.  Most of the history was obtained from son-in-law at bedside.  Per report, patient has not been able to return to his baseline of functioning since the surgery.  He used to be able to to take care of some of his ADLs, but now requires maximal assist to do almost anything, his mental cognition was also said to have declined and family was worried about possible stroke.  Patient was also reported not to have been able to stand or ambulate since the surgery (different from baseline).  He has several days of nonproductive cough with chest congestion.    PT Comments    Pt seated with daughter visiting in room, friendly and willing to participate with PT today.  Pt given medication prior session for LBP and Lt hip pain that was monitored through session.  Pt limited by weakness and fatigue as well as pain with weight bearing in hip.  Able to take small steps forward and backward though unable to go further for safety as fatigued quickly.  Sit to stands complete 5 times with moderate cueing for handplacement for safety with eccentric control while standing.  Able to stand for 1 minute duration prior need to sit.  EOS pt left in chair with daughter in room.    Recommendations for follow up therapy are one component of a multi-disciplinary discharge planning process, led by the attending  physician.  Recommendations may be updated based on patient status, additional functional criteria and insurance authorization.  Follow Up Recommendations       Assistance Recommended at Discharge    Patient can return home with the following     Equipment Recommendations       Recommendations for Other Services       Precautions / Restrictions Precautions Precautions: Fall Precaution Comments: Posterior hip precautions. Restrictions Weight Bearing Restrictions: Yes LLE Weight Bearing: Weight bearing as tolerated     Mobility  Bed Mobility                    Transfers                        Ambulation/Gait                   Stairs             Wheelchair Mobility    Modified Rankin (Stroke Patients Only)       Balance                                            Cognition  Exercises Other Exercises Other Exercises: 5 STS working on mechaincs for proper hand placements, scooting to EOC, nose over toes, glut squeeze to standing erect; proper hand placement prior sitting to assist with eccentric control Other Exercises: Seated scapular retraction 20x    General Comments        Pertinent Vitals/Pain Pain Assessment Pain Score: 5  Pain Location: L hip weight bearing only Pain Descriptors / Indicators: Discomfort Pain Intervention(s): Monitored during session, Limited activity within patient's tolerance, Repositioned    Home Living                          Prior Function            PT Goals (current goals can now be found in the care plan section)      Frequency           PT Plan Current plan remains appropriate    Co-evaluation              AM-PAC PT "6 Clicks" Mobility   Outcome Measure  Help needed turning from your back to your side while in a flat bed without using bedrails?: A Lot Help needed  moving from lying on your back to sitting on the side of a flat bed without using bedrails?: A Lot Help needed moving to and from a bed to a chair (including a wheelchair)?: A Lot Help needed standing up from a chair using your arms (e.g., wheelchair or bedside chair)?: A Lot Help needed to walk in hospital room?: A Lot Help needed climbing 3-5 steps with a railing? : Total 6 Click Score: 11    End of Session Equipment Utilized During Treatment: Oxygen;Gait belt Activity Tolerance: Patient tolerated treatment well;Patient limited by fatigue Patient left: in chair;with call bell/phone within reach;with family/visitor present Nurse Communication: Mobility status PT Visit Diagnosis: Unsteadiness on feet (R26.81);Other abnormalities of gait and mobility (R26.89);Muscle weakness (generalized) (M62.81);Pain     Time: 1610-9604 PT Time Calculation (min) (ACUTE ONLY): 27 min  Charges:  $Therapeutic Activity: 23-37 mins                    Becky Sax, LPTA/CLT; CBIS [phone]  Juel Burrow 02/17/2023, 12:46 PM

## 2023-02-17 NOTE — Progress Notes (Signed)
Interventional Radiology Brief Note:  ESI requested for lumbar pain management.  Patient does received Ortho-related care through [facility].  Recommend outpatient appointment with either Ortho or at [facility].   MD aware.   Loyce Dys, MS RD PA-C

## 2023-02-17 NOTE — Progress Notes (Signed)
PROGRESS NOTE    Gerald Hall  ZOX:[id number] DOB: 15-Jan-1935 DOA: 02/14/2023 PCP: Babs Sciara, MD   Brief Narrative: Gerald Hall is a 88 y.o. male with a history of COPD, chronic respiratory failure, atrial fibrillation, CKD stage III, BPH, HFrEF. Patient presented from her cardiologist's office secondary somnolence and was found to have evidence of pneumonia. Empiric Vancomycin and Cefepime started on admission. Symptoms improving.   Assessment and Plan:  Aspiration pneumonia Patient started empirically on Vancomycin/Cefepime with improvement of symptoms. Leukocytosis improved. Vancomycin discontinued -Continue Cefepime; add flagyl -Transition to Cefdinir on day of discharge  Chronic respiratory failure with hypoxia Patient stable on home oxygen at 2 L/min  Abnormal CT Concern for possible subacute/acute infarcts on CT scan. MRI ruled out stroke.  Recent left hip arthroplasty Generalized weakness Patient evaluated by PT/OT. Recommendation for discharge to SNF.  Prolonged QT interval Resolved.  Hypokalemia Resolved with potassium supplementation.  Mild malnutrition -Continue feeding supplementation  Stage IIIa Stable.  COPD Stable. -Continue Duonebs  Chronic atrial fibrillation History of PSVT Patient is managed on amiodarone and Coreg as an outpatient. Coreg continued and amiodarone held secondary to prolonged QTc. QTc improved to 468 msec. Per cardiology notes, recommendation to reduce to amiodarone 100 mg daily. Currently in sinus rhythm -Continue Coreg -Continue decreased dose of amiodarone 100 mg daily  Prolonged PR interval Appears to be chronic. Patient is on Coreg. Noted by cardiology on recent office visit notes. No bradycardia.  Chronic HFrEF Stable. LVEF of 30-35% from Transthoracic Echocardiogram in 11/2022 with associated grade 1 diastolic dysfunction.  Hyperthyroidism -Continue methimazole  Restless leg syndrome -Continue  Requip  Obesity Estimated body mass index is 31.93 kg/m as calculated from the following:   Height as of this encounter:  (1.727 m).   Weight as of this encounter: 95.2 kg.    DVT prophylaxis: SCDs Code Status:   Code Status: DNR Family Communication: Daughter at bedside Disposition Plan: Discharge to SNF in AM pending transportation   Consultants:  None  Procedures:  None  Antimicrobials: Vancomycin Cefepime    Subjective: No issues this morning.  Objective: BP (!) 136/58 (BP Location: Left Arm)   Pulse 71   Temp 98.4 F (36.9 C) (Axillary)   Resp 18   Ht  (1.727 m)   Wt 95.2 kg   SpO2 95%   BMI 31.93 kg/m   Examination:  General exam: Appears calm and comfortable Respiratory system: Clear to auscultation. Respiratory effort normal. Cardiovascular system: S1 & S2 heard, RRR. Gastrointestinal system: Abdomen is nondistended, soft and nontender. Normal bowel sounds heard. Central nervous system: Alert and oriented. No focal neurological deficits. Musculoskeletal: No calf tenderness Skin: No cyanosis. No rashes   Data Reviewed: I have personally reviewed following labs and imaging studies  CBC Lab Results  Component Value Date   WBC 9.4 02/16/2023   RBC 3.63 (L) 02/16/2023   HGB 10.8 (L) 02/16/2023   HCT 36.4 (L) 02/16/2023   MCV 100.3 (H) 02/16/2023   MCH 29.8 02/16/2023   PLT 217 02/16/2023   MCHC 29.7 (L) 02/16/2023   RDW 14.4 02/16/2023   LYMPHSABS 1.3 02/14/2023   MONOABS 1.5 (H) 02/14/2023   EOSABS 0.4 02/14/2023   BASOSABS 0.0 02/14/2023     Last metabolic panel Lab Results  Component Value Date   NA 142 02/16/2023   K 3.5 02/16/2023   CL 108 02/16/2023   CO2 26 02/16/2023   BUN 26 (H) 02/16/2023   CREATININE  1.38 (H) 02/16/2023   GLUCOSE 104 (H) 02/16/2023   GFRNONAA 50 (L) 02/16/2023   GFRAA 48 (L) 11/24/2020   CALCIUM 8.6 (L) 02/16/2023   PHOS 3.0 02/16/2023   PROT 5.8 (L) 02/16/2023   ALBUMIN 2.2 (L)  02/16/2023   BILITOT 0.8 02/16/2023   ALKPHOS 81 02/16/2023   AST 10 (L) 02/16/2023   ALT 13 02/16/2023   ANIONGAP 8 02/16/2023    GFR: Estimated Creatinine Clearance: 43.8 mL/min (A) (by C-G formula based on SCr of 1.38 mg/dL (H)).  Recent Results (from the past 240 hour(s))  Blood culture (routine x 2)     Status: None (Preliminary result)   Collection Time: 02/14/23  9:27 PM   Specimen: BLOOD  Result Value Ref Range Status   Specimen Description BLOOD RIGHT ANTECUBITAL  Final   Special Requests   Final    BOTTLES DRAWN AEROBIC AND ANAEROBIC Blood Culture adequate volume   Culture   Final    NO GROWTH 3 DAYS Performed at [facility], [address], [place], [place] [address]    Report Status PENDING  Incomplete  Blood culture (routine x 2)     Status: None (Preliminary result)   Collection Time: 02/14/23  9:28 PM   Specimen: BLOOD  Result Value Ref Range Status   Specimen Description BLOOD BLOOD RIGHT HAND  Final   Special Requests   Final    BOTTLES DRAWN AEROBIC AND ANAEROBIC Blood Culture adequate volume   Culture   Final    NO GROWTH 3 DAYS Performed at [facility], [address], [place], [place] [address]    Report Status PENDING  Incomplete  MRSA Next Gen by PCR, Nasal     Status: None   Collection Time: 02/16/23 10:25 AM   Specimen: Nasal Mucosa; Nasal Swab  Result Value Ref Range Status   MRSA by PCR Next Gen NOT DETECTED NOT DETECTED Final    Comment: (NOTE) The GeneXpert MRSA Assay (FDA approved for NASAL specimens only), is one component of a comprehensive MRSA colonization surveillance program. It is not intended to diagnose MRSA infection nor to guide or monitor treatment for MRSA infections. Test performance is not FDA approved in patients less than 24 years old. Performed at [facility], [address] [place], [place] [address]       Radiology Studies: No results found.    LOS: 3 days    Jacquelin Hawking, MD [facility] 02/17/2023,  1:38 PM   If 7PM-7AM, please contact night-coverage [email or web address]

## 2023-02-17 NOTE — TOC Progression Note (Addendum)
Transition of Care [facility]) - Progression Note    Patient Details  Name: Gerald Hall MRN: [id number] Date of Birth: Jul 11, 1935  [facility]) CM/SW Contact  Elliot Gault, LCSW Phone Number: 02/17/2023, 1:37 PM  Clinical Narrative:     TOC following. Pt has received a bed offer from [facility] in [place]. Discussed with daughter, Gerald Hall, who accepts bed offer. Another family member is completing admission paperwork at the SNF today and insurance was updated on facility selected.   Pt has auth from insurance and transport arranged with [facility] for tomorrow afternoon. Family will private pay for the transport and estimated cost was provided to dtr. She will follow up with [facility] to provide payment information.  Updated Tiffany at [facility] that pt would not admit to them after all.  Will follow up in AM.  Expected Discharge Plan: Skilled Nursing Facility Barriers to Discharge: Continued Medical Work up  Expected Discharge Plan and Services In-house Referral: Clinical Social Work   Post Acute Care Choice: Skilled Nursing Facility                                         Social Determinants of Health (SDOH) Interventions SDOH Screenings   Food Insecurity: No Food Insecurity (02/15/2023)  Housing: Low Risk  (02/15/2023)  Transportation Needs: No Transportation Needs (02/15/2023)  Utilities: Not At Risk (02/15/2023)  Alcohol Screen: Low Risk  (03/16/2022)  Depression (PHQ2-9): Low Risk  (01/12/2023)  Financial Resource Strain: Low Risk  (03/16/2022)  Physical Activity: Sufficiently Active (03/16/2022)  Social Connections: Moderately Integrated (03/16/2022)  Stress: No Stress Concern Present (03/16/2022)  Tobacco Use: High Risk (02/14/2023)    Readmission Risk Interventions     No data to display

## 2023-02-17 NOTE — Telephone Encounter (Signed)
Dr. Dallas Schimke pt - [facility] [phone], pts SIN lvm stating he needs to speak to someone regarding the patient's pain management.

## 2023-02-17 NOTE — Telephone Encounter (Signed)
Spoke with patient's son in law and informed him that Dr. Alvester Morin cannot come to the hospital to do a back injection. He stated the patient will possibly get discharged today, but had a hip replacement on 01/27/23. Informed him that he would have to contact the ortho surgeon to see if he can get a back injection due to surgery. Verbalized understanding

## 2023-02-17 NOTE — Progress Notes (Signed)
PT Cancellation Note  Patient Details Name: JERIMIAH WOLMAN MRN: [id number] DOB: [birth year]   Cancelled Treatment:    Reason Eval/Treat Not Completed: Pain limiting ability to participate Pt requested pain medication prior PT session.  RN aware.  Will attempt later if available.  Becky Sax, LPTA/CLT; CBIS [phone]  Juel Burrow 02/17/2023, 8:39 AM

## 2023-02-18 ENCOUNTER — Inpatient Hospital Stay (HOSPITAL_COMMUNITY): Payer: Medicare Other

## 2023-02-18 ENCOUNTER — Ambulatory Visit: Payer: Medicare Other | Admitting: Family Medicine

## 2023-02-18 DIAGNOSIS — R262 Difficulty in walking, not elsewhere classified: Secondary | ICD-10-CM | POA: Diagnosis not present

## 2023-02-18 DIAGNOSIS — H353230 Exudative age-related macular degeneration, bilateral, stage unspecified: Secondary | ICD-10-CM | POA: Diagnosis not present

## 2023-02-18 DIAGNOSIS — R0602 Shortness of breath: Secondary | ICD-10-CM | POA: Diagnosis not present

## 2023-02-18 DIAGNOSIS — E871 Hypo-osmolality and hyponatremia: Secondary | ICD-10-CM | POA: Diagnosis not present

## 2023-02-18 DIAGNOSIS — I509 Heart failure, unspecified: Secondary | ICD-10-CM | POA: Diagnosis not present

## 2023-02-18 DIAGNOSIS — I639 Cerebral infarction, unspecified: Secondary | ICD-10-CM | POA: Diagnosis not present

## 2023-02-18 DIAGNOSIS — I6781 Acute cerebrovascular insufficiency: Secondary | ICD-10-CM | POA: Diagnosis not present

## 2023-02-18 DIAGNOSIS — J439 Emphysema, unspecified: Secondary | ICD-10-CM | POA: Diagnosis not present

## 2023-02-18 DIAGNOSIS — E039 Hypothyroidism, unspecified: Secondary | ICD-10-CM | POA: Diagnosis not present

## 2023-02-18 DIAGNOSIS — R2681 Unsteadiness on feet: Secondary | ICD-10-CM | POA: Diagnosis not present

## 2023-02-18 DIAGNOSIS — I5033 Acute on chronic diastolic (congestive) heart failure: Secondary | ICD-10-CM | POA: Diagnosis not present

## 2023-02-18 DIAGNOSIS — R918 Other nonspecific abnormal finding of lung field: Secondary | ICD-10-CM | POA: Diagnosis not present

## 2023-02-18 DIAGNOSIS — E1122 Type 2 diabetes mellitus with diabetic chronic kidney disease: Secondary | ICD-10-CM | POA: Diagnosis not present

## 2023-02-18 DIAGNOSIS — R296 Repeated falls: Secondary | ICD-10-CM | POA: Diagnosis not present

## 2023-02-18 DIAGNOSIS — Z66 Do not resuscitate: Secondary | ICD-10-CM | POA: Diagnosis not present

## 2023-02-18 DIAGNOSIS — M4802 Spinal stenosis, cervical region: Secondary | ICD-10-CM | POA: Diagnosis not present

## 2023-02-18 DIAGNOSIS — E0590 Thyrotoxicosis, unspecified without thyrotoxic crisis or storm: Secondary | ICD-10-CM | POA: Diagnosis not present

## 2023-02-18 DIAGNOSIS — J9621 Acute and chronic respiratory failure with hypoxia: Secondary | ICD-10-CM | POA: Diagnosis not present

## 2023-02-18 DIAGNOSIS — R7989 Other specified abnormal findings of blood chemistry: Secondary | ICD-10-CM | POA: Diagnosis not present

## 2023-02-18 DIAGNOSIS — J9622 Acute and chronic respiratory failure with hypercapnia: Secondary | ICD-10-CM | POA: Diagnosis not present

## 2023-02-18 DIAGNOSIS — R0989 Other specified symptoms and signs involving the circulatory and respiratory systems: Secondary | ICD-10-CM | POA: Diagnosis not present

## 2023-02-18 DIAGNOSIS — E860 Dehydration: Secondary | ICD-10-CM | POA: Diagnosis not present

## 2023-02-18 DIAGNOSIS — M25462 Effusion, left knee: Secondary | ICD-10-CM | POA: Diagnosis not present

## 2023-02-18 DIAGNOSIS — M25461 Effusion, right knee: Secondary | ICD-10-CM | POA: Diagnosis not present

## 2023-02-18 DIAGNOSIS — E876 Hypokalemia: Secondary | ICD-10-CM | POA: Diagnosis not present

## 2023-02-18 DIAGNOSIS — R0689 Other abnormalities of breathing: Secondary | ICD-10-CM | POA: Diagnosis not present

## 2023-02-18 DIAGNOSIS — I6529 Occlusion and stenosis of unspecified carotid artery: Secondary | ICD-10-CM | POA: Diagnosis not present

## 2023-02-18 DIAGNOSIS — R0609 Other forms of dyspnea: Secondary | ICD-10-CM | POA: Diagnosis not present

## 2023-02-18 DIAGNOSIS — I340 Nonrheumatic mitral (valve) insufficiency: Secondary | ICD-10-CM | POA: Diagnosis not present

## 2023-02-18 DIAGNOSIS — I4891 Unspecified atrial fibrillation: Secondary | ICD-10-CM | POA: Diagnosis not present

## 2023-02-18 DIAGNOSIS — I482 Chronic atrial fibrillation, unspecified: Secondary | ICD-10-CM | POA: Diagnosis not present

## 2023-02-18 DIAGNOSIS — R0902 Hypoxemia: Secondary | ICD-10-CM | POA: Diagnosis not present

## 2023-02-18 DIAGNOSIS — J189 Pneumonia, unspecified organism: Secondary | ICD-10-CM | POA: Diagnosis not present

## 2023-02-18 DIAGNOSIS — W06XXXA Fall from bed, initial encounter: Secondary | ICD-10-CM | POA: Diagnosis not present

## 2023-02-18 DIAGNOSIS — N1831 Chronic kidney disease, stage 3a: Secondary | ICD-10-CM | POA: Diagnosis not present

## 2023-02-18 DIAGNOSIS — M1712 Unilateral primary osteoarthritis, left knee: Secondary | ICD-10-CM | POA: Diagnosis not present

## 2023-02-18 DIAGNOSIS — S72002A Fracture of unspecified part of neck of left femur, initial encounter for closed fracture: Secondary | ICD-10-CM | POA: Diagnosis not present

## 2023-02-18 DIAGNOSIS — I129 Hypertensive chronic kidney disease with stage 1 through stage 4 chronic kidney disease, or unspecified chronic kidney disease: Secondary | ICD-10-CM | POA: Diagnosis not present

## 2023-02-18 DIAGNOSIS — Z96649 Presence of unspecified artificial hip joint: Secondary | ICD-10-CM

## 2023-02-18 DIAGNOSIS — I480 Paroxysmal atrial fibrillation: Secondary | ICD-10-CM | POA: Diagnosis not present

## 2023-02-18 DIAGNOSIS — R519 Headache, unspecified: Secondary | ICD-10-CM | POA: Diagnosis not present

## 2023-02-18 DIAGNOSIS — M25562 Pain in left knee: Secondary | ICD-10-CM | POA: Diagnosis not present

## 2023-02-18 DIAGNOSIS — H90A21 Sensorineural hearing loss, unilateral, right ear, with restricted hearing on the contralateral side: Secondary | ICD-10-CM | POA: Diagnosis not present

## 2023-02-18 DIAGNOSIS — E870 Hyperosmolality and hypernatremia: Secondary | ICD-10-CM | POA: Diagnosis not present

## 2023-02-18 DIAGNOSIS — G8911 Acute pain due to trauma: Secondary | ICD-10-CM | POA: Diagnosis not present

## 2023-02-18 DIAGNOSIS — Z7901 Long term (current) use of anticoagulants: Secondary | ICD-10-CM | POA: Diagnosis not present

## 2023-02-18 DIAGNOSIS — J449 Chronic obstructive pulmonary disease, unspecified: Secondary | ICD-10-CM | POA: Diagnosis not present

## 2023-02-18 DIAGNOSIS — I471 Supraventricular tachycardia, unspecified: Secondary | ICD-10-CM | POA: Diagnosis not present

## 2023-02-18 DIAGNOSIS — S7292XD Unspecified fracture of left femur, subsequent encounter for closed fracture with routine healing: Secondary | ICD-10-CM | POA: Diagnosis not present

## 2023-02-18 DIAGNOSIS — E119 Type 2 diabetes mellitus without complications: Secondary | ICD-10-CM | POA: Diagnosis not present

## 2023-02-18 DIAGNOSIS — Z9981 Dependence on supplemental oxygen: Secondary | ICD-10-CM | POA: Diagnosis not present

## 2023-02-18 DIAGNOSIS — W19XXXA Unspecified fall, initial encounter: Secondary | ICD-10-CM | POA: Diagnosis not present

## 2023-02-18 DIAGNOSIS — Z7982 Long term (current) use of aspirin: Secondary | ICD-10-CM | POA: Diagnosis not present

## 2023-02-18 DIAGNOSIS — I429 Cardiomyopathy, unspecified: Secondary | ICD-10-CM | POA: Diagnosis not present

## 2023-02-18 DIAGNOSIS — N189 Chronic kidney disease, unspecified: Secondary | ICD-10-CM | POA: Diagnosis not present

## 2023-02-18 DIAGNOSIS — M25561 Pain in right knee: Secondary | ICD-10-CM | POA: Diagnosis not present

## 2023-02-18 DIAGNOSIS — I2510 Atherosclerotic heart disease of native coronary artery without angina pectoris: Secondary | ICD-10-CM | POA: Diagnosis not present

## 2023-02-18 DIAGNOSIS — Z7951 Long term (current) use of inhaled steroids: Secondary | ICD-10-CM | POA: Diagnosis not present

## 2023-02-18 DIAGNOSIS — S0990XA Unspecified injury of head, initial encounter: Secondary | ICD-10-CM | POA: Diagnosis not present

## 2023-02-18 DIAGNOSIS — J432 Centrilobular emphysema: Secondary | ICD-10-CM | POA: Diagnosis not present

## 2023-02-18 DIAGNOSIS — R338 Other retention of urine: Secondary | ICD-10-CM | POA: Diagnosis not present

## 2023-02-18 DIAGNOSIS — Z20822 Contact with and (suspected) exposure to covid-19: Secondary | ICD-10-CM | POA: Diagnosis not present

## 2023-02-18 DIAGNOSIS — I5022 Chronic systolic (congestive) heart failure: Secondary | ICD-10-CM | POA: Diagnosis not present

## 2023-02-18 DIAGNOSIS — F17210 Nicotine dependence, cigarettes, uncomplicated: Secondary | ICD-10-CM | POA: Diagnosis not present

## 2023-02-18 DIAGNOSIS — Z79899 Other long term (current) drug therapy: Secondary | ICD-10-CM | POA: Diagnosis not present

## 2023-02-18 DIAGNOSIS — R400 Somnolence: Secondary | ICD-10-CM | POA: Diagnosis not present

## 2023-02-18 DIAGNOSIS — I2584 Coronary atherosclerosis due to calcified coronary lesion: Secondary | ICD-10-CM | POA: Diagnosis not present

## 2023-02-18 DIAGNOSIS — J9611 Chronic respiratory failure with hypoxia: Secondary | ICD-10-CM | POA: Diagnosis not present

## 2023-02-18 LAB — CULTURE, BLOOD (ROUTINE X 2): Culture: NO GROWTH

## 2023-02-18 MED ORDER — CEFDINIR 300 MG PO CAPS: 300.0000 mg | ORAL_CAPSULE | Freq: Two times a day (BID) | ORAL | Status: AC

## 2023-02-18 MED ORDER — AMIODARONE HCL 100 MG PO TABS: 100.0000 mg | ORAL_TABLET | Freq: Every day | ORAL | Status: DC

## 2023-02-18 MED ORDER — GLUCERNA SHAKE PO LIQD: 237.0000 mL | Freq: Three times a day (TID) | ORAL | 0 refills | Status: DC

## 2023-02-18 MED ORDER — PREGABALIN 25 MG PO CAPS: 25.0000 mg | ORAL_CAPSULE | Freq: Two times a day (BID) | ORAL | 0 refills | Status: DC

## 2023-02-18 MED ORDER — METRONIDAZOLE 500 MG PO TABS: 500.0000 mg | ORAL_TABLET | Freq: Two times a day (BID) | ORAL | Status: AC

## 2023-02-18 MED ORDER — CLONAZEPAM 0.5 MG PO TABS: 0.5000 mg | ORAL_TABLET | Freq: Every evening | ORAL | 0 refills | Status: DC | PRN

## 2023-02-18 MED ORDER — CEFDINIR 300 MG PO CAPS
300.0000 mg | ORAL_CAPSULE | Freq: Two times a day (BID) | ORAL | Status: DC
  Administered 2023-02-18: 300 mg via ORAL
  Filled 2023-02-18: qty 1

## 2023-02-18 MED ORDER — TRAMADOL HCL 50 MG PO TABS: 50.0000 mg | ORAL_TABLET | Freq: Three times a day (TID) | ORAL | Status: DC | PRN

## 2023-02-18 MED ORDER — TRAMADOL HCL 50 MG PO TABS: 50.0000 mg | ORAL_TABLET | Freq: Three times a day (TID) | ORAL | 0 refills | Status: DC | PRN

## 2023-02-18 NOTE — Care Management Important Message (Signed)
Important Message  Patient Details  Name: Gerald Hall MRN: [id number] Date of Birth: 13-Jan-1935   Medicare Important Message Given:  Yes     [name] 02/18/2023, 10:10 AM

## 2023-02-18 NOTE — TOC Transition Note (Signed)
Transition of Care [facility]) - CM/SW Discharge Note   Patient Details  Name: Gerald Hall MRN: [id number] Date of Birth: February 05, 1935  Transition of Care [facility]) CM/SW Contact:  Elliot Gault, LCSW Phone Number: 02/18/2023, 11:33 AM   Clinical Narrative:     Pt stable for dc per MD. Admissions at [facility] [place] aware of dc and ready to admit pt.   Dtr, Harriett Sine, states that they will transport pt. TOC cancelled w/c Zenaida Niece transport with [facility].   DC clinical sent electronically. RN to call report.  There are no other TOC needs for dc.  Final next level of care: Skilled Nursing Facility Barriers to Discharge: Barriers Resolved   Patient Goals and CMS Choice CMS Medicare.gov Compare Post Acute Care list provided to:: Patient Represenative (must comment) Choice offered to / list presented to : Adult Children  Discharge Placement                Patient chooses bed at: Other - please specify in the comment section below: [facility]) Patient to be transferred to facility by: family car Name of family member notified: Harriett Sine Patient and family notified of of transfer: 02/18/23  Discharge Plan and Services Additional resources added to the After Visit Summary for   In-house Referral: Clinical Social Work   Post Acute Care Choice: Skilled Nursing Facility                               Social Determinants of Health (SDOH) Interventions SDOH Screenings   Food Insecurity: No Food Insecurity (02/15/2023)  Housing: Low Risk  (02/15/2023)  Transportation Needs: No Transportation Needs (02/15/2023)  Utilities: Not At Risk (02/15/2023)  Alcohol Screen: Low Risk  (03/16/2022)  Depression (PHQ2-9): Low Risk  (01/12/2023)  Financial Resource Strain: Low Risk  (03/16/2022)  Physical Activity: Sufficiently Active (03/16/2022)  Social Connections: Moderately Integrated (03/16/2022)  Stress: No Stress Concern Present (03/16/2022)  Tobacco Use: High Risk  (02/14/2023)     Readmission Risk Interventions     No data to display

## 2023-02-18 NOTE — Discharge Instructions (Signed)
Gerald Hall,  You were in the hospital being treated for pneumonia. Thankfully, this has improved with antibiotics. Please follow-up with your PCP for a hospital follow-up when able (preferably in 1 week)

## 2023-02-18 NOTE — Progress Notes (Signed)
   ORTHOPAEDIC PROGRESS NOTE  s/p left hip hemiarthroplasty  DOS: 01/27/2023  SUBJECTIVE: Patient feeling better following treatment for pneumonia.  Some pain in the groin.  Most of his pain is in his buttock.  No numbness or tingling.  Plan for DC today.  OBJECTIVE: PE:  Alert and oriented  On O2 at baseline Lateral hip incision is clean, dry and intact.  Staples have been removed.  Intact sensation in the foot Active motion in the EHL/TA Pain in his knee with axial loading Tolerates gentle range of motion in the hip  Vitals:   02/18/23 0643 02/18/23 0737  BP: 136/68   Pulse: 64 70  Resp: 18 18  Temp: 98.4 F (36.9 C)   SpO2: 96% 95%     ASSESSMENT: Gerald Hall is a 88 y.o. male left hip doing well.  Will get updated XR.  Encouraged him to continue ambulating.  Follow up in about 3 weeks, appointment already scheduled.   PLAN: Weightbearing: WBAT LLE Insicional and dressing care: Reinforce dressings as needed Orthopedic device(s): None VTE prophylaxis:  Discretion of primary team.  Recommend 81 mg aspirin BID Pain control: As needed; family will contact clinic if considering an epidural  Follow - up plan:  3-4 weeks   Contact information:     Byrdie Miyazaki A. Dallas Schimke, MD MS [facility] [address] [place],  [place]  [address] Phone: [phone] Fax: [phone]

## 2023-02-18 NOTE — Progress Notes (Signed)
Patient discharged from facility in stable condition accompanied by children transporting to accepting facility POV. All documentation given and family verbally expressed understanding, verbal report called in to accepting facility.

## 2023-02-18 NOTE — Discharge Summary (Signed)
Physician Discharge Summary   Patient: Gerald Hall MRN: [id number] DOB: 02/18/1935  Admit date:     02/14/2023  Discharge date: 02/18/23  Discharge Physician: Jacquelin Hawking, MD   PCP: Babs Sciara, MD   Recommendations at discharge:  PCP follow-up in 1 week  Discharge Diagnoses: Principal Problem:   CAP (community acquired pneumonia) Active Problems:   Chronic HFrEF (heart failure with reduced ejection fraction) (HCC)   Atrial fibrillation, chronic (HCC)   Hyperthyroidism   CKD stage 3a, GFR 45-59 ml/min (HCC)   Restless legs   Hypokalemia   Acute ischemic stroke (HCC)   Generalized weakness   Ambulatory dysfunction  Resolved Problems:   Prolonged Q-T interval on ECG  Hospital Course: Gerald Hall is a 88 y.o. male with a history of COPD, chronic respiratory failure, atrial fibrillation, CKD stage III, BPH, HFrEF. Patient presented from her cardiologist's office secondary somnolence and was found to have evidence of pneumonia. Empiric Vancomycin and Cefepime started on admission. Symptoms improving.  Assessment and Plan:  Aspiration pneumonia Patient started empirically on Vancomycin/Cefepime with improvement of symptoms. Leukocytosis improved. Vancomycin discontinued. Flagyl added to cover for possible anaerobic bacteria. Patient discharged on Cefdinir and Flagyl to complete antibiotic course.   Chronic respiratory failure with hypoxia Patient stable on home oxygen at 2 L/min   Abnormal CT Concern for possible subacute/acute infarcts on CT scan. MRI ruled out stroke.   Recent left hip arthroplasty Generalized weakness Patient evaluated by PT/OT. Recommendation for discharge to SNF. Per prior recommendations, will discharge on aspirin 81 mg BID for DVT prophylaxis.   Prolonged QT interval Resolved.   Hypokalemia Resolved with potassium supplementation.   Mild malnutrition Continue protein supplementation.   Stage IIIa Stable.   COPD Stable. Continue  Duonebs.   Chronic atrial fibrillation History of PSVT Patient is managed on amiodarone and Coreg as an outpatient. Coreg continued and amiodarone held secondary to prolonged QTc. QTc improved to 468 msec. Per cardiology notes, recommendation to reduce to amiodarone 100 mg daily. Currently in sinus rhythm. Continue Coreg. Amiodarone changed to amiodarone 100 mg daily on discharge.   Prolonged PR interval Appears to be chronic. Patient is on Coreg. Noted by cardiology on recent office visit notes. No bradycardia.   Chronic HFrEF Stable. LVEF of 30-35% from Transthoracic Echocardiogram in 11/2022 with associated grade 1 diastolic dysfunction.   Hyperthyroidism Continue methimazole.   Restless leg syndrome Continue Requip.   Obesity Estimated body mass index is 31.93 kg/m as calculated from the following:   Height as of this encounter: 5\' 8"  (1.727 m).   Weight as of this encounter: 95.2 kg.   Consultants: None Procedures performed: None  Disposition: Skilled nursing facility Diet recommendation: Cardiac diet   DISCHARGE MEDICATION: Allergies as of 02/18/2023       Reactions   Cephalexin Diarrhea   And general weakness   Penicillins Other (See Comments)   GI UPSET        Medication List     STOP taking these medications    nitrofurantoin (macrocrystal-monohydrate) 100 MG capsule Commonly known as: MACROBID   oxyCODONE 5 MG immediate release tablet Commonly known as: Roxicodone       TAKE these medications    acetaminophen 500 MG tablet Commonly known as: TYLENOL Take 1,000 mg by mouth every 6 (six) hours as needed for mild pain or headache.   albuterol 108 (90 Base) MCG/ACT inhaler Commonly known as: VENTOLIN HFA Inhale 2 puffs into the lungs every 4 (four)  hours as needed for wheezing.   amiodarone 100 MG tablet Commonly known as: PACERONE Take 1 tablet (100 mg total) by mouth daily. What changed:  medication strength how much to take   ascorbic  acid 500 MG tablet Commonly known as: VITAMIN C Take 500 mg by mouth daily.   aspirin EC 81 MG tablet Take 1 tablet (81 mg total) by mouth 2 (two) times daily. Swallow whole.   budesonide-formoterol 160-4.5 MCG/ACT inhaler Commonly known as: Symbicort Inhale 2 puffs into the lungs 2 (two) times daily.   calcium carbonate 600 MG Tabs tablet Commonly known as: OS-CAL Take 600 mg by mouth daily with breakfast.   carvedilol 3.125 MG tablet Commonly known as: COREG Take 1 tablet (3.125 mg total) by mouth 2 (two) times daily with a meal.   cefdinir 300 MG capsule Commonly known as: OMNICEF Take 1 capsule (300 mg total) by mouth every 12 (twelve) hours for 3 days.   cholecalciferol 10 MCG (400 UNIT) Tabs tablet Commonly known as: VITAMIN D3 Take 1,000 Units by mouth daily.   clonazePAM 0.5 MG tablet Commonly known as: KLONOPIN Take 1 tablet (0.5 mg total) by mouth at bedtime as needed (insomnia).   feeding supplement (GLUCERNA SHAKE) Liqd Take 237 mLs by mouth 3 (three) times daily between meals.   finasteride 5 MG tablet Commonly known as: PROSCAR TAKE ONE TABLET BY MOUTH EVERY DAY   fluticasone 50 MCG/ACT nasal spray Commonly known as: FLONASE Place 2 sprays into both nostrils at bedtime.   ipratropium-albuterol 0.5-2.5 (3) MG/3ML Soln Commonly known as: DUONEB Take 3 mLs by nebulization every 4 (four) hours as needed. What changed: reasons to take this   Jardiance 10 MG Tabs tablet Generic drug: empagliflozin TAKE ONE TABLET BY MOUTH ONCE DAILY BEFORE BREAKFAST What changed: See the new instructions.   methimazole 5 MG tablet Commonly known as: TAPAZOLE TAKE ONE TABLET BY MOUTH EVERY DAY   metroNIDAZOLE 500 MG tablet Commonly known as: FLAGYL Take 1 tablet (500 mg total) by mouth every 12 (twelve) hours for 3 days.   mirabegron ER 50 MG Tb24 tablet Commonly known as: MYRBETRIQ Take 50 mg by mouth daily.   Mucinex DM 30-600 MG Tb12 Take 1 tablet by mouth  daily.   OXYGEN Inhale 2 L into the lungs continuous.   oxymetazoline 0.05 % nasal spray Commonly known as: AFRIN Place 1 spray into both nostrils at bedtime.   polyethylene glycol 17 g packet Commonly known as: MIRALAX / GLYCOLAX Take 17 g by mouth daily as needed for mild constipation.   polyvinyl alcohol 1.4 % ophthalmic solution Commonly known as: LIQUIFILM TEARS Place 1 drop into both eyes as needed for dry eyes.   pregabalin 25 MG capsule Commonly known as: Lyrica Take 1 capsule (25 mg total) by mouth 2 (two) times daily.   rOPINIRole 2 MG tablet Commonly known as: REQUIP Take 1 tablet (2 mg total) by mouth 2 (two) times daily.   tamsulosin 0.4 MG Caps capsule Commonly known as: FLOMAX TAKE (1) CAPSULE BY MOUTH TWICE DAILY. What changed: See the new instructions.   torsemide 10 MG tablet Commonly known as: DEMADEX Take 1 tablet (10 mg total) by mouth every other day.   traMADol 50 MG tablet Commonly known as: ULTRAM Take 1 tablet (50 mg total) by mouth 3 (three) times daily as needed for moderate pain or severe pain.   ZINC ACETATE PO Take 1 tablet by mouth daily.  Follow-up Information     Luking, Jonna Coup, MD. Schedule an appointment as soon as possible for a visit in 1 week(s).   Specialty: Family Medicine Why: For hospital follow-up Contact information: [address] [place] [address] [phone]                Discharge Exam: BP 136/68 (BP Location: Right Arm)   Pulse 70   Temp 98.4 F (36.9 C) (Oral)   Resp 18   Ht 5\' 8"  (1.727 m)   Wt 95.2 kg   SpO2 95%   BMI 31.93 kg/m   General exam: Appears calm and comfortable Respiratory system: Clear to auscultation. Respiratory effort normal. Cardiovascular system: S1 & S2 heard, RRR. Gastrointestinal system: Abdomen is slightly distended, soft and non-tender. Normal bowel sounds heard. Central nervous system: Alert and oriented. No focal neurological  deficits. Psychiatry: Judgement and insight appear normal. Mood & affect appropriate.   Condition at discharge: stable  The results of significant diagnostics from this hospitalization (including imaging, microbiology, ancillary and laboratory) are listed below for reference.   Imaging Studies: MR BRAIN WO CONTRAST  Result Date: 02/15/2023 CLINICAL DATA:  Neuro deficit, acute, stroke suspected. EXAM: MRI HEAD WITHOUT CONTRAST TECHNIQUE: Multiplanar, multiecho pulse sequences of the brain and surrounding structures were obtained without intravenous contrast. COMPARISON:  Brain MRI 08/08/2015. Head CT and CTA head/neck 02/14/2023. FINDINGS: Brain: No acute infarct or hemorrhage. Severe chronic small-vessel disease. Old lacunar infarct in the left thalamus. Prominent perivascular spaces throughout the bilateral basal ganglia and thalami (etat crible). No hydrocephalus or extra-axial collection. No mass or midline shift. Vascular: Normal flow voids. Skull and upper cervical spine: Normal marrow signal. Sinuses/Orbits: Air-fluid level in the left sphenoid sinus. Orbits are unremarkable. Other: Unchanged bilateral parotid masses, better evaluated on the prior CTA. IMPRESSION: 1. No acute intracranial abnormality. 2. Severe chronic small-vessel disease. Electronically Signed   By: Orvan Falconer M.D.   On: 02/15/2023 14:34   CT ANGIO HEAD NECK W WO CM  Result Date: 02/14/2023 CLINICAL DATA:  Initial evaluation for neuro deficit, stroke. EXAM: CT ANGIOGRAPHY HEAD AND NECK WITH AND WITHOUT CONTRAST TECHNIQUE: Multidetector CT imaging of the head and neck was performed using the standard protocol during bolus administration of intravenous contrast. Multiplanar CT image reconstructions and MIPs were obtained to evaluate the vascular anatomy. Carotid stenosis measurements (when applicable) are obtained utilizing NASCET criteria, using the distal internal carotid diameter as the denominator. RADIATION DOSE  REDUCTION: This exam was performed according to the departmental dose-optimization program which includes automated exposure control, adjustment of the mA and/or kV according to patient size and/or use of iterative reconstruction technique. CONTRAST:  75mL OMNIPAQUE IOHEXOL 350 MG/ML SOLN COMPARISON:  CT from earlier the same day. FINDINGS: CTA NECK FINDINGS Aortic arch: Visualized aortic arch normal in caliber with standard branch pattern. Atheromatous change about the arch itself. No stenosis about the origin of the great vessels. Right carotid system: Right common and internal carotid arteries are tortuous without dissection. Mild atheromatous change about the right carotid bulb without hemodynamically significant greater than 50% stenosis. Left carotid system: Left common and internal carotid arteries are tortuous without evidence for dissection. Mild atheromatous change without hemodynamically significant stenosis. Vertebral arteries: Both vertebral arteries arise from subclavian arteries. No proximal subclavian artery stenosis. Vertebral arteries are patent without stenosis or dissection. Skeleton: No worrisome osseous lesions. Prior ACDF at C4-C5. Advanced spondylosis at C5-6 and C6-7. Large geode formation noted at the dens. Degenerative changes  noted about the right TMJ. Other neck: No other acute soft tissue abnormality within the neck. 2 cm soft tissue mass present at the inferior left parotid gland (series 4, image 80), indeterminate. Similar 1.3 cm soft tissue lesion present within the contralateral right parotid gland (series 4, image 91). Upper chest: Hazy subsegmental atelectatic changes noted within the visualized lungs. Scattered small volume secretions noted within the mid esophageal lumen. Review of the MIP images confirms the above findings CTA HEAD FINDINGS Anterior circulation: Atheromatous irregularity within the carotid siphons without hemodynamically significant stenosis. 3 mm outpouching  extending inferiorly from the supraclinoid right ICA felt to be most consistent with a vascular infundibulum related to a hypoplastic right PCOM. A1 segments patent bilaterally. Normal anterior communicating complex. Anterior cerebral arteries are patent without significant stenosis. Right M1 segment widely patent. Short-segment mild-to-moderate stenosis involving the mid left M1 segment noted (series 9, image 21). No proximal MCA branch occlusion or high-grade stenosis. Distal MCA branches are perfused and symmetric. Posterior circulation: Both V4 segments are tortuous but widely patent without stenosis. Both PICA patent at their origins. Basilar patent without stenosis. Superior cerebellar arteries patent bilaterally. Both PCAs primarily supplied via the basilar. Right PCA widely patent to its distal aspect. Atheromatous irregularity and tortuosity about the proximal left ECA with associated moderate left P1/P2 stenoses (series 9, image 20). Left PCA otherwise patent to its distal aspect. Venous sinuses: Patent allowing for timing the contrast bolus. Anatomic variants: None significant.  No aneurysm. Review of the MIP images confirms the above findings IMPRESSION: 1. Negative CTA for large vessel occlusion or other emergent finding. 2. Intracranial atherosclerotic disease with associated short-segment mild-to-moderate mid left M1 segment stenosis, with moderate proximal left PCA stenoses. 3. Diffuse tortuosity of the major arterial vasculature of the head and neck, suggesting chronic underlying hypertension. 4. Bilateral parotid lesions measuring up to 2 cm on the left, indeterminate. Nonemergent outpatient ENT referral for further workup and evaluation could be performed for further evaluation as warranted. The Electronically Signed   By: Rise Mu M.D.   On: 02/14/2023 22:22   CT Head Wo Contrast  Result Date: 02/14/2023 CLINICAL DATA:  Memory loss, shortness of breath EXAM: CT HEAD WITHOUT  CONTRAST TECHNIQUE: Contiguous axial images were obtained from the base of the skull through the vertex without intravenous contrast. RADIATION DOSE REDUCTION: This exam was performed according to the departmental dose-optimization program which includes automated exposure control, adjustment of the mA and/or kV according to patient size and/or use of iterative reconstruction technique. COMPARISON:  None Available. FINDINGS: Brain: Periventricular white matter changes, likely the sequela of chronic small vessel ischemic disease. Additional hypodensity is noted throughout the bilateral basal ganglia and thalami, which is technically age indeterminate but may be the sequela of acute or subacute infarcts. No evidence of acute hemorrhage, mass, mass effect, or midline shift. No hydrocephalus or extra-axial fluid collection. Cerebral volume is overall within normal limits for age. Vascular: No hyperdense vessel. Skull: Negative for fracture or focal lesion. Sinuses/Orbits: Mucosal thickening in the ethmoid air cells and sphenoid sinuses, with left sphenoid sinus air-fluid level. Status post bilateral lens replacements. Other: Small amount of fluid in the right mastoid air cells. IMPRESSION: 1. Hypodensity throughout the bilateral basal ganglia and thalami, which is technically age indeterminate and favored to be chronic but could be the sequela of acute or subacute infarcts. Correlate with symptoms and consider MRI if there is clinical concern for acute or subacute infarct. 2. Mucosal thickening in the  ethmoid air cells and sphenoid sinuses, with left sphenoid sinus air-fluid level, which can be seen in the setting of acute sinusitis. Electronically Signed   By: Wiliam Ke M.D.   On: 02/14/2023 17:34   DG Chest 2 View  Result Date: 02/14/2023 CLINICAL DATA:  Hip surgery 3 weeks ago, decreased mental status, shortness of breath, cough EXAM: CHEST - 2 VIEW COMPARISON:  02/01/2023 FINDINGS: Frontal and lateral views  of the chest demonstrate an unremarkable cardiac silhouette. There is left lower lobe consolidation, which could reflect pneumonia or aspiration. No significant pleural effusion. Right chest is clear. No pneumothorax. No acute bony abnormalities. IMPRESSION: 1. Left lower lobe airspace disease consistent with infection or aspiration. Electronically Signed   By: Sharlet Salina M.D.   On: 02/14/2023 17:04   DG Abd 1 View  Result Date: 02/02/2023 CLINICAL DATA:  History of constipation EXAM: ABDOMEN - 1 VIEW COMPARISON:  None Available. FINDINGS: Scattered large and small bowel gas is noted. No obstructive changes are seen. Bilateral hip replacements are noted. Degenerative changes of the thoracolumbar spine are seen. No abnormal mass is noted. IMPRESSION: No obstructive changes are noted. Electronically Signed   By: Alcide Clever M.D.   On: 02/02/2023 18:55   DG CHEST PORT 1 VIEW  Result Date: 02/01/2023 CLINICAL DATA:  Aspiration into airway EXAM: PORTABLE CHEST 1 VIEW COMPARISON:  01/26/2023 FINDINGS: Transverse diameter of heart is increased. There is poor inspiration. Lung fields are clear of any infiltrates or pulmonary edema. Small linear densities are seen in left lower lung field. There is no pleural effusion or pneumothorax. Degenerative changes are noted in right shoulder. IMPRESSION: Cardiomegaly. Small linear densities in left lower lung field suggest subsegmental atelectasis. There are no signs of pulmonary edema or focal pulmonary consolidation. Electronically Signed   By: Ernie Avena M.D.   On: 02/01/2023 12:10   DG HIP UNILAT WITH PELVIS 2-3 VIEWS LEFT  Result Date: 01/27/2023 CLINICAL DATA:  Postop left hip arthroplasty EXAM: DG HIP (WITH OR WITHOUT PELVIS) 2-3V LEFT COMPARISON:  Yesterday FINDINGS: Interval left hip arthroplasty. Remote right hip arthroplasty. No new fracture. IMPRESSION: Expected appearance after left hip arthroplasty. Electronically Signed   By: Jeronimo Greaves M.D.    On: 01/27/2023 17:30   DG CHEST PORT 1 VIEW  Result Date: 01/26/2023 CLINICAL DATA:  [id number], preoperative chest x-ray for left femoral neck fracture fixation. EXAM: PORTABLE CHEST 1 VIEW COMPARISON:  Chest CT no contrast 11/04/2022 FINDINGS: The lungs are clear of infiltrates. The sulci are sharp. Stable mediastinum with mild aortic calcification and tortuosity. There is mild cardiomegaly without CHF. C5-6 ACDF plating is again shown. Moderate thoracic spondylosis. IMPRESSION: 1. No acute cardiopulmonary findings. 2. Mild cardiomegaly. 3. Aortic atherosclerosis. Electronically Signed   By: Almira Bar M.D.   On: 01/26/2023 22:12   DG Hip Unilat W or Wo Pelvis 2-3 Views Left  Result Date: 01/26/2023 CLINICAL DATA:  Fall with left hip pain EXAM: DG HIP (WITH OR WITHOUT PELVIS) 2-3V LEFT COMPARISON:  10/14/2015 FINDINGS: Right hip replacement. Pubic symphysis and rami appear intact. Acute left femoral neck fracture with superior displacement of the trochanter. No femoral head dislocation IMPRESSION: Acute displaced left femoral neck fracture. Electronically Signed   By: Jasmine Pang M.D.   On: 01/26/2023 17:43    Microbiology: Results for orders placed or performed during the hospital encounter of 02/14/23  Blood culture (routine x 2)     Status: None (Preliminary result)   Collection Time: 02/14/23  9:27 PM   Specimen: BLOOD  Result Value Ref Range Status   Specimen Description BLOOD RIGHT ANTECUBITAL  Final   Special Requests   Final    BOTTLES DRAWN AEROBIC AND ANAEROBIC Blood Culture adequate volume   Culture   Final    NO GROWTH 4 DAYS Performed at [facility], [address] [place], [place] [address]    Report Status PENDING  Incomplete  Blood culture (routine x 2)     Status: None (Preliminary result)   Collection Time: 02/14/23  9:28 PM   Specimen: BLOOD  Result Value Ref Range Status   Specimen Description BLOOD BLOOD RIGHT HAND  Final   Special Requests   Final    BOTTLES  DRAWN AEROBIC AND ANAEROBIC Blood Culture adequate volume   Culture   Final    NO GROWTH 4 DAYS Performed at [facility], [address], [place], [place] [address]    Report Status PENDING  Incomplete  MRSA Next Gen by PCR, Nasal     Status: None   Collection Time: 02/16/23 10:25 AM   Specimen: Nasal Mucosa; Nasal Swab  Result Value Ref Range Status   MRSA by PCR Next Gen NOT DETECTED NOT DETECTED Final    Comment: (NOTE) The GeneXpert MRSA Assay (FDA approved for NASAL specimens only), is one component of a comprehensive MRSA colonization surveillance program. It is not intended to diagnose MRSA infection nor to guide or monitor treatment for MRSA infections. Test performance is not FDA approved in patients less than 62 years old. Performed at [facility], [address], [place], [place] [address]     Labs: CBC: Recent Labs  Lab 02/14/23 1842 02/16/23 0358  WBC 12.0* 9.4  NEUTROABS 8.7*  --   HGB 12.1* 10.8*  HCT 39.0 36.4*  MCV 98.7 100.3*  PLT 263 217   Basic Metabolic Panel: Recent Labs  Lab 02/14/23 1842 02/16/23 0358 02/16/23 1704  NA 141 142  --   K 3.2* 3.3* 3.5  CL 100 108  --   CO2 31 26  --   GLUCOSE 107* 104*  --   BUN 32* 26*  --   CREATININE 1.52* 1.38*  --   CALCIUM 9.7 8.6*  --   MG  --  1.8  --   PHOS  --  3.0  --    Liver Function Tests: Recent Labs  Lab 02/14/23 1842 02/16/23 0358  AST 13* 10*  ALT 17 13  ALKPHOS 94 81  BILITOT 0.7 0.8  PROT 7.3 5.8*  ALBUMIN 2.7* 2.2*   CBG: Recent Labs  Lab 02/15/23 2217  GLUCAP 85    Discharge time spent: 35 minutes.  Signed: Jacquelin Hawking, MD [facility] 02/18/2023

## 2023-02-18 NOTE — Consult Note (Signed)
   [facility] Inpatient Consult   02/18/2023  Gerald Hall 04-27-1935 [id number]     Location: [facility] RN Hospital Liaison screened remotely([place]).   [facility] Customer service manager [facility]) Accountable Care Organization [ACO] Patient: [name]    Primary Care Provider:  Babs Sciara, MD [facility]   Patient screened for 30 day readmission hospitalization with noted High risk score for unplanned readmission risk with 2 IP in 6 months. [facility]/Population Health RN liaison will assess for potential [facility]) Care Management service needs for post hospital transition for care coordination. Will collaborate with PAC-RN that pt will discharge today to a facility [facility] does not follow.    [facility] Care Management/Population Health does not replace or interfere with any arrangements made by the Inpatient Transition of Care team.   For questions contact:      [name], RN, BSN [name]

## 2023-02-19 DIAGNOSIS — E039 Hypothyroidism, unspecified: Secondary | ICD-10-CM | POA: Diagnosis not present

## 2023-02-19 DIAGNOSIS — N189 Chronic kidney disease, unspecified: Secondary | ICD-10-CM | POA: Diagnosis not present

## 2023-02-19 DIAGNOSIS — I509 Heart failure, unspecified: Secondary | ICD-10-CM | POA: Diagnosis not present

## 2023-02-19 DIAGNOSIS — G8911 Acute pain due to trauma: Secondary | ICD-10-CM | POA: Diagnosis not present

## 2023-02-19 DIAGNOSIS — R0602 Shortness of breath: Secondary | ICD-10-CM | POA: Diagnosis not present

## 2023-02-19 DIAGNOSIS — I480 Paroxysmal atrial fibrillation: Secondary | ICD-10-CM | POA: Diagnosis not present

## 2023-02-19 DIAGNOSIS — J449 Chronic obstructive pulmonary disease, unspecified: Secondary | ICD-10-CM | POA: Diagnosis not present

## 2023-02-19 DIAGNOSIS — E1122 Type 2 diabetes mellitus with diabetic chronic kidney disease: Secondary | ICD-10-CM | POA: Diagnosis not present

## 2023-02-19 LAB — CULTURE, BLOOD (ROUTINE X 2)

## 2023-02-22 ENCOUNTER — Telehealth: Payer: Self-pay | Admitting: Physician Assistant

## 2023-02-22 ENCOUNTER — Ambulatory Visit: Payer: Medicare Other | Admitting: Family Medicine

## 2023-02-22 NOTE — Telephone Encounter (Signed)
My apologies, I had just seen the upcoming appt with Ryan on 02/25/23. Reviewed chart from recent OV with me 02/14/23. Sent to ED from that visit, diagnosed with pneumonia. Would still benefit from ongoing cardiology follow-up. If they feel the patient is doing well and would like to push this out another 2 weeks, that is OK by me. Would just ensure he has close primary care follow-up given his multiple medical issues.

## 2023-02-22 NOTE — Telephone Encounter (Signed)
Spoke to daughter who stated that patient is now residing in [address] which is [place] hours south of [place]. Patients daughter stated that she was unsure if they would be able to get patient to his appt with R.Dunn, PA-C by 8 am. Offered daughter a new appt in [place] office as pt's daughter stated this is closer for them. Daughter opted to keep original appointment for now as next available with provider would be 5/14 @ 8 am. Daughter will call back after speaking to facility caregivers and make arrangements for follow up .

## 2023-02-22 NOTE — Telephone Encounter (Signed)
Pt daughter wanting a nurse to call her back to see if pt really needs his f/u this Friday.. please advise.

## 2023-02-25 ENCOUNTER — Encounter: Payer: Self-pay | Admitting: Physician Assistant

## 2023-02-25 ENCOUNTER — Ambulatory Visit: Payer: Medicare Other | Attending: Physician Assistant | Admitting: Physician Assistant

## 2023-02-25 VITALS — BP 127/66 | HR 72 | Ht 68.0 in | Wt 195.0 lb

## 2023-02-25 DIAGNOSIS — I340 Nonrheumatic mitral (valve) insufficiency: Secondary | ICD-10-CM | POA: Diagnosis not present

## 2023-02-25 DIAGNOSIS — R400 Somnolence: Secondary | ICD-10-CM | POA: Diagnosis not present

## 2023-02-25 DIAGNOSIS — I2584 Coronary atherosclerosis due to calcified coronary lesion: Secondary | ICD-10-CM | POA: Diagnosis not present

## 2023-02-25 DIAGNOSIS — I471 Supraventricular tachycardia, unspecified: Secondary | ICD-10-CM

## 2023-02-25 DIAGNOSIS — I2510 Atherosclerotic heart disease of native coronary artery without angina pectoris: Secondary | ICD-10-CM

## 2023-02-25 DIAGNOSIS — I5022 Chronic systolic (congestive) heart failure: Secondary | ICD-10-CM

## 2023-02-25 DIAGNOSIS — I4891 Unspecified atrial fibrillation: Secondary | ICD-10-CM | POA: Diagnosis not present

## 2023-02-25 NOTE — Progress Notes (Signed)
Cardiology Office Note    Date:  02/25/2023   ID:  Antionio, Schmelzle 1935/09/28, MRN [id number]  PCP:  Babs Sciara, MD  Cardiologist:  Nona Dell, MD  Electrophysiologist:  None   Chief Complaint: Hospital follow-up  History of Present Illness:   Gerald Hall is a 88 y.o. male with history of coronary artery calcification noted on prior noncardiac CT imaging, HFrEF, lone A-fib in the setting of pneumonia in 2020, chronic hypoxic respiratory failure on supplemental oxygen, PSVT, COPD, CKD stage III borderline a-b, HTN, HLD, hyperthyroidism on methimazole, and temporal arteritis who presents for hospital follow-up.  Patient has a history of cardiomyopathy with a EF in 2020 of 45 to 50% further decreasing to 40 to 45% in 12/2021 and most recently 30 to 35% in 11/2022.  Prior ischemia evaluation has not been pursued given lack of anginal symptoms and in the setting of his age.  Prior CT of the chest in 05/2022 demonstrated mild coronary artery calcification along the left main and LCx.  He has a history of lone A-fib in 2020 in the setting of pneumonia and was hesitant to initiate anticoagulation in light of slowly healing leg wounds.  He has been maintained on amiodarone in the setting of PSVT with dose having temporarily been held due to prolonged QTc, subsequently resumed at lower dose 100 mg daily with stable QT.  Most recent echo from 11/2022 demonstrated an EF of 30 to 35%, grade 1 diastolic dysfunction, moderately enlarged RV, moderate biatrial enlargement, and mild mitral regurgitation.  He was admitted to the hospital from 4/3 through 02/04/2023 in the setting of mechanical fall and femoral neck fracture status post surgical repair.  During this admission, as outlined above, amiodarone was briefly held for prolonged QT and subsequently resumed at lower dose 100 mg daily with stable QT.  He was seen in our [place] office on 02/14/2023 for follow-up.  At that time, he was not doing  well.  He was noted he has been fairly independent at home preceding his fall with femoral neck fracture.  However, following that hospitalization he was requiring max assist for nearly all ADLs.  He was somnolent, falling asleep during the interview with noted right upper extremity jerking.  He continues to have exertional dyspnea with minimal activity and was without symptoms of frank angina.  He was subsequently transferred to the ED and was admitted to the hospital from 4/22 through 02/18/2023 for community-acquired pneumonia felt to be related to aspiration and treated by the hospitalist service with antibiotics.  Chest x-ray showed left lower lobe airspace disease consistent with infection or aspiration.  Due to somnolence, the patient underwent CT of the head which showed hypodensity throughout the bilateral basal ganglia and thalami that was technically age indeterminate and favored to be chronic, but could not rule out sequela of acute or subacute infarcts.  CTA of the head/neck was negative for large vessel occlusion or other emergent findings along with intracranial atherosclerotic disease noted and diffuse tortuosity of the major arterial vasculature of the head and neck suggestive of underlying hypertension, as well as other incidental nonacute findings.  MRI of the brain showed no acute intracranial abnormality with severe chronic small vessel disease.  The patient comes in accompanied by his son-in-law today.  This is my first time meeting the patient.  Patient reports he continues to have shortness of breath and is without symptoms of frank chest pain.  He reports a poor appetite with  his weight being down 10 pounds by our scale when compared to his visit with cardiology at outside office in 12/2022 (the patient was too weak to stand and weigh at his visit with cardiology in 01/2023).  The patient's son-in-law reports that there has been a significant decline in the patient's functional status since  breaking his femur.  He now requires max assist for nearly all ADLs.  They also report some concern for apneic episodes.  He had patient's son-in-law indicates the patient has expressed his wishes to the family that he would not want aggressive or invasive procedures.  They are beginning discussions regarding long-term goals of care.    Labs independently reviewed: 01/2023 -potassium 3.5, TSH normal, magnesium 1.8, Hgb 10.8, PLT 217, BUN 26, serum creatinine 1.38, albumin 2.2, AST/ALT not elevated, A1c 6.4, TC 124, TG 69, HDL 30, LDL 80  Past Medical History:  Diagnosis Date   Aortic atherosclerosis (HCC) 06/23/2022   Seen on CAT scan   Arthritis    Balance problem    Uses a walker   Blood in urine    Sees urology yearly   Chronic HFrEF (heart failure with reduced ejection fraction) (HCC)    Chronic kidney disease, stage 3 (HCC)    Coronary artery calcification seen on CT scan    Deafness in right ear age 69   Hyperlipidemia    Hypertension    Intractable nausea and vomiting 02/04/2023   Prolonged QT interval    PSVT (paroxysmal supraventricular tachycardia)    Temporal arteritis (HCC) 2016   Urinary incontinence     Past Surgical History:  Procedure Laterality Date   BACK SURGERY  2010   BELPHAROPTOSIS REPAIR     CATARACT EXTRACTION Bilateral 07/30/14, 08/13/2014   CERVICAL SPINE SURGERY  09/01/15   C 4-5 , [facility] , [place]   COLONOSCOPY  12/19/2007   RMR: 1. External hemorrohids, otherwise normal rectum.2. Normal colon. 3. Normal terminal ileum.   COLONOSCOPY N/A 12/13/2014   Procedure: COLONOSCOPY;  Surgeon: Corbin Ade, MD;  Location: [facility];  Service: Endoscopy;  Laterality: N/A;  11:15 Pt Request Time   HIP ARTHROPLASTY Left 01/27/2023   Procedure: ARTHROPLASTY BIPOLAR HIP (HEMIARTHROPLASTY);  Surgeon: Oliver Barre, MD;  Location: [facility] ORS;  Service: Orthopedics;  Laterality: Left;   KNEE SURGERY Left    around 2000, arthroscopic   TOTAL HIP ARTHROPLASTY  Right 02/23/2017   Procedure: TOTAL HIP ARTHROPLASTY ANTERIOR APPROACH;  Surgeon: Kennedy Bucker, MD;  Location: [facility] ORS;  Service: Orthopedics;  Laterality: Right;    Current Medications: Current Meds  Medication Sig   acetaminophen (TYLENOL) 500 MG tablet Take 1,000 mg by mouth every 6 (six) hours as needed for mild pain or headache.   albuterol (VENTOLIN HFA) 108 (90 Base) MCG/ACT inhaler Inhale 2 puffs into the lungs every 4 (four) hours as needed for wheezing.   amiodarone (PACERONE) 100 MG tablet Take 1 tablet (100 mg total) by mouth daily.   aspirin EC 81 MG tablet Take 1 tablet (81 mg total) by mouth 2 (two) times daily. Swallow whole.   budesonide-formoterol (SYMBICORT) 160-4.5 MCG/ACT inhaler Inhale 2 puffs into the lungs 2 (two) times daily.   calcium carbonate (OS-CAL) 600 MG TABS tablet Take 600 mg by mouth daily with breakfast.   carvedilol (COREG) 3.125 MG tablet Take 1 tablet (3.125 mg total) by mouth 2 (two) times daily with a meal.   cholecalciferol (VITAMIN D) 400 units TABS tablet Take 1,000 Units  by mouth daily.   clonazePAM (KLONOPIN) 0.5 MG tablet Take 1 tablet (0.5 mg total) by mouth at bedtime as needed (insomnia).   Dextromethorphan-guaiFENesin (MUCINEX DM) 30-600 MG TB12 Take 1 tablet by mouth daily.    feeding supplement, GLUCERNA SHAKE, (GLUCERNA SHAKE) LIQD Take 237 mLs by mouth 3 (three) times daily between meals.   finasteride (PROSCAR) 5 MG tablet TAKE ONE TABLET BY MOUTH EVERY DAY   fluticasone (FLONASE) 50 MCG/ACT nasal spray Place 2 sprays into both nostrils at bedtime.   ipratropium-albuterol (DUONEB) 0.5-2.5 (3) MG/3ML SOLN Take 3 mLs by nebulization every 4 (four) hours as needed. (Patient taking differently: Take 3 mLs by nebulization every 4 (four) hours as needed (shortness of breath).)   JARDIANCE 10 MG TABS tablet TAKE ONE TABLET BY MOUTH ONCE DAILY BEFORE BREAKFAST (Patient taking differently: Take 10 mg by mouth daily.)   methimazole (TAPAZOLE) 5 MG  tablet TAKE ONE TABLET BY MOUTH EVERY DAY   mirabegron ER (MYRBETRIQ) 50 MG TB24 tablet Take 50 mg by mouth daily.   OXYGEN Inhale 2 L into the lungs continuous.   oxymetazoline (AFRIN) 0.05 % nasal spray Place 1 spray into both nostrils at bedtime.   polyethylene glycol (MIRALAX / GLYCOLAX) 17 g packet Take 17 g by mouth daily as needed for mild constipation.   polyvinyl alcohol (LIQUIFILM TEARS) 1.4 % ophthalmic solution Place 1 drop into both eyes as needed for dry eyes.   pregabalin (LYRICA) 25 MG capsule Take 1 capsule (25 mg total) by mouth 2 (two) times daily.   rOPINIRole (REQUIP) 2 MG tablet Take 1 tablet (2 mg total) by mouth 2 (two) times daily. (Patient taking differently: Take 2 mg by mouth at bedtime. 1 tablet in the afternoon as needed  and 1 tablet at bedtime)   tamsulosin (FLOMAX) 0.4 MG CAPS capsule TAKE (1) CAPSULE BY MOUTH TWICE DAILY. (Patient taking differently: Take 0.4 mg by mouth 2 (two) times daily.)   torsemide (DEMADEX) 10 MG tablet Take 1 tablet (10 mg total) by mouth every other day.   traMADol (ULTRAM) 50 MG tablet Take 1 tablet (50 mg total) by mouth 3 (three) times daily as needed for moderate pain or severe pain.   vitamin C (ASCORBIC ACID) 500 MG tablet Take 500 mg by mouth daily.   Zinc Acetate, Oral, (ZINC ACETATE PO) Take 1 tablet by mouth daily.    Allergies:   Cephalexin and Penicillins   Social History   Socioeconomic History   Marital status: Widowed    Spouse name: Britta Mccreedy   Number of children: 2   Years of education: 16   Highest education level: Not on file  Occupational History    Comment: retired Teacher, early years/pre  Tobacco Use   Smoking status: Former    Packs/day: .5    Types: Cigarettes   Smokeless tobacco: Never   Tobacco comments:    08/18/15 1/2 - 3/4 pack cigarettes daily  Vaping Use   Vaping Use: Never used  Substance and Sexual Activity   Alcohol use: Yes    Alcohol/week: 0.0 standard drinks of alcohol    Comment: mixed drink 1-2  times per month, social   Drug use: No   Sexual activity: Not on file  Other Topics Concern   Not on file  Social History Narrative   Lives at home. Has home health aids to assist with housekeeping/appointments.   Caffeine use- coffee 8-10 cups daily   2 daughters, one lives locally, other lives in [place].  2 granddaughters.   1 great granddaughter-age 41.   Social Determinants of Health   Financial Resource Strain: Low Risk  (03/16/2022)   Overall Financial Resource Strain (CARDIA)    Difficulty of Paying Living Expenses: Not hard at all  Food Insecurity: No Food Insecurity (02/15/2023)   Hunger Vital Sign    Worried About Running Out of Food in the Last Year: Never true    Ran Out of Food in the Last Year: Never true  Transportation Needs: No Transportation Needs (02/15/2023)   PRAPARE - Administrator, Civil Service (Medical): No    Lack of Transportation (Non-Medical): No  Physical Activity: Sufficiently Active (03/16/2022)   Exercise Vital Sign    Days of Exercise per Week: 3 days    Minutes of Exercise per Session: 60 min  Stress: No Stress Concern Present (03/16/2022)   [employer] of Occupational Health - Occupational Stress Questionnaire    Feeling of Stress : Only a little  Social Connections: Moderately Integrated (03/16/2022)   Social Connection and Isolation Panel [NHANES]    Frequency of Communication with Friends and Family: More than three times a week    Frequency of Social Gatherings with Friends and Family: More than three times a week    Attends Religious Services: More than 4 times per year    Active Member of [employer] or Organizations: Yes    Attends Banker Meetings: More than 4 times per year    Marital Status: Widowed     Family History:  The patient's family history includes Colon cancer in his brother; Diabetes in his brother and brother.  ROS:   12-point review of systems is negative unless otherwise noted in the  HPI.   EKGs/Labs/Other Studies Reviewed:    Studies reviewed were summarized above. The additional studies were reviewed today:  2D echo 12/15/2022: 1. Left ventricular ejection fraction, by estimation, is 30 to 35%. The  left ventricle has moderately decreased function. The left ventricle has  no regional wall motion abnormalities. There is mild left ventricular  hypertrophy. Left ventricular  diastolic parameters are consistent with Grade I diastolic dysfunction  (impaired relaxation).   2. Right ventricular systolic function is normal. The right ventricular  size is mildly enlarged. Tricuspid regurgitation signal is inadequate for  assessing PA pressure.   3. Left atrial size was moderately dilated.   4. Right atrial size was moderately dilated.   5. The mitral valve is normal in structure. Mild mitral valve  regurgitation. No evidence of mitral stenosis.   6. The aortic valve is tricuspid. Aortic valve regurgitation is not  visualized. No aortic stenosis is present.   7. The inferior vena cava is normal in size with greater than 50%  respiratory variability, suggesting right atrial pressure of 3 mmHg.   Comparison(s): Prior images reviewed side by side. Changes from prior  study are noted. LVEF worsened from 40-45% in 12/2021 to 30-35% now.  __________  2D echo 01/05/2022: 1. Left ventricular ejection fraction, by estimation, is 40 to 45%. The  left ventricle has mildly decreased function. The left ventricle  demonstrates global hypokinesis. The left ventricular internal cavity size  was mildly dilated. There is mild  concentric left ventricular hypertrophy. Left ventricular diastolic  parameters are consistent with Grade I diastolic dysfunction (impaired  relaxation).   2. Right ventricular systolic function is normal. The right ventricular  size is normal. Tricuspid regurgitation signal is inadequate for assessing  PA pressure.  3. Left atrial size was moderately  dilated.   4. The mitral valve is abnormal. Mild to moderate mitral valve  regurgitation. Moderate mitral annular calcification.   5. The aortic valve is tricuspid. Aortic valve regurgitation is trivial.   6. Aortic dilatation noted. There is borderline dilatation of the aortic  root, measuring 38 mm.   7. The inferior vena cava is normal in size with greater than 50%  respiratory variability, suggesting right atrial pressure of 3 mmHg.   Comparison(s): Compared to prior TTE in 02/2019, the LVEF has dropped to  40-45% (previously 45-50%) and there is now mild-to-moderate MR  (previously trivial).  __________  Luci [place] patch 03/2019: Predominant rhythm is sinus with heart rate ranging from 50 bpm up to 107 bpm, average heart rate 68 bpm. There were 7 episodes of NSVT noted, 4-5 beats at most. Brief episode of ventricular bigeminy noted. No sustained ventricular arrhythmias. Several episodes of SVT were also noted, largely regular narrow complex tachycardia which could represent either atrial flutter or possibly a reentrant mechanism or atrial tachycardia. There were no pauses.  __________  Limited echo 02/24/2019: 1. The left ventricle has mildly reduced systolic function, with an  ejection fraction of 45-50%. The cavity size was normal.   2. The right ventricle has normal systolc function. The cavity was mildly  enlarged. There is no increase in right ventricular wall thickness.   3. The mitral valve is abnormal. Mild thickening of the mitral valve  leaflet.   4. The tricuspid valve was grossly normal.   5. The aortic valve is tricuspid Mild thickening of the aortic valve Mild  calcification of the aortic valve.   6. The aortic root is normal in size and structure.   7. The interatrial septum was not assessed.    EKG:  EKG is ordered today.  The EKG ordered today demonstrates NSR, 72 bpm, first-degree AV block, occasional PVCs, nonspecific IVCD, nonspecific ST-T changes, QTc 450  Recent  Labs: 01/26/2023: B Natriuretic Peptide 253.0 02/16/2023: ALT 13; BUN 26; Creatinine, Ser 1.38; Hemoglobin 10.8; Magnesium 1.8; Platelets 217; Potassium 3.5; Sodium 142; TSH 0.658  Recent Lipid Panel    Component Value Date/Time   CHOL 124 02/15/2023 0750   CHOL 189 01/12/2023 1502   TRIG 69 02/15/2023 0750   HDL 30 (L) 02/15/2023 0750   HDL 46 01/12/2023 1502   CHOLHDL 4.1 02/15/2023 0750   VLDL 14 02/15/2023 0750   LDLCALC 80 02/15/2023 0750   LDLCALC 111 (H) 01/12/2023 1502    PHYSICAL EXAM:    VS:  BP 127/66 (BP Location: Right Arm, Patient Position: Sitting, Cuff Size: Normal)   Pulse 72   Ht 5\' 8"  (1.727 m)   Wt 195 lb (88.5 kg)   SpO2 91%   BMI 29.65 kg/m   BMI: Body mass index is 29.65 kg/m.  Physical Exam Vitals reviewed.  Constitutional:      Appearance: He is well-developed.     Comments: Nontoxic-appearing.  HENT:     Head: Normocephalic and atraumatic.  Eyes:     General:        Right eye: No discharge.        Left eye: No discharge.  Neck:     Vascular: No JVD.  Cardiovascular:     Rate and Rhythm: Normal rate and regular rhythm.     Heart sounds: Normal heart sounds, S1 normal and S2 normal. Heart sounds not distant. No midsystolic click and no opening snap. No murmur  heard.    No friction rub.  Pulmonary:     Effort: Pulmonary effort is normal. No respiratory distress.     Breath sounds: Decreased breath sounds present. No wheezing or rales.     Comments: On supplemental oxygen via nasal cannula. Chest:     Chest wall: No tenderness.  Abdominal:     General: There is no distension.     Palpations: Abdomen is soft.     Tenderness: There is no abdominal tenderness.  Musculoskeletal:     Cervical back: Normal range of motion.  Skin:    General: Skin is warm and dry.     Nails: There is no clubbing.  Neurological:     Mental Status: He is alert and oriented to person, place, and time.     Comments: Intermittent jerking of the right upper  extremity.  Psychiatric:        Speech: Speech normal.        Behavior: Behavior normal.        Thought Content: Thought content normal.        Judgment: Judgment normal.     Wt Readings from Last 3 Encounters:  02/25/23 195 lb (88.5 kg)  02/15/23 209 lb 15.8 oz (95.2 kg)  01/27/23 202 lb 6.1 oz (91.8 kg)     ASSESSMENT & PLAN:   HFrEF: He appears euvolemic.  Weight is down 10 pounds by our scale when compared to his cardiology visit in 12/2022.  This appears to be in the setting of diminished caloric intake.  Relative hypotension has historically precluded escalation of GDMT.  May need to consider transitioning torsemide from every other day to as needed if oral intake remains low.  For now, he remains on low-dose carvedilol, Jardiance, and torsemide.  Mitral regurgitation: Mild by echo in 11/2022.  Lone A-fib: Occurred in the setting of pneumonia.  Has historically not been on anticoagulation.  No evidence of recurrence at this time.  PSVT: Quiescent.  He remains on low-dose carvedilol and amiodarone 100 mg.  QTc stable.  Coronary artery calcification: No evidence of angina or cardiac decompensation.  Remains on aspirin 81 mg daily.  Chronic hypoxic respiratory failure: Follow-up with PCP/pulmonology.  No acute exacerbation.  Somnolence: Son-in-law reports the patient has had a significant decline in his functional status following mechanical fall with fractured femoral neck status postsurgical repair.  Son-in-law and myself had a good and detailed conversation today regarding the patient's functional status and potential new baseline.  We will refer the patient to palliative care to have goals of care discussion given significant decline in functional status and with ongoing significant comorbid conditions.  Son-in-law has expressed the patient would not want aggressive or invasive therapies at this time, which I feel is appropriate given his comorbid conditions and advanced age.   Should the family elect to pursue further testing, would consider referral to pulmonology to evaluate for hypercarbia contributing to the patient's somnolence and for further evaluation of witnessed apneic episodes.  If comfort measures are eventually pursued, would look to de-escalate medical therapy accordingly.  Patient's son-in-law is understanding of outlined plan and agrees to treatment course.  Given the above, I elected to defer follow-up labs at this time while goals of care are assessed.   Disposition: F/u with Dr. Diona Browner or an APP in 3 months.   Medication Adjustments/Labs and Tests Ordered: Current medicines are reviewed at length with the patient today.  Concerns regarding medicines are outlined above. Medication changes, Labs  and Tests ordered today are summarized above and listed in the Patient Instructions accessible in Encounters.   Signed, Eula Listen, PA-C 02/25/2023 1:27 PM     [facility] [place] [address] [place], [place] [address] [phone]

## 2023-02-25 NOTE — Patient Instructions (Addendum)
Medication Instructions:  No changes at this time.   *If you need a refill on your cardiac medications before your next appointment, please call your pharmacy*   Lab Work: None  If you have labs (blood work) drawn today and your tests are completely normal, you will receive your results only by: MyChart Message (if you have MyChart) OR A paper copy in the mail If you have any lab test that is abnormal or we need to change your treatment, we will call you to review the results.   Testing/Procedures: None   Follow-Up: At [facility], you and your health needs are our priority.  As part of our continuing mission to provide you with exceptional heart care, we have created designated Provider Care Teams.  These Care Teams include your primary Cardiologist (physician) and Advanced Practice Providers (APPs -  Physician Assistants and Nurse Practitioners) who all work together to provide you with the care you need, when you need it.   Your next appointment:   3 month(s)  Provider:   Eula Listen, PA-C    Other Instructions Referral placed to see Palliative care in [facility]. Provided list of numbers for your review.

## 2023-02-26 DIAGNOSIS — I480 Paroxysmal atrial fibrillation: Secondary | ICD-10-CM | POA: Diagnosis not present

## 2023-02-26 DIAGNOSIS — E1122 Type 2 diabetes mellitus with diabetic chronic kidney disease: Secondary | ICD-10-CM | POA: Diagnosis not present

## 2023-02-26 DIAGNOSIS — G8911 Acute pain due to trauma: Secondary | ICD-10-CM | POA: Diagnosis not present

## 2023-02-26 DIAGNOSIS — E039 Hypothyroidism, unspecified: Secondary | ICD-10-CM | POA: Diagnosis not present

## 2023-02-26 DIAGNOSIS — J449 Chronic obstructive pulmonary disease, unspecified: Secondary | ICD-10-CM | POA: Diagnosis not present

## 2023-02-26 DIAGNOSIS — R0602 Shortness of breath: Secondary | ICD-10-CM | POA: Diagnosis not present

## 2023-02-26 DIAGNOSIS — N189 Chronic kidney disease, unspecified: Secondary | ICD-10-CM | POA: Diagnosis not present

## 2023-02-26 DIAGNOSIS — I509 Heart failure, unspecified: Secondary | ICD-10-CM | POA: Diagnosis not present

## 2023-02-28 DIAGNOSIS — R2689 Other abnormalities of gait and mobility: Secondary | ICD-10-CM | POA: Diagnosis not present

## 2023-02-28 DIAGNOSIS — M25461 Effusion, right knee: Secondary | ICD-10-CM | POA: Diagnosis not present

## 2023-02-28 DIAGNOSIS — E039 Hypothyroidism, unspecified: Secondary | ICD-10-CM | POA: Diagnosis not present

## 2023-02-28 DIAGNOSIS — M25562 Pain in left knee: Secondary | ICD-10-CM | POA: Diagnosis not present

## 2023-02-28 DIAGNOSIS — M159 Polyosteoarthritis, unspecified: Secondary | ICD-10-CM | POA: Diagnosis not present

## 2023-02-28 DIAGNOSIS — R519 Headache, unspecified: Secondary | ICD-10-CM | POA: Diagnosis not present

## 2023-02-28 DIAGNOSIS — F17210 Nicotine dependence, cigarettes, uncomplicated: Secondary | ICD-10-CM | POA: Diagnosis not present

## 2023-02-28 DIAGNOSIS — I129 Hypertensive chronic kidney disease with stage 1 through stage 4 chronic kidney disease, or unspecified chronic kidney disease: Secondary | ICD-10-CM | POA: Diagnosis not present

## 2023-02-28 DIAGNOSIS — G2581 Restless legs syndrome: Secondary | ICD-10-CM | POA: Diagnosis not present

## 2023-02-28 DIAGNOSIS — H9012 Conductive hearing loss, unilateral, left ear, with unrestricted hearing on the contralateral side: Secondary | ICD-10-CM | POA: Diagnosis not present

## 2023-02-28 DIAGNOSIS — H3530 Unspecified macular degeneration: Secondary | ICD-10-CM | POA: Diagnosis not present

## 2023-02-28 DIAGNOSIS — R0689 Other abnormalities of breathing: Secondary | ICD-10-CM | POA: Diagnosis not present

## 2023-02-28 DIAGNOSIS — M25561 Pain in right knee: Secondary | ICD-10-CM | POA: Diagnosis not present

## 2023-02-28 DIAGNOSIS — J9622 Acute and chronic respiratory failure with hypercapnia: Secondary | ICD-10-CM | POA: Diagnosis not present

## 2023-02-28 DIAGNOSIS — W06XXXA Fall from bed, initial encounter: Secondary | ICD-10-CM | POA: Diagnosis not present

## 2023-02-28 DIAGNOSIS — R918 Other nonspecific abnormal finding of lung field: Secondary | ICD-10-CM | POA: Diagnosis not present

## 2023-02-28 DIAGNOSIS — M150 Primary generalized (osteo)arthritis: Secondary | ICD-10-CM | POA: Diagnosis not present

## 2023-02-28 DIAGNOSIS — Z7951 Long term (current) use of inhaled steroids: Secondary | ICD-10-CM | POA: Diagnosis not present

## 2023-02-28 DIAGNOSIS — Z20822 Contact with and (suspected) exposure to covid-19: Secondary | ICD-10-CM | POA: Diagnosis not present

## 2023-02-28 DIAGNOSIS — M1712 Unilateral primary osteoarthritis, left knee: Secondary | ICD-10-CM | POA: Diagnosis not present

## 2023-02-28 DIAGNOSIS — E876 Hypokalemia: Secondary | ICD-10-CM | POA: Diagnosis not present

## 2023-02-28 DIAGNOSIS — R29898 Other symptoms and signs involving the musculoskeletal system: Secondary | ICD-10-CM | POA: Diagnosis not present

## 2023-02-28 DIAGNOSIS — I429 Cardiomyopathy, unspecified: Secondary | ICD-10-CM | POA: Diagnosis not present

## 2023-02-28 DIAGNOSIS — E870 Hyperosmolality and hypernatremia: Secondary | ICD-10-CM | POA: Diagnosis not present

## 2023-02-28 DIAGNOSIS — E0590 Thyrotoxicosis, unspecified without thyrotoxic crisis or storm: Secondary | ICD-10-CM | POA: Diagnosis not present

## 2023-02-28 DIAGNOSIS — R269 Unspecified abnormalities of gait and mobility: Secondary | ICD-10-CM | POA: Diagnosis not present

## 2023-02-28 DIAGNOSIS — S0990XA Unspecified injury of head, initial encounter: Secondary | ICD-10-CM | POA: Diagnosis not present

## 2023-02-28 DIAGNOSIS — Z792 Long term (current) use of antibiotics: Secondary | ICD-10-CM | POA: Diagnosis not present

## 2023-02-28 DIAGNOSIS — R7989 Other specified abnormal findings of blood chemistry: Secondary | ICD-10-CM | POA: Diagnosis not present

## 2023-02-28 DIAGNOSIS — R296 Repeated falls: Secondary | ICD-10-CM | POA: Diagnosis not present

## 2023-02-28 DIAGNOSIS — Z9181 History of falling: Secondary | ICD-10-CM | POA: Diagnosis not present

## 2023-02-28 DIAGNOSIS — H90A21 Sensorineural hearing loss, unilateral, right ear, with restricted hearing on the contralateral side: Secondary | ICD-10-CM | POA: Diagnosis not present

## 2023-02-28 DIAGNOSIS — Z7901 Long term (current) use of anticoagulants: Secondary | ICD-10-CM | POA: Diagnosis not present

## 2023-02-28 DIAGNOSIS — H353230 Exudative age-related macular degeneration, bilateral, stage unspecified: Secondary | ICD-10-CM | POA: Diagnosis not present

## 2023-02-28 DIAGNOSIS — R338 Other retention of urine: Secondary | ICD-10-CM | POA: Diagnosis not present

## 2023-02-28 DIAGNOSIS — W19XXXA Unspecified fall, initial encounter: Secondary | ICD-10-CM | POA: Diagnosis not present

## 2023-02-28 DIAGNOSIS — R0989 Other specified symptoms and signs involving the circulatory and respiratory systems: Secondary | ICD-10-CM | POA: Diagnosis not present

## 2023-02-28 DIAGNOSIS — I6529 Occlusion and stenosis of unspecified carotid artery: Secondary | ICD-10-CM | POA: Diagnosis not present

## 2023-02-28 DIAGNOSIS — N1831 Chronic kidney disease, stage 3a: Secondary | ICD-10-CM | POA: Diagnosis not present

## 2023-02-28 DIAGNOSIS — F17200 Nicotine dependence, unspecified, uncomplicated: Secondary | ICD-10-CM | POA: Diagnosis not present

## 2023-02-28 DIAGNOSIS — J9621 Acute and chronic respiratory failure with hypoxia: Secondary | ICD-10-CM | POA: Diagnosis not present

## 2023-02-28 DIAGNOSIS — E871 Hypo-osmolality and hyponatremia: Secondary | ICD-10-CM | POA: Diagnosis not present

## 2023-02-28 DIAGNOSIS — Z743 Need for continuous supervision: Secondary | ICD-10-CM | POA: Diagnosis not present

## 2023-02-28 DIAGNOSIS — K224 Dyskinesia of esophagus: Secondary | ICD-10-CM | POA: Diagnosis not present

## 2023-02-28 DIAGNOSIS — J9611 Chronic respiratory failure with hypoxia: Secondary | ICD-10-CM | POA: Diagnosis not present

## 2023-02-28 DIAGNOSIS — R0602 Shortness of breath: Secondary | ICD-10-CM | POA: Diagnosis not present

## 2023-02-28 DIAGNOSIS — M4802 Spinal stenosis, cervical region: Secondary | ICD-10-CM | POA: Diagnosis not present

## 2023-02-28 DIAGNOSIS — Z79899 Other long term (current) drug therapy: Secondary | ICD-10-CM | POA: Diagnosis not present

## 2023-02-28 DIAGNOSIS — M25462 Effusion, left knee: Secondary | ICD-10-CM | POA: Diagnosis not present

## 2023-02-28 DIAGNOSIS — R531 Weakness: Secondary | ICD-10-CM | POA: Diagnosis not present

## 2023-02-28 DIAGNOSIS — I10 Essential (primary) hypertension: Secondary | ICD-10-CM | POA: Diagnosis not present

## 2023-02-28 DIAGNOSIS — E860 Dehydration: Secondary | ICD-10-CM | POA: Diagnosis not present

## 2023-02-28 DIAGNOSIS — I482 Chronic atrial fibrillation, unspecified: Secondary | ICD-10-CM | POA: Diagnosis not present

## 2023-02-28 DIAGNOSIS — I639 Cerebral infarction, unspecified: Secondary | ICD-10-CM | POA: Diagnosis not present

## 2023-02-28 DIAGNOSIS — Z7982 Long term (current) use of aspirin: Secondary | ICD-10-CM | POA: Diagnosis not present

## 2023-02-28 DIAGNOSIS — R0902 Hypoxemia: Secondary | ICD-10-CM | POA: Diagnosis not present

## 2023-02-28 DIAGNOSIS — J432 Centrilobular emphysema: Secondary | ICD-10-CM | POA: Diagnosis not present

## 2023-02-28 DIAGNOSIS — I4891 Unspecified atrial fibrillation: Secondary | ICD-10-CM | POA: Diagnosis not present

## 2023-02-28 DIAGNOSIS — R1310 Dysphagia, unspecified: Secondary | ICD-10-CM | POA: Diagnosis not present

## 2023-02-28 DIAGNOSIS — Z9981 Dependence on supplemental oxygen: Secondary | ICD-10-CM | POA: Diagnosis not present

## 2023-02-28 DIAGNOSIS — Z66 Do not resuscitate: Secondary | ICD-10-CM | POA: Diagnosis not present

## 2023-02-28 DIAGNOSIS — I5033 Acute on chronic diastolic (congestive) heart failure: Secondary | ICD-10-CM | POA: Diagnosis not present

## 2023-02-28 DIAGNOSIS — M8580 Other specified disorders of bone density and structure, unspecified site: Secondary | ICD-10-CM | POA: Diagnosis not present

## 2023-03-04 ENCOUNTER — Ambulatory Visit: Payer: Medicare Other | Admitting: Medical

## 2023-03-04 DIAGNOSIS — M8580 Other specified disorders of bone density and structure, unspecified site: Secondary | ICD-10-CM | POA: Diagnosis not present

## 2023-03-04 DIAGNOSIS — Z743 Need for continuous supervision: Secondary | ICD-10-CM | POA: Diagnosis not present

## 2023-03-04 DIAGNOSIS — I4891 Unspecified atrial fibrillation: Secondary | ICD-10-CM | POA: Diagnosis not present

## 2023-03-04 DIAGNOSIS — Z66 Do not resuscitate: Secondary | ICD-10-CM | POA: Diagnosis not present

## 2023-03-04 DIAGNOSIS — J9621 Acute and chronic respiratory failure with hypoxia: Secondary | ICD-10-CM | POA: Diagnosis not present

## 2023-03-04 DIAGNOSIS — J449 Chronic obstructive pulmonary disease, unspecified: Secondary | ICD-10-CM | POA: Diagnosis not present

## 2023-03-04 DIAGNOSIS — H9012 Conductive hearing loss, unilateral, left ear, with unrestricted hearing on the contralateral side: Secondary | ICD-10-CM | POA: Diagnosis not present

## 2023-03-04 DIAGNOSIS — E039 Hypothyroidism, unspecified: Secondary | ICD-10-CM | POA: Diagnosis not present

## 2023-03-04 DIAGNOSIS — N1831 Chronic kidney disease, stage 3a: Secondary | ICD-10-CM | POA: Diagnosis not present

## 2023-03-04 DIAGNOSIS — I129 Hypertensive chronic kidney disease with stage 1 through stage 4 chronic kidney disease, or unspecified chronic kidney disease: Secondary | ICD-10-CM | POA: Diagnosis not present

## 2023-03-04 DIAGNOSIS — I429 Cardiomyopathy, unspecified: Secondary | ICD-10-CM | POA: Diagnosis not present

## 2023-03-04 DIAGNOSIS — M150 Primary generalized (osteo)arthritis: Secondary | ICD-10-CM | POA: Diagnosis not present

## 2023-03-04 DIAGNOSIS — E43 Unspecified severe protein-calorie malnutrition: Secondary | ICD-10-CM | POA: Diagnosis not present

## 2023-03-04 DIAGNOSIS — I10 Essential (primary) hypertension: Secondary | ICD-10-CM | POA: Diagnosis not present

## 2023-03-04 DIAGNOSIS — F17200 Nicotine dependence, unspecified, uncomplicated: Secondary | ICD-10-CM | POA: Diagnosis not present

## 2023-03-04 DIAGNOSIS — M4802 Spinal stenosis, cervical region: Secondary | ICD-10-CM | POA: Diagnosis not present

## 2023-03-04 DIAGNOSIS — Z515 Encounter for palliative care: Secondary | ICD-10-CM | POA: Diagnosis not present

## 2023-03-04 DIAGNOSIS — H3530 Unspecified macular degeneration: Secondary | ICD-10-CM | POA: Diagnosis not present

## 2023-03-04 DIAGNOSIS — M159 Polyosteoarthritis, unspecified: Secondary | ICD-10-CM | POA: Diagnosis not present

## 2023-03-04 DIAGNOSIS — S72002D Fracture of unspecified part of neck of left femur, subsequent encounter for closed fracture with routine healing: Secondary | ICD-10-CM | POA: Diagnosis not present

## 2023-03-04 DIAGNOSIS — N183 Chronic kidney disease, stage 3 unspecified: Secondary | ICD-10-CM | POA: Diagnosis not present

## 2023-03-04 DIAGNOSIS — I5020 Unspecified systolic (congestive) heart failure: Secondary | ICD-10-CM | POA: Diagnosis not present

## 2023-03-04 DIAGNOSIS — H90A21 Sensorineural hearing loss, unilateral, right ear, with restricted hearing on the contralateral side: Secondary | ICD-10-CM | POA: Diagnosis not present

## 2023-03-04 DIAGNOSIS — I2510 Atherosclerotic heart disease of native coronary artery without angina pectoris: Secondary | ICD-10-CM | POA: Diagnosis not present

## 2023-03-04 DIAGNOSIS — Z741 Need for assistance with personal care: Secondary | ICD-10-CM | POA: Diagnosis not present

## 2023-03-04 DIAGNOSIS — E0590 Thyrotoxicosis, unspecified without thyrotoxic crisis or storm: Secondary | ICD-10-CM | POA: Diagnosis not present

## 2023-03-04 DIAGNOSIS — Z9181 History of falling: Secondary | ICD-10-CM | POA: Diagnosis not present

## 2023-03-04 DIAGNOSIS — R29898 Other symptoms and signs involving the musculoskeletal system: Secondary | ICD-10-CM | POA: Diagnosis not present

## 2023-03-04 DIAGNOSIS — R0609 Other forms of dyspnea: Secondary | ICD-10-CM | POA: Diagnosis not present

## 2023-03-04 DIAGNOSIS — E876 Hypokalemia: Secondary | ICD-10-CM | POA: Diagnosis not present

## 2023-03-04 DIAGNOSIS — G2581 Restless legs syndrome: Secondary | ICD-10-CM | POA: Diagnosis not present

## 2023-03-04 DIAGNOSIS — R338 Other retention of urine: Secondary | ICD-10-CM | POA: Diagnosis not present

## 2023-03-04 DIAGNOSIS — J432 Centrilobular emphysema: Secondary | ICD-10-CM | POA: Diagnosis not present

## 2023-03-07 DIAGNOSIS — J9621 Acute and chronic respiratory failure with hypoxia: Secondary | ICD-10-CM | POA: Diagnosis not present

## 2023-03-09 DIAGNOSIS — J9621 Acute and chronic respiratory failure with hypoxia: Secondary | ICD-10-CM | POA: Diagnosis not present

## 2023-03-11 ENCOUNTER — Encounter: Payer: Medicare Other | Admitting: Orthopedic Surgery

## 2023-03-14 ENCOUNTER — Telehealth: Payer: Self-pay | Admitting: Orthopedic Surgery

## 2023-03-14 DIAGNOSIS — J9621 Acute and chronic respiratory failure with hypoxia: Secondary | ICD-10-CM | POA: Diagnosis not present

## 2023-03-14 NOTE — Telephone Encounter (Signed)
Dr. Dallas Schimke pt - the daughter Harriett Sine [phone] lvm stating that the patient has an appointment on 03/18/23.  She stated that he is still in rehab 2 hours away.  She wants to know if Dr. Dallas Schimke "needs" to see him on Friday.  She stated he is expected to be in rehab through next week.  The patient is wanting to know if he can cross his legs, right over left.

## 2023-03-15 DIAGNOSIS — N183 Chronic kidney disease, stage 3 unspecified: Secondary | ICD-10-CM | POA: Diagnosis not present

## 2023-03-15 DIAGNOSIS — S72002D Fracture of unspecified part of neck of left femur, subsequent encounter for closed fracture with routine healing: Secondary | ICD-10-CM | POA: Diagnosis not present

## 2023-03-15 DIAGNOSIS — Z515 Encounter for palliative care: Secondary | ICD-10-CM | POA: Diagnosis not present

## 2023-03-15 DIAGNOSIS — I5020 Unspecified systolic (congestive) heart failure: Secondary | ICD-10-CM | POA: Diagnosis not present

## 2023-03-15 DIAGNOSIS — J449 Chronic obstructive pulmonary disease, unspecified: Secondary | ICD-10-CM | POA: Diagnosis not present

## 2023-03-15 NOTE — Telephone Encounter (Signed)
Called, spoke w/Nancy and advised per Dr. Dallas Schimke and she verbalized understanding.  She will call to schedule once the patient is back in [place].

## 2023-03-17 DIAGNOSIS — J9621 Acute and chronic respiratory failure with hypoxia: Secondary | ICD-10-CM | POA: Diagnosis not present

## 2023-03-18 ENCOUNTER — Encounter: Payer: Medicare Other | Admitting: Orthopedic Surgery

## 2023-03-18 ENCOUNTER — Ambulatory Visit: Payer: Medicare Other | Admitting: Family Medicine

## 2023-03-18 DIAGNOSIS — J9621 Acute and chronic respiratory failure with hypoxia: Secondary | ICD-10-CM | POA: Diagnosis not present

## 2023-03-22 DIAGNOSIS — J9621 Acute and chronic respiratory failure with hypoxia: Secondary | ICD-10-CM | POA: Diagnosis not present

## 2023-03-24 DIAGNOSIS — J9621 Acute and chronic respiratory failure with hypoxia: Secondary | ICD-10-CM | POA: Diagnosis not present

## 2023-03-25 DIAGNOSIS — J432 Centrilobular emphysema: Secondary | ICD-10-CM | POA: Diagnosis not present

## 2023-03-25 DIAGNOSIS — R0609 Other forms of dyspnea: Secondary | ICD-10-CM | POA: Diagnosis not present

## 2023-03-25 DIAGNOSIS — J9621 Acute and chronic respiratory failure with hypoxia: Secondary | ICD-10-CM | POA: Diagnosis not present

## 2023-03-28 DIAGNOSIS — J9621 Acute and chronic respiratory failure with hypoxia: Secondary | ICD-10-CM | POA: Diagnosis not present

## 2023-03-31 DIAGNOSIS — J9621 Acute and chronic respiratory failure with hypoxia: Secondary | ICD-10-CM | POA: Diagnosis not present

## 2023-03-31 DIAGNOSIS — R262 Difficulty in walking, not elsewhere classified: Secondary | ICD-10-CM | POA: Diagnosis not present

## 2023-03-31 DIAGNOSIS — Z741 Need for assistance with personal care: Secondary | ICD-10-CM | POA: Diagnosis not present

## 2023-03-31 DIAGNOSIS — Z9181 History of falling: Secondary | ICD-10-CM | POA: Diagnosis not present

## 2023-03-31 DIAGNOSIS — R531 Weakness: Secondary | ICD-10-CM | POA: Diagnosis not present

## 2023-04-01 DIAGNOSIS — E876 Hypokalemia: Secondary | ICD-10-CM | POA: Diagnosis not present

## 2023-04-01 DIAGNOSIS — Z741 Need for assistance with personal care: Secondary | ICD-10-CM | POA: Diagnosis not present

## 2023-04-01 DIAGNOSIS — Z9181 History of falling: Secondary | ICD-10-CM | POA: Diagnosis not present

## 2023-04-01 DIAGNOSIS — R262 Difficulty in walking, not elsewhere classified: Secondary | ICD-10-CM | POA: Diagnosis not present

## 2023-04-01 DIAGNOSIS — R531 Weakness: Secondary | ICD-10-CM | POA: Diagnosis not present

## 2023-04-04 DIAGNOSIS — Z9181 History of falling: Secondary | ICD-10-CM | POA: Diagnosis not present

## 2023-04-04 DIAGNOSIS — Z741 Need for assistance with personal care: Secondary | ICD-10-CM | POA: Diagnosis not present

## 2023-04-04 DIAGNOSIS — R262 Difficulty in walking, not elsewhere classified: Secondary | ICD-10-CM | POA: Diagnosis not present

## 2023-04-04 DIAGNOSIS — R531 Weakness: Secondary | ICD-10-CM | POA: Diagnosis not present

## 2023-04-05 DIAGNOSIS — Z9181 History of falling: Secondary | ICD-10-CM | POA: Diagnosis not present

## 2023-04-05 DIAGNOSIS — J9621 Acute and chronic respiratory failure with hypoxia: Secondary | ICD-10-CM | POA: Diagnosis not present

## 2023-04-05 DIAGNOSIS — Z741 Need for assistance with personal care: Secondary | ICD-10-CM | POA: Diagnosis not present

## 2023-04-05 DIAGNOSIS — R262 Difficulty in walking, not elsewhere classified: Secondary | ICD-10-CM | POA: Diagnosis not present

## 2023-04-05 DIAGNOSIS — R531 Weakness: Secondary | ICD-10-CM | POA: Diagnosis not present

## 2023-04-06 DIAGNOSIS — J9621 Acute and chronic respiratory failure with hypoxia: Secondary | ICD-10-CM | POA: Diagnosis not present

## 2023-04-06 DIAGNOSIS — R262 Difficulty in walking, not elsewhere classified: Secondary | ICD-10-CM | POA: Diagnosis not present

## 2023-04-06 DIAGNOSIS — Z9181 History of falling: Secondary | ICD-10-CM | POA: Diagnosis not present

## 2023-04-06 DIAGNOSIS — R2689 Other abnormalities of gait and mobility: Secondary | ICD-10-CM | POA: Diagnosis not present

## 2023-04-06 DIAGNOSIS — Z741 Need for assistance with personal care: Secondary | ICD-10-CM | POA: Diagnosis not present

## 2023-04-06 DIAGNOSIS — E876 Hypokalemia: Secondary | ICD-10-CM | POA: Diagnosis not present

## 2023-04-06 DIAGNOSIS — R531 Weakness: Secondary | ICD-10-CM | POA: Diagnosis not present

## 2023-04-07 DIAGNOSIS — Z9181 History of falling: Secondary | ICD-10-CM | POA: Diagnosis not present

## 2023-04-07 DIAGNOSIS — Z741 Need for assistance with personal care: Secondary | ICD-10-CM | POA: Diagnosis not present

## 2023-04-07 DIAGNOSIS — R531 Weakness: Secondary | ICD-10-CM | POA: Diagnosis not present

## 2023-04-07 DIAGNOSIS — R262 Difficulty in walking, not elsewhere classified: Secondary | ICD-10-CM | POA: Diagnosis not present

## 2023-04-08 ENCOUNTER — Ambulatory Visit: Payer: Medicare Other | Admitting: Family Medicine

## 2023-04-11 ENCOUNTER — Telehealth: Payer: Self-pay

## 2023-04-11 NOTE — Transitions of Care (Post Inpatient/ED Visit) (Signed)
04/11/2023  Name: Gerald Hall MRN: [id number] DOB: Aug 21, 1935  Today's TOC FU Call Status: Today's TOC FU Call Status:: Successful TOC FU Call Competed TOC FU Call Complete Date: 04/11/23  Transition Care Management Follow-up Telephone Call Date of Discharge: 04/08/23 Discharge Facility: Other ([facility]) Name of Other ([facility]) Discharge Facility: [facility] Type of Discharge: Inpatient Admission Primary Inpatient Discharge Diagnosis:: Acute on Chronic Respiratory Failure How have you been since you were released from the hospital?: Better (Per daughter Huntley Dec)  Items Reviewed: Did you receive and understand the discharge instructions provided?: Yes Medications obtained,verified, and reconciled?: Yes (Medications Reviewed) Any new allergies since your discharge?: No Dietary orders reviewed?: No Do you have support at home?: Yes People in Home: child(ren), adult Name of Support/Comfort Primary Source: Huntley Dec  Medications Reviewed Today: Medications Reviewed Today     Reviewed by Jodelle Gross, RN (Case Manager) on 04/11/23 at 1033  Med List Status: <None>   Medication Order Taking? Sig Documenting Provider Last Dose Status Informant  acetaminophen (TYLENOL) 500 MG tablet [id number] No Take 1,000 mg by mouth every 6 (six) hours as needed for mild pain or headache. [provider] Unknown Active Nursing Home Medication Administration Guide (MAG)  albuterol (VENTOLIN HFA) 108 (90 Base) MCG/ACT inhaler [id number] Yes Inhale 2 puffs into the lungs every 4 (four) hours as needed for wheezing. Babs Sciara, MD Taking Active Nursing Home Medication Administration Guide (MAG)  amiodarone (PACERONE) 100 MG tablet [id number]  Take 1 tablet (100 mg total) by mouth daily. Narda Bonds, MD  Active   amLODipine (NORVASC) 2.5 MG tablet [id number] Yes Take 2.5 mg by mouth daily. [provider] Taking Active   aspirin EC 81 MG tablet [id number] Yes Take 1 tablet (81  mg total) by mouth 2 (two) times daily. Swallow whole. Maurilio Lovely D, DO Taking Active Nursing Home Medication Administration Guide (MAG)  budesonide-formoterol [facility]) 160-4.5 MCG/ACT inhaler [id number] Yes Inhale 2 puffs into the lungs 2 (two) times daily. Babs Sciara, MD Taking Active Nursing Home Medication Administration Guide (MAG)  calcium carbonate (OS-CAL) 600 MG TABS tablet [id number] Yes Take 600 mg by mouth daily with breakfast. [provider] Taking Active Nursing Home Medication Administration Guide (MAG)  carvedilol (COREG) 3.125 MG tablet [id number] Yes Take 1 tablet (3.125 mg total) by mouth 2 (two) times daily with a meal. Babs Sciara, MD Taking Active Nursing Home Medication Administration Guide (MAG)  cholecalciferol (VITAMIN D) 400 units TABS tablet [id number] Yes Take 1,000 Units by mouth daily. [provider] Taking Active Nursing Home Medication Administration Guide (MAG)  clonazePAM (KLONOPIN) 0.5 MG tablet [id number] No Take 1 tablet (0.5 mg total) by mouth at bedtime as needed (insomnia). Narda Bonds, MD Unknown Active   Dextromethorphan-guaiFENesin [facility] DM) 30-600 MG TB12 [id number] No Take 1 tablet by mouth daily.  [provider] Unknown Active Nursing Home Medication Administration Guide (MAG)  diclofenac Sodium (VOLTAREN) 1 % GEL [id number] Yes Apply 2 g topically. [provider] Taking Active   feeding supplement, GLUCERNA SHAKE, (GLUCERNA SHAKE) LIQD [id number] Yes Take 237 mLs by mouth 3 (three) times daily between meals. Narda Bonds, MD Taking Active   finasteride (PROSCAR) 5 MG tablet [id number] No TAKE ONE TABLET BY MOUTH EVERY DAY Babs Sciara, MD Unknown Active Nursing Home Medication Administration Guide (MAG)  fluticasone (FLONASE) 50 MCG/ACT nasal spray [id number] No Place 2 sprays into both nostrils at bedtime. Babs Sciara, MD Unknown Active Nursing  Home Medication Administration Guide (MAG)   ipratropium-albuterol (DUONEB) 0.5-2.5 (3) MG/3ML SOLN [id number] Yes Take 3 mLs by nebulization every 4 (four) hours as needed.  Patient taking differently: Take 3 mLs by nebulization every 4 (four) hours as needed (shortness of breath).   Babs Sciara, MD Taking Active Nursing Home Medication Administration Guide (MAG)  JARDIANCE 10 MG TABS tablet [id number] Yes TAKE ONE TABLET BY MOUTH ONCE DAILY BEFORE BREAKFAST  Patient taking differently: Take 10 mg by mouth daily.   Jonelle Sidle, MD Taking Active Nursing Home Medication Administration Guide (MAG)  methimazole (TAPAZOLE) 5 MG tablet [id number] No TAKE ONE TABLET BY MOUTH EVERY DAY Nida, Denman George, MD Unknown Active Nursing Home Medication Administration Guide (MAG)  mirabegron ER (MYRBETRIQ) 50 MG TB24 tablet [id number] No Take 50 mg by mouth daily. [provider] Unknown Active Nursing Home Medication Administration Guide (MAG)  OXYGEN [id number] No Inhale 2 L into the lungs continuous. [provider] Unknown Active Nursing Home Medication Administration Guide (MAG)  oxymetazoline (AFRIN) 0.05 % nasal spray [id number] No Place 1 spray into both nostrils at bedtime. [provider] Unknown Active Nursing Home Medication Administration Guide (MAG)  polyethylene glycol (MIRALAX / GLYCOLAX) 17 g packet [id number] No Take 17 g by mouth daily as needed for mild constipation. [provider] Unknown Active Nursing Home Medication Administration Guide (MAG)  polyvinyl alcohol (LIQUIFILM TEARS) 1.4 % ophthalmic solution [id number] No Place 1 drop into both eyes as needed for dry eyes. [provider] Unknown Active Nursing Home Medication Administration Guide (MAG)  potassium chloride (KLOR-CON) 10 MEQ tablet [id number] Yes Take 10 mEq by mouth once. [provider] Taking Active   pregabalin (LYRICA) 25 MG capsule [id number] Yes Take 1 capsule (25 mg total) by mouth 2 (two) times daily.  Narda Bonds, MD Taking Active   rOPINIRole (REQUIP) 2 MG tablet [id number]  Take 1 tablet (2 mg total) by mouth 2 (two) times daily.  Patient taking differently: Take 2 mg by mouth at bedtime. 1 tablet in the afternoon as needed  and 1 tablet at bedtime   Maurilio Lovely D, DO  Expired 03/02/23 2359 Nursing Home Medication Administration Guide (MAG)  tamsulosin (FLOMAX) 0.4 MG CAPS capsule [id number] Yes TAKE (1) CAPSULE BY MOUTH TWICE DAILY.  Patient taking differently: Take 0.4 mg by mouth 2 (two) times daily.   Babs Sciara, MD Taking Active Nursing Home Medication Administration Guide (MAG)  torsemide (DEMADEX) 10 MG tablet [id number]  Take 1 tablet (10 mg total) by mouth every other day. Arrien, York Ram, MD  Expired 03/08/23 2359 Nursing Home Medication Administration Guide (MAG)  traMADol (ULTRAM) 50 MG tablet [id number] Yes Take 1 tablet (50 mg total) by mouth 3 (three) times daily as needed for moderate pain or severe pain. Narda Bonds, MD Taking Active   traZODone (DESYREL) 50 MG tablet [id number] Yes Take 1.5 tablets by mouth at bedtime. [provider] Taking Active   vitamin C (ASCORBIC ACID) 500 MG tablet [id number] No Take 500 mg by mouth daily. [provider] Unknown Active Nursing Home Medication Administration Guide (MAG)  Zinc Acetate, Oral, (ZINC ACETATE PO) [id number] No Take 1 tablet by mouth daily. [provider] Unknown Active Nursing Home Medication Administration Guide (MAG)  Med List Note [place], [place] 81/82/99 3716): Pt             Home Care and Equipment/Supplies: Were Home Health Services Ordered?: Yes Name of Home Health Agency::  [facility] Has Agency set up a time to come to your home?: No EMR reviewed for Home Health Orders:  (Daughter waiting for call) Any new equipment or medical supplies ordered?: No  Functional Questionnaire: Do you need assistance with bathing/showering or dressing?: Yes Do you need assistance  with meal preparation?: Yes Do you need assistance with eating?: No Do you have difficulty maintaining continence: No Do you need assistance with getting out of bed/getting out of a chair/moving?: No Do you have difficulty managing or taking your medications?: Yes  Follow up appointments reviewed: PCP Follow-up appointment confirmed?: Yes Date of PCP follow-up appointment?: 04/14/23 Follow-up Provider: Dr. Gerda Diss Specialist [facility] Follow-up appointment confirmed?: NA Do you need transportation to your follow-up appointment?: No Do you understand care options if your condition(s) worsen?: Yes-patient verbalized understanding  SDOH Interventions Today    Flowsheet Row Most Recent Value  SDOH Interventions   Food Insecurity Interventions Intervention Not Indicated  Housing Interventions Intervention Not Indicated  Transportation Interventions Intervention Not Indicated  Utilities Interventions Intervention Not Indicated     Jodelle Gross, RN, BSN, CCM Care Management Coordinator [facility]

## 2023-04-13 ENCOUNTER — Ambulatory Visit: Payer: Medicare Other | Admitting: Family Medicine

## 2023-04-14 ENCOUNTER — Ambulatory Visit (INDEPENDENT_AMBULATORY_CARE_PROVIDER_SITE_OTHER): Payer: Medicare Other | Admitting: Family Medicine

## 2023-04-14 VITALS — BP 114/68 | HR 67 | Wt 199.0 lb

## 2023-04-14 DIAGNOSIS — D509 Iron deficiency anemia, unspecified: Secondary | ICD-10-CM

## 2023-04-14 DIAGNOSIS — I5022 Chronic systolic (congestive) heart failure: Secondary | ICD-10-CM | POA: Diagnosis not present

## 2023-04-14 DIAGNOSIS — M5432 Sciatica, left side: Secondary | ICD-10-CM | POA: Diagnosis not present

## 2023-04-14 DIAGNOSIS — N289 Disorder of kidney and ureter, unspecified: Secondary | ICD-10-CM | POA: Diagnosis not present

## 2023-04-14 DIAGNOSIS — Z8781 Personal history of (healed) traumatic fracture: Secondary | ICD-10-CM

## 2023-04-14 DIAGNOSIS — R600 Localized edema: Secondary | ICD-10-CM

## 2023-04-14 DIAGNOSIS — N1832 Chronic kidney disease, stage 3b: Secondary | ICD-10-CM

## 2023-04-14 NOTE — Progress Notes (Addendum)
   Subjective:    Patient ID: Gerald Hall, male    DOB: 05-06-1935, 88 y.o.   MRN: [id number]  HPI Patient arrives today for 1 month. Patient states he is having pain on his left side. Patient state he is having frequency of urine at night. Patient is having swelling in both feet.  Pain on his left side of the hip radiating into the leg He is having frequency of urination at night He was on Mybetriq but while in the hospital he did have some delirium multiple medications were discontinued at that time  He also had recent hip fracture and surgery by Dr.Cairns He is followed by cardiology for cardiac issues  Review of Systems     Objective:   Physical Exam General-in no acute distress Eyes-no discharge Lungs-respiratory rate normal, CTA CV-no murmurs,RRR Extremities skin warm dry mild around the ankles edema Neuro grossly normal Behavior normal, alert Some pedal edema is noted       Assessment & Plan:  1. Renal insufficiency Will follow closely check metabolic 7 before next visit - Basic Metabolic Panel  2. Iron deficiency anemia, unspecified iron deficiency anemia type Check lab work before next visit patient was anemic after having his hospitalization and surgery - Ferritin  3. Pedal edema Some pedal edema but not severe we will follow this closely - CBC with Differential  4. Heart failure with mildly reduced ejection fraction (HFmrEF) (HCC) I doubt that the pedal edema is due to heart failure I think is more positional and the fact that he is not mobile on a regular basis he will benefit from physical therapy and Occupational Therapy but this starts this coming Monday we will sign off on the orders patient is homebound  5. Stage 3b chronic kidney disease (HCC) Metabolic 7 before next visit  6. Sciatica, left side Referral to Dr. Alvester Morin for further evaluation  7. History of hip fracture Referral to Dr.Cairns for follow-up  Continue follow-up with [place] in 4 to 6  weeks  Due to patient having delirium his bladder medicine was stopped because of anticholinergic effects

## 2023-04-15 ENCOUNTER — Telehealth: Payer: Self-pay | Admitting: Family Medicine

## 2023-04-15 ENCOUNTER — Encounter: Payer: Self-pay | Admitting: Family Medicine

## 2023-04-15 NOTE — Telephone Encounter (Signed)
I am sending patient MyChart message regarding orthopedics  Oliver Barre, MD  Babs Sciara, MD Gerald Hall  Thanks for reaching out.  Glad he is out of rehab.  I have only seen him once since surgery, 3-4 weeks postop.  Realistically, if he is doing better, he does not have to return to see me in clinic.  However, if there are any questions at all, I am happy to see him in clinic.  I can appreciate that it is difficult to get to clinic to see me, so I do not want them to think he has to come back.  If there are questions, please have them contact my clinic.  Thanks [employer]

## 2023-04-19 DIAGNOSIS — E876 Hypokalemia: Secondary | ICD-10-CM | POA: Diagnosis not present

## 2023-04-19 DIAGNOSIS — Z7951 Long term (current) use of inhaled steroids: Secondary | ICD-10-CM | POA: Diagnosis not present

## 2023-04-19 DIAGNOSIS — F17210 Nicotine dependence, cigarettes, uncomplicated: Secondary | ICD-10-CM | POA: Diagnosis not present

## 2023-04-19 DIAGNOSIS — Z79891 Long term (current) use of opiate analgesic: Secondary | ICD-10-CM | POA: Diagnosis not present

## 2023-04-19 DIAGNOSIS — J449 Chronic obstructive pulmonary disease, unspecified: Secondary | ICD-10-CM | POA: Diagnosis not present

## 2023-04-19 DIAGNOSIS — Z96642 Presence of left artificial hip joint: Secondary | ICD-10-CM | POA: Diagnosis not present

## 2023-04-19 DIAGNOSIS — S72002D Fracture of unspecified part of neck of left femur, subsequent encounter for closed fracture with routine healing: Secondary | ICD-10-CM | POA: Diagnosis not present

## 2023-04-19 DIAGNOSIS — M4802 Spinal stenosis, cervical region: Secondary | ICD-10-CM | POA: Diagnosis not present

## 2023-04-19 DIAGNOSIS — J9611 Chronic respiratory failure with hypoxia: Secondary | ICD-10-CM | POA: Diagnosis not present

## 2023-04-19 DIAGNOSIS — M159 Polyosteoarthritis, unspecified: Secondary | ICD-10-CM | POA: Diagnosis not present

## 2023-04-19 DIAGNOSIS — M6281 Muscle weakness (generalized): Secondary | ICD-10-CM | POA: Diagnosis not present

## 2023-04-19 DIAGNOSIS — Z7984 Long term (current) use of oral hypoglycemic drugs: Secondary | ICD-10-CM | POA: Diagnosis not present

## 2023-04-19 DIAGNOSIS — J432 Centrilobular emphysema: Secondary | ICD-10-CM | POA: Diagnosis not present

## 2023-04-19 DIAGNOSIS — Z9981 Dependence on supplemental oxygen: Secondary | ICD-10-CM | POA: Diagnosis not present

## 2023-04-20 NOTE — Addendum Note (Signed)
Addended by: Margaretha Sheffield on: 04/20/2023 11:48 AM   Modules accepted: Orders

## 2023-04-21 ENCOUNTER — Encounter: Payer: Self-pay | Admitting: Cardiology

## 2023-04-21 ENCOUNTER — Telehealth: Payer: Self-pay | Admitting: Physician Assistant

## 2023-04-21 NOTE — Telephone Encounter (Signed)
[facility] Case manager called to state that the patient recently got home from a hospitalization and rehab for symptomatic afib. The patient was given 200 mg of Amiodarone while there. The case manger noted that he was on 100 mg previously and would like to know if the patient should go back to the 100 mg. He has a follow up on August 1st.  She stated that she will tell the patient to stay on the 200 mg for now since that is what he has been on for the past month. The Case Manager would like for the patient to be contacted for any changes or an earlier visit if needed.

## 2023-04-21 NOTE — Telephone Encounter (Signed)
Pt c/o medication issue:  1. Name of Medication:   amiodarone (PACERONE) 100 MG tablet    2. How are you currently taking this medication (dosage and times per day)? Not sure   3. Are you having a reaction (difficulty breathing--STAT)?   4. What is your medication issue? Tamela Oddi, nurse case manager at [facility] called in stating pt was recently in the hospital and they had him on 200mg  however the last time he saw R. Dunn, PA, he gave him 100mg . Pt would like clarification on which one he should be taking.

## 2023-04-21 NOTE — Telephone Encounter (Signed)
Error

## 2023-04-21 NOTE — Telephone Encounter (Signed)
The patient, daughter and son in law have been made aware. The patient stated that he has been taking the 100 mg since being home and was not aware that [facility] had been giving him the 200 mg tablet.  He has been advised to call back if he feels he needs a sooner appointment.

## 2023-04-21 NOTE — Telephone Encounter (Signed)
Patient has a history of SVT and lone A-fib complicated by QT prolongation.  QT has been stable on low-dose amiodarone 100 mg.  Recommend patient go back down on amiodarone to 100 mg daily.

## 2023-04-21 NOTE — Telephone Encounter (Signed)
Left a message for the [facility] to call back.

## 2023-04-22 DIAGNOSIS — Z96642 Presence of left artificial hip joint: Secondary | ICD-10-CM | POA: Diagnosis not present

## 2023-04-22 DIAGNOSIS — S72002D Fracture of unspecified part of neck of left femur, subsequent encounter for closed fracture with routine healing: Secondary | ICD-10-CM | POA: Diagnosis not present

## 2023-04-22 DIAGNOSIS — Z9981 Dependence on supplemental oxygen: Secondary | ICD-10-CM | POA: Diagnosis not present

## 2023-04-22 DIAGNOSIS — Z7984 Long term (current) use of oral hypoglycemic drugs: Secondary | ICD-10-CM | POA: Diagnosis not present

## 2023-04-22 DIAGNOSIS — M6281 Muscle weakness (generalized): Secondary | ICD-10-CM | POA: Diagnosis not present

## 2023-04-22 DIAGNOSIS — F17210 Nicotine dependence, cigarettes, uncomplicated: Secondary | ICD-10-CM | POA: Diagnosis not present

## 2023-04-22 DIAGNOSIS — Z79891 Long term (current) use of opiate analgesic: Secondary | ICD-10-CM | POA: Diagnosis not present

## 2023-04-22 DIAGNOSIS — M159 Polyosteoarthritis, unspecified: Secondary | ICD-10-CM | POA: Diagnosis not present

## 2023-04-22 DIAGNOSIS — J432 Centrilobular emphysema: Secondary | ICD-10-CM | POA: Diagnosis not present

## 2023-04-22 DIAGNOSIS — E876 Hypokalemia: Secondary | ICD-10-CM | POA: Diagnosis not present

## 2023-04-22 DIAGNOSIS — J449 Chronic obstructive pulmonary disease, unspecified: Secondary | ICD-10-CM | POA: Diagnosis not present

## 2023-04-22 DIAGNOSIS — Z7951 Long term (current) use of inhaled steroids: Secondary | ICD-10-CM | POA: Diagnosis not present

## 2023-04-22 DIAGNOSIS — M4802 Spinal stenosis, cervical region: Secondary | ICD-10-CM | POA: Diagnosis not present

## 2023-04-22 DIAGNOSIS — J9611 Chronic respiratory failure with hypoxia: Secondary | ICD-10-CM | POA: Diagnosis not present

## 2023-04-24 ENCOUNTER — Encounter: Payer: Self-pay | Admitting: Family Medicine

## 2023-04-24 DIAGNOSIS — R0609 Other forms of dyspnea: Secondary | ICD-10-CM | POA: Diagnosis not present

## 2023-04-24 DIAGNOSIS — J432 Centrilobular emphysema: Secondary | ICD-10-CM | POA: Diagnosis not present

## 2023-04-29 DIAGNOSIS — M159 Polyosteoarthritis, unspecified: Secondary | ICD-10-CM | POA: Diagnosis not present

## 2023-04-29 DIAGNOSIS — S72002D Fracture of unspecified part of neck of left femur, subsequent encounter for closed fracture with routine healing: Secondary | ICD-10-CM | POA: Diagnosis not present

## 2023-04-29 DIAGNOSIS — J9611 Chronic respiratory failure with hypoxia: Secondary | ICD-10-CM | POA: Diagnosis not present

## 2023-04-29 DIAGNOSIS — Z96642 Presence of left artificial hip joint: Secondary | ICD-10-CM | POA: Diagnosis not present

## 2023-04-29 DIAGNOSIS — E876 Hypokalemia: Secondary | ICD-10-CM | POA: Diagnosis not present

## 2023-04-29 DIAGNOSIS — Z79891 Long term (current) use of opiate analgesic: Secondary | ICD-10-CM | POA: Diagnosis not present

## 2023-04-29 DIAGNOSIS — M6281 Muscle weakness (generalized): Secondary | ICD-10-CM | POA: Diagnosis not present

## 2023-04-29 DIAGNOSIS — F17210 Nicotine dependence, cigarettes, uncomplicated: Secondary | ICD-10-CM | POA: Diagnosis not present

## 2023-04-29 DIAGNOSIS — J432 Centrilobular emphysema: Secondary | ICD-10-CM | POA: Diagnosis not present

## 2023-04-29 DIAGNOSIS — Z9981 Dependence on supplemental oxygen: Secondary | ICD-10-CM | POA: Diagnosis not present

## 2023-04-29 DIAGNOSIS — Z7984 Long term (current) use of oral hypoglycemic drugs: Secondary | ICD-10-CM | POA: Diagnosis not present

## 2023-04-29 DIAGNOSIS — J449 Chronic obstructive pulmonary disease, unspecified: Secondary | ICD-10-CM | POA: Diagnosis not present

## 2023-04-29 DIAGNOSIS — M4802 Spinal stenosis, cervical region: Secondary | ICD-10-CM | POA: Diagnosis not present

## 2023-04-29 DIAGNOSIS — Z7951 Long term (current) use of inhaled steroids: Secondary | ICD-10-CM | POA: Diagnosis not present

## 2023-05-02 ENCOUNTER — Telehealth: Payer: Self-pay | Admitting: Physician Assistant

## 2023-05-02 NOTE — Telephone Encounter (Signed)
Patient's son in law calling to inform the office they did contact hospice and had scheduled an appointment, but he got better. He says the patient had fallen 4/3, and went to the hospital and they were concerned he was dying. He says the patient was in rehab and had hospital stays. He says then he went to new rehab place after they scheduled with hospice He states after seeing the new PA changes were made and he made a remarkable difference. He says the patient is recovered and doing fine.

## 2023-05-02 NOTE — Telephone Encounter (Signed)
Thank you for the update!

## 2023-05-05 DIAGNOSIS — Z79891 Long term (current) use of opiate analgesic: Secondary | ICD-10-CM | POA: Diagnosis not present

## 2023-05-05 DIAGNOSIS — S72002D Fracture of unspecified part of neck of left femur, subsequent encounter for closed fracture with routine healing: Secondary | ICD-10-CM | POA: Diagnosis not present

## 2023-05-05 DIAGNOSIS — Z96642 Presence of left artificial hip joint: Secondary | ICD-10-CM | POA: Diagnosis not present

## 2023-05-05 DIAGNOSIS — M4802 Spinal stenosis, cervical region: Secondary | ICD-10-CM | POA: Diagnosis not present

## 2023-05-05 DIAGNOSIS — J449 Chronic obstructive pulmonary disease, unspecified: Secondary | ICD-10-CM | POA: Diagnosis not present

## 2023-05-05 DIAGNOSIS — Z7951 Long term (current) use of inhaled steroids: Secondary | ICD-10-CM | POA: Diagnosis not present

## 2023-05-05 DIAGNOSIS — E876 Hypokalemia: Secondary | ICD-10-CM | POA: Diagnosis not present

## 2023-05-05 DIAGNOSIS — F17210 Nicotine dependence, cigarettes, uncomplicated: Secondary | ICD-10-CM | POA: Diagnosis not present

## 2023-05-05 DIAGNOSIS — Z9981 Dependence on supplemental oxygen: Secondary | ICD-10-CM | POA: Diagnosis not present

## 2023-05-05 DIAGNOSIS — J9611 Chronic respiratory failure with hypoxia: Secondary | ICD-10-CM | POA: Diagnosis not present

## 2023-05-05 DIAGNOSIS — M159 Polyosteoarthritis, unspecified: Secondary | ICD-10-CM | POA: Diagnosis not present

## 2023-05-05 DIAGNOSIS — M6281 Muscle weakness (generalized): Secondary | ICD-10-CM | POA: Diagnosis not present

## 2023-05-05 DIAGNOSIS — J432 Centrilobular emphysema: Secondary | ICD-10-CM | POA: Diagnosis not present

## 2023-05-05 DIAGNOSIS — Z7984 Long term (current) use of oral hypoglycemic drugs: Secondary | ICD-10-CM | POA: Diagnosis not present

## 2023-05-06 DIAGNOSIS — J9621 Acute and chronic respiratory failure with hypoxia: Secondary | ICD-10-CM | POA: Diagnosis not present

## 2023-05-06 DIAGNOSIS — R2689 Other abnormalities of gait and mobility: Secondary | ICD-10-CM | POA: Diagnosis not present

## 2023-05-09 ENCOUNTER — Telehealth: Payer: Self-pay

## 2023-05-09 DIAGNOSIS — Z79891 Long term (current) use of opiate analgesic: Secondary | ICD-10-CM | POA: Diagnosis not present

## 2023-05-09 DIAGNOSIS — Z7951 Long term (current) use of inhaled steroids: Secondary | ICD-10-CM | POA: Diagnosis not present

## 2023-05-09 DIAGNOSIS — M6281 Muscle weakness (generalized): Secondary | ICD-10-CM | POA: Diagnosis not present

## 2023-05-09 DIAGNOSIS — J449 Chronic obstructive pulmonary disease, unspecified: Secondary | ICD-10-CM | POA: Diagnosis not present

## 2023-05-09 DIAGNOSIS — M4802 Spinal stenosis, cervical region: Secondary | ICD-10-CM | POA: Diagnosis not present

## 2023-05-09 DIAGNOSIS — E876 Hypokalemia: Secondary | ICD-10-CM | POA: Diagnosis not present

## 2023-05-09 DIAGNOSIS — Z7984 Long term (current) use of oral hypoglycemic drugs: Secondary | ICD-10-CM | POA: Diagnosis not present

## 2023-05-09 DIAGNOSIS — M159 Polyosteoarthritis, unspecified: Secondary | ICD-10-CM | POA: Diagnosis not present

## 2023-05-09 DIAGNOSIS — J9611 Chronic respiratory failure with hypoxia: Secondary | ICD-10-CM | POA: Diagnosis not present

## 2023-05-09 DIAGNOSIS — J432 Centrilobular emphysema: Secondary | ICD-10-CM | POA: Diagnosis not present

## 2023-05-09 DIAGNOSIS — Z96642 Presence of left artificial hip joint: Secondary | ICD-10-CM | POA: Diagnosis not present

## 2023-05-09 DIAGNOSIS — S72002D Fracture of unspecified part of neck of left femur, subsequent encounter for closed fracture with routine healing: Secondary | ICD-10-CM | POA: Diagnosis not present

## 2023-05-09 DIAGNOSIS — Z9981 Dependence on supplemental oxygen: Secondary | ICD-10-CM | POA: Diagnosis not present

## 2023-05-09 DIAGNOSIS — F17210 Nicotine dependence, cigarettes, uncomplicated: Secondary | ICD-10-CM | POA: Diagnosis not present

## 2023-05-09 NOTE — Telephone Encounter (Signed)
[facility] calling need to move the OT this we they are going to see him today.   Guido Sander with [facility] [phone]

## 2023-05-09 NOTE — Telephone Encounter (Signed)
Order provided to [place] with home health OT [phone]

## 2023-05-09 NOTE — Telephone Encounter (Signed)
May have verbal orders to do this

## 2023-05-10 DIAGNOSIS — M4802 Spinal stenosis, cervical region: Secondary | ICD-10-CM | POA: Diagnosis not present

## 2023-05-10 DIAGNOSIS — M159 Polyosteoarthritis, unspecified: Secondary | ICD-10-CM | POA: Diagnosis not present

## 2023-05-10 DIAGNOSIS — J432 Centrilobular emphysema: Secondary | ICD-10-CM | POA: Diagnosis not present

## 2023-05-10 DIAGNOSIS — S72002D Fracture of unspecified part of neck of left femur, subsequent encounter for closed fracture with routine healing: Secondary | ICD-10-CM | POA: Diagnosis not present

## 2023-05-10 DIAGNOSIS — Z9981 Dependence on supplemental oxygen: Secondary | ICD-10-CM | POA: Diagnosis not present

## 2023-05-10 DIAGNOSIS — M6281 Muscle weakness (generalized): Secondary | ICD-10-CM | POA: Diagnosis not present

## 2023-05-10 DIAGNOSIS — J449 Chronic obstructive pulmonary disease, unspecified: Secondary | ICD-10-CM | POA: Diagnosis not present

## 2023-05-10 DIAGNOSIS — F17210 Nicotine dependence, cigarettes, uncomplicated: Secondary | ICD-10-CM | POA: Diagnosis not present

## 2023-05-10 DIAGNOSIS — Z96642 Presence of left artificial hip joint: Secondary | ICD-10-CM | POA: Diagnosis not present

## 2023-05-10 DIAGNOSIS — E876 Hypokalemia: Secondary | ICD-10-CM | POA: Diagnosis not present

## 2023-05-10 DIAGNOSIS — Z7951 Long term (current) use of inhaled steroids: Secondary | ICD-10-CM | POA: Diagnosis not present

## 2023-05-10 DIAGNOSIS — Z7984 Long term (current) use of oral hypoglycemic drugs: Secondary | ICD-10-CM | POA: Diagnosis not present

## 2023-05-10 DIAGNOSIS — Z79891 Long term (current) use of opiate analgesic: Secondary | ICD-10-CM | POA: Diagnosis not present

## 2023-05-10 DIAGNOSIS — J9611 Chronic respiratory failure with hypoxia: Secondary | ICD-10-CM | POA: Diagnosis not present

## 2023-05-11 ENCOUNTER — Ambulatory Visit (INDEPENDENT_AMBULATORY_CARE_PROVIDER_SITE_OTHER): Payer: Medicare Other | Admitting: Physical Medicine and Rehabilitation

## 2023-05-11 ENCOUNTER — Other Ambulatory Visit: Payer: Self-pay

## 2023-05-11 VITALS — BP 133/62 | HR 62

## 2023-05-11 DIAGNOSIS — Z7951 Long term (current) use of inhaled steroids: Secondary | ICD-10-CM | POA: Diagnosis not present

## 2023-05-11 DIAGNOSIS — J449 Chronic obstructive pulmonary disease, unspecified: Secondary | ICD-10-CM | POA: Diagnosis not present

## 2023-05-11 DIAGNOSIS — Z96642 Presence of left artificial hip joint: Secondary | ICD-10-CM | POA: Diagnosis not present

## 2023-05-11 DIAGNOSIS — J432 Centrilobular emphysema: Secondary | ICD-10-CM | POA: Diagnosis not present

## 2023-05-11 DIAGNOSIS — M5416 Radiculopathy, lumbar region: Secondary | ICD-10-CM | POA: Diagnosis not present

## 2023-05-11 DIAGNOSIS — M4802 Spinal stenosis, cervical region: Secondary | ICD-10-CM | POA: Diagnosis not present

## 2023-05-11 DIAGNOSIS — Z7984 Long term (current) use of oral hypoglycemic drugs: Secondary | ICD-10-CM | POA: Diagnosis not present

## 2023-05-11 DIAGNOSIS — S72002D Fracture of unspecified part of neck of left femur, subsequent encounter for closed fracture with routine healing: Secondary | ICD-10-CM | POA: Diagnosis not present

## 2023-05-11 DIAGNOSIS — Z79891 Long term (current) use of opiate analgesic: Secondary | ICD-10-CM | POA: Diagnosis not present

## 2023-05-11 DIAGNOSIS — M159 Polyosteoarthritis, unspecified: Secondary | ICD-10-CM | POA: Diagnosis not present

## 2023-05-11 DIAGNOSIS — Z9981 Dependence on supplemental oxygen: Secondary | ICD-10-CM | POA: Diagnosis not present

## 2023-05-11 DIAGNOSIS — F17210 Nicotine dependence, cigarettes, uncomplicated: Secondary | ICD-10-CM | POA: Diagnosis not present

## 2023-05-11 DIAGNOSIS — J9611 Chronic respiratory failure with hypoxia: Secondary | ICD-10-CM | POA: Diagnosis not present

## 2023-05-11 MED ORDER — METHYLPREDNISOLONE ACETATE 80 MG/ML IJ SUSP
80.0000 mg | Freq: Once | INTRAMUSCULAR | Status: AC
Start: 2023-05-11 — End: 2023-05-11
  Administered 2023-05-11: 80 mg

## 2023-05-11 NOTE — Progress Notes (Signed)
Left L5-S1 injection. Hurts when stands up pain scale 8, when sitting its a 2   Functional Pain Scale - descriptive words and definitions  Distressing (6)    Pain is present/unable to complete most ADLs limited by pain/sleep is difficult and active distraction is only marginal. Moderate range order  Average Pain 2   +Driver, -BT, -Dye Allergies.

## 2023-05-11 NOTE — Patient Instructions (Signed)

## 2023-05-11 NOTE — Procedures (Signed)
Lumbar Epidural Steroid Injection - Interlaminar Approach with Fluoroscopic Guidance  Patient: Gerald Hall      Date of Birth: 1935/06/18 MRN: [id number] PCP: Babs Sciara, MD      Visit Date: 05/11/2023   Universal Protocol:     Consent Given By: the patient  Position: PRONE  Additional Comments: Vital signs were monitored before and after the procedure. Patient was prepped and draped in the usual sterile fashion. The correct patient, procedure, and site was verified.   Injection Procedure Details:   Procedure diagnoses: Lumbar radiculopathy [M54.16]   Meds Administered:  Meds ordered this encounter  Medications   methylPREDNISolone acetate (DEPO-MEDROL) injection 80 mg     Laterality: Left  Location/Site:  L5-S1  Needle: 3.5 in., 20 ga. Tuohy  Needle Placement: Paramedian epidural  Findings:   -Comments: Excellent flow of contrast into the epidural space.  Procedure Details: Using a paramedian approach from the side mentioned above, the region overlying the inferior lamina was localized under fluoroscopic visualization and the soft tissues overlying this structure were infiltrated with 4 ml. of 1% Lidocaine without Epinephrine. The Tuohy needle was inserted into the epidural space using a paramedian approach.   The epidural space was localized using loss of resistance along with counter oblique bi-planar fluoroscopic views.  After negative aspirate for air, blood, and CSF, a 2 ml. volume of Isovue-250 was injected into the epidural space and the flow of contrast was observed. Radiographs were obtained for documentation purposes.    The injectate was administered into the level noted above.   Additional Comments:  No complications occurred Dressing: 2 x 2 sterile gauze and Band-Aid    Post-procedure details: Patient was observed during the procedure. Post-procedure instructions were reviewed.  Patient left the clinic in stable condition.

## 2023-05-16 DIAGNOSIS — E876 Hypokalemia: Secondary | ICD-10-CM | POA: Diagnosis not present

## 2023-05-16 DIAGNOSIS — F17210 Nicotine dependence, cigarettes, uncomplicated: Secondary | ICD-10-CM | POA: Diagnosis not present

## 2023-05-16 DIAGNOSIS — J449 Chronic obstructive pulmonary disease, unspecified: Secondary | ICD-10-CM | POA: Diagnosis not present

## 2023-05-16 DIAGNOSIS — M159 Polyosteoarthritis, unspecified: Secondary | ICD-10-CM | POA: Diagnosis not present

## 2023-05-16 DIAGNOSIS — J432 Centrilobular emphysema: Secondary | ICD-10-CM | POA: Diagnosis not present

## 2023-05-16 DIAGNOSIS — Z7984 Long term (current) use of oral hypoglycemic drugs: Secondary | ICD-10-CM | POA: Diagnosis not present

## 2023-05-16 DIAGNOSIS — Z9981 Dependence on supplemental oxygen: Secondary | ICD-10-CM | POA: Diagnosis not present

## 2023-05-16 DIAGNOSIS — Z79891 Long term (current) use of opiate analgesic: Secondary | ICD-10-CM | POA: Diagnosis not present

## 2023-05-16 DIAGNOSIS — M6281 Muscle weakness (generalized): Secondary | ICD-10-CM | POA: Diagnosis not present

## 2023-05-16 DIAGNOSIS — S72002D Fracture of unspecified part of neck of left femur, subsequent encounter for closed fracture with routine healing: Secondary | ICD-10-CM | POA: Diagnosis not present

## 2023-05-16 DIAGNOSIS — M4802 Spinal stenosis, cervical region: Secondary | ICD-10-CM | POA: Diagnosis not present

## 2023-05-16 DIAGNOSIS — Z7951 Long term (current) use of inhaled steroids: Secondary | ICD-10-CM | POA: Diagnosis not present

## 2023-05-16 DIAGNOSIS — Z96642 Presence of left artificial hip joint: Secondary | ICD-10-CM | POA: Diagnosis not present

## 2023-05-16 DIAGNOSIS — J9611 Chronic respiratory failure with hypoxia: Secondary | ICD-10-CM | POA: Diagnosis not present

## 2023-05-24 DIAGNOSIS — Z79891 Long term (current) use of opiate analgesic: Secondary | ICD-10-CM | POA: Diagnosis not present

## 2023-05-24 DIAGNOSIS — Z7951 Long term (current) use of inhaled steroids: Secondary | ICD-10-CM | POA: Diagnosis not present

## 2023-05-24 DIAGNOSIS — J432 Centrilobular emphysema: Secondary | ICD-10-CM | POA: Diagnosis not present

## 2023-05-24 DIAGNOSIS — S72002D Fracture of unspecified part of neck of left femur, subsequent encounter for closed fracture with routine healing: Secondary | ICD-10-CM | POA: Diagnosis not present

## 2023-05-24 DIAGNOSIS — M6281 Muscle weakness (generalized): Secondary | ICD-10-CM | POA: Diagnosis not present

## 2023-05-24 DIAGNOSIS — J9611 Chronic respiratory failure with hypoxia: Secondary | ICD-10-CM | POA: Diagnosis not present

## 2023-05-24 DIAGNOSIS — E876 Hypokalemia: Secondary | ICD-10-CM | POA: Diagnosis not present

## 2023-05-24 DIAGNOSIS — Z7984 Long term (current) use of oral hypoglycemic drugs: Secondary | ICD-10-CM | POA: Diagnosis not present

## 2023-05-24 DIAGNOSIS — Z96642 Presence of left artificial hip joint: Secondary | ICD-10-CM | POA: Diagnosis not present

## 2023-05-24 DIAGNOSIS — M159 Polyosteoarthritis, unspecified: Secondary | ICD-10-CM | POA: Diagnosis not present

## 2023-05-24 DIAGNOSIS — M4802 Spinal stenosis, cervical region: Secondary | ICD-10-CM | POA: Diagnosis not present

## 2023-05-24 DIAGNOSIS — J449 Chronic obstructive pulmonary disease, unspecified: Secondary | ICD-10-CM | POA: Diagnosis not present

## 2023-05-24 DIAGNOSIS — F17210 Nicotine dependence, cigarettes, uncomplicated: Secondary | ICD-10-CM | POA: Diagnosis not present

## 2023-05-24 DIAGNOSIS — Z9981 Dependence on supplemental oxygen: Secondary | ICD-10-CM | POA: Diagnosis not present

## 2023-05-24 NOTE — Progress Notes (Signed)
Gerald Hall - 88 y.o. male MRN [id number]  Date of birth: [birth year]  Office Visit Note: Visit Date: 05/11/2023 PCP: Babs Sciara, MD Referred by: Babs Sciara, MD  Subjective: Chief Complaint  Patient presents with   Lower Back - Pain   HPI:  Gerald Hall is a 88 y.o. male who comes in today for planned repeat Left L5-S1  Lumbar Interlaminar epidural steroid injection with fluoroscopic guidance.  The patient has failed conservative care including home exercise, medications, time and activity modification.  This injection will be diagnostic and hopefully therapeutic.  Please see requesting physician notes for further details and justification. Patient received more than 50% pain relief from prior injection.   Referring: Ellin Goodie, FNP and [facility], PA-C   ROS Otherwise per HPI.  Assessment & Plan: Visit Diagnoses:    ICD-10-CM   1. Lumbar radiculopathy  M54.16 XR C-ARM NO REPORT    Epidural Steroid injection    methylPREDNISolone acetate (DEPO-MEDROL) injection 80 mg      Plan: No additional findings.   Meds & Orders:  Meds ordered this encounter  Medications   methylPREDNISolone acetate (DEPO-MEDROL) injection 80 mg    Orders Placed This Encounter  Procedures   XR C-ARM NO REPORT   Epidural Steroid injection    Follow-up: Return for visit to requesting provider as needed.   Procedures: No procedures performed  Lumbar Epidural Steroid Injection - Interlaminar Approach with Fluoroscopic Guidance  Patient: Gerald Hall      Date of Birth: 13-Dec-1934 MRN: [id number] PCP: Babs Sciara, MD      Visit Date: 05/11/2023   Universal Protocol:     Consent Given By: the patient  Position: PRONE  Additional Comments: Vital signs were monitored before and after the procedure. Patient was prepped and draped in the usual sterile fashion. The correct patient, procedure, and site was verified.   Injection Procedure Details:   Procedure  diagnoses: Lumbar radiculopathy [M54.16]   Meds Administered:  Meds ordered this encounter  Medications   methylPREDNISolone acetate (DEPO-MEDROL) injection 80 mg     Laterality: Left  Location/Site:  L5-S1  Needle: 3.5 in., 20 ga. Tuohy  Needle Placement: Paramedian epidural  Findings:   -Comments: Excellent flow of contrast into the epidural space.  Procedure Details: Using a paramedian approach from the side mentioned above, the region overlying the inferior lamina was localized under fluoroscopic visualization and the soft tissues overlying this structure were infiltrated with 4 ml. of 1% Lidocaine without Epinephrine. The Tuohy needle was inserted into the epidural space using a paramedian approach.   The epidural space was localized using loss of resistance along with counter oblique bi-planar fluoroscopic views.  After negative aspirate for air, blood, and CSF, a 2 ml. volume of Isovue-250 was injected into the epidural space and the flow of contrast was observed. Radiographs were obtained for documentation purposes.    The injectate was administered into the level noted above.   Additional Comments:  No complications occurred Dressing: 2 x 2 sterile gauze and Band-Aid    Post-procedure details: Patient was observed during the procedure. Post-procedure instructions were reviewed.  Patient left the clinic in stable condition.   Clinical History: MRI LUMBAR SPINE WITHOUT CONTRAST   TECHNIQUE: Multiplanar, multisequence MR imaging of the lumbar spine was performed. No intravenous contrast was administered.   COMPARISON:  Lumbar spine x-rays dated February 12, 2022. MRI lumbar spine dated February 07, 2015.   FINDINGS: Segmentation:  Standard.   Alignment: Unchanged 5 mm anterolisthesis at L4-L5. Unchanged trace retrolisthesis at L5-S1.   Vertebrae: No fracture, evidence of discitis, or bone lesion. New degenerative endplate marrow edema at L2-L3.   Conus  medullaris and cauda equina: Conus extends to the L1 level. Conus and cauda equina appear normal.   Paraspinal and other soft tissues: Bilateral renal simple cysts. No follow-up imaging is recommended. Otherwise negative.   Disc levels:   T12-L1:  Unchanged minimal disc bulging.  No stenosis.   L1-L2:  Unchanged mild disc bulging.  No stenosis.   L2-L3: Progressive moderate disc bulging and endplate spurring with new superimposed small right subarticular extruded disc fragment posterior to the L3 superior endplate (series 13, image 18; series 5, image 6). Progressive bilateral facet arthropathy. Worsened now severe spinal canal stenosis and moderate bilateral neuroforaminal stenosis.   L3-L4: Progressive moderate disc bulging and bilateral facet arthropathy with worsened now moderate spinal canal stenosis. Unchanged mild bilateral neuroforaminal stenosis.   L4-L5: Prior posterior decompression. Progressive moderate disc bulging with new superimposed left subarticular disc extrusion (series 13, image 28; series 5, image 9). Continued large left foraminal disc protrusion. Progressive severe bilateral facet arthropathy. New severe left lateral recess stenosis. Unchanged severe left and mild right neuroforaminal stenosis.   L5-S1: Unchanged small circumferential disc osteophyte complex and mild bilateral facet arthropathy. Unchanged mild bilateral neuroforaminal stenosis. No spinal canal stenosis.   IMPRESSION: 1. Progressive lumbar spondylosis as described above. New severe spinal canal stenosis and moderate bilateral neuroforaminal stenosis at L2-L3. 2. Progressive now moderate spinal canal stenosis at L3-L4. 3. New severe left lateral recess stenosis at L4-L5 due to new large left subarticular disc extrusion. Unchanged large left foraminal disc protrusion at this level with severe left neuroforaminal stenosis.     Electronically Signed   By: Obie Dredge M.D.   On:  03/15/2022 10:27     Objective:  VS:  HT:    WT:   BMI:     BP:133/62  HR:62bpm  TEMP: ( )  RESP:  Physical Exam Vitals and nursing note reviewed.  Constitutional:      General: He is not in acute distress.    Appearance: Normal appearance. He is not ill-appearing.  HENT:     Head: Normocephalic and atraumatic.     Right Ear: External ear normal.     Left Ear: External ear normal.     Nose: No congestion.  Eyes:     Extraocular Movements: Extraocular movements intact.  Cardiovascular:     Rate and Rhythm: Normal rate.     Pulses: Normal pulses.  Pulmonary:     Effort: Pulmonary effort is normal. No respiratory distress.  Abdominal:     General: There is no distension.     Palpations: Abdomen is soft.  Musculoskeletal:        General: No tenderness or signs of injury.     Cervical back: Neck supple.     Right lower leg: No edema.     Left lower leg: No edema.     Comments: Patient has good distal strength without clonus.  Skin:    Findings: No erythema or rash.  Neurological:     General: No focal deficit present.     Mental Status: He is alert and oriented to person, place, and time.     Sensory: No sensory deficit.     Motor: No weakness or abnormal muscle tone.     Coordination: Coordination normal.  Psychiatric:  Mood and Affect: Mood normal.        Behavior: Behavior normal.      Imaging: No results found.

## 2023-05-25 DIAGNOSIS — R0609 Other forms of dyspnea: Secondary | ICD-10-CM | POA: Diagnosis not present

## 2023-05-25 DIAGNOSIS — J432 Centrilobular emphysema: Secondary | ICD-10-CM | POA: Diagnosis not present

## 2023-05-26 ENCOUNTER — Ambulatory Visit: Payer: Medicare Other | Admitting: Physician Assistant

## 2023-05-26 DIAGNOSIS — J432 Centrilobular emphysema: Secondary | ICD-10-CM | POA: Diagnosis not present

## 2023-05-26 DIAGNOSIS — Z7951 Long term (current) use of inhaled steroids: Secondary | ICD-10-CM | POA: Diagnosis not present

## 2023-05-26 DIAGNOSIS — Z7984 Long term (current) use of oral hypoglycemic drugs: Secondary | ICD-10-CM | POA: Diagnosis not present

## 2023-05-26 DIAGNOSIS — J9611 Chronic respiratory failure with hypoxia: Secondary | ICD-10-CM | POA: Diagnosis not present

## 2023-05-26 DIAGNOSIS — J449 Chronic obstructive pulmonary disease, unspecified: Secondary | ICD-10-CM | POA: Diagnosis not present

## 2023-05-26 DIAGNOSIS — S72002D Fracture of unspecified part of neck of left femur, subsequent encounter for closed fracture with routine healing: Secondary | ICD-10-CM | POA: Diagnosis not present

## 2023-05-26 DIAGNOSIS — E876 Hypokalemia: Secondary | ICD-10-CM | POA: Diagnosis not present

## 2023-05-26 DIAGNOSIS — Z96642 Presence of left artificial hip joint: Secondary | ICD-10-CM | POA: Diagnosis not present

## 2023-05-26 DIAGNOSIS — Z79891 Long term (current) use of opiate analgesic: Secondary | ICD-10-CM | POA: Diagnosis not present

## 2023-05-26 DIAGNOSIS — Z9981 Dependence on supplemental oxygen: Secondary | ICD-10-CM | POA: Diagnosis not present

## 2023-05-26 DIAGNOSIS — M4802 Spinal stenosis, cervical region: Secondary | ICD-10-CM | POA: Diagnosis not present

## 2023-05-26 DIAGNOSIS — M6281 Muscle weakness (generalized): Secondary | ICD-10-CM | POA: Diagnosis not present

## 2023-05-26 DIAGNOSIS — F17210 Nicotine dependence, cigarettes, uncomplicated: Secondary | ICD-10-CM | POA: Diagnosis not present

## 2023-05-26 DIAGNOSIS — M159 Polyosteoarthritis, unspecified: Secondary | ICD-10-CM | POA: Diagnosis not present

## 2023-05-30 ENCOUNTER — Telehealth: Payer: Self-pay

## 2023-05-30 ENCOUNTER — Other Ambulatory Visit: Payer: Self-pay | Admitting: Family Medicine

## 2023-05-30 ENCOUNTER — Telehealth: Payer: Self-pay | Admitting: Family Medicine

## 2023-05-30 DIAGNOSIS — N289 Disorder of kidney and ureter, unspecified: Secondary | ICD-10-CM | POA: Diagnosis not present

## 2023-05-30 DIAGNOSIS — D509 Iron deficiency anemia, unspecified: Secondary | ICD-10-CM | POA: Diagnosis not present

## 2023-05-30 DIAGNOSIS — R600 Localized edema: Secondary | ICD-10-CM | POA: Diagnosis not present

## 2023-05-30 MED ORDER — TRAMADOL HCL 50 MG PO TABS: ORAL_TABLET | ORAL | 4 refills | Status: DC

## 2023-05-30 NOTE — Telephone Encounter (Signed)
Patient called and states he needs a refill on  traMADol (ULTRAM) 50 MG tablet Take 1 tablet (50 mg total) by mouth 3 (three) times daily as needed for moderate pain or severe pain.

## 2023-05-30 NOTE — Telephone Encounter (Signed)
Refills was sent to [facility] thank you

## 2023-05-30 NOTE — Telephone Encounter (Signed)
See duplicate message 05/30/23

## 2023-05-30 NOTE — Telephone Encounter (Signed)
Prescription Request  05/30/2023  LOV: Visit date not found  What is the name of the medication or equipment? traMADol (ULTRAM) 50 MG tablet   Have you contacted your pharmacy to request a refill? Yes   Which pharmacy would you like this sent to?  [facility] - [place], [place] - [address] [place] [address] Phone: [phone] Fax: [phone]    Patient notified that their request is being sent to the clinical staff for review and that they should receive a response within 2 business days.   Please advise at Mobile [phone] (mobile)

## 2023-05-30 NOTE — Telephone Encounter (Signed)
Front (This patient has my cell phone number) While I was with the patient I received a call from him.  I was unable to answer.  In the past he has called my number by mistake when trying to call the front office.  Please call him-let him know that I was unable to take his call because I was with the patient.  Please see if there is any issue going on that we can help with-feel free to send any routine concerns issues or questions through a telephone message  If it is something that you all can handle feel free to do so thank you

## 2023-05-31 DIAGNOSIS — S72002D Fracture of unspecified part of neck of left femur, subsequent encounter for closed fracture with routine healing: Secondary | ICD-10-CM | POA: Diagnosis not present

## 2023-05-31 DIAGNOSIS — M4802 Spinal stenosis, cervical region: Secondary | ICD-10-CM | POA: Diagnosis not present

## 2023-05-31 DIAGNOSIS — J432 Centrilobular emphysema: Secondary | ICD-10-CM | POA: Diagnosis not present

## 2023-05-31 DIAGNOSIS — F17210 Nicotine dependence, cigarettes, uncomplicated: Secondary | ICD-10-CM | POA: Diagnosis not present

## 2023-05-31 DIAGNOSIS — J9611 Chronic respiratory failure with hypoxia: Secondary | ICD-10-CM | POA: Diagnosis not present

## 2023-05-31 DIAGNOSIS — Z7984 Long term (current) use of oral hypoglycemic drugs: Secondary | ICD-10-CM | POA: Diagnosis not present

## 2023-05-31 DIAGNOSIS — J449 Chronic obstructive pulmonary disease, unspecified: Secondary | ICD-10-CM | POA: Diagnosis not present

## 2023-05-31 DIAGNOSIS — Z7951 Long term (current) use of inhaled steroids: Secondary | ICD-10-CM | POA: Diagnosis not present

## 2023-05-31 DIAGNOSIS — Z79891 Long term (current) use of opiate analgesic: Secondary | ICD-10-CM | POA: Diagnosis not present

## 2023-05-31 DIAGNOSIS — Z96642 Presence of left artificial hip joint: Secondary | ICD-10-CM | POA: Diagnosis not present

## 2023-05-31 DIAGNOSIS — E876 Hypokalemia: Secondary | ICD-10-CM | POA: Diagnosis not present

## 2023-05-31 DIAGNOSIS — M6281 Muscle weakness (generalized): Secondary | ICD-10-CM | POA: Diagnosis not present

## 2023-05-31 DIAGNOSIS — M159 Polyosteoarthritis, unspecified: Secondary | ICD-10-CM | POA: Diagnosis not present

## 2023-05-31 DIAGNOSIS — Z9981 Dependence on supplemental oxygen: Secondary | ICD-10-CM | POA: Diagnosis not present

## 2023-06-01 ENCOUNTER — Other Ambulatory Visit: Payer: Self-pay | Admitting: Family Medicine

## 2023-06-01 ENCOUNTER — Encounter: Payer: Self-pay | Admitting: Family Medicine

## 2023-06-01 ENCOUNTER — Ambulatory Visit (INDEPENDENT_AMBULATORY_CARE_PROVIDER_SITE_OTHER): Payer: Medicare Other | Admitting: Family Medicine

## 2023-06-01 VITALS — BP 120/78 | HR 65 | Temp 97.2°F | Ht 68.0 in | Wt 200.0 lb

## 2023-06-01 DIAGNOSIS — N289 Disorder of kidney and ureter, unspecified: Secondary | ICD-10-CM | POA: Diagnosis not present

## 2023-06-01 DIAGNOSIS — N433 Hydrocele, unspecified: Secondary | ICD-10-CM

## 2023-06-01 DIAGNOSIS — Z72820 Sleep deprivation: Secondary | ICD-10-CM | POA: Diagnosis not present

## 2023-06-01 NOTE — Progress Notes (Signed)
   Subjective:    Patient ID: Gerald Hall, male    DOB: Aug 28, 1935, 88 y.o.   MRN: [id number]  HPI Patient  reports having a lot of trouble sleeping only sleeping approx 2 to 3 hrs nightly States he lays down late in the evening falls asleep for 3 to 4 hours then he wakes up and then is very difficult to go back to sleep  Right testicle swelling concern-present over the past month seems to be getting bigger and not causing any pain or trouble  Trouble hearing - Hearing aid concern - broken  Has underlying lung and heart issues that are stable currently Patient utilizes wheelchair for mobility  Review of Systems     Objective:   Physical Exam General-in no acute distress Eyes-no discharge Lungs-respiratory rate normal, CTA CV-no murmurs,RRR Extremities skin warm dry no edema Neuro grossly normal Behavior normal, alert        Assessment & Plan:   1. Hydrocele of testis Large hydrocele right side no need for intervention  2. Renal insufficiency Monitor closely currently not in dialysis territory  3. Poor sleep May use melatonin to hold off on any type of sleeping pills

## 2023-06-03 DIAGNOSIS — E876 Hypokalemia: Secondary | ICD-10-CM | POA: Diagnosis not present

## 2023-06-03 DIAGNOSIS — Z9981 Dependence on supplemental oxygen: Secondary | ICD-10-CM | POA: Diagnosis not present

## 2023-06-03 DIAGNOSIS — J449 Chronic obstructive pulmonary disease, unspecified: Secondary | ICD-10-CM | POA: Diagnosis not present

## 2023-06-03 DIAGNOSIS — F17210 Nicotine dependence, cigarettes, uncomplicated: Secondary | ICD-10-CM | POA: Diagnosis not present

## 2023-06-03 DIAGNOSIS — Z79891 Long term (current) use of opiate analgesic: Secondary | ICD-10-CM | POA: Diagnosis not present

## 2023-06-03 DIAGNOSIS — Z7984 Long term (current) use of oral hypoglycemic drugs: Secondary | ICD-10-CM | POA: Diagnosis not present

## 2023-06-03 DIAGNOSIS — M4802 Spinal stenosis, cervical region: Secondary | ICD-10-CM | POA: Diagnosis not present

## 2023-06-03 DIAGNOSIS — J432 Centrilobular emphysema: Secondary | ICD-10-CM | POA: Diagnosis not present

## 2023-06-03 DIAGNOSIS — M159 Polyosteoarthritis, unspecified: Secondary | ICD-10-CM | POA: Diagnosis not present

## 2023-06-03 DIAGNOSIS — Z7951 Long term (current) use of inhaled steroids: Secondary | ICD-10-CM | POA: Diagnosis not present

## 2023-06-03 DIAGNOSIS — J9611 Chronic respiratory failure with hypoxia: Secondary | ICD-10-CM | POA: Diagnosis not present

## 2023-06-03 DIAGNOSIS — M6281 Muscle weakness (generalized): Secondary | ICD-10-CM | POA: Diagnosis not present

## 2023-06-03 DIAGNOSIS — Z96642 Presence of left artificial hip joint: Secondary | ICD-10-CM | POA: Diagnosis not present

## 2023-06-03 DIAGNOSIS — S72002D Fracture of unspecified part of neck of left femur, subsequent encounter for closed fracture with routine healing: Secondary | ICD-10-CM | POA: Diagnosis not present

## 2023-06-06 DIAGNOSIS — J9621 Acute and chronic respiratory failure with hypoxia: Secondary | ICD-10-CM | POA: Diagnosis not present

## 2023-06-06 DIAGNOSIS — R2689 Other abnormalities of gait and mobility: Secondary | ICD-10-CM | POA: Diagnosis not present

## 2023-06-08 DIAGNOSIS — Z96642 Presence of left artificial hip joint: Secondary | ICD-10-CM | POA: Diagnosis not present

## 2023-06-08 DIAGNOSIS — M159 Polyosteoarthritis, unspecified: Secondary | ICD-10-CM | POA: Diagnosis not present

## 2023-06-08 DIAGNOSIS — E876 Hypokalemia: Secondary | ICD-10-CM | POA: Diagnosis not present

## 2023-06-08 DIAGNOSIS — F17210 Nicotine dependence, cigarettes, uncomplicated: Secondary | ICD-10-CM | POA: Diagnosis not present

## 2023-06-08 DIAGNOSIS — J9611 Chronic respiratory failure with hypoxia: Secondary | ICD-10-CM | POA: Diagnosis not present

## 2023-06-08 DIAGNOSIS — J432 Centrilobular emphysema: Secondary | ICD-10-CM | POA: Diagnosis not present

## 2023-06-08 DIAGNOSIS — S72002D Fracture of unspecified part of neck of left femur, subsequent encounter for closed fracture with routine healing: Secondary | ICD-10-CM | POA: Diagnosis not present

## 2023-06-08 DIAGNOSIS — Z7984 Long term (current) use of oral hypoglycemic drugs: Secondary | ICD-10-CM | POA: Diagnosis not present

## 2023-06-08 DIAGNOSIS — Z7951 Long term (current) use of inhaled steroids: Secondary | ICD-10-CM | POA: Diagnosis not present

## 2023-06-08 DIAGNOSIS — M6281 Muscle weakness (generalized): Secondary | ICD-10-CM | POA: Diagnosis not present

## 2023-06-08 DIAGNOSIS — Z9981 Dependence on supplemental oxygen: Secondary | ICD-10-CM | POA: Diagnosis not present

## 2023-06-08 DIAGNOSIS — J449 Chronic obstructive pulmonary disease, unspecified: Secondary | ICD-10-CM | POA: Diagnosis not present

## 2023-06-08 DIAGNOSIS — Z79891 Long term (current) use of opiate analgesic: Secondary | ICD-10-CM | POA: Diagnosis not present

## 2023-06-08 DIAGNOSIS — M4802 Spinal stenosis, cervical region: Secondary | ICD-10-CM | POA: Diagnosis not present

## 2023-06-15 ENCOUNTER — Telehealth: Payer: Medicare Other | Admitting: Family Medicine

## 2023-06-15 ENCOUNTER — Encounter: Payer: Self-pay | Admitting: Nurse Practitioner

## 2023-06-15 ENCOUNTER — Ambulatory Visit (INDEPENDENT_AMBULATORY_CARE_PROVIDER_SITE_OTHER): Payer: Medicare Other | Admitting: Nurse Practitioner

## 2023-06-15 VITALS — BP 120/80 | HR 77 | Temp 97.6°F

## 2023-06-15 DIAGNOSIS — J440 Chronic obstructive pulmonary disease with acute lower respiratory infection: Secondary | ICD-10-CM | POA: Diagnosis not present

## 2023-06-15 DIAGNOSIS — J209 Acute bronchitis, unspecified: Secondary | ICD-10-CM | POA: Diagnosis not present

## 2023-06-15 DIAGNOSIS — R300 Dysuria: Secondary | ICD-10-CM | POA: Diagnosis not present

## 2023-06-15 LAB — POCT URINALYSIS DIP (CLINITEK)
Bilirubin, UA: NEGATIVE
Blood, UA: NEGATIVE
Glucose, UA: NEGATIVE mg/dL
Ketones, POC UA: NEGATIVE mg/dL
Leukocytes, UA: NEGATIVE
Nitrite, UA: POSITIVE — AB
Spec Grav, UA: 1.025 (ref 1.010–1.025)
Urobilinogen, UA: 0.2 E.U./dL
pH, UA: 6 (ref 5.0–8.0)

## 2023-06-15 MED ORDER — CEFDINIR 300 MG PO CAPS: 300.0000 mg | ORAL_CAPSULE | Freq: Two times a day (BID) | ORAL | 0 refills | Status: DC

## 2023-06-15 NOTE — Patient Instructions (Addendum)
Eat Activia yogurt once daily while on antibiotics  Breztri Trelegy

## 2023-06-15 NOTE — Progress Notes (Signed)
Subjective:    Patient ID: Gerald Hall, male    DOB: 29-Nov-1934, 88 y.o.   MRN: [id number]  HPI Patient states he has been feeling week. Patient states it is burning when he urinates and he is having urine frequency.  States he has urinary frequency but this significantly increased 2 days ago when he had to urinate 5-6 times in 1 night.  Last night was much better.  Patient avoids getting up at night due to risk of falls, states he sits on the side of his bed and uses a urinal.  Mild dysuria.  No CVA or flank pain.  No history of recent UTI.  Also having a cough that began about a week ago.  Describes as a "chest cold".  Has taken 3 COVID test all which were negative.  No fever.  Frequent cough producing clear mucus.  Slight wheezing at times.  Chronic sinus pressure, no change.  No ear pain or sore throat.  Some relief with Mucinex.  Currently on generic Symbicort for his COPD and breathing.  Also used his DuoNeb twice yesterday.  Was unsure about using this because they stopped it while he was in rehab due to his kidney function.  Is currently on oxygen, states on room air his O2 sats are in the 90s.  Also has chronic heart failure, no increased edema in his legs.  No orthopnea. In addition patient is on trazodone 50 mg 1-1/2 tabs at bedtime for sleep which is not working well and would like to discuss options.    Review of Systems  Constitutional:  Negative for fever.  HENT:  Positive for postnasal drip and sinus pressure. Negative for ear pain and sore throat.   Respiratory:  Positive for cough and wheezing. Negative for chest tightness and shortness of breath.   Cardiovascular:  Negative for chest pain and leg swelling.  Gastrointestinal:  Negative for constipation and diarrhea.  Genitourinary:  Positive for dysuria, frequency and urgency. Negative for flank pain.  Psychiatric/Behavioral:  Positive for sleep disturbance.        Objective:   Physical Exam NAD alert, oriented.  TMs  mild clear effusion, no erythema.  Pharynx clear and moist.  Neck supple with mild soft anterior adenopathy.  Lungs scattered faint expiratory crackles across lung fields, no consolidation.  1 faint expiratory wheeze noted left upper lobe.  No tachypnea.  Heart regular rate rhythm.  No CVA tenderness.  Abdomen soft nondistended nontender.  Exam was done in his wheelchair. Results for orders placed or performed in visit on 06/15/23  POCT URINALYSIS DIP (CLINITEK)  Result Value Ref Range   Color, UA yellow yellow   Clarity, UA cloudy (A) clear   Glucose, UA negative negative mg/dL   Bilirubin, UA negative negative   Ketones, POC UA negative negative mg/dL   Spec Grav, UA 3.086 5.784 - 1.025   Blood, UA negative negative   pH, UA 6.0 5.0 - 8.0   POC PROTEIN,UA trace negative, trace   Urobilinogen, UA 0.2 0.2 or 1.0 E.U./dL   Nitrite, UA Positive (A) Negative   Leukocytes, UA Negative Negative   Today's Vitals   06/15/23 1431 06/15/23 1528  BP: 120/80   Pulse: (!) 50 77  Temp:  97.6 F (36.4 C)  SpO2: 96% 95%   There is no height or weight on file to calculate BMI.       Assessment & Plan:  Dysuria - Plan: POCT URINALYSIS DIP (CLINITEK), Urine Culture  Acute bronchitis with COPD (HCC) With no chest pain fever and a stable oxygen level will treat with antibiotics.  Also recommend patient does his twice daily Symbicort.  May do DuoNeb twice a day as needed for breakthrough symptoms.  There is a caution about using high doses with kidney disease. Also recommend patient consider switching to Trelegy or Breztri. Meds ordered this encounter  Medications   cefdinir (OMNICEF) 300 MG capsule    Sig: Take 1 capsule (300 mg total) by mouth 2 (two) times daily. X 5 days    Dispense:  10 capsule    Refill:  0    Order Specific Question:   Supervising Provider    Answer:   Lilyan Punt A [[id number]]   Continue Mucinex as directed.  His CNA is with him today.  Continue to monitor oxygen level.   Warning signs reviewed including fever chest pain vomiting shortness of breath. Further follow-up based on urine culture result.  Call back sooner or go to ED if worse. Continue trazodone as directed for now, will examine other options for his sleep.

## 2023-06-17 DIAGNOSIS — F17210 Nicotine dependence, cigarettes, uncomplicated: Secondary | ICD-10-CM | POA: Diagnosis not present

## 2023-06-17 DIAGNOSIS — J432 Centrilobular emphysema: Secondary | ICD-10-CM | POA: Diagnosis not present

## 2023-06-17 DIAGNOSIS — Z9981 Dependence on supplemental oxygen: Secondary | ICD-10-CM | POA: Diagnosis not present

## 2023-06-17 DIAGNOSIS — S72002D Fracture of unspecified part of neck of left femur, subsequent encounter for closed fracture with routine healing: Secondary | ICD-10-CM | POA: Diagnosis not present

## 2023-06-17 DIAGNOSIS — Z7984 Long term (current) use of oral hypoglycemic drugs: Secondary | ICD-10-CM | POA: Diagnosis not present

## 2023-06-17 DIAGNOSIS — M4802 Spinal stenosis, cervical region: Secondary | ICD-10-CM | POA: Diagnosis not present

## 2023-06-17 DIAGNOSIS — Z7951 Long term (current) use of inhaled steroids: Secondary | ICD-10-CM | POA: Diagnosis not present

## 2023-06-17 DIAGNOSIS — M6281 Muscle weakness (generalized): Secondary | ICD-10-CM | POA: Diagnosis not present

## 2023-06-17 DIAGNOSIS — Z96642 Presence of left artificial hip joint: Secondary | ICD-10-CM | POA: Diagnosis not present

## 2023-06-17 DIAGNOSIS — M159 Polyosteoarthritis, unspecified: Secondary | ICD-10-CM | POA: Diagnosis not present

## 2023-06-17 DIAGNOSIS — J9611 Chronic respiratory failure with hypoxia: Secondary | ICD-10-CM | POA: Diagnosis not present

## 2023-06-17 DIAGNOSIS — E876 Hypokalemia: Secondary | ICD-10-CM | POA: Diagnosis not present

## 2023-06-17 DIAGNOSIS — Z79891 Long term (current) use of opiate analgesic: Secondary | ICD-10-CM | POA: Diagnosis not present

## 2023-06-17 DIAGNOSIS — J449 Chronic obstructive pulmonary disease, unspecified: Secondary | ICD-10-CM | POA: Diagnosis not present

## 2023-06-17 LAB — URINE CULTURE

## 2023-06-17 LAB — SPECIMEN STATUS REPORT

## 2023-06-21 DIAGNOSIS — Z85828 Personal history of other malignant neoplasm of skin: Secondary | ICD-10-CM | POA: Diagnosis not present

## 2023-06-21 DIAGNOSIS — Z872 Personal history of diseases of the skin and subcutaneous tissue: Secondary | ICD-10-CM | POA: Diagnosis not present

## 2023-06-21 DIAGNOSIS — L570 Actinic keratosis: Secondary | ICD-10-CM | POA: Diagnosis not present

## 2023-06-21 DIAGNOSIS — C4441 Basal cell carcinoma of skin of scalp and neck: Secondary | ICD-10-CM | POA: Diagnosis not present

## 2023-06-21 DIAGNOSIS — Z859 Personal history of malignant neoplasm, unspecified: Secondary | ICD-10-CM | POA: Diagnosis not present

## 2023-06-21 DIAGNOSIS — L578 Other skin changes due to chronic exposure to nonionizing radiation: Secondary | ICD-10-CM | POA: Diagnosis not present

## 2023-06-21 DIAGNOSIS — D485 Neoplasm of uncertain behavior of skin: Secondary | ICD-10-CM | POA: Diagnosis not present

## 2023-06-24 DIAGNOSIS — J432 Centrilobular emphysema: Secondary | ICD-10-CM | POA: Diagnosis not present

## 2023-06-24 DIAGNOSIS — J449 Chronic obstructive pulmonary disease, unspecified: Secondary | ICD-10-CM | POA: Diagnosis not present

## 2023-06-24 DIAGNOSIS — Z79891 Long term (current) use of opiate analgesic: Secondary | ICD-10-CM | POA: Diagnosis not present

## 2023-06-24 DIAGNOSIS — F17210 Nicotine dependence, cigarettes, uncomplicated: Secondary | ICD-10-CM | POA: Diagnosis not present

## 2023-06-24 DIAGNOSIS — S72002D Fracture of unspecified part of neck of left femur, subsequent encounter for closed fracture with routine healing: Secondary | ICD-10-CM | POA: Diagnosis not present

## 2023-06-24 DIAGNOSIS — M4802 Spinal stenosis, cervical region: Secondary | ICD-10-CM | POA: Diagnosis not present

## 2023-06-24 DIAGNOSIS — M159 Polyosteoarthritis, unspecified: Secondary | ICD-10-CM | POA: Diagnosis not present

## 2023-06-24 DIAGNOSIS — Z7951 Long term (current) use of inhaled steroids: Secondary | ICD-10-CM | POA: Diagnosis not present

## 2023-06-24 DIAGNOSIS — Z96642 Presence of left artificial hip joint: Secondary | ICD-10-CM | POA: Diagnosis not present

## 2023-06-24 DIAGNOSIS — M6281 Muscle weakness (generalized): Secondary | ICD-10-CM | POA: Diagnosis not present

## 2023-06-24 DIAGNOSIS — E876 Hypokalemia: Secondary | ICD-10-CM | POA: Diagnosis not present

## 2023-06-24 DIAGNOSIS — Z9981 Dependence on supplemental oxygen: Secondary | ICD-10-CM | POA: Diagnosis not present

## 2023-06-24 DIAGNOSIS — Z7984 Long term (current) use of oral hypoglycemic drugs: Secondary | ICD-10-CM | POA: Diagnosis not present

## 2023-06-24 DIAGNOSIS — J9611 Chronic respiratory failure with hypoxia: Secondary | ICD-10-CM | POA: Diagnosis not present

## 2023-06-25 DIAGNOSIS — J432 Centrilobular emphysema: Secondary | ICD-10-CM | POA: Diagnosis not present

## 2023-06-25 DIAGNOSIS — R0609 Other forms of dyspnea: Secondary | ICD-10-CM | POA: Diagnosis not present

## 2023-06-28 DIAGNOSIS — Z7951 Long term (current) use of inhaled steroids: Secondary | ICD-10-CM | POA: Diagnosis not present

## 2023-06-28 DIAGNOSIS — M4802 Spinal stenosis, cervical region: Secondary | ICD-10-CM | POA: Diagnosis not present

## 2023-06-28 DIAGNOSIS — F17210 Nicotine dependence, cigarettes, uncomplicated: Secondary | ICD-10-CM | POA: Diagnosis not present

## 2023-06-28 DIAGNOSIS — Z96642 Presence of left artificial hip joint: Secondary | ICD-10-CM | POA: Diagnosis not present

## 2023-06-28 DIAGNOSIS — M6281 Muscle weakness (generalized): Secondary | ICD-10-CM | POA: Diagnosis not present

## 2023-06-28 DIAGNOSIS — Z9981 Dependence on supplemental oxygen: Secondary | ICD-10-CM | POA: Diagnosis not present

## 2023-06-28 DIAGNOSIS — S72002D Fracture of unspecified part of neck of left femur, subsequent encounter for closed fracture with routine healing: Secondary | ICD-10-CM | POA: Diagnosis not present

## 2023-06-28 DIAGNOSIS — M159 Polyosteoarthritis, unspecified: Secondary | ICD-10-CM | POA: Diagnosis not present

## 2023-06-28 DIAGNOSIS — J432 Centrilobular emphysema: Secondary | ICD-10-CM | POA: Diagnosis not present

## 2023-06-28 DIAGNOSIS — Z7984 Long term (current) use of oral hypoglycemic drugs: Secondary | ICD-10-CM | POA: Diagnosis not present

## 2023-06-28 DIAGNOSIS — J9611 Chronic respiratory failure with hypoxia: Secondary | ICD-10-CM | POA: Diagnosis not present

## 2023-06-28 DIAGNOSIS — E876 Hypokalemia: Secondary | ICD-10-CM | POA: Diagnosis not present

## 2023-07-04 ENCOUNTER — Other Ambulatory Visit: Payer: Self-pay | Admitting: Family Medicine

## 2023-07-05 ENCOUNTER — Telehealth: Payer: Self-pay | Admitting: Family Medicine

## 2023-07-05 ENCOUNTER — Other Ambulatory Visit: Payer: Self-pay

## 2023-07-05 DIAGNOSIS — E876 Hypokalemia: Secondary | ICD-10-CM | POA: Diagnosis not present

## 2023-07-05 DIAGNOSIS — M6281 Muscle weakness (generalized): Secondary | ICD-10-CM | POA: Diagnosis not present

## 2023-07-05 DIAGNOSIS — J9611 Chronic respiratory failure with hypoxia: Secondary | ICD-10-CM | POA: Diagnosis not present

## 2023-07-05 DIAGNOSIS — Z7984 Long term (current) use of oral hypoglycemic drugs: Secondary | ICD-10-CM | POA: Diagnosis not present

## 2023-07-05 DIAGNOSIS — Z7951 Long term (current) use of inhaled steroids: Secondary | ICD-10-CM | POA: Diagnosis not present

## 2023-07-05 DIAGNOSIS — Z96642 Presence of left artificial hip joint: Secondary | ICD-10-CM | POA: Diagnosis not present

## 2023-07-05 DIAGNOSIS — M4802 Spinal stenosis, cervical region: Secondary | ICD-10-CM | POA: Diagnosis not present

## 2023-07-05 DIAGNOSIS — J432 Centrilobular emphysema: Secondary | ICD-10-CM | POA: Diagnosis not present

## 2023-07-05 DIAGNOSIS — Z9981 Dependence on supplemental oxygen: Secondary | ICD-10-CM | POA: Diagnosis not present

## 2023-07-05 DIAGNOSIS — M159 Polyosteoarthritis, unspecified: Secondary | ICD-10-CM | POA: Diagnosis not present

## 2023-07-05 DIAGNOSIS — F17210 Nicotine dependence, cigarettes, uncomplicated: Secondary | ICD-10-CM | POA: Diagnosis not present

## 2023-07-05 DIAGNOSIS — S72002D Fracture of unspecified part of neck of left femur, subsequent encounter for closed fracture with routine healing: Secondary | ICD-10-CM | POA: Diagnosis not present

## 2023-07-05 NOTE — Telephone Encounter (Signed)
Left message for patient to return the call for additional details and recommendations.   

## 2023-07-05 NOTE — Telephone Encounter (Signed)
Home Health [facility]- calling to let you know patient had a fall on 9/5 but no injuries. The nurse accidentally let him fall out his wheelchair on to carpet( [phone] in any question.

## 2023-07-05 NOTE — Telephone Encounter (Signed)
Prescription Request  07/05/2023  LOV: Visit date not found  What is the name of the medication or equipment? Trazodone 50 mg  tablet  Have you contacted your pharmacy to request a refill? Yes   Which pharmacy would you like this sent to?  [facility] - [place], [place] - [address] [place] [address] Phone: [phone] Fax: [phone]    Patient notified that their request is being sent to the clinical staff for review and that they should receive a response within 2 business days.   Please advise at Mobile [phone] (mobile)

## 2023-07-06 NOTE — Telephone Encounter (Signed)
Noted thanks °

## 2023-07-07 DIAGNOSIS — R2689 Other abnormalities of gait and mobility: Secondary | ICD-10-CM | POA: Diagnosis not present

## 2023-07-07 DIAGNOSIS — J9621 Acute and chronic respiratory failure with hypoxia: Secondary | ICD-10-CM | POA: Diagnosis not present

## 2023-07-08 NOTE — Telephone Encounter (Signed)
I am ok with this med Trazadone 50 mg, #30, 1/2 to 1 at bedtime use lower dose if effective caution drowsiness, 0 RF keep follow up visit

## 2023-07-08 NOTE — Addendum Note (Signed)
Addended by: Elizbeth Squires on: 07/08/2023 11:21 AM   Modules accepted: Orders

## 2023-07-09 ENCOUNTER — Other Ambulatory Visit: Payer: Self-pay | Admitting: Family Medicine

## 2023-07-09 MED ORDER — MOLNUPIRAVIR EUA 200MG CAPSULE: 4.0000 | ORAL_CAPSULE | Freq: Two times a day (BID) | ORAL | 0 refills | Status: AC

## 2023-07-10 NOTE — Telephone Encounter (Signed)
I would not recommend renewing this medicine-please notify pharmacy that there is drug interactions with tramadol

## 2023-07-11 DIAGNOSIS — J432 Centrilobular emphysema: Secondary | ICD-10-CM | POA: Diagnosis not present

## 2023-07-11 DIAGNOSIS — M6281 Muscle weakness (generalized): Secondary | ICD-10-CM | POA: Diagnosis not present

## 2023-07-11 DIAGNOSIS — M4802 Spinal stenosis, cervical region: Secondary | ICD-10-CM | POA: Diagnosis not present

## 2023-07-11 DIAGNOSIS — M159 Polyosteoarthritis, unspecified: Secondary | ICD-10-CM | POA: Diagnosis not present

## 2023-07-11 DIAGNOSIS — Z9981 Dependence on supplemental oxygen: Secondary | ICD-10-CM | POA: Diagnosis not present

## 2023-07-11 DIAGNOSIS — Z7984 Long term (current) use of oral hypoglycemic drugs: Secondary | ICD-10-CM | POA: Diagnosis not present

## 2023-07-11 DIAGNOSIS — Z96642 Presence of left artificial hip joint: Secondary | ICD-10-CM | POA: Diagnosis not present

## 2023-07-11 DIAGNOSIS — S72002D Fracture of unspecified part of neck of left femur, subsequent encounter for closed fracture with routine healing: Secondary | ICD-10-CM | POA: Diagnosis not present

## 2023-07-11 DIAGNOSIS — Z7951 Long term (current) use of inhaled steroids: Secondary | ICD-10-CM | POA: Diagnosis not present

## 2023-07-11 DIAGNOSIS — J9611 Chronic respiratory failure with hypoxia: Secondary | ICD-10-CM | POA: Diagnosis not present

## 2023-07-11 DIAGNOSIS — F17210 Nicotine dependence, cigarettes, uncomplicated: Secondary | ICD-10-CM | POA: Diagnosis not present

## 2023-07-11 NOTE — Telephone Encounter (Signed)
Called and spoke with pharmacy and advised  I would not recommend renewing this medicine-please notify pharmacy that there is drug interactions with tramadol  Pharmacy verbalized understanding.

## 2023-07-11 NOTE — Addendum Note (Signed)
Addended by: Elizbeth Squires on: 07/11/2023 02:37 PM   Modules accepted: Orders

## 2023-07-14 ENCOUNTER — Ambulatory Visit: Payer: Medicare Other | Admitting: Family Medicine

## 2023-07-14 ENCOUNTER — Other Ambulatory Visit (HOSPITAL_COMMUNITY)
Admission: RE | Admit: 2023-07-14 | Discharge: 2023-07-14 | Disposition: A | Discharge status: Home / Self Care | Payer: Medicare Other | Source: Ambulatory Visit | Attending: Family Medicine | Admitting: Family Medicine

## 2023-07-14 ENCOUNTER — Ambulatory Visit (HOSPITAL_COMMUNITY)
Admission: RE | Admit: 2023-07-14 | Discharge: 2023-07-14 | Disposition: A | Discharge status: Home / Self Care | Payer: Medicare Other | Source: Ambulatory Visit | Attending: Family Medicine | Admitting: Family Medicine

## 2023-07-14 VITALS — BP 106/70

## 2023-07-14 DIAGNOSIS — U071 COVID-19: Secondary | ICD-10-CM | POA: Insufficient documentation

## 2023-07-14 DIAGNOSIS — J189 Pneumonia, unspecified organism: Secondary | ICD-10-CM | POA: Diagnosis not present

## 2023-07-14 DIAGNOSIS — R059 Cough, unspecified: Secondary | ICD-10-CM | POA: Diagnosis not present

## 2023-07-14 DIAGNOSIS — R918 Other nonspecific abnormal finding of lung field: Secondary | ICD-10-CM | POA: Diagnosis not present

## 2023-07-14 LAB — BASIC METABOLIC PANEL
Anion gap: 8 (ref 5–15)
BUN: 26 mg/dL — ABNORMAL HIGH (ref 8–23)
CO2: 25 mmol/L (ref 22–32)
Calcium: 9.1 mg/dL (ref 8.9–10.3)
Chloride: 110 mmol/L (ref 98–111)
Creatinine, Ser: 1.37 mg/dL — ABNORMAL HIGH (ref 0.61–1.24)
GFR, Estimated: 51 mL/min — ABNORMAL LOW (ref >60)
Glucose, Bld: 93 mg/dL (ref 70–99)
Potassium: 3.6 mmol/L (ref 3.5–5.1)
Sodium: 143 mmol/L (ref 135–145)

## 2023-07-14 LAB — CBC WITH DIFFERENTIAL/PLATELET
Abs Immature Granulocytes: 0.03 10*3/uL (ref 0.00–0.07)
Basophils Absolute: 0 10*3/uL (ref 0.0–0.1)
Basophils Relative: 0 %
Eosinophils Absolute: 0 10*3/uL (ref 0.0–0.5)
Eosinophils Relative: 1 %
HCT: 42 % (ref 39.0–52.0)
Hemoglobin: 13.4 g/dL (ref 13.0–17.0)
Immature Granulocytes: 1 %
Lymphocytes Relative: 20 %
Lymphs Abs: 1.2 10*3/uL (ref 0.7–4.0)
MCH: 30.1 pg (ref 26.0–34.0)
MCHC: 31.9 g/dL (ref 30.0–36.0)
MCV: 94.4 fL (ref 80.0–100.0)
Monocytes Absolute: 1.1 10*3/uL — ABNORMAL HIGH (ref 0.1–1.0)
Monocytes Relative: 17 %
Neutro Abs: 3.9 10*3/uL (ref 1.7–7.7)
Neutrophils Relative %: 61 %
Platelets: 181 10*3/uL (ref 150–400)
RBC: 4.45 MIL/uL (ref 4.22–5.81)
RDW: 14.7 % (ref 11.5–15.5)
WBC: 6.3 10*3/uL (ref 4.0–10.5)
nRBC: 0 % (ref 0.0–0.2)

## 2023-07-14 MED ORDER — AMIODARONE HCL 100 MG PO TABS: 100.0000 mg | ORAL_TABLET | Freq: Every day | ORAL | Status: DC

## 2023-07-14 MED ORDER — DOXYCYCLINE HYCLATE 100 MG PO TABS: 100.0000 mg | ORAL_TABLET | Freq: Two times a day (BID) | ORAL | 0 refills | Status: DC

## 2023-07-14 MED ORDER — AZITHROMYCIN 250 MG PO TABS: ORAL_TABLET | ORAL | 0 refills | Status: DC

## 2023-07-14 NOTE — Progress Notes (Addendum)
Subjective:    Patient ID: Gerald Hall, male    DOB: [birth year], 88 y.o.   MRN: [id number]  HPI  Patient recently had onset of not feeling good head congestion drainage coughing tested positive for COVID I spoke with the patient by phone over the weekend he was instructed if he does not get better to follow-up Should be noted that because of his medications Paxil bid was not an option.  Also should be noted molnupiravir does not have significant improvement regarding prevention of hospitalization plus also it would be $300 out-of-pocket patient opted not to be on the medicine Follows up today because he is feeling bad able to eat some drink some urinating some no vomiting.  Denies shortness of breath.  O2 saturations at home have been good    Review of Systems     Objective:   Physical Exam  Gen-NAD not toxic TMS-normal bilateral T- normal no redness Chest-CTA respiratory rate normal some crackles in the base crackles CV RRR no murmur Skin-warm dry Neuro-grossly normal       Assessment & Plan:  1. COVID-19 virus infection Paxlovid is not an option Should his symptoms get worse recommend follow-up to ER Follow-up here if not improving over next several days Encourage fluid intake solid intake Lab work did not show any dehydration white blood count within normal range Chest x-ray did not show at obvious pneumonia but did show COVID involvement although not severe - CBC with Differential - Basic Metabolic Panel - DG Chest 2 View; Future  2. Community acquired pneumonia, unspecified laterality Based on the crackles in the base I am concerned about the possibility of early clinical pneumonia we will cover with doxycycline twice daily patient does not tolerate Augmentin or cephalosporins plus also azithromycin is contraindicated should he have progressive symptoms he is to go to the ER  We did discuss staying away from his SGLT2 through Monday

## 2023-07-15 ENCOUNTER — Telehealth: Payer: Self-pay | Admitting: Family Medicine

## 2023-07-15 NOTE — Telephone Encounter (Signed)
Checked with [employer] and spoke with [employer], she states he has not filled Trazodone since 06/13/23 which was also D/C'd on that same day. Spoke with the patient and he states he is doing better, not hurting as much, still coughing some, breathing okay. And taking prescription medication. He was advised per drs notes, pt verbalized understanding.

## 2023-07-15 NOTE — Telephone Encounter (Signed)
Nurses  Recently I received documentation stating that the patient is on trazodone as well as amiodarone  Trazodone can cause a interaction with amiodarone which can trigger severe arrhythmia issues Trazodone is not on the EMR med list but that does not mean he is not getting it  Therefore please do 2 things #1 touch base with [facility] pharmacy-please have them check to see is he getting trazodone prescribed to him?  If he is please cancel trazodone due to this potential interaction.  If he is not getting trazodone from [facility] pharmacy please let me know  #2 if patient getting trazodone then please inform patient not to be on trazodone with amiodarone in other words stop trazodone  (Please note patient was seen yesterday for COVID feel free to see how he is doing today more than likely will be slow improvement but if he gets dramatically worse and may need to go to the ER-he was also told to give [place] an update on Monday how he is doing

## 2023-07-21 DIAGNOSIS — M4802 Spinal stenosis, cervical region: Secondary | ICD-10-CM | POA: Diagnosis not present

## 2023-07-21 DIAGNOSIS — J432 Centrilobular emphysema: Secondary | ICD-10-CM | POA: Diagnosis not present

## 2023-07-21 DIAGNOSIS — J9611 Chronic respiratory failure with hypoxia: Secondary | ICD-10-CM | POA: Diagnosis not present

## 2023-07-21 DIAGNOSIS — Z7951 Long term (current) use of inhaled steroids: Secondary | ICD-10-CM | POA: Diagnosis not present

## 2023-07-21 DIAGNOSIS — M6281 Muscle weakness (generalized): Secondary | ICD-10-CM | POA: Diagnosis not present

## 2023-07-21 DIAGNOSIS — E876 Hypokalemia: Secondary | ICD-10-CM | POA: Diagnosis not present

## 2023-07-21 DIAGNOSIS — Z96642 Presence of left artificial hip joint: Secondary | ICD-10-CM | POA: Diagnosis not present

## 2023-07-21 DIAGNOSIS — F17210 Nicotine dependence, cigarettes, uncomplicated: Secondary | ICD-10-CM | POA: Diagnosis not present

## 2023-07-21 DIAGNOSIS — Z9981 Dependence on supplemental oxygen: Secondary | ICD-10-CM | POA: Diagnosis not present

## 2023-07-21 DIAGNOSIS — Z7984 Long term (current) use of oral hypoglycemic drugs: Secondary | ICD-10-CM | POA: Diagnosis not present

## 2023-07-21 DIAGNOSIS — S72002D Fracture of unspecified part of neck of left femur, subsequent encounter for closed fracture with routine healing: Secondary | ICD-10-CM | POA: Diagnosis not present

## 2023-07-21 DIAGNOSIS — M159 Polyosteoarthritis, unspecified: Secondary | ICD-10-CM | POA: Diagnosis not present

## 2023-07-26 DIAGNOSIS — C4441 Basal cell carcinoma of skin of scalp and neck: Secondary | ICD-10-CM | POA: Diagnosis not present

## 2023-07-27 ENCOUNTER — Telehealth: Payer: Self-pay | Admitting: Family Medicine

## 2023-07-27 NOTE — Telephone Encounter (Signed)
Patient is requesting new prescription for trazodone  be sent in . Patient hasn't been on medication since 2018. He has appointment on 10/7. [facility]

## 2023-07-28 ENCOUNTER — Telehealth: Payer: Self-pay | Admitting: Family Medicine

## 2023-07-28 ENCOUNTER — Telehealth: Payer: Self-pay

## 2023-07-28 DIAGNOSIS — M4802 Spinal stenosis, cervical region: Secondary | ICD-10-CM | POA: Diagnosis not present

## 2023-07-28 DIAGNOSIS — Z9981 Dependence on supplemental oxygen: Secondary | ICD-10-CM | POA: Diagnosis not present

## 2023-07-28 DIAGNOSIS — J432 Centrilobular emphysema: Secondary | ICD-10-CM | POA: Diagnosis not present

## 2023-07-28 DIAGNOSIS — M159 Polyosteoarthritis, unspecified: Secondary | ICD-10-CM | POA: Diagnosis not present

## 2023-07-28 DIAGNOSIS — Z7984 Long term (current) use of oral hypoglycemic drugs: Secondary | ICD-10-CM | POA: Diagnosis not present

## 2023-07-28 DIAGNOSIS — Z96642 Presence of left artificial hip joint: Secondary | ICD-10-CM | POA: Diagnosis not present

## 2023-07-28 DIAGNOSIS — E876 Hypokalemia: Secondary | ICD-10-CM | POA: Diagnosis not present

## 2023-07-28 DIAGNOSIS — S72002D Fracture of unspecified part of neck of left femur, subsequent encounter for closed fracture with routine healing: Secondary | ICD-10-CM | POA: Diagnosis not present

## 2023-07-28 DIAGNOSIS — J9611 Chronic respiratory failure with hypoxia: Secondary | ICD-10-CM | POA: Diagnosis not present

## 2023-07-28 DIAGNOSIS — Z7951 Long term (current) use of inhaled steroids: Secondary | ICD-10-CM | POA: Diagnosis not present

## 2023-07-28 DIAGNOSIS — M6281 Muscle weakness (generalized): Secondary | ICD-10-CM | POA: Diagnosis not present

## 2023-07-28 DIAGNOSIS — F17210 Nicotine dependence, cigarettes, uncomplicated: Secondary | ICD-10-CM | POA: Diagnosis not present

## 2023-07-28 NOTE — Telephone Encounter (Signed)
Patient called and advised per Dr Lorin Picket Unfortunately trazodone interacts with amiodarone.  Therefore I would not recommend doing this. He has a follow-up visit October 7 we can discuss further at that time Also unfortunately there is not other safe medications that are necessary to utilize for sleep for his age group because of increased risk falls injuries etc.Gerald Hall Patient verbalized understanding.

## 2023-07-28 NOTE — Telephone Encounter (Signed)
Unfortunately trazodone interacts with amiodarone.  Therefore I would not recommend doing this. He has a follow-up visit October 7 we can discuss further at that time Also unfortunately there is not other safe medications that are necessary to utilize for sleep for his age group because of increased risk falls injuries etc.

## 2023-07-28 NOTE — Telephone Encounter (Signed)
FYI- Home Health ([name])called to let you know patient fell out of bed on Monday and Tuesday night no bruises or injuries just sore. [phone]

## 2023-07-29 ENCOUNTER — Other Ambulatory Visit: Payer: Self-pay | Admitting: Family Medicine

## 2023-07-29 NOTE — Telephone Encounter (Signed)
So noted thank you 

## 2023-07-29 NOTE — Telephone Encounter (Signed)
Patient notified of provider's recommendations and request the provider do his research before his visit and find him a safe option for sleep because he is not sleeping at all.

## 2023-07-30 ENCOUNTER — Encounter: Payer: Self-pay | Admitting: Family Medicine

## 2023-07-30 NOTE — Telephone Encounter (Signed)
MyChart message sent to patient in my opinion there are no sleep options other than melatonin

## 2023-08-01 ENCOUNTER — Other Ambulatory Visit: Payer: Self-pay | Admitting: Family Medicine

## 2023-08-01 ENCOUNTER — Ambulatory Visit: Payer: Medicare Other | Admitting: Family Medicine

## 2023-08-02 ENCOUNTER — Other Ambulatory Visit: Payer: Self-pay

## 2023-08-02 NOTE — Telephone Encounter (Signed)
Nurses-on previous review it should be noted that I recommended to stop trazodone because it interacts with amiodarone.  There are no suitable substitute.  With his age we do not recommend sleep medicine.  We can discuss any other options at follow-up office visit discontinue trazodone please make sure pharmacy understands this as for the aspirin if he is taking the aspirin he may utilize the 81 mg daily but we will discuss this further at his follow-up visit as well

## 2023-08-03 MED ORDER — ASPIRIN 81 MG PO TBEC: 81.0000 mg | DELAYED_RELEASE_TABLET | Freq: Two times a day (BID) | ORAL | 2 refills | Status: DC

## 2023-08-04 DIAGNOSIS — F17210 Nicotine dependence, cigarettes, uncomplicated: Secondary | ICD-10-CM | POA: Diagnosis not present

## 2023-08-04 DIAGNOSIS — J9611 Chronic respiratory failure with hypoxia: Secondary | ICD-10-CM | POA: Diagnosis not present

## 2023-08-04 DIAGNOSIS — M6281 Muscle weakness (generalized): Secondary | ICD-10-CM | POA: Diagnosis not present

## 2023-08-04 DIAGNOSIS — Z7951 Long term (current) use of inhaled steroids: Secondary | ICD-10-CM | POA: Diagnosis not present

## 2023-08-04 DIAGNOSIS — Z9981 Dependence on supplemental oxygen: Secondary | ICD-10-CM | POA: Diagnosis not present

## 2023-08-04 DIAGNOSIS — M159 Polyosteoarthritis, unspecified: Secondary | ICD-10-CM | POA: Diagnosis not present

## 2023-08-04 DIAGNOSIS — M4802 Spinal stenosis, cervical region: Secondary | ICD-10-CM | POA: Diagnosis not present

## 2023-08-04 DIAGNOSIS — E876 Hypokalemia: Secondary | ICD-10-CM | POA: Diagnosis not present

## 2023-08-04 DIAGNOSIS — Z96642 Presence of left artificial hip joint: Secondary | ICD-10-CM | POA: Diagnosis not present

## 2023-08-04 DIAGNOSIS — Z7984 Long term (current) use of oral hypoglycemic drugs: Secondary | ICD-10-CM | POA: Diagnosis not present

## 2023-08-04 DIAGNOSIS — S72002D Fracture of unspecified part of neck of left femur, subsequent encounter for closed fracture with routine healing: Secondary | ICD-10-CM | POA: Diagnosis not present

## 2023-08-04 DIAGNOSIS — J432 Centrilobular emphysema: Secondary | ICD-10-CM | POA: Diagnosis not present

## 2023-08-05 ENCOUNTER — Other Ambulatory Visit: Payer: Self-pay | Admitting: Family Medicine

## 2023-08-06 DIAGNOSIS — R2689 Other abnormalities of gait and mobility: Secondary | ICD-10-CM | POA: Diagnosis not present

## 2023-08-06 DIAGNOSIS — J9621 Acute and chronic respiratory failure with hypoxia: Secondary | ICD-10-CM | POA: Diagnosis not present

## 2023-08-08 ENCOUNTER — Telehealth: Payer: Self-pay

## 2023-08-08 ENCOUNTER — Other Ambulatory Visit: Payer: Self-pay | Admitting: Family Medicine

## 2023-08-08 NOTE — Telephone Encounter (Signed)
[facility] called about Gerald Hall 2.5 it shows it was discontinued 11/2022 he was requesting refill   Gerald Hall [phone]

## 2023-08-08 NOTE — Telephone Encounter (Signed)
Nurses-verify with the patient that he is taking Norvasc 2.5 mg daily if so he may have 30-day with 6 refills thank you

## 2023-08-09 ENCOUNTER — Ambulatory Visit: Payer: Medicare Other | Admitting: Family Medicine

## 2023-08-09 NOTE — Telephone Encounter (Signed)
Attempted to call pt but could not get through because line was busy, called daughter Maralyn Sago) unfortunatly she was unsure if pt is still taking Norvasc and informed me it is his birthday and his phone will more than likely be busy most of the day, will attempt to call back later

## 2023-08-10 NOTE — Telephone Encounter (Signed)
Called Pt to ask if still taking norvasc 2.5, No answer left message for callback

## 2023-08-12 DIAGNOSIS — E876 Hypokalemia: Secondary | ICD-10-CM | POA: Diagnosis not present

## 2023-08-12 DIAGNOSIS — S72002D Fracture of unspecified part of neck of left femur, subsequent encounter for closed fracture with routine healing: Secondary | ICD-10-CM | POA: Diagnosis not present

## 2023-08-12 DIAGNOSIS — Z9181 History of falling: Secondary | ICD-10-CM | POA: Diagnosis not present

## 2023-08-12 DIAGNOSIS — J9611 Chronic respiratory failure with hypoxia: Secondary | ICD-10-CM | POA: Diagnosis not present

## 2023-08-12 DIAGNOSIS — Z96642 Presence of left artificial hip joint: Secondary | ICD-10-CM | POA: Diagnosis not present

## 2023-08-12 DIAGNOSIS — M159 Polyosteoarthritis, unspecified: Secondary | ICD-10-CM | POA: Diagnosis not present

## 2023-08-12 DIAGNOSIS — F17210 Nicotine dependence, cigarettes, uncomplicated: Secondary | ICD-10-CM | POA: Diagnosis not present

## 2023-08-12 DIAGNOSIS — Z7951 Long term (current) use of inhaled steroids: Secondary | ICD-10-CM | POA: Diagnosis not present

## 2023-08-12 DIAGNOSIS — Z9981 Dependence on supplemental oxygen: Secondary | ICD-10-CM | POA: Diagnosis not present

## 2023-08-12 DIAGNOSIS — J432 Centrilobular emphysema: Secondary | ICD-10-CM | POA: Diagnosis not present

## 2023-08-12 DIAGNOSIS — M4802 Spinal stenosis, cervical region: Secondary | ICD-10-CM | POA: Diagnosis not present

## 2023-08-12 DIAGNOSIS — Z7984 Long term (current) use of oral hypoglycemic drugs: Secondary | ICD-10-CM | POA: Diagnosis not present

## 2023-08-18 DIAGNOSIS — E876 Hypokalemia: Secondary | ICD-10-CM | POA: Diagnosis not present

## 2023-08-18 DIAGNOSIS — Z96642 Presence of left artificial hip joint: Secondary | ICD-10-CM | POA: Diagnosis not present

## 2023-08-18 DIAGNOSIS — J432 Centrilobular emphysema: Secondary | ICD-10-CM | POA: Diagnosis not present

## 2023-08-18 DIAGNOSIS — M4802 Spinal stenosis, cervical region: Secondary | ICD-10-CM | POA: Diagnosis not present

## 2023-08-18 DIAGNOSIS — J9611 Chronic respiratory failure with hypoxia: Secondary | ICD-10-CM | POA: Diagnosis not present

## 2023-08-18 DIAGNOSIS — M159 Polyosteoarthritis, unspecified: Secondary | ICD-10-CM | POA: Diagnosis not present

## 2023-08-18 DIAGNOSIS — F17210 Nicotine dependence, cigarettes, uncomplicated: Secondary | ICD-10-CM | POA: Diagnosis not present

## 2023-08-18 DIAGNOSIS — Z7951 Long term (current) use of inhaled steroids: Secondary | ICD-10-CM | POA: Diagnosis not present

## 2023-08-18 DIAGNOSIS — Z7984 Long term (current) use of oral hypoglycemic drugs: Secondary | ICD-10-CM | POA: Diagnosis not present

## 2023-08-18 DIAGNOSIS — Z9981 Dependence on supplemental oxygen: Secondary | ICD-10-CM | POA: Diagnosis not present

## 2023-08-18 DIAGNOSIS — S72002D Fracture of unspecified part of neck of left femur, subsequent encounter for closed fracture with routine healing: Secondary | ICD-10-CM | POA: Diagnosis not present

## 2023-08-18 DIAGNOSIS — Z9181 History of falling: Secondary | ICD-10-CM | POA: Diagnosis not present

## 2023-08-19 NOTE — Telephone Encounter (Signed)
Patient states this is a error and to disregard

## 2023-08-24 DIAGNOSIS — J432 Centrilobular emphysema: Secondary | ICD-10-CM | POA: Diagnosis not present

## 2023-08-24 DIAGNOSIS — Z7984 Long term (current) use of oral hypoglycemic drugs: Secondary | ICD-10-CM | POA: Diagnosis not present

## 2023-08-24 DIAGNOSIS — E876 Hypokalemia: Secondary | ICD-10-CM | POA: Diagnosis not present

## 2023-08-24 DIAGNOSIS — M4802 Spinal stenosis, cervical region: Secondary | ICD-10-CM | POA: Diagnosis not present

## 2023-08-24 DIAGNOSIS — S72002D Fracture of unspecified part of neck of left femur, subsequent encounter for closed fracture with routine healing: Secondary | ICD-10-CM | POA: Diagnosis not present

## 2023-08-24 DIAGNOSIS — M159 Polyosteoarthritis, unspecified: Secondary | ICD-10-CM | POA: Diagnosis not present

## 2023-08-24 DIAGNOSIS — Z9981 Dependence on supplemental oxygen: Secondary | ICD-10-CM | POA: Diagnosis not present

## 2023-08-24 DIAGNOSIS — Z96642 Presence of left artificial hip joint: Secondary | ICD-10-CM | POA: Diagnosis not present

## 2023-08-24 DIAGNOSIS — Z7951 Long term (current) use of inhaled steroids: Secondary | ICD-10-CM | POA: Diagnosis not present

## 2023-08-24 DIAGNOSIS — J9611 Chronic respiratory failure with hypoxia: Secondary | ICD-10-CM | POA: Diagnosis not present

## 2023-08-24 DIAGNOSIS — Z9181 History of falling: Secondary | ICD-10-CM | POA: Diagnosis not present

## 2023-08-24 DIAGNOSIS — F17210 Nicotine dependence, cigarettes, uncomplicated: Secondary | ICD-10-CM | POA: Diagnosis not present

## 2023-08-26 ENCOUNTER — Other Ambulatory Visit: Payer: Self-pay | Admitting: Family Medicine

## 2023-08-26 ENCOUNTER — Other Ambulatory Visit: Payer: Self-pay | Admitting: "Endocrinology

## 2023-08-26 DIAGNOSIS — E0590 Thyrotoxicosis, unspecified without thyrotoxic crisis or storm: Secondary | ICD-10-CM

## 2023-08-30 ENCOUNTER — Emergency Department (HOSPITAL_COMMUNITY): Payer: Medicare Other

## 2023-08-30 ENCOUNTER — Other Ambulatory Visit: Payer: Self-pay

## 2023-08-30 ENCOUNTER — Emergency Department (HOSPITAL_COMMUNITY)
Admission: EM | Admit: 2023-08-30 | Discharge: 2023-08-30 | Disposition: A | Discharge status: Home / Self Care | Payer: Medicare Other | Attending: Emergency Medicine | Admitting: Emergency Medicine

## 2023-08-30 DIAGNOSIS — W19XXXA Unspecified fall, initial encounter: Secondary | ICD-10-CM | POA: Insufficient documentation

## 2023-08-30 DIAGNOSIS — S2231XA Fracture of one rib, right side, initial encounter for closed fracture: Secondary | ICD-10-CM | POA: Insufficient documentation

## 2023-08-30 DIAGNOSIS — R0781 Pleurodynia: Secondary | ICD-10-CM | POA: Diagnosis present

## 2023-08-30 DIAGNOSIS — Z7982 Long term (current) use of aspirin: Secondary | ICD-10-CM | POA: Diagnosis not present

## 2023-08-30 DIAGNOSIS — R0902 Hypoxemia: Secondary | ICD-10-CM | POA: Diagnosis not present

## 2023-08-30 MED ORDER — TRAMADOL HCL 50 MG PO TABS
50.0000 mg | ORAL_TABLET | Freq: Once | ORAL | Status: AC
  Administered 2023-08-30: 50 mg via ORAL
  Filled 2023-08-30: qty 1

## 2023-08-30 NOTE — ED Triage Notes (Signed)
Pt bib [facility] from home pt states he had a mechanical fall yesterday afternoon. Denies hitting head, no blood thinners. C/o right rib pain.

## 2023-08-30 NOTE — ED Provider Notes (Signed)
[place] EMERGENCY DEPARTMENT AT [facility]  Provider Note  CSN: [id number] Arrival date & time: 08/30/23 0136  History Chief Complaint  Patient presents with   Gerald Hall is a 88 y.o. male brought to the ED via EMS from home. They report he had a mechanical fall earlier in the day 11/4 with R rib injury. Did not initially call EMS but was having trouble sleeping due to R chest wall pain. He denies head injury or LOC, not on blood thinner.    Home Medications Prior to Admission medications   Medication Sig Start Date End Date Taking? Authorizing Provider  acetaminophen (TYLENOL) 500 MG tablet Take 1,000 mg by mouth every 6 (six) hours as needed for mild pain or headache.    [provider]  albuterol (VENTOLIN HFA) 108 (90 Base) MCG/ACT inhaler Inhale 2 puffs into the lungs every 4 (four) hours as needed for wheezing. 09/01/22   Babs Sciara, MD  amiodarone (PACERONE) 100 MG tablet Take 1 tablet (100 mg total) by mouth daily. 07/14/23   Babs Sciara, MD  aspirin EC 81 MG tablet Take 1 tablet (81 mg total) by mouth 2 (two) times daily. Swallow whole. 08/03/23   Babs Sciara, MD  budesonide-formoterol (SYMBICORT) 160-4.5 MCG/ACT inhaler Inhale 2 puffs into the lungs 2 (two) times daily. 08/27/22   Babs Sciara, MD  calcium carbonate (OS-CAL) 600 MG TABS tablet Take 600 mg by mouth daily with breakfast.    [provider]  carvedilol (COREG) 3.125 MG tablet TAKE ONE TABLET BY MOUTH TWICE DAILY 08/26/23   Babs Sciara, MD  cholecalciferol (VITAMIN D) 400 units TABS tablet Take 1,000 Units by mouth daily.    [provider]  diclofenac Sodium (VOLTAREN) 1 % GEL Apply 2 g topically. 04/05/23 04/04/24  [provider]  doxycycline (VIBRA-TABS) 100 MG tablet Take 1 tablet (100 mg total) by mouth 2 (two) times daily. 07/14/23   Babs Sciara, MD  feeding supplement, GLUCERNA SHAKE, (GLUCERNA SHAKE) LIQD Take 237 mLs by mouth 3  (three) times daily between meals. 02/18/23   Narda Bonds, MD  finasteride (PROSCAR) 5 MG tablet TAKE ONE TABLET BY MOUTH EVERY DAY 08/26/23   Babs Sciara, MD  fluticasone (FLONASE) 50 MCG/ACT nasal spray Place 2 sprays into both nostrils at bedtime. 08/27/22   Babs Sciara, MD  GNP VITAMIN C 500 MG tablet TAKE ONE TABLET BY MOUTH EVERY MORNING 08/26/23   Luking, Jonna Coup, MD  GNP VITAMIN D3 EXTRA STRENGTH 25 MCG (1000 UT) tablet TAKE ONE TABLET BY MOUTH EVERY MORNING 08/01/23   Luking, Scott A, MD  ipratropium-albuterol (DUONEB) 0.5-2.5 (3) MG/3ML SOLN Take 3 mLs by nebulization every 4 (four) hours as needed. 08/27/22   Luking, Jonna Coup, MD  JARDIANCE 10 MG TABS tablet TAKE ONE TABLET BY MOUTH ONCE DAILY BEFORE BREAKFAST Patient taking differently: Take 10 mg by mouth daily. 11/17/22   Jonelle Sidle, MD  methimazole (TAPAZOLE) 5 MG tablet TAKE ONE TABLET BY MOUTH EVERY DAY 12/21/22   Roma Kayser, MD  OXYGEN Inhale 2 L into the lungs continuous.    [provider]  oxymetazoline (AFRIN) 0.05 % nasal spray Place 1 spray into both nostrils at bedtime.    [provider]  polyethylene glycol (MIRALAX / GLYCOLAX) 17 g packet Take 17 g by mouth daily as needed for mild constipation.    [provider]  polyvinyl alcohol (LIQUIFILM TEARS) 1.4 % ophthalmic solution Place 1 drop into both eyes as needed for dry eyes.    [provider]  potassium chloride (KLOR-CON) 10 MEQ tablet TAKE TWO TABLETS BY MOUTH EVERY DAY 07/06/23   Babs Sciara, MD  rOPINIRole (REQUIP) 2 MG tablet Take 1 tablet (2 mg total) by mouth 2 (two) times daily. Patient taking differently: Take 2 mg by mouth at bedtime. 1 tablet in the afternoon as needed  and 1 tablet at bedtime 01/31/23 03/02/23  Sherryll Burger, Pratik D, DO  tamsulosin (FLOMAX) 0.4 MG CAPS capsule TAKE (1) CAPSULE BY MOUTH TWICE DAILY. 06/02/23   Babs Sciara, MD  torsemide (DEMADEX) 10 MG tablet Take 1 tablet (10 mg total) by  mouth every other day. 02/06/23 03/08/23  Arrien, York Ram, MD  traMADol Janean Sark) 50 MG tablet 1 taken 3 times daily as needed pain 05/30/23   Babs Sciara, MD  Zinc Acetate, Oral, (ZINC ACETATE PO) Take 1 tablet by mouth daily.    [provider]  ZINC PLUS VITAMIN C 50-90 MG CAPS TAKE ONE TABLET BY MOUTH EVERY MORNING 08/26/23   Babs Sciara, MD     Allergies    Cephalexin and Penicillins   Review of Systems   Review of Systems Please see HPI for pertinent positives and negatives  Physical Exam BP 119/85   Pulse 86   Temp (!) 97.1 F (36.2 C) (Oral)   Resp 20   Ht 5\' 9"  (1.753 m)   Wt 86.2 kg   SpO2 97%   BMI 28.06 kg/m   Physical Exam Vitals and nursing note reviewed.  Constitutional:      Appearance: Normal appearance.  HENT:     Head: Normocephalic and atraumatic.     Nose: Nose normal.     Mouth/Throat:     Mouth: Mucous membranes are moist.  Eyes:     Extraocular Movements: Extraocular movements intact.     Conjunctiva/sclera: Conjunctivae normal.  Cardiovascular:     Rate and Rhythm: Normal rate.  Pulmonary:     Effort: Pulmonary effort is normal.     Breath sounds: Normal breath sounds.  Chest:     Chest wall: Tenderness (R lateral ribs) present.  Abdominal:     General: Abdomen is flat.     Palpations: Abdomen is soft.     Tenderness: There is no abdominal tenderness.  Musculoskeletal:        General: No swelling. Normal range of motion.     Cervical back: Neck supple.  Skin:    General: Skin is warm and dry.  Neurological:     General: No focal deficit present.     Mental Status: He is alert.  Psychiatric:        Mood and Affect: Mood normal.     ED Results / Procedures / Treatments   EKG None  Procedures Procedures  Medications Ordered in the ED Medications  traMADol (ULTRAM) tablet 50 mg (50 mg Oral Given 08/30/23 0326)    Initial Impression and Plan  Patient here with mechanical fall several hours ago with  persistent R rib pain. No head injury. Will check xray. He takes Tramadol at home regularly.   ED Course   Clinical Course as of 08/30/23 0328  Tue Aug 30, 2023  0233 I personally viewed the images from radiology studies and agree with radiologist interpretation: Xray shows a nondisplaced rib fracture. Patient is sleeping soundly on re-evaluation,no hypoxia or respiratory  distress. He takes Tramadol regularly at home, will give a dose tonight for pain control. He was offered admission for pain control, but states he wants to go home in AM when his caretaker can come get him. Will continue to monitor in the ED in the meantime.  [CS]    Clinical Course User Index [CS] Pollyann Savoy, MD     MDM Rules/Calculators/A&P Medical Decision Making Problems Addressed: Closed fracture of one rib of right side, initial encounter: acute illness or injury Fall, initial encounter: acute illness or injury  Amount and/or Complexity of Data Reviewed Radiology: ordered and independent interpretation performed. Decision-making details documented in ED Course.  Risk Prescription drug management.     Final Clinical Impression(s) / ED Diagnoses Final diagnoses:  Fall, initial encounter  Closed fracture of one rib of right side, initial encounter    Rx / DC Orders ED Discharge Orders     None        Pollyann Savoy, MD 08/30/23 [phone]

## 2023-08-30 NOTE — Discharge Instructions (Signed)
Please continue taking your Tramadol for pain. Follow up with Dr. Gerda Diss or return to the ER if your pain is not well controlled at home.

## 2023-08-31 ENCOUNTER — Other Ambulatory Visit: Payer: Self-pay | Admitting: Family Medicine

## 2023-08-31 ENCOUNTER — Other Ambulatory Visit: Payer: Self-pay | Admitting: "Endocrinology

## 2023-08-31 ENCOUNTER — Telehealth: Payer: Self-pay

## 2023-08-31 DIAGNOSIS — E0590 Thyrotoxicosis, unspecified without thyrotoxic crisis or storm: Secondary | ICD-10-CM

## 2023-08-31 NOTE — Transitions of Care (Post Inpatient/ED Visit) (Signed)
08/31/2023  Name: Gerald Hall MRN: [id number] DOB: [birth year]  Today's TOC FU Call Status: Today's TOC FU Call Status:: Successful TOC FU Call Completed TOC FU Call Complete Date: 08/31/23 Patient's Name and Date of Birth confirmed.  Transition Care Management Follow-up Telephone Call Date of Discharge: 08/30/23 [facility] Type of Discharge: Emergency Department Reason for ED Visit: Other: (fall) How have you been since you were released from the hospital?: Better Any questions or concerns?: No  Items Reviewed: Did you receive and understand the discharge instructions provided?: Yes Medications obtained,verified, and reconciled?: Yes (Medications Reviewed) Any new allergies since your discharge?: No Dietary orders reviewed?: Yes Do you have support at home?: Yes People in Home: child(ren), adult  Medications Reviewed Today: Medications Reviewed Today     Reviewed by Karena Addison, LPN (Licensed Practical Nurse) on 08/31/23 at 1523  Med List Status: <None>   Medication Order Taking? Sig Documenting Provider Last Dose Status Informant  acetaminophen (TYLENOL) 500 MG tablet [id number] No Take 1,000 mg by mouth every 6 (six) hours as needed for mild pain or headache. [provider] Taking Active Nursing Home Medication Administration Guide (MAG)  albuterol (VENTOLIN HFA) 108 (90 Base) MCG/ACT inhaler [id number] No Inhale 2 puffs into the lungs every 4 (four) hours as needed for wheezing. Babs Sciara, MD Taking Active Nursing Home Medication Administration Guide (MAG)  amiodarone (PACERONE) 100 MG tablet [id number]  Take 1 tablet (100 mg total) by mouth daily. Babs Sciara, MD  Active   aspirin EC 81 MG tablet [id number]  Take 1 tablet (81 mg total) by mouth 2 (two) times daily. Swallow whole. Babs Sciara, MD  Active   budesonide-formoterol [facility]) 160-4.5 MCG/ACT inhaler [id number] No Inhale 2 puffs into the lungs 2 (two) times  daily. Babs Sciara, MD Taking Active Nursing Home Medication Administration Guide (MAG)  calcium carbonate (OS-CAL) 600 MG TABS tablet [id number] No Take 600 mg by mouth daily with breakfast. [provider] Taking Active Nursing Home Medication Administration Guide (MAG)  carvedilol (COREG) 3.125 MG tablet [id number]  TAKE ONE TABLET BY MOUTH TWICE DAILY Luking, Scott A, MD  Active   cholecalciferol (VITAMIN D) 400 units TABS tablet [id number] No Take 1,000 Units by mouth daily. [provider] Taking Active Nursing Home Medication Administration Guide (MAG)  diclofenac Sodium (VOLTAREN) 1 % GEL [id number] No Apply 2 g topically. [provider] Taking Active   doxycycline (VIBRA-TABS) 100 MG tablet [id number]  Take 1 tablet (100 mg total) by mouth 2 (two) times daily. Babs Sciara, MD  Active   feeding supplement, GLUCERNA SHAKE, (GLUCERNA SHAKE) LIQD [id number] No Take 237 mLs by mouth 3 (three) times daily between meals. Narda Bonds, MD Taking Active   finasteride (PROSCAR) 5 MG tablet [id number]  TAKE ONE TABLET BY MOUTH EVERY DAY Babs Sciara, MD  Active   fluticasone (FLONASE) 50 MCG/ACT nasal spray [id number] No Place 2 sprays into both nostrils at bedtime. Babs Sciara, MD Taking Active Nursing Home Medication Administration Guide (MAG)  GNP VITAMIN C 500 MG tablet [id number]  TAKE ONE TABLET BY MOUTH EVERY MORNING Luking, Jonna Coup, MD  Active   [facility] VITAMIN D3 EXTRA STRENGTH 25 MCG (1000 UT) tablet [id number]  TAKE ONE TABLET BY MOUTH EVERY MORNING Luking, Scott A, MD  Active   ipratropium-albuterol (DUONEB) 0.5-2.5 (3) MG/3ML SOLN [id number] No Take 3 mLs by nebulization every 4 (four) hours as needed. Babs Sciara, MD  Taking Active Nursing Home Medication Administration Guide (MAG)  JARDIANCE 10 MG TABS tablet [id number] No TAKE ONE TABLET BY MOUTH ONCE DAILY BEFORE BREAKFAST  Patient taking differently: Take 10 mg by mouth daily.   Jonelle Sidle, MD  Taking Active Nursing Home Medication Administration Guide (MAG)  methimazole (TAPAZOLE) 5 MG tablet [id number]  TAKE ONE TABLET BY MOUTH EVERY DAY Babs Sciara, MD  Active   OXYGEN [id number] No Inhale 2 L into the lungs continuous. [provider] Taking Active Nursing Home Medication Administration Guide (MAG)  oxymetazoline (AFRIN) 0.05 % nasal spray [id number] No Place 1 spray into both nostrils at bedtime. [provider] Taking Active Nursing Home Medication Administration Guide (MAG)  polyethylene glycol (MIRALAX / GLYCOLAX) 17 g packet [id number] No Take 17 g by mouth daily as needed for mild constipation. [provider] Taking Active Nursing Home Medication Administration Guide (MAG)  polyvinyl alcohol (LIQUIFILM TEARS) 1.4 % ophthalmic solution [id number] No Place 1 drop into both eyes as needed for dry eyes. [provider] Taking Active Nursing Home Medication Administration Guide (MAG)  potassium chloride (KLOR-CON) 10 MEQ tablet [id number]  TAKE TWO TABLETS BY MOUTH EVERY DAY Babs Sciara, MD  Active   rOPINIRole (REQUIP) 2 MG tablet [id number]  TAKE ONE TABLET BY MOUTH TWICE DAILY IN THE AFTERNOON AND AT BEDTIME Luking, Scott A, MD  Active   tamsulosin (FLOMAX) 0.4 MG CAPS capsule [id number] No TAKE (1) CAPSULE BY MOUTH TWICE DAILY. Babs Sciara, MD Taking Active   torsemide (DEMADEX) 10 MG tablet [id number] No Take 1 tablet (10 mg total) by mouth every other day. Arrien, York Ram, MD Taking Expired 03/08/23 2359 Nursing Home Medication Administration Guide (MAG)  traMADol (ULTRAM) 50 MG tablet [id number] No 1 taken 3 times daily as needed pain Luking, Jonna Coup, MD Taking Active   Zinc Acetate, Oral, (ZINC ACETATE PO) [id number] No Take 1 tablet by mouth daily. [provider] Taking Active Nursing Home Medication Administration Guide (MAG)  ZINC PLUS VITAMIN C 50-90 MG CAPS [id number]  TAKE ONE TABLET BY MOUTH EVERY MORNING Babs Sciara, MD  Active   Med List Note [place], [place] 76/28/31 5176): Pt             Home Care and Equipment/Supplies: Were Home Health Services Ordered?: NA Any new equipment or medical supplies ordered?: NA  Functional Questionnaire: Do you need assistance with bathing/showering or dressing?: Yes Do you need assistance with meal preparation?: Yes Do you need assistance with eating?: No Do you have difficulty maintaining continence: Yes Do you need assistance with getting out of bed/getting out of a chair/moving?: Yes Do you have difficulty managing or taking your medications?: Yes  Follow up appointments reviewed: PCP Follow-up appointment confirmed?: Yes Date of PCP follow-up appointment?: 09/05/23 Follow-up Provider: [facility] Follow-up appointment confirmed?: NA Do you need transportation to your follow-up appointment?: No Do you understand care options if your condition(s) worsen?: Yes-patient verbalized understanding    SIGNATURE Karena Addison, LPN [facility] Nurse Health Advisor Direct Dial [phone]

## 2023-09-02 ENCOUNTER — Ambulatory Visit: Payer: Self-pay | Admitting: Family Medicine

## 2023-09-02 ENCOUNTER — Telehealth: Payer: Self-pay | Admitting: Family Medicine

## 2023-09-02 ENCOUNTER — Telehealth: Payer: Medicare Other | Admitting: Family Medicine

## 2023-09-02 NOTE — Telephone Encounter (Signed)
Patient missed his telehealth visit he stated he didn't get a call his daughter called in for him patient is having a lot of pain and needs some relief over the weekend

## 2023-09-02 NOTE — Telephone Encounter (Signed)
[name] from pt is calling in stating that patient had a fall and went to the er and his rib is broken on the lower left side patient had pt on nov 5th and pt will resume next week

## 2023-09-02 NOTE — Progress Notes (Signed)
Pt did not show for visit after multiple attempts. DWB

## 2023-09-02 NOTE — Telephone Encounter (Signed)
FYI fro home health PT that they are holding Pt till next week due to broken rib

## 2023-09-02 NOTE — Telephone Encounter (Signed)
  Chief Complaint: Fall and Pain to Right Side Symptoms: Wheezing and Dizziness Frequency: Acute Disposition: [x] ED /[] Urgent Care (no appt availability in office) / [] Appointment(In office/virtual)/ []  [place] [facility]/ [] Home Care/ [] Refused Recommended Disposition /[] [place] [facility]/ []  Follow-up with PCP Additional Notes: Patient is refusing ER Visit wants to speak with provider.     Reason for Disposition  Injury (or injuries) that need emergency care  Answer Assessment - Initial Assessment Questions 1. MECHANISM: "How did the fall happen?"     Lost balance with oxygen cord 2. DOMESTIC VIOLENCE AND ELDER ABUSE SCREENING: "Did you fall because someone pushed you or tried to hurt you?" If Yes, ask: "Are you safe now?"     No 3. ONSET: "When did the fall happen?" (e.g., minutes, hours, or days ago)     2 days ago 4. LOCATION: "What part of the body hit the ground?" (e.g., back, buttocks, head, hips, knees, hands, head, stomach)     Front, Fractured Right Rib 5. INJURY: "Did you hurt (injure) yourself when you fell?" If Yes, ask: "What did you injure? Tell me more about this?" (e.g., body area; type of injury; pain severity)"     Right Rib Fracture 6. PAIN: "Is there any pain?" If Yes, ask: "How bad is the pain?" (e.g., Scale 1-10; or mild,  moderate, severe)   - NONE (0): No pain   - MILD (1-3): Doesn't interfere with normal activities    - MODERATE (4-7): Interferes with normal activities or awakens from sleep    - SEVERE (8-10): Excruciating pain, unable to do any normal activities      9 7. SIZE: For cuts, bruises, or swelling, ask: "How large is it?" (e.g., inches or centimeters)      No 9. OTHER SYMPTOMS: "Do you have any other symptoms?" (e.g., dizziness, fever, weakness; new onset or worsening).      Weakness, Wheezing 10. CAUSE: "What do you think caused the fall (or falling)?" (e.g., tripped, dizzy spell)       Tripped over oxygen cord.  Protocols used:  Falls and [facility]

## 2023-09-02 NOTE — Telephone Encounter (Signed)
Gerald Hall R     Pt has telehealth appt at 3:30 today

## 2023-09-02 NOTE — Telephone Encounter (Signed)
  Chief Complaint: 5/10 pain to right rib area  Symptoms: 5/10 pain at rest 9/10 pain with movement Frequency: since 08/30/2023 after fall - fracture to rib went to ED Pertinent Negatives: Patient denies difficulty breathing Disposition: [] ED /[] Urgent Care (no appt availability in office) / [] Appointment(In office/virtual)/ [x]  [place] [facility]/ [] Home Care/ [] Refused Recommended Disposition /[] [place] [facility]/ []  Follow-up with PCP Additional Notes: pt fell on 08/30/2023 and fracture rib went to ED - no pain medication given- pt requesting medication Telehealth appointment today at 3:30 Reason for Disposition  [1] MODERATE pain (e.g., interferes with normal activities) AND [2] comes and goes (cramps) AND [3] present > 24 hours  (Exception: Pain with Vomiting or Diarrhea - see that Guideline.)  Answer Assessment - Initial Assessment Questions 1. LOCATION: "Where does it hurt?"      Rib area on right side 2. RADIATION: "Does the pain shoot anywhere else?" (e.g., chest, back)     no 3. ONSET: "When did the pain begin?" (e.g., minutes, hours or days ago)      08/30/2023 4. SUDDEN: "Gradual or sudden onset?"     Sudden from fall 5. PATTERN "Does the pain come and go, or is it constant?"    - If it comes and goes: "How long does it last?" "Do you have pain now?"     (Note: Comes and goes means the pain is intermittent. It goes away completely between bouts.)    - If constant: "Is it getting better, staying the same, or getting worse?"      (Note: Constant means the pain never goes away completely; most serious pain is constant and gets worse.)      Constant pain 6. SEVERITY: "How bad is the pain?"  (e.g., Scale 1-10; mild, moderate, or severe)    - MILD (1-3): Doesn't interfere with normal activities, abdomen soft and not tender to touch..     - MODERATE (4-7): Interferes with normal activities or awakens from sleep, abdomen tender to touch.     - SEVERE (8-10): Excruciating  pain, doubled over, unable to do any normal activities.       5/10 7. RECURRENT SYMPTOM: "Have you ever had this type of stomach pain before?" If Yes, ask: "When was the last time?" and "What happened that time?"      no 8. AGGRAVATING FACTORS: "Does anything seem to cause this pain?" (e.g., foods, stress, alcohol)     Movement makes it 9/10 9. CARDIAC SYMPTOMS: "Do you have any of the following symptoms: chest pain, difficulty breathing, sweating, nausea?"     no 10. OTHER SYMPTOMS: "Do you have any other symptoms?" (e.g., back pain, diarrhea, fever, urination pain, vomiting)       no 11. PREGNANCY: "Is there any chance you are pregnant?" "When was your last menstrual period?"       no  Protocols used: Abdominal Pain - Upper-A-AH

## 2023-09-02 NOTE — Telephone Encounter (Signed)
Reviewed

## 2023-09-03 ENCOUNTER — Other Ambulatory Visit: Payer: Self-pay | Admitting: Family Medicine

## 2023-09-03 MED ORDER — HYDROCODONE-ACETAMINOPHEN 5-325 MG PO TABS
1.0000 | ORAL_TABLET | ORAL | 0 refills | Status: DC | PRN
Start: 2023-09-03 — End: 2023-09-05

## 2023-09-03 NOTE — Telephone Encounter (Signed)
I did speak with patient by phone.  He states that he was having a fair amount of rib pain.  He felt that he was able to get by until we will see him on Monday.  He states he would like to have a stronger pain medication to utilize to help with some of the discomfort states tramadol not helping enough.  Prescription for hydrocodone was sent to [facility] 1 every 4-6 hours as needed for severe pain caution drowsiness not for frequent use.  Patient voices understanding.

## 2023-09-05 ENCOUNTER — Ambulatory Visit (HOSPITAL_COMMUNITY)
Admission: RE | Admit: 2023-09-05 | Discharge: 2023-09-05 | Disposition: A | Discharge status: Home / Self Care | Payer: Medicare Other | Source: Ambulatory Visit | Attending: Family Medicine | Admitting: Family Medicine

## 2023-09-05 ENCOUNTER — Ambulatory Visit: Payer: Medicare Other | Admitting: Family Medicine

## 2023-09-05 ENCOUNTER — Encounter: Payer: Self-pay | Admitting: Family Medicine

## 2023-09-05 VITALS — BP 113/74 | HR 88

## 2023-09-05 DIAGNOSIS — R918 Other nonspecific abnormal finding of lung field: Secondary | ICD-10-CM | POA: Diagnosis not present

## 2023-09-05 DIAGNOSIS — Z8781 Personal history of (healed) traumatic fracture: Secondary | ICD-10-CM | POA: Diagnosis not present

## 2023-09-05 DIAGNOSIS — J189 Pneumonia, unspecified organism: Secondary | ICD-10-CM | POA: Diagnosis present

## 2023-09-05 DIAGNOSIS — J90 Pleural effusion, not elsewhere classified: Secondary | ICD-10-CM | POA: Diagnosis not present

## 2023-09-05 DIAGNOSIS — S2231XA Fracture of one rib, right side, initial encounter for closed fracture: Secondary | ICD-10-CM | POA: Diagnosis not present

## 2023-09-05 MED ORDER — AMOXICILLIN-POT CLAVULANATE 875-125 MG PO TABS: 1.0000 | ORAL_TABLET | Freq: Two times a day (BID) | ORAL | 0 refills | Status: DC

## 2023-09-05 MED ORDER — TORSEMIDE 20 MG PO TABS: ORAL_TABLET | ORAL | 5 refills | Status: DC

## 2023-09-05 MED ORDER — HYDROCODONE-ACETAMINOPHEN 10-325 MG PO TABS
1.0000 | ORAL_TABLET | ORAL | 0 refills | Status: DC | PRN
Start: 2023-09-05 — End: 2023-09-05

## 2023-09-05 MED ORDER — DOXYCYCLINE HYCLATE 100 MG PO TABS: 100.0000 mg | ORAL_TABLET | Freq: Two times a day (BID) | ORAL | 0 refills | Status: DC

## 2023-09-05 MED ORDER — HYDROCODONE-ACETAMINOPHEN 10-325 MG PO TABS: 1.0000 | ORAL_TABLET | ORAL | 0 refills | Status: AC | PRN

## 2023-09-05 NOTE — Progress Notes (Signed)
   Subjective:    Patient ID: Gerald Hall, male    DOB: 03-18-1935, 88 y.o.   MRN: [id number]  Discussed the use of AI scribe software for clinical note transcription with the patient, who gave verbal consent to proceed.  History of Present Illness   The patient, with a history of lung illnesses, presents with complaints of rib pain due to a recent fracture. He reports that the pain is significant and has been partially managed with hydrocodone, but a higher dose is required for adequate relief. The patient denies experiencing drowsiness or feeling "loopy" with the increased dose. However, he reports constipation, which he attributes to the pain medication. He has been managing this with Miralax and Dulcolax, but bowel movements remain infrequent and hard.  The patient also reports difficulty with breathing, which is more noticeable when moving around. He denies coughing, likely due to the pain from the rib fracture. Despite these symptoms, his oxygen saturation remains in the 90s. He has been using an incentive spirometer at home, but admits to not taking deep breaths due to the pain.  The patient's other medical conditions were not discussed in detail during this consultation.         Review of Systems     Objective:    Physical Exam   CHEST: Crackles auscultated bilaterally, more pronounced in the bases.   Heart regular extremities trace edema       Assessment & Plan:  Assessment and Plan    Rib Fractures Severe pain requiring increased dose of hydrocodone. No significant drowsiness or confusion reported with increased dose. -Increase hydrocodone to 10mg  as needed for pain. -Encourage use of incentive spirometry at home to prevent pneumonia.  Constipation Likely secondary to hydrocodone use. Currently using Miralax and Dulcolax with limited success. -Continue Miralax and Dulcolax as needed. -Encourage increased water intake.  Possible Pneumonia Crackles heard on  auscultation, worse at lung bases. No fever reported. -Order chest x-ray. -Start Augmentin and Doxycycline. -Review medications at next visit in two weeks.  General Health Maintenance -Discussed plans for upcoming holiday and recommended avoiding long car trip due to current pain levels.      Suppressed ejection fraction along with possible pulmonary edema increase the dose of the diuretic temporarily Continue antibiotics to cover for the possibility of infection

## 2023-09-06 ENCOUNTER — Other Ambulatory Visit: Payer: Self-pay

## 2023-09-06 DIAGNOSIS — J811 Chronic pulmonary edema: Secondary | ICD-10-CM

## 2023-09-06 DIAGNOSIS — J9621 Acute and chronic respiratory failure with hypoxia: Secondary | ICD-10-CM | POA: Diagnosis not present

## 2023-09-06 DIAGNOSIS — R2689 Other abnormalities of gait and mobility: Secondary | ICD-10-CM | POA: Diagnosis not present

## 2023-09-12 ENCOUNTER — Other Ambulatory Visit: Payer: Self-pay | Admitting: Family Medicine

## 2023-09-13 DIAGNOSIS — M4802 Spinal stenosis, cervical region: Secondary | ICD-10-CM | POA: Diagnosis not present

## 2023-09-13 DIAGNOSIS — J9611 Chronic respiratory failure with hypoxia: Secondary | ICD-10-CM | POA: Diagnosis not present

## 2023-09-13 DIAGNOSIS — S72002D Fracture of unspecified part of neck of left femur, subsequent encounter for closed fracture with routine healing: Secondary | ICD-10-CM | POA: Diagnosis not present

## 2023-09-13 DIAGNOSIS — F17210 Nicotine dependence, cigarettes, uncomplicated: Secondary | ICD-10-CM | POA: Diagnosis not present

## 2023-09-13 DIAGNOSIS — Z96642 Presence of left artificial hip joint: Secondary | ICD-10-CM | POA: Diagnosis not present

## 2023-09-13 DIAGNOSIS — Z7951 Long term (current) use of inhaled steroids: Secondary | ICD-10-CM | POA: Diagnosis not present

## 2023-09-13 DIAGNOSIS — J432 Centrilobular emphysema: Secondary | ICD-10-CM | POA: Diagnosis not present

## 2023-09-13 DIAGNOSIS — Z9981 Dependence on supplemental oxygen: Secondary | ICD-10-CM | POA: Diagnosis not present

## 2023-09-13 DIAGNOSIS — Z7984 Long term (current) use of oral hypoglycemic drugs: Secondary | ICD-10-CM | POA: Diagnosis not present

## 2023-09-13 DIAGNOSIS — E876 Hypokalemia: Secondary | ICD-10-CM | POA: Diagnosis not present

## 2023-09-13 DIAGNOSIS — M159 Polyosteoarthritis, unspecified: Secondary | ICD-10-CM | POA: Diagnosis not present

## 2023-09-13 DIAGNOSIS — Z9181 History of falling: Secondary | ICD-10-CM | POA: Diagnosis not present

## 2023-09-19 ENCOUNTER — Ambulatory Visit: Payer: Medicare Other | Admitting: Family Medicine

## 2023-09-20 ENCOUNTER — Other Ambulatory Visit: Payer: Self-pay | Admitting: Cardiology

## 2023-09-20 ENCOUNTER — Other Ambulatory Visit: Payer: Self-pay

## 2023-09-20 ENCOUNTER — Encounter: Payer: Self-pay | Admitting: Family Medicine

## 2023-09-20 ENCOUNTER — Other Ambulatory Visit: Payer: Self-pay | Admitting: Family Medicine

## 2023-09-20 ENCOUNTER — Ambulatory Visit (INDEPENDENT_AMBULATORY_CARE_PROVIDER_SITE_OTHER): Payer: Medicare Other | Admitting: Family Medicine

## 2023-09-20 VITALS — BP 103/66 | HR 87 | Temp 97.0°F | Ht 69.0 in | Wt 191.0 lb

## 2023-09-20 DIAGNOSIS — I10 Essential (primary) hypertension: Secondary | ICD-10-CM | POA: Diagnosis not present

## 2023-09-20 DIAGNOSIS — Z7951 Long term (current) use of inhaled steroids: Secondary | ICD-10-CM | POA: Diagnosis not present

## 2023-09-20 DIAGNOSIS — Z7984 Long term (current) use of oral hypoglycemic drugs: Secondary | ICD-10-CM | POA: Diagnosis not present

## 2023-09-20 DIAGNOSIS — G4700 Insomnia, unspecified: Secondary | ICD-10-CM | POA: Diagnosis not present

## 2023-09-20 DIAGNOSIS — J432 Centrilobular emphysema: Secondary | ICD-10-CM | POA: Diagnosis not present

## 2023-09-20 DIAGNOSIS — E876 Hypokalemia: Secondary | ICD-10-CM | POA: Diagnosis not present

## 2023-09-20 DIAGNOSIS — F17210 Nicotine dependence, cigarettes, uncomplicated: Secondary | ICD-10-CM | POA: Diagnosis not present

## 2023-09-20 DIAGNOSIS — N1831 Chronic kidney disease, stage 3a: Secondary | ICD-10-CM

## 2023-09-20 DIAGNOSIS — E0590 Thyrotoxicosis, unspecified without thyrotoxic crisis or storm: Secondary | ICD-10-CM

## 2023-09-20 DIAGNOSIS — J9611 Chronic respiratory failure with hypoxia: Secondary | ICD-10-CM | POA: Diagnosis not present

## 2023-09-20 DIAGNOSIS — M4802 Spinal stenosis, cervical region: Secondary | ICD-10-CM | POA: Diagnosis not present

## 2023-09-20 DIAGNOSIS — Z9181 History of falling: Secondary | ICD-10-CM | POA: Diagnosis not present

## 2023-09-20 DIAGNOSIS — Z96642 Presence of left artificial hip joint: Secondary | ICD-10-CM | POA: Diagnosis not present

## 2023-09-20 DIAGNOSIS — S72002D Fracture of unspecified part of neck of left femur, subsequent encounter for closed fracture with routine healing: Secondary | ICD-10-CM | POA: Diagnosis not present

## 2023-09-20 DIAGNOSIS — M159 Polyosteoarthritis, unspecified: Secondary | ICD-10-CM | POA: Diagnosis not present

## 2023-09-20 DIAGNOSIS — Z9981 Dependence on supplemental oxygen: Secondary | ICD-10-CM | POA: Diagnosis not present

## 2023-09-20 MED ORDER — TRIAMCINOLONE ACETONIDE 0.1 % EX CREA: TOPICAL_CREAM | CUTANEOUS | 4 refills | Status: DC

## 2023-09-20 NOTE — Patient Instructions (Signed)
May apply the steroid cream bid prn to leg  Do labs before next visit  Call if issues

## 2023-09-20 NOTE — Progress Notes (Signed)
   Subjective:    Patient ID: Gerald Hall, male    DOB: 05/24/1935, 88 y.o.   MRN: [id number]  HPI Patient getting over pneumonia Taking his medications as prescribed Does have a rash on his lower leg that does not itch Has standard shortness of breath Severe insomnia see discussion below Nutrition doing well Has a live-in intended to help him with his medicines    Review of Systems     Objective:   Physical Exam General-in no acute distress Eyes-no discharge Lungs-respiratory rate normal, CTA CV-no murmurs,RRR Extremities skin warm dry no edema Neuro grossly normal Behavior normal, alert Inflamed rash on the lower leg        Assessment & Plan:  1. HTN (hypertension), benign HTN- patient seen for follow-up regarding HTN.   Diet, medication compliance, appropriate labs and refills were completed.   Importance of keeping blood pressure under good control to lessen the risk of complications discussed Regular follow-up visits discussed   2. Centrilobular emphysema (HCC) Patient has been counseled to quit smoking Still continues to smoke Continues his other medications   3. Hyperthyroidism Continue methimazole, patient prefers to follow here rather than endocrinology Check labs on next visit  4. Insomnia, unspecified type Patient does have clonazepam 0.5 mg at home he will use sparingly to help with sleep maybe once or twice a week caution drowsiness Patient was on trazodone but can no longer take this because amiodarone Patient states he is miserable without trying something States his quality of life is poor He has a live-in attendant with him He does not want to just use behavioral techniques allow He agrees to only use the medicine sparingly  5. CKD stage 3a, GFR 45-59 ml/min (HCC) Monitor periodically follow-up within 6 to 8 weeks  Tried triamcinolone on the rash

## 2023-09-21 ENCOUNTER — Other Ambulatory Visit: Payer: Self-pay | Admitting: Cardiology

## 2023-09-27 DIAGNOSIS — Z9981 Dependence on supplemental oxygen: Secondary | ICD-10-CM | POA: Diagnosis not present

## 2023-09-27 DIAGNOSIS — F17210 Nicotine dependence, cigarettes, uncomplicated: Secondary | ICD-10-CM | POA: Diagnosis not present

## 2023-09-27 DIAGNOSIS — M4802 Spinal stenosis, cervical region: Secondary | ICD-10-CM | POA: Diagnosis not present

## 2023-09-27 DIAGNOSIS — S72002D Fracture of unspecified part of neck of left femur, subsequent encounter for closed fracture with routine healing: Secondary | ICD-10-CM | POA: Diagnosis not present

## 2023-09-27 DIAGNOSIS — J432 Centrilobular emphysema: Secondary | ICD-10-CM | POA: Diagnosis not present

## 2023-09-27 DIAGNOSIS — Z7984 Long term (current) use of oral hypoglycemic drugs: Secondary | ICD-10-CM | POA: Diagnosis not present

## 2023-09-27 DIAGNOSIS — M159 Polyosteoarthritis, unspecified: Secondary | ICD-10-CM | POA: Diagnosis not present

## 2023-09-27 DIAGNOSIS — Z96642 Presence of left artificial hip joint: Secondary | ICD-10-CM | POA: Diagnosis not present

## 2023-09-27 DIAGNOSIS — E876 Hypokalemia: Secondary | ICD-10-CM | POA: Diagnosis not present

## 2023-09-27 DIAGNOSIS — Z9181 History of falling: Secondary | ICD-10-CM | POA: Diagnosis not present

## 2023-09-27 DIAGNOSIS — J9611 Chronic respiratory failure with hypoxia: Secondary | ICD-10-CM | POA: Diagnosis not present

## 2023-09-27 DIAGNOSIS — Z7951 Long term (current) use of inhaled steroids: Secondary | ICD-10-CM | POA: Diagnosis not present

## 2023-09-29 ENCOUNTER — Other Ambulatory Visit: Payer: Self-pay

## 2023-09-29 DIAGNOSIS — N1831 Chronic kidney disease, stage 3a: Secondary | ICD-10-CM

## 2023-09-29 DIAGNOSIS — Z79899 Other long term (current) drug therapy: Secondary | ICD-10-CM

## 2023-09-29 DIAGNOSIS — E0590 Thyrotoxicosis, unspecified without thyrotoxic crisis or storm: Secondary | ICD-10-CM

## 2023-09-30 ENCOUNTER — Other Ambulatory Visit: Payer: Self-pay

## 2023-09-30 ENCOUNTER — Other Ambulatory Visit: Payer: Self-pay | Admitting: Cardiology

## 2023-09-30 ENCOUNTER — Telehealth: Payer: Self-pay | Admitting: Family Medicine

## 2023-09-30 ENCOUNTER — Other Ambulatory Visit: Payer: Self-pay | Admitting: Family Medicine

## 2023-09-30 ENCOUNTER — Ambulatory Visit: Payer: Self-pay | Admitting: Family Medicine

## 2023-09-30 MED ORDER — DOXYCYCLINE HYCLATE 100 MG PO TABS: 100.0000 mg | ORAL_TABLET | Freq: Two times a day (BID) | ORAL | 0 refills | Status: DC

## 2023-09-30 MED ORDER — PREDNISONE 50 MG PO TABS
50.0000 mg | ORAL_TABLET | Freq: Every day | ORAL | 0 refills | Status: AC
Start: 2023-09-30 — End: 2023-10-05

## 2023-09-30 MED ORDER — AMIODARONE HCL 100 MG PO TABS: 100.0000 mg | ORAL_TABLET | Freq: Every day | ORAL | 3 refills | Status: DC

## 2023-09-30 NOTE — Telephone Encounter (Signed)
Bad cough and

## 2023-09-30 NOTE — Telephone Encounter (Signed)
  Chief Complaint: shortness of breath Symptoms: severe SOB, runny nose, cough, wheezing Frequency: constant Pertinent Negatives: Patient denies fever, chest pain Disposition: [x] ED /[] Urgent Care (no appt availability in office) / [] Appointment(In office/virtual)/ []  [facility]/ [] Home Care/ [x] Refused Recommended Disposition /[] [place] [facility]/ []  Follow-up with PCP Additional Notes: Patient called in reporting he is having severe SOB, cough, wheezing, and runny nose. Patient reports he has a hx of COPD and that his o2 is typically in the 90%'s. Patient reports his o2 has been going as low at 82%, and is currently 87% on 2L. Audible wheezing can be heard across the room. This RN recommended ED, to which patient refused. This RN also tried to recommend UC or some type of in person immediate visit. Patient refused and stated he would not do anything or go anywhere until hearing specifically from Dr. Gerda Diss. This RN advised she would get the message over and to call back or go to ED if worsens before hearing back. Patient verbalized understanding. Office staff at [facility] advised.   Copied from CRM [id number]. Topic: Clinical - Red Word Triage >> Sep 30, 2023  3:05 PM Cassiday T wrote: Red Word that prompted transfer to Nurse Triage: patient has copd and has a bad cold and can't breath Reason for Disposition  Wheezing can be heard across the room  Answer Assessment - Initial Assessment Questions 1. RESPIRATORY STATUS: "Describe your breathing?" (e.g., wheezing, shortness of breath, unable to speak, severe coughing)      Shortness of breath 2. ONSET: "When did this breathing problem begin?"      yesterday 3. PATTERN "Does the difficult breathing come and go, or has it been constant since it started?"      constant 4. SEVERITY: "How bad is your breathing?" (e.g., mild, moderate, severe)    - MILD: No SOB at rest, mild SOB with walking, speaks normally in  sentences, can lie down, no retractions, pulse < 100.    - MODERATE: SOB at rest, SOB with minimal exertion and prefers to sit, cannot lie down flat, speaks in phrases, mild retractions, audible wheezing, pulse 100-120.    - SEVERE: Very SOB at rest, speaks in single words, struggling to breathe, sitting hunched forward, retractions, pulse > 120      severe 5. RECURRENT SYMPTOM: "Have you had difficulty breathing before?" If Yes, ask: "When was the last time?" and "What happened that time?"      no 6. CARDIAC HISTORY: "Do you have any history of heart disease?" (e.g., heart attack, angina, bypass surgery, angioplasty)      no 7. LUNG HISTORY: "Do you have any history of lung disease?"  (e.g., pulmonary embolus, asthma, emphysema)     COPD 8. CAUSE: "What do you think is causing the breathing problem?"      I have a cold and COPD 9. OTHER SYMPTOMS: "Do you have any other symptoms? (e.g., dizziness, runny nose, cough, chest pain, fever)     Runny nose, cough 10. O2 SATURATION MONITOR:  "Do you use an oxygen saturation monitor (pulse oximeter) at home?" If Yes, ask: "What is your reading (oxygen level) today?" "What is your usual oxygen saturation reading?" (e.g., 95%)       87% on 2L  Protocols used: Breathing Difficulty-A-AH

## 2023-10-01 NOTE — Telephone Encounter (Signed)
Thank you for handling this, appreciate you seeing him over the weekend or checking base with him over the weekend, if he needs to do a follow-up visit this week I can work him into my schedule thank you

## 2023-10-04 DIAGNOSIS — Z9981 Dependence on supplemental oxygen: Secondary | ICD-10-CM | POA: Diagnosis not present

## 2023-10-04 DIAGNOSIS — Z9181 History of falling: Secondary | ICD-10-CM | POA: Diagnosis not present

## 2023-10-04 DIAGNOSIS — J432 Centrilobular emphysema: Secondary | ICD-10-CM | POA: Diagnosis not present

## 2023-10-04 DIAGNOSIS — J9611 Chronic respiratory failure with hypoxia: Secondary | ICD-10-CM | POA: Diagnosis not present

## 2023-10-04 DIAGNOSIS — E876 Hypokalemia: Secondary | ICD-10-CM | POA: Diagnosis not present

## 2023-10-04 DIAGNOSIS — Z7951 Long term (current) use of inhaled steroids: Secondary | ICD-10-CM | POA: Diagnosis not present

## 2023-10-04 DIAGNOSIS — Z7984 Long term (current) use of oral hypoglycemic drugs: Secondary | ICD-10-CM | POA: Diagnosis not present

## 2023-10-04 DIAGNOSIS — Z96642 Presence of left artificial hip joint: Secondary | ICD-10-CM | POA: Diagnosis not present

## 2023-10-04 DIAGNOSIS — F17210 Nicotine dependence, cigarettes, uncomplicated: Secondary | ICD-10-CM | POA: Diagnosis not present

## 2023-10-04 DIAGNOSIS — M159 Polyosteoarthritis, unspecified: Secondary | ICD-10-CM | POA: Diagnosis not present

## 2023-10-04 DIAGNOSIS — M4802 Spinal stenosis, cervical region: Secondary | ICD-10-CM | POA: Diagnosis not present

## 2023-10-04 DIAGNOSIS — S72002D Fracture of unspecified part of neck of left femur, subsequent encounter for closed fracture with routine healing: Secondary | ICD-10-CM | POA: Diagnosis not present

## 2023-10-06 ENCOUNTER — Emergency Department (HOSPITAL_COMMUNITY): Payer: Medicare Other

## 2023-10-06 ENCOUNTER — Observation Stay (HOSPITAL_COMMUNITY)
Admission: EM | Admit: 2023-10-06 | Discharge: 2023-10-08 | Disposition: A | Discharge status: Home Health | Payer: Medicare Other | Attending: Family Medicine | Admitting: Family Medicine

## 2023-10-06 ENCOUNTER — Encounter (HOSPITAL_COMMUNITY): Payer: Self-pay

## 2023-10-06 ENCOUNTER — Other Ambulatory Visit: Payer: Self-pay

## 2023-10-06 DIAGNOSIS — S0181XA Laceration without foreign body of other part of head, initial encounter: Principal | ICD-10-CM | POA: Insufficient documentation

## 2023-10-06 DIAGNOSIS — I482 Chronic atrial fibrillation, unspecified: Secondary | ICD-10-CM | POA: Insufficient documentation

## 2023-10-06 DIAGNOSIS — Z79899 Other long term (current) drug therapy: Secondary | ICD-10-CM | POA: Insufficient documentation

## 2023-10-06 DIAGNOSIS — S2241XD Multiple fractures of ribs, right side, subsequent encounter for fracture with routine healing: Secondary | ICD-10-CM | POA: Diagnosis not present

## 2023-10-06 DIAGNOSIS — I110 Hypertensive heart disease with heart failure: Secondary | ICD-10-CM | POA: Diagnosis not present

## 2023-10-06 DIAGNOSIS — R918 Other nonspecific abnormal finding of lung field: Secondary | ICD-10-CM | POA: Diagnosis not present

## 2023-10-06 DIAGNOSIS — I5043 Acute on chronic combined systolic (congestive) and diastolic (congestive) heart failure: Secondary | ICD-10-CM | POA: Diagnosis present

## 2023-10-06 DIAGNOSIS — D72829 Elevated white blood cell count, unspecified: Secondary | ICD-10-CM | POA: Insufficient documentation

## 2023-10-06 DIAGNOSIS — W19XXXA Unspecified fall, initial encounter: Secondary | ICD-10-CM | POA: Diagnosis not present

## 2023-10-06 DIAGNOSIS — Z743 Need for continuous supervision: Secondary | ICD-10-CM | POA: Diagnosis not present

## 2023-10-06 DIAGNOSIS — W01190A Fall on same level from slipping, tripping and stumbling with subsequent striking against furniture, initial encounter: Secondary | ICD-10-CM | POA: Insufficient documentation

## 2023-10-06 DIAGNOSIS — Z043 Encounter for examination and observation following other accident: Secondary | ICD-10-CM | POA: Diagnosis not present

## 2023-10-06 DIAGNOSIS — K118 Other diseases of salivary glands: Secondary | ICD-10-CM | POA: Diagnosis not present

## 2023-10-06 DIAGNOSIS — J9621 Acute and chronic respiratory failure with hypoxia: Secondary | ICD-10-CM | POA: Diagnosis not present

## 2023-10-06 DIAGNOSIS — J9811 Atelectasis: Secondary | ICD-10-CM | POA: Diagnosis not present

## 2023-10-06 DIAGNOSIS — J449 Chronic obstructive pulmonary disease, unspecified: Secondary | ICD-10-CM | POA: Diagnosis not present

## 2023-10-06 DIAGNOSIS — S0990XA Unspecified injury of head, initial encounter: Secondary | ICD-10-CM

## 2023-10-06 DIAGNOSIS — I509 Heart failure, unspecified: Secondary | ICD-10-CM | POA: Diagnosis not present

## 2023-10-06 DIAGNOSIS — Z87891 Personal history of nicotine dependence: Secondary | ICD-10-CM | POA: Insufficient documentation

## 2023-10-06 DIAGNOSIS — R220 Localized swelling, mass and lump, head: Secondary | ICD-10-CM | POA: Diagnosis not present

## 2023-10-06 DIAGNOSIS — Z96643 Presence of artificial hip joint, bilateral: Secondary | ICD-10-CM | POA: Diagnosis not present

## 2023-10-06 DIAGNOSIS — Y92009 Unspecified place in unspecified non-institutional (private) residence as the place of occurrence of the external cause: Secondary | ICD-10-CM | POA: Insufficient documentation

## 2023-10-06 DIAGNOSIS — Z9981 Dependence on supplemental oxygen: Secondary | ICD-10-CM | POA: Insufficient documentation

## 2023-10-06 DIAGNOSIS — J90 Pleural effusion, not elsewhere classified: Secondary | ICD-10-CM | POA: Diagnosis not present

## 2023-10-06 DIAGNOSIS — S199XXA Unspecified injury of neck, initial encounter: Secondary | ICD-10-CM | POA: Diagnosis not present

## 2023-10-06 DIAGNOSIS — R2689 Other abnormalities of gait and mobility: Secondary | ICD-10-CM | POA: Diagnosis not present

## 2023-10-06 DIAGNOSIS — Z7951 Long term (current) use of inhaled steroids: Secondary | ICD-10-CM | POA: Insufficient documentation

## 2023-10-06 DIAGNOSIS — N179 Acute kidney failure, unspecified: Secondary | ICD-10-CM | POA: Insufficient documentation

## 2023-10-06 DIAGNOSIS — R296 Repeated falls: Secondary | ICD-10-CM

## 2023-10-06 DIAGNOSIS — Z7982 Long term (current) use of aspirin: Secondary | ICD-10-CM | POA: Diagnosis not present

## 2023-10-06 DIAGNOSIS — Z7984 Long term (current) use of oral hypoglycemic drugs: Secondary | ICD-10-CM | POA: Insufficient documentation

## 2023-10-06 DIAGNOSIS — I10 Essential (primary) hypertension: Secondary | ICD-10-CM | POA: Diagnosis present

## 2023-10-06 DIAGNOSIS — I517 Cardiomegaly: Secondary | ICD-10-CM | POA: Diagnosis not present

## 2023-10-06 DIAGNOSIS — J439 Emphysema, unspecified: Secondary | ICD-10-CM | POA: Diagnosis not present

## 2023-10-06 DIAGNOSIS — I130 Hypertensive heart and chronic kidney disease with heart failure and stage 1 through stage 4 chronic kidney disease, or unspecified chronic kidney disease: Secondary | ICD-10-CM | POA: Diagnosis not present

## 2023-10-06 DIAGNOSIS — N1831 Chronic kidney disease, stage 3a: Secondary | ICD-10-CM | POA: Diagnosis not present

## 2023-10-06 DIAGNOSIS — Z23 Encounter for immunization: Secondary | ICD-10-CM | POA: Diagnosis not present

## 2023-10-06 DIAGNOSIS — I700 Atherosclerosis of aorta: Secondary | ICD-10-CM | POA: Diagnosis not present

## 2023-10-06 LAB — BASIC METABOLIC PANEL
Anion gap: 11 (ref 5–15)
BUN: 58 mg/dL — ABNORMAL HIGH (ref 8–23)
CO2: 26 mmol/L (ref 22–32)
Calcium: 9.5 mg/dL (ref 8.9–10.3)
Chloride: 105 mmol/L (ref 98–111)
Creatinine, Ser: 1.85 mg/dL — ABNORMAL HIGH (ref 0.61–1.24)
GFR, Estimated: 35 mL/min — ABNORMAL LOW (ref >60)
Glucose, Bld: 98 mg/dL (ref 70–99)
Potassium: 4.1 mmol/L (ref 3.5–5.1)
Sodium: 142 mmol/L (ref 135–145)

## 2023-10-06 LAB — CBC WITH DIFFERENTIAL/PLATELET
Abs Immature Granulocytes: 0.11 10*3/uL — ABNORMAL HIGH (ref 0.00–0.07)
Basophils Absolute: 0 10*3/uL (ref 0.0–0.1)
Basophils Relative: 0 %
Eosinophils Absolute: 0.1 10*3/uL (ref 0.0–0.5)
Eosinophils Relative: 1 %
HCT: 43.6 % (ref 39.0–52.0)
Hemoglobin: 13.5 g/dL (ref 13.0–17.0)
Immature Granulocytes: 1 %
Lymphocytes Relative: 12 %
Lymphs Abs: 1.6 10*3/uL (ref 0.7–4.0)
MCH: 30.1 pg (ref 26.0–34.0)
MCHC: 31 g/dL (ref 30.0–36.0)
MCV: 97.1 fL (ref 80.0–100.0)
Monocytes Absolute: 1.7 10*3/uL — ABNORMAL HIGH (ref 0.1–1.0)
Monocytes Relative: 13 %
Neutro Abs: 9.5 10*3/uL — ABNORMAL HIGH (ref 1.7–7.7)
Neutrophils Relative %: 73 %
Platelets: 198 10*3/uL (ref 150–400)
RBC: 4.49 MIL/uL (ref 4.22–5.81)
RDW: 16.4 % — ABNORMAL HIGH (ref 11.5–15.5)
WBC: 13.1 10*3/uL — ABNORMAL HIGH (ref 4.0–10.5)
nRBC: 0.2 % (ref 0.0–0.2)

## 2023-10-06 LAB — BRAIN NATRIURETIC PEPTIDE: B Natriuretic Peptide: 1445 pg/mL — ABNORMAL HIGH (ref 0.0–100.0)

## 2023-10-06 LAB — CK: Total CK: 66 U/L (ref 49–397)

## 2023-10-06 LAB — TROPONIN I (HIGH SENSITIVITY)
Troponin I (High Sensitivity): 28 ng/L — ABNORMAL HIGH (ref <18)
Troponin I (High Sensitivity): 27 ng/L — ABNORMAL HIGH (ref <18)

## 2023-10-06 MED ORDER — ACETAMINOPHEN 325 MG PO TABS
650.0000 mg | ORAL_TABLET | Freq: Four times a day (QID) | ORAL | Status: DC | PRN
Start: 2023-10-06 — End: 2023-10-08
  Administered 2023-10-06: 650 mg via ORAL
  Filled 2023-10-06: qty 2

## 2023-10-06 MED ORDER — FINASTERIDE 5 MG PO TABS
5.0000 mg | ORAL_TABLET | Freq: Every day | ORAL | Status: DC
  Administered 2023-10-06 – 2023-10-08 (×3): 5 mg via ORAL
  Filled 2023-10-06 (×3): qty 1

## 2023-10-06 MED ORDER — POLYVINYL ALCOHOL 1.4 % OP SOLN: 1.0000 [drp] | OPHTHALMIC | Status: DC | PRN

## 2023-10-06 MED ORDER — FUROSEMIDE 10 MG/ML IJ SOLN
20.0000 mg | Freq: Two times a day (BID) | INTRAMUSCULAR | Status: DC
  Administered 2023-10-06 – 2023-10-07 (×2): 20 mg via INTRAVENOUS
  Filled 2023-10-06 (×2): qty 2

## 2023-10-06 MED ORDER — SODIUM CHLORIDE 0.9 % IV SOLN: INTRAVENOUS | Status: AC | PRN

## 2023-10-06 MED ORDER — TRAZODONE HCL 50 MG PO TABS
50.0000 mg | ORAL_TABLET | Freq: Every evening | ORAL | Status: DC | PRN
  Administered 2023-10-06: 50 mg via ORAL
  Filled 2023-10-06: qty 1

## 2023-10-06 MED ORDER — TRAMADOL HCL 50 MG PO TABS
50.0000 mg | ORAL_TABLET | Freq: Four times a day (QID) | ORAL | Status: DC | PRN
  Administered 2023-10-07: 50 mg via ORAL
  Filled 2023-10-06: qty 1

## 2023-10-06 MED ORDER — ONDANSETRON HCL 4 MG/2ML IJ SOLN: 4.0000 mg | Freq: Four times a day (QID) | INTRAMUSCULAR | Status: DC | PRN

## 2023-10-06 MED ORDER — SODIUM CHLORIDE 0.9 % IV SOLN
INTRAVENOUS | Status: AC
Start: 2023-10-06 — End: 2023-10-07

## 2023-10-06 MED ORDER — AMIODARONE HCL 200 MG PO TABS
100.0000 mg | ORAL_TABLET | Freq: Every day | ORAL | Status: DC
  Administered 2023-10-07 – 2023-10-08 (×2): 100 mg via ORAL
  Filled 2023-10-06 (×2): qty 1

## 2023-10-06 MED ORDER — ASPIRIN 81 MG PO TBEC
81.0000 mg | DELAYED_RELEASE_TABLET | Freq: Two times a day (BID) | ORAL | Status: DC
  Administered 2023-10-06 – 2023-10-08 (×4): 81 mg via ORAL
  Filled 2023-10-06 (×4): qty 1

## 2023-10-06 MED ORDER — SODIUM CHLORIDE 0.9% FLUSH
3.0000 mL | Freq: Two times a day (BID) | INTRAVENOUS | Status: DC
  Administered 2023-10-07 – 2023-10-08 (×2): 3 mL via INTRAVENOUS

## 2023-10-06 MED ORDER — MOMETASONE FURO-FORMOTEROL FUM 200-5 MCG/ACT IN AERO
2.0000 | INHALATION_SPRAY | Freq: Two times a day (BID) | RESPIRATORY_TRACT | Status: DC
  Administered 2023-10-06 – 2023-10-08 (×4): 2 via RESPIRATORY_TRACT
  Filled 2023-10-06: qty 8.8

## 2023-10-06 MED ORDER — HEPARIN SODIUM (PORCINE) 5000 UNIT/ML IJ SOLN
5000.0000 [IU] | Freq: Three times a day (TID) | INTRAMUSCULAR | Status: DC
  Administered 2023-10-06 – 2023-10-08 (×5): 5000 [IU] via SUBCUTANEOUS
  Filled 2023-10-06 (×5): qty 1

## 2023-10-06 MED ORDER — SODIUM CHLORIDE 0.9% FLUSH
3.0000 mL | Freq: Two times a day (BID) | INTRAVENOUS | Status: DC
  Administered 2023-10-07 – 2023-10-08 (×3): 3 mL via INTRAVENOUS

## 2023-10-06 MED ORDER — METHIMAZOLE 5 MG PO TABS
5.0000 mg | ORAL_TABLET | Freq: Every day | ORAL | Status: DC
  Administered 2023-10-06 – 2023-10-08 (×3): 5 mg via ORAL
  Filled 2023-10-06 (×3): qty 1

## 2023-10-06 MED ORDER — TETANUS-DIPHTH-ACELL PERTUSSIS 5-2.5-18.5 LF-MCG/0.5 IM SUSY
0.5000 mL | PREFILLED_SYRINGE | Freq: Once | INTRAMUSCULAR | Status: AC
  Administered 2023-10-06: 0.5 mL via INTRAMUSCULAR
  Filled 2023-10-06: qty 0.5

## 2023-10-06 MED ORDER — ONDANSETRON HCL 4 MG PO TABS: 4.0000 mg | ORAL_TABLET | Freq: Four times a day (QID) | ORAL | Status: DC | PRN

## 2023-10-06 MED ORDER — TAMSULOSIN HCL 0.4 MG PO CAPS
0.4000 mg | ORAL_CAPSULE | Freq: Every day | ORAL | Status: DC
  Administered 2023-10-06 – 2023-10-07 (×2): 0.4 mg via ORAL
  Filled 2023-10-06 (×2): qty 1

## 2023-10-06 MED ORDER — ROPINIROLE HCL 0.25 MG PO TABS
0.2500 mg | ORAL_TABLET | Freq: Every day | ORAL | Status: DC
  Administered 2023-10-06 – 2023-10-07 (×2): 0.25 mg via ORAL
  Filled 2023-10-06 (×2): qty 1

## 2023-10-06 MED ORDER — BISACODYL 10 MG RE SUPP: 10.0000 mg | Freq: Every day | RECTAL | Status: DC | PRN

## 2023-10-06 MED ORDER — CARVEDILOL 3.125 MG PO TABS
3.1250 mg | ORAL_TABLET | Freq: Two times a day (BID) | ORAL | Status: DC
  Administered 2023-10-06 – 2023-10-08 (×4): 3.125 mg via ORAL
  Filled 2023-10-06 (×4): qty 1

## 2023-10-06 MED ORDER — ACETAMINOPHEN 650 MG RE SUPP: 650.0000 mg | Freq: Four times a day (QID) | RECTAL | Status: DC | PRN

## 2023-10-06 MED ORDER — FUROSEMIDE 10 MG/ML IJ SOLN
20.0000 mg | Freq: Once | INTRAMUSCULAR | Status: AC
  Administered 2023-10-06: 20 mg via INTRAVENOUS
  Filled 2023-10-06: qty 2

## 2023-10-06 MED ORDER — SODIUM CHLORIDE 0.9% FLUSH: 3.0000 mL | INTRAVENOUS | Status: DC | PRN

## 2023-10-06 MED ORDER — POLYETHYLENE GLYCOL 3350 17 G PO PACK
17.0000 g | PACK | Freq: Every day | ORAL | Status: DC | PRN
Start: 2023-10-06 — End: 2023-10-08

## 2023-10-06 NOTE — ED Notes (Addendum)
Patient back from CT.

## 2023-10-06 NOTE — ED Notes (Signed)
Helped patient to the restroom and back to bed.

## 2023-10-06 NOTE — H&P (Signed)
Patient Demographics:    Gerald Hall, is a 88 y.o. male  MRN: [id number]   DOB - 12-11-1934  Admit Date - 10/06/2023  Outpatient Primary MD for the patient is Babs Sciara, MD   Assessment & Plan:   Assessment and Plan: 1) acute on chronic Combined systolic and diastolic dysfunction CHF with EF of 35% and grade 1 diastolic dysfunction--  -BNP 1,445 CT chest with moderate right pleural effusion associated with compressive atelectasis  Troponin 28 >>27 CK 66 -- IV Lasix as ordered -Fluid input and output monitoring  2) recurrent falls with scalp injury and multiple subacute rib fractures -CT head without acute findings -CT C-spine without acute findings -There are subacute/healing fractures of right-sided third through ninth ribs (displaced fractures of the lateral right sixth, seventh and eighth ribs, ) He apparently is falling 4 times over the last 4 days--patient apparently has generalized weakness dyspnea on exertion and poor endurance -Also has knee pain with ambulation -He is able to ambulate to the bathroom back-and-forth for longer distances he uses a wheelchair--- lately every time he ambulates to the bathroom he gets short winded and at risk for falling -- Physical therapy eval  noted -Pain control and physical therapy as requested  3)AKI----acute kidney injury on CKD stage -3A Creatinine is up to 1.85 recent baseline between 1.3-1.6 --renally adjust medications, avoid nephrotoxic agents / dehydration  / hypotension   4)Leukocytosis--- WBC 13.1 suspect this is reactive due to recurrent falls  5)COPD/ongoing tobacco abuse/chronic hypoxic respiratory failure--no acute exacerbation at this time -Hold off on steroids -Bronchodilators and supplemental oxygen -PTA patient uses O2 2 to 3 L as needed at  home  6)Bilateral parathyroid gland lesions-right parotid gland measuring 9 mm----outpatient follow-up advised  7)chronic atrial fibrillation--not on full anticoagulation due to recurrent falls -Continue amiodarone and Coreg for rate control  8) hyperthyroidism--continue Tapazole and Coreg  9)BPH--- continue Flomax  10) social/ethics --- discussed with patient and patient's daughter Gerald Hall -They request DNR/DNI status  Status is: Inpatient  Dispo: The patient is from: Home              Anticipated d/c is to:  TBD              Anticipated d/c date is: 2 days              Patient currently is not medically stable to d/c. Barriers: Not Clinically Stable-   With History of - Reviewed by me  Past Medical History:  Diagnosis Date   Aortic atherosclerosis (HCC) 06/23/2022   Seen on CAT scan   Arthritis    Balance problem    Uses a walker   Blood in urine    Sees urology yearly   Chronic HFrEF (heart failure with reduced ejection fraction) (HCC)    Chronic kidney disease, stage 3 (HCC)    Coronary artery calcification seen on CT scan    Deafness in right ear age 43  Hyperlipidemia    Hypertension    Intractable nausea and vomiting 02/04/2023   Prolonged QT interval    PSVT (paroxysmal supraventricular tachycardia) (HCC)    Temporal arteritis (HCC) 2016   Urinary incontinence       Past Surgical History:  Procedure Laterality Date   BACK SURGERY  2010   [facility] neurosurgical clinic L4-L5 decompressive laminectomy   BELPHAROPTOSIS REPAIR     CATARACT EXTRACTION Bilateral 07/30/14, 08/13/2014   CERVICAL SPINE SURGERY  09/01/2015   C 4-5 , [facility] [place]   COLONOSCOPY  12/19/2007   RMR: 1. External hemorrohids, otherwise normal rectum.2. Normal colon. 3. Normal terminal ileum.   COLONOSCOPY N/A 12/13/2014   Procedure: COLONOSCOPY;  Surgeon: Corbin Ade, MD;  Location: [facility];  Service: Endoscopy;  Laterality: N/A;  11:15 Pt Request Time   HIP  ARTHROPLASTY Left 01/27/2023   Procedure: ARTHROPLASTY BIPOLAR HIP (HEMIARTHROPLASTY);  Surgeon: Oliver Barre, MD;  Location: [facility] ORS;  Service: Orthopedics;  Laterality: Left;   KNEE SURGERY Left    around 2000, arthroscopic   TOTAL HIP ARTHROPLASTY Right 02/23/2017   Procedure: TOTAL HIP ARTHROPLASTY ANTERIOR APPROACH;  Surgeon: Kennedy Bucker, MD;  Location: [facility] ORS;  Service: Orthopedics;  Laterality: Right;   Chief Complaint  Patient presents with   Fall     HPI:    Gerald Hall  is a 88 y.o. male with past medical history relevant for hearing loss, right worse than left, combined systolic and diastolic dysfunction CHF with EF of 35% and grade 1 diastolic dysfunction, tobacco abuse with COPD and chronic hypoxic respiratory failure on 2 L of oxygen at home, chronic atrial fibrillation, recurrent falls and CKD 3A resents by EMS after an unwitnessed fall at home with bleeding scalp injury -Patient has 24/7 care around the house -Patient's daughter Gerald Hall and patient's paid caregiver both at bedside -He apparently is falling 4 times over the last 4 days--patient apparently has generalized weakness dyspnea on exertion and poor endurance -Also has knee pain with ambulation -He is able to ambulate to the bathroom back-and-forth for longer distances he uses a wheelchair--- lately every time he ambulates to the bathroom he gets short winded and at risk for falling -- CT chest with moderate right pleural effusion associated with compressive atelectasis in the right lower lobe, no pneumothorax no consolidation no mass.. -- There are subacute/healing fractures of right-sided third through ninth ribs (displaced fractures of the lateral right sixth, seventh and eighth ribs, ) -Bilateral parathyroid gland lesions-right parotid gland measuring 9 mm.  -CT head without acute findings Troponin 28 >>27 CK 66 -BNP 1,445 -WBC 13.1 hemoglobin 13.5 platelets 198 -Creatinine is up to 1.85 recent baseline  between 1.3-1.6   Review of systems:    In addition to the HPI above,   A full Review of  Systems was done, all other systems reviewed are negative except as noted above in HPI , .    Social History:  Reviewed by me   Social History   Tobacco Use   Smoking status: Former    Current packs/day: 0.50    Types: Cigarettes   Smokeless tobacco: Never   Tobacco comments:    08/18/15 1/2 - 3/4 pack cigarettes daily  Substance Use Topics   Alcohol use: Yes    Alcohol/week: 0.0 standard drinks of alcohol    Comment: mixed drink 1-2 times per month, social    Family History :  Reviewed by me    Family  History  Problem Relation Age of Onset   Colon cancer Brother    Diabetes Brother    Diabetes Brother     Home Medications:   Prior to Admission medications   Medication Sig Start Date End Date Taking? Authorizing Provider  acetaminophen (TYLENOL) 500 MG tablet Take 1,000 mg by mouth every 6 (six) hours as needed for mild pain or headache.   Yes [provider]  albuterol (VENTOLIN HFA) 108 (90 Base) MCG/ACT inhaler Inhale 2 puffs into the lungs every 4 (four) hours as needed for wheezing. 09/01/22  Yes Babs Sciara, MD  amiodarone (PACERONE) 200 MG tablet Take 100 mg by mouth daily.   Yes [provider]  aspirin EC 81 MG tablet Take 1 tablet (81 mg total) by mouth 2 (two) times daily. Swallow whole. Patient taking differently: Take 81 mg by mouth daily. Swallow whole. 08/03/23  Yes Babs Sciara, MD  budesonide-formoterol (SYMBICORT) 160-4.5 MCG/ACT inhaler Inhale 2 puffs into the lungs 2 (two) times daily. 08/27/22  Yes Luking, Jonna Coup, MD  calcium carbonate (OS-CAL) 600 MG TABS tablet Take 600 mg by mouth daily with breakfast.   Yes [provider]  carvedilol (COREG) 3.125 MG tablet TAKE ONE TABLET BY MOUTH TWICE DAILY 08/26/23  Yes Luking, Scott A, MD  diclofenac Sodium (VOLTAREN) 1 % GEL Apply 2 g topically daily as needed (pain). 04/05/23 04/04/24  Yes [provider]  diphenhydrAMINE (BENADRYL) 25 mg capsule Take 25 mg by mouth at bedtime.   Yes [provider]  doxycycline (VIBRA-TABS) 100 MG tablet Take 1 tablet (100 mg total) by mouth 2 (two) times daily. 09/30/23  Yes Cook, Jayce G, DO  feeding supplement, GLUCERNA SHAKE, (GLUCERNA SHAKE) LIQD Take 237 mLs by mouth 3 (three) times daily between meals. Patient taking differently: Take 237 mLs by mouth daily. 02/18/23  Yes Narda Bonds, MD  finasteride (PROSCAR) 5 MG tablet TAKE ONE TABLET BY MOUTH EVERY DAY 08/26/23  Yes Luking, Jonna Coup, MD  fluticasone (FLONASE) 50 MCG/ACT nasal spray Place 2 sprays into both nostrils at bedtime. 08/27/22  Yes Babs Sciara, MD  GNP VITAMIN C 500 MG tablet TAKE ONE TABLET BY MOUTH EVERY MORNING 09/20/23  Yes Luking, Jonna Coup, MD  GNP VITAMIN D3 EXTRA STRENGTH 25 MCG (1000 UT) tablet TAKE ONE TABLET BY MOUTH EVERY MORNING 08/01/23  Yes Luking, Scott A, MD  ipratropium-albuterol (DUONEB) 0.5-2.5 (3) MG/3ML SOLN Take 3 mLs by nebulization every 4 (four) hours as needed. 08/27/22  Yes Luking, Jonna Coup, MD  JARDIANCE 10 MG TABS tablet TAKE ONE TABLET BY MOUTH ONCE DAILY BEFORE BREAKFAST Patient taking differently: Take 10 mg by mouth daily. 11/17/22  Yes Jonelle Sidle, MD  methimazole (TAPAZOLE) 5 MG tablet TAKE ONE TABLET BY MOUTH EVERY DAY 08/31/23  Yes Luking, Jonna Coup, MD  MYRBETRIQ 50 MG TB24 tablet Take 50 mg by mouth daily.   Yes [provider]  OXYGEN Inhale 2 L into the lungs as needed (shortness of breath).   Yes [provider]  oxymetazoline (AFRIN) 0.05 % nasal spray Place 1 spray into both nostrils at bedtime.   Yes [provider]  polyethylene glycol (MIRALAX / GLYCOLAX) 17 g packet Take 17 g by mouth daily as needed for mild constipation.   Yes [provider]  potassium chloride (KLOR-CON) 10 MEQ tablet TAKE TWO TABLETS BY MOUTH EVERY DAY 07/06/23  Yes Luking, Jonna Coup, MD  predniSONE  (DELTASONE) 50 MG  tablet Take 50 mg by mouth daily with breakfast. 09/30/23 10/06/23 Yes [provider]  rOPINIRole (REQUIP) 2 MG tablet TAKE ONE TABLET BY MOUTH TWICE DAILY IN THE AFTERNOON AND AT BEDTIME Patient taking differently: Take 2 mg by mouth at bedtime. During the day as needed once daily for leg spasms 08/31/23  Yes Luking, Scott A, MD  tamsulosin (FLOMAX) 0.4 MG CAPS capsule TAKE (1) CAPSULE BY MOUTH TWICE DAILY. 06/02/23  Yes Babs Sciara, MD  torsemide (DEMADEX) 20 MG tablet 1/2 tablet daily in the morning 09/05/23  Yes Luking, Scott A, MD  traMADol (ULTRAM) 50 MG tablet TAKE ONE TABLET BY MOUTH THREE TIMES DAILY AS NEEDED FOR PAIN Patient taking differently: Take 50 mg by mouth in the morning, at noon, and at bedtime. 09/12/23  Yes Babs Sciara, MD  traZODone (DESYREL) 50 MG tablet Take 75 mg by mouth at bedtime. 06/13/23  Yes [provider]  triamcinolone cream (KENALOG) 0.1 % Apply thin amount two times daily 09/20/23  Yes Luking, Jonna Coup, MD  Zinc Acetate, Oral, (ZINC ACETATE PO) Take 1 tablet by mouth daily.   Yes [provider]  ZINC PLUS VITAMIN C 50-90 MG CAPS TAKE ONE TABLET BY MOUTH EVERY MORNING 08/26/23  Yes Luking, Scott A, MD  polyvinyl alcohol (LIQUIFILM TEARS) 1.4 % ophthalmic solution Place 1 drop into both eyes as needed for dry eyes. Patient not taking: Reported on 10/06/2023    [provider]     Allergies:     Allergies  Allergen Reactions   Cephalexin Diarrhea    General weakness   Penicillins Nausea And Vomiting and Rash    GI UPSET     Physical Exam:   Vitals  Blood pressure 122/77, pulse 80, temperature 98.5 F (36.9 C), resp. rate 18, height 5\' 8"  (1.727 m), weight 91.1 kg, SpO2 92%.  Physical Examination: General appearance -  sleepy, arousable in no distress  Mental status -easily arousable, oriented to person, place, and time,  Eyes - sclera anicteric Nose- Bryce 3 L/min Neck - supple, no JVD  elevation , Chest -diminished breath sounds on the right, no wheezing no rhonchi heart - S1 and S2 normal, irregular  Abdomen - soft, nontender, nondistended, +BS Neurological - screening mental status exam normal, neck supple without rigidity, cranial nerves II through XII intact, DTR's normal and symmetric Extremities - 1 +ve pedal edema noted, intact peripheral pulses  Skin - warm, dry   Data Review:    CBC Recent Labs  Lab 10/06/23 0724  WBC 13.1*  HGB 13.5  HCT 43.6  PLT 198  MCV 97.1  MCH 30.1  MCHC 31.0  RDW 16.4*  LYMPHSABS 1.6  MONOABS 1.7*  EOSABS 0.1  BASOSABS 0.0   ------------------------------------------------------------------------------------------------------------------  Chemistries  Recent Labs  Lab 10/06/23 0724  NA 142  K 4.1  CL 105  CO2 26  GLUCOSE 98  BUN 58*  CREATININE 1.85*  CALCIUM 9.5   ------------------------------------------------------------------------------------------------------------------ estimated creatinine clearance is 31.4 mL/min (A) (by C-G formula based on SCr of 1.85 mg/dL (H)). ------------------------------------------------------------------------------------------------------------------ ------------------------------------------------------------------------------------------------------------------    Component Value Date/Time   BNP 1,445.0 (H) 10/06/2023 0724   Urinalysis    Component Value Date/Time   COLORURINE YELLOW 02/03/2023 0932   APPEARANCEUR HAZY (A) 02/03/2023 0932   LABSPEC 1.015 02/03/2023 0932   PHURINE 5.0 02/03/2023 0932   GLUCOSEU 150 (A) 02/03/2023 0932   HGBUR NEGATIVE 02/03/2023 0932   BILIRUBINUR negative 06/15/2023 1435   KETONESUR negative  06/15/2023 1435   KETONESUR NEGATIVE 02/03/2023 0932   PROTEINUR NEGATIVE 02/03/2023 0932   UROBILINOGEN 0.2 06/15/2023 1435   NITRITE Positive (A) 06/15/2023 1435   NITRITE NEGATIVE 02/03/2023 0932   LEUKOCYTESUR Negative 06/15/2023 1435    LEUKOCYTESUR NEGATIVE 02/03/2023 0932     Imaging Results:    CT Chest Wo Contrast Result Date: 10/06/2023 CLINICAL DATA:  Pneumonia, complication suspected, xray done.  Fall. EXAM: CT CHEST WITHOUT CONTRAST TECHNIQUE: Multidetector CT imaging of the chest was performed following the standard protocol without IV contrast. RADIATION DOSE REDUCTION: This exam was performed according to the departmental dose-optimization program which includes automated exposure control, adjustment of the mA and/or kV according to patient size and/or use of iterative reconstruction technique. COMPARISON:  CT scan chest from 11/04/2022. FINDINGS: Cardiovascular: Mild cardiomegaly. No pericardial effusion. No aortic aneurysm. There are coronary artery calcifications, in keeping with coronary artery disease. There are also mild-to-moderate peripheral atherosclerotic vascular calcifications of thoracic aorta and its major branches. Mediastinum/Nodes: Visualized thyroid gland appears grossly unremarkable. No solid / cystic mediastinal masses. The esophagus is nondistended precluding optimal assessment. There are few mildly prominent mediastinal and hilar lymph nodes, which do not meet the size criteria for lymphadenopathy and appear grossly similar to the prior study, favoring benign etiology. No axillary lymphadenopathy by size criteria. Lungs/Pleura: The central tracheo-bronchial tree is patent. There is moderate right pleural effusion with associated compressive atelectatic changes in the right lower lobe. There are minimal emphysematous changes predominantly in the right upper lobe. There are also dependent changes throughout bilateral lungs. Previously noted 5 mm nodule in the right upper lobe is less conspicuous on today's exam. No new or suspicious lung nodule. No mass, consolidation or pneumothorax. No left pleural effusion. Upper Abdomen: There is a 2.6 x 3.4 cm hypoattenuating structure in the right hepatic dome, segment  8, favored to represent a cyst. There are hyperattenuating contents in the partially visualized gallbladder, which may represent concentrated bile versus sludge. There are scattered calcified granuloma in the spleen. There is dense calcification in the right adrenal gland, which may represent sequela of prior hemorrhage or infection. Remaining visualized upper abdominal viscera within normal limits. Musculoskeletal: The visualized soft tissues of the chest wall are grossly unremarkable. No suspicious osseous lesions. There are mild to moderate multilevel degenerative changes in the visualized spine. There are multiple subacute/healing right-sided rib fractures involving anterior aspect of right third through eighth ribs and posteromedial aspect of right fourth, sixth and ninth ribs. The fracture margins are not sharp and there is surrounding callus formation. IMPRESSION: 1. There is moderate right pleural effusion with associated compressive atelectatic changes in the right lower lobe. Bilateral lungs are otherwise clear of mass, consolidation or pneumothorax. No left pleural effusion. No suspicious lung nodule. 2. There are subacute/healing fractures of right-sided third through ninth ribs, as described above. 3. Multiple other nonacute observations, as described above. Aortic Atherosclerosis (ICD10-I70.0) and Emphysema (ICD10-J43.9). Electronically Signed   By: Jules Schick M.D.   On: 10/06/2023 08:53   CT Head Wo Contrast Result Date: 10/06/2023 CLINICAL DATA:  Head trauma, minor (Age >= 65y); Neck trauma (Age >= 65y) EXAM: CT HEAD WITHOUT CONTRAST CT CERVICAL SPINE WITHOUT CONTRAST TECHNIQUE: Multidetector CT imaging of the head and cervical spine was performed following the standard protocol without intravenous contrast. Multiplanar CT image reconstructions of the cervical spine were also generated. RADIATION DOSE REDUCTION: This exam was performed according to the departmental dose-optimization program  which includes automated exposure control, adjustment  of the mA and/or kV according to patient size and/or use of iterative reconstruction technique. COMPARISON:  CT Head andCTA head/neck 02/14/23 FINDINGS: CT HEAD FINDINGS Brain: No hemorrhage. No hydrocephalus. No extra-axial fluid collection. No CT evidence of an acute cortical infarct. There is background moderate to severe chronic microvascular ischemic change. 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 Vascular: No hyperdense vessel or unexpected calcification. Skull: Soft tissue swelling left frontal scalp. No evidence of underlying calvarial fracture. Sinuses/Orbits: No middle ear effusion. Small right mastoid effusion. Mild mucosal thickening in the right maxillary sinus. Bilateral lens replacement. Orbits are otherwise unremarkable. Other: None. CT CERVICAL SPINE FINDINGS Limitations: Motion degraded exam Alignment: Straightening of the normal cervical lordosis. There is trace anterolisthesis of C2 on C3 that is new compared to 02/14/2023 Skull base and vertebrae: Incomplete fusion of the anterior arch of C1. Apparent cortical irregularity along the anterior and posterior aspect of the base of the dens and C2 vertebral body is favored to be artifactual and related to motion artifact. There is no evidence of prevertebral soft tissue swelling in this region. Soft tissues and spinal canal: Redemonstrated soft tissue density lesions in the bilateral parotid glands with interval development of a new lesion in the inferior aspect of the right parotid gland measuring 9 mm (series 8, image 41). These remain indeterminate, ENT referral for further workup and evaluation is recommended, if not previously performed. Disc levels: There is likely at least moderate spinal canal narrowing at C6-C7 Upper chest: Small right pleural effusion, new from prior exam Other: None IMPRESSION: 1. No CT evidence of intracranial injury. 2. Soft tissue swelling left frontal scalp. No  evidence of underlying calvarial fracture. 3. Motion degraded exam of the cervical spine. Apparent cortical irregularity along the anterior and posterior aspects of the base of the dens and C2 vertebral body is favored to be artifactual and related to motion artifact. There is no evidence of prevertebral soft tissue swelling in this region. If there is high clinical concern for fracture, consider a repeat CT of the cervical spine. 4. Redemonstrated soft tissue density lesions in the bilateral parotid glands with interval development of a new lesion in the inferior aspect of the right parotid gland measuring 9 mm. These remain indeterminate, ENT referral for further workup and evaluation is recommended, if not previously performed. 5. Small right pleural effusion, new from prior exam. Electronically Signed   By: Lorenza Cambridge M.D.   On: 10/06/2023 08:27   CT Cervical Spine Wo Contrast Result Date: 10/06/2023 CLINICAL DATA:  Head trauma, minor (Age >= 65y); Neck trauma (Age >= 65y) EXAM: CT HEAD WITHOUT CONTRAST CT CERVICAL SPINE WITHOUT CONTRAST TECHNIQUE: Multidetector CT imaging of the head and cervical spine was performed following the standard protocol without intravenous contrast. Multiplanar CT image reconstructions of the cervical spine were also generated. RADIATION DOSE REDUCTION: This exam was performed according to the departmental dose-optimization program which includes automated exposure control, adjustment of the mA and/or kV according to patient size and/or use of iterative reconstruction technique. COMPARISON:  CT Head andCTA head/neck 02/14/23 FINDINGS: CT HEAD FINDINGS Brain: No hemorrhage. No hydrocephalus. No extra-axial fluid collection. No CT evidence of an acute cortical infarct. There is background moderate to severe chronic microvascular ischemic change. 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 Vascular: No hyperdense vessel or unexpected calcification. Skull: Soft tissue swelling left  frontal scalp. No evidence of underlying calvarial fracture. Sinuses/Orbits: No middle ear effusion. Small right mastoid effusion. Mild mucosal thickening in  the right maxillary sinus. Bilateral lens replacement. Orbits are otherwise unremarkable. Other: None. CT CERVICAL SPINE FINDINGS Limitations: Motion degraded exam Alignment: Straightening of the normal cervical lordosis. There is trace anterolisthesis of C2 on C3 that is new compared to 02/14/2023 Skull base and vertebrae: Incomplete fusion of the anterior arch of C1. Apparent cortical irregularity along the anterior and posterior aspect of the base of the dens and C2 vertebral body is favored to be artifactual and related to motion artifact. There is no evidence of prevertebral soft tissue swelling in this region. Soft tissues and spinal canal: Redemonstrated soft tissue density lesions in the bilateral parotid glands with interval development of a new lesion in the inferior aspect of the right parotid gland measuring 9 mm (series 8, image 41). These remain indeterminate, ENT referral for further workup and evaluation is recommended, if not previously performed. Disc levels: There is likely at least moderate spinal canal narrowing at C6-C7 Upper chest: Small right pleural effusion, new from prior exam Other: None IMPRESSION: 1. No CT evidence of intracranial injury. 2. Soft tissue swelling left frontal scalp. No evidence of underlying calvarial fracture. 3. Motion degraded exam of the cervical spine. Apparent cortical irregularity along the anterior and posterior aspects of the base of the dens and C2 vertebral body is favored to be artifactual and related to motion artifact. There is no evidence of prevertebral soft tissue swelling in this region. If there is high clinical concern for fracture, consider a repeat CT of the cervical spine. 4. Redemonstrated soft tissue density lesions in the bilateral parotid glands with interval development of a new lesion in  the inferior aspect of the right parotid gland measuring 9 mm. These remain indeterminate, ENT referral for further workup and evaluation is recommended, if not previously performed. 5. Small right pleural effusion, new from prior exam. Electronically Signed   By: Lorenza Cambridge M.D.   On: 10/06/2023 08:27   DG Chest Port 1 View Result Date: 10/06/2023 CLINICAL DATA:  Larey Seat going to the bathroom. EXAM: PORTABLE CHEST 1 VIEW COMPARISON:  AP Lat chest 11/04/2022 FINDINGS: There are acute or at least recent displaced fractures of the lateral right sixth, seventh and eighth ribs, with adjacent airspace disease in the lateral right lower lung field which is probably a pulmonary contusion, and a small quantity of pleural fluid underlying. There is no measurable pneumothorax. On the left there is increased opacity in the lung bases which could be atelectasis or consolidation. This was not seen previously. The remaining lungs are clear. There is stable cardiomegaly without CHF. The mediastinal configuration is normal. There is calcification in the transverse aorta. There is osteopenia with thoracic spondylosis. No other displaced rib fractures. IMPRESSION: 1. Acute or at least recent displaced fractures of the lateral right sixth, seventh and eighth ribs, with adjacent airspace disease in the lateral right lower lung field which is probably a pulmonary contusion, and a small quantity of pleural fluid underlying. No measurable pneumothorax. 2. Increased opacity in the left lung base which could be atelectasis or consolidation. Low inspiration on exam. 3. Stable cardiomegaly. 4. Aortic atherosclerosis. Electronically Signed   By: Almira Bar M.D.   On: 10/06/2023 07:41    Radiological Exams on Admission: CT Chest Wo Contrast Result Date: 10/06/2023 CLINICAL DATA:  Pneumonia, complication suspected, xray done.  Fall. EXAM: CT CHEST WITHOUT CONTRAST TECHNIQUE: Multidetector CT imaging of the chest was performed  following the standard protocol without IV contrast. RADIATION DOSE REDUCTION: This exam was  performed according to the departmental dose-optimization program which includes automated exposure control, adjustment of the mA and/or kV according to patient size and/or use of iterative reconstruction technique. COMPARISON:  CT scan chest from 11/04/2022. FINDINGS: Cardiovascular: Mild cardiomegaly. No pericardial effusion. No aortic aneurysm. There are coronary artery calcifications, in keeping with coronary artery disease. There are also mild-to-moderate peripheral atherosclerotic vascular calcifications of thoracic aorta and its major branches. Mediastinum/Nodes: Visualized thyroid gland appears grossly unremarkable. No solid / cystic mediastinal masses. The esophagus is nondistended precluding optimal assessment. There are few mildly prominent mediastinal and hilar lymph nodes, which do not meet the size criteria for lymphadenopathy and appear grossly similar to the prior study, favoring benign etiology. No axillary lymphadenopathy by size criteria. Lungs/Pleura: The central tracheo-bronchial tree is patent. There is moderate right pleural effusion with associated compressive atelectatic changes in the right lower lobe. There are minimal emphysematous changes predominantly in the right upper lobe. There are also dependent changes throughout bilateral lungs. Previously noted 5 mm nodule in the right upper lobe is less conspicuous on today's exam. No new or suspicious lung nodule. No mass, consolidation or pneumothorax. No left pleural effusion. Upper Abdomen: There is a 2.6 x 3.4 cm hypoattenuating structure in the right hepatic dome, segment 8, favored to represent a cyst. There are hyperattenuating contents in the partially visualized gallbladder, which may represent concentrated bile versus sludge. There are scattered calcified granuloma in the spleen. There is dense calcification in the right adrenal gland, which  may represent sequela of prior hemorrhage or infection. Remaining visualized upper abdominal viscera within normal limits. Musculoskeletal: The visualized soft tissues of the chest wall are grossly unremarkable. No suspicious osseous lesions. There are mild to moderate multilevel degenerative changes in the visualized spine. There are multiple subacute/healing right-sided rib fractures involving anterior aspect of right third through eighth ribs and posteromedial aspect of right fourth, sixth and ninth ribs. The fracture margins are not sharp and there is surrounding callus formation. IMPRESSION: 1. There is moderate right pleural effusion with associated compressive atelectatic changes in the right lower lobe. Bilateral lungs are otherwise clear of mass, consolidation or pneumothorax. No left pleural effusion. No suspicious lung nodule. 2. There are subacute/healing fractures of right-sided third through ninth ribs, as described above. 3. Multiple other nonacute observations, as described above. Aortic Atherosclerosis (ICD10-I70.0) and Emphysema (ICD10-J43.9). Electronically Signed   By: Jules Schick M.D.   On: 10/06/2023 08:53   CT Head Wo Contrast Result Date: 10/06/2023 CLINICAL DATA:  Head trauma, minor (Age >= 65y); Neck trauma (Age >= 65y) EXAM: CT HEAD WITHOUT CONTRAST CT CERVICAL SPINE WITHOUT CONTRAST TECHNIQUE: Multidetector CT imaging of the head and cervical spine was performed following the standard protocol without intravenous contrast. Multiplanar CT image reconstructions of the cervical spine were also generated. RADIATION DOSE REDUCTION: This exam was performed according to the departmental dose-optimization program which includes automated exposure control, adjustment of the mA and/or kV according to patient size and/or use of iterative reconstruction technique. COMPARISON:  CT Head andCTA head/neck 02/14/23 FINDINGS: CT HEAD FINDINGS Brain: No hemorrhage. No hydrocephalus. No extra-axial  fluid collection. No CT evidence of an acute cortical infarct. There is background moderate to severe chronic microvascular ischemic change. 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 Vascular: No hyperdense vessel or unexpected calcification. Skull: Soft tissue swelling left frontal scalp. No evidence of underlying calvarial fracture. Sinuses/Orbits: No middle ear effusion. Small right mastoid effusion. Mild mucosal thickening in the right maxillary sinus. Bilateral lens  replacement. Orbits are otherwise unremarkable. Other: None. CT CERVICAL SPINE FINDINGS Limitations: Motion degraded exam Alignment: Straightening of the normal cervical lordosis. There is trace anterolisthesis of C2 on C3 that is new compared to 02/14/2023 Skull base and vertebrae: Incomplete fusion of the anterior arch of C1. Apparent cortical irregularity along the anterior and posterior aspect of the base of the dens and C2 vertebral body is favored to be artifactual and related to motion artifact. There is no evidence of prevertebral soft tissue swelling in this region. Soft tissues and spinal canal: Redemonstrated soft tissue density lesions in the bilateral parotid glands with interval development of a new lesion in the inferior aspect of the right parotid gland measuring 9 mm (series 8, image 41). These remain indeterminate, ENT referral for further workup and evaluation is recommended, if not previously performed. Disc levels: There is likely at least moderate spinal canal narrowing at C6-C7 Upper chest: Small right pleural effusion, new from prior exam Other: None IMPRESSION: 1. No CT evidence of intracranial injury. 2. Soft tissue swelling left frontal scalp. No evidence of underlying calvarial fracture. 3. Motion degraded exam of the cervical spine. Apparent cortical irregularity along the anterior and posterior aspects of the base of the dens and C2 vertebral body is favored to be artifactual and related to motion artifact. There is  no evidence of prevertebral soft tissue swelling in this region. If there is high clinical concern for fracture, consider a repeat CT of the cervical spine. 4. Redemonstrated soft tissue density lesions in the bilateral parotid glands with interval development of a new lesion in the inferior aspect of the right parotid gland measuring 9 mm. These remain indeterminate, ENT referral for further workup and evaluation is recommended, if not previously performed. 5. Small right pleural effusion, new from prior exam. Electronically Signed   By: Lorenza Cambridge M.D.   On: 10/06/2023 08:27   CT Cervical Spine Wo Contrast Result Date: 10/06/2023 CLINICAL DATA:  Head trauma, minor (Age >= 65y); Neck trauma (Age >= 65y) EXAM: CT HEAD WITHOUT CONTRAST CT CERVICAL SPINE WITHOUT CONTRAST TECHNIQUE: Multidetector CT imaging of the head and cervical spine was performed following the standard protocol without intravenous contrast. Multiplanar CT image reconstructions of the cervical spine were also generated. RADIATION DOSE REDUCTION: This exam was performed according to the departmental dose-optimization program which includes automated exposure control, adjustment of the mA and/or kV according to patient size and/or use of iterative reconstruction technique. COMPARISON:  CT Head andCTA head/neck 02/14/23 FINDINGS: CT HEAD FINDINGS Brain: No hemorrhage. No hydrocephalus. No extra-axial fluid collection. No CT evidence of an acute cortical infarct. There is background moderate to severe chronic microvascular ischemic change. 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 Vascular: No hyperdense vessel or unexpected calcification. Skull: Soft tissue swelling left frontal scalp. No evidence of underlying calvarial fracture. Sinuses/Orbits: No middle ear effusion. Small right mastoid effusion. Mild mucosal thickening in the right maxillary sinus. Bilateral lens replacement. Orbits are otherwise unremarkable. Other: None. CT CERVICAL  SPINE FINDINGS Limitations: Motion degraded exam Alignment: Straightening of the normal cervical lordosis. There is trace anterolisthesis of C2 on C3 that is new compared to 02/14/2023 Skull base and vertebrae: Incomplete fusion of the anterior arch of C1. Apparent cortical irregularity along the anterior and posterior aspect of the base of the dens and C2 vertebral body is favored to be artifactual and related to motion artifact. There is no evidence of prevertebral soft tissue swelling in this region. Soft tissues and spinal  canal: Redemonstrated soft tissue density lesions in the bilateral parotid glands with interval development of a new lesion in the inferior aspect of the right parotid gland measuring 9 mm (series 8, image 41). These remain indeterminate, ENT referral for further workup and evaluation is recommended, if not previously performed. Disc levels: There is likely at least moderate spinal canal narrowing at C6-C7 Upper chest: Small right pleural effusion, new from prior exam Other: None IMPRESSION: 1. No CT evidence of intracranial injury. 2. Soft tissue swelling left frontal scalp. No evidence of underlying calvarial fracture. 3. Motion degraded exam of the cervical spine. Apparent cortical irregularity along the anterior and posterior aspects of the base of the dens and C2 vertebral body is favored to be artifactual and related to motion artifact. There is no evidence of prevertebral soft tissue swelling in this region. If there is high clinical concern for fracture, consider a repeat CT of the cervical spine. 4. Redemonstrated soft tissue density lesions in the bilateral parotid glands with interval development of a new lesion in the inferior aspect of the right parotid gland measuring 9 mm. These remain indeterminate, ENT referral for further workup and evaluation is recommended, if not previously performed. 5. Small right pleural effusion, new from prior exam. Electronically Signed   By: Lorenza Cambridge M.D.   On: 10/06/2023 08:27   DG Chest Port 1 View Result Date: 10/06/2023 CLINICAL DATA:  Larey Seat going to the bathroom. EXAM: PORTABLE CHEST 1 VIEW COMPARISON:  AP Lat chest 11/04/2022 FINDINGS: There are acute or at least recent displaced fractures of the lateral right sixth, seventh and eighth ribs, with adjacent airspace disease in the lateral right lower lung field which is probably a pulmonary contusion, and a small quantity of pleural fluid underlying. There is no measurable pneumothorax. On the left there is increased opacity in the lung bases which could be atelectasis or consolidation. This was not seen previously. The remaining lungs are clear. There is stable cardiomegaly without CHF. The mediastinal configuration is normal. There is calcification in the transverse aorta. There is osteopenia with thoracic spondylosis. No other displaced rib fractures. IMPRESSION: 1. Acute or at least recent displaced fractures of the lateral right sixth, seventh and eighth ribs, with adjacent airspace disease in the lateral right lower lung field which is probably a pulmonary contusion, and a small quantity of pleural fluid underlying. No measurable pneumothorax. 2. Increased opacity in the left lung base which could be atelectasis or consolidation. Low inspiration on exam. 3. Stable cardiomegaly. 4. Aortic atherosclerosis. Electronically Signed   By: Almira Bar M.D.   On: 10/06/2023 07:41    DVT Prophylaxis -SCD/ AM Labs Ordered, also please review Full Orders  Family Communication: Admission, patients condition and plan of care including tests being ordered have been discussed with the patient and daughter Gerald Hall who indicate understanding and agree with the plan   Condition  stable  Shon Hale M.D on 10/06/2023 at 5:59 PM Go to [email or web address] -  for contact info  [facility] - Office  [phone]

## 2023-10-06 NOTE — ED Provider Notes (Signed)
[place] EMERGENCY DEPARTMENT AT [facility] Provider Note   CSN: [id number] Arrival date & time: 10/06/23  9147     History  Chief Complaint  Patient presents with   Deliah Goody EXZAVIOR KUNIS is a 88 y.o. male.  He is brought in by EMS from home after unwitnessed fall.  He said he fell around 2 AM and could not get back up over the course of 4 hours.  Could not wake up his caregiver.  He denies any complaints.  He said his shortness of breath is at baseline.  Uses oxygen at times.  No headache chest pain.  No abdominal pain.  The history is provided by the patient and the EMS personnel.  Fall This is a recurrent problem. The current episode started 6 to 12 hours ago. The problem has not changed since onset.Pertinent negatives include no chest pain, no abdominal pain, no headaches and no shortness of breath. Nothing aggravates the symptoms. Nothing relieves the symptoms. He has tried nothing for the symptoms. The treatment provided no relief.       Home Medications Prior to Admission medications   Medication Sig Start Date End Date Taking? Authorizing Provider  acetaminophen (TYLENOL) 500 MG tablet Take 1,000 mg by mouth every 6 (six) hours as needed for mild pain or headache.    [provider]  albuterol (VENTOLIN HFA) 108 (90 Base) MCG/ACT inhaler Inhale 2 puffs into the lungs every 4 (four) hours as needed for wheezing. 09/01/22   Babs Sciara, MD  amiodarone (PACERONE) 100 MG tablet Take 1 tablet (100 mg total) by mouth daily. 09/30/23   Jonelle Sidle, MD  aspirin EC 81 MG tablet Take 1 tablet (81 mg total) by mouth 2 (two) times daily. Swallow whole. 08/03/23   Babs Sciara, MD  budesonide-formoterol (SYMBICORT) 160-4.5 MCG/ACT inhaler Inhale 2 puffs into the lungs 2 (two) times daily. 08/27/22   Babs Sciara, MD  calcium carbonate (OS-CAL) 600 MG TABS tablet Take 600 mg by mouth daily with breakfast.    [provider]  carvedilol (COREG)  3.125 MG tablet TAKE ONE TABLET BY MOUTH TWICE DAILY 08/26/23   Babs Sciara, MD  diclofenac Sodium (VOLTAREN) 1 % GEL Apply 2 g topically. 04/05/23 04/04/24  [provider]  doxycycline (VIBRA-TABS) 100 MG tablet Take 1 tablet (100 mg total) by mouth 2 (two) times daily. 09/30/23   Tommie Sams, DO  feeding supplement, GLUCERNA SHAKE, (GLUCERNA SHAKE) LIQD Take 237 mLs by mouth 3 (three) times daily between meals. 02/18/23   Narda Bonds, MD  finasteride (PROSCAR) 5 MG tablet TAKE ONE TABLET BY MOUTH EVERY DAY 08/26/23   Babs Sciara, MD  fluticasone (FLONASE) 50 MCG/ACT nasal spray Place 2 sprays into both nostrils at bedtime. 08/27/22   Babs Sciara, MD  GNP VITAMIN C 500 MG tablet TAKE ONE TABLET BY MOUTH EVERY MORNING 09/20/23   Luking, Jonna Coup, MD  GNP VITAMIN D3 EXTRA STRENGTH 25 MCG (1000 UT) tablet TAKE ONE TABLET BY MOUTH EVERY MORNING 08/01/23   Luking, Scott A, MD  ipratropium-albuterol (DUONEB) 0.5-2.5 (3) MG/3ML SOLN Take 3 mLs by nebulization every 4 (four) hours as needed. 08/27/22   Luking, Jonna Coup, MD  JARDIANCE 10 MG TABS tablet TAKE ONE TABLET BY MOUTH ONCE DAILY BEFORE BREAKFAST Patient taking differently: Take 10 mg by mouth daily. 11/17/22   Jonelle Sidle, MD  methimazole (TAPAZOLE) 5 MG tablet TAKE  ONE TABLET BY MOUTH EVERY DAY 08/31/23   Babs Sciara, MD  OXYGEN Inhale 2 L into the lungs continuous.    [provider]  oxymetazoline (AFRIN) 0.05 % nasal spray Place 1 spray into both nostrils at bedtime.    [provider]  polyethylene glycol (MIRALAX / GLYCOLAX) 17 g packet Take 17 g by mouth daily as needed for mild constipation.    [provider]  polyvinyl alcohol (LIQUIFILM TEARS) 1.4 % ophthalmic solution Place 1 drop into both eyes as needed for dry eyes.    [provider]  potassium chloride (KLOR-CON) 10 MEQ tablet TAKE TWO TABLETS BY MOUTH EVERY DAY 07/06/23   Babs Sciara, MD  rOPINIRole (REQUIP) 2 MG  tablet TAKE ONE TABLET BY MOUTH TWICE DAILY IN THE AFTERNOON AND AT BEDTIME 08/31/23   Luking, Jonna Coup, MD  tamsulosin (FLOMAX) 0.4 MG CAPS capsule TAKE (1) CAPSULE BY MOUTH TWICE DAILY. 06/02/23   Babs Sciara, MD  torsemide (DEMADEX) 20 MG tablet 1/2 tablet daily in the morning 09/05/23   Babs Sciara, MD  traMADol (ULTRAM) 50 MG tablet TAKE ONE TABLET BY MOUTH THREE TIMES DAILY AS NEEDED FOR PAIN 09/12/23   Babs Sciara, MD  triamcinolone cream (KENALOG) 0.1 % Apply thin amount two times daily 09/20/23   Babs Sciara, MD  Zinc Acetate, Oral, (ZINC ACETATE PO) Take 1 tablet by mouth daily.    [provider]  ZINC PLUS VITAMIN C 50-90 MG CAPS TAKE ONE TABLET BY MOUTH EVERY MORNING 08/26/23   Babs Sciara, MD      Allergies    Cephalexin and Penicillins    Review of Systems   Review of Systems  Constitutional:  Negative for fever.  Respiratory:  Negative for shortness of breath.   Cardiovascular:  Negative for chest pain.  Gastrointestinal:  Negative for abdominal pain.  Skin:  Positive for wound.  Neurological:  Negative for headaches.    Physical Exam Updated Vital Signs BP (!) 155/86   Pulse 100   Temp 98.3 F (36.8 C)   Ht 5\' 8"  (1.727 m)   Wt 86.6 kg   SpO2 92%   BMI 29.04 kg/m  Physical Exam Vitals and nursing note reviewed.  Constitutional:      General: He is not in acute distress.    Appearance: Normal appearance. He is well-developed.  HENT:     Head: Normocephalic.     Comments: Patient has a large skin tear on top of his forehead without active bleeding Eyes:     Conjunctiva/sclera: Conjunctivae normal.  Cardiovascular:     Rate and Rhythm: Normal rate and regular rhythm.     Heart sounds: No murmur heard. Pulmonary:     Effort: Tachypnea and accessory muscle usage present.     Breath sounds: Rhonchi present.  Abdominal:     Palpations: Abdomen is soft.     Tenderness: There is no abdominal tenderness. There is no guarding or  rebound.  Musculoskeletal:        General: No tenderness. Normal range of motion.     Cervical back: Neck supple.     Right lower leg: Edema present.     Left lower leg: Edema present.  Skin:    General: Skin is warm and dry.     Capillary Refill: Capillary refill takes less than 2 seconds.  Neurological:     General: No focal deficit present.     Mental Status:  He is alert.     Motor: No weakness.     ED Results / Procedures / Treatments   Labs (all labs ordered are listed, but only abnormal results are displayed) Labs Reviewed  BASIC METABOLIC PANEL - Abnormal; Notable for the following components:      Result Value   BUN 58 (*)    Creatinine, Ser 1.85 (*)    GFR, Estimated 35 (*)    All other components within normal limits  CBC WITH DIFFERENTIAL/PLATELET - Abnormal; Notable for the following components:   WBC 13.1 (*)    RDW 16.4 (*)    Neutro Abs 9.5 (*)    Monocytes Absolute 1.7 (*)    Abs Immature Granulocytes 0.11 (*)    All other components within normal limits  BRAIN NATRIURETIC PEPTIDE - Abnormal; Notable for the following components:   B Natriuretic Peptide 1,445.0 (*)    All other components within normal limits  BASIC METABOLIC PANEL - Abnormal; Notable for the following components:   BUN 51 (*)    Creatinine, Ser 1.71 (*)    GFR, Estimated 39 (*)    All other components within normal limits  CBC - Abnormal; Notable for the following components:   WBC 11.0 (*)    Hemoglobin 12.9 (*)    RDW 16.4 (*)    All other components within normal limits  TROPONIN I (HIGH SENSITIVITY) - Abnormal; Notable for the following components:   Troponin I (High Sensitivity) 28 (*)    All other components within normal limits  TROPONIN I (HIGH SENSITIVITY) - Abnormal; Notable for the following components:   Troponin I (High Sensitivity) 27 (*)    All other components within normal limits  CK    EKG EKG Interpretation Date/Time:  Thursday October 06 2023 08:09:11  EST Ventricular Rate:  84 PR Interval:  32 QRS Duration:  151 QT Interval:  446 QTC Calculation: 528 R Axis:   112  Text Interpretation: Sinus or ectopic atrial rhythm Short PR interval Nonspecific intraventricular conduction delay Borderline ST depression, diffuse leads No significant change since prior 4/24 Confirmed by Meridee Score [phone]) on 10/06/2023 8:23:42 AM  Radiology CT Chest Wo Contrast Result Date: 10/06/2023 CLINICAL DATA:  Pneumonia, complication suspected, xray done.  Fall. EXAM: CT CHEST WITHOUT CONTRAST TECHNIQUE: Multidetector CT imaging of the chest was performed following the standard protocol without IV contrast. RADIATION DOSE REDUCTION: This exam was performed according to the departmental dose-optimization program which includes automated exposure control, adjustment of the mA and/or kV according to patient size and/or use of iterative reconstruction technique. COMPARISON:  CT scan chest from 11/04/2022. FINDINGS: Cardiovascular: Mild cardiomegaly. No pericardial effusion. No aortic aneurysm. There are coronary artery calcifications, in keeping with coronary artery disease. There are also mild-to-moderate peripheral atherosclerotic vascular calcifications of thoracic aorta and its major branches. Mediastinum/Nodes: Visualized thyroid gland appears grossly unremarkable. No solid / cystic mediastinal masses. The esophagus is nondistended precluding optimal assessment. There are few mildly prominent mediastinal and hilar lymph nodes, which do not meet the size criteria for lymphadenopathy and appear grossly similar to the prior study, favoring benign etiology. No axillary lymphadenopathy by size criteria. Lungs/Pleura: The central tracheo-bronchial tree is patent. There is moderate right pleural effusion with associated compressive atelectatic changes in the right lower lobe. There are minimal emphysematous changes predominantly in the right upper lobe. There are also dependent  changes throughout bilateral lungs. Previously noted 5 mm nodule in the right upper lobe is less conspicuous on  today's exam. No new or suspicious lung nodule. No mass, consolidation or pneumothorax. No left pleural effusion. Upper Abdomen: There is a 2.6 x 3.4 cm hypoattenuating structure in the right hepatic dome, segment 8, favored to represent a cyst. There are hyperattenuating contents in the partially visualized gallbladder, which may represent concentrated bile versus sludge. There are scattered calcified granuloma in the spleen. There is dense calcification in the right adrenal gland, which may represent sequela of prior hemorrhage or infection. Remaining visualized upper abdominal viscera within normal limits. Musculoskeletal: The visualized soft tissues of the chest wall are grossly unremarkable. No suspicious osseous lesions. There are mild to moderate multilevel degenerative changes in the visualized spine. There are multiple subacute/healing right-sided rib fractures involving anterior aspect of right third through eighth ribs and posteromedial aspect of right fourth, sixth and ninth ribs. The fracture margins are not sharp and there is surrounding callus formation. IMPRESSION: 1. There is moderate right pleural effusion with associated compressive atelectatic changes in the right lower lobe. Bilateral lungs are otherwise clear of mass, consolidation or pneumothorax. No left pleural effusion. No suspicious lung nodule. 2. There are subacute/healing fractures of right-sided third through ninth ribs, as described above. 3. Multiple other nonacute observations, as described above. Aortic Atherosclerosis (ICD10-I70.0) and Emphysema (ICD10-J43.9). Electronically Signed   By: Jules Schick M.D.   On: 10/06/2023 08:53   CT Head Wo Contrast Result Date: 10/06/2023 CLINICAL DATA:  Head trauma, minor (Age >= 65y); Neck trauma (Age >= 65y) EXAM: CT HEAD WITHOUT CONTRAST CT CERVICAL SPINE WITHOUT CONTRAST  TECHNIQUE: Multidetector CT imaging of the head and cervical spine was performed following the standard protocol without intravenous contrast. Multiplanar CT image reconstructions of the cervical spine were also generated. RADIATION DOSE REDUCTION: This exam was performed according to the departmental dose-optimization program which includes automated exposure control, adjustment of the mA and/or kV according to patient size and/or use of iterative reconstruction technique. COMPARISON:  CT Head andCTA head/neck 02/14/23 FINDINGS: CT HEAD FINDINGS Brain: No hemorrhage. No hydrocephalus. No extra-axial fluid collection. No CT evidence of an acute cortical infarct. There is background moderate to severe chronic microvascular ischemic change. 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 Vascular: No hyperdense vessel or unexpected calcification. Skull: Soft tissue swelling left frontal scalp. No evidence of underlying calvarial fracture. Sinuses/Orbits: No middle ear effusion. Small right mastoid effusion. Mild mucosal thickening in the right maxillary sinus. Bilateral lens replacement. Orbits are otherwise unremarkable. Other: None. CT CERVICAL SPINE FINDINGS Limitations: Motion degraded exam Alignment: Straightening of the normal cervical lordosis. There is trace anterolisthesis of C2 on C3 that is new compared to 02/14/2023 Skull base and vertebrae: Incomplete fusion of the anterior arch of C1. Apparent cortical irregularity along the anterior and posterior aspect of the base of the dens and C2 vertebral body is favored to be artifactual and related to motion artifact. There is no evidence of prevertebral soft tissue swelling in this region. Soft tissues and spinal canal: Redemonstrated soft tissue density lesions in the bilateral parotid glands with interval development of a new lesion in the inferior aspect of the right parotid gland measuring 9 mm (series 8, image 41). These remain indeterminate, ENT referral for  further workup and evaluation is recommended, if not previously performed. Disc levels: There is likely at least moderate spinal canal narrowing at C6-C7 Upper chest: Small right pleural effusion, new from prior exam Other: None IMPRESSION: 1. No CT evidence of intracranial injury. 2. Soft tissue swelling left frontal scalp.  No evidence of underlying calvarial fracture. 3. Motion degraded exam of the cervical spine. Apparent cortical irregularity along the anterior and posterior aspects of the base of the dens and C2 vertebral body is favored to be artifactual and related to motion artifact. There is no evidence of prevertebral soft tissue swelling in this region. If there is high clinical concern for fracture, consider a repeat CT of the cervical spine. 4. Redemonstrated soft tissue density lesions in the bilateral parotid glands with interval development of a new lesion in the inferior aspect of the right parotid gland measuring 9 mm. These remain indeterminate, ENT referral for further workup and evaluation is recommended, if not previously performed. 5. Small right pleural effusion, new from prior exam. Electronically Signed   By: Lorenza Cambridge M.D.   On: 10/06/2023 08:27   CT Cervical Spine Wo Contrast Result Date: 10/06/2023 CLINICAL DATA:  Head trauma, minor (Age >= 65y); Neck trauma (Age >= 65y) EXAM: CT HEAD WITHOUT CONTRAST CT CERVICAL SPINE WITHOUT CONTRAST TECHNIQUE: Multidetector CT imaging of the head and cervical spine was performed following the standard protocol without intravenous contrast. Multiplanar CT image reconstructions of the cervical spine were also generated. RADIATION DOSE REDUCTION: This exam was performed according to the departmental dose-optimization program which includes automated exposure control, adjustment of the mA and/or kV according to patient size and/or use of iterative reconstruction technique. COMPARISON:  CT Head andCTA head/neck 02/14/23 FINDINGS: CT HEAD FINDINGS  Brain: No hemorrhage. No hydrocephalus. No extra-axial fluid collection. No CT evidence of an acute cortical infarct. There is background moderate to severe chronic microvascular ischemic change. 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 Vascular: No hyperdense vessel or unexpected calcification. Skull: Soft tissue swelling left frontal scalp. No evidence of underlying calvarial fracture. Sinuses/Orbits: No middle ear effusion. Small right mastoid effusion. Mild mucosal thickening in the right maxillary sinus. Bilateral lens replacement. Orbits are otherwise unremarkable. Other: None. CT CERVICAL SPINE FINDINGS Limitations: Motion degraded exam Alignment: Straightening of the normal cervical lordosis. There is trace anterolisthesis of C2 on C3 that is new compared to 02/14/2023 Skull base and vertebrae: Incomplete fusion of the anterior arch of C1. Apparent cortical irregularity along the anterior and posterior aspect of the base of the dens and C2 vertebral body is favored to be artifactual and related to motion artifact. There is no evidence of prevertebral soft tissue swelling in this region. Soft tissues and spinal canal: Redemonstrated soft tissue density lesions in the bilateral parotid glands with interval development of a new lesion in the inferior aspect of the right parotid gland measuring 9 mm (series 8, image 41). These remain indeterminate, ENT referral for further workup and evaluation is recommended, if not previously performed. Disc levels: There is likely at least moderate spinal canal narrowing at C6-C7 Upper chest: Small right pleural effusion, new from prior exam Other: None IMPRESSION: 1. No CT evidence of intracranial injury. 2. Soft tissue swelling left frontal scalp. No evidence of underlying calvarial fracture. 3. Motion degraded exam of the cervical spine. Apparent cortical irregularity along the anterior and posterior aspects of the base of the dens and C2 vertebral body is favored to be  artifactual and related to motion artifact. There is no evidence of prevertebral soft tissue swelling in this region. If there is high clinical concern for fracture, consider a repeat CT of the cervical spine. 4. Redemonstrated soft tissue density lesions in the bilateral parotid glands with interval development of a new lesion in the inferior aspect  of the right parotid gland measuring 9 mm. These remain indeterminate, ENT referral for further workup and evaluation is recommended, if not previously performed. 5. Small right pleural effusion, new from prior exam. Electronically Signed   By: Lorenza Cambridge M.D.   On: 10/06/2023 08:27   DG Chest Port 1 View Result Date: 10/06/2023 CLINICAL DATA:  Larey Seat going to the bathroom. EXAM: PORTABLE CHEST 1 VIEW COMPARISON:  AP Lat chest 11/04/2022 FINDINGS: There are acute or at least recent displaced fractures of the lateral right sixth, seventh and eighth ribs, with adjacent airspace disease in the lateral right lower lung field which is probably a pulmonary contusion, and a small quantity of pleural fluid underlying. There is no measurable pneumothorax. On the left there is increased opacity in the lung bases which could be atelectasis or consolidation. This was not seen previously. The remaining lungs are clear. There is stable cardiomegaly without CHF. The mediastinal configuration is normal. There is calcification in the transverse aorta. There is osteopenia with thoracic spondylosis. No other displaced rib fractures. IMPRESSION: 1. Acute or at least recent displaced fractures of the lateral right sixth, seventh and eighth ribs, with adjacent airspace disease in the lateral right lower lung field which is probably a pulmonary contusion, and a small quantity of pleural fluid underlying. No measurable pneumothorax. 2. Increased opacity in the left lung base which could be atelectasis or consolidation. Low inspiration on exam. 3. Stable cardiomegaly. 4. Aortic  atherosclerosis. Electronically Signed   By: Almira Bar M.D.   On: 10/06/2023 07:41    Procedures Procedures    Medications Ordered in ED Medications  0.9 %  sodium chloride infusion ( Intravenous Rate/Dose Change 10/06/23 1428)  furosemide (LASIX) injection 20 mg (20 mg Intravenous Given 10/07/23 0500)  aspirin EC tablet 81 mg (81 mg Oral Given 10/06/23 2105)  traMADol (ULTRAM) tablet 50 mg (50 mg Oral Given 10/07/23 0637)  amiodarone (PACERONE) tablet 100 mg (has no administration in time range)  carvedilol (COREG) tablet 3.125 mg (3.125 mg Oral Given 10/06/23 1724)  methimazole (TAPAZOLE) tablet 5 mg (5 mg Oral Given 10/06/23 1724)  finasteride (PROSCAR) tablet 5 mg (5 mg Oral Given 10/06/23 1724)  tamsulosin (FLOMAX) capsule 0.4 mg (0.4 mg Oral Given 10/06/23 1724)  rOPINIRole (REQUIP) tablet 0.25 mg (0.25 mg Oral Given 10/06/23 2105)  mometasone-formoterol (DULERA) 200-5 MCG/ACT inhaler 2 puff (2 puffs Inhalation Given 10/06/23 2058)  polyvinyl alcohol (LIQUIFILM TEARS) 1.4 % ophthalmic solution 1 drop (has no administration in time range)  sodium chloride flush (NS) 0.9 % injection 3 mL (3 mLs Intravenous Not Given 10/06/23 2111)  sodium chloride flush (NS) 0.9 % injection 3 mL (3 mLs Intravenous Not Given 10/06/23 2111)  sodium chloride flush (NS) 0.9 % injection 3 mL (has no administration in time range)  0.9 %  sodium chloride infusion (has no administration in time range)  acetaminophen (TYLENOL) tablet 650 mg (650 mg Oral Given 10/06/23 2105)    Or  acetaminophen (TYLENOL) suppository 650 mg ( Rectal See Alternative 10/06/23 2105)  traZODone (DESYREL) tablet 50 mg (50 mg Oral Given 10/06/23 2105)  polyethylene glycol (MIRALAX / GLYCOLAX) packet 17 g (has no administration in time range)  bisacodyl (DULCOLAX) suppository 10 mg (has no administration in time range)  ondansetron (ZOFRAN) tablet 4 mg (has no administration in time range)    Or  ondansetron (ZOFRAN)  injection 4 mg (has no administration in time range)  heparin injection 5,000 Units (5,000 Units Subcutaneous Given 10/07/23  [phone])  hydrocortisone cream 1 % (has no administration in time range)  Tdap (BOOSTRIX) injection 0.5 mL (0.5 mLs Intramuscular Given 10/06/23 0727)  furosemide (LASIX) injection 20 mg (20 mg Intravenous Given 10/06/23 1348)    ED Course/ Medical Decision Making/ A&P Clinical Course as of 10/07/23 0738  Thu Oct 06, 2023  2130 Chest x-ray interpreted by me as cardiomegaly poor inspiratory effort likely some element of fluid overload.  Awaiting radiology reading. [MB]  1149 Patient's caregiver said his breathing has been worse.  I talked with the daughter over the phone and she was hoping we could admit him.  Patient is not very happy about staying. [MB]    Clinical Course User Index [MB] Terrilee Files, MD                                 Medical Decision Making Amount and/or Complexity of Data Reviewed Labs: ordered. Radiology: ordered.  Risk Prescription drug management. Decision regarding hospitalization.   This patient complains of fall general weakness; this involves an extensive number of treatment Options and is a complaint that carries with it a high risk of complications and morbidity. The differential includes fall, head injury, bleed, fracture, respiratory distress, hypoxia, metabolic derangement, ACS  I ordered, reviewed and interpreted labs, which included CBC with elevated white count, chemistries with chronic CKD slightly worse, troponins mildly elevated need to be trended, CK normal, BNP elevated I ordered medication IV Lasix, tetanus update and reviewed PMP when indicated. I ordered imaging studies which included CT head cervical spine, chest x-ray, CT chest and I independently    visualized and interpreted imaging which showed multiple subacute rib fractures, moderate right pleural effusion Additional history obtained from patient's  caregiver and EMS Previous records obtained and reviewed in epic including recent PCP and ED visits I consulted [facility] hospitalist Dr. Mariea Clonts and discussed lab and imaging findings and discussed disposition.  Cardiac monitoring reviewed, atrial fibrillation Social determinants considered, patient with increased stress, physically inactive Critical Interventions: None  After the interventions stated above, I reevaluated the patient and found patient to be requiring oxygen and showing some signs of congestive heart failure Admission and further testing considered, patient would benefit from admission to the hospital for IV diuresis and physical therapy evaluation         Final Clinical Impression(s) / ED Diagnoses Final diagnoses:  Fall, initial encounter  Injury of head, initial encounter  Parotid mass  Acute on chronic congestive heart failure, unspecified heart failure type [facility])    Rx / DC Orders ED Discharge Orders     None         Terrilee Files, MD 10/07/23 [phone]

## 2023-10-06 NOTE — ED Notes (Signed)
Pt and caregiver state that this pt does not ambulate.  Has been in therapy using a wheelchair d/t prior fall and broken hip.  Placed back in bed at this time, call bell within reach.  RN notified.

## 2023-10-06 NOTE — ED Notes (Addendum)
Patient transported to CT 

## 2023-10-06 NOTE — Evaluation (Signed)
Physical Therapy Evaluation Patient Details Name: Gerald Hall MRN: [id number] DOB: [birth year] Today's Date: 10/06/2023  History of Present Illness  He lost his balance and fell at home suffering a skin tear to his forehead.  He denies loss of consciousness.  He was unable to wake his caregiver up and lay on the floor for several hours.  He denies other injury  Clinical Impression  Spoke with caregiver who states that they do not want pt to go to SNF, he is already receiving HH one time a week and a friend comes once a week and works with the patient.  Pt is wheelchair bound and is not able to assist with transfers at this time per caregiver but they are working on it.  PT is very lethargic at this time.  Caregiver states that he has not slept in two days.          If plan is discharge home, recommend the following: Assistance with cooking/housework;Assist for transportation;A lot of help with walking and/or transfers;A little help with bathing/dressing/bathroom;Help with stairs or ramp for entrance   Can travel by private vehicle        Equipment Recommendations None recommended by PT  Recommendations for Other Services       Functional Status Assessment Patient has had a recent decline in their functional status and demonstrates the ability to make significant improvements in function in a reasonable and predictable amount of time.     Precautions / Restrictions Precautions Precautions: Fall Restrictions Weight Bearing Restrictions Per Provider Order: No      Mobility  Bed Mobility Overal bed mobility: Needs Assistance             General bed mobility comments: mod to max assist    Transfers Overall transfer level: Needs assistance Equipment used: None Transfers: Sit to/from Stand Sit to Stand: Mod assist, Max assist                Ambulation/Gait: prior level wheelchair bound.                         Pertinent Vitals/Pain Pain  Assessment Pain Assessment: No/denies pain    Home Living Family/patient expects to be discharged to:: Private residence Living Arrangements: Children Available Help at Discharge: Personal care attendant;Available 24 hours/day Type of Home: House Home Access: Ramped entrance       Home Layout: One level Home Equipment: Agricultural consultant (2 wheels);Rollator (4 wheels);Shower seat;BSC/3in1;Cane - single point;Other (comment)      Prior Function Prior Level of Function : Needs assist       Physical Assist : Mobility (physical);ADLs (physical) Mobility (physical): Transfers;Gait;Stairs;Bed mobility ADLs (physical): Grooming;Bathing;Toileting;Dressing;IADLs Mobility Comments: Pt is wheelchair bound per care taker.  States he tries to help but basically falls into the wheelchair.  Pt is recieving HH therapy 1x per week.       Extremity/Trunk Assessment        Lower Extremity Assessment Lower Extremity Assessment: Generalized weakness       Communication   Communication Communication: Hearing impairment  Cognition Arousal: Lethargic Behavior During Therapy: WFL for tasks assessed/performed Overall Cognitive Status: Within Functional Limits for tasks assessed  Assessment/Plan    PT Assessment Patient needs continued PT services  PT Problem List Decreased strength;Decreased activity tolerance;Decreased balance;Decreased mobility;Obesity;Decreased range of motion       PT Treatment Interventions Functional mobility training;Therapeutic activities;Therapeutic exercise;Balance training;Patient/family education    PT Goals (Current goals can be found in the Care Plan section)  Acute Rehab PT Goals Patient Stated Goal: To go home PT Goal Formulation: With family Time For Goal Achievement: 10/07/23 Potential to Achieve Goals: Good    Frequency Min 2X/week        AM-PAC PT "6 Clicks" Mobility   Outcome Measure Help needed turning from your back to your side while in a flat bed without using bedrails?: A Lot Help needed moving from lying on your back to sitting on the side of a flat bed without using bedrails?: A Lot Help needed moving to and from a bed to a chair (including a wheelchair)?: A Lot Help needed standing up from a chair using your arms (e.g., wheelchair or bedside chair)?: A Lot Help needed to walk in hospital room?: A Lot Help needed climbing 3-5 steps with a railing? : Total 6 Click Score: 11    End of Session Equipment Utilized During Treatment: Gait belt Activity Tolerance: Patient limited by fatigue Patient left: in bed   PT Visit Diagnosis: Unsteadiness on feet (R26.81);Muscle weakness (generalized) (M62.81);History of falling (Z91.81);Difficulty in walking, not elsewhere classified (R26.2)    Time: 1355-1416 PT Time Calculation (min) (ACUTE ONLY): 21 min   Charges:   PT Evaluation $PT Eval Low Complexity: 1 Low   PT General Charges $$ ACUTE PT VISIT: 1 Visit         Virgina Organ, PT CLT [phone]  10/06/2023, 2:16 PM

## 2023-10-06 NOTE — ED Triage Notes (Signed)
Patient from home for fall. Patient reports he got up around 0300 to go to the restroom when he lost his balance; denies dizziness. Patient hit his head on a coffee table; patient denies loss of consciousness. Upon arrival to ER, patient is alert and oriented, has an abrasion to the top of his head and to the R side of his nose under his eyeglasses. Patient ambulates with walker at baseline; wears 2L O2 at baseline

## 2023-10-06 NOTE — Progress Notes (Signed)
   10/06/23 1724  Vitals  Temp 98.5 F (36.9 C)  BP 122/77  MAP (mmHg) 92  Pulse Rate 80  Resp 18  MEWS COLOR  MEWS Score Color Green  Oxygen Therapy  SpO2 92 %  O2 Device Nasal Cannula  O2 Flow Rate (L/min) 4 L/min  MEWS Score  MEWS Temp 0  MEWS Systolic 0  MEWS Pulse 0  MEWS RR 0  MEWS LOC 0  MEWS Score 0   MD Courage aware that o2 was increased to 4L

## 2023-10-06 NOTE — ED Notes (Signed)
Changed patients bedding, clothes with assistance of other techs and then obtained orthostatic vital signs. Cleaned his wound and blood from his head. Dressed his head with a non-stick bandage.

## 2023-10-06 NOTE — ED Notes (Addendum)
ED TO INPATIENT HANDOFF REPORT  ED Nurse Name and Phone #: Beverley Fiedler Name/Age/Gender Gerald Hall 88 y.o. male Room/Bed: APA18/APA18  Code Status   Code Status: Prior  Home/SNF/Other Home Patient oriented to: self, place, time, and situation Is this baseline? Yes   Triage Complete: Triage complete  Chief Complaint Falls [R29.6]  Triage Note Patient from home for fall. Patient reports he got up around 0300 to go to the restroom when he lost his balance; denies dizziness. Patient hit his head on a coffee table; patient denies loss of consciousness. Upon arrival to ER, patient is alert and oriented, has an abrasion to the top of his head and to the R side of his nose under his eyeglasses. Patient ambulates with walker at baseline; wears 2L O2 at baseline   Allergies Allergies  Allergen Reactions   Cephalexin Diarrhea    And general weakness   Penicillins Other (See Comments)    GI UPSET    Level of Care/Admitting Diagnosis ED Disposition     ED Disposition  Admit   Condition  --   Comment  Hospital Area: [facility] [[id number]]  Level of Care: Telemetry [5]  Covid Evaluation: Asymptomatic - no recent exposure (last 10 days) testing not required  Diagnosis: Falls [[id number]]  Admitting Physician: Marylyn Ishihara  Attending Physician: Marylyn Ishihara          B Medical/Surgery History Past Medical History:  Diagnosis Date   Aortic atherosclerosis (HCC) 06/23/2022   Seen on CAT scan   Arthritis    Balance problem    Uses a walker   Blood in urine    Sees urology yearly   Chronic HFrEF (heart failure with reduced ejection fraction) (HCC)    Chronic kidney disease, stage 3 (HCC)    Coronary artery calcification seen on CT scan    Deafness in right ear age 36   Hyperlipidemia    Hypertension    Intractable nausea and vomiting 02/04/2023   Prolonged QT interval    PSVT (paroxysmal supraventricular tachycardia) (HCC)     Temporal arteritis (HCC) 2016   Urinary incontinence    Past Surgical History:  Procedure Laterality Date   BACK SURGERY  2010   [facility] neurosurgical clinic L4-L5 decompressive laminectomy   BELPHAROPTOSIS REPAIR     CATARACT EXTRACTION Bilateral 07/30/14, 08/13/2014   CERVICAL SPINE SURGERY  09/01/2015   C 4-5 , [facility] , [place]   COLONOSCOPY  12/19/2007   RMR: 1. External hemorrohids, otherwise normal rectum.2. Normal colon. 3. Normal terminal ileum.   COLONOSCOPY N/A 12/13/2014   Procedure: COLONOSCOPY;  Surgeon: Corbin Ade, MD;  Location: [facility];  Service: Endoscopy;  Laterality: N/A;  11:15 Pt Request Time   HIP ARTHROPLASTY Left 01/27/2023   Procedure: ARTHROPLASTY BIPOLAR HIP (HEMIARTHROPLASTY);  Surgeon: Oliver Barre, MD;  Location: [facility] ORS;  Service: Orthopedics;  Laterality: Left;   KNEE SURGERY Left    around 2000, arthroscopic   TOTAL HIP ARTHROPLASTY Right 02/23/2017   Procedure: TOTAL HIP ARTHROPLASTY ANTERIOR APPROACH;  Surgeon: Kennedy Bucker, MD;  Location: [facility] ORS;  Service: Orthopedics;  Laterality: Right;     A IV Location/Drains/Wounds Patient Lines/Drains/Airways Status     Active Line/Drains/Airways     Name Placement date Placement time Site Days   Wound / Incision (Open or Dehisced) 02/24/19 Incision - Open Tibial Left 2cm x 2xm circular open wound x2 (both to left shin) 02/24/19  0900  Tibial  1685            Intake/Output Last 24 hours No intake or output data in the 24 hours ending 10/06/23 1305  Labs/Imaging Results for orders placed or performed during the hospital encounter of 10/06/23 (from the past 48 hours)  Basic metabolic panel     Status: Abnormal   Collection Time: 10/06/23  7:24 AM  Result Value Ref Range   Sodium 142 135 - 145 mmol/L   Potassium 4.1 3.5 - 5.1 mmol/L   Chloride 105 98 - 111 mmol/L   CO2 26 22 - 32 mmol/L   Glucose, Bld 98 70 - 99 mg/dL    Comment: Glucose reference range applies only to samples  taken after fasting for at least 8 hours.   BUN 58 (H) 8 - 23 mg/dL   Creatinine, Ser 9.62 (H) 0.61 - 1.24 mg/dL   Calcium 9.5 8.9 - 95.2 mg/dL   GFR, Estimated 35 (L) >60 mL/min    Comment: (NOTE) Calculated using the CKD-EPI Creatinine Equation (2021)    Anion gap 11 5 - 15    Comment: Performed at [facility], [address] [place], [place] [address]  CBC with Differential     Status: Abnormal   Collection Time: 10/06/23  7:24 AM  Result Value Ref Range   WBC 13.1 (H) 4.0 - 10.5 K/uL   RBC 4.49 4.22 - 5.81 MIL/uL   Hemoglobin 13.5 13.0 - 17.0 g/dL   HCT 44.0 10.2 - 72.5 %   MCV 97.1 80.0 - 100.0 fL   MCH 30.1 26.0 - 34.0 pg   MCHC 31.0 30.0 - 36.0 g/dL   RDW 36.6 (H) 44.0 - 34.7 %   Platelets 198 150 - 400 K/uL   nRBC 0.2 0.0 - 0.2 %   Neutrophils Relative % 73 %   Neutro Abs 9.5 (H) 1.7 - 7.7 K/uL   Lymphocytes Relative 12 %   Lymphs Abs 1.6 0.7 - 4.0 K/uL   Monocytes Relative 13 %   Monocytes Absolute 1.7 (H) 0.1 - 1.0 K/uL   Eosinophils Relative 1 %   Eosinophils Absolute 0.1 0.0 - 0.5 K/uL   Basophils Relative 0 %   Basophils Absolute 0.0 0.0 - 0.1 K/uL   Immature Granulocytes 1 %   Abs Immature Granulocytes 0.11 (H) 0.00 - 0.07 K/uL    Comment: Performed at [facility], [address] [place], [place] [address]  Troponin I (High Sensitivity)     Status: Abnormal   Collection Time: 10/06/23  7:24 AM  Result Value Ref Range   Troponin I (High Sensitivity) 28 (H) <18 ng/L    Comment: (NOTE) Elevated high sensitivity troponin I (hsTnI) values and significant  changes across serial measurements may suggest ACS but many other  chronic and acute conditions are known to elevate hsTnI results.  Refer to the "Links" section for chest pain algorithms and additional  guidance. Performed at [facility], [address], [place], [place] [address]   Brain natriuretic peptide     Status: Abnormal   Collection Time: 10/06/23  7:24 AM  Result Value Ref Range   B  Natriuretic Peptide 1,445.0 (H) 0.0 - 100.0 pg/mL    Comment: Performed at [facility], [address] [place], [place] [address]  CK     Status: None   Collection Time: 10/06/23  7:24 AM  Result Value Ref Range   Total CK 66 49 - 397 U/L    Comment: Performed at [facility], [address] [place], [place] [address]  Troponin I (High Sensitivity)     Status: Abnormal   Collection Time: 10/06/23  9:10 AM  Result Value Ref Range   Troponin I (High Sensitivity) 27 (H) <18 ng/L    Comment: (NOTE) Elevated high sensitivity troponin I (hsTnI) values and significant  changes across serial measurements may suggest ACS but many other  chronic and acute conditions are known to elevate hsTnI results.  Refer to the "Links" section for chest pain algorithms and additional  guidance. Performed at [facility], [address] [place], [place] [address]    CT Chest Wo Contrast Result Date: 10/06/2023 CLINICAL DATA:  Pneumonia, complication suspected, xray done.  Fall. EXAM: CT CHEST WITHOUT CONTRAST TECHNIQUE: Multidetector CT imaging of the chest was performed following the standard protocol without IV contrast. RADIATION DOSE REDUCTION: This exam was performed according to the departmental dose-optimization program which includes automated exposure control, adjustment of the mA and/or kV according to patient size and/or use of iterative reconstruction technique. COMPARISON:  CT scan chest from 11/04/2022. FINDINGS: Cardiovascular: Mild cardiomegaly. No pericardial effusion. No aortic aneurysm. There are coronary artery calcifications, in keeping with coronary artery disease. There are also mild-to-moderate peripheral atherosclerotic vascular calcifications of thoracic aorta and its major branches. Mediastinum/Nodes: Visualized thyroid gland appears grossly unremarkable. No solid / cystic mediastinal masses. The esophagus is nondistended precluding optimal assessment. There are few mildly prominent  mediastinal and hilar lymph nodes, which do not meet the size criteria for lymphadenopathy and appear grossly similar to the prior study, favoring benign etiology. No axillary lymphadenopathy by size criteria. Lungs/Pleura: The central tracheo-bronchial tree is patent. There is moderate right pleural effusion with associated compressive atelectatic changes in the right lower lobe. There are minimal emphysematous changes predominantly in the right upper lobe. There are also dependent changes throughout bilateral lungs. Previously noted 5 mm nodule in the right upper lobe is less conspicuous on today's exam. No new or suspicious lung nodule. No mass, consolidation or pneumothorax. No left pleural effusion. Upper Abdomen: There is a 2.6 x 3.4 cm hypoattenuating structure in the right hepatic dome, segment 8, favored to represent a cyst. There are hyperattenuating contents in the partially visualized gallbladder, which may represent concentrated bile versus sludge. There are scattered calcified granuloma in the spleen. There is dense calcification in the right adrenal gland, which may represent sequela of prior hemorrhage or infection. Remaining visualized upper abdominal viscera within normal limits. Musculoskeletal: The visualized soft tissues of the chest wall are grossly unremarkable. No suspicious osseous lesions. There are mild to moderate multilevel degenerative changes in the visualized spine. There are multiple subacute/healing right-sided rib fractures involving anterior aspect of right third through eighth ribs and posteromedial aspect of right fourth, sixth and ninth ribs. The fracture margins are not sharp and there is surrounding callus formation. IMPRESSION: 1. There is moderate right pleural effusion with associated compressive atelectatic changes in the right lower lobe. Bilateral lungs are otherwise clear of mass, consolidation or pneumothorax. No left pleural effusion. No suspicious lung nodule. 2.  There are subacute/healing fractures of right-sided third through ninth ribs, as described above. 3. Multiple other nonacute observations, as described above. Aortic Atherosclerosis (ICD10-I70.0) and Emphysema (ICD10-J43.9). Electronically Signed   By: Jules Schick M.D.   On: 10/06/2023 08:53   CT Head Wo Contrast Result Date: 10/06/2023 CLINICAL DATA:  Head trauma, minor (Age >= 65y); Neck trauma (Age >= 65y) EXAM: CT HEAD WITHOUT CONTRAST CT CERVICAL SPINE  WITHOUT CONTRAST TECHNIQUE: Multidetector CT imaging of the head and cervical spine was performed following the standard protocol without intravenous contrast. Multiplanar CT image reconstructions of the cervical spine were also generated. RADIATION DOSE REDUCTION: This exam was performed according to the departmental dose-optimization program which includes automated exposure control, adjustment of the mA and/or kV according to patient size and/or use of iterative reconstruction technique. COMPARISON:  CT Head andCTA head/neck 02/14/23 FINDINGS: CT HEAD FINDINGS Brain: No hemorrhage. No hydrocephalus. No extra-axial fluid collection. No CT evidence of an acute cortical infarct. There is background moderate to severe chronic microvascular ischemic change. 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 Vascular: No hyperdense vessel or unexpected calcification. Skull: Soft tissue swelling left frontal scalp. No evidence of underlying calvarial fracture. Sinuses/Orbits: No middle ear effusion. Small right mastoid effusion. Mild mucosal thickening in the right maxillary sinus. Bilateral lens replacement. Orbits are otherwise unremarkable. Other: None. CT CERVICAL SPINE FINDINGS Limitations: Motion degraded exam Alignment: Straightening of the normal cervical lordosis. There is trace anterolisthesis of C2 on C3 that is new compared to 02/14/2023 Skull base and vertebrae: Incomplete fusion of the anterior arch of C1. Apparent cortical irregularity along the  anterior and posterior aspect of the base of the dens and C2 vertebral body is favored to be artifactual and related to motion artifact. There is no evidence of prevertebral soft tissue swelling in this region. Soft tissues and spinal canal: Redemonstrated soft tissue density lesions in the bilateral parotid glands with interval development of a new lesion in the inferior aspect of the right parotid gland measuring 9 mm (series 8, image 41). These remain indeterminate, ENT referral for further workup and evaluation is recommended, if not previously performed. Disc levels: There is likely at least moderate spinal canal narrowing at C6-C7 Upper chest: Small right pleural effusion, new from prior exam Other: None IMPRESSION: 1. No CT evidence of intracranial injury. 2. Soft tissue swelling left frontal scalp. No evidence of underlying calvarial fracture. 3. Motion degraded exam of the cervical spine. Apparent cortical irregularity along the anterior and posterior aspects of the base of the dens and C2 vertebral body is favored to be artifactual and related to motion artifact. There is no evidence of prevertebral soft tissue swelling in this region. If there is high clinical concern for fracture, consider a repeat CT of the cervical spine. 4. Redemonstrated soft tissue density lesions in the bilateral parotid glands with interval development of a new lesion in the inferior aspect of the right parotid gland measuring 9 mm. These remain indeterminate, ENT referral for further workup and evaluation is recommended, if not previously performed. 5. Small right pleural effusion, new from prior exam. Electronically Signed   By: Lorenza Cambridge M.D.   On: 10/06/2023 08:27   CT Cervical Spine Wo Contrast Result Date: 10/06/2023 CLINICAL DATA:  Head trauma, minor (Age >= 65y); Neck trauma (Age >= 65y) EXAM: CT HEAD WITHOUT CONTRAST CT CERVICAL SPINE WITHOUT CONTRAST TECHNIQUE: Multidetector CT imaging of the head and cervical  spine was performed following the standard protocol without intravenous contrast. Multiplanar CT image reconstructions of the cervical spine were also generated. RADIATION DOSE REDUCTION: This exam was performed according to the departmental dose-optimization program which includes automated exposure control, adjustment of the mA and/or kV according to patient size and/or use of iterative reconstruction technique. COMPARISON:  CT Head andCTA head/neck 02/14/23 FINDINGS: CT HEAD FINDINGS Brain: No hemorrhage. No hydrocephalus. No extra-axial fluid collection. No CT evidence of an acute cortical  infarct. There is background moderate to severe chronic microvascular ischemic change. 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 Vascular: No hyperdense vessel or unexpected calcification. Skull: Soft tissue swelling left frontal scalp. No evidence of underlying calvarial fracture. Sinuses/Orbits: No middle ear effusion. Small right mastoid effusion. Mild mucosal thickening in the right maxillary sinus. Bilateral lens replacement. Orbits are otherwise unremarkable. Other: None. CT CERVICAL SPINE FINDINGS Limitations: Motion degraded exam Alignment: Straightening of the normal cervical lordosis. There is trace anterolisthesis of C2 on C3 that is new compared to 02/14/2023 Skull base and vertebrae: Incomplete fusion of the anterior arch of C1. Apparent cortical irregularity along the anterior and posterior aspect of the base of the dens and C2 vertebral body is favored to be artifactual and related to motion artifact. There is no evidence of prevertebral soft tissue swelling in this region. Soft tissues and spinal canal: Redemonstrated soft tissue density lesions in the bilateral parotid glands with interval development of a new lesion in the inferior aspect of the right parotid gland measuring 9 mm (series 8, image 41). These remain indeterminate, ENT referral for further workup and evaluation is recommended, if not previously  performed. Disc levels: There is likely at least moderate spinal canal narrowing at C6-C7 Upper chest: Small right pleural effusion, new from prior exam Other: None IMPRESSION: 1. No CT evidence of intracranial injury. 2. Soft tissue swelling left frontal scalp. No evidence of underlying calvarial fracture. 3. Motion degraded exam of the cervical spine. Apparent cortical irregularity along the anterior and posterior aspects of the base of the dens and C2 vertebral body is favored to be artifactual and related to motion artifact. There is no evidence of prevertebral soft tissue swelling in this region. If there is high clinical concern for fracture, consider a repeat CT of the cervical spine. 4. Redemonstrated soft tissue density lesions in the bilateral parotid glands with interval development of a new lesion in the inferior aspect of the right parotid gland measuring 9 mm. These remain indeterminate, ENT referral for further workup and evaluation is recommended, if not previously performed. 5. Small right pleural effusion, new from prior exam. Electronically Signed   By: Lorenza Cambridge M.D.   On: 10/06/2023 08:27   DG Chest Port 1 View Result Date: 10/06/2023 CLINICAL DATA:  Larey Seat going to the bathroom. EXAM: PORTABLE CHEST 1 VIEW COMPARISON:  AP Lat chest 11/04/2022 FINDINGS: There are acute or at least recent displaced fractures of the lateral right sixth, seventh and eighth ribs, with adjacent airspace disease in the lateral right lower lung field which is probably a pulmonary contusion, and a small quantity of pleural fluid underlying. There is no measurable pneumothorax. On the left there is increased opacity in the lung bases which could be atelectasis or consolidation. This was not seen previously. The remaining lungs are clear. There is stable cardiomegaly without CHF. The mediastinal configuration is normal. There is calcification in the transverse aorta. There is osteopenia with thoracic spondylosis. No  other displaced rib fractures. IMPRESSION: 1. Acute or at least recent displaced fractures of the lateral right sixth, seventh and eighth ribs, with adjacent airspace disease in the lateral right lower lung field which is probably a pulmonary contusion, and a small quantity of pleural fluid underlying. No measurable pneumothorax. 2. Increased opacity in the left lung base which could be atelectasis or consolidation. Low inspiration on exam. 3. Stable cardiomegaly. 4. Aortic atherosclerosis. Electronically Signed   By: Almira Bar M.D.   On: 10/06/2023 07:41  Pending Labs Unresulted Labs (From admission, onward)     Start     Ordered   10/07/23 0500  Basic metabolic panel  Tomorrow morning,   R        10/06/23 1216            Vitals/Pain Today's Vitals   10/06/23 0656 10/06/23 0657 10/06/23 0710 10/06/23 1207  BP:      Pulse:      Temp:    98.2 F (36.8 C)  TempSrc:    Oral  SpO2:      Weight:  86.6 kg    Height:  5\' 8"  (1.727 m)    PainSc: 0-No pain  0-No pain     Isolation Precautions No active isolations  Medications Medications  furosemide (LASIX) injection 20 mg (has no administration in time range)  0.9 %  sodium chloride infusion (has no administration in time range)  Tdap (BOOSTRIX) injection 0.5 mL (0.5 mLs Intramuscular Given 10/06/23 0727)    Mobility manual wheelchair      R Recommendations: See Admitting Provider Note  Report given to: Tori

## 2023-10-06 NOTE — ED Provider Notes (Addendum)
MSE was initiated and I personally evaluated the patient and placed orders (if any) at  6:58 AM on October 06, 2023.  The patient appears stable so that the remainder of the MSE may be completed by another provider.  He lost his balance and fell at home suffering a skin tear to his forehead.  He denies loss of consciousness.  He was unable to wake his caregiver up and lay on the floor for several hours.  He denies other injury.  Last tetanus is unknown, Tdap has been ordered as well as CT of head and cervical spine.        Dione Booze, MD 10/06/23 8295    Dione Booze, MD 10/06/23 0700

## 2023-10-06 NOTE — ED Notes (Signed)
Pt placed on bedside commode, call bell within reach

## 2023-10-06 NOTE — ED Notes (Signed)
Pt's daughter Brandon Melnick called to get an update, so let her know what tests we have done.

## 2023-10-07 DIAGNOSIS — R296 Repeated falls: Secondary | ICD-10-CM | POA: Diagnosis not present

## 2023-10-07 LAB — BASIC METABOLIC PANEL
Anion gap: 11 (ref 5–15)
BUN: 51 mg/dL — ABNORMAL HIGH (ref 8–23)
CO2: 30 mmol/L (ref 22–32)
Calcium: 8.9 mg/dL (ref 8.9–10.3)
Chloride: 102 mmol/L (ref 98–111)
Creatinine, Ser: 1.71 mg/dL — ABNORMAL HIGH (ref 0.61–1.24)
GFR, Estimated: 39 mL/min — ABNORMAL LOW (ref >60)
Glucose, Bld: 83 mg/dL (ref 70–99)
Potassium: 3.5 mmol/L (ref 3.5–5.1)
Sodium: 143 mmol/L (ref 135–145)

## 2023-10-07 LAB — CBC
HCT: 42 % (ref 39.0–52.0)
Hemoglobin: 12.9 g/dL — ABNORMAL LOW (ref 13.0–17.0)
MCH: 29.9 pg (ref 26.0–34.0)
MCHC: 30.7 g/dL (ref 30.0–36.0)
MCV: 97.4 fL (ref 80.0–100.0)
Platelets: 208 10*3/uL (ref 150–400)
RBC: 4.31 MIL/uL (ref 4.22–5.81)
RDW: 16.4 % — ABNORMAL HIGH (ref 11.5–15.5)
WBC: 11 10*3/uL — ABNORMAL HIGH (ref 4.0–10.5)
nRBC: 0 % (ref 0.0–0.2)

## 2023-10-07 MED ORDER — ROPINIROLE HCL 0.25 MG PO TABS
0.2500 mg | ORAL_TABLET | Freq: Every morning | ORAL | Status: DC
  Administered 2023-10-07 – 2023-10-08 (×2): 0.25 mg via ORAL
  Filled 2023-10-07 (×2): qty 1

## 2023-10-07 MED ORDER — FUROSEMIDE 10 MG/ML IJ SOLN
20.0000 mg | Freq: Two times a day (BID) | INTRAMUSCULAR | Status: DC
  Administered 2023-10-08: 20 mg via INTRAVENOUS
  Filled 2023-10-07: qty 2

## 2023-10-07 MED ORDER — NICOTINE 14 MG/24HR TD PT24
14.0000 mg | MEDICATED_PATCH | Freq: Every day | TRANSDERMAL | Status: DC
  Administered 2023-10-07 – 2023-10-08 (×2): 14 mg via TRANSDERMAL
  Filled 2023-10-07 (×2): qty 1

## 2023-10-07 MED ORDER — FUROSEMIDE 10 MG/ML IJ SOLN
40.0000 mg | Freq: Once | INTRAMUSCULAR | Status: AC
  Administered 2023-10-07: 40 mg via INTRAVENOUS
  Filled 2023-10-07: qty 4

## 2023-10-07 MED ORDER — HYDROCORTISONE 1 % EX CREA
TOPICAL_CREAM | Freq: Two times a day (BID) | CUTANEOUS | Status: DC
Start: 2023-10-07 — End: 2023-10-08
  Filled 2023-10-07: qty 28

## 2023-10-07 NOTE — Evaluation (Signed)
Occupational Therapy Evaluation Patient Details Name: Gerald Hall MRN: [id number] DOB: 10-Jul-1935 Today's Date: 10/07/2023   History of Present Illness He lost his balance and fell at home suffering a skin tear to his forehead.  He denies loss of consciousness.  He was unable to wake his caregiver up and lay on the floor for several hours.  He denies other injury   Clinical Impression   Pt admitted for concerns listed above. PTA pt requiring up to mod assist for all transfers and ADL's, with 24/7 caregiver. At this time, pt presents with increased weakness, requiring heavy mod to max assist for all ADL's and functional transfers. OT recommending further therapy to assist pt with returning to prior level of function, as well as  reducing caregiver burden. Acute OT will continue to follow.       If plan is discharge home, recommend the following: A lot of help with walking and/or transfers;A lot of help with bathing/dressing/bathroom;Assistance with cooking/housework;Direct supervision/assist for medications management;Help with stairs or ramp for entrance    Functional Status Assessment  Patient has had a recent decline in their functional status and demonstrates the ability to make significant improvements in function in a reasonable and predictable amount of time.  Equipment Recommendations  None recommended by OT    Recommendations for Other Services       Precautions / Restrictions Precautions Precautions: Fall Restrictions Weight Bearing Restrictions Per Provider Order: No      Mobility Bed Mobility Overal bed mobility: Needs Assistance             General bed mobility comments: mod to max assist per RN, pt up in chair at this time.    Transfers Overall transfer level: Needs assistance Equipment used: Rolling walker (2 wheels) Transfers: Sit to/from Stand Sit to Stand: Mod assist, Max assist           General transfer comment: Mod to max assist - near his  recent baseline      Balance Overall balance assessment: History of Falls, Needs assistance Sitting-balance support: Single extremity supported, Feet supported Sitting balance-Leahy Scale: Fair     Standing balance support: Reliant on assistive device for balance, During functional activity Standing balance-Leahy Scale: Poor Standing balance comment: Multiple recent falls                           ADL either performed or assessed with clinical judgement   ADL Overall ADL's : Needs assistance/impaired                                       General ADL Comments: Pt near his basline, requiring mod+ assist for ADL's and IADL's.     Vision Baseline Vision/History: 0 No visual deficits Ability to See in Adequate Light: 0 Adequate Patient Visual Report: No change from baseline Vision Assessment?: No apparent visual deficits     Perception         Praxis         Pertinent Vitals/Pain Pain Assessment Pain Assessment: Faces Faces Pain Scale: Hurts little more Pain Location: R shoulder Pain Descriptors / Indicators: Aching, Discomfort Pain Intervention(s): Limited activity within patient's tolerance, Monitored during session, Repositioned     Extremity/Trunk Assessment Upper Extremity Assessment Upper Extremity Assessment: Overall WFL for tasks assessed   Lower Extremity Assessment Lower Extremity Assessment: Defer to PT  evaluation   Cervical / Trunk Assessment Cervical / Trunk Assessment: Kyphotic   Communication Communication Communication: Hearing impairment   Cognition Arousal: Alert Behavior During Therapy: WFL for tasks assessed/performed Overall Cognitive Status: Within Functional Limits for tasks assessed                                       General Comments  VSS on 2L    Exercises     Shoulder Instructions      Home Living Family/patient expects to be discharged to:: Private residence Living  Arrangements: Children Available Help at Discharge: Personal care attendant;Available 24 hours/day Type of Home: House Home Access: Ramped entrance     Home Layout: One level     Bathroom Shower/Tub: Producer, television/film/video: Standard Bathroom Accessibility: No   Home Equipment: Agricultural consultant (2 wheels);Rollator (4 wheels);Shower seat;BSC/3in1;Cane - single point;Other (comment)          Prior Functioning/Environment Prior Level of Function : Needs assist             Mobility Comments: Pt is wheelchair bound per care taker.  States he tries to help but basically falls into the wheelchair.  Pt is recieving HH therapy 1x per week. ADLs Comments: Assisted by caregiver        OT Problem List: Decreased strength;Decreased range of motion;Decreased activity tolerance;Impaired balance (sitting and/or standing);Decreased safety awareness;Decreased knowledge of use of DME or AE;Impaired UE functional use      OT Treatment/Interventions: Self-care/ADL training;Therapeutic exercise;Energy conservation;DME and/or AE instruction;Therapeutic activities;Patient/family education;Balance training    OT Goals(Current goals can be found in the care plan section) Acute Rehab OT Goals Patient Stated Goal: To go home OT Goal Formulation: With patient/family Time For Goal Achievement: 09/30/2023 Potential to Achieve Goals: Good ADL Goals Pt Will Perform Grooming: with modified independence;sitting Pt Will Perform Lower Body Bathing: with mod assist;sitting/lateral leans;sit to/from stand Pt Will Perform Lower Body Dressing: with mod assist;sitting/lateral leans;sit to/from stand Pt Will Transfer to Toilet: with mod assist;stand pivot transfer Pt Will Perform Toileting - Clothing Manipulation and hygiene: with mod assist;sitting/lateral leans;sit to/from stand  OT Frequency: Min 1X/week    Co-evaluation              AM-PAC OT "6 Clicks" Daily Activity     Outcome Measure  Help from another person eating meals?: A Little Help from another person taking care of personal grooming?: A Little Help from another person toileting, which includes using toliet, bedpan, or urinal?: A Lot Help from another person bathing (including washing, rinsing, drying)?: A Lot Help from another person to put on and taking off regular upper body clothing?: A Little Help from another person to put on and taking off regular lower body clothing?: A Lot 6 Click Score: 15   End of Session    Activity Tolerance: Patient tolerated treatment well Patient left: in chair;with family/visitor present  OT Visit Diagnosis: Unsteadiness on feet (R26.81);Other abnormalities of gait and mobility (R26.89);Muscle weakness (generalized) (M62.81)                Time: 1610-9604 OT Time Calculation (min): 26 min Charges:  OT General Charges $OT Visit: 1 Visit OT Evaluation $OT Eval Low Complexity: 1 Low OT Treatments $Self Care/Home Management : 8-22 mins  Trish Mage, OTR/L [facility] Acute Rehab  Maor Meckel Elane Bing Plume 10/07/2023, 12:22 PM

## 2023-10-07 NOTE — Progress Notes (Signed)
Patient requires frequent re-positioning of the body in ways that cannot be achieved with an ordinary bed or wedge pillow, to eliminate pain, reduce pressure, and the head of the bed to be elevated more than 30 degrees most of the time due to acute on chronic combined systolic and diastolic CHF (congestive heart failure) -EF 30 to 35 % with GD1 DD.

## 2023-10-07 NOTE — Progress Notes (Signed)
PROGRESS NOTE  Gerald Hall, is a 88 y.o. male, DOB - [birth year], MVH:[id number]  Admit date - 10/06/2023   Admitting Physician Hays Dunnigan Mariea Clonts, MD  Outpatient Primary MD for the patient is Babs Sciara, MD  LOS - 0  Chief Complaint  Patient presents with   Fall       Brief Narrative:  88 y.o. male with past medical history relevant for hearing loss, right worse than left, combined systolic and diastolic dysfunction CHF with EF of 35% and grade 1 diastolic dysfunction, tobacco abuse with COPD and chronic hypoxic respiratory failure on 2 L of oxygen at home, chronic atrial fibrillation, recurrent falls and CKD 3A resents by EMS after an unwitnessed fall at home with bleeding scalp injury     -Assessment and Plan: 1) acute on chronic Combined systolic and diastolic dysfunction CHF with EF of 35% and grade 1 diastolic dysfunction--  -BNP 1,445 CT chest with moderate right pleural effusion associated with compressive atelectasis  Troponin 28 >>27 CK 66 --10/07/23 -Dyspnea, hypoxia and lower extremity edema persist -Continue IV diuretics -Fluid input and output monitoring   2) recurrent falls with scalp injury and multiple subacute rib fractures -CT head without acute findings -CT C-spine without acute findings -There are subacute/healing fractures of right-sided third through ninth ribs (displaced fractures of the lateral right sixth, seventh and eighth ribs, ) He apparently is falling 4 times over the last 4 days--patient apparently has generalized weakness dyspnea on exertion and poor endurance -Also has knee pain with ambulation -He is able to ambulate to the bathroom back-and-forth for longer distances he uses a wheelchair--- lately every time he ambulates to the bathroom he gets short winded and at risk for falling -- Physical therapy eval  noted -Pain control and physical therapy as requested 10/07/23 -Patient and family declined SNF rehab they would like to go home  with home health when is medically ready   3)AKI----acute kidney injury on CKD stage -3A Creatinine is up to 1.85  recent baseline between 1.3-1.6 - creatinine trending down slowly with diuresis --renally adjust medications, avoid nephrotoxic agents / dehydration  / hypotension    4)Leukocytosis--- WBC 13.1 >>11.0--- suspect this is reactive due to recurrent falls   5)COPD/ongoing tobacco abuse/chronic hypoxic respiratory failure--no acute exacerbation at this time -Hold off on steroids -Bronchodilators and supplemental oxygen -PTA patient uses O2 at  2 to 3 L as needed at home   6)Bilateral parathyroid gland lesions-right parotid gland measuring 9 mm----outpatient follow-up advised   7)chronic atrial fibrillation--not on full anticoagulation due to recurrent falls -Continue amiodarone and Coreg for rate control   8) hyperthyroidism--continue Tapazole and Coreg   9)BPH--- continue Flomax   10) social/ethics --- discussed with patient and patient's daughter Maralyn Sago and Harriett Sine -They request DNR/DNI status  Dispo: The patient is from: Home              Anticipated d/c is to:  home with [facility]  Status is: Inpatient   Disposition: The patient is from: Home              Anticipated d/c is to: Home with [facility]               Anticipated d/c date is: 1 day              Patient currently is not medically stable to d/c. Barriers: Not Clinically Stable-   Code Status :  -  Code Status: Do not attempt resuscitation (DNR) PRE-ARREST INTERVENTIONS  DESIRED   Family Communication:   (patient is alert, awake and coherent)  Discussed with daughters Harriett Sine and Maralyn Sago  DVT Prophylaxis  :   - SCDs  heparin injection 5,000 Units Start: 10/06/23 2200 SCDs Start: 10/06/23 1533 Place TED hose Start: 10/06/23 1533  Lab Results  Component Value Date   PLT 208 10/07/2023   Inpatient Medications  Scheduled Meds:  amiodarone  100 mg Oral Daily   aspirin EC  81 mg Oral BID   carvedilol  3.125 mg Oral BID  WC   finasteride  5 mg Oral Daily   [START ON 10/08/2023] furosemide  20 mg Intravenous Q12H   heparin  5,000 Units Subcutaneous Q8H   hydrocortisone cream   Topical BID   methimazole  5 mg Oral Daily   mometasone-formoterol  2 puff Inhalation BID   nicotine  14 mg Transdermal Daily   rOPINIRole  0.25 mg Oral QHS   rOPINIRole  0.25 mg Oral q AM   sodium chloride flush  3 mL Intravenous Q12H   sodium chloride flush  3 mL Intravenous Q12H   tamsulosin  0.4 mg Oral QPC supper   Continuous Infusions: PRN Meds:.acetaminophen **OR** acetaminophen, bisacodyl, ondansetron **OR** ondansetron (ZOFRAN) IV, polyethylene glycol, polyvinyl alcohol, sodium chloride flush, traMADol, traZODone   Anti-infectives (From admission, onward)    None         Subjective: Greysin Jozwiak today has no fevers, no emesis,  No chest pain,   - Fatigue, generalized weakness and dyspnea and increased oxygen requirement persist  Objective: Vitals:   10/07/23 0431 10/07/23 0600 10/07/23 0915 10/07/23 1327  BP: 123/64   108/66  Pulse: 85   77  Resp: 20   17  Temp: 98.4 F (36.9 C)   98.3 F (36.8 C)  TempSrc: Oral     SpO2: 90%  91% 98%  Weight:  86.8 kg    Height:        Intake/Output Summary (Last 24 hours) at 10/07/2023 1825 Last data filed at 10/07/2023 1700 Gross per 24 hour  Intake 900 ml  Output 3150 ml  Net -2250 ml   Filed Weights   10/06/23 0657 10/06/23 1345 10/07/23 0600  Weight: 86.6 kg 91.1 kg 86.8 kg    Physical Exam Gen:- Awake Alert, in no acute distress HEENT:- [place].AT, No sclera icterus, hemostatic scalp abrasion Neck-Supple Neck,No JVD,.  Nose-  5L/min Lungs-somewhat diminished on the right, no wheezing, no rhonchi CV- S1, S2 normal, irregular  Abd-  +ve B.Sounds, Abd Soft, No tenderness,    Extremity/Skin:- 1+  edema, pedal pulses present  Psych-affect is appropriate, oriented x3 Neuro-generalized weakness, no new focal deficits, no tremors  Data Reviewed: I have  personally reviewed following labs and imaging studies  CBC: Recent Labs  Lab 10/06/23 0724 10/07/23 0406  WBC 13.1* 11.0*  NEUTROABS 9.5*  --   HGB 13.5 12.9*  HCT 43.6 42.0  MCV 97.1 97.4  PLT 198 208   Basic Metabolic Panel: Recent Labs  Lab 10/06/23 0724 10/07/23 0406  NA 142 143  K 4.1 3.5  CL 105 102  CO2 26 30  GLUCOSE 98 83  BUN 58* 51*  CREATININE 1.85* 1.71*  CALCIUM 9.5 8.9   GFR: Estimated Creatinine Clearance: 33.2 mL/min (A) (by C-G formula based on SCr of 1.71 mg/dL (H)).  Cardiac Enzymes: Recent Labs  Lab 10/06/23 0724  CKTOTAL 66    Radiology Studies: CT Chest Wo Contrast Result Date: 10/06/2023 CLINICAL DATA:  Pneumonia,  complication suspected, xray done.  Fall. EXAM: CT CHEST WITHOUT CONTRAST TECHNIQUE: Multidetector CT imaging of the chest was performed following the standard protocol without IV contrast. RADIATION DOSE REDUCTION: This exam was performed according to the departmental dose-optimization program which includes automated exposure control, adjustment of the mA and/or kV according to patient size and/or use of iterative reconstruction technique. COMPARISON:  CT scan chest from 11/04/2022. FINDINGS: Cardiovascular: Mild cardiomegaly. No pericardial effusion. No aortic aneurysm. There are coronary artery calcifications, in keeping with coronary artery disease. There are also mild-to-moderate peripheral atherosclerotic vascular calcifications of thoracic aorta and its major branches. Mediastinum/Nodes: Visualized thyroid gland appears grossly unremarkable. No solid / cystic mediastinal masses. The esophagus is nondistended precluding optimal assessment. There are few mildly prominent mediastinal and hilar lymph nodes, which do not meet the size criteria for lymphadenopathy and appear grossly similar to the prior study, favoring benign etiology. No axillary lymphadenopathy by size criteria. Lungs/Pleura: The central tracheo-bronchial tree is patent.  There is moderate right pleural effusion with associated compressive atelectatic changes in the right lower lobe. There are minimal emphysematous changes predominantly in the right upper lobe. There are also dependent changes throughout bilateral lungs. Previously noted 5 mm nodule in the right upper lobe is less conspicuous on today's exam. No new or suspicious lung nodule. No mass, consolidation or pneumothorax. No left pleural effusion. Upper Abdomen: There is a 2.6 x 3.4 cm hypoattenuating structure in the right hepatic dome, segment 8, favored to represent a cyst. There are hyperattenuating contents in the partially visualized gallbladder, which may represent concentrated bile versus sludge. There are scattered calcified granuloma in the spleen. There is dense calcification in the right adrenal gland, which may represent sequela of prior hemorrhage or infection. Remaining visualized upper abdominal viscera within normal limits. Musculoskeletal: The visualized soft tissues of the chest wall are grossly unremarkable. No suspicious osseous lesions. There are mild to moderate multilevel degenerative changes in the visualized spine. There are multiple subacute/healing right-sided rib fractures involving anterior aspect of right third through eighth ribs and posteromedial aspect of right fourth, sixth and ninth ribs. The fracture margins are not sharp and there is surrounding callus formation. IMPRESSION: 1. There is moderate right pleural effusion with associated compressive atelectatic changes in the right lower lobe. Bilateral lungs are otherwise clear of mass, consolidation or pneumothorax. No left pleural effusion. No suspicious lung nodule. 2. There are subacute/healing fractures of right-sided third through ninth ribs, as described above. 3. Multiple other nonacute observations, as described above. Aortic Atherosclerosis (ICD10-I70.0) and Emphysema (ICD10-J43.9). Electronically Signed   By: Jules Schick M.D.    On: 10/06/2023 08:53   CT Head Wo Contrast Result Date: 10/06/2023 CLINICAL DATA:  Head trauma, minor (Age >= 65y); Neck trauma (Age >= 65y) EXAM: CT HEAD WITHOUT CONTRAST CT CERVICAL SPINE WITHOUT CONTRAST TECHNIQUE: Multidetector CT imaging of the head and cervical spine was performed following the standard protocol without intravenous contrast. Multiplanar CT image reconstructions of the cervical spine were also generated. RADIATION DOSE REDUCTION: This exam was performed according to the departmental dose-optimization program which includes automated exposure control, adjustment of the mA and/or kV according to patient size and/or use of iterative reconstruction technique. COMPARISON:  CT Head andCTA head/neck 02/14/23 FINDINGS: CT HEAD FINDINGS Brain: No hemorrhage. No hydrocephalus. No extra-axial fluid collection. No CT evidence of an acute cortical infarct. There is background moderate to severe chronic microvascular ischemic change. 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 Vascular: No hyperdense vessel or unexpected  calcification. Skull: Soft tissue swelling left frontal scalp. No evidence of underlying calvarial fracture. Sinuses/Orbits: No middle ear effusion. Small right mastoid effusion. Mild mucosal thickening in the right maxillary sinus. Bilateral lens replacement. Orbits are otherwise unremarkable. Other: None. CT CERVICAL SPINE FINDINGS Limitations: Motion degraded exam Alignment: Straightening of the normal cervical lordosis. There is trace anterolisthesis of C2 on C3 that is new compared to 02/14/2023 Skull base and vertebrae: Incomplete fusion of the anterior arch of C1. Apparent cortical irregularity along the anterior and posterior aspect of the base of the dens and C2 vertebral body is favored to be artifactual and related to motion artifact. There is no evidence of prevertebral soft tissue swelling in this region. Soft tissues and spinal canal: Redemonstrated soft tissue density  lesions in the bilateral parotid glands with interval development of a new lesion in the inferior aspect of the right parotid gland measuring 9 mm (series 8, image 41). These remain indeterminate, ENT referral for further workup and evaluation is recommended, if not previously performed. Disc levels: There is likely at least moderate spinal canal narrowing at C6-C7 Upper chest: Small right pleural effusion, new from prior exam Other: None IMPRESSION: 1. No CT evidence of intracranial injury. 2. Soft tissue swelling left frontal scalp. No evidence of underlying calvarial fracture. 3. Motion degraded exam of the cervical spine. Apparent cortical irregularity along the anterior and posterior aspects of the base of the dens and C2 vertebral body is favored to be artifactual and related to motion artifact. There is no evidence of prevertebral soft tissue swelling in this region. If there is high clinical concern for fracture, consider a repeat CT of the cervical spine. 4. Redemonstrated soft tissue density lesions in the bilateral parotid glands with interval development of a new lesion in the inferior aspect of the right parotid gland measuring 9 mm. These remain indeterminate, ENT referral for further workup and evaluation is recommended, if not previously performed. 5. Small right pleural effusion, new from prior exam. Electronically Signed   By: Lorenza Cambridge M.D.   On: 10/06/2023 08:27   CT Cervical Spine Wo Contrast Result Date: 10/06/2023 CLINICAL DATA:  Head trauma, minor (Age >= 65y); Neck trauma (Age >= 65y) EXAM: CT HEAD WITHOUT CONTRAST CT CERVICAL SPINE WITHOUT CONTRAST TECHNIQUE: Multidetector CT imaging of the head and cervical spine was performed following the standard protocol without intravenous contrast. Multiplanar CT image reconstructions of the cervical spine were also generated. RADIATION DOSE REDUCTION: This exam was performed according to the departmental dose-optimization program which  includes automated exposure control, adjustment of the mA and/or kV according to patient size and/or use of iterative reconstruction technique. COMPARISON:  CT Head andCTA head/neck 02/14/23 FINDINGS: CT HEAD FINDINGS Brain: No hemorrhage. No hydrocephalus. No extra-axial fluid collection. No CT evidence of an acute cortical infarct. There is background moderate to severe chronic microvascular ischemic change. 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 Vascular: No hyperdense vessel or unexpected calcification. Skull: Soft tissue swelling left frontal scalp. No evidence of underlying calvarial fracture. Sinuses/Orbits: No middle ear effusion. Small right mastoid effusion. Mild mucosal thickening in the right maxillary sinus. Bilateral lens replacement. Orbits are otherwise unremarkable. Other: None. CT CERVICAL SPINE FINDINGS Limitations: Motion degraded exam Alignment: Straightening of the normal cervical lordosis. There is trace anterolisthesis of C2 on C3 that is new compared to 02/14/2023 Skull base and vertebrae: Incomplete fusion of the anterior arch of C1. Apparent cortical irregularity along the anterior and posterior aspect of the base  of the dens and C2 vertebral body is favored to be artifactual and related to motion artifact. There is no evidence of prevertebral soft tissue swelling in this region. Soft tissues and spinal canal: Redemonstrated soft tissue density lesions in the bilateral parotid glands with interval development of a new lesion in the inferior aspect of the right parotid gland measuring 9 mm (series 8, image 41). These remain indeterminate, ENT referral for further workup and evaluation is recommended, if not previously performed. Disc levels: There is likely at least moderate spinal canal narrowing at C6-C7 Upper chest: Small right pleural effusion, new from prior exam Other: None IMPRESSION: 1. No CT evidence of intracranial injury. 2. Soft tissue swelling left frontal scalp. No  evidence of underlying calvarial fracture. 3. Motion degraded exam of the cervical spine. Apparent cortical irregularity along the anterior and posterior aspects of the base of the dens and C2 vertebral body is favored to be artifactual and related to motion artifact. There is no evidence of prevertebral soft tissue swelling in this region. If there is high clinical concern for fracture, consider a repeat CT of the cervical spine. 4. Redemonstrated soft tissue density lesions in the bilateral parotid glands with interval development of a new lesion in the inferior aspect of the right parotid gland measuring 9 mm. These remain indeterminate, ENT referral for further workup and evaluation is recommended, if not previously performed. 5. Small right pleural effusion, new from prior exam. Electronically Signed   By: Lorenza Cambridge M.D.   On: 10/06/2023 08:27   DG Chest Port 1 View Result Date: 10/06/2023 CLINICAL DATA:  Larey Seat going to the bathroom. EXAM: PORTABLE CHEST 1 VIEW COMPARISON:  AP Lat chest 11/04/2022 FINDINGS: There are acute or at least recent displaced fractures of the lateral right sixth, seventh and eighth ribs, with adjacent airspace disease in the lateral right lower lung field which is probably a pulmonary contusion, and a small quantity of pleural fluid underlying. There is no measurable pneumothorax. On the left there is increased opacity in the lung bases which could be atelectasis or consolidation. This was not seen previously. The remaining lungs are clear. There is stable cardiomegaly without CHF. The mediastinal configuration is normal. There is calcification in the transverse aorta. There is osteopenia with thoracic spondylosis. No other displaced rib fractures. IMPRESSION: 1. Acute or at least recent displaced fractures of the lateral right sixth, seventh and eighth ribs, with adjacent airspace disease in the lateral right lower lung field which is probably a pulmonary contusion, and a small  quantity of pleural fluid underlying. No measurable pneumothorax. 2. Increased opacity in the left lung base which could be atelectasis or consolidation. Low inspiration on exam. 3. Stable cardiomegaly. 4. Aortic atherosclerosis. Electronically Signed   By: Almira Bar M.D.   On: 10/06/2023 07:41   Scheduled Meds:  amiodarone  100 mg Oral Daily   aspirin EC  81 mg Oral BID   carvedilol  3.125 mg Oral BID WC   finasteride  5 mg Oral Daily   [START ON 10/08/2023] furosemide  20 mg Intravenous Q12H   heparin  5,000 Units Subcutaneous Q8H   hydrocortisone cream   Topical BID   methimazole  5 mg Oral Daily   mometasone-formoterol  2 puff Inhalation BID   nicotine  14 mg Transdermal Daily   rOPINIRole  0.25 mg Oral QHS   rOPINIRole  0.25 mg Oral q AM   sodium chloride flush  3 mL Intravenous Q12H  sodium chloride flush  3 mL Intravenous Q12H   tamsulosin  0.4 mg Oral QPC supper   Continuous Infusions:   LOS: 0 days   Shon Hale M.D on 10/07/2023 at 6:25 PM  Go to [email or web address] - for contact info  [facility] - Office  [phone]  If 7PM-7AM, please contact night-coverage [email or web address] 10/07/2023, 6:25 PM

## 2023-10-07 NOTE — TOC Initial Note (Signed)
Transition of Care [facility]) - Initial/Assessment Note    Patient Details  Name: Gerald Hall MRN: [id number] Date of Birth: 12-26-1934  Transition of Care [facility]) CM/SW Contact:    [name], LCSWA Phone Number: 10/07/2023, 11:41 AM  Clinical Narrative:                 CSW updated that PT is recommending HH PT at D/C. CSW met with pt and daughter at bedside. Pt states he is not interested in SNF placement and that he plans to return home at D/C. Pt states he has PT with [facility] in the home, CSW reached out to [facility] rep to see if what services pt is active with. Pt has live in caregivers that switch every 7 days and alternate weeks. Pt has a walker, wheelchair, transport chair and home O2. Pts daughter is requesting a hospital bed, CSW to work on referral for hospital bed with [employer]. TOC to follow.   Expected Discharge Plan: Home w Home Health Services Barriers to Discharge: Continued Medical Work up   Patient Goals and CMS Choice Patient states their goals for this hospitalization and ongoing recovery are:: return home with [facility] CMS Medicare.gov Compare Post Acute Care list provided to:: Patient Choice offered to / list presented to : Patient, Adult Children      Expected Discharge Plan and Services In-house Referral: Clinical Social Work Discharge Planning Services: CM Consult Post Acute Care Choice: Home Health Living arrangements for the past 2 months: Single Family Home                                      Prior Living Arrangements/Services Living arrangements for the past 2 months: Single Family Home Lives with:: Self Patient language and need for interpreter reviewed:: Yes Do you feel safe going back to the place where you live?: Yes      Need for Family Participation in Patient Care: Yes (Comment) Care giver support system in place?: Yes (comment) Current home services: DME Criminal Activity/Legal Involvement Pertinent to Current  Situation/Hospitalization: No - Comment as needed  Activities of Daily Living   ADL Screening (condition at time of admission) Independently performs ADLs?: No Does the patient have a NEW difficulty with bathing/dressing/toileting/self-feeding that is expected to last >3 days?: Yes (Initiates electronic notice to provider for possible OT consult) Does the patient have a NEW difficulty with getting in/out of bed, walking, or climbing stairs that is expected to last >3 days?: Yes (Initiates electronic notice to provider for possible PT consult) Does the patient have a NEW difficulty with communication that is expected to last >3 days?: No Is the patient deaf or have difficulty hearing?: Yes Does the patient have difficulty seeing, even when wearing glasses/contacts?: No Does the patient have difficulty concentrating, remembering, or making decisions?: No  Permission Sought/Granted                  Emotional Assessment Appearance:: Appears stated age Attitude/Demeanor/Rapport: Engaged Affect (typically observed): Accepting Orientation: : Oriented to Self, Oriented to Place, Oriented to  Time, Oriented to Situation Alcohol / Substance Use: Not Applicable Psych Involvement: No (comment)  Admission diagnosis:  Parotid mass [K11.8] Falls [R29.6] Injury of head, initial encounter [S09.90XA] Fall, initial encounter [W19.XXXA] Patient Active Problem List   Diagnosis Date Noted   Falls 10/06/2023   Acute on chronic combined systolic and diastolic CHF (congestive  heart failure) -EF 30 to 35 % with GD1 DD 10/06/2023   Acute ischemic stroke (HCC) 02/15/2023   Generalized weakness 02/15/2023   Intractable nausea and vomiting 02/04/2023   Class 1 obesity 02/04/2023   Chronic HFrEF (heart failure with reduced ejection fraction) (HCC) 01/28/2023   Closed displaced fracture of left femoral neck (HCC) 01/26/2023   CKD stage 3a, GFR 45-59 ml/min (HCC) 01/26/2023   Chronic respiratory failure  with hypoxia (HCC) 01/26/2023   Reduced ejection fraction concurrent with and due to chronic heart failure (HCC) 06/23/2022   Aortic atherosclerosis (HCC) 06/23/2022   Hyperthyroidism 03/04/2022   COPD (chronic obstructive pulmonary disease) (HCC) 11/22/2021   Centrilobular emphysema (HCC) 11/12/2020   Secondary cardiomyopathy (HCC) 11/12/2020   Subacute thyroiditis 09/25/2020   Brow ptosis, bilateral 07/03/2020   Dermatochalasis of both upper eyelids 07/03/2020   Ptosis, both eyelids 07/03/2020   Sensorineural hearing loss (SNHL) of right ear with restricted hearing of left ear 03/06/2020   Atrial fibrillation, chronic (HCC) 02/24/2019   Primary osteoarthritis involving multiple joints 02/21/2017   Current smoker 02/21/2017   AMD (age-related macular degeneration), bilateral 11/10/2016   Insomnia 07/20/2016   Restless legs 07/20/2016   Spinal stenosis in cervical region 10/14/2015   Senile purpura (HCC) 04/18/2015   Osteopenia 01/28/2015   Spinal stenosis of lumbar region 01/20/2015   BPH (benign prostatic hyperplasia) 01/20/2015   HTN (hypertension), benign 04/17/2014   Basal cell carcinoma of face 11/08/2011   PCP:  Babs Sciara, MD Pharmacy:   [facility] - [place], [place] - [address] [place] [address] Phone: [phone] Fax: [phone]     Social Drivers of Health (SDOH) Social History: SDOH Screenings   Food Insecurity: No Food Insecurity (10/06/2023)  Housing: Low Risk  (10/06/2023)  Transportation Needs: No Transportation Needs (10/06/2023)  Utilities: Not At Risk (10/06/2023)  Alcohol Screen: Low Risk  (03/16/2022)  Depression (PHQ2-9): Low Risk  (09/05/2023)  Financial Resource Strain: Low Risk  (02/28/2023)   [facility] from [facility], [facility]  Physical Activity: Inactive (02/28/2023)   Received from [facility], [facility]  Social Connections: Moderately Isolated (02/28/2023)   Received from [facility], [facility]  Stress: Stress Concern Present (02/28/2023)   Received from [facility], [facility]  Tobacco Use: Medium Risk (10/06/2023)  Health Literacy: Low Risk  (03/11/2023)   Received from [facility], [facility]   SDOH Interventions:     Readmission Risk Interventions     No data to display

## 2023-10-08 DIAGNOSIS — R296 Repeated falls: Secondary | ICD-10-CM | POA: Diagnosis not present

## 2023-10-08 LAB — RENAL FUNCTION PANEL
Albumin: 3.3 g/dL — ABNORMAL LOW (ref 3.5–5.0)
Anion gap: 12 (ref 5–15)
BUN: 43 mg/dL — ABNORMAL HIGH (ref 8–23)
CO2: 30 mmol/L (ref 22–32)
Calcium: 9.1 mg/dL (ref 8.9–10.3)
Chloride: 101 mmol/L (ref 98–111)
Creatinine, Ser: 1.6 mg/dL — ABNORMAL HIGH (ref 0.61–1.24)
GFR, Estimated: 42 mL/min — ABNORMAL LOW (ref >60)
Glucose, Bld: 189 mg/dL — ABNORMAL HIGH (ref 70–99)
Phosphorus: 2.6 mg/dL (ref 2.5–4.6)
Potassium: 3.2 mmol/L — ABNORMAL LOW (ref 3.5–5.1)
Sodium: 143 mmol/L (ref 135–145)

## 2023-10-08 LAB — CBC
HCT: 45.3 % (ref 39.0–52.0)
Hemoglobin: 14.2 g/dL (ref 13.0–17.0)
MCH: 30 pg (ref 26.0–34.0)
MCHC: 31.3 g/dL (ref 30.0–36.0)
MCV: 95.6 fL (ref 80.0–100.0)
Platelets: 244 10*3/uL (ref 150–400)
RBC: 4.74 MIL/uL (ref 4.22–5.81)
RDW: 16.4 % — ABNORMAL HIGH (ref 11.5–15.5)
WBC: 12.8 10*3/uL — ABNORMAL HIGH (ref 4.0–10.5)
nRBC: 0 % (ref 0.0–0.2)

## 2023-10-08 MED ORDER — ROPINIROLE HCL 2 MG PO TABS: 1.0000 mg | ORAL_TABLET | ORAL | 3 refills | Status: DC

## 2023-10-08 MED ORDER — TRAZODONE HCL 50 MG PO TABS: 75.0000 mg | ORAL_TABLET | Freq: Every day | ORAL | 1 refills | Status: DC

## 2023-10-08 MED ORDER — IPRATROPIUM-ALBUTEROL 0.5-2.5 (3) MG/3ML IN SOLN: 3.0000 mL | RESPIRATORY_TRACT | 12 refills | Status: DC | PRN

## 2023-10-08 MED ORDER — AMIODARONE HCL 200 MG PO TABS: 100.0000 mg | ORAL_TABLET | Freq: Every day | ORAL | 2 refills | Status: DC

## 2023-10-08 MED ORDER — CARVEDILOL 3.125 MG PO TABS: 3.1250 mg | ORAL_TABLET | Freq: Two times a day (BID) | ORAL | 5 refills | Status: DC

## 2023-10-08 MED ORDER — ALBUTEROL SULFATE HFA 108 (90 BASE) MCG/ACT IN AERS: 2.0000 | INHALATION_SPRAY | RESPIRATORY_TRACT | 5 refills | Status: DC | PRN

## 2023-10-08 MED ORDER — POTASSIUM CHLORIDE CRYS ER 20 MEQ PO TBCR
40.0000 meq | EXTENDED_RELEASE_TABLET | ORAL | Status: AC
Start: 2023-10-08 — End: 2023-10-08
  Administered 2023-10-08 (×2): 40 meq via ORAL
  Filled 2023-10-08 (×2): qty 2

## 2023-10-08 MED ORDER — TORSEMIDE 20 MG PO TABS: 20.0000 mg | ORAL_TABLET | ORAL | 5 refills | Status: DC

## 2023-10-08 MED ORDER — POTASSIUM CHLORIDE ER 20 MEQ PO TBCR: 20.0000 meq | EXTENDED_RELEASE_TABLET | Freq: Every day | ORAL | 2 refills | Status: DC

## 2023-10-08 NOTE — Plan of Care (Signed)
  Problem: Education: Goal: Knowledge of General Education information will improve Description Including pain rating scale, medication(s)/side effects and non-pharmacologic comfort measures Outcome: Progressing   Problem: Health Behavior/Discharge Planning: Goal: Ability to manage health-related needs will improve Outcome: Progressing   

## 2023-10-08 NOTE — Discharge Summary (Signed)
Gerald Hall, is a 88 y.o. male  DOB 1935/08/01  MRN [id number].  Admission date:  10/06/2023  Admitting Physician  Shon Hale, MD  Discharge Date:  10/08/2023   Primary MD  Babs Sciara, MD  Recommendations for primary care physician for things to follow:  1)- Take  additional 1 mg of Requip/Ropinirole during the day as needed for restless leg -- Take 2 mg at bedtime,  2)Take Torsemide/Demadex-----Take 1 tablet (20 mg) daily thru Wednesday 10/12/2023 inclusive, then go back to 10 mg daily after that  3)Very low-salt diet advised--- less than 2 g of sodium per day advised  4)Weigh yourself daily, call if you gain more than 3 pounds in 1 day or more than 5 pounds in 1 week as your diuretic medications may need to be adjusted  5)you need oxygen at home at 2 to 3 L via nasal cannula continuously while awake and while asleep--- smoking or having open fires around oxygen can cause fire, significant injury and death  Admission Diagnosis  Parotid mass [K11.8] Falls [R29.6] Injury of head, initial encounter [S09.90XA] Fall, initial encounter [W19.XXXA]   Discharge Diagnosis  Parotid mass [K11.8] Falls [R29.6] Injury of head, initial encounter [S09.90XA] Fall, initial encounter [W19.XXXA]    Principal Problem:   Falls Active Problems:   Acute on chronic combined systolic and diastolic CHF (congestive heart failure) -EF 30 to 35 % with GD1 DD   Atrial fibrillation, chronic (HCC)   HTN (hypertension), benign   CKD stage 3a, GFR 45-59 ml/min (HCC)      Past Medical History:  Diagnosis Date   Aortic atherosclerosis (HCC) 06/23/2022   Seen on CAT scan   Arthritis    Balance problem    Uses a walker   Blood in urine    Sees urology yearly   Chronic HFrEF (heart failure with reduced ejection fraction) (HCC)    Chronic kidney disease, stage 3 (HCC)    Coronary artery calcification seen on CT  scan    Deafness in right ear age 15   Hyperlipidemia    Hypertension    Intractable nausea and vomiting 02/04/2023   Prolonged QT interval    PSVT (paroxysmal supraventricular tachycardia) (HCC)    Temporal arteritis (HCC) 2016   Urinary incontinence     Past Surgical History:  Procedure Laterality Date   BACK SURGERY  2010   [facility] L4-L5 decompressive laminectomy   BELPHAROPTOSIS REPAIR     CATARACT EXTRACTION Bilateral 07/30/14, 08/13/2014   CERVICAL SPINE SURGERY  09/01/2015   C 4-5 , [facility] , [place]   COLONOSCOPY  12/19/2007   RMR: 1. External hemorrohids, otherwise normal rectum.2. Normal colon. 3. Normal terminal ileum.   COLONOSCOPY N/A 12/13/2014   Procedure: COLONOSCOPY;  Surgeon: Corbin Ade, MD;  Location: [facility];  Service: Endoscopy;  Laterality: N/A;  11:15 Pt Request Time   HIP ARTHROPLASTY Left 01/27/2023   Procedure: ARTHROPLASTY BIPOLAR HIP (HEMIARTHROPLASTY);  Surgeon: Oliver Barre, MD;  Location: [facility] ORS;  Service: Orthopedics;  Laterality: Left;   KNEE SURGERY Left    around 2000, arthroscopic   TOTAL HIP ARTHROPLASTY Right 02/23/2017   Procedure: TOTAL HIP ARTHROPLASTY ANTERIOR APPROACH;  Surgeon: Kennedy Bucker, MD;  Location: [facility] ORS;  Service: Orthopedics;  Laterality: Right;     HPI  from the history and physical done on the day of admission:   Gerald Hall  is a 88 y.o. male with past medical history relevant for hearing loss, right worse than left, combined systolic and diastolic dysfunction CHF with EF of 35% and grade 1 diastolic dysfunction, tobacco abuse with COPD and chronic hypoxic respiratory failure on 2 L of oxygen at home, chronic atrial fibrillation, recurrent falls and CKD 3A resents by EMS after an unwitnessed fall at home with bleeding scalp injury -Patient has 24/7 care around the house -Patient's daughter Maralyn Sago and patient's paid caregiver both at bedside -He apparently is falling 4 times over the last  4 days--patient apparently has generalized weakness dyspnea on exertion and poor endurance -Also has knee pain with ambulation -He is able to ambulate to the bathroom back-and-forth for longer distances he uses a wheelchair--- lately every time he ambulates to the bathroom he gets short winded and at risk for falling -- CT chest with moderate right pleural effusion associated with compressive atelectasis in the right lower lobe, no pneumothorax no consolidation no mass.. -- There are subacute/healing fractures of right-sided third through ninth ribs (displaced fractures of the lateral right sixth, seventh and eighth ribs, ) -Bilateral parathyroid gland lesions-right parotid gland measuring 9 mm.  -CT head without acute findings Troponin 28 >>27 CK 66 -BNP 1,445 -WBC 13.1 hemoglobin 13.5 platelets 198 -Creatinine is up to 1.85 recent baseline between 1.3-1.6   Hospital Course:   Brief Narrative:  88 y.o. male with past medical history relevant for hearing loss, right worse than left, combined systolic and diastolic dysfunction CHF with EF of 35% and grade 1 diastolic dysfunction, tobacco abuse with COPD and chronic hypoxic respiratory failure on 2 L of oxygen at home, chronic atrial fibrillation, recurrent falls and CKD 3A resents by EMS after an unwitnessed fall at home with bleeding scalp injury      -Assessment and Plan: 1) acute on chronic Combined systolic and diastolic dysfunction CHF with EF of 35% and grade 1 diastolic dysfunction--  -BNP 1,445 CT chest with moderate right pleural effusion associated with compressive atelectasis  Troponin 28 >>27 CK 66 -Much improved with IV diuretics---fluid balance is negative, weight is down to 186 pounds from 191 pounds yesterday -Discharged home with adjusted dose of Torsemide--- please see discharge instructions and discharge med rec  Intake/Output Summary (Last 24 hours) at 10/08/2023 1344 Last data filed at 10/08/2023 1300 Gross per 24  hour  Intake 1020 ml  Output 2325 ml  Net -1305 ml      2)Recurrent falls with scalp injury and multiple subacute rib fractures -CT head without acute findings -CT C-spine without acute findings -There are subacute/healing fractures of right-sided third through ninth ribs (displaced fractures of the lateral right sixth, seventh and eighth ribs, ) He apparently is falling 4 times over the last 4 days--patient apparently has generalized weakness dyspnea on exertion and poor endurance -Also has knee pain with ambulation -He is able to ambulate to the bathroom back-and-forth, however for longer distances he uses a wheelchair--- lately every time he ambulates to the bathroom he gets short winded and at risk for falling -- Physical therapy eval appreciated --Patient  and family declined SNF rehab they would like to go home with home health -patient has paid caregivers/24-hour around-the-clock care at home   3)AKI----acute kidney injury on CKD stage -3A Creatinine on admission was up to 1.85  recent baseline between 1.3-1.6 -At the time of discharge creatinine is back to baseline of 1.6 after diuresis --renally adjust medications, avoid nephrotoxic agents / dehydration  / hypotension    4)Leukocytosis---   suspect this is reactive due to recurrent falls -No fevers or evidence of infection per se   5)COPD/ongoing tobacco abuse/chronic hypoxic respiratory failure--no acute exacerbation at this time -Hold off on steroids -Bronchodilators and supplemental oxygen -PTA patient uses O2 at  2 to 3 L as needed at home   6)Bilateral parathyroid gland lesions-right parotid gland measuring 9 mm----outpatient follow-up advised   7)chronic atrial fibrillation--not on full anticoagulation due to recurrent falls -Continue amiodarone and Coreg for rate control   8) hyperthyroidism--continue Tapazole and Coreg   9)BPH--- continue Flomax   10) social/ethics --- discussed with patient and patient's  daughter Maralyn Sago and Harriett Sine -They request DNR/DNI status   Dispo: The patient is from: Home              Anticipated d/c is to:  home with [facility]   Disposition: The patient is from: Home              Anticipated d/c is to: Home with [facility]   Discharge Condition: stable  Follow UP   Follow-up Information     Babs Sciara, MD Follow up in 2 week(s).   Specialty: Family Medicine Contact information: [address] [place] [address] [phone]                 Diet and Activity recommendation:  As advised  Discharge Instructions    Discharge Instructions     Call MD for:  difficulty breathing, headache or visual disturbances   Complete by: As directed    Call MD for:  persistant dizziness or light-headedness   Complete by: As directed    Call MD for:  persistant nausea and vomiting   Complete by: As directed    Call MD for:  temperature >100.4   Complete by: As directed    Diet - low sodium heart healthy   Complete by: As directed    Discharge instructions   Complete by: As directed    1)- Take  additional 1 mg of Requip/Ropinirole during the day as needed for restless leg -- Take 2 mg at bedtime,  2)Take Torsemide/Demadex-----Take 1 tablet (20 mg) daily thru Wednesday 10/12/2023 inclusive, then go back to 10 mg daily after that  3)Very low-salt diet advised--- less than 2 g of sodium per day advised  4)Weigh yourself daily, call if you gain more than 3 pounds in 1 day or more than 5 pounds in 1 week as your diuretic medications may need to be adjusted  5)you need oxygen at home at 2 to 3 L via nasal cannula continuously while awake and while asleep--- smoking or having open fires around oxygen can cause fire, significant injury and death   Increase activity slowly   Complete by: As directed          Discharge Medications     Allergies as of 10/08/2023       Reactions   Cephalexin Diarrhea   General weakness   Penicillins Nausea And  Vomiting, Rash   GI UPSET  Medication List     STOP taking these medications    diphenhydrAMINE 25 mg capsule Commonly known as: BENADRYL   doxycycline 100 MG tablet Commonly known as: VIBRA-TABS   predniSONE 50 MG tablet Commonly known as: DELTASONE       TAKE these medications    acetaminophen 500 MG tablet Commonly known as: TYLENOL Take 1,000 mg by mouth every 6 (six) hours as needed for mild pain or headache.   albuterol 108 (90 Base) MCG/ACT inhaler Commonly known as: VENTOLIN HFA Inhale 2 puffs into the lungs every 4 (four) hours as needed for wheezing.   amiodarone 200 MG tablet Commonly known as: PACERONE Take 0.5 tablets (100 mg total) by mouth daily.   aspirin EC 81 MG tablet Take 1 tablet (81 mg total) by mouth 2 (two) times daily. Swallow whole. What changed: when to take this   budesonide-formoterol 160-4.5 MCG/ACT inhaler Commonly known as: Symbicort Inhale 2 puffs into the lungs 2 (two) times daily.   calcium carbonate 600 MG Tabs tablet Commonly known as: OS-CAL Take 600 mg by mouth daily with breakfast.   carvedilol 3.125 MG tablet Commonly known as: COREG Take 1 tablet (3.125 mg total) by mouth 2 (two) times daily.   diclofenac Sodium 1 % Gel Commonly known as: VOLTAREN Apply 2 g topically daily as needed (pain).   feeding supplement (GLUCERNA SHAKE) Liqd Take 237 mLs by mouth 3 (three) times daily between meals. What changed: when to take this   finasteride 5 MG tablet Commonly known as: PROSCAR TAKE ONE TABLET BY MOUTH EVERY DAY   fluticasone 50 MCG/ACT nasal spray Commonly known as: FLONASE Place 2 sprays into both nostrils at bedtime.   GNP Vitamin C 500 MG tablet Generic drug: ascorbic acid TAKE ONE TABLET BY MOUTH EVERY MORNING   GNP Vitamin D3 Extra Strength 25 MCG (1000 UT) tablet Generic drug: Cholecalciferol TAKE ONE TABLET BY MOUTH EVERY MORNING   ipratropium-albuterol 0.5-2.5 (3) MG/3ML Soln Commonly  known as: DUONEB Take 3 mLs by nebulization every 4 (four) hours as needed.   Jardiance 10 MG Tabs tablet Generic drug: empagliflozin TAKE ONE TABLET BY MOUTH ONCE DAILY BEFORE BREAKFAST What changed: See the new instructions.   methimazole 5 MG tablet Commonly known as: TAPAZOLE TAKE ONE TABLET BY MOUTH EVERY DAY   Myrbetriq 50 MG Tb24 tablet Generic drug: mirabegron ER Take 50 mg by mouth daily.   OXYGEN Inhale 2 L into the lungs as needed (shortness of breath).   oxymetazoline 0.05 % nasal spray Commonly known as: AFRIN Place 1 spray into both nostrils at bedtime.   polyethylene glycol 17 g packet Commonly known as: MIRALAX / GLYCOLAX Take 17 g by mouth daily as needed for mild constipation.   polyvinyl alcohol 1.4 % ophthalmic solution Commonly known as: LIQUIFILM TEARS Place 1 drop into both eyes as needed for dry eyes.   Potassium Chloride ER 20 MEQ Tbcr Take 1 tablet (20 mEq total) by mouth daily. --- Take while taking Torsemide/Demadex diuretic What changed:  medication strength additional instructions   rOPINIRole 2 MG tablet Commonly known as: REQUIP Take 0.5-1 tablets (1-2 mg total) by mouth See admin instructions. Take  additional 1 mg of Requip/Ropinirole during the day as needed for restless leg -- Take 2 mg at bedtime, What changed: See the new instructions.   tamsulosin 0.4 MG Caps capsule Commonly known as: FLOMAX TAKE (1) CAPSULE BY MOUTH TWICE DAILY.   torsemide 20 MG tablet Commonly known as:  DEMADEX Take 1 tablet (20 mg total) by mouth See admin instructions. Take 1 tablet (20 mg) daily thru Wednesday 10/12/2023 inclusive, then go back to 10 mg daily after that What changed:  how much to take how to take this when to take this additional instructions   traMADol 50 MG tablet Commonly known as: ULTRAM TAKE ONE TABLET BY MOUTH THREE TIMES DAILY AS NEEDED FOR PAIN What changed:  how much to take how to take this when to take  this additional instructions   traZODone 50 MG tablet Commonly known as: DESYREL Take 1.5 tablets (75 mg total) by mouth at bedtime. For Sleep What changed: additional instructions   triamcinolone cream 0.1 % Commonly known as: KENALOG Apply thin amount two times daily   ZINC ACETATE PO Take 1 tablet by mouth daily.   Zinc Plus Vitamin C 50-90 MG Caps Generic drug: Zinc Oxide-Vitamin C TAKE ONE TABLET BY MOUTH EVERY MORNING               Durable Medical Equipment  (From admission, onward)           Start     Ordered   10/07/23 1505  For home use only DME Hospital bed  Once       Comments: Patient requires frequent re-positioning of the body in ways that cannot be achieved with an ordinary bed or wedge pillow, to eliminate pain, reduce pressure, and the head of the bed to be elevated more than 30 degrees most of the time due to acute on chronic combined systolic and diastolic CHF (congestive heart failure) -EF 30 to 35 % with GD1 DD.  Question Answer Comment  Length of Need Lifetime   Patient has (list medical condition): CHF with generalized weakness/Dyspnea on Exertion------history of Falls   The above medical condition requires: Patient requires the ability to reposition frequently   Head must be elevated greater than: 30 degrees   Bed type Semi-electric   Support Surface: Gel Overlay      10/07/23 1506            Major procedures and Radiology Reports - PLEASE review detailed and final reports for all details, in brief -   CT Chest Wo Contrast Result Date: 10/06/2023 CLINICAL DATA:  Pneumonia, complication suspected, xray done.  Fall. EXAM: CT CHEST WITHOUT CONTRAST TECHNIQUE: Multidetector CT imaging of the chest was performed following the standard protocol without IV contrast. RADIATION DOSE REDUCTION: This exam was performed according to the departmental dose-optimization program which includes automated exposure control, adjustment of the mA and/or kV  according to patient size and/or use of iterative reconstruction technique. COMPARISON:  CT scan chest from 11/04/2022. FINDINGS: Cardiovascular: Mild cardiomegaly. No pericardial effusion. No aortic aneurysm. There are coronary artery calcifications, in keeping with coronary artery disease. There are also mild-to-moderate peripheral atherosclerotic vascular calcifications of thoracic aorta and its major branches. Mediastinum/Nodes: Visualized thyroid gland appears grossly unremarkable. No solid / cystic mediastinal masses. The esophagus is nondistended precluding optimal assessment. There are few mildly prominent mediastinal and hilar lymph nodes, which do not meet the size criteria for lymphadenopathy and appear grossly similar to the prior study, favoring benign etiology. No axillary lymphadenopathy by size criteria. Lungs/Pleura: The central tracheo-bronchial tree is patent. There is moderate right pleural effusion with associated compressive atelectatic changes in the right lower lobe. There are minimal emphysematous changes predominantly in the right upper lobe. There are also dependent changes throughout bilateral lungs. Previously noted 5 mm nodule in  the right upper lobe is less conspicuous on today's exam. No new or suspicious lung nodule. No mass, consolidation or pneumothorax. No left pleural effusion. Upper Abdomen: There is a 2.6 x 3.4 cm hypoattenuating structure in the right hepatic dome, segment 8, favored to represent a cyst. There are hyperattenuating contents in the partially visualized gallbladder, which may represent concentrated bile versus sludge. There are scattered calcified granuloma in the spleen. There is dense calcification in the right adrenal gland, which may represent sequela of prior hemorrhage or infection. Remaining visualized upper abdominal viscera within normal limits. Musculoskeletal: The visualized soft tissues of the chest wall are grossly unremarkable. No suspicious osseous  lesions. There are mild to moderate multilevel degenerative changes in the visualized spine. There are multiple subacute/healing right-sided rib fractures involving anterior aspect of right third through eighth ribs and posteromedial aspect of right fourth, sixth and ninth ribs. The fracture margins are not sharp and there is surrounding callus formation. IMPRESSION: 1. There is moderate right pleural effusion with associated compressive atelectatic changes in the right lower lobe. Bilateral lungs are otherwise clear of mass, consolidation or pneumothorax. No left pleural effusion. No suspicious lung nodule. 2. There are subacute/healing fractures of right-sided third through ninth ribs, as described above. 3. Multiple other nonacute observations, as described above. Aortic Atherosclerosis (ICD10-I70.0) and Emphysema (ICD10-J43.9). Electronically Signed   By: Jules Schick M.D.   On: 10/06/2023 08:53   CT Head Wo Contrast Result Date: 10/06/2023 CLINICAL DATA:  Head trauma, minor (Age >= 65y); Neck trauma (Age >= 65y) EXAM: CT HEAD WITHOUT CONTRAST CT CERVICAL SPINE WITHOUT CONTRAST TECHNIQUE: Multidetector CT imaging of the head and cervical spine was performed following the standard protocol without intravenous contrast. Multiplanar CT image reconstructions of the cervical spine were also generated. RADIATION DOSE REDUCTION: This exam was performed according to the departmental dose-optimization program which includes automated exposure control, adjustment of the mA and/or kV according to patient size and/or use of iterative reconstruction technique. COMPARISON:  CT Head andCTA head/neck 02/14/23 FINDINGS: CT HEAD FINDINGS Brain: No hemorrhage. No hydrocephalus. No extra-axial fluid collection. No CT evidence of an acute cortical infarct. There is background moderate to severe chronic microvascular ischemic change. 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 Vascular: No hyperdense vessel or unexpected  calcification. Skull: Soft tissue swelling left frontal scalp. No evidence of underlying calvarial fracture. Sinuses/Orbits: No middle ear effusion. Small right mastoid effusion. Mild mucosal thickening in the right maxillary sinus. Bilateral lens replacement. Orbits are otherwise unremarkable. Other: None. CT CERVICAL SPINE FINDINGS Limitations: Motion degraded exam Alignment: Straightening of the normal cervical lordosis. There is trace anterolisthesis of C2 on C3 that is new compared to 02/14/2023 Skull base and vertebrae: Incomplete fusion of the anterior arch of C1. Apparent cortical irregularity along the anterior and posterior aspect of the base of the dens and C2 vertebral body is favored to be artifactual and related to motion artifact. There is no evidence of prevertebral soft tissue swelling in this region. Soft tissues and spinal canal: Redemonstrated soft tissue density lesions in the bilateral parotid glands with interval development of a new lesion in the inferior aspect of the right parotid gland measuring 9 mm (series 8, image 41). These remain indeterminate, ENT referral for further workup and evaluation is recommended, if not previously performed. Disc levels: There is likely at least moderate spinal canal narrowing at C6-C7 Upper chest: Small right pleural effusion, new from prior exam Other: None IMPRESSION: 1. No CT evidence of intracranial  injury. 2. Soft tissue swelling left frontal scalp. No evidence of underlying calvarial fracture. 3. Motion degraded exam of the cervical spine. Apparent cortical irregularity along the anterior and posterior aspects of the base of the dens and C2 vertebral body is favored to be artifactual and related to motion artifact. There is no evidence of prevertebral soft tissue swelling in this region. If there is high clinical concern for fracture, consider a repeat CT of the cervical spine. 4. Redemonstrated soft tissue density lesions in the bilateral parotid  glands with interval development of a new lesion in the inferior aspect of the right parotid gland measuring 9 mm. These remain indeterminate, ENT referral for further workup and evaluation is recommended, if not previously performed. 5. Small right pleural effusion, new from prior exam. Electronically Signed   By: Lorenza Cambridge M.D.   On: 10/06/2023 08:27   CT Cervical Spine Wo Contrast Result Date: 10/06/2023 CLINICAL DATA:  Head trauma, minor (Age >= 65y); Neck trauma (Age >= 65y) EXAM: CT HEAD WITHOUT CONTRAST CT CERVICAL SPINE WITHOUT CONTRAST TECHNIQUE: Multidetector CT imaging of the head and cervical spine was performed following the standard protocol without intravenous contrast. Multiplanar CT image reconstructions of the cervical spine were also generated. RADIATION DOSE REDUCTION: This exam was performed according to the departmental dose-optimization program which includes automated exposure control, adjustment of the mA and/or kV according to patient size and/or use of iterative reconstruction technique. COMPARISON:  CT Head andCTA head/neck 02/14/23 FINDINGS: CT HEAD FINDINGS Brain: No hemorrhage. No hydrocephalus. No extra-axial fluid collection. No CT evidence of an acute cortical infarct. There is background moderate to severe chronic microvascular ischemic change. 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 Vascular: No hyperdense vessel or unexpected calcification. Skull: Soft tissue swelling left frontal scalp. No evidence of underlying calvarial fracture. Sinuses/Orbits: No middle ear effusion. Small right mastoid effusion. Mild mucosal thickening in the right maxillary sinus. Bilateral lens replacement. Orbits are otherwise unremarkable. Other: None. CT CERVICAL SPINE FINDINGS Limitations: Motion degraded exam Alignment: Straightening of the normal cervical lordosis. There is trace anterolisthesis of C2 on C3 that is new compared to 02/14/2023 Skull base and vertebrae: Incomplete fusion of  the anterior arch of C1. Apparent cortical irregularity along the anterior and posterior aspect of the base of the dens and C2 vertebral body is favored to be artifactual and related to motion artifact. There is no evidence of prevertebral soft tissue swelling in this region. Soft tissues and spinal canal: Redemonstrated soft tissue density lesions in the bilateral parotid glands with interval development of a new lesion in the inferior aspect of the right parotid gland measuring 9 mm (series 8, image 41). These remain indeterminate, ENT referral for further workup and evaluation is recommended, if not previously performed. Disc levels: There is likely at least moderate spinal canal narrowing at C6-C7 Upper chest: Small right pleural effusion, new from prior exam Other: None IMPRESSION: 1. No CT evidence of intracranial injury. 2. Soft tissue swelling left frontal scalp. No evidence of underlying calvarial fracture. 3. Motion degraded exam of the cervical spine. Apparent cortical irregularity along the anterior and posterior aspects of the base of the dens and C2 vertebral body is favored to be artifactual and related to motion artifact. There is no evidence of prevertebral soft tissue swelling in this region. If there is high clinical concern for fracture, consider a repeat CT of the cervical spine. 4. Redemonstrated soft tissue density lesions in the bilateral parotid glands with interval development  of a new lesion in the inferior aspect of the right parotid gland measuring 9 mm. These remain indeterminate, ENT referral for further workup and evaluation is recommended, if not previously performed. 5. Small right pleural effusion, new from prior exam. Electronically Signed   By: Lorenza Cambridge M.D.   On: 10/06/2023 08:27   DG Chest Port 1 View Result Date: 10/06/2023 CLINICAL DATA:  Larey Seat going to the bathroom. EXAM: PORTABLE CHEST 1 VIEW COMPARISON:  AP Lat chest 11/04/2022 FINDINGS: There are acute or at least  recent displaced fractures of the lateral right sixth, seventh and eighth ribs, with adjacent airspace disease in the lateral right lower lung field which is probably a pulmonary contusion, and a small quantity of pleural fluid underlying. There is no measurable pneumothorax. On the left there is increased opacity in the lung bases which could be atelectasis or consolidation. This was not seen previously. The remaining lungs are clear. There is stable cardiomegaly without CHF. The mediastinal configuration is normal. There is calcification in the transverse aorta. There is osteopenia with thoracic spondylosis. No other displaced rib fractures. IMPRESSION: 1. Acute or at least recent displaced fractures of the lateral right sixth, seventh and eighth ribs, with adjacent airspace disease in the lateral right lower lung field which is probably a pulmonary contusion, and a small quantity of pleural fluid underlying. No measurable pneumothorax. 2. Increased opacity in the left lung base which could be atelectasis or consolidation. Low inspiration on exam. 3. Stable cardiomegaly. 4. Aortic atherosclerosis. Electronically Signed   By: Almira Bar M.D.   On: 10/06/2023 07:41   Today   Subjective    Rosalin Hawking today has no new complaints  -Voiding well -Ambulating to bathroom without significant dyspnea on exertion-O2 sats 95% on 2 to 3 L of oxygen No fever  Or chills  -No dyspnea at rest, no significant dyspnea on exertion     Daughter Harriett Sine at bedside      Patient has been seen and examined prior to discharge   Objective   Blood pressure (!) 114/59, pulse 98, temperature 98 F (36.7 C), temperature source Oral, resp. rate 20, height 5\' 8"  (1.727 m), weight 84.4 kg, SpO2 96%.   Intake/Output Summary (Last 24 hours) at 10/08/2023 1340 Last data filed at 10/08/2023 1300 Gross per 24 hour  Intake 1020 ml  Output 2325 ml  Net -1305 ml   Exam Gen:- Awake Alert, no acute distress , no  conversational dyspnea HEENT:- [place].AT, No sclera icterus Ears--HOH Nose- [place] 3L/min Neck-Supple Neck,No JVD,.  Lungs-improved air movement, no wheezing  CV- S1, S2 normal, regular Abd-  +ve B.Sounds, Abd Soft, No tenderness,    Extremity/Skin:-Improved edema,   good pulses Psych-affect is appropriate, oriented x3 Neuro-- Generalized weakness, no new focal deficits, no tremors    Data Review   CBC w Diff:  Lab Results  Component Value Date   WBC 12.8 (H) 10/08/2023   HGB 14.2 10/08/2023   HGB 13.9 05/30/2023   HCT 45.3 10/08/2023   HCT 42.2 05/30/2023   PLT 244 10/08/2023   PLT 208 05/30/2023   LYMPHOPCT 12 10/06/2023   MONOPCT 13 10/06/2023   EOSPCT 1 10/06/2023   BASOPCT 0 10/06/2023   CMP:  Lab Results  Component Value Date   NA 143 10/08/2023   NA 141 05/30/2023   K 3.2 (L) 10/08/2023   CL 101 10/08/2023   CO2 30 10/08/2023   BUN 43 (H) 10/08/2023   BUN 35 (H)  05/30/2023   CREATININE 1.60 (H) 10/08/2023   CREATININE 1.14 09/06/2014   PROT 5.8 (L) 02/16/2023   PROT 6.6 07/16/2021   ALBUMIN 3.3 (L) 10/08/2023   ALBUMIN 4.1 07/16/2021   BILITOT 0.8 02/16/2023   BILITOT 0.5 07/16/2021   ALKPHOS 81 02/16/2023   AST 10 (L) 02/16/2023   ALT 13 02/16/2023   Total Discharge time is about 33 minutes  Shon Hale M.D on 10/08/2023 at 1:40 PM  Go to [email or web address] -  for contact info  [facility] - Office  [phone]

## 2023-10-08 NOTE — TOC Transition Note (Signed)
Transition of Care [facility]) - Discharge Note   Patient Details  Name: Gerald Hall MRN: [id number] Date of Birth: [birth year]  [facility] CM/SW Contact:  [name], LCSWA Phone Number: 10/08/2023, 11:07 AM  Clinical Narrative:    CSW updated that pt is medically stable for D/C. CSW updated Clydie Braun with [facility] of plan for D/C home today. HH PT orders in place. TOC signing off.   Final next level of care: Home w Home Health Services Barriers to Discharge: Barriers Resolved   Patient Goals and CMS Choice Patient states their goals for this hospitalization and ongoing recovery are:: return home with [facility] CMS Medicare.gov Compare Post Acute Care list provided to:: Patient Choice offered to / list presented to : Patient, Adult Children      Discharge Placement                       Discharge Plan and Services Additional resources added to the After Visit Summary for   In-house Referral: Clinical Social Work Discharge Planning Services: CM Consult Post Acute Care Choice: Home Health                    HH Arranged: PT [facility] Agency: [facility] Date [facility] Agency Contacted: 10/08/23   Representative spoke with at [facility] Agency: Clydie Braun  Social Drivers of Health (SDOH) Interventions SDOH Screenings   Food Insecurity: No Food Insecurity (10/06/2023)  Housing: Low Risk  (10/06/2023)  Transportation Needs: No Transportation Needs (10/06/2023)  Utilities: Not At Risk (10/06/2023)  Alcohol Screen: Low Risk  (03/16/2022)  Depression (PHQ2-9): Low Risk  (09/05/2023)  Financial Resource Strain: Low Risk  (02/28/2023)   Received from [facility], [facility]  Physical Activity: Inactive (02/28/2023)   Received from [facility], [facility]  Social Connections: Moderately Isolated (02/28/2023)   Received from [facility], [facility]  Stress: Stress Concern Present (02/28/2023)   Received from [facility], [facility]   Tobacco Use: Medium Risk (10/06/2023)  Health Literacy: Low Risk  (03/11/2023)   Received from [facility], [facility]     Readmission Risk Interventions     No data to display

## 2023-10-08 NOTE — Discharge Instructions (Signed)
1)- Take  additional 1 mg of Requip/Ropinirole during the day as needed for restless leg -- Take 2 mg at bedtime,  2)Take Torsemide/Demadex-----Take 1 tablet (20 mg) daily thru Wednesday 10/12/2023 inclusive, then go back to 10 mg daily after that  3)Very low-salt diet advised--- less than 2 g of sodium per day advised  4)Weigh yourself daily, call if you gain more than 3 pounds in 1 day or more than 5 pounds in 1 week as your diuretic medications may need to be adjusted  5)you need oxygen at home at 2 to 3 L via nasal cannula continuously while awake and while asleep--- smoking or having open fires around oxygen can cause fire, significant injury and death

## 2023-10-12 ENCOUNTER — Other Ambulatory Visit: Payer: Self-pay | Admitting: Family Medicine

## 2023-10-12 ENCOUNTER — Ambulatory Visit (INDEPENDENT_AMBULATORY_CARE_PROVIDER_SITE_OTHER): Payer: Medicare Other | Admitting: Family Medicine

## 2023-10-12 VITALS — BP 109/71 | HR 90 | Temp 97.9°F | Ht 68.0 in

## 2023-10-12 DIAGNOSIS — K118 Other diseases of salivary glands: Secondary | ICD-10-CM

## 2023-10-12 DIAGNOSIS — J90 Pleural effusion, not elsewhere classified: Secondary | ICD-10-CM | POA: Diagnosis not present

## 2023-10-12 DIAGNOSIS — R21 Rash and other nonspecific skin eruption: Secondary | ICD-10-CM

## 2023-10-12 DIAGNOSIS — D72829 Elevated white blood cell count, unspecified: Secondary | ICD-10-CM | POA: Diagnosis not present

## 2023-10-12 DIAGNOSIS — E876 Hypokalemia: Secondary | ICD-10-CM | POA: Diagnosis not present

## 2023-10-12 NOTE — Progress Notes (Signed)
Subjective:    Patient ID: Gerald Hall, male    DOB: [birth year], 88 y.o.   MRN: [id number]  Discussed the use of AI scribe software for clinical note transcription with the patient, who gave verbal consent to proceed. Patient recently sustained a fall when he was getting out of chair his legs got weak he fell forward struck his head  He also relates at times feeling tight in his chest in regards to not able to take a good deep breath History of Present Illness   The patient, with a history of multiple falls, presents with a recent fall resulting in a head injury and loss of teeth. The fall occurred while the patient was attempting to stand up, and although it did not result in a loss of consciousness, it caused significant bleeding. The patient was not alone at the time of the fall.  Since the fall, the patient reports feeling weak and experiencing increased shortness of breath, particularly during exertion. The patient denies any pain during breathing but notes difficulty in catching a breath. The patient's oxygen levels have been observed to drop, even at rest. Despite these issues, the patient reports a good appetite and no changes in dietary habits.  The patient also reports experiencing chest pains, primarily when lying in bed. The pain is not described as sharp. The patient has been using an oxygen meter at home to monitor oxygen levels.  The patient has also lost a phone and a set of dentures during a recent hospital stay. The patient reports frustration with these losses and the circumstances surrounding them.  The patient has a history of broken ribs and a nodule in the parotid gland. The patient is aware of the nodule and is open to further investigation, including a potential biopsy. The patient also has fluid accumulation between the lung and the chest wall, which could be contributing to the breathing difficulties.  The patient has been experiencing sleep disturbances and has  been taking trazodone before bed, which has resulted in two good nights of sleep. The patient also reports taking naps during the day. The patient has expressed a willingness to consider changes to the sleep medication regimen due to the risk of falls.  The patient's potassium levels have been fluctuating, and the patient is open to further monitoring. The patient is looking forward to spending the upcoming holidays with family.         Review of Systems     Objective:    Physical Exam        General-in no acute distress Eyes-no discharge Lungs-respiratory rate normal, CTA CV-no murmurs,RRR Extremities skin warm dry mild edema lower leg Neuro grossly normal Behavior normal, alert        Assessment & Plan:  Assessment and Plan    Falls Multiple falls with associated injuries including facial laceration and bruising. Possible medication-related drowsiness contributing to falls. -Discontinue Benadryl due to potential contribution to falls. -Consider discontinuing Trazodone if it is causing excessive drowsiness. -Recommend use of assistive devices and supervision during ambulation to prevent further falls.  Shortness of Breath Increased shortness of breath, possibly related to pleural effusion identified on imaging. -Consult with interventional radiologist regarding potential thoracentesis to alleviate symptoms.  Parotid Gland Nodule Nodule identified in the right parotid gland on imaging. Unclear if this is contributing to any symptoms at this time. -Consult with otolaryngologist (ENT) to discuss potential follow-up imaging or biopsy.  Skin Condition Unspecified skin condition, possibly related to recent  falls. -Refer to dermatology for evaluation and treatment.  Hypokalemia Potassium levels borderline low. -Repeat blood draw to check potassium levels.  Follow-up Schedule follow-up appointment in one month (November 16, 2023).      1. Hypokalemia (Primary) Check  lab work await results - Basic Metabolic Panel  2. Pleural effusion Connect with interventional radiology to see if they would consider drawing up fluid on the right side to improve his breathing  3. Rash Right lower leg consult with dermatology patient has tried triamcinolone - Ambulatory referral to Dermatology  4. Parotid mass Patient does not want to do anything at his age we will connect with ENT to see if they recommend doing a follow-up scan in 3 to 6 months or if they feel that this could needs to go ahead and be seen  5. Leukocytosis, unspecified type White blood count mildly elevated on previous labs recheck labs could be related to his history of smoking could be early CLL - CBC with Differential

## 2023-10-13 LAB — BASIC METABOLIC PANEL
BUN/Creatinine Ratio: 24 (ref 10–24)
BUN: 35 mg/dL — ABNORMAL HIGH (ref 8–27)
CO2: 28 mmol/L (ref 20–29)
Calcium: 9.1 mg/dL (ref 8.6–10.2)
Chloride: 99 mmol/L (ref 96–106)
Creatinine, Ser: 1.43 mg/dL — ABNORMAL HIGH (ref 0.76–1.27)
Glucose: 89 mg/dL (ref 70–99)
Potassium: 4 mmol/L (ref 3.5–5.2)
Sodium: 141 mmol/L (ref 134–144)
eGFR: 48 mL/min/{1.73_m2} — ABNORMAL LOW (ref >59)

## 2023-10-13 LAB — CBC WITH DIFFERENTIAL/PLATELET
Basophils Absolute: 0 10*3/uL (ref 0.0–0.2)
Basos: 0 %
EOS (ABSOLUTE): 0.1 10*3/uL (ref 0.0–0.4)
Eos: 1 %
Hematocrit: 39.9 % (ref 37.5–51.0)
Hemoglobin: 12.9 g/dL — ABNORMAL LOW (ref 13.0–17.7)
Immature Grans (Abs): 0.1 10*3/uL (ref 0.0–0.1)
Immature Granulocytes: 1 %
Lymphocytes Absolute: 1.2 10*3/uL (ref 0.7–3.1)
Lymphs: 13 %
MCH: 30 pg (ref 26.6–33.0)
MCHC: 32.3 g/dL (ref 31.5–35.7)
MCV: 93 fL (ref 79–97)
Monocytes Absolute: 1.3 10*3/uL — ABNORMAL HIGH (ref 0.1–0.9)
Monocytes: 13 %
Neutrophils Absolute: 7 10*3/uL (ref 1.4–7.0)
Neutrophils: 72 %
Platelets: 224 10*3/uL (ref 150–450)
RBC: 4.3 x10E6/uL (ref 4.14–5.80)
RDW: 15.1 % (ref 11.6–15.4)
WBC: 9.8 10*3/uL (ref 3.4–10.8)

## 2023-10-14 DIAGNOSIS — J9611 Chronic respiratory failure with hypoxia: Secondary | ICD-10-CM | POA: Diagnosis not present

## 2023-10-14 DIAGNOSIS — Z7951 Long term (current) use of inhaled steroids: Secondary | ICD-10-CM | POA: Diagnosis not present

## 2023-10-14 DIAGNOSIS — Z9981 Dependence on supplemental oxygen: Secondary | ICD-10-CM | POA: Diagnosis not present

## 2023-10-14 DIAGNOSIS — Z96642 Presence of left artificial hip joint: Secondary | ICD-10-CM | POA: Diagnosis not present

## 2023-10-14 DIAGNOSIS — J432 Centrilobular emphysema: Secondary | ICD-10-CM | POA: Diagnosis not present

## 2023-10-14 DIAGNOSIS — S72002D Fracture of unspecified part of neck of left femur, subsequent encounter for closed fracture with routine healing: Secondary | ICD-10-CM | POA: Diagnosis not present

## 2023-10-14 DIAGNOSIS — M4802 Spinal stenosis, cervical region: Secondary | ICD-10-CM | POA: Diagnosis not present

## 2023-10-14 DIAGNOSIS — E876 Hypokalemia: Secondary | ICD-10-CM | POA: Diagnosis not present

## 2023-10-14 DIAGNOSIS — F17210 Nicotine dependence, cigarettes, uncomplicated: Secondary | ICD-10-CM | POA: Diagnosis not present

## 2023-10-14 DIAGNOSIS — Z7984 Long term (current) use of oral hypoglycemic drugs: Secondary | ICD-10-CM | POA: Diagnosis not present

## 2023-10-14 DIAGNOSIS — Z9181 History of falling: Secondary | ICD-10-CM | POA: Diagnosis not present

## 2023-10-14 DIAGNOSIS — M159 Polyosteoarthritis, unspecified: Secondary | ICD-10-CM | POA: Diagnosis not present

## 2023-10-15 IMAGING — DX DG CHEST 2V
2 series · 2 of 2 positions shown · non-contrast
Comparison: Chest x-ray 04/17/2019.

CLINICAL DATA: Shortness of Breath

EXAM:
CHEST - 2 VIEW

[chest pa]
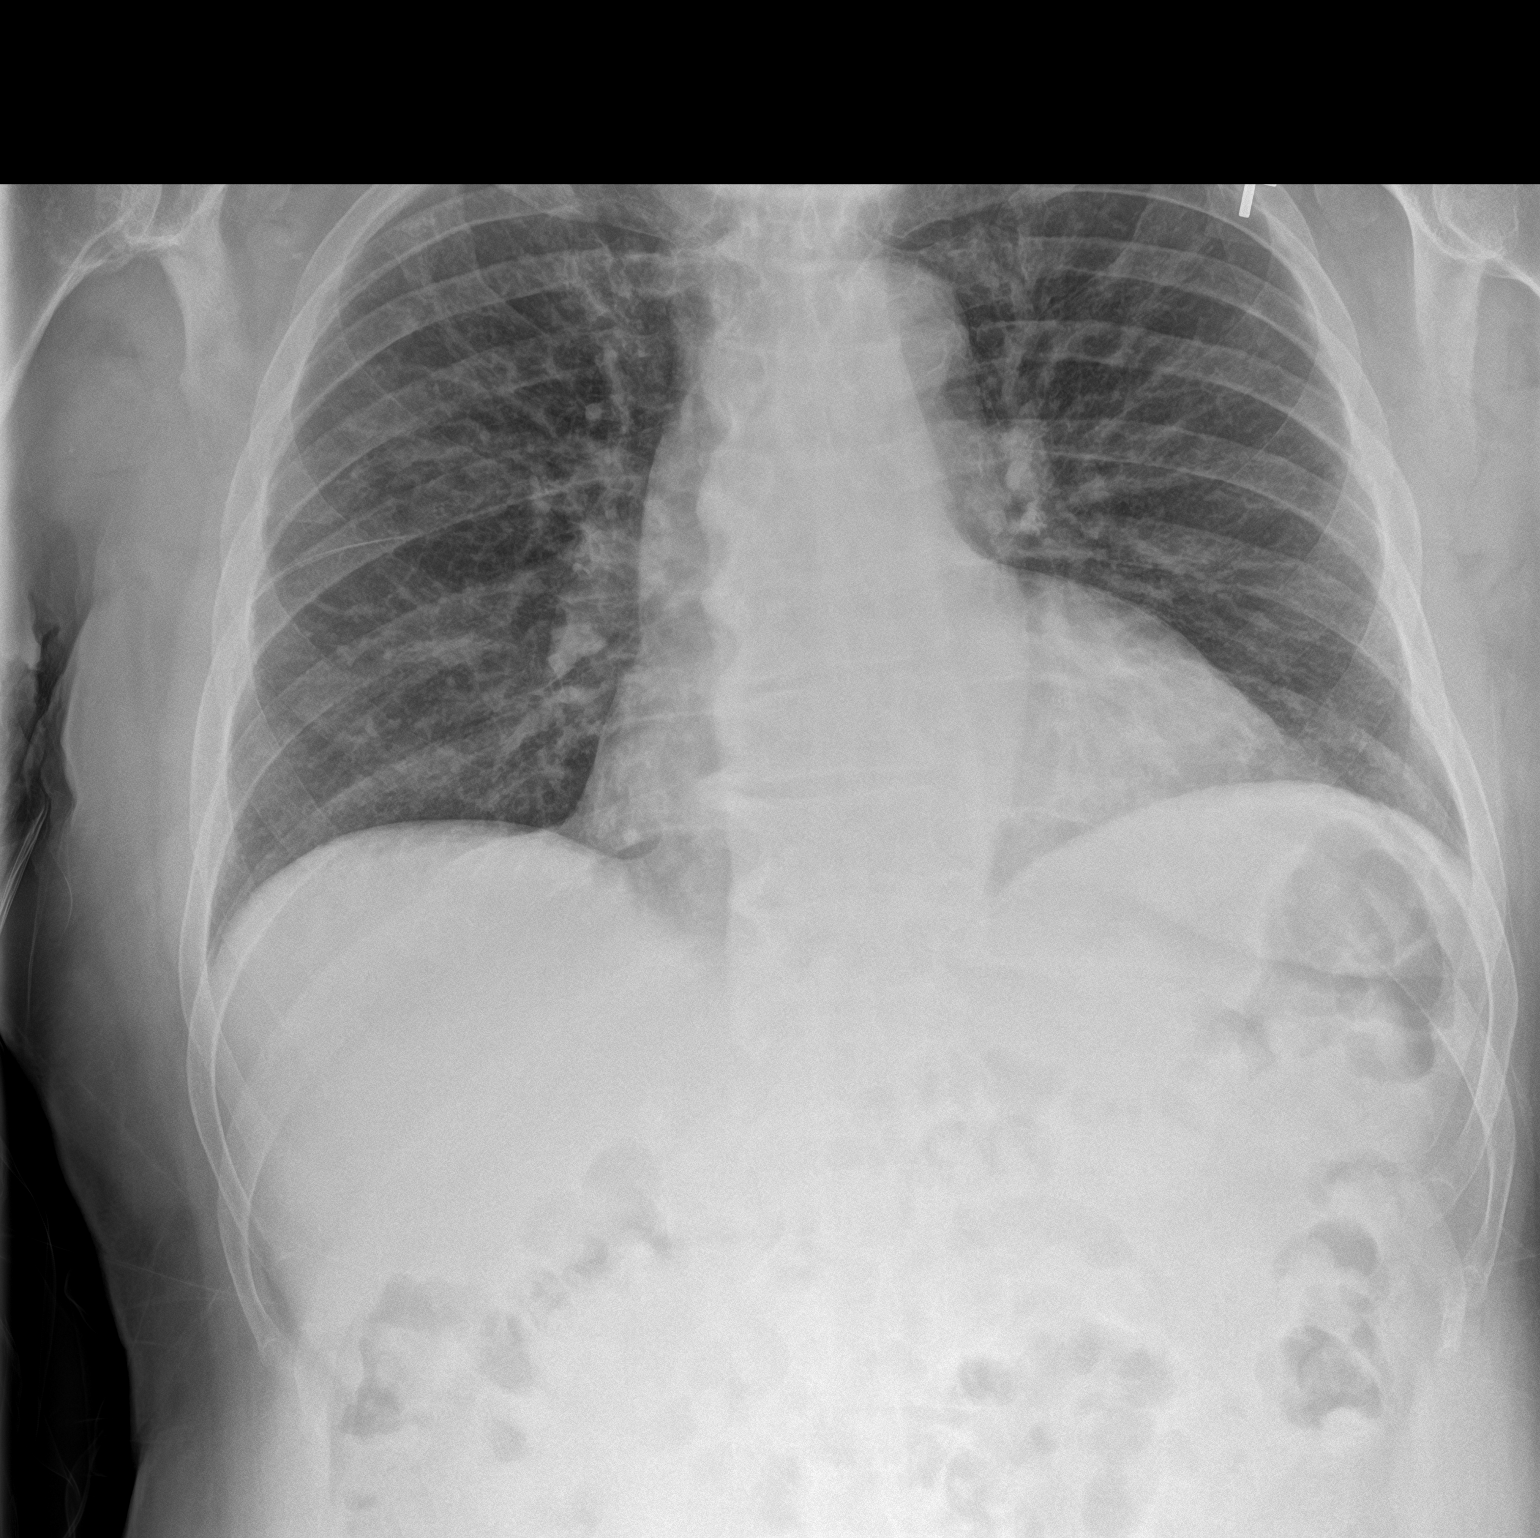

[chest lat]
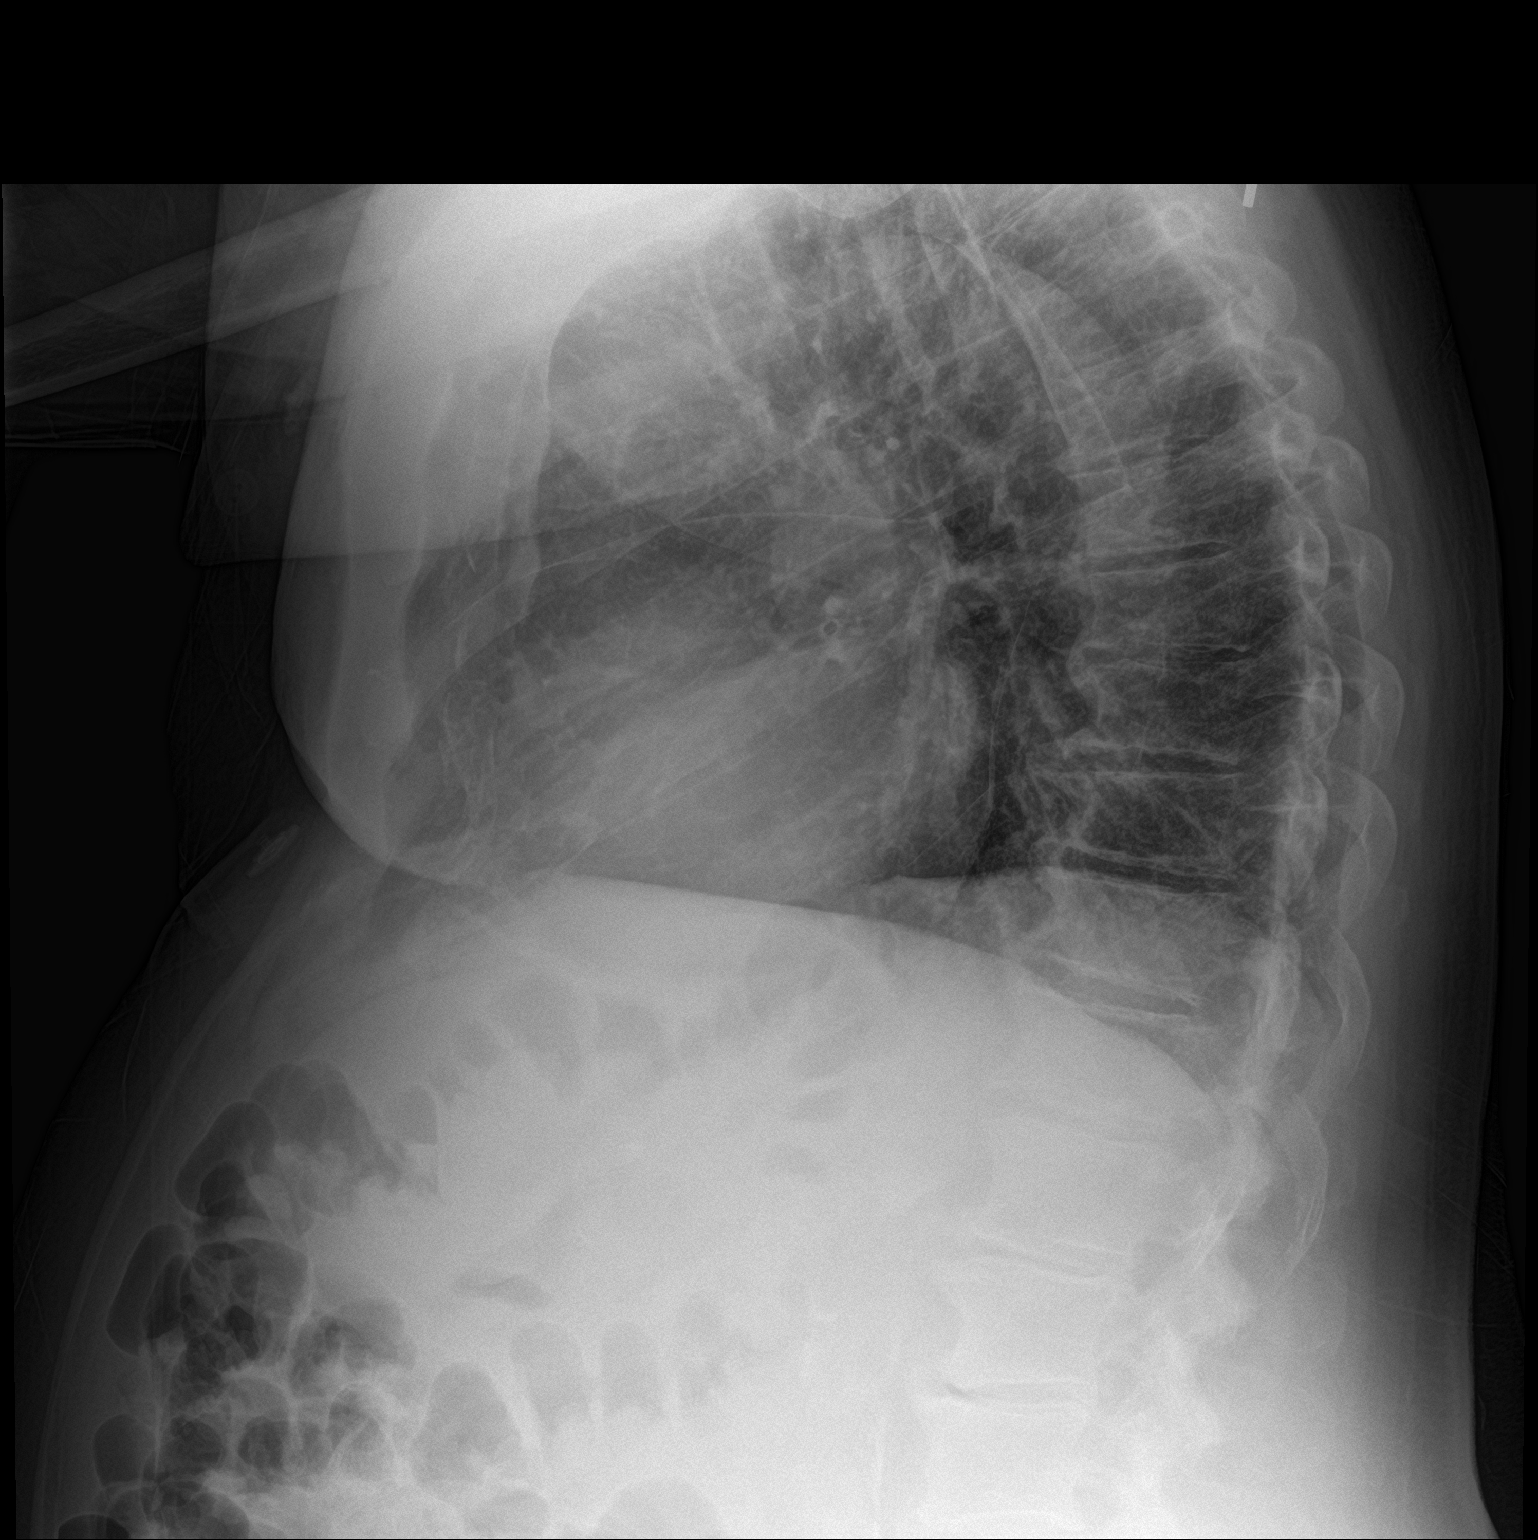

[2 of 2 positions shown; findings below may reference images not displayed]

FINDINGS: The heart and mediastinal contours are unchanged.  Aortic calcific

No focal consolidation. No pulmonary edema. No pleural effusion. No
pneumothorax.

No acute osseous abnormality. Multilevel degenerative changes spine.
IMPRESSION: No active cardiopulmonary disease.

## 2023-10-16 ENCOUNTER — Telehealth: Payer: Self-pay | Admitting: Family Medicine

## 2023-10-16 DIAGNOSIS — J90 Pleural effusion, not elsewhere classified: Secondary | ICD-10-CM

## 2023-10-16 NOTE — Telephone Encounter (Signed)
Nurses Please put in consultation for interventional radiology consult for right pleural effusion and dyspnea on exertion Please see if they can do a pleurocentesis to remove the fluid  Interventional radiology would need to order all of the pleural effusion protein, LDH, cell count with differential, culture, cytology, pH  Diagnosis right pleural effusion and dyspnea on exertion

## 2023-10-17 NOTE — Telephone Encounter (Signed)
Referral ordered in EPIC. 

## 2023-10-18 ENCOUNTER — Other Ambulatory Visit (HOSPITAL_COMMUNITY): Payer: Self-pay | Admitting: Family Medicine

## 2023-10-18 ENCOUNTER — Telehealth: Payer: Self-pay | Admitting: Family Medicine

## 2023-10-18 DIAGNOSIS — R0609 Other forms of dyspnea: Secondary | ICD-10-CM

## 2023-10-18 DIAGNOSIS — J90 Pleural effusion, not elsewhere classified: Secondary | ICD-10-CM

## 2023-10-18 NOTE — Telephone Encounter (Signed)
Nurses This week please call patient to let him know that interventional radiology will be reaching out to him to schedule thoracentesis-essentially taking some fluid off the right lung as we discussed at his last office visit.

## 2023-10-21 ENCOUNTER — Ambulatory Visit (HOSPITAL_COMMUNITY)
Admission: RE | Admit: 2023-10-21 | Discharge: 2023-10-21 | Disposition: A | Discharge status: Home / Self Care | Payer: Medicare Other | Source: Ambulatory Visit | Attending: Family Medicine | Admitting: Family Medicine

## 2023-10-21 ENCOUNTER — Other Ambulatory Visit: Payer: Self-pay | Admitting: Family Medicine

## 2023-10-21 ENCOUNTER — Ambulatory Visit (HOSPITAL_COMMUNITY)
Admission: RE | Admit: 2023-10-21 | Discharge: 2023-10-21 | Disposition: A | Discharge status: Home / Self Care | Payer: Medicare Other | Source: Ambulatory Visit | Attending: Interventional Radiology | Admitting: Interventional Radiology

## 2023-10-21 ENCOUNTER — Encounter (HOSPITAL_COMMUNITY): Payer: Self-pay

## 2023-10-21 DIAGNOSIS — R0609 Other forms of dyspnea: Secondary | ICD-10-CM | POA: Diagnosis not present

## 2023-10-21 DIAGNOSIS — Z48813 Encounter for surgical aftercare following surgery on the respiratory system: Secondary | ICD-10-CM | POA: Diagnosis not present

## 2023-10-21 DIAGNOSIS — I517 Cardiomegaly: Secondary | ICD-10-CM | POA: Diagnosis not present

## 2023-10-21 DIAGNOSIS — J90 Pleural effusion, not elsewhere classified: Secondary | ICD-10-CM | POA: Diagnosis not present

## 2023-10-21 DIAGNOSIS — R6889 Other general symptoms and signs: Secondary | ICD-10-CM | POA: Diagnosis not present

## 2023-10-21 DIAGNOSIS — R404 Transient alteration of awareness: Secondary | ICD-10-CM | POA: Diagnosis not present

## 2023-10-21 DIAGNOSIS — R55 Syncope and collapse: Secondary | ICD-10-CM | POA: Diagnosis not present

## 2023-10-21 DIAGNOSIS — R848 Other abnormal findings in specimens from respiratory organs and thorax: Secondary | ICD-10-CM | POA: Diagnosis not present

## 2023-10-21 DIAGNOSIS — I499 Cardiac arrhythmia, unspecified: Secondary | ICD-10-CM | POA: Diagnosis not present

## 2023-10-21 LAB — BODY FLUID CELL COUNT WITH DIFFERENTIAL
Eos, Fluid: 0 %
Lymphs, Fluid: 92 %
Monocyte-Macrophage-Serous Fluid: 5 % — ABNORMAL LOW (ref 50–90)
Neutrophil Count, Fluid: 3 % (ref 0–25)
Total Nucleated Cell Count, Fluid: 1877 uL — ABNORMAL HIGH (ref 0–1000)

## 2023-10-21 LAB — PROTEIN, PLEURAL OR PERITONEAL FLUID: Total protein, fluid: 3.3 g/dL

## 2023-10-21 LAB — LACTATE DEHYDROGENASE, PLEURAL OR PERITONEAL FLUID: LD, Fluid: 134 U/L — ABNORMAL HIGH (ref 3–23)

## 2023-10-22 LAB — GRAM STAIN

## 2023-10-26 LAB — CULTURE, BODY FLUID W GRAM STAIN -BOTTLE: Culture: NO GROWTH

## 2023-10-26 NOTE — Progress Notes (Signed)
Patient tolerated right sided thoracentesis procedure well today and 550 mL of serosanguineous pleural fluid removed and sent to lab for processing. Patient transported via wheelchair to xray department at this time for post chest xray with no acute distress noted. Patient verbalized understanding of discharge instructions.

## 2023-10-26 DEATH — deceased

## 2023-10-28 LAB — CYTOLOGY - NON PAP

## 2023-11-16 ENCOUNTER — Ambulatory Visit: Payer: Medicare Other | Admitting: Family Medicine
